# Patient Record
Sex: Female | Born: 1935
Health system: Southern US, Community
[De-identification: ages and names within clinical notes are randomized; demographics above are authoritative.]

## PROBLEM LIST (undated history)

## (undated) DIAGNOSIS — E785 Hyperlipidemia, unspecified: Secondary | ICD-10-CM

## (undated) DIAGNOSIS — M797 Fibromyalgia: Secondary | ICD-10-CM

## (undated) DIAGNOSIS — R519 Headache, unspecified: Secondary | ICD-10-CM

## (undated) DIAGNOSIS — F32A Depression, unspecified: Secondary | ICD-10-CM

## (undated) DIAGNOSIS — B019 Varicella without complication: Secondary | ICD-10-CM

## (undated) DIAGNOSIS — B059 Measles without complication: Secondary | ICD-10-CM

## (undated) DIAGNOSIS — E1142 Type 2 diabetes mellitus with diabetic polyneuropathy: Secondary | ICD-10-CM

## (undated) DIAGNOSIS — E1110 Type 2 diabetes mellitus with ketoacidosis without coma: Secondary | ICD-10-CM

## (undated) DIAGNOSIS — G894 Chronic pain syndrome: Secondary | ICD-10-CM

## (undated) DIAGNOSIS — T4145XA Adverse effect of unspecified anesthetic, initial encounter: Secondary | ICD-10-CM

## (undated) DIAGNOSIS — M1990 Unspecified osteoarthritis, unspecified site: Secondary | ICD-10-CM

## (undated) DIAGNOSIS — G629 Polyneuropathy, unspecified: Secondary | ICD-10-CM

## (undated) DIAGNOSIS — F329 Major depressive disorder, single episode, unspecified: Secondary | ICD-10-CM

## (undated) DIAGNOSIS — R0602 Shortness of breath: Secondary | ICD-10-CM

## (undated) DIAGNOSIS — E782 Mixed hyperlipidemia: Secondary | ICD-10-CM

## (undated) DIAGNOSIS — K219 Gastro-esophageal reflux disease without esophagitis: Secondary | ICD-10-CM

## (undated) DIAGNOSIS — M79644 Pain in right finger(s): Secondary | ICD-10-CM

## (undated) DIAGNOSIS — T8859XA Other complications of anesthesia, initial encounter: Secondary | ICD-10-CM

## (undated) DIAGNOSIS — C189 Malignant neoplasm of colon, unspecified: Secondary | ICD-10-CM

## (undated) DIAGNOSIS — I10 Essential (primary) hypertension: Secondary | ICD-10-CM

## (undated) DIAGNOSIS — T7840XA Allergy, unspecified, initial encounter: Secondary | ICD-10-CM

## (undated) DIAGNOSIS — Z9289 Personal history of other medical treatment: Secondary | ICD-10-CM

## (undated) DIAGNOSIS — R51 Headache: Secondary | ICD-10-CM

## (undated) DIAGNOSIS — M25511 Pain in right shoulder: Secondary | ICD-10-CM

## (undated) HISTORY — DX: Allergy, unspecified, initial encounter: T78.40XA

## (undated) HISTORY — DX: Measles without complication: B05.9

## (undated) HISTORY — DX: Unspecified osteoarthritis, unspecified site: M19.90

## (undated) HISTORY — DX: Malignant neoplasm of colon, unspecified: C18.9

## (undated) HISTORY — PX: COLON SURGERY: SHX602

## (undated) HISTORY — DX: Type 2 diabetes mellitus with ketoacidosis without coma: E11.10

## (undated) HISTORY — DX: Chronic pain syndrome: G89.4

## (undated) HISTORY — DX: Mixed hyperlipidemia: E78.2

## (undated) HISTORY — DX: Pain in right finger(s): M79.644

## (undated) HISTORY — DX: Fibromyalgia: M79.7

## (undated) HISTORY — DX: Varicella without complication: B01.9

## (undated) HISTORY — DX: Polyneuropathy, unspecified: G62.9

## (undated) HISTORY — DX: Pain in right shoulder: M25.511

## (undated) HISTORY — DX: Essential (primary) hypertension: I10

## (undated) HISTORY — DX: Type 2 diabetes mellitus with diabetic polyneuropathy: E11.42

## (undated) HISTORY — PX: CATARACT EXTRACTION: SUR2

## (undated) HISTORY — DX: Headache, unspecified: R51.9

## (undated) HISTORY — DX: Hyperlipidemia, unspecified: E78.5

## (undated) HISTORY — PX: OTHER SURGICAL HISTORY: SHX169

---

## 1998-06-15 ENCOUNTER — Other Ambulatory Visit: Admission: RE | Admit: 1998-06-15 | Discharge: 1998-06-15 | Payer: Self-pay | Admitting: Obstetrics and Gynecology

## 2000-01-01 LAB — HM PAP SMEAR: HM Pap smear: NORMAL

## 2000-08-01 ENCOUNTER — Encounter: Admission: RE | Admit: 2000-08-01 | Discharge: 2000-08-01 | Payer: Self-pay | Admitting: Internal Medicine

## 2000-08-01 ENCOUNTER — Encounter: Payer: Self-pay | Admitting: Internal Medicine

## 2001-08-06 ENCOUNTER — Encounter: Admission: RE | Admit: 2001-08-06 | Discharge: 2001-08-06 | Payer: Self-pay | Admitting: Internal Medicine

## 2001-08-06 ENCOUNTER — Encounter: Payer: Self-pay | Admitting: Internal Medicine

## 2001-08-25 ENCOUNTER — Other Ambulatory Visit: Admission: RE | Admit: 2001-08-25 | Discharge: 2001-08-25 | Payer: Self-pay | Admitting: Internal Medicine

## 2001-11-05 ENCOUNTER — Ambulatory Visit (HOSPITAL_COMMUNITY): Admission: RE | Admit: 2001-11-05 | Discharge: 2001-11-05 | Payer: Self-pay | Admitting: Gastroenterology

## 2002-08-10 ENCOUNTER — Encounter: Admission: RE | Admit: 2002-08-10 | Discharge: 2002-08-10 | Payer: Self-pay | Admitting: Internal Medicine

## 2002-08-10 ENCOUNTER — Encounter: Payer: Self-pay | Admitting: Internal Medicine

## 2002-08-18 ENCOUNTER — Encounter: Admission: RE | Admit: 2002-08-18 | Discharge: 2002-08-18 | Payer: Self-pay | Admitting: Internal Medicine

## 2002-08-18 ENCOUNTER — Encounter: Payer: Self-pay | Admitting: Internal Medicine

## 2003-08-12 ENCOUNTER — Encounter: Payer: Self-pay | Admitting: Internal Medicine

## 2003-08-12 ENCOUNTER — Encounter: Admission: RE | Admit: 2003-08-12 | Discharge: 2003-08-12 | Payer: Self-pay | Admitting: Internal Medicine

## 2004-08-17 ENCOUNTER — Encounter: Admission: RE | Admit: 2004-08-17 | Discharge: 2004-08-17 | Payer: Self-pay | Admitting: Internal Medicine

## 2005-08-23 ENCOUNTER — Encounter: Admission: RE | Admit: 2005-08-23 | Discharge: 2005-08-23 | Payer: Self-pay | Admitting: Family Medicine

## 2006-08-26 ENCOUNTER — Encounter: Admission: RE | Admit: 2006-08-26 | Discharge: 2006-08-26 | Payer: Self-pay | Admitting: Family Medicine

## 2007-08-28 ENCOUNTER — Encounter: Admission: RE | Admit: 2007-08-28 | Discharge: 2007-08-28 | Payer: Self-pay | Admitting: Family Medicine

## 2008-08-30 ENCOUNTER — Encounter: Admission: RE | Admit: 2008-08-30 | Discharge: 2008-08-30 | Payer: Self-pay | Admitting: Family Medicine

## 2009-08-10 ENCOUNTER — Encounter: Admission: RE | Admit: 2009-08-10 | Discharge: 2009-09-29 | Payer: Self-pay | Admitting: Family Medicine

## 2009-08-31 ENCOUNTER — Encounter: Admission: RE | Admit: 2009-08-31 | Discharge: 2009-08-31 | Payer: Self-pay | Admitting: Family Medicine

## 2009-09-07 ENCOUNTER — Encounter: Admission: RE | Admit: 2009-09-07 | Discharge: 2009-09-07 | Payer: Self-pay | Admitting: Family Medicine

## 2010-04-26 ENCOUNTER — Encounter: Payer: Self-pay | Admitting: Cardiology

## 2010-05-31 ENCOUNTER — Ambulatory Visit: Payer: Self-pay | Admitting: Cardiology

## 2010-05-31 DIAGNOSIS — I10 Essential (primary) hypertension: Secondary | ICD-10-CM

## 2010-05-31 DIAGNOSIS — R000 Tachycardia, unspecified: Secondary | ICD-10-CM

## 2010-05-31 DIAGNOSIS — R0602 Shortness of breath: Secondary | ICD-10-CM

## 2010-05-31 HISTORY — DX: Tachycardia, unspecified: R00.0

## 2010-05-31 HISTORY — DX: Essential (primary) hypertension: I10

## 2010-05-31 HISTORY — DX: Shortness of breath: R06.02

## 2010-06-20 ENCOUNTER — Encounter: Payer: Self-pay | Admitting: Cardiology

## 2010-06-20 ENCOUNTER — Ambulatory Visit: Payer: Self-pay | Admitting: Cardiology

## 2010-06-20 ENCOUNTER — Ambulatory Visit (HOSPITAL_COMMUNITY): Admission: RE | Admit: 2010-06-20 | Discharge: 2010-06-20 | Payer: Self-pay | Admitting: Cardiology

## 2010-06-20 ENCOUNTER — Ambulatory Visit: Payer: Self-pay | Admitting: Internal Medicine

## 2010-06-20 ENCOUNTER — Ambulatory Visit: Payer: Self-pay

## 2010-06-21 LAB — CONVERTED CEMR LAB: TSH: 2.07 microintl units/mL (ref 0.35–5.50)

## 2010-07-05 ENCOUNTER — Ambulatory Visit: Payer: Self-pay | Admitting: Cardiology

## 2010-09-12 ENCOUNTER — Encounter: Admission: RE | Admit: 2010-09-12 | Discharge: 2010-09-12 | Payer: Self-pay | Admitting: Family Medicine

## 2010-09-18 ENCOUNTER — Encounter: Admission: RE | Admit: 2010-09-18 | Discharge: 2010-09-18 | Payer: Self-pay | Admitting: Family Medicine

## 2010-09-20 ENCOUNTER — Encounter: Admission: RE | Admit: 2010-09-20 | Discharge: 2010-09-20 | Payer: Self-pay | Admitting: Family Medicine

## 2011-01-20 ENCOUNTER — Other Ambulatory Visit: Payer: Self-pay | Admitting: Family Medicine

## 2011-01-20 DIAGNOSIS — N6310 Unspecified lump in the right breast, unspecified quadrant: Secondary | ICD-10-CM

## 2011-01-22 ENCOUNTER — Encounter: Payer: Self-pay | Admitting: Family Medicine

## 2011-02-01 NOTE — Assessment & Plan Note (Signed)
Summary: [facility]   Visit Type:  Follow-up Primary Provider:  Belva Agee, NP  CC:  SOB and Chest Pain.  History of Present Illness: The patient presents for followup of dyspnea. This is described in the previous note. She was having episodes of dyspnea at night. She was describing a resting heart rate that was elevated and some tachycardia palpitations. She is no longer having shortness of breath at night. She has a rapid resting heart rate but no symptomatic tachycardia palpitations, presyncope or syncope. She is not describing PND or orthopnea. She does get some chest discomfort but this on today's description seems to be a chronic problem she has had for years with some burning in her upper epigastric area particularly at night. She has refused further cardiac workup. She did allow me to perform a TSH which was normal. I ordered an echocardiogram which demonstrated some mild diastolic dysfunction but normal systolic function. She had no valvular abnormalities.  Current Medications (verified): 1)  Metformin Hcl 500 Mg Tabs (Metformin Hcl) .Marland Kitchen.. 1 By Mouth Two Times A Day 2)  Omeprazole 20 Mg Cpdr (Omeprazole) .Marland Kitchen.. 1 By Mouth Daily 3)  Diovan Hct 160-12.5 Mg Tabs (Valsartan-Hydrochlorothiazide) .Marland Kitchen.. 1 By Mouth Daily 4)  Gabapentin 800 Mg Tabs (Gabapentin) .Marland Kitchen.. 1 By Mouth Three Times A Day 5)  Savella 50 Mg Tabs (Milnacipran Hcl) .Marland Kitchen.. 1 By Mouth Two Times A Day 6)  Valium 5 Mg Tabs (Diazepam) .Marland Kitchen.. 1 By Mouth As Needed 7)  Hydrocodone-Acetaminophen 5-500 Mg Tabs (Hydrocodone-Acetaminophen) .... As Needed  Allergies (verified): 1)  ! Penicillin  Past History:  Past Medical History: Reviewed history from 05/31/2010 and no changes required. Diabetes mellitus - 6 years Hypertension-15 years Hyperlipidemia-6 years Peripheral neuropathy Fibromyalgia Osteoarthritis Colon cancer  Past Surgical History: Reviewed history from 05/31/2010 and no changes required. Cataract  surgery Colon surgery for resection of cancer Nodule removed from her "throat"  Review of Systems       As stated in the HPI and negative for all other systems.   Vital Signs:  Patient profile:   77 year old female Height:      66 inches Weight:      142 pounds BMI:     23.00 Pulse rate:   98 / minute Resp:     18 per minute BP sitting:   156 / 92  (right arm)  Vitals Entered By: Marrion Coy, CNA (July 05, 2010 2:38 PM)  Physical Exam  General:  Well developed, well nourished, in no acute distress. Head:  normocephalic and atraumatic Neck:  Neck supple, no JVD. No masses, thyromegaly or abnormal cervical nodes. Chest Wall:  no deformities or breast masses noted Lungs:  Clear bilaterally to auscultation and percussion. Abdomen:  Bowel sounds positive; abdomen soft and non-tender without masses, organomegaly, or hernias noted. No hepatosplenomegaly. Msk:  Back normal, normal gait. Muscle strength and tone normal. Extremities:  No clubbing or cyanosis. Neurologic:  Alert and oriented x 3. Skin:  Intact without lesions or rashes. Psych:  Normal affect.   Detailed Cardiovascular Exam  Neck    Carotids: Carotids full and equal bilaterally without bruits.      Neck Veins: Normal, no JVD.    Heart    Inspection: no deformities or lifts noted.      Palpation: normal PMI with no thrills palpable.      Auscultation: regular rate and rhythm, S1, S2 without murmurs, rubs, gallops, or clicks.    Vascular  Abdominal Aorta: no palpable masses, pulsations, or audible bruits.      Femoral Pulses: normal femoral pulses bilaterally.      Pedal Pulses: normal pedal pulses bilaterally.      Radial Pulses: normal radial pulses bilaterally.      Peripheral Circulation: no clubbing, cyanosis, or edema noted with normal capillary refill.     Impression & Recommendations:  Problem # 1:  SHORTNESS OF BREATH (ICD-786.05) She is having no new episodic shortness of breath. The above  workup was normal. She does not want an ischemia evaluation. Therefore, no further workup is planned. I will suggest no change in her therapy. She will come back to see me if her symptoms recur or worsen.  Problem # 2:  UNSPECIFIED TACHYCARDIA (ICD-785.0) She has a resting sinus tachycardia but no apparent paroxysmal dysrhythmias. She's not having presyncope or syncope. Her thyroid study was normal. She is in pain and anxious which might account for some of her tachycardia. No further workup is suggested.  Patient Instructions: 1)  Your physician recommends that you schedule a follow-up appointment as needed 2)  Your physician recommends that you continue on your current medications as directed. Please refer to the Current Medication list given to you today.

## 2011-02-01 NOTE — Assessment & Plan Note (Signed)
Summary: [facility]   Visit Type:  Initial Consult Primary Provider:  Belva Agee, NP  CC:  palpitations.  History of Present Illness: The patient presents for evaluation of a rapid heart rate. She was noted recently on EKG to have sinus tachycardia with a heart rate of 124. She's been told for some years and her heart rate was elevated but never this high. She says that she doesn't notice this occasionally. She feel her heart beating fast but does not describe any irregular rhythm. She has not had any presyncope or syncope though she would occasionally be lightheaded. She does some activities such as vacuuming but does notice shortness of breath with this. She has had a slowly progressive fatigue with activity. She does describe 2 episodes of shortness of breath waking her at night. She sleeps with her head slightly elevated for treatment of reflux. She says that on 2 occasions she has woken with dyspnea and is able to weight-bear and let it slowly resolved. She does have some rare chest discomfort that is similar to previous reflux. This is sporadic and not associated with other symptoms. She can't bring this on. There is no associated nausea vomiting or diaphoresis.  Current Medications (verified): 1)  Metformin Hcl 500 Mg Tabs (Metformin Hcl) .Marland Kitchen.. 1 By Mouth Two Times A Day 2)  Omeprazole 20 Mg Cpdr (Omeprazole) .Marland Kitchen.. 1 By Mouth Daily 3)  Diovan Hct 160-12.5 Mg Tabs (Valsartan-Hydrochlorothiazide) .Marland Kitchen.. 1 By Mouth Daily 4)  Gabapentin 800 Mg Tabs (Gabapentin) .Marland Kitchen.. 1 By Mouth Three Times A Day 5)  Savella 50 Mg Tabs (Milnacipran Hcl) .Marland Kitchen.. 1 By Mouth Two Times A Day 6)  Valium 5 Mg Tabs (Diazepam) .Marland Kitchen.. 1 By Mouth As Needed 7)  Hydrocodone-Acetaminophen 5-500 Mg Tabs (Hydrocodone-Acetaminophen) .... As Needed  Allergies (verified): 1)  ! Penicillin  Past History:  Past Medical History: Diabetes mellitus - 6 years Hypertension-15 years Hyperlipidemia-6 years Peripheral  neuropathy Fibromyalgia Osteoarthritis Colon cancer  Past Surgical History: Cataract surgery Colon surgery for resection of cancer Nodule removed from her "throat"  Family History: Her father died at 66 with colorectal cancer, her mother died at 69 with brain cancer. There is no early heart disease history.  Social History: Patient does not smoke cigare She is retired. She is married and has one child.ttes or drink alcohol.  Review of Systems       Positive for headaches, constipation, reflux, urinary frequency. Negative for all other systems.  Vital Signs:  Patient profile:   77 year old female Height:      66 inches Weight:      141 pounds BMI:     22.84 Pulse rate:   108 / minute Resp:     16 per minute BP sitting:   150 / 88  (right arm)  Vitals Entered By: Marrion Coy, CNA (May 31, 2010 11:29 AM)  Physical Exam  General:  Well developed, well nourished, in no acute distress. Head:  normocephalic and atraumatic Eyes:  PERRLA/EOM intact; conjunctiva and lids normal. Mouth:  Oral mucosa normal. Neck:  Neck supple, no JVD. No masses, thyromegaly or abnormal cervical nodes. Chest Wall:  no deformities or breast masses noted Lungs:  Clear bilaterally to auscultation and percussion. Abdomen:  Bowel sounds positive; abdomen soft and non-tender without masses, organomegaly, or hernias noted. No hepatosplenomegaly. Msk:  Back normal, normal gait. Muscle strength and tone normal. Extremities:  No clubbing or cyanosis. Neurologic:  Alert and oriented x 3. Skin:  Intact  without lesions or rashes. Cervical Nodes:  no significant adenopathy Axillary Nodes:  no significant adenopathy Inguinal Nodes:  no significant adenopathy Psych:  Normal affect.   Detailed Cardiovascular Exam  Neck    Carotids: Carotids full and equal bilaterally without bruits.      Neck Veins: Normal, no JVD.    Heart    Inspection: no deformities or lifts noted.      Palpation: normal PMI with  no thrills palpable.      Auscultation: regular rate and rhythm, S1, S2 without murmurs, rubs, gallops, or clicks.    Vascular    Abdominal Aorta: no palpable masses, pulsations, or audible bruits.      Femoral Pulses: normal femoral pulses bilaterally.      Pedal Pulses: normal pedal pulses bilaterally.      Radial Pulses: normal radial pulses bilaterally.      Peripheral Circulation: no clubbing, cyanosis, or edema noted with normal capillary refill.     EKG  Procedure date:  05/31/2010  Findings:      Sinus rhythm, rate 124, right bundle branch block, we'll left anterior fascicular block, no acute ST-T wave changes  Impression & Recommendations:  Problem # 1:  UNSPECIFIED TACHYCARDIA (ICD-785.0) Patient has sinus tachycardia. She does have some symptoms related to this. I'm going to start checking a TSH and an echocardiogram to see if she . Orders: Echocardiogram (Echo)  Problem # 2:  ESSENTIAL HYPERTENSION, BENIGN (ICD-401.1) Her blood pressure is slightly elevated today. However, recent other office readings have been normal and I will make no change her regimen but would encourage home monitoring.  Problem # 3:  SHORTNESS OF BREATH (ICD-786.05) This will be evaluated in the context of evaluating her tachycardia. I will also consider a BNP level based on the results of the echo. Orders: Echocardiogram (Echo)  Patient Instructions: 1)  Your physician recommends that you schedule a follow-up appointment in: after 2 D Echo and TSH 2)  Your physician recommends that you continue on your current medications as directed. Please refer to the Current Medication list given to you today. 3)  Your physician has requested that you have an echocardiogram.  Echocardiography is a painless test that uses sound waves to create images of your heart. It provides your doctor with information about the size and shape of your heart and how well your heart's chambers and valves are working.  This  procedure takes approximately one hour. There are no restrictions for this procedure. 4)  Your physician recommends that you have lab work the same  day as 2 D Echo

## 2011-03-05 ENCOUNTER — Other Ambulatory Visit: Payer: Self-pay | Admitting: Family Medicine

## 2011-03-05 ENCOUNTER — Ambulatory Visit
Admission: RE | Admit: 2011-03-05 | Discharge: 2011-03-05 | Disposition: A | Discharge status: Home / Self Care | Payer: Medicare Other | Source: Ambulatory Visit | Attending: Family Medicine | Admitting: Family Medicine

## 2011-03-05 DIAGNOSIS — N6310 Unspecified lump in the right breast, unspecified quadrant: Secondary | ICD-10-CM

## 2011-03-17 ENCOUNTER — Encounter: Payer: Self-pay | Admitting: *Deleted

## 2011-03-17 DIAGNOSIS — E1142 Type 2 diabetes mellitus with diabetic polyneuropathy: Secondary | ICD-10-CM

## 2011-03-17 DIAGNOSIS — N189 Chronic kidney disease, unspecified: Secondary | ICD-10-CM

## 2011-03-17 DIAGNOSIS — E1110 Type 2 diabetes mellitus with ketoacidosis without coma: Secondary | ICD-10-CM | POA: Insufficient documentation

## 2011-03-17 DIAGNOSIS — Z85038 Personal history of other malignant neoplasm of large intestine: Secondary | ICD-10-CM

## 2011-03-17 DIAGNOSIS — N6099 Unspecified benign mammary dysplasia of unspecified breast: Secondary | ICD-10-CM

## 2011-03-17 DIAGNOSIS — M1990 Unspecified osteoarthritis, unspecified site: Secondary | ICD-10-CM

## 2011-03-17 DIAGNOSIS — E1140 Type 2 diabetes mellitus with diabetic neuropathy, unspecified: Secondary | ICD-10-CM

## 2011-03-17 DIAGNOSIS — K219 Gastro-esophageal reflux disease without esophagitis: Secondary | ICD-10-CM | POA: Insufficient documentation

## 2011-03-17 HISTORY — DX: Personal history of other malignant neoplasm of large intestine: Z85.038

## 2011-03-17 HISTORY — DX: Unspecified benign mammary dysplasia of unspecified breast: N60.99

## 2011-03-17 HISTORY — DX: Chronic kidney disease, unspecified: N18.9

## 2011-03-17 HISTORY — DX: Type 2 diabetes mellitus with diabetic polyneuropathy: E11.42

## 2011-03-17 HISTORY — DX: Type 2 diabetes mellitus with diabetic neuropathy, unspecified: E11.40

## 2011-03-17 HISTORY — DX: Unspecified osteoarthritis, unspecified site: M19.90

## 2011-03-17 HISTORY — DX: Type 2 diabetes mellitus with ketoacidosis without coma: E11.10

## 2011-05-18 NOTE — Procedures (Signed)
[facility]  Patient:    Dana Burns, Dana Burns Visit Number: [id number] MRN: [id number]          Service Type: Attending:  Verlin Grills, M.D. Dictated by:   Verlin Grills, M.D. Proc. Date: 11/05/01   CC:         Soyla Murphy. Renne Crigler, M.D.   Procedure Report  PROCEDURE:  Surveillance colonoscopy.  REFERRING PHYSICIAN:  Soyla Murphy. Renne Crigler, M.D.  INDICATIONS FOR PROCEDURE:  The patient (date of birth, March 27, 1927) is a 77 year old female.  On April 27, 1992, the patient underwent a segmental sigmoid colon resection to remove a Dukes B adenocarcinoma of the colon.  On August 24, 1993, proctocolonoscopy to the hepatic flexure followed by air contrast barium enema performed at [facility] were normal.  On November 10, 1996, flexible proctosigmoidoscopy followed by air contrast barium enema at the [facility] were normal.  ENDOSCOPIST:  Verlin Grills, M.D.  PREMEDICATION:  Versed 10 mg and Demerol 50 mg.  ENDOSCOPE:  Olympus pediatric colonoscope.  DESCRIPTION OF PROCEDURE:  After obtaining informed consent, the patient was placed in the left lateral decubitus position.  I administered intravenous Versed and intravenous Demerol to achieve conscious sedation for the procedure.  The patients blood pressure, oxygen saturation, and cardiac rhythm were monitored throughout the procedure and documented in the medical record.  Anal inspection was normal.  Digital rectal exam was normal.  The Olympus pediatric video colonoscope was introduced into the rectum and advanced to the cecum.  In order to advance the endoscope to the cecum, the patient was initially placed in the left lateral decubitus position, rolled to the supine position, and finally to the right lateral decubitus position which seemed to be the best position to advance the endoscope.  Colonic preparation for the exam today was excellent.  Rectum normal.   Rectosigmoid anastomosis normal.  Sigmoid colon and descending colon normal.  Splenic flexure normal.  Transverse colon normal.  Hepatic flexure normal.  Ascending colon normal.  Cecum and ileocecal valve normal.  ASSESSMENT:  Normal surveillance proctocolonoscopy to the cecum, post segmental sigmoid colon resection to remove a Dukes B adenocarcinoma of the colon in April 1993.  RECOMMENDATIONS:  Repeat colonoscopy in five years. Dictated by:   Verlin Grills, M.D. Attending:  Verlin Grills, M.D. DD:  11/05/01 TD:  11/06/01 Job: [id number]

## 2011-05-21 ENCOUNTER — Other Ambulatory Visit: Payer: Self-pay | Admitting: Family Medicine

## 2011-05-21 DIAGNOSIS — Z1231 Encounter for screening mammogram for malignant neoplasm of breast: Secondary | ICD-10-CM

## 2011-05-30 ENCOUNTER — Encounter (INDEPENDENT_AMBULATORY_CARE_PROVIDER_SITE_OTHER): Payer: Medicare Other

## 2011-05-30 DIAGNOSIS — I739 Peripheral vascular disease, unspecified: Secondary | ICD-10-CM

## 2011-08-07 ENCOUNTER — Encounter: Payer: Self-pay | Admitting: Cardiology

## 2011-09-24 ENCOUNTER — Ambulatory Visit
Admission: RE | Admit: 2011-09-24 | Discharge: 2011-09-24 | Disposition: A | Discharge status: Home / Self Care | Payer: Medicare Other | Source: Ambulatory Visit | Attending: Family Medicine | Admitting: Family Medicine

## 2011-09-24 DIAGNOSIS — Z1231 Encounter for screening mammogram for malignant neoplasm of breast: Secondary | ICD-10-CM

## 2012-01-01 HISTORY — PX: BREAST SURGERY: SHX581

## 2012-01-01 HISTORY — PX: BREAST EXCISIONAL BIOPSY: SUR124

## 2012-06-16 ENCOUNTER — Other Ambulatory Visit: Payer: Self-pay | Admitting: Family Medicine

## 2012-06-16 DIAGNOSIS — Z1231 Encounter for screening mammogram for malignant neoplasm of breast: Secondary | ICD-10-CM

## 2012-09-24 ENCOUNTER — Ambulatory Visit
Admission: RE | Admit: 2012-09-24 | Discharge: 2012-09-24 | Disposition: A | Discharge status: Home / Self Care | Payer: Medicare Other | Source: Ambulatory Visit | Attending: Family Medicine | Admitting: Family Medicine

## 2012-09-24 DIAGNOSIS — Z1231 Encounter for screening mammogram for malignant neoplasm of breast: Secondary | ICD-10-CM

## 2012-09-25 ENCOUNTER — Other Ambulatory Visit: Payer: Self-pay | Admitting: Family Medicine

## 2012-09-25 DIAGNOSIS — R928 Other abnormal and inconclusive findings on diagnostic imaging of breast: Secondary | ICD-10-CM

## 2012-09-30 ENCOUNTER — Ambulatory Visit
Admission: RE | Admit: 2012-09-30 | Discharge: 2012-09-30 | Disposition: A | Discharge status: Home / Self Care | Payer: Medicare Other | Source: Ambulatory Visit | Attending: Family Medicine | Admitting: Family Medicine

## 2012-09-30 DIAGNOSIS — R928 Other abnormal and inconclusive findings on diagnostic imaging of breast: Secondary | ICD-10-CM

## 2012-10-01 ENCOUNTER — Other Ambulatory Visit: Payer: Medicare Other

## 2012-10-14 ENCOUNTER — Encounter (INDEPENDENT_AMBULATORY_CARE_PROVIDER_SITE_OTHER): Payer: Self-pay | Admitting: Surgery

## 2012-10-14 ENCOUNTER — Ambulatory Visit (INDEPENDENT_AMBULATORY_CARE_PROVIDER_SITE_OTHER): Payer: Medicare Other | Admitting: Surgery

## 2012-10-14 ENCOUNTER — Other Ambulatory Visit (INDEPENDENT_AMBULATORY_CARE_PROVIDER_SITE_OTHER): Payer: Self-pay | Admitting: Surgery

## 2012-10-14 VITALS — BP 146/78 | HR 96 | Temp 98.4°F | Resp 16 | Ht 66.0 in | Wt 150.8 lb

## 2012-10-14 DIAGNOSIS — N6310 Unspecified lump in the right breast, unspecified quadrant: Secondary | ICD-10-CM

## 2012-10-14 DIAGNOSIS — N63 Unspecified lump in unspecified breast: Secondary | ICD-10-CM

## 2012-10-14 HISTORY — DX: Unspecified lump in the right breast, unspecified quadrant: N63.10

## 2012-10-14 NOTE — Progress Notes (Signed)
Patient ID: Dana Burns, female   DOB: 06/16/1926, 78 y.o.   MRN: [id number]  Chief Complaint  Patient presents with  . Other    eval Rt exvisional Bx    HPI Stephonie M Bhardwaj is a 78 y.o. female.  Referred by [facility]. 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  This was confirmed on core biopsy. The patient chose at that time not to have excision of the area.  She has been followed with mammogram and ultrasound. Recently a followup study showed that this area has enlarged slightly.  The mass is located in the right breast at 10:00 and measures 0.6 x 0.5 x 0.4 cm in diameter. This is located 3 cm outside the nipple. The to the enlarging nature of this mass she is now referred for surgical evaluation. The patient denies any pain, nipple drainage, nipple retraction, or palpable masses in the breast.  Past Medical History  Diagnosis Date  . Diabetes mellitus     6 years  . Hypertension     15 years  . Hyperlipidemia     6 years  . Peripheral neuropathy   . Fibromyalgia   . Osteoarthritis   . Colon cancer     Past Surgical History  Procedure Date  . Cataract extraction   . Colon surgery     For resection of cancer  . Nodule removed     From throat    Family History  Problem Relation Age of Onset  . Cancer Mother     Brain  . Cancer Father     Colorectal  . Heart disease Neg Hx     Early  . Cancer Sister     breast    Social History History  Substance Use Topics  . Smoking status: Never Smoker   . Smokeless tobacco: Not on file  . Alcohol Use: No    Allergies  Allergen Reactions  . Actos (Pioglitazone Hydrochloride)   . Buspar (Buspirone Hcl)   . Lipitor (Atorvastatin Calcium)   . Penicillins     Current Outpatient Prescriptions  Medication Sig Dispense Refill  . calcium  carbonate (OS-CAL) 600 MG TABS Take 600 mg by mouth 2 (two) times daily with a meal.        . Cholecalciferol (VITAMIN D) 400 UNITS capsule Take 400 Units by mouth 2 (two) times daily.        . diazepam (VALIUM) 5 MG tablet Take by mouth as needed. 1/2 to 1 tab prn daily as needed       . gabapentin (NEURONTIN) 800 MG tablet Take 800 mg by mouth 3 (three) times daily.        . HYDROcodone-acetaminophen (NORCO) 5-325 MG per tablet Take 1 tablet by mouth every 6 (six) hours as needed.        . ibuprofen (ADVIL,MOTRIN) 200 MG tablet Take 200 mg by mouth 4 (four) times daily as needed.        . metFORMIN (GLUCOPHAGE) 500 MG tablet Take 500 mg by mouth 2 (two) times daily with a meal.        . Milnacipran HCl (SAVELLA PO) Take by mouth 2 (two) times daily.        . Omega-3 Fatty Acids (FISH OIL) 1200 MG CAPS Take 1,200 mg by mouth 2 (two) times daily.        .   omeprazole (PRILOSEC OTC) 20 MG tablet Take 20 mg by mouth daily.        . omeprazole (PRILOSEC) 20 MG capsule       . pravastatin (PRAVACHOL) 40 MG tablet       . simvastatin (ZOCOR) 40 MG tablet Take 40 mg by mouth at bedtime.        . valsartan-hydrochlorothiazide (DIOVAN-HCT) 160-12.5 MG per tablet Take 1 tablet by mouth daily.          Review of Systems Review of Systems  Constitutional: Positive for chills. Negative for fever and unexpected weight change.  HENT: Positive for voice change. Negative for hearing loss, congestion, sore throat and trouble swallowing.   Eyes: Negative for visual disturbance.  Respiratory: Positive for cough and chest tightness. Negative for wheezing.   Cardiovascular: Positive for chest pain and leg swelling. Negative for palpitations.  Gastrointestinal: Positive for diarrhea. Negative for nausea, vomiting, abdominal pain, constipation, blood in stool, abdominal distention and anal bleeding.  Genitourinary: Negative for hematuria, vaginal bleeding and difficulty urinating.  Musculoskeletal: Positive for  arthralgias.  Skin: Negative for rash and wound.  Neurological: Positive for headaches. Negative for seizures and syncope.  Hematological: Negative for adenopathy. Does not bruise/bleed easily.  Psychiatric/Behavioral: Negative for confusion.    Blood pressure 146/78, pulse 96, temperature 98.4 F (36.9 C), temperature source Temporal, resp. rate 16, height 5' 6" (1.676 m), weight 150 lb 12.8 oz (68.402 kg).  Physical Exam Physical Exam Elderly female in NAD HEENT:  EOMI, sclera anicteric Neck:  No masses, no thyromegaly Lungs:  CTA bilaterally; normal respiratory effort Breasts - symmetric; no palpable masses; no nipple retraction or discharge; no axillary lymphadenopathy CV:  Regular rate and rhythm; no murmurs Abd:  +bowel sounds, soft, non-tender, no masses Ext:  Well-perfused; no edema Skin:  Warm, dry; no sign of jaundice  Data Reviewed *RADIOLOGY REPORT*  Clinical Data: Called back from screening mammography, enlarging  right breast mass. Prior ultrasound guided core needle biopsy  09/20/2010 revealed a sclerosing intraductal papilloma with  associated ductal hyperplasia.  DIGITAL DIAGNOSTIC RIGHT MAMMOGRAM AND RIGHT BREAST ULTRASOUND:  Comparison: Mammography 09/24/2012, 09/24/2011, dating back to  08/26/2006. Right breast ultrasound 09/18/2010.  Findings: Spot compression CC and MLO views of the right breast  were obtained. These demonstrate a circumscribed mass in the upper  outer right breast which has increased in size since the prior  mammograms from 2011 and 2012. A spring tissue marker clip is  present along the posterior margin of the mass, placed at the time  of the previous ultrasound guided core needle biopsy.  On physical exam, I palpate no mass in the upper outer right  breast.  Ultrasound is performed, showing a slightly hypoechoic nearly round  mass at the 10 o'clock position of the right breast, 3 cm from the  nipple, with the previously placed tissue  marker clip on the  posterior margin; this mass measures approximately 0.6 x 0.5 x 0.4  cm.  IMPRESSION:  Increase in size of the mass in the 10 o'clock position of the  right breast, 3 cm from the nipple, since the mammogram 1 year ago;  this mass corresponds to the previously biopsied sclerosing  papilloma, as the previously placed tissue marker clip is at the  posterior edge of the mass.  RECOMMENDATION:  Surgical excision of the enlarging papilloma was recommended to the  patient. She agreed with this plan. An appointment was scheduled  with Dr. Matt Harsha Yusko of [facility] on October 15 at  1:20 p.m.  The patient was encouraged to perform monthly self breast  examination and communicate any changes with her primary physician.  Findings and recommendations were discussed with the patient and  provided in written form at the time of the exam.  BI-RADS CATEGORY 4: Suspicious abnormality - biopsy should be  considered.  (The enlarging mass has previously been biopsied under ultrasound  and was shown to represent a sclerosing papilloma at that time).   Assessment    Enlarging right breast mass    Plan    Right needle-localized lumpectomy.  The surgical procedure has been discussed with the patient.  Potential risks, benefits, alternative treatments, and expected outcomes have been explained.  All of the patient's questions at this time have been answered.  The likelihood of reaching the patient's treatment goal is good.  The patient understand the proposed surgical procedure and wishes to proceed.        Halli Equihua K. 10/14/2012, 5:30 PM    

## 2012-10-15 ENCOUNTER — Telehealth (INDEPENDENT_AMBULATORY_CARE_PROVIDER_SITE_OTHER): Payer: Self-pay | Admitting: General Surgery

## 2012-10-15 NOTE — Telephone Encounter (Signed)
Patient daughter Sedalia Muta called in stated patient scheduled for surgery on 10/17/12, antibiotic was discussed during visit, but did not get a prescription written when they left yesterday. Can it be sent to pharmacy?  [name] said she can be called back at home # [phone].

## 2012-10-15 NOTE — Telephone Encounter (Signed)
The antibiotic discussed was the IV antibiotic to be given at the time of surgery.  No need for any prescriptions.

## 2012-10-16 ENCOUNTER — Encounter (HOSPITAL_COMMUNITY): Payer: Self-pay | Admitting: Pharmacy Technician

## 2012-10-16 ENCOUNTER — Telehealth (INDEPENDENT_AMBULATORY_CARE_PROVIDER_SITE_OTHER): Payer: Self-pay | Admitting: General Surgery

## 2012-10-16 ENCOUNTER — Other Ambulatory Visit (INDEPENDENT_AMBULATORY_CARE_PROVIDER_SITE_OTHER): Payer: Self-pay | Admitting: Surgery

## 2012-10-16 ENCOUNTER — Encounter (HOSPITAL_COMMUNITY): Payer: Self-pay

## 2012-10-16 MED ORDER — CEFAZOLIN SODIUM-DEXTROSE 2-3 GM-% IV SOLR
2.0000 g | INTRAVENOUS | Status: AC
  Administered 2012-10-17: 2 g via INTRAVENOUS
  Filled 2012-10-16: qty 50

## 2012-10-16 NOTE — Progress Notes (Signed)
Call fr. Debbie at [facility] [employer]. has been rescheduled for 0730, pt. Is aware.

## 2012-10-16 NOTE — Progress Notes (Signed)
Call to [facility], scheduler at [facility], reported pt. Will be SDW,she is currently scheduled for Needle Loc. At [facility]- at 0830, with 10:30 surgery.  In addition she will need extra  time tomorrow a.m. to get ready in [facility]  Eunice Blase will try to resch. Needle Loc. At [facility], for earlier.

## 2012-10-16 NOTE — Telephone Encounter (Signed)
Called [name] the (daughter) back to let her know that her mother will be getting IV antibiotic at the time of surgery

## 2012-10-16 NOTE — Progress Notes (Signed)
Call to [facility] - requested last ekg, cxr & last ov note, spoke with [facility]

## 2012-10-17 ENCOUNTER — Other Ambulatory Visit (INDEPENDENT_AMBULATORY_CARE_PROVIDER_SITE_OTHER): Payer: Self-pay | Admitting: Surgery

## 2012-10-17 ENCOUNTER — Ambulatory Visit
Admission: RE | Admit: 2012-10-17 | Discharge: 2012-10-17 | Disposition: A | Discharge status: Home / Self Care | Payer: Medicare Other | Source: Ambulatory Visit | Attending: Surgery | Admitting: Surgery

## 2012-10-17 ENCOUNTER — Encounter (HOSPITAL_COMMUNITY): Payer: Self-pay | Admitting: Certified Registered Nurse Anesthetist

## 2012-10-17 ENCOUNTER — Ambulatory Visit (HOSPITAL_COMMUNITY): Payer: Medicare Other | Admitting: Certified Registered Nurse Anesthetist

## 2012-10-17 ENCOUNTER — Ambulatory Visit (HOSPITAL_COMMUNITY)
Admission: RE | Admit: 2012-10-17 | Discharge: 2012-10-17 | Disposition: A | Discharge status: Home / Self Care | Payer: Medicare Other | Source: Ambulatory Visit | Attending: Surgery | Admitting: Surgery

## 2012-10-17 ENCOUNTER — Encounter (HOSPITAL_COMMUNITY)
Admission: RE | Disposition: A | Discharge status: Home / Self Care | Payer: Self-pay | Source: Ambulatory Visit | Attending: Surgery

## 2012-10-17 DIAGNOSIS — E119 Type 2 diabetes mellitus without complications: Secondary | ICD-10-CM | POA: Insufficient documentation

## 2012-10-17 DIAGNOSIS — I10 Essential (primary) hypertension: Secondary | ICD-10-CM | POA: Insufficient documentation

## 2012-10-17 DIAGNOSIS — N6310 Unspecified lump in the right breast, unspecified quadrant: Secondary | ICD-10-CM

## 2012-10-17 DIAGNOSIS — K219 Gastro-esophageal reflux disease without esophagitis: Secondary | ICD-10-CM | POA: Insufficient documentation

## 2012-10-17 DIAGNOSIS — D249 Benign neoplasm of unspecified breast: Secondary | ICD-10-CM | POA: Insufficient documentation

## 2012-10-17 HISTORY — DX: Other complications of anesthesia, initial encounter: T88.59XA

## 2012-10-17 HISTORY — DX: Headache: R51

## 2012-10-17 HISTORY — DX: Depression, unspecified: F32.A

## 2012-10-17 HISTORY — DX: Shortness of breath: R06.02

## 2012-10-17 HISTORY — DX: Major depressive disorder, single episode, unspecified: F32.9

## 2012-10-17 HISTORY — DX: Gastro-esophageal reflux disease without esophagitis: K21.9

## 2012-10-17 HISTORY — DX: Adverse effect of unspecified anesthetic, initial encounter: T41.45XA

## 2012-10-17 HISTORY — DX: Personal history of other medical treatment: Z92.89

## 2012-10-17 LAB — CBC
HCT: 34.7 % — ABNORMAL LOW (ref 36.0–46.0)
MCV: 81.1 fL (ref 78.0–100.0)
RDW: 15.9 % — ABNORMAL HIGH (ref 11.5–15.5)
WBC: 4.5 10*3/uL (ref 4.0–10.5)

## 2012-10-17 LAB — BASIC METABOLIC PANEL
BUN: 24 mg/dL — ABNORMAL HIGH (ref 6–23)
CO2: 28 mEq/L (ref 19–32)
Chloride: 104 mEq/L (ref 96–112)
Creatinine, Ser: 1.05 mg/dL (ref 0.50–1.10)
Glucose, Bld: 139 mg/dL — ABNORMAL HIGH (ref 70–99)

## 2012-10-17 LAB — SURGICAL PCR SCREEN: MRSA, PCR: NEGATIVE

## 2012-10-17 LAB — GLUCOSE, CAPILLARY: Glucose-Capillary: 136 mg/dL — ABNORMAL HIGH (ref 70–99)

## 2012-10-17 SURGERY — BREAST LUMPECTOMY WITH NEEDLE LOCALIZATION
Anesthesia: General | Site: Breast | Laterality: Right

## 2012-10-17 MED ORDER — OXYCODONE HCL 5 MG PO TABS
5.0000 mg | ORAL_TABLET | Freq: Once | ORAL | Status: AC | PRN
  Administered 2012-10-17: 5 mg via ORAL

## 2012-10-17 MED ORDER — BUPIVACAINE-EPINEPHRINE 0.25% -1:200000 IJ SOLN
INTRAMUSCULAR | Status: DC | PRN
  Administered 2012-10-17: 6 mL

## 2012-10-17 MED ORDER — LIDOCAINE HCL 1 % IJ SOLN
INTRAMUSCULAR | Status: DC | PRN
  Administered 2012-10-17: 60 mg via INTRADERMAL

## 2012-10-17 MED ORDER — ONDANSETRON HCL 4 MG/2ML IJ SOLN: 4.0000 mg | INTRAMUSCULAR | Status: DC | PRN

## 2012-10-17 MED ORDER — LACTATED RINGERS IV SOLN: INTRAVENOUS | Status: DC

## 2012-10-17 MED ORDER — MIDAZOLAM HCL 2 MG/2ML IJ SOLN: 1.0000 mg | INTRAMUSCULAR | Status: DC | PRN

## 2012-10-17 MED ORDER — HYDROCODONE-ACETAMINOPHEN 5-500 MG PO TABS
1.0000 | ORAL_TABLET | ORAL | Status: DC | PRN
Start: 2012-10-17 — End: 2013-09-03

## 2012-10-17 MED ORDER — FENTANYL CITRATE 0.05 MG/ML IJ SOLN
25.0000 ug | INTRAMUSCULAR | Status: DC | PRN
  Administered 2012-10-17 (×2): 50 ug via INTRAVENOUS

## 2012-10-17 MED ORDER — ACETAMINOPHEN 10 MG/ML IV SOLN
INTRAVENOUS | Status: DC | PRN
  Administered 2012-10-17: 1000 mg via INTRAVENOUS

## 2012-10-17 MED ORDER — BUPIVACAINE-EPINEPHRINE PF 0.25-1:200000 % IJ SOLN
INTRAMUSCULAR | Status: AC
  Filled 2012-10-17: qty 30

## 2012-10-17 MED ORDER — MUPIROCIN 2 % EX OINT
TOPICAL_OINTMENT | Freq: Once | CUTANEOUS | Status: AC
  Administered 2012-10-17: 1 via NASAL

## 2012-10-17 MED ORDER — MORPHINE SULFATE 2 MG/ML IJ SOLN: 2.0000 mg | INTRAMUSCULAR | Status: DC | PRN

## 2012-10-17 MED ORDER — OXYCODONE HCL 5 MG PO TABS
ORAL_TABLET | ORAL | Status: AC
  Filled 2012-10-17: qty 1

## 2012-10-17 MED ORDER — FENTANYL CITRATE 0.05 MG/ML IJ SOLN: 50.0000 ug | Freq: Once | INTRAMUSCULAR | Status: DC

## 2012-10-17 MED ORDER — MUPIROCIN 2 % EX OINT
TOPICAL_OINTMENT | CUTANEOUS | Status: AC
  Filled 2012-10-17: qty 22

## 2012-10-17 MED ORDER — ONDANSETRON HCL 4 MG/2ML IJ SOLN
INTRAMUSCULAR | Status: DC | PRN
  Administered 2012-10-17: 4 mg via INTRAVENOUS

## 2012-10-17 MED ORDER — CHLORHEXIDINE GLUCONATE 4 % EX LIQD: 1.0000 "application " | Freq: Once | CUTANEOUS | Status: DC

## 2012-10-17 MED ORDER — FENTANYL CITRATE 0.05 MG/ML IJ SOLN
INTRAMUSCULAR | Status: AC
  Filled 2012-10-17: qty 2

## 2012-10-17 MED ORDER — ACETAMINOPHEN 10 MG/ML IV SOLN
INTRAVENOUS | Status: AC
  Filled 2012-10-17: qty 100

## 2012-10-17 MED ORDER — OXYCODONE HCL 5 MG/5ML PO SOLN: 5.0000 mg | Freq: Once | ORAL | Status: AC | PRN

## 2012-10-17 MED ORDER — PROMETHAZINE HCL 25 MG/ML IJ SOLN: 6.2500 mg | INTRAMUSCULAR | Status: DC | PRN

## 2012-10-17 MED ORDER — PROPOFOL 10 MG/ML IV BOLUS
INTRAVENOUS | Status: DC | PRN
  Administered 2012-10-17: 160 mg via INTRAVENOUS

## 2012-10-17 MED ORDER — FENTANYL CITRATE 0.05 MG/ML IJ SOLN
INTRAMUSCULAR | Status: DC | PRN
  Administered 2012-10-17 (×2): 50 ug via INTRAVENOUS

## 2012-10-17 MED ORDER — HYDROCODONE-ACETAMINOPHEN 5-325 MG PO TABS: 1.0000 | ORAL_TABLET | ORAL | Status: DC | PRN

## 2012-10-17 MED ORDER — SODIUM CHLORIDE 0.9 % IR SOLN
Status: DC | PRN
  Administered 2012-10-17: 1

## 2012-10-17 MED ORDER — LACTATED RINGERS IV SOLN
INTRAVENOUS | Status: DC | PRN
  Administered 2012-10-17: 11:00:00 via INTRAVENOUS

## 2012-10-17 SURGICAL SUPPLY — 49 items
APL SKNCLS STERI-STRIP NONHPOA (GAUZE/BANDAGES/DRESSINGS) ×1
APPLIER CLIP 9.375 MED OPEN (MISCELLANEOUS)
APR CLP MED 9.3 20 MLT OPN (MISCELLANEOUS)
BENZOIN TINCTURE PRP APPL 2/3 (GAUZE/BANDAGES/DRESSINGS) ×2 IMPLANT
BINDER BREAST LRG (GAUZE/BANDAGES/DRESSINGS) IMPLANT
BINDER BREAST XLRG (GAUZE/BANDAGES/DRESSINGS) IMPLANT
BLADE SURG ROTATE 9660 (MISCELLANEOUS) IMPLANT
CHLORAPREP W/TINT 26ML (MISCELLANEOUS) ×2 IMPLANT
CLIP APPLIE 9.375 MED OPEN (MISCELLANEOUS) IMPLANT
CLIP TI WIDE RED SMALL 24 (CLIP) IMPLANT
CLOTH BEACON ORANGE TIMEOUT ST (SAFETY) ×2 IMPLANT
CLSR STERI-STRIP ANTIMIC 1/2X4 (GAUZE/BANDAGES/DRESSINGS) ×1 IMPLANT
CONT SPEC 4OZ CLIKSEAL STRL BL (MISCELLANEOUS) ×2 IMPLANT
COVER SURGICAL LIGHT HANDLE (MISCELLANEOUS) ×2 IMPLANT
DECANTER SPIKE VIAL GLASS SM (MISCELLANEOUS) IMPLANT
DEVICE DUBIN SPECIMEN MAMMOGRA (MISCELLANEOUS) IMPLANT
DRAPE LAPAROTOMY TRNSV 102X78 (DRAPE) ×2 IMPLANT
DRAPE UTILITY 15X26 W/TAPE STR (DRAPE) ×4 IMPLANT
DRSG TEGADERM 4X4.75 (GAUZE/BANDAGES/DRESSINGS) ×1 IMPLANT
ELECT REM PT RETURN 9FT ADLT (ELECTROSURGICAL) ×2
ELECTRODE REM PT RTRN 9FT ADLT (ELECTROSURGICAL) ×1 IMPLANT
GAUZE SPONGE 2X2 8PLY STRL LF (GAUZE/BANDAGES/DRESSINGS) IMPLANT
GAUZE SPONGE 4X4 16PLY XRAY LF (GAUZE/BANDAGES/DRESSINGS) ×2 IMPLANT
GLOVE BIO SURGEON STRL SZ 6.5 (GLOVE) ×1 IMPLANT
GLOVE BIO SURGEON STRL SZ7 (GLOVE) ×2 IMPLANT
GLOVE BIOGEL PI IND STRL 7.5 (GLOVE) ×1 IMPLANT
GLOVE BIOGEL PI INDICATOR 7.5 (GLOVE) ×2
GLOVE SURG SS PI 7.5 STRL IVOR (GLOVE) ×1 IMPLANT
GOWN STRL NON-REIN LRG LVL3 (GOWN DISPOSABLE) ×4 IMPLANT
KIT BASIN OR (CUSTOM PROCEDURE TRAY) ×2 IMPLANT
KIT MARKER MARGIN INK (KITS) IMPLANT
KIT ROOM TURNOVER OR (KITS) ×2 IMPLANT
NDL HYPO 25GX1X1/2 BEV (NEEDLE) ×1 IMPLANT
NEEDLE HYPO 25GX1X1/2 BEV (NEEDLE) ×2 IMPLANT
NS IRRIG 1000ML POUR BTL (IV SOLUTION) ×2 IMPLANT
PACK GENERAL/GYN (CUSTOM PROCEDURE TRAY) ×2 IMPLANT
PAD ARMBOARD 7.5X6 YLW CONV (MISCELLANEOUS) ×4 IMPLANT
SPECIMEN JAR MEDIUM (MISCELLANEOUS) ×2 IMPLANT
SPONGE GAUZE 2X2 STER 10/PKG (GAUZE/BANDAGES/DRESSINGS) ×1
SPONGE GAUZE 4X4 12PLY (GAUZE/BANDAGES/DRESSINGS) ×2 IMPLANT
STAPLER VISISTAT 35W (STAPLE) ×2 IMPLANT
STRIP CLOSURE SKIN 1/2X4 (GAUZE/BANDAGES/DRESSINGS) ×2 IMPLANT
SUT MNCRL AB 4-0 PS2 18 (SUTURE) ×2 IMPLANT
SUT VIC AB 3-0 SH 27 (SUTURE) ×2
SUT VIC AB 3-0 SH 27X BRD (SUTURE) ×1 IMPLANT
SYR CONTROL 10ML LL (SYRINGE) ×2 IMPLANT
TOWEL OR 17X24 6PK STRL BLUE (TOWEL DISPOSABLE) ×2 IMPLANT
TOWEL OR 17X26 10 PK STRL BLUE (TOWEL DISPOSABLE) ×2 IMPLANT
WATER STERILE IRR 1000ML POUR (IV SOLUTION) IMPLANT

## 2012-10-17 NOTE — H&P (View-Only) (Signed)
Patient ID: Dana Burns, female   DOB: May 06, 1926, 78 y.o.   MRN: [id number]  Chief Complaint  Patient presents with  . Other    eval Rt exvisional Bx    HPI Dana Burns is a 78 y.o. female.  Referred by [facility]. [facility] for evaluation of enlarging right breast mass HPI This is an 78 year old female who was initially diagnosed in 2011 with a sclerosing intraductal papilloma with usual ductal hyperplasia of the right breast.  This was confirmed on core biopsy. The patient chose at that time not to have excision of the area.  She has been followed with mammogram and ultrasound. Recently a followup study showed that this area has enlarged slightly.  The mass is located in the right breast at 10:00 and measures 0.6 x 0.5 x 0.4 cm in diameter. This is located 3 cm outside the nipple. The to the enlarging nature of this mass she is now referred for surgical evaluation. The patient denies any pain, nipple drainage, nipple retraction, or palpable masses in the breast.  Past Medical History  Diagnosis Date  . Diabetes mellitus     6 years  . Hypertension     15 years  . Hyperlipidemia     6 years  . Peripheral neuropathy   . Fibromyalgia   . Osteoarthritis   . Colon cancer     Past Surgical History  Procedure Date  . Cataract extraction   . Colon surgery     For resection of cancer  . Nodule removed     From throat    Family History  Problem Relation Age of Onset  . Cancer Mother     Brain  . Cancer Father     Colorectal  . Heart disease Neg Hx     Early  . Cancer Sister     breast    Social History History  Substance Use Topics  . Smoking status: Never Smoker   . Smokeless tobacco: Not on file  . Alcohol Use: No    Allergies  Allergen Reactions  . Actos (Pioglitazone Hydrochloride)   . Buspar (Buspirone Hcl)   . Lipitor (Atorvastatin Calcium)   . Penicillins     Current Outpatient Prescriptions  Medication Sig Dispense Refill  . calcium  carbonate (OS-CAL) 600 MG TABS Take 600 mg by mouth 2 (two) times daily with a meal.        . Cholecalciferol (VITAMIN D) 400 UNITS capsule Take 400 Units by mouth 2 (two) times daily.        . diazepam (VALIUM) 5 MG tablet Take by mouth as needed. 1/2 to 1 tab prn daily as needed       . gabapentin (NEURONTIN) 800 MG tablet Take 800 mg by mouth 3 (three) times daily.        Marland Kitchen HYDROcodone-acetaminophen (NORCO) 5-325 MG per tablet Take 1 tablet by mouth every 6 (six) hours as needed.        Marland Kitchen ibuprofen (ADVIL,MOTRIN) 200 MG tablet Take 200 mg by mouth 4 (four) times daily as needed.        . metFORMIN (GLUCOPHAGE) 500 MG tablet Take 500 mg by mouth 2 (two) times daily with a meal.        . Milnacipran HCl (SAVELLA PO) Take by mouth 2 (two) times daily.        . Omega-3 Fatty Acids (FISH OIL) 1200 MG CAPS Take 1,200 mg by mouth 2 (two) times daily.        Marland Kitchen  omeprazole (PRILOSEC OTC) 20 MG tablet Take 20 mg by mouth daily.        Marland Kitchen omeprazole (PRILOSEC) 20 MG capsule       . pravastatin (PRAVACHOL) 40 MG tablet       . simvastatin (ZOCOR) 40 MG tablet Take 40 mg by mouth at bedtime.        . valsartan-hydrochlorothiazide (DIOVAN-HCT) 160-12.5 MG per tablet Take 1 tablet by mouth daily.          Review of Systems Review of Systems  Constitutional: Positive for chills. Negative for fever and unexpected weight change.  HENT: Positive for voice change. Negative for hearing loss, congestion, sore throat and trouble swallowing.   Eyes: Negative for visual disturbance.  Respiratory: Positive for cough and chest tightness. Negative for wheezing.   Cardiovascular: Positive for chest pain and leg swelling. Negative for palpitations.  Gastrointestinal: Positive for diarrhea. Negative for nausea, vomiting, abdominal pain, constipation, blood in stool, abdominal distention and anal bleeding.  Genitourinary: Negative for hematuria, vaginal bleeding and difficulty urinating.  Musculoskeletal: Positive for  arthralgias.  Skin: Negative for rash and wound.  Neurological: Positive for headaches. Negative for seizures and syncope.  Hematological: Negative for adenopathy. Does not bruise/bleed easily.  Psychiatric/Behavioral: Negative for confusion.    Blood pressure 146/78, pulse 96, temperature 98.4 F (36.9 C), temperature source Temporal, resp. rate 16, height 5\' 6"  (1.676 m), weight 150 lb 12.8 oz (68.402 kg).  Physical Exam Physical Exam Elderly female in NAD HEENT:  EOMI, sclera anicteric Neck:  No masses, no thyromegaly Lungs:  CTA bilaterally; normal respiratory effort Breasts - symmetric; no palpable masses; no nipple retraction or discharge; no axillary lymphadenopathy CV:  Regular rate and rhythm; no murmurs Abd:  +bowel sounds, soft, non-tender, no masses Ext:  Well-perfused; no edema Skin:  Warm, dry; no sign of jaundice  Data Reviewed *RADIOLOGY REPORT*  Clinical Data: Called back from screening mammography, enlarging  right breast mass. Prior ultrasound guided core needle biopsy  09/20/2010 revealed a sclerosing intraductal papilloma with  associated ductal hyperplasia.  DIGITAL DIAGNOSTIC RIGHT MAMMOGRAM AND RIGHT BREAST ULTRASOUND:  Comparison: Mammography 09/24/2012, 09/24/2011, dating back to  08/26/2006. Right breast ultrasound 09/18/2010.  Findings: Spot compression CC and MLO views of the right breast  were obtained. These demonstrate a circumscribed mass in the upper  outer right breast which has increased in size since the prior  mammograms from 2011 and 2012. A spring tissue marker clip is  present along the posterior margin of the mass, placed at the time  of the previous ultrasound guided core needle biopsy.  On physical exam, I palpate no mass in the upper outer right  breast.  Ultrasound is performed, showing a slightly hypoechoic nearly round  mass at the 10 o'clock position of the right breast, 3 cm from the  nipple, with the previously placed tissue  marker clip on the  posterior margin; this mass measures approximately 0.6 x 0.5 x 0.4  cm.  IMPRESSION:  Increase in size of the mass in the 10 o'clock position of the  right breast, 3 cm from the nipple, since the mammogram 1 year ago;  this mass corresponds to the previously biopsied sclerosing  papilloma, as the previously placed tissue marker clip is at the  posterior edge of the mass.  RECOMMENDATION:  Surgical excision of the enlarging papilloma was recommended to the  patient. She agreed with this plan. An appointment was scheduled  with Dr. Marcille Blanco of [facility] on October 15 at  1:20 p.m.  The patient was encouraged to perform monthly self breast  examination and communicate any changes with her primary physician.  Findings and recommendations were discussed with the patient and  provided in written form at the time of the exam.  BI-RADS CATEGORY 4: Suspicious abnormality - biopsy should be  considered.  (The enlarging mass has previously been biopsied under ultrasound  and was shown to represent a sclerosing papilloma at that time).   Assessment    Enlarging right breast mass    Plan    Right needle-localized lumpectomy.  The surgical procedure has been discussed with the patient.  Potential risks, benefits, alternative treatments, and expected outcomes have been explained.  All of the patient's questions at this time have been answered.  The likelihood of reaching the patient's treatment goal is good.  The patient understand the proposed surgical procedure and wishes to proceed.        Bethene Hankinson K. 10/14/2012, 5:30 PM

## 2012-10-17 NOTE — Anesthesia Preprocedure Evaluation (Signed)
Anesthesia Evaluation  Patient identified by MRN, date of birth, ID band Patient awake    Reviewed: Allergy & Precautions, H&P , NPO status , Patient's Chart, lab work & pertinent test results  Airway Mallampati: I TM Distance: >3 FB Neck ROM: Full    Dental   Pulmonary shortness of breath,  breath sounds clear to auscultation        Cardiovascular hypertension, Rhythm:Regular Rate:Normal     Neuro/Psych  Headaches, Depression  Neuromuscular disease    GI/Hepatic GERD-  ,  Endo/Other  diabetes  Renal/GU Renal InsufficiencyRenal disease     Musculoskeletal  (+) Fibromyalgia -  Abdominal   Peds  Hematology   Anesthesia Other Findings   Reproductive/Obstetrics                           Anesthesia Physical Anesthesia Plan  ASA: III  Anesthesia Plan: General   Post-op Pain Management:    Induction: Intravenous  Airway Management Planned: LMA  Additional Equipment:   Intra-op Plan:   Post-operative Plan: Extubation in OR  Informed Consent: I have reviewed the patients History and Physical, chart, labs and discussed the procedure including the risks, benefits and alternatives for the proposed anesthesia with the patient or authorized representative who has indicated his/her understanding and acceptance.     Plan Discussed with: CRNA and Surgeon  Anesthesia Plan Comments:         Anesthesia Quick Evaluation

## 2012-10-17 NOTE — Preoperative (Signed)
Beta Blockers   Reason not to administer Beta Blockers:Not Applicable 

## 2012-10-17 NOTE — Progress Notes (Signed)
Pt. Reports that she feels like she has a frog in her throat. Pt. States ever since she had surgery on her throat 5 yrs. ago, she has had a hoarse voice.

## 2012-10-17 NOTE — Progress Notes (Signed)
Call to Dr. Corliss Skains, reported pt.'s recall of rash /w Penicillin.  Dr. Corliss Skains states Cefazolin for preop is OK.

## 2012-10-17 NOTE — Interval H&P Note (Signed)
History and Physical Interval Note:  10/17/2012 7:46 AM  Dana Burns  has presented today for surgery, with the diagnosis of right breast mass  The various methods of treatment have been discussed with the patient and family. After consideration of risks, benefits and other options for treatment, the patient has consented to  Procedure(s) (LRB) with comments: BREAST LUMPECTOMY WITH NEEDLE LOCALIZATION (Right) as a surgical intervention .  The patient's history has been reviewed, patient examined, no change in status, stable for surgery.  I have reviewed the patient's chart and labs.  Questions were answered to the patient's satisfaction.     Sukaina Toothaker K.

## 2012-10-17 NOTE — Op Note (Signed)
Pre-op Diagnosis - right breast mass Post-op diagnosis - same Procedure - right needle-localized lumpectomy Surgeon:  Izabella Marcantel K. Anesthesia - Gen-LMA Indications - 78 year old Female presents with a previously biopsied right breast mass. In 2011 this was diagnosed as a sclerosing intraductal papilloma. This area has enlarged slightly and the patient presents now for needle localized lumpectomy. A wire was placed this morning by radiology.  Description of procedure: The patient brought to the operating room placed in the supine position operating room table. After adequate level of general anesthesia was obtained the patient's right breast was prepped with chlor prep and draped in sterile fashion. We infiltrated around the wire with quarter percent Marcaine with epinephrine. I made an elliptical incision around the wire. Dissection was carried down the breast tissue with cautery. We raised a cylinder of tissue around the wire which was oriented in a medial to lateral direction. We excised the specimen and oriented with a paint kit. A specimen mammogram showed that the clip was within the specimen. The wire was intact. This was confirmed by radiology. We irrigated thoroughly. We inspected for hemostasis. Was closed with a deep layer 3-0 Vicryl and a subcuticular 4-0 Monocryl. Steri-Strips and clean dressings were applied. The patient was then extubated and brought to recovery in stable condition. All sponge, instrument, and needle counts are correct.  Wilmon Arms. Corliss Skains, MD, [facility]  10/17/2012 12:16 PM

## 2012-10-17 NOTE — Anesthesia Procedure Notes (Signed)
Procedure Name: LMA Insertion Date/Time: 10/17/2012 11:08 AM Performed by: Arlice Colt B Pre-anesthesia Checklist: Patient identified, Emergency Drugs available, Suction available, Patient being monitored and Timeout performed Patient Re-evaluated:Patient Re-evaluated prior to inductionOxygen Delivery Method: Circle system utilized Preoxygenation: Pre-oxygenation with 100% oxygen Intubation Type: IV induction LMA: LMA inserted LMA Size: 4.0 Number of attempts: 1 Placement Confirmation: positive ETCO2 and breath sounds checked- equal and bilateral Tube secured with: Tape Dental Injury: Teeth and Oropharynx as per pre-operative assessment

## 2012-10-17 NOTE — Anesthesia Postprocedure Evaluation (Signed)
  Anesthesia Post-op Note  Patient: Dana Burns  Procedure(s) Performed: Procedure(s) (LRB) with comments: BREAST LUMPECTOMY WITH NEEDLE LOCALIZATION (Right)  Patient Location: PACU  Anesthesia Type: General  Level of Consciousness: awake and alert   Airway and Oxygen Therapy: Patient Spontanous Breathing  Post-op Pain: mild  Post-op Assessment: Post-op Vital signs reviewed, Patient's Cardiovascular Status Stable, Respiratory Function Stable, Patent Airway, No signs of Nausea or vomiting and Pain level controlled  Post-op Vital Signs: stable  Complications: No apparent anesthesia complications

## 2012-10-17 NOTE — Transfer of Care (Signed)
Immediate Anesthesia Transfer of Care Note  Patient: Dana Burns  Procedure(s) Performed: Procedure(s) (LRB) with comments: BREAST LUMPECTOMY WITH NEEDLE LOCALIZATION (Right)  Patient Location: PACU  Anesthesia Type: General  Level of Consciousness: awake, alert  and oriented  Airway & Oxygen Therapy: Patient Spontanous Breathing and Patient connected to nasal cannula oxygen  Post-op Assessment: Report given to PACU RN and Post -op Vital signs reviewed and stable  Post vital signs: Reviewed and stable  Complications: No apparent anesthesia complications

## 2012-10-19 ENCOUNTER — Telehealth (INDEPENDENT_AMBULATORY_CARE_PROVIDER_SITE_OTHER): Payer: Self-pay | Admitting: General Surgery

## 2012-10-19 NOTE — Telephone Encounter (Signed)
Her daughter called to say that she had developed a rash wherever she had tape and the tape from the IV.  I recommended that she remove any tape and go ahead and shower.  I also recommended that she try a dose of benadryl to see if this might help with the itching as well. If no relief or improvement then call me back or come to ER for evaluation.

## 2012-10-20 ENCOUNTER — Telehealth (INDEPENDENT_AMBULATORY_CARE_PROVIDER_SITE_OTHER): Payer: Self-pay

## 2012-10-20 NOTE — Telephone Encounter (Signed)
This reaction may be due to the Chloraprep, which is the orange prep on the skin of the chest.  She should use soap and water and wash this very thoroughly, then she may use Cortisone ointment and benadryl.  These will probably work better if the Chloraprep is gone.

## 2012-10-20 NOTE — Telephone Encounter (Signed)
I notified the pt's daughter.

## 2012-10-20 NOTE — Telephone Encounter (Signed)
The pt had surgery Friday.  She developed a reaction to tape or cleanser.  She is red, itchy, and has whelps on her breast, chest, neck and face.  She has removed the bandage.  She called and spoke to Dr Biagio Quint last night who recommended Benadryl.  She has tried that and Cortisone cream with no relief.  She has no fever.  She has not changed any of her soaps or detergents.  She is taking her usual medicine except Hydrocodone but she has had that before.  Please advise.

## 2012-11-05 ENCOUNTER — Encounter (INDEPENDENT_AMBULATORY_CARE_PROVIDER_SITE_OTHER): Payer: Self-pay | Admitting: Surgery

## 2012-11-05 ENCOUNTER — Ambulatory Visit (INDEPENDENT_AMBULATORY_CARE_PROVIDER_SITE_OTHER): Payer: Medicare Other | Admitting: Surgery

## 2012-11-05 VITALS — BP 136/84 | HR 102 | Temp 96.8°F | Ht 66.0 in | Wt 150.8 lb

## 2012-11-05 DIAGNOSIS — N6310 Unspecified lump in the right breast, unspecified quadrant: Secondary | ICD-10-CM

## 2012-11-05 DIAGNOSIS — N63 Unspecified lump in unspecified breast: Secondary | ICD-10-CM

## 2012-11-05 NOTE — Progress Notes (Signed)
Status post right needle localized lumpectomy on 10/17/12. The pathology showed a sclerosing intraductal papilloma. There is no sign of malignancy or atypia. The incision is well healed. The patient has minimal soreness around the area. No sign of significant seroma or hematoma.  She may resume full activity. She should resume annual mammograms. Follow up with [place] as needed.  Wilmon Arms. Corliss Skains, MD, [facility]  11/05/2012 9:36 AM

## 2012-12-31 LAB — HM MAMMOGRAPHY: HM MAMMO: NORMAL

## 2013-05-29 ENCOUNTER — Other Ambulatory Visit (INDEPENDENT_AMBULATORY_CARE_PROVIDER_SITE_OTHER): Payer: Medicare Other

## 2013-05-29 DIAGNOSIS — E1159 Type 2 diabetes mellitus with other circulatory complications: Secondary | ICD-10-CM

## 2013-05-29 DIAGNOSIS — I10 Essential (primary) hypertension: Secondary | ICD-10-CM

## 2013-05-29 DIAGNOSIS — Z79899 Other long term (current) drug therapy: Secondary | ICD-10-CM

## 2013-05-29 DIAGNOSIS — E1059 Type 1 diabetes mellitus with other circulatory complications: Secondary | ICD-10-CM

## 2013-05-29 DIAGNOSIS — E785 Hyperlipidemia, unspecified: Secondary | ICD-10-CM

## 2013-05-29 LAB — BASIC METABOLIC PANEL WITH GFR
BUN: 15 mg/dL (ref 6–23)
CO2: 28 mEq/L (ref 19–32)
Calcium: 9.3 mg/dL (ref 8.4–10.5)
Chloride: 103 mEq/L (ref 96–112)
Creat: 0.94 mg/dL (ref 0.50–1.10)
GFR, Est African American: 64 mL/min
GFR, Est Non African American: 55 mL/min — ABNORMAL LOW
Glucose, Bld: 103 mg/dL — ABNORMAL HIGH (ref 70–99)
Potassium: 4.9 mEq/L (ref 3.5–5.3)
Sodium: 138 mEq/L (ref 135–145)

## 2013-05-29 LAB — HEPATIC FUNCTION PANEL
ALT: 10 U/L (ref 0–35)
AST: 14 U/L (ref 0–37)
Albumin: 4.3 g/dL (ref 3.5–5.2)
Alkaline Phosphatase: 58 U/L (ref 39–117)
Bilirubin, Direct: 0.1 mg/dL (ref 0.0–0.3)
Indirect Bilirubin: 0.2 mg/dL (ref 0.0–0.9)
Total Bilirubin: 0.3 mg/dL (ref 0.3–1.2)
Total Protein: 6.4 g/dL (ref 6.0–8.3)

## 2013-05-29 LAB — POCT GLYCOSYLATED HEMOGLOBIN (HGB A1C): Hemoglobin A1C: 6.1

## 2013-06-01 ENCOUNTER — Encounter: Payer: Self-pay | Admitting: Family Medicine

## 2013-06-01 ENCOUNTER — Ambulatory Visit (INDEPENDENT_AMBULATORY_CARE_PROVIDER_SITE_OTHER): Payer: Medicare Other | Admitting: Family Medicine

## 2013-06-01 VITALS — BP 154/81 | HR 94 | Temp 97.1°F | Wt 143.6 lb

## 2013-06-01 DIAGNOSIS — IMO0001 Reserved for inherently not codable concepts without codable children: Secondary | ICD-10-CM

## 2013-06-01 DIAGNOSIS — Z85038 Personal history of other malignant neoplasm of large intestine: Secondary | ICD-10-CM

## 2013-06-01 DIAGNOSIS — E1149 Type 2 diabetes mellitus with other diabetic neurological complication: Secondary | ICD-10-CM

## 2013-06-01 DIAGNOSIS — M797 Fibromyalgia: Secondary | ICD-10-CM

## 2013-06-01 DIAGNOSIS — E1140 Type 2 diabetes mellitus with diabetic neuropathy, unspecified: Secondary | ICD-10-CM

## 2013-06-01 DIAGNOSIS — F411 Generalized anxiety disorder: Secondary | ICD-10-CM

## 2013-06-01 DIAGNOSIS — Z733 Stress, not elsewhere classified: Secondary | ICD-10-CM

## 2013-06-01 DIAGNOSIS — E119 Type 2 diabetes mellitus without complications: Secondary | ICD-10-CM

## 2013-06-01 DIAGNOSIS — F439 Reaction to severe stress, unspecified: Secondary | ICD-10-CM

## 2013-06-01 DIAGNOSIS — K219 Gastro-esophageal reflux disease without esophagitis: Secondary | ICD-10-CM

## 2013-06-01 DIAGNOSIS — N189 Chronic kidney disease, unspecified: Secondary | ICD-10-CM

## 2013-06-01 DIAGNOSIS — E1142 Type 2 diabetes mellitus with diabetic polyneuropathy: Secondary | ICD-10-CM

## 2013-06-01 DIAGNOSIS — M1990 Unspecified osteoarthritis, unspecified site: Secondary | ICD-10-CM

## 2013-06-01 DIAGNOSIS — I10 Essential (primary) hypertension: Secondary | ICD-10-CM

## 2013-06-01 HISTORY — DX: Reaction to severe stress, unspecified: F43.9

## 2013-06-01 MED ORDER — GABAPENTIN 800 MG PO TABS: 800.0000 mg | ORAL_TABLET | Freq: Three times a day (TID) | ORAL | Status: DC

## 2013-06-01 MED ORDER — OMEPRAZOLE 20 MG PO CPDR: 20.0000 mg | DELAYED_RELEASE_CAPSULE | Freq: Two times a day (BID) | ORAL | Status: DC

## 2013-06-01 MED ORDER — VALSARTAN-HYDROCHLOROTHIAZIDE 160-12.5 MG PO TABS: 1.0000 | ORAL_TABLET | Freq: Every day | ORAL | Status: DC

## 2013-06-01 MED ORDER — MILNACIPRAN HCL 50 MG PO TABS: 50.0000 mg | ORAL_TABLET | Freq: Two times a day (BID) | ORAL | Status: DC

## 2013-06-01 MED ORDER — DIAZEPAM 5 MG PO TABS: 2.5000 mg | ORAL_TABLET | Freq: Two times a day (BID) | ORAL | Status: DC | PRN

## 2013-06-01 NOTE — Progress Notes (Signed)
Patient ID: Dana Burns, female   DOB: 11-02-1926, 79 y.o.   MRN: [id number] SUBJECTIVE: HPI: Patient is here for follow up of Diabetes Mellitus/hypertension/: Symptoms of DM: Denies Nocturia ,Denies Urinary Frequency , denies Blurred vision ,deniesDizziness,denies.Dysuria,denies paresthesias, denies extremity pain or ulcers.Marland Kitchendenies chest pain. has had an annual eye exam. do check the feet. Does check CBGs. Average CBG: fluctuates but usually good at 100 to 110 Denies episodes of hypoglycemia. Does have an emergency hypoglycemic plan. admits toCompliance with medications. Denies Problems with medications.  Fibromyalgia: no change with increase in savella.. It is expensive and would like to reduce dose back to one twice a day. Continues to ache all over. Stress is high. Moving back to [employer].   PMH/PSH: reviewed/updated in Epic  SH/FH: reviewed/updated in Epic  Allergies: reviewed/updated in Epic  Medications: reviewed/updated in Epic  Immunizations: reviewed/updated in Epic  ROS: As above in the HPI. All other systems are stable or negative.  OBJECTIVE: APPEARANCE:  Patient in no acute distress.The patient appeared well nourished and normally developed. Acyanotic. Waist: VITAL SIGNS:BP 154/81  Pulse 94  Temp(Src) 97.1 F (36.2 C) (Oral)  Wt 143 lb 9.6 oz (65.137 kg)  BMI 23.19 kg/m2 WF  SKIN: warm and  Dry without overt rashes, tattoos and scars.  HEAD and Neck: without JVD, Head and scalp: normal Eyes:No scleral icterus. Fundi normal, eye movements normal. Ears: Auricle normal, canal normal, Tympanic membranes normal, insufflation normal. Nose: normal Throat: normal Neck & thyroid: normal  CHEST & LUNGS: Chest wall: normal Lungs: Clear  CVS: Reveals the PMI to be normally located. Regular rhythm, First and Second Heart sounds are normal,  absence of murmurs, rubs or gallops. Peripheral vasculature: Radial pulses: normal Dorsal pedis pulses:  normal Posterior pulses: normal  ABDOMEN:  Appearance: normal Benign,, no organomegaly, no masses, no Abdominal Aortic enlargement. No Guarding , no rebound. No Bruits. Bowel sounds: normal  RECTAL: N/A GU: N/A  EXTREMETIES: nonedematous. Both Femoral and Pedal pulses are normal.  MUSCULOSKELETAL:  Spine:kyphosis Patient c/o arthralgias.  NEUROLOGIC: oriented to time,place and person; nonfocal. Strength is normal Sensory is normal Reflexes are normal Cranial Nerves are normal.  Results for orders placed in visit on 05/29/13  POCT GLYCOSYLATED HEMOGLOBIN (HGB A1C)      Result Value Range   Hemoglobin A1C 6.1%     Rest of labs pending  ASSESSMENT: CKD (chronic kidney disease), unspecified stage  Diabetes mellitus  Diabetic neuropathy - Plan: gabapentin (NEURONTIN) 800 MG tablet  Essential hypertension, benign - Plan: valsartan-hydrochlorothiazide (DIOVAN-HCT) 160-12.5 MG per tablet  GERD (gastroesophageal reflux disease) - Plan: omeprazole (PRILOSEC) 20 MG capsule  History of colon cancer  Osteoarthritis (arthritis due to wear and tear of joints)  Fibromyalgia - Plan: Milnacipran (SAVELLA) 50 MG TABS  Stress  Anxiety state, unspecified - Plan: diazepam (VALIUM) 5 MG tablet  Await labs.  PLAN: No orders of the defined types were placed in this encounter.   Meds ordered this encounter  Medications  . diazepam (VALIUM) 5 MG tablet    Sig: Take 0.5-1 tablets (2.5-5 mg total) by mouth 2 (two) times daily as needed. anxiety    Dispense:  30 tablet    Refill:  0  . gabapentin (NEURONTIN) 800 MG tablet    Sig: Take 1 tablet (800 mg total) by mouth 3 (three) times daily.    Dispense:  90 tablet    Refill:  3  . omeprazole (PRILOSEC) 20 MG capsule    Sig: Take 1  capsule (20 mg total) by mouth 2 (two) times daily.    Dispense:  30 capsule    Refill:  5  . Milnacipran (SAVELLA) 50 MG TABS    Sig: Take 1 tablet (50 mg total) by mouth 2 (two) times daily.     Dispense:  60 tablet    Refill:  5  . valsartan-hydrochlorothiazide (DIOVAN-HCT) 160-12.5 MG per tablet    Sig: Take 1 tablet by mouth daily.    Dispense:  30 tablet    Refill:  5  diet and exercise discussed. Stress reduction  Return in about 3 months (around 09/01/2013) for Recheck medical problems.  Kaitlynd Phillips P. [place] Charon, M.D.

## 2013-06-02 LAB — NMR LIPOPROFILE WITH LIPIDS
Cholesterol, Total: 148 mg/dL (ref <200)
HDL Particle Number: 35.2 umol/L (ref >=30.5)
HDL Size: 8.7 nm — ABNORMAL LOW (ref >=9.2)
HDL-C: 48 mg/dL (ref >=40)
LDL (calc): 73 mg/dL (ref <100)
LDL Particle Number: 1333 nmol/L — ABNORMAL HIGH (ref <1000)
LDL Size: 20.4 nm — ABNORMAL LOW (ref >20.5)
LP-IR Score: 55 — ABNORMAL HIGH (ref <=45)
Large HDL-P: 3.6 umol/L — ABNORMAL LOW (ref >=4.8)
Large VLDL-P: 1.9 nmol/L (ref <=2.7)
Small LDL Particle Number: 682 nmol/L — ABNORMAL HIGH (ref <=527)
Triglycerides: 133 mg/dL (ref <150)
VLDL Size: 44.3 nm (ref <=46.6)

## 2013-06-02 NOTE — Progress Notes (Signed)
Quick Note:  Lab result at goal. No change in Medications for now. No Change in plans and follow up. ______ 

## 2013-06-12 ENCOUNTER — Other Ambulatory Visit: Payer: Self-pay | Admitting: Family Medicine

## 2013-06-25 ENCOUNTER — Other Ambulatory Visit: Payer: Self-pay | Admitting: Family Medicine

## 2013-07-14 ENCOUNTER — Other Ambulatory Visit: Payer: Self-pay

## 2013-07-14 DIAGNOSIS — Z1231 Encounter for screening mammogram for malignant neoplasm of breast: Secondary | ICD-10-CM

## 2013-07-27 ENCOUNTER — Other Ambulatory Visit: Payer: Self-pay

## 2013-07-27 MED ORDER — PRAVASTATIN SODIUM 40 MG PO TABS: 40.0000 mg | ORAL_TABLET | Freq: Every day | ORAL | Status: DC

## 2013-09-01 ENCOUNTER — Other Ambulatory Visit (INDEPENDENT_AMBULATORY_CARE_PROVIDER_SITE_OTHER): Payer: Medicare Other

## 2013-09-01 DIAGNOSIS — IMO0001 Reserved for inherently not codable concepts without codable children: Secondary | ICD-10-CM

## 2013-09-01 DIAGNOSIS — E785 Hyperlipidemia, unspecified: Secondary | ICD-10-CM

## 2013-09-01 DIAGNOSIS — Z79899 Other long term (current) drug therapy: Secondary | ICD-10-CM

## 2013-09-01 DIAGNOSIS — R5381 Other malaise: Secondary | ICD-10-CM

## 2013-09-01 LAB — POCT GLYCOSYLATED HEMOGLOBIN (HGB A1C): Hemoglobin A1C: 5.9

## 2013-09-02 LAB — BMP8+EGFR
BUN/Creatinine Ratio: 15 (ref 11–26)
BUN: 14 mg/dL (ref 8–27)
CO2: 26 mmol/L (ref 18–29)
Calcium: 10 mg/dL (ref 8.6–10.2)
Chloride: 101 mmol/L (ref 97–108)
Creatinine, Ser: 0.95 mg/dL (ref 0.57–1.00)
GFR calc Af Amer: 63 mL/min/{1.73_m2} (ref >59)
GFR calc non Af Amer: 55 mL/min/{1.73_m2} — ABNORMAL LOW (ref >59)
Glucose: 121 mg/dL — ABNORMAL HIGH (ref 65–99)
Potassium: 4.8 mmol/L (ref 3.5–5.2)
Sodium: 143 mmol/L (ref 134–144)

## 2013-09-02 LAB — NMR, LIPOPROFILE
Cholesterol: 175 mg/dL (ref <200)
HDL Cholesterol by NMR: 50 mg/dL (ref >=40)
HDL Particle Number: 35.5 umol/L (ref >=30.5)
LDL Particle Number: 1823 nmol/L — ABNORMAL HIGH (ref <1000)
LDL Size: 20 nm — ABNORMAL LOW (ref >20.5)
LDLC SERPL CALC-MCNC: 103 mg/dL — ABNORMAL HIGH (ref <100)
LP-IR Score: 62 — ABNORMAL HIGH (ref <=45)
Small LDL Particle Number: 1312 nmol/L — ABNORMAL HIGH (ref <=527)
Triglycerides by NMR: 112 mg/dL (ref <150)

## 2013-09-02 LAB — HEPATIC FUNCTION PANEL
ALT: 7 IU/L (ref 0–32)
AST: 15 IU/L (ref 0–40)
Albumin: 4.3 g/dL (ref 3.5–4.7)
Alkaline Phosphatase: 66 IU/L (ref 39–117)
Bilirubin, Direct: 0.07 mg/dL (ref 0.00–0.40)
Total Bilirubin: 0.2 mg/dL (ref 0.0–1.2)
Total Protein: 6.6 g/dL (ref 6.0–8.5)

## 2013-09-03 ENCOUNTER — Ambulatory Visit (INDEPENDENT_AMBULATORY_CARE_PROVIDER_SITE_OTHER): Payer: Medicare Other | Admitting: Family Medicine

## 2013-09-03 ENCOUNTER — Encounter: Payer: Self-pay | Admitting: Family Medicine

## 2013-09-03 VITALS — BP 135/67 | HR 100 | Temp 97.1°F | Wt 133.0 lb

## 2013-09-03 DIAGNOSIS — M1990 Unspecified osteoarthritis, unspecified site: Secondary | ICD-10-CM

## 2013-09-03 DIAGNOSIS — Z733 Stress, not elsewhere classified: Secondary | ICD-10-CM

## 2013-09-03 DIAGNOSIS — M25569 Pain in unspecified knee: Secondary | ICD-10-CM

## 2013-09-03 DIAGNOSIS — M797 Fibromyalgia: Secondary | ICD-10-CM

## 2013-09-03 DIAGNOSIS — E1149 Type 2 diabetes mellitus with other diabetic neurological complication: Secondary | ICD-10-CM

## 2013-09-03 DIAGNOSIS — K219 Gastro-esophageal reflux disease without esophagitis: Secondary | ICD-10-CM

## 2013-09-03 DIAGNOSIS — IMO0001 Reserved for inherently not codable concepts without codable children: Secondary | ICD-10-CM

## 2013-09-03 DIAGNOSIS — E119 Type 2 diabetes mellitus without complications: Secondary | ICD-10-CM

## 2013-09-03 DIAGNOSIS — E782 Mixed hyperlipidemia: Secondary | ICD-10-CM | POA: Insufficient documentation

## 2013-09-03 DIAGNOSIS — R0602 Shortness of breath: Secondary | ICD-10-CM

## 2013-09-03 DIAGNOSIS — F439 Reaction to severe stress, unspecified: Secondary | ICD-10-CM

## 2013-09-03 DIAGNOSIS — N189 Chronic kidney disease, unspecified: Secondary | ICD-10-CM

## 2013-09-03 DIAGNOSIS — I10 Essential (primary) hypertension: Secondary | ICD-10-CM

## 2013-09-03 DIAGNOSIS — Z85038 Personal history of other malignant neoplasm of large intestine: Secondary | ICD-10-CM

## 2013-09-03 DIAGNOSIS — E1140 Type 2 diabetes mellitus with diabetic neuropathy, unspecified: Secondary | ICD-10-CM

## 2013-09-03 DIAGNOSIS — E785 Hyperlipidemia, unspecified: Secondary | ICD-10-CM

## 2013-09-03 DIAGNOSIS — R000 Tachycardia, unspecified: Secondary | ICD-10-CM

## 2013-09-03 DIAGNOSIS — F411 Generalized anxiety disorder: Secondary | ICD-10-CM | POA: Insufficient documentation

## 2013-09-03 DIAGNOSIS — E1142 Type 2 diabetes mellitus with diabetic polyneuropathy: Secondary | ICD-10-CM

## 2013-09-03 HISTORY — DX: Generalized anxiety disorder: F41.1

## 2013-09-03 HISTORY — DX: Pain in unspecified knee: M25.569

## 2013-09-03 MED ORDER — METFORMIN HCL 500 MG PO TABS: 500.0000 mg | ORAL_TABLET | Freq: Two times a day (BID) | ORAL | Status: DC

## 2013-09-03 MED ORDER — DIAZEPAM 5 MG PO TABS: 2.5000 mg | ORAL_TABLET | Freq: Two times a day (BID) | ORAL | Status: DC | PRN

## 2013-09-03 MED ORDER — GABAPENTIN 800 MG PO TABS: 800.0000 mg | ORAL_TABLET | Freq: Three times a day (TID) | ORAL | Status: DC

## 2013-09-03 NOTE — Progress Notes (Signed)
Patient ID: Dana Burns, female   DOB: 1926/06/26, 79 y.o.   MRN: [id number] SUBJECTIVE: CC: Chief Complaint  Patient presents with  . Follow-up    3 month losing weight weight loss under "stress" needs refills and wants something for pain     HPI: Moved to [place] to live and it has been stressful and lost weight from the stress. Came for  Routine follow up. Having claudication type leg pains.walking a lot   Past Medical History  Diagnosis Date  . Diabetes mellitus     6 years  . Peripheral neuropathy   . Fibromyalgia   . Colon cancer   . Complication of anesthesia     agitated when waking up, wakes up shaking, "like  I'm going into shock"  . Hypertension     15 years, reports she has a rapid heartrate   . Depression     husband died  3 months ago, pt. admits depression  currently   . Shortness of breath   . GERD (gastroesophageal reflux disease)   . Headache(784.0)     occasional - "bad headaches"  . Osteoarthritis     in hands & all over, feet  . H/O echocardiogram     done /w [place] in 2011, saw Dr. Antoine Poche for tachycardia   . Hyperlipidemia     6 years   Past Surgical History  Procedure Laterality Date  . Cataract extraction      /w IOL- bilateral   . Colon surgery      For resection of cancer  . Nodule removed      From throat   History   Social History  . Marital Status: Widowed    Spouse Name: N/A    Number of Children: 1  . Years of Education: N/A   Occupational History  . Retired    Social History Main Topics  . Smoking status: Never Smoker   . Smokeless tobacco: Not on file  . Alcohol Use: No  . Drug Use: No  . Sexual Activity: Not on file   Other Topics Concern  . Not on file   Social History Narrative  . No narrative on file   Family History  Problem Relation Age of Onset  . Cancer Mother     Brain  . Cancer Father     Colorectal  . Heart disease Neg Hx     Early  . Cancer Sister     breast   Current Outpatient  Prescriptions on File Prior to Visit  Medication Sig Dispense Refill  . calcium carbonate (OS-CAL) 600 MG TABS Take 600 mg by mouth 2 (two) times daily with a meal.        . Cholecalciferol (VITAMIN D3) 5000 UNITS CAPS Take 5,000 Units by mouth daily.      . Milnacipran (SAVELLA) 50 MG TABS Take 1 tablet (50 mg total) by mouth 2 (two) times daily.  60 tablet  5  . Omega-3 Fatty Acids (FISH OIL) 1200 MG CAPS Take 1,200 mg by mouth 2 (two) times daily.        Marland Kitchen omeprazole (PRILOSEC) 20 MG capsule Take 1 capsule (20 mg total) by mouth 2 (two) times daily.  30 capsule  5  . pravastatin (PRAVACHOL) 40 MG tablet Take 1 tablet (40 mg total) by mouth at bedtime.  30 tablet  4  . valsartan-hydrochlorothiazide (DIOVAN-HCT) 160-12.5 MG per tablet Take 1 tablet by mouth daily.  30 tablet  5  No current facility-administered medications on file prior to visit.   Allergies  Allergen Reactions  . Tape Itching and Rash  . Actos [Pioglitazone Hydrochloride]   . Buspar [Buspirone Hcl]   . Lipitor [Atorvastatin Calcium]   . Penicillins Rash   Immunization History  Administered Date(s) Administered  . Influenza Whole 09/30/2010  . Td 01/01/2004  . Tdap 10/17/2011   Prior to Admission medications   Medication Sig Start Date End Date Taking? Authorizing Provider  calcium carbonate (OS-CAL) 600 MG TABS Take 600 mg by mouth 2 (two) times daily with a meal.     Yes Historical Provider, MD  Cholecalciferol (VITAMIN D3) 5000 UNITS CAPS Take 5,000 Units by mouth daily.   Yes Historical Provider, MD  diazepam (VALIUM) 5 MG tablet Take 0.5-1 tablets (2.5-5 mg total) by mouth 2 (two) times daily as needed. anxiety 09/03/13  Yes Ileana Ladd, MD  gabapentin (NEURONTIN) 800 MG tablet Take 1 tablet (800 mg total) by mouth 3 (three) times daily. 09/03/13  Yes Ileana Ladd, MD  metFORMIN (GLUCOPHAGE) 500 MG tablet Take 1 tablet (500 mg total) by mouth 2 (two) times daily with a meal. 09/03/13   Ileana Ladd, MD   Milnacipran (SAVELLA) 50 MG TABS Take 1 tablet (50 mg total) by mouth 2 (two) times daily. 06/01/13   Ileana Ladd, MD  Omega-3 Fatty Acids (FISH OIL) 1200 MG CAPS Take 1,200 mg by mouth 2 (two) times daily.      Historical Provider, MD  omeprazole (PRILOSEC) 20 MG capsule Take 1 capsule (20 mg total) by mouth 2 (two) times daily. 06/01/13   Ileana Ladd, MD  pravastatin (PRAVACHOL) 40 MG tablet Take 1 tablet (40 mg total) by mouth at bedtime. 07/27/13   Ernestina Penna, MD  valsartan-hydrochlorothiazide (DIOVAN-HCT) 160-12.5 MG per tablet Take 1 tablet by mouth daily. 06/01/13   Ileana Ladd, MD      ROS: As above in the HPI. All other systems are stable or negative.  OBJECTIVE: APPEARANCE:  Patient in no acute distress.The patient appeared well nourished and normally developed. Acyanotic. Waist: VITAL SIGNS:BP 135/67  Pulse 100  Temp(Src) 97.1 F (36.2 C) (Oral)  Wt 133 lb (60.328 kg)  BMI 21.48 kg/m2 Patient lost 10 lbs  Since last visit.  WF  SKIN: warm and  Dry without overt rashes, tattoos and scars  HEAD and Neck: without JVD, Head and scalp: normal Eyes:No scleral icterus. Fundi normal, eye movements normal. Ears: Auricle normal, canal normal, Tympanic membranes normal, insufflation normal. Nose: normal Throat: normal Neck & thyroid: normal  CHEST & LUNGS: Chest wall: normal Lungs: Clear  CVS: Reveals the PMI to be normally located. Regular rhythm, First and Second Heart sounds are normal,  absence of murmurs, rubs or gallops. Peripheral vasculature: Radial pulses: normal Dorsal pedis pulses: absent bilaterally Posterior pulses: reduced  ABDOMEN:  Appearance: normal Benign, no organomegaly, no masses, no Abdominal Aortic enlargement. No Guarding , no rebound. No Bruits. Bowel sounds: normal  RECTAL: N/A GU: N/A  EXTREMETIES: nonedematous.  MUSCULOSKELETAL:  Spine: reduced ROM with pain. SLR mildly positive. Joints: intact  NEUROLOGIC: oriented  to time,place and person; nonfocal. Strength is normal Sensory is normal Reflexes are normal Cranial Nerves are normal.  ASSESSMENT: CKD (chronic kidney disease) - Plan: Vit D  25 hydroxy (rtn osteoporosis monitoring)  Diabetes mellitus - Plan: metFORMIN (GLUCOPHAGE) 500 MG tablet, POCT glycosylated hemoglobin (Hb A1C), CMP14+EGFR  Diabetic neuropathy - Plan: gabapentin (NEURONTIN) 800 MG  tablet  Essential hypertension, benign - Plan: CMP14+EGFR  Fibromyalgia - Plan: Vit D  25 hydroxy (rtn osteoporosis monitoring)  GERD (gastroesophageal reflux disease)  History of colon cancer  Osteoarthritis (arthritis due to wear and tear of joints)  Shortness of breath  Stress  UNSPECIFIED TACHYCARDIA  Pain in joint, lower leg, unspecified laterality - Plan: [place] Lower Ext Art Bilat  Anxiety state, unspecified - Plan: diazepam (VALIUM) 5 MG tablet  Hyperlipidemia - Plan: NMR, lipoprofile    PLAN: Orders Placed This Encounter  Procedures  . [place] Lower Ext Art Bilat    Standing Status: Future     Number of Occurrences:      Standing Expiration Date: 11/03/2014    Order Specific Question:  Reason for Exam (SYMPTOM  OR DIAGNOSIS REQUIRED)    Answer:  leg pain with ambulation suspect PAD    Order Specific Question:  Preferred imaging location?    Answer:  [place]      Meds ordered this encounter  Medications  . diazepam (VALIUM) 5 MG tablet    Sig: Take 0.5-1 tablets (2.5-5 mg total) by mouth 2 (two) times daily as needed. anxiety    Dispense:  30 tablet    Refill:  0  . gabapentin (NEURONTIN) 800 MG tablet    Sig: Take 1 tablet (800 mg total) by mouth 3 (three) times daily.    Dispense:  90 tablet    Refill:  6  . metFORMIN (GLUCOPHAGE) 500 MG tablet    Sig: Take 1 tablet (500 mg total) by mouth 2 (two) times daily with a meal.    Dispense:  60 tablet    Refill:  6   Await her labs. Further interventions pending her labs from 2 days ago.  Diet and keep  active.  Await the U/S arterial studies.  If negative for PAD consider MRI L/S.  Same  Regimen.  Return in about 3 months (around 12/03/2013) for Recheck medical problems.  Nizar Cutler P. [place] Charon, M.D.

## 2013-09-08 ENCOUNTER — Telehealth: Payer: Self-pay | Admitting: Family Medicine

## 2013-09-09 ENCOUNTER — Other Ambulatory Visit: Payer: Self-pay | Admitting: Family Medicine

## 2013-09-09 ENCOUNTER — Telehealth: Payer: Self-pay | Admitting: Family Medicine

## 2013-09-09 DIAGNOSIS — E785 Hyperlipidemia, unspecified: Secondary | ICD-10-CM

## 2013-09-09 MED ORDER — PRAVASTATIN SODIUM 80 MG PO TABS: 80.0000 mg | ORAL_TABLET | Freq: Every day | ORAL | Status: DC

## 2013-09-10 NOTE — Telephone Encounter (Signed)
Pt already notified earlier

## 2013-09-10 NOTE — Telephone Encounter (Signed)
Pt aware of labs  

## 2013-09-14 ENCOUNTER — Other Ambulatory Visit: Payer: Self-pay | Admitting: Family Medicine

## 2013-09-14 ENCOUNTER — Ambulatory Visit
Admission: RE | Admit: 2013-09-14 | Discharge: 2013-09-14 | Disposition: A | Discharge status: Home / Self Care | Payer: Self-pay | Source: Ambulatory Visit | Attending: Family Medicine | Admitting: Family Medicine

## 2013-09-14 ENCOUNTER — Other Ambulatory Visit: Payer: Self-pay | Admitting: *Deleted

## 2013-09-14 DIAGNOSIS — M25569 Pain in unspecified knee: Secondary | ICD-10-CM

## 2013-09-14 DIAGNOSIS — I739 Peripheral vascular disease, unspecified: Secondary | ICD-10-CM

## 2013-09-14 NOTE — Progress Notes (Signed)
Quick Note:  Call patient. Ultrasound was okay, no blockages in the arterial flow in the legs. No change in plan. Next step is an MRI of the lumbar spine. Is Dana Burns ready to move ahead? I have gone ahead and schedule it in EPIC. ______

## 2013-09-24 ENCOUNTER — Other Ambulatory Visit: Payer: Self-pay | Admitting: Family Medicine

## 2013-09-25 ENCOUNTER — Ambulatory Visit: Payer: Medicare Other

## 2013-09-28 ENCOUNTER — Ambulatory Visit: Payer: Medicare Other

## 2013-09-28 ENCOUNTER — Telehealth: Payer: Self-pay | Admitting: *Deleted

## 2013-09-28 NOTE — Telephone Encounter (Addendum)
Message copied by Baltazar Apo on Mon Sep 28, 2013  9:44 AM ------      Message from: Ileana Ladd      Created: Mon Sep 14, 2013 10:17 PM       Call patient.      Ultrasound was okay, no blockages in the arterial flow in the legs.      No change in plan.      Next step is an MRI of the lumbar spine.      Is she ready to move ahead? I have gone ahead and schedule it in EPIC. ------PT NOTIFIED AND SAID SHE COULDN'T GO FOR MRI NOW. SHE FELL AND BROKE HER ARM AND HAS A CAST.

## 2013-10-03 ENCOUNTER — Other Ambulatory Visit: Payer: Medicare Other

## 2013-11-02 ENCOUNTER — Other Ambulatory Visit: Payer: Self-pay | Admitting: Family Medicine

## 2013-11-23 ENCOUNTER — Encounter: Payer: Self-pay | Admitting: *Deleted

## 2013-11-27 ENCOUNTER — Other Ambulatory Visit: Payer: Self-pay | Admitting: Family Medicine

## 2013-12-10 ENCOUNTER — Ambulatory Visit: Payer: Medicare Other | Admitting: Family Medicine

## 2013-12-13 ENCOUNTER — Other Ambulatory Visit: Payer: Self-pay | Admitting: Family Medicine

## 2013-12-14 ENCOUNTER — Ambulatory Visit (INDEPENDENT_AMBULATORY_CARE_PROVIDER_SITE_OTHER): Payer: Medicare Other | Admitting: Family Medicine

## 2013-12-14 ENCOUNTER — Encounter: Payer: Self-pay | Admitting: Family Medicine

## 2013-12-14 VITALS — BP 157/77 | HR 108 | Temp 97.8°F | Ht 66.0 in | Wt 127.0 lb

## 2013-12-14 DIAGNOSIS — E1149 Type 2 diabetes mellitus with other diabetic neurological complication: Secondary | ICD-10-CM

## 2013-12-14 DIAGNOSIS — N6310 Unspecified lump in the right breast, unspecified quadrant: Secondary | ICD-10-CM

## 2013-12-14 DIAGNOSIS — IMO0001 Reserved for inherently not codable concepts without codable children: Secondary | ICD-10-CM

## 2013-12-14 DIAGNOSIS — S62109A Fracture of unspecified carpal bone, unspecified wrist, initial encounter for closed fracture: Secondary | ICD-10-CM | POA: Insufficient documentation

## 2013-12-14 DIAGNOSIS — M25569 Pain in unspecified knee: Secondary | ICD-10-CM

## 2013-12-14 DIAGNOSIS — R000 Tachycardia, unspecified: Secondary | ICD-10-CM

## 2013-12-14 DIAGNOSIS — Z733 Stress, not elsewhere classified: Secondary | ICD-10-CM

## 2013-12-14 DIAGNOSIS — E1140 Type 2 diabetes mellitus with diabetic neuropathy, unspecified: Secondary | ICD-10-CM

## 2013-12-14 DIAGNOSIS — I10 Essential (primary) hypertension: Secondary | ICD-10-CM

## 2013-12-14 DIAGNOSIS — R634 Abnormal weight loss: Secondary | ICD-10-CM | POA: Insufficient documentation

## 2013-12-14 DIAGNOSIS — E785 Hyperlipidemia, unspecified: Secondary | ICD-10-CM

## 2013-12-14 DIAGNOSIS — F411 Generalized anxiety disorder: Secondary | ICD-10-CM

## 2013-12-14 DIAGNOSIS — M8580 Other specified disorders of bone density and structure, unspecified site: Secondary | ICD-10-CM

## 2013-12-14 DIAGNOSIS — N6089 Other benign mammary dysplasias of unspecified breast: Secondary | ICD-10-CM

## 2013-12-14 DIAGNOSIS — N6099 Unspecified benign mammary dysplasia of unspecified breast: Secondary | ICD-10-CM

## 2013-12-14 DIAGNOSIS — M797 Fibromyalgia: Secondary | ICD-10-CM

## 2013-12-14 DIAGNOSIS — F439 Reaction to severe stress, unspecified: Secondary | ICD-10-CM

## 2013-12-14 DIAGNOSIS — M1990 Unspecified osteoarthritis, unspecified site: Secondary | ICD-10-CM

## 2013-12-14 DIAGNOSIS — N63 Unspecified lump in unspecified breast: Secondary | ICD-10-CM

## 2013-12-14 DIAGNOSIS — S62101S Fracture of unspecified carpal bone, right wrist, sequela: Secondary | ICD-10-CM

## 2013-12-14 DIAGNOSIS — E119 Type 2 diabetes mellitus without complications: Secondary | ICD-10-CM

## 2013-12-14 DIAGNOSIS — Z85038 Personal history of other malignant neoplasm of large intestine: Secondary | ICD-10-CM

## 2013-12-14 DIAGNOSIS — M899 Disorder of bone, unspecified: Secondary | ICD-10-CM

## 2013-12-14 DIAGNOSIS — E1142 Type 2 diabetes mellitus with diabetic polyneuropathy: Secondary | ICD-10-CM

## 2013-12-14 DIAGNOSIS — K219 Gastro-esophageal reflux disease without esophagitis: Secondary | ICD-10-CM

## 2013-12-14 DIAGNOSIS — N189 Chronic kidney disease, unspecified: Secondary | ICD-10-CM

## 2013-12-14 DIAGNOSIS — S42309S Unspecified fracture of shaft of humerus, unspecified arm, sequela: Secondary | ICD-10-CM

## 2013-12-14 HISTORY — DX: Other specified disorders of bone density and structure, unspecified site: M85.80

## 2013-12-14 HISTORY — DX: Abnormal weight loss: R63.4

## 2013-12-14 LAB — POCT CBC
Granulocyte percent: 75.9 %G (ref 37–80)
HCT, POC: 38.6 % (ref 37.7–47.9)
Hemoglobin: 12.2 g/dL (ref 12.2–16.2)
Lymph, poc: 1.1 (ref 0.6–3.4)
MCH, POC: 26.1 pg — AB (ref 27–31.2)
MCHC: 31.6 g/dL — AB (ref 31.8–35.4)
MCV: 82.5 fL (ref 80–97)
MPV: 8 fL (ref 0–99.8)
POC Granulocyte: 4.9 (ref 2–6.9)
POC LYMPH PERCENT: 17.1 %L (ref 10–50)
Platelet Count, POC: 278 10*3/uL (ref 142–424)
RBC: 4.7 M/uL (ref 4.04–5.48)
RDW, POC: 16.4 %
WBC: 6.5 10*3/uL (ref 4.6–10.2)

## 2013-12-14 LAB — POCT GLYCOSYLATED HEMOGLOBIN (HGB A1C): Hemoglobin A1C: 5.7

## 2013-12-14 MED ORDER — HYDROCODONE-ACETAMINOPHEN 5-325 MG PO TABS: 1.0000 | ORAL_TABLET | Freq: Four times a day (QID) | ORAL | Status: DC | PRN

## 2013-12-14 MED ORDER — DIAZEPAM 5 MG PO TABS: 2.5000 mg | ORAL_TABLET | Freq: Two times a day (BID) | ORAL | Status: DC | PRN

## 2013-12-14 MED ORDER — MILNACIPRAN HCL 50 MG PO TABS: 100.0000 mg | ORAL_TABLET | Freq: Two times a day (BID) | ORAL | Status: DC

## 2013-12-14 NOTE — Progress Notes (Signed)
Patient ID: Dana Burns, female   DOB: 05/11/1926, 79 y.o.   MRN: [id number] SUBJECTIVE: CC: Chief Complaint  Patient presents with  . Follow-up    3 month f/u on chronic medical conditions.   . Medication Refill    Requesting refill on hydrocodone-APAP 5/325 (prescribed by ortho for fx), savella 50mg  (patient states she takes 2 BID but directions in EMR says 1 BID), and Diazepam 5 mg    HPI: Here for folllow up. Since last visit fell in the bathroom and landed on her arm and crushed a bone in the wrist. She had sprayed air freshener and it had made the floor slippery.  Started PT with hand PT. Sees Dr Sherene Sires office for orthopedic care.  Lost some weight due to the stresses that she has been going through  Patient is here for follow up of Diabetes Mellitus/HTN/HLD: Symptoms evaluated: Denies Nocturia ,Denies Urinary Frequency , denies Blurred vision ,deniesDizziness,denies.Dysuria,denies paresthesias, denies extremity pain or ulcers.Marland Kitchendenies chest pain. has had an annual eye exam. do check the feet. Does check CBGs. Average CBG: Denies episodes of hypoglycemia. Does have an emergency hypoglycemic plan. admits toCompliance with medications. Denies Problems with medications.   Past Medical History  Diagnosis Date  . Diabetes mellitus     6 years  . Peripheral neuropathy   . Fibromyalgia   . Colon cancer   . Complication of anesthesia     agitated when waking up, wakes up shaking, "like  I'm going into shock"  . Hypertension     15 years, reports she has a rapid heartrate   . Depression     husband died  3 months ago, pt. admits depression  currently   . Shortness of breath   . GERD (gastroesophageal reflux disease)   . Headache(784.0)     occasional - "bad headaches"  . Osteoarthritis     in hands & all over, feet  . H/O echocardiogram     done /w [place] in 2011, saw Dr. Antoine Poche for tachycardia   . Hyperlipidemia     6 years   Past Surgical History  Procedure  Laterality Date  . Cataract extraction      /w IOL- bilateral   . Colon surgery      For resection of cancer  . Nodule removed      From throat   History   Social History  . Marital Status: Widowed    Spouse Name: N/A    Number of Children: 1  . Years of Education: N/A   Occupational History  . Retired    Social History Main Topics  . Smoking status: Never Smoker   . Smokeless tobacco: Not on file  . Alcohol Use: No  . Drug Use: No  . Sexual Activity: Not on file   Other Topics Concern  . Not on file   Social History Narrative  . No narrative on file   Family History  Problem Relation Age of Onset  . Cancer Mother     Brain  . Cancer Father     Colorectal  . Heart disease Neg Hx     Early  . Cancer Sister     breast   Current Outpatient Prescriptions on File Prior to Visit  Medication Sig Dispense Refill  . calcium carbonate (OS-CAL) 600 MG TABS Take 600 mg by mouth 2 (two) times daily with a meal.        . Cholecalciferol (VITAMIN D3) 5000 UNITS CAPS Take 5,000  Units by mouth daily.      Marland Kitchen gabapentin (NEURONTIN) 800 MG tablet TAKE 1 TABLET THREE TIMES A DAY  90 tablet  2  . metFORMIN (GLUCOPHAGE) 500 MG tablet TAKE 2 TABLETS TWICE A DAY  120 tablet  2  . Omega-3 Fatty Acids (FISH OIL) 1200 MG CAPS Take 1,200 mg by mouth 2 (two) times daily.        Marland Kitchen omeprazole (PRILOSEC) 20 MG capsule Take 1 capsule (20 mg total) by mouth 2 (two) times daily.  30 capsule  5  . pravastatin (PRAVACHOL) 80 MG tablet Take 1 tablet (80 mg total) by mouth at bedtime.  30 tablet  4  . valsartan-hydrochlorothiazide (DIOVAN-HCT) 160-12.5 MG per tablet TAKE 1 TABLET EVERY DAY  30 tablet  2   No current facility-administered medications on file prior to visit.   Allergies  Allergen Reactions  . Tape Itching and Rash  . Actos [Pioglitazone Hydrochloride]   . Buspar [Buspirone Hcl]   . Lipitor [Atorvastatin Calcium]   . Penicillins Rash   Immunization History  Administered Date(s)  Administered  . Influenza Whole 09/30/2010  . Td 01/01/2004  . Tdap 10/17/2011   Prior to Admission medications   Medication Sig Start Date End Date Taking? Authorizing Provider  calcium carbonate (OS-CAL) 600 MG TABS Take 600 mg by mouth 2 (two) times daily with a meal.     Yes Historical Provider, MD  Cholecalciferol (VITAMIN D3) 5000 UNITS CAPS Take 5,000 Units by mouth daily.   Yes Historical Provider, MD  diazepam (VALIUM) 5 MG tablet Take 0.5-1 tablets (2.5-5 mg total) by mouth 2 (two) times daily as needed. anxiety 09/03/13  Yes Ileana Ladd, MD  gabapentin (NEURONTIN) 800 MG tablet TAKE 1 TABLET THREE TIMES A DAY 09/24/13  Yes Ernestina Penna, MD  metFORMIN (GLUCOPHAGE) 500 MG tablet TAKE 2 TABLETS TWICE A DAY 11/02/13  Yes Ileana Ladd, MD  Milnacipran (SAVELLA) 50 MG TABS Take 1 tablet (50 mg total) by mouth 2 (two) times daily. 06/01/13  Yes Ileana Ladd, MD  Omega-3 Fatty Acids (FISH OIL) 1200 MG CAPS Take 1,200 mg by mouth 2 (two) times daily.     Yes Historical Provider, MD  HYDROcodone-acetaminophen (NORCO/VICODIN) 5-325 MG per tablet  11/13/13   Historical Provider, MD  omeprazole (PRILOSEC) 20 MG capsule Take 1 capsule (20 mg total) by mouth 2 (two) times daily. 06/01/13   Ileana Ladd, MD  pravastatin (PRAVACHOL) 80 MG tablet Take 1 tablet (80 mg total) by mouth at bedtime. 09/09/13   Ileana Ladd, MD  valsartan-hydrochlorothiazide (DIOVAN-HCT) 160-12.5 MG per tablet TAKE 1 TABLET EVERY DAY 11/27/13   Mary-Margaret Daphine Deutscher, FNP     ROS: As above in the HPI. All other systems are stable or negative.  OBJECTIVE: APPEARANCE:  Patient in no acute distress.The patient appeared well nourished and normally developed. Acyanotic. Waist: VITAL SIGNS:BP 157/77  Pulse 108  Temp(Src) 97.8 F (36.6 C) (Oral)  Ht 5\' 6"  (1.676 m)  Wt 127 lb (57.607 kg)  BMI 20.51 kg/m2  WF Weight loss noted.   SKIN: warm and  Dry without overt rashes, tattoos and scars  HEAD and Neck:  without JVD, Head and scalp: normal Eyes:No scleral icterus. Fundi normal, eye movements normal. Ears: Auricle normal, canal normal, Tympanic membranes normal, insufflation normal. Nose: normal Throat: normal Neck & thyroid: normal  CHEST & LUNGS: Chest wall: normal Lungs: Clear  CVS: Reveals the PMI to be normally located. Regular  rhythm, First and Second Heart sounds are normal,  absence of murmurs, rubs or gallops. Peripheral vasculature: Radial pulses: normal Dorsal pedis pulses: normal Posterior pulses: normal  ABDOMEN:  Appearance: normal Benign, no organomegaly, no masses, no Abdominal Aortic enlargement. No Guarding , no rebound. No Bruits. Bowel sounds: normal  RECTAL: N/A GU: N/A  EXTREMETIES: nonedematous. Right Upper extremity in a removable short cast/splint.   NEUROLOGIC: oriented to time,place and person; nonfocal.  ASSESSMENT:   Diabetes mellitus - Plan: POCT glycosylated hemoglobin (Hb A1C)  UNSPECIFIED TACHYCARDIA  Diabetic neuropathy  Stress  Pain in joint, lower leg, unspecified laterality  Osteoarthritis (arthritis due to wear and tear of joints) - Plan: HYDROcodone-acetaminophen (NORCO/VICODIN) 5-325 MG per tablet  Breast mass, right  CKD (chronic kidney disease)  Ductal hyperplasia of breast  Essential hypertension, benign  History of colon cancer  Hyperlipidemia - Plan: NMR, lipoprofile  Anxiety state, unspecified - Plan: diazepam (VALIUM) 5 MG tablet  Fibromyalgia - Plan: Milnacipran (SAVELLA) 50 MG TABS tablet  GERD (gastroesophageal reflux disease)  Loss of weight - Plan: CMP14+EGFR, Amylase, Lipase, POCT CBC  Fracture of wrist, right, sequela  Osteopenia - Plan: Vit D  25 hydroxy (rtn osteoporosis monitoring)  Patient attributes weight loss to stress.  PLAN: Plant based diet.  Keep active. Fall prevention. Expressed concern over the weight loss. Will like to check in 2 weeks and if progressive will need  further evaluation.   Orders Placed This Encounter  Procedures  . CMP14+EGFR  . NMR, lipoprofile  . Vit D  25 hydroxy (rtn osteoporosis monitoring)  . Amylase  . Lipase  . POCT CBC  . POCT glycosylated hemoglobin (Hb A1C)   Meds ordered this encounter  Medications  . DISCONTD: HYDROcodone-acetaminophen (NORCO/VICODIN) 5-325 MG per tablet    Sig:   . diazepam (VALIUM) 5 MG tablet    Sig: Take 0.5-1 tablets (2.5-5 mg total) by mouth 2 (two) times daily as needed. anxiety    Dispense:  30 tablet    Refill:  0  . HYDROcodone-acetaminophen (NORCO/VICODIN) 5-325 MG per tablet    Sig: Take 1 tablet by mouth every 6 (six) hours as needed for moderate pain.    Dispense:  30 tablet    Refill:  0  . Milnacipran (SAVELLA) 50 MG TABS tablet    Sig: Take 2 tablets (100 mg total) by mouth 2 (two) times daily.    Dispense:  120 tablet    Refill:  5   Medications Discontinued During This Encounter  Medication Reason  . diazepam (VALIUM) 5 MG tablet Reorder  . HYDROcodone-acetaminophen (NORCO/VICODIN) 5-325 MG per tablet Reorder  . Milnacipran (SAVELLA) 50 MG TABS Reorder   Return in about 2 weeks (around 12/28/2013) for Recheck medical problems.  Camaron Cammack P. [place] Charon, M.D.

## 2013-12-15 ENCOUNTER — Telehealth: Payer: Self-pay | Admitting: Family Medicine

## 2013-12-16 LAB — CMP14+EGFR
ALT: 7 IU/L (ref 0–32)
AST: 16 IU/L (ref 0–40)
Albumin/Globulin Ratio: 1.9 (ref 1.1–2.5)
Albumin: 4.5 g/dL (ref 3.5–4.7)
Alkaline Phosphatase: 65 IU/L (ref 39–117)
BUN/Creatinine Ratio: 18 (ref 11–26)
BUN: 15 mg/dL (ref 8–27)
CO2: 27 mmol/L (ref 18–29)
Calcium: 10.4 mg/dL — ABNORMAL HIGH (ref 8.6–10.2)
Chloride: 98 mmol/L (ref 97–108)
Creatinine, Ser: 0.85 mg/dL (ref 0.57–1.00)
GFR calc Af Amer: 72 mL/min/{1.73_m2} (ref >59)
GFR calc non Af Amer: 63 mL/min/{1.73_m2} (ref >59)
Globulin, Total: 2.4 g/dL (ref 1.5–4.5)
Glucose: 108 mg/dL — ABNORMAL HIGH (ref 65–99)
Potassium: 4.6 mmol/L (ref 3.5–5.2)
Sodium: 142 mmol/L (ref 134–144)
Total Bilirubin: <0.2 mg/dL (ref 0.0–1.2)
Total Protein: 6.9 g/dL (ref 6.0–8.5)

## 2013-12-16 LAB — NMR, LIPOPROFILE
Cholesterol: 177 mg/dL (ref <200)
HDL Cholesterol by NMR: 54 mg/dL (ref >=40)
HDL Particle Number: 36.7 umol/L (ref >=30.5)
LDL Particle Number: 1161 nmol/L — ABNORMAL HIGH (ref <1000)
LDL Size: 20.7 nm (ref >20.5)
LDLC SERPL CALC-MCNC: 92 mg/dL (ref <100)
LP-IR Score: 37 (ref <=45)
Small LDL Particle Number: 458 nmol/L (ref <=527)
Triglycerides by NMR: 153 mg/dL — ABNORMAL HIGH (ref <150)

## 2013-12-16 LAB — AMYLASE: Amylase: 56 U/L (ref 31–124)

## 2013-12-16 LAB — LIPASE: Lipase: 18 U/L (ref 0–59)

## 2013-12-16 LAB — VITAMIN D 25 HYDROXY (VIT D DEFICIENCY, FRACTURES): Vit D, 25-Hydroxy: 65.1 ng/mL (ref 30.0–100.0)

## 2013-12-20 NOTE — Progress Notes (Signed)
Quick Note:  Call Patient Labs that are abnormal: HGBA1C was too well controlled  The ldlc is not quite at goal The Vit D is too high  The rest are at goal  Recommendations: May cut back one of the metformin tablets daily.so take 2 in am and 1 in pm. Cut back the Vit D to 4000 units daily Diet: avoid sweets , rapidly digested starches: bread, mashed potatoes., etc    ______

## 2013-12-23 NOTE — Telephone Encounter (Signed)
Correction made to Rx of savella. Message left on voicemail at [facility]. #60 with refills instead of 120. Angelene Rome P. [place] Charon, M.D.

## 2014-01-04 ENCOUNTER — Ambulatory Visit (INDEPENDENT_AMBULATORY_CARE_PROVIDER_SITE_OTHER): Payer: Medicare Other | Admitting: Family Medicine

## 2014-01-04 ENCOUNTER — Encounter: Payer: Self-pay | Admitting: Family Medicine

## 2014-01-04 VITALS — BP 151/79 | HR 88 | Temp 97.1°F | Ht 66.0 in | Wt 134.0 lb

## 2014-01-04 DIAGNOSIS — E119 Type 2 diabetes mellitus without complications: Secondary | ICD-10-CM

## 2014-01-04 DIAGNOSIS — F411 Generalized anxiety disorder: Secondary | ICD-10-CM

## 2014-01-04 DIAGNOSIS — IMO0001 Reserved for inherently not codable concepts without codable children: Secondary | ICD-10-CM

## 2014-01-04 DIAGNOSIS — I10 Essential (primary) hypertension: Secondary | ICD-10-CM

## 2014-01-04 DIAGNOSIS — N6310 Unspecified lump in the right breast, unspecified quadrant: Secondary | ICD-10-CM

## 2014-01-04 DIAGNOSIS — N6089 Other benign mammary dysplasias of unspecified breast: Secondary | ICD-10-CM

## 2014-01-04 DIAGNOSIS — K219 Gastro-esophageal reflux disease without esophagitis: Secondary | ICD-10-CM

## 2014-01-04 DIAGNOSIS — N6099 Unspecified benign mammary dysplasia of unspecified breast: Secondary | ICD-10-CM

## 2014-01-04 DIAGNOSIS — Z85038 Personal history of other malignant neoplasm of large intestine: Secondary | ICD-10-CM

## 2014-01-04 DIAGNOSIS — N63 Unspecified lump in unspecified breast: Secondary | ICD-10-CM

## 2014-01-04 DIAGNOSIS — F439 Reaction to severe stress, unspecified: Secondary | ICD-10-CM

## 2014-01-04 DIAGNOSIS — E1149 Type 2 diabetes mellitus with other diabetic neurological complication: Secondary | ICD-10-CM

## 2014-01-04 DIAGNOSIS — N189 Chronic kidney disease, unspecified: Secondary | ICD-10-CM

## 2014-01-04 DIAGNOSIS — M797 Fibromyalgia: Secondary | ICD-10-CM

## 2014-01-04 DIAGNOSIS — R634 Abnormal weight loss: Secondary | ICD-10-CM

## 2014-01-04 DIAGNOSIS — E785 Hyperlipidemia, unspecified: Secondary | ICD-10-CM

## 2014-01-04 DIAGNOSIS — E1140 Type 2 diabetes mellitus with diabetic neuropathy, unspecified: Secondary | ICD-10-CM

## 2014-01-04 DIAGNOSIS — Z733 Stress, not elsewhere classified: Secondary | ICD-10-CM

## 2014-01-04 DIAGNOSIS — E1142 Type 2 diabetes mellitus with diabetic polyneuropathy: Secondary | ICD-10-CM

## 2014-01-04 NOTE — Progress Notes (Signed)
Patient ID: Dana Burns, female   DOB: [birth year], 80 y.o.   MRN: [id number] SUBJECTIVE: CC: Chief Complaint  Patient presents with  . Follow-up    3 week reck been off savella for 3 weeks . c/o feet swelling. rt wrist in cast type splint states fx wrist 4 mon th ago will be seeing Dr Nolon Bussing nier tommorrow     HPI: Here for recheck of her weight loss. Her appetite has returned to normal and she has regained her weight. Thinks it is all due to stress. For years has had swelling of the legs especially after she gets up in the morning. Goes down a bit overnight. No chest pain, no SOB.  Past Medical History  Diagnosis Date  . Diabetes mellitus     6 years  . Peripheral neuropathy   . Fibromyalgia   . Colon cancer   . Complication of anesthesia     agitated when waking up, wakes up shaking, "like  I'm going into shock"  . Hypertension     15 years, reports she has a rapid heartrate   . Depression     husband died  3 months ago, pt. admits depression  currently   . Shortness of breath   . GERD (gastroesophageal reflux disease)   . Headache(784.0)     occasional - "bad headaches"  . Osteoarthritis     in hands & all over, feet  . H/O echocardiogram     done /w [place] in 2011, saw Dr. Percival Spanish for tachycardia   . Hyperlipidemia     6 years   Past Surgical History  Procedure Laterality Date  . Cataract extraction      /w IOL- bilateral   . Colon surgery      For resection of cancer  . Nodule removed      From throat   History   Social History  . Marital Status: Widowed    Spouse Name: N/A    Number of Children: 1  . Years of Education: N/A   Occupational History  . Retired    Social History Main Topics  . Smoking status: Never Smoker   . Smokeless tobacco: Not on file  . Alcohol Use: No  . Drug Use: No  . Sexual Activity: Not on file   Other Topics Concern  . Not on file   Social History Narrative  . No narrative on file   Family History  Problem Relation  Age of Onset  . Cancer Mother     Brain  . Cancer Father     Colorectal  . Heart disease Neg Hx     Early  . Cancer Sister     breast   Current Outpatient Prescriptions on File Prior to Visit  Medication Sig Dispense Refill  . calcium carbonate (OS-CAL) 600 MG TABS Take 600 mg by mouth 2 (two) times daily with a meal.        . Cholecalciferol (VITAMIN D3) 5000 UNITS CAPS Take 5,000 Units by mouth daily.      . diazepam (VALIUM) 5 MG tablet Take 0.5-1 tablets (2.5-5 mg total) by mouth 2 (two) times daily as needed. anxiety  30 tablet  0  . gabapentin (NEURONTIN) 800 MG tablet TAKE 1 TABLET THREE TIMES A DAY  90 tablet  2  . HYDROcodone-acetaminophen (NORCO/VICODIN) 5-325 MG per tablet Take 1 tablet by mouth every 6 (six) hours as needed for moderate pain.  30 tablet  0  . metFORMIN (  GLUCOPHAGE) 500 MG tablet TAKE 2 TABLETS TWICE A DAY  120 tablet  2  . Omega-3 Fatty Acids (FISH OIL) 1200 MG CAPS Take 1,200 mg by mouth 2 (two) times daily.        Marland Kitchen omeprazole (PRILOSEC) 20 MG capsule Take 1 capsule (20 mg total) by mouth 2 (two) times daily.  30 capsule  5  . pravastatin (PRAVACHOL) 80 MG tablet Take 1 tablet (80 mg total) by mouth at bedtime.  30 tablet  4  . valsartan-hydrochlorothiazide (DIOVAN-HCT) 160-12.5 MG per tablet TAKE 1 TABLET EVERY DAY  30 tablet  2  . Milnacipran (SAVELLA) 50 MG TABS tablet Take 2 tablets (100 mg total) by mouth 2 (two) times daily.  120 tablet  5   No current facility-administered medications on file prior to visit.   Allergies  Allergen Reactions  . Tape Itching and Rash  . Actos [Pioglitazone Hydrochloride]   . Buspar [Buspirone Hcl]   . Lipitor [Atorvastatin Calcium]   . Penicillins Rash   Immunization History  Administered Date(s) Administered  . Influenza Whole 09/30/2010  . Influenza-Unspecified 01/04/2014  . Td 01/01/2004  . Tdap 10/17/2011   Prior to Admission medications   Medication Sig Start Date End Date Taking? Authorizing Provider   calcium carbonate (OS-CAL) 600 MG TABS Take 600 mg by mouth 2 (two) times daily with a meal.     Yes Historical Provider, MD  Cholecalciferol (VITAMIN D3) 5000 UNITS CAPS Take 5,000 Units by mouth daily.   Yes Historical Provider, MD  diazepam (VALIUM) 5 MG tablet Take 0.5-1 tablets (2.5-5 mg total) by mouth 2 (two) times daily as needed. anxiety 12/14/13  Yes Vernie Shanks, MD  gabapentin (NEURONTIN) 800 MG tablet TAKE 1 TABLET THREE TIMES A DAY 09/24/13  Yes Chipper Herb, MD  HYDROcodone-acetaminophen (NORCO/VICODIN) 5-325 MG per tablet Take 1 tablet by mouth every 6 (six) hours as needed for moderate pain. 12/14/13  Yes Vernie Shanks, MD  metFORMIN (GLUCOPHAGE) 500 MG tablet TAKE 2 TABLETS TWICE A DAY 11/02/13  Yes Vernie Shanks, MD  Omega-3 Fatty Acids (FISH OIL) 1200 MG CAPS Take 1,200 mg by mouth 2 (two) times daily.     Yes Historical Provider, MD  omeprazole (PRILOSEC) 20 MG capsule Take 1 capsule (20 mg total) by mouth 2 (two) times daily. 06/01/13  Yes Vernie Shanks, MD  pravastatin (PRAVACHOL) 80 MG tablet Take 1 tablet (80 mg total) by mouth at bedtime. 09/09/13  Yes Vernie Shanks, MD  valsartan-hydrochlorothiazide (DIOVAN-HCT) 160-12.5 MG per tablet TAKE 1 TABLET EVERY DAY 11/27/13  Yes Mary-Margaret Hassell Done, FNP  Milnacipran (SAVELLA) 50 MG TABS tablet Take 2 tablets (100 mg total) by mouth 2 (two) times daily. 12/14/13   Vernie Shanks, MD     ROS: As above in the HPI. All other systems are stable or negative.  OBJECTIVE: APPEARANCE:  Patient in no acute distress.The patient appeared well nourished and normally developed. Acyanotic. Waist: VITAL SIGNS:BP 151/79  Pulse 88  Temp(Src) 97.1 F (36.2 C) (Oral)  Ht 5\' 6"  (1.676 m)  Wt 134 lb (60.782 kg)  BMI 21.64 kg/m2  WF  SKIN: warm and  Dry without overt rashes, tattoos and scars  HEAD and Neck: without JVD, Head and scalp: normal Eyes:No scleral icterus. Fundi normal, eye movements normal. Ears: Auricle normal,  canal normal, Tympanic membranes normal, insufflation normal. Nose: normal Throat: normal Neck & thyroid: normal  CHEST & LUNGS: Chest wall: normal Lungs: Clear  CVS: Reveals the PMI to be normally located. Regular rhythm, First and Second Heart sounds are normal,  absence of murmurs, rubs or gallops. Peripheral vasculature: Radial pulses: normal Dorsal pedis pulses: normal Posterior pulses: normal  ABDOMEN:  Appearance: normal Benign, no organomegaly, no masses, no Abdominal Aortic enlargement. No Guarding , no rebound. No Bruits. Bowel sounds: normal  RECTAL: N/A GU: N/A  EXTREMETIES: 1+ pedal edema bilaterally.  MUSCULOSKELETAL:  Spine: normal Joints: right wrist in a removable splint.   NEUROLOGIC: oriented to time,place and person; nonfocal.   ASSESSMENT: Loss of weight - resolved  Breast mass, right  CKD (chronic kidney disease)  Diabetes mellitus  Diabetic neuropathy  Ductal hyperplasia of breast  Essential hypertension, benign  History of colon cancer  Anxiety state, unspecified  Fibromyalgia  GERD (gastroesophageal reflux disease)  Stress  Hyperlipidemia Pedal edema secondary to dependent edema.  PLAN: Diet exercise stress reduction. Same meds. Monitor BP.  No orders of the defined types were placed in this encounter.   No orders of the defined types were placed in this encounter.   There are no discontinued medications. Return in about 3 months (around 04/04/2014) for Recheck medical problems.  Mikiyah Glasner P. Jacelyn Grip, M.D.

## 2014-01-08 ENCOUNTER — Other Ambulatory Visit: Payer: Self-pay | Admitting: Family Medicine

## 2014-01-12 ENCOUNTER — Other Ambulatory Visit: Payer: Self-pay | Admitting: Family Medicine

## 2014-02-08 ENCOUNTER — Other Ambulatory Visit: Payer: Self-pay | Admitting: Family Medicine

## 2014-02-24 ENCOUNTER — Other Ambulatory Visit: Payer: Self-pay | Admitting: Nurse Practitioner

## 2014-03-09 ENCOUNTER — Other Ambulatory Visit: Payer: Self-pay | Admitting: Orthopedic Surgery

## 2014-03-09 DIAGNOSIS — M25511 Pain in right shoulder: Secondary | ICD-10-CM

## 2014-03-09 DIAGNOSIS — M778 Other enthesopathies, not elsewhere classified: Secondary | ICD-10-CM

## 2014-03-09 DIAGNOSIS — M7580 Other shoulder lesions, unspecified shoulder: Secondary | ICD-10-CM

## 2014-03-14 ENCOUNTER — Ambulatory Visit
Admission: RE | Admit: 2014-03-14 | Discharge: 2014-03-14 | Disposition: A | Discharge status: Home / Self Care | Payer: Medicare Other | Source: Ambulatory Visit | Attending: Orthopedic Surgery | Admitting: Orthopedic Surgery

## 2014-03-14 DIAGNOSIS — M25511 Pain in right shoulder: Secondary | ICD-10-CM

## 2014-03-14 DIAGNOSIS — M7580 Other shoulder lesions, unspecified shoulder: Secondary | ICD-10-CM

## 2014-03-14 DIAGNOSIS — M778 Other enthesopathies, not elsewhere classified: Secondary | ICD-10-CM

## 2014-03-16 ENCOUNTER — Ambulatory Visit (INDEPENDENT_AMBULATORY_CARE_PROVIDER_SITE_OTHER): Payer: Medicare Other | Admitting: Family Medicine

## 2014-03-16 ENCOUNTER — Encounter: Payer: Self-pay | Admitting: Family Medicine

## 2014-03-16 VITALS — BP 160/87 | HR 89 | Temp 98.0°F | Ht 65.0 in | Wt 134.4 lb

## 2014-03-16 DIAGNOSIS — E1149 Type 2 diabetes mellitus with other diabetic neurological complication: Secondary | ICD-10-CM

## 2014-03-16 DIAGNOSIS — M1990 Unspecified osteoarthritis, unspecified site: Secondary | ICD-10-CM

## 2014-03-16 DIAGNOSIS — E1142 Type 2 diabetes mellitus with diabetic polyneuropathy: Secondary | ICD-10-CM

## 2014-03-16 DIAGNOSIS — I10 Essential (primary) hypertension: Secondary | ICD-10-CM

## 2014-03-16 DIAGNOSIS — N6099 Unspecified benign mammary dysplasia of unspecified breast: Secondary | ICD-10-CM

## 2014-03-16 DIAGNOSIS — N6310 Unspecified lump in the right breast, unspecified quadrant: Secondary | ICD-10-CM

## 2014-03-16 DIAGNOSIS — K219 Gastro-esophageal reflux disease without esophagitis: Secondary | ICD-10-CM

## 2014-03-16 DIAGNOSIS — M797 Fibromyalgia: Secondary | ICD-10-CM

## 2014-03-16 DIAGNOSIS — N63 Unspecified lump in unspecified breast: Secondary | ICD-10-CM

## 2014-03-16 DIAGNOSIS — N6089 Other benign mammary dysplasias of unspecified breast: Secondary | ICD-10-CM

## 2014-03-16 DIAGNOSIS — F411 Generalized anxiety disorder: Secondary | ICD-10-CM

## 2014-03-16 DIAGNOSIS — N189 Chronic kidney disease, unspecified: Secondary | ICD-10-CM

## 2014-03-16 DIAGNOSIS — E119 Type 2 diabetes mellitus without complications: Secondary | ICD-10-CM

## 2014-03-16 DIAGNOSIS — E1140 Type 2 diabetes mellitus with diabetic neuropathy, unspecified: Secondary | ICD-10-CM

## 2014-03-16 DIAGNOSIS — Z85038 Personal history of other malignant neoplasm of large intestine: Secondary | ICD-10-CM

## 2014-03-16 DIAGNOSIS — IMO0001 Reserved for inherently not codable concepts without codable children: Secondary | ICD-10-CM

## 2014-03-16 DIAGNOSIS — E785 Hyperlipidemia, unspecified: Secondary | ICD-10-CM

## 2014-03-16 LAB — POCT GLYCOSYLATED HEMOGLOBIN (HGB A1C): Hemoglobin A1C: 5.5

## 2014-03-16 MED ORDER — VALSARTAN-HYDROCHLOROTHIAZIDE 160-12.5 MG PO TABS: 2.0000 | ORAL_TABLET | Freq: Every day | ORAL | Status: DC

## 2014-03-16 MED ORDER — DIAZEPAM 5 MG PO TABS: 2.5000 mg | ORAL_TABLET | Freq: Two times a day (BID) | ORAL | Status: DC | PRN

## 2014-03-16 NOTE — Patient Instructions (Signed)
DASH Diet The DASH diet stands for "Dietary Approaches to Stop Hypertension." It is a healthy eating plan that has been shown to reduce high blood pressure (hypertension) in as little as 14 days, while also possibly providing other significant health benefits. These other health benefits include reducing the risk of breast cancer after menopause and reducing the risk of type 2 diabetes, heart disease, colon cancer, and stroke. Health benefits also include weight loss and slowing kidney failure in patients with chronic kidney disease.  DIET GUIDELINES  Limit salt (sodium). Your diet should contain less than 1500 mg of sodium daily.  Limit refined or processed carbohydrates. Your diet should include mostly whole grains. Desserts and added sugars should be used sparingly.  Include small amounts of heart-healthy fats. These types of fats include nuts, oils, and tub margarine. Limit saturated and trans fats. These fats have been shown to be harmful in the body. CHOOSING FOODS  The following food groups are based on a 2000 calorie diet. See your Registered Dietitian for individual calorie needs. Grains and Grain Products (6 to 8 servings daily)  Eat More Often: Whole-wheat bread, brown rice, whole-grain or wheat pasta, quinoa, popcorn without added fat or salt (air popped).  Eat Less Often: White bread, white pasta, white rice, cornbread. Vegetables (4 to 5 servings daily)  Eat More Often: Fresh, frozen, and canned vegetables. Vegetables may be raw, steamed, roasted, or grilled with a minimal amount of fat.  Eat Less Often/Avoid: Creamed or fried vegetables. Vegetables in a cheese sauce. Fruit (4 to 5 servings daily)  Eat More Often: All fresh, canned (in natural juice), or frozen fruits. Dried fruits without added sugar. One hundred percent fruit juice ( cup [237 mL] daily).  Eat Less Often: Dried fruits with added sugar. Canned fruit in light or heavy syrup. [employer], Fish, and Poultry (2  servings or less daily. One serving is 3 to 4 oz [85-114 g]).  Eat More Often: Ninety percent or leaner ground beef, tenderloin, sirloin. Round cuts of beef, chicken breast, [place] breast. All fish. Grill, bake, or broil your meat. Nothing should be fried.  Eat Less Often/Avoid: Fatty cuts of meat, [place], or chicken leg, thigh, or wing. Fried cuts of meat or fish. Dairy (2 to 3 servings)  Eat More Often: Low-fat or fat-free milk, low-fat plain or light yogurt, reduced-fat or part-skim cheese.  Eat Less Often/Avoid: Milk (whole, 2%).Whole milk yogurt. Full-fat cheeses. Nuts, Seeds, and Legumes (4 to 5 servings per week)  Eat More Often: All without added salt.  Eat Less Often/Avoid: Salted nuts and seeds, canned beans with added salt. Fats and Sweets (limited)  Eat More Often: Vegetable oils, tub margarines without trans fats, sugar-free gelatin. Mayonnaise and salad dressings.  Eat Less Often/Avoid: Coconut oils, palm oils, butter, stick margarine, cream, half and half, cookies, candy, pie. FOR MORE INFORMATION The Dash Diet Eating Plan: www.dashdiet.org Document Released: 12/06/2011 Document Revised: 03/10/2012 Document Reviewed: 12/06/2011 [facility] Patient Information 2014 [place], [place].   Diabetes and Foot Care Diabetes may cause you to have problems because of poor blood supply (circulation) to your feet and legs. This may cause the skin on your feet to become thinner, break easier, and heal more slowly. Your skin may become dry, and the skin may peel and crack. You may also have nerve damage in your legs and feet causing decreased feeling in them. You may not notice minor injuries to your feet that could lead to infections or more serious problems. Taking  care of your feet is one of the most important things you can do for yourself.  HOME CARE INSTRUCTIONS  Wear shoes at all times, even in the house. Do not go barefoot. Bare feet are easily injured.  Check your feet daily for  blisters, cuts, and redness. If you cannot see the bottom of your feet, use a mirror or ask someone for help.  Wash your feet with warm water (do not use hot water) and mild soap. Then pat your feet and the areas between your toes until they are completely dry. Do not soak your feet as this can dry your skin.  Apply a moisturizing lotion or petroleum jelly (that does not contain alcohol and is unscented) to the skin on your feet and to dry, brittle toenails. Do not apply lotion between your toes.  Trim your toenails straight across. Do not dig under them or around the cuticle. File the edges of your nails with an emery board or nail file.  Do not cut corns or calluses or try to remove them with medicine.  Wear clean socks or stockings every day. Make sure they are not too tight. Do not wear knee-high stockings since they may decrease blood flow to your legs.  Wear shoes that fit properly and have enough cushioning. To break in new shoes, wear them for just a few hours a day. This prevents you from injuring your feet. Always look in your shoes before you put them on to be sure there are no objects inside.  Do not cross your legs. This may decrease the blood flow to your feet.  If you find a minor scrape, cut, or break in the skin on your feet, keep it and the skin around it clean and dry. These areas may be cleansed with mild soap and water. Do not cleanse the area with peroxide, alcohol, or iodine.  When you remove an adhesive bandage, be sure not to damage the skin around it.  If you have a wound, look at it several times a day to make sure it is healing.  Do not use heating pads or hot water bottles. They may burn your skin. If you have lost feeling in your feet or legs, you may not know it is happening until it is too late.  Make sure your health care provider performs a complete foot exam at least annually or more often if you have foot problems. Report any cuts, sores, or bruises to your  health care provider immediately. SEEK MEDICAL CARE IF:   You have an injury that is not healing.  You have cuts or breaks in the skin.  You have an ingrown nail.  You notice redness on your legs or feet.  You feel burning or tingling in your legs or feet.  You have pain or cramps in your legs and feet.  Your legs or feet are numb.  Your feet always feel cold. SEEK IMMEDIATE MEDICAL CARE IF:   There is increasing redness, swelling, or pain in or around a wound.  There is a red line that goes up your leg.  Pus is coming from a wound.  You develop a fever or as directed by your health care provider.  You notice a bad smell coming from an ulcer or wound. Document Released: 12/14/2000 Document Revised: 08/19/2013 Document Reviewed: 05/26/2013 [employer] Patient Information 2014 [employer].  

## 2014-03-16 NOTE — Progress Notes (Signed)
Patient ID: Dana Burns, female   DOB: 1926/02/24, 80 y.o.   MRN: [id number] SUBJECTIVE: CC: Chief Complaint  Patient presents with  . 3 month followup  . Hypertension  . Diabetes    HPI: Patient is here for follow up of Diabetes Mellitus/HTN/HLD/: Symptoms evaluated: Denies Nocturia ,Denies Urinary Frequency , denies Blurred vision ,deniesDizziness,denies.Dysuria,denies paresthesias, denies extremity pain or ulcers.Marland Kitchendenies chest pain. has had an annual eye exam. do check the feet. Does check CBGs. Average CBG:112 this am Denies episodes of hypoglycemia. Does have an emergency hypoglycemic plan. admits toCompliance with medications. Denies Problems with medications.  Past Medical History  Diagnosis Date  . Diabetes mellitus     6 years  . Peripheral neuropathy   . Fibromyalgia   . Colon cancer   . Complication of anesthesia     agitated when waking up, wakes up shaking, "like  I'm going into shock"  . Hypertension     15 years, reports she has a rapid heartrate   . Depression     husband died  3 months ago, pt. admits depression  currently   . Shortness of breath   . GERD (gastroesophageal reflux disease)   . Headache(784.0)     occasional - "bad headaches"  . Osteoarthritis     in hands & all over, feet  . H/O echocardiogram     done /w [place] in 2011, saw Dr. Percival Spanish for tachycardia   . Hyperlipidemia     6 years   Past Surgical History  Procedure Laterality Date  . Cataract extraction      /w IOL- bilateral   . Colon surgery      For resection of cancer  . Nodule removed      From throat   History   Social History  . Marital Status: Widowed    Spouse Name: N/A    Number of Children: 1  . Years of Education: N/A   Occupational History  . Retired    Social History Main Topics  . Smoking status: Never Smoker   . Smokeless tobacco: Not on file  . Alcohol Use: No  . Drug Use: No  . Sexual Activity: Not on file   Other Topics Concern  . Not on  file   Social History Narrative  . No narrative on file   Family History  Problem Relation Age of Onset  . Cancer Mother     Brain  . Cancer Father     Colorectal  . Heart disease Neg Hx     Early  . Cancer Sister     breast   Current Outpatient Prescriptions on File Prior to Visit  Medication Sig Dispense Refill  . calcium carbonate (OS-CAL) 600 MG TABS Take 600 mg by mouth 2 (two) times daily with a meal.        . Cholecalciferol (VITAMIN D3) 5000 UNITS CAPS Take 5,000 Units by mouth daily.      Marland Kitchen gabapentin (NEURONTIN) 800 MG tablet TAKE 1 TABLET THREE TIMES A DAY  90 tablet  2  . HYDROcodone-acetaminophen (NORCO/VICODIN) 5-325 MG per tablet Take 1 tablet by mouth every 6 (six) hours as needed for moderate pain.  30 tablet  0  . Omega-3 Fatty Acids (FISH OIL) 1200 MG CAPS Take 1,200 mg by mouth 2 (two) times daily.        Marland Kitchen omeprazole (PRILOSEC) 20 MG capsule TWICE A DAY  60 capsule  5  . pravastatin (PRAVACHOL) 80 MG tablet  Take 1 tablet (80 mg total) by mouth at bedtime.  30 tablet  4   No current facility-administered medications on file prior to visit.   Allergies  Allergen Reactions  . Tape Itching and Rash  . Actos [Pioglitazone Hydrochloride]   . Buspar [Buspirone Hcl]   . Lipitor [Atorvastatin Calcium]   . Penicillins Rash   Immunization History  Administered Date(s) Administered  . Influenza Whole 09/30/2010  . Influenza-Unspecified 01/04/2014  . Td 01/01/2004  . Tdap 10/17/2011   Prior to Admission medications   Medication Sig Start Date End Date Taking? Authorizing Provider  calcium carbonate (OS-CAL) 600 MG TABS Take 600 mg by mouth 2 (two) times daily with a meal.      Historical Provider, MD  Cholecalciferol (VITAMIN D3) 5000 UNITS CAPS Take 5,000 Units by mouth daily.    Historical Provider, MD  diazepam (VALIUM) 5 MG tablet Take 0.5-1 tablets (2.5-5 mg total) by mouth 2 (two) times daily as needed. anxiety 12/14/13   Vernie Shanks, MD  gabapentin  (NEURONTIN) 800 MG tablet TAKE 1 TABLET THREE TIMES A DAY 02/08/14   Vernie Shanks, MD  HYDROcodone-acetaminophen (NORCO/VICODIN) 5-325 MG per tablet Take 1 tablet by mouth every 6 (six) hours as needed for moderate pain. 12/14/13   Vernie Shanks, MD  metFORMIN (GLUCOPHAGE) 500 MG tablet TAKE 2 TABLETS TWICE A DAY 02/08/14   Vernie Shanks, MD  Milnacipran (SAVELLA) 50 MG TABS tablet Take 2 tablets (100 mg total) by mouth 2 (two) times daily. 12/14/13   Vernie Shanks, MD  Omega-3 Fatty Acids (FISH OIL) 1200 MG CAPS Take 1,200 mg by mouth 2 (two) times daily.      Historical Provider, MD  omeprazole (PRILOSEC) 20 MG capsule TWICE A DAY 01/08/14   Chipper Herb, MD  pravastatin (PRAVACHOL) 40 MG tablet TAKE 1 TABLET BY MOUTH AT BEDTIME 01/12/14   Chipper Herb, MD  pravastatin (PRAVACHOL) 80 MG tablet Take 1 tablet (80 mg total) by mouth at bedtime. 09/09/13   Vernie Shanks, MD  valsartan-hydrochlorothiazide (DIOVAN-HCT) 160-12.5 MG per tablet TAKE 1 TABLET EVERY DAY    Vernie Shanks, MD     ROS: As above in the HPI. All other systems are stable or negative.  OBJECTIVE: APPEARANCE:  Patient in no acute distress.The patient appeared well nourished and normally developed. Acyanotic. Waist: VITAL SIGNS:BP 160/87  Pulse 89  Temp(Src) 98 F (36.7 C) (Oral)  Ht '5\' 5"'  (1.651 m)  Wt 134 lb 6.4 oz (60.963 kg)  BMI 22.37 kg/m2   SKIN: warm and  Dry without overt rashes, tattoos and scars  HEAD and Neck: without JVD, Head and scalp: normal Eyes:No scleral icterus. Fundi normal, eye movements normal. Ears: Auricle normal, canal normal, Tympanic membranes normal, insufflation normal. Nose: normal Throat: normal Neck & thyroid: normal  CHEST & LUNGS: Chest wall: normal Lungs: Clear  CVS: Reveals the PMI to be normally located. Regular rhythm, First and Second Heart sounds are normal,  absence of murmurs, rubs or gallops. Peripheral vasculature: Radial pulses: normal Dorsal pedis  pulses: normal Posterior pulses: normal  ABDOMEN:  Appearance: normal Benign, no organomegaly, no masses, no Abdominal Aortic enlargement. No Guarding , no rebound. No Bruits. Bowel sounds: normal  RECTAL: N/A GU: N/A  EXTREMETIES: nonedematous.  MUSCULOSKELETAL:  Spine: normal Joints: intact  NEUROLOGIC: oriented to time,place and person; nonfocal. Strength is normal Sensory is normal Reflexes are normal Cranial Nerves are normal.  ASSESSMENT: Hyperlipidemia -  Plan: CMP14+EGFR, NMR, lipoprofile  History of colon cancer  Essential hypertension, benign - Plan: CMP14+EGFR, valsartan-hydrochlorothiazide (DIOVAN-HCT) 160-12.5 MG per tablet  Ductal hyperplasia of breast  Diabetic neuropathy - Plan: POCT glycosylated hemoglobin (Hb A1C), CMP14+EGFR  Diabetes mellitus  CKD (chronic kidney disease)  Breast mass, right  GERD (gastroesophageal reflux disease)  Anxiety state, unspecified - Plan: diazepam (VALIUM) 5 MG tablet  Fibromyalgia - Plan: Vit D  25 hydroxy (rtn osteoporosis monitoring)  Osteoarthritis (arthritis due to wear and tear of joints)  PLAN: DASH diet.  Discussed with patient that we are not at goal. And even though she is anxious and has some element of white coat syndrome , she has not been at goal. adjuste medications and doubled the valsartan-HCTZ.  Orders Placed This Encounter  Procedures  . CMP14+EGFR  . NMR, lipoprofile  . Vit D  25 hydroxy (rtn osteoporosis monitoring)  . POCT glycosylated hemoglobin (Hb A1C)   Meds ordered this encounter  Medications  . metFORMIN (GLUCOPHAGE) 500 MG tablet    Sig: Take 500 mg by mouth 2 (two) times daily with a meal. Take 2 in am and one in pm  . diazepam (VALIUM) 5 MG tablet    Sig: Take 0.5-1 tablets (2.5-5 mg total) by mouth 2 (two) times daily as needed. anxiety    Dispense:  30 tablet    Refill:  0  . valsartan-hydrochlorothiazide (DIOVAN-HCT) 160-12.5 MG per tablet    Sig: Take 2 tablets by  mouth daily.    Dispense:  60 tablet    Refill:  4   Medications Discontinued During This Encounter  Medication Reason  . metFORMIN (GLUCOPHAGE) 500 MG tablet Dose change  . Milnacipran (SAVELLA) 50 MG TABS tablet Patient Preference  . pravastatin (PRAVACHOL) 40 MG tablet Discontinued by provider  . diazepam (VALIUM) 5 MG tablet Reorder  . valsartan-hydrochlorothiazide (DIOVAN-HCT) 160-12.5 MG per tablet Reorder   Return in about 2 weeks (around 03/30/2014) for recheck BP.  Kristiane Morsch P. Jacelyn Grip, M.D.

## 2014-03-18 LAB — CMP14+EGFR
ALT: 7 IU/L (ref 0–32)
AST: 13 IU/L (ref 0–40)
Albumin/Globulin Ratio: 1.7 (ref 1.1–2.5)
Albumin: 4.5 g/dL (ref 3.5–4.7)
Alkaline Phosphatase: 68 IU/L (ref 39–117)
BUN/Creatinine Ratio: 21 (ref 11–26)
BUN: 19 mg/dL (ref 8–27)
CO2: 26 mmol/L (ref 18–29)
Calcium: 10.2 mg/dL (ref 8.7–10.3)
Chloride: 100 mmol/L (ref 97–108)
Creatinine, Ser: 0.92 mg/dL (ref 0.57–1.00)
GFR calc Af Amer: 66 mL/min/{1.73_m2} (ref >59)
GFR calc non Af Amer: 57 mL/min/{1.73_m2} — ABNORMAL LOW (ref >59)
Globulin, Total: 2.7 g/dL (ref 1.5–4.5)
Glucose: 92 mg/dL (ref 65–99)
Potassium: 4.5 mmol/L (ref 3.5–5.2)
Sodium: 142 mmol/L (ref 134–144)
Total Bilirubin: 0.2 mg/dL (ref 0.0–1.2)
Total Protein: 7.2 g/dL (ref 6.0–8.5)

## 2014-03-18 LAB — NMR, LIPOPROFILE
Cholesterol: 155 mg/dL (ref <200)
HDL Cholesterol by NMR: 55 mg/dL (ref >=40)
HDL Particle Number: 39.3 umol/L (ref >=30.5)
LDL Particle Number: 850 nmol/L (ref <1000)
LDL Size: 20.8 nm (ref >20.5)
LDLC SERPL CALC-MCNC: 77 mg/dL (ref <100)
LP-IR Score: 41 (ref <=45)
Small LDL Particle Number: 329 nmol/L (ref <=527)
Triglycerides by NMR: 117 mg/dL (ref <150)

## 2014-03-18 LAB — VITAMIN D 25 HYDROXY (VIT D DEFICIENCY, FRACTURES): Vit D, 25-Hydroxy: 49.9 ng/mL (ref 30.0–100.0)

## 2014-03-30 ENCOUNTER — Encounter: Payer: Self-pay | Admitting: Family Medicine

## 2014-03-30 ENCOUNTER — Ambulatory Visit (INDEPENDENT_AMBULATORY_CARE_PROVIDER_SITE_OTHER): Payer: Medicare Other | Admitting: Family Medicine

## 2014-03-30 VITALS — BP 141/65 | HR 84 | Temp 97.6°F | Ht 65.0 in | Wt 131.8 lb

## 2014-03-30 DIAGNOSIS — I10 Essential (primary) hypertension: Secondary | ICD-10-CM

## 2014-03-30 DIAGNOSIS — E785 Hyperlipidemia, unspecified: Secondary | ICD-10-CM

## 2014-03-30 DIAGNOSIS — E119 Type 2 diabetes mellitus without complications: Secondary | ICD-10-CM

## 2014-03-30 DIAGNOSIS — N189 Chronic kidney disease, unspecified: Secondary | ICD-10-CM

## 2014-03-30 NOTE — Patient Instructions (Addendum)
Probiotics Beano for gas.  DASH Diet The DASH diet stands for "Dietary Approaches to Stop Hypertension." It is a healthy eating plan that has been shown to reduce high blood pressure (hypertension) in as little as 14 days, while also possibly providing other significant health benefits. These other health benefits include reducing the risk of breast cancer after menopause and reducing the risk of type 2 diabetes, heart disease, colon cancer, and stroke. Health benefits also include weight loss and slowing kidney failure in patients with chronic kidney disease.  DIET GUIDELINES  Limit salt (sodium). Your diet should contain less than 1500 mg of sodium daily.  Limit refined or processed carbohydrates. Your diet should include mostly whole grains. Desserts and added sugars should be used sparingly.  Include small amounts of heart-healthy fats. These types of fats include nuts, oils, and tub margarine. Limit saturated and trans fats. These fats have been shown to be harmful in the body. CHOOSING FOODS  The following food groups are based on a 2000 calorie diet. See your Registered Dietitian for individual calorie needs. Grains and Grain Products (6 to 8 servings daily)  Eat More Often: Whole-wheat bread, brown rice, whole-grain or wheat pasta, quinoa, popcorn without added fat or salt (air popped).  Eat Less Often: White bread, white pasta, white rice, cornbread. Vegetables (4 to 5 servings daily)  Eat More Often: Fresh, frozen, and canned vegetables. Vegetables may be raw, steamed, roasted, or grilled with a minimal amount of fat.  Eat Less Often/Avoid: Creamed or fried vegetables. Vegetables in a cheese sauce. Fruit (4 to 5 servings daily)  Eat More Often: All fresh, canned (in natural juice), or frozen fruits. Dried fruits without added sugar. One hundred percent fruit juice ( cup [237 mL] daily).  Eat Less Often: Dried fruits with added sugar. Canned fruit in light or heavy syrup. [employer], Fish, and Poultry (2 servings or less daily. One serving is 3 to 4 oz [85-114 g]).  Eat More Often: Ninety percent or leaner ground beef, tenderloin, sirloin. Round cuts of beef, chicken breast, [place] breast. All fish. Grill, bake, or broil your meat. Nothing should be fried.  Eat Less Often/Avoid: Fatty cuts of meat, [place], or chicken leg, thigh, or wing. Fried cuts of meat or fish. Dairy (2 to 3 servings)  Eat More Often: Low-fat or fat-free milk, low-fat plain or light yogurt, reduced-fat or part-skim cheese.  Eat Less Often/Avoid: Milk (whole, 2%).Whole milk yogurt. Full-fat cheeses. Nuts, Seeds, and Legumes (4 to 5 servings per week)  Eat More Often: All without added salt.  Eat Less Often/Avoid: Salted nuts and seeds, canned beans with added salt. Fats and Sweets (limited)  Eat More Often: Vegetable oils, tub margarines without trans fats, sugar-free gelatin. Mayonnaise and salad dressings.  Eat Less Often/Avoid: Coconut oils, palm oils, butter, stick margarine, cream, half and half, cookies, candy, pie. FOR MORE INFORMATION The Dash Diet Eating Plan: www.dashdiet.org Document Released: 12/06/2011 Document Revised: 03/10/2012 Document Reviewed: 12/06/2011 [facility] Patient Information 2014 [place], [place].

## 2014-03-30 NOTE — Progress Notes (Signed)
Patient ID: Dana Burns, female   DOB: 1926/07/03, 80 y.o.   MRN: [id number] SUBJECTIVE: CC: Chief Complaint  Patient presents with  . Follow-up    2 week follow up states no swelling in feet for 2 weeks now     HPI: Patient is here for follow up of hypertension: denies Headache;deniesChest Pain;denies weakness;denies Shortness of Breath or Orthopnea;denies Visual changes;denies palpitations;denies cough;denies pedal edema;denies symptoms of TIA or stroke; admits to Compliance with medications. denies Problems with medications. Feels better with the BP medication adjustment.  Past Medical History  Diagnosis Date  . Diabetes mellitus     6 years  . Peripheral neuropathy   . Fibromyalgia   . Colon cancer   . Complication of anesthesia     agitated when waking up, wakes up shaking, "like  I'm going into shock"  . Hypertension     15 years, reports she has a rapid heartrate   . Depression     husband died  3 months ago, pt. admits depression  currently   . Shortness of breath   . GERD (gastroesophageal reflux disease)   . Headache(784.0)     occasional - "bad headaches"  . Osteoarthritis     in hands & all over, feet  . H/O echocardiogram     done /w [place] in 2011, saw Dr. Percival Spanish for tachycardia   . Hyperlipidemia     6 years   Past Surgical History  Procedure Laterality Date  . Cataract extraction      /w IOL- bilateral   . Colon surgery      For resection of cancer  . Nodule removed      From throat   History   Social History  . Marital Status: Widowed    Spouse Name: N/A    Number of Children: 1  . Years of Education: N/A   Occupational History  . Retired    Social History Main Topics  . Smoking status: Never Smoker   . Smokeless tobacco: Not on file  . Alcohol Use: No  . Drug Use: No  . Sexual Activity: Not on file   Other Topics Concern  . Not on file   Social History Narrative  . No narrative on file   Family History  Problem Relation  Age of Onset  . Cancer Mother     Brain  . Cancer Father     Colorectal  . Heart disease Neg Hx     Early  . Cancer Sister     breast   Current Outpatient Prescriptions on File Prior to Visit  Medication Sig Dispense Refill  . calcium carbonate (OS-CAL) 600 MG TABS Take 600 mg by mouth 2 (two) times daily with a meal.        . Cholecalciferol (VITAMIN D3) 5000 UNITS CAPS Take 5,000 Units by mouth daily.      . diazepam (VALIUM) 5 MG tablet Take 0.5-1 tablets (2.5-5 mg total) by mouth 2 (two) times daily as needed. anxiety  30 tablet  0  . gabapentin (NEURONTIN) 800 MG tablet TAKE 1 TABLET THREE TIMES A DAY  90 tablet  2  . HYDROcodone-acetaminophen (NORCO/VICODIN) 5-325 MG per tablet Take 1 tablet by mouth every 6 (six) hours as needed for moderate pain.  30 tablet  0  . metFORMIN (GLUCOPHAGE) 500 MG tablet Take 500 mg by mouth daily with breakfast. Take 2 in am and one in pm      . Omega-3 Fatty  Acids (FISH OIL) 1200 MG CAPS Take 1,200 mg by mouth 2 (two) times daily.        Marland Kitchen omeprazole (PRILOSEC) 20 MG capsule TWICE A DAY  60 capsule  5  . pravastatin (PRAVACHOL) 80 MG tablet Take 1 tablet (80 mg total) by mouth at bedtime.  30 tablet  4  . valsartan-hydrochlorothiazide (DIOVAN-HCT) 160-12.5 MG per tablet Take 2 tablets by mouth daily.  60 tablet  4   No current facility-administered medications on file prior to visit.   Allergies  Allergen Reactions  . Tape Itching and Rash  . Actos [Pioglitazone Hydrochloride]   . Buspar [Buspirone Hcl]   . Lipitor [Atorvastatin Calcium]   . Penicillins Rash   Immunization History  Administered Date(s) Administered  . Influenza Whole 09/30/2010  . Influenza-Unspecified 01/04/2014  . Td 01/01/2004  . Tdap 10/17/2011   Prior to Admission medications   Medication Sig Start Date End Date Taking? Authorizing Provider  calcium carbonate (OS-CAL) 600 MG TABS Take 600 mg by mouth 2 (two) times daily with a meal.      Historical Provider, MD   Cholecalciferol (VITAMIN D3) 5000 UNITS CAPS Take 5,000 Units by mouth daily.    Historical Provider, MD  diazepam (VALIUM) 5 MG tablet Take 0.5-1 tablets (2.5-5 mg total) by mouth 2 (two) times daily as needed. anxiety 03/16/14   Vernie Shanks, MD  gabapentin (NEURONTIN) 800 MG tablet TAKE 1 TABLET THREE TIMES A DAY 02/08/14   Vernie Shanks, MD  HYDROcodone-acetaminophen (NORCO/VICODIN) 5-325 MG per tablet Take 1 tablet by mouth every 6 (six) hours as needed for moderate pain. 12/14/13   Vernie Shanks, MD  metFORMIN (GLUCOPHAGE) 500 MG tablet Take 500 mg by mouth 2 (two) times daily with a meal. Take 2 in am and one in pm    Historical Provider, MD  Omega-3 Fatty Acids (FISH OIL) 1200 MG CAPS Take 1,200 mg by mouth 2 (two) times daily.      Historical Provider, MD  omeprazole (PRILOSEC) 20 MG capsule TWICE A DAY 01/08/14   Chipper Herb, MD  pravastatin (PRAVACHOL) 80 MG tablet Take 1 tablet (80 mg total) by mouth at bedtime. 09/09/13   Vernie Shanks, MD  valsartan-hydrochlorothiazide (DIOVAN-HCT) 160-12.5 MG per tablet Take 2 tablets by mouth daily. 03/16/14   Vernie Shanks, MD     ROS: As above in the HPI. All other systems are stable or negative.  OBJECTIVE: APPEARANCE:  Patient in no acute distress.The patient appeared well nourished and normally developed. Acyanotic. Waist: VITAL SIGNS:BP 141/65  Pulse 84  Temp(Src) 97.6 F (36.4 C) (Oral)  Ht 5' 5" (1.651 m)  Wt 131 lb 12.8 oz (59.784 kg)  BMI 21.93 kg/m2 BP 135/75 WF  SKIN: warm and  Dry without overt rashes, tattoos and scars  HEAD and Neck: without JVD, Head and scalp: normal Eyes:No scleral icterus. Fundi normal, eye movements normal. Ears: Auricle normal, canal normal, Tympanic membranes normal, insufflation normal. Nose: normal Throat: normal Neck & thyroid: normal  CHEST & LUNGS: Chest wall: normal Lungs: Clear  CVS: Reveals the PMI to be normally located. Regular rhythm, First and Second Heart sounds are  normal,  absence of murmurs, rubs or gallops. Peripheral vasculature: Radial pulses: normal Dorsal pedis pulses: normal Posterior pulses: normal  ABDOMEN:  Appearance: normal Benign, no organomegaly, no masses, no Abdominal Aortic enlargement. No Guarding , no rebound. No Bruits. Bowel sounds: normal  RECTAL: N/A GU: N/A  EXTREMETIES: nonedematous.  MUSCULOSKELETAL:  Spine: kyphosis. Joints: intact  NEUROLOGIC: oriented to time,place and person; nonfocal. Cranial Nerves are normal.   Results for orders placed in visit on 03/16/14  CMP14+EGFR      Result Value Ref Range   Glucose 92  65 - 99 mg/dL   BUN 19  8 - 27 mg/dL   Creatinine, Ser 0.92  0.57 - 1.00 mg/dL   GFR calc non Af Amer 57 (*) >59 mL/min/1.73   GFR calc Af Amer 66  >59 mL/min/1.73   BUN/Creatinine Ratio 21  11 - 26   Sodium 142  134 - 144 mmol/L   Potassium 4.5  3.5 - 5.2 mmol/L   Chloride 100  97 - 108 mmol/L   CO2 26  18 - 29 mmol/L   Calcium 10.2  8.7 - 10.3 mg/dL   Total Protein 7.2  6.0 - 8.5 g/dL   Albumin 4.5  3.5 - 4.7 g/dL   Globulin, Total 2.7  1.5 - 4.5 g/dL   Albumin/Globulin Ratio 1.7  1.1 - 2.5   Total Bilirubin 0.2  0.0 - 1.2 mg/dL   Alkaline Phosphatase 68  39 - 117 IU/L   AST 13  0 - 40 IU/L   ALT 7  0 - 32 IU/L  NMR, LIPOPROFILE      Result Value Ref Range   LDL Particle Number 850  <1000 nmol/L   LDLC SERPL CALC-MCNC 77  <100 mg/dL   HDL Cholesterol by NMR 55  >=40 mg/dL   Triglycerides by NMR 117  <150 mg/dL   Cholesterol 155  <200 mg/dL   HDL Particle Number 39.3  >=30.5 umol/L   Small LDL Particle Number 329  <=527 nmol/L   LDL Size 20.8  >20.5 nm   LP-IR Score 41  <=45  VITAMIN D 25 HYDROXY      Result Value Ref Range   Vit D, 25-Hydroxy 49.9  30.0 - 100.0 ng/mL  POCT GLYCOSYLATED HEMOGLOBIN (HGB A1C)      Result Value Ref Range   Hemoglobin A1C 5.5%      ASSESSMENT:  Essential hypertension, benign - Plan: BMP8+EGFR  Hyperlipidemia  Diabetes mellitus  CKD  (chronic kidney disease) BP acceptable.   PLAN: Adjusted her metformin to one daily. DASH diet given in the AVS.  Keep active and healthy diet.  daily foot check.   Probiotics Beano for flatulence.  Orders Placed This Encounter  Procedures  . BMP8+EGFR   No orders of the defined types were placed in this encounter.   There are no discontinued medications. Return in about 2 months (around 05/30/2014) for Recheck medical problems.  Jasiah Elsen P. Jacelyn Grip, M.D.

## 2014-03-31 LAB — BMP8+EGFR
BUN/Creatinine Ratio: 24 (ref 11–26)
BUN: 26 mg/dL (ref 8–27)
CO2: 25 mmol/L (ref 18–29)
Calcium: 10.1 mg/dL (ref 8.7–10.3)
Chloride: 100 mmol/L (ref 97–108)
Creatinine, Ser: 1.08 mg/dL — ABNORMAL HIGH (ref 0.57–1.00)
GFR calc Af Amer: 54 mL/min/{1.73_m2} — ABNORMAL LOW (ref >59)
GFR calc non Af Amer: 47 mL/min/{1.73_m2} — ABNORMAL LOW (ref >59)
Glucose: 96 mg/dL (ref 65–99)
Potassium: 4.8 mmol/L (ref 3.5–5.2)
Sodium: 141 mmol/L (ref 134–144)

## 2014-03-31 NOTE — Progress Notes (Signed)
Quick Note:  Call patient. Labs normal. No change in plan. ______ 

## 2014-04-14 ENCOUNTER — Ambulatory Visit
Admission: RE | Admit: 2014-04-14 | Discharge: 2014-04-14 | Disposition: A | Discharge status: Home / Self Care | Payer: Medicare Other | Source: Ambulatory Visit

## 2014-04-14 DIAGNOSIS — Z1231 Encounter for screening mammogram for malignant neoplasm of breast: Secondary | ICD-10-CM

## 2014-04-26 ENCOUNTER — Other Ambulatory Visit: Payer: Self-pay | Admitting: Family Medicine

## 2014-04-27 NOTE — Telephone Encounter (Signed)
Last seen 03/30/14  FPW  If approved route to nurse to call into [facility]. [phone]

## 2014-04-29 ENCOUNTER — Other Ambulatory Visit: Payer: Self-pay | Admitting: Orthopedic Surgery

## 2014-04-29 ENCOUNTER — Other Ambulatory Visit: Payer: Self-pay | Admitting: Family Medicine

## 2014-04-29 DIAGNOSIS — M542 Cervicalgia: Secondary | ICD-10-CM

## 2014-04-29 NOTE — Telephone Encounter (Signed)
Rx ready for nurse to Phone in. 

## 2014-04-29 NOTE — Telephone Encounter (Signed)
[facility] [place]

## 2014-05-02 ENCOUNTER — Other Ambulatory Visit: Payer: Medicare Other

## 2014-05-03 ENCOUNTER — Other Ambulatory Visit: Payer: Medicare Other

## 2014-05-03 ENCOUNTER — Other Ambulatory Visit: Payer: Self-pay | Admitting: Family Medicine

## 2014-05-06 ENCOUNTER — Ambulatory Visit
Admission: RE | Admit: 2014-05-06 | Discharge: 2014-05-06 | Disposition: A | Discharge status: Home / Self Care | Payer: Medicare Other | Source: Ambulatory Visit | Attending: Orthopedic Surgery | Admitting: Orthopedic Surgery

## 2014-05-06 DIAGNOSIS — M542 Cervicalgia: Secondary | ICD-10-CM

## 2014-05-10 ENCOUNTER — Other Ambulatory Visit: Payer: Medicare Other

## 2014-05-12 ENCOUNTER — Other Ambulatory Visit: Payer: Medicare Other

## 2014-05-14 ENCOUNTER — Other Ambulatory Visit: Payer: Self-pay | Admitting: *Deleted

## 2014-05-14 MED ORDER — GABAPENTIN 800 MG PO TABS: ORAL_TABLET | ORAL | Status: DC

## 2014-06-02 ENCOUNTER — Encounter: Payer: Self-pay | Admitting: *Deleted

## 2014-06-20 ENCOUNTER — Other Ambulatory Visit: Payer: Self-pay | Admitting: Family Medicine

## 2014-06-21 ENCOUNTER — Ambulatory Visit: Payer: Medicare Other | Admitting: General Practice

## 2014-06-21 ENCOUNTER — Ambulatory Visit (INDEPENDENT_AMBULATORY_CARE_PROVIDER_SITE_OTHER): Payer: Medicare Other | Admitting: Family Medicine

## 2014-06-21 ENCOUNTER — Encounter: Payer: Self-pay | Admitting: Family Medicine

## 2014-06-21 VITALS — BP 144/69 | HR 74 | Temp 96.8°F | Ht 65.0 in | Wt 137.4 lb

## 2014-06-21 DIAGNOSIS — F411 Generalized anxiety disorder: Secondary | ICD-10-CM

## 2014-06-21 DIAGNOSIS — E118 Type 2 diabetes mellitus with unspecified complications: Secondary | ICD-10-CM

## 2014-06-21 DIAGNOSIS — G629 Polyneuropathy, unspecified: Secondary | ICD-10-CM

## 2014-06-21 DIAGNOSIS — E785 Hyperlipidemia, unspecified: Secondary | ICD-10-CM

## 2014-06-21 DIAGNOSIS — IMO0002 Reserved for concepts with insufficient information to code with codable children: Secondary | ICD-10-CM

## 2014-06-21 DIAGNOSIS — G589 Mononeuropathy, unspecified: Secondary | ICD-10-CM

## 2014-06-21 DIAGNOSIS — E1165 Type 2 diabetes mellitus with hyperglycemia: Secondary | ICD-10-CM

## 2014-06-21 DIAGNOSIS — I10 Essential (primary) hypertension: Secondary | ICD-10-CM

## 2014-06-21 LAB — POCT CBC
Granulocyte percent: 71.3 %G (ref 37–80)
HCT, POC: 35.4 % — AB (ref 37.7–47.9)
Hemoglobin: 11.1 g/dL — AB (ref 12.2–16.2)
Lymph, poc: 1.2 (ref 0.6–3.4)
MCH, POC: 25.1 pg — AB (ref 27–31.2)
MCHC: 31.3 g/dL — AB (ref 31.8–35.4)
MCV: 80.1 fL (ref 80–97)
MPV: 7.9 fL (ref 0–99.8)
POC Granulocyte: 4.3 (ref 2–6.9)
POC LYMPH PERCENT: 19.8 %L (ref 10–50)
Platelet Count, POC: 227 10*3/uL (ref 142–424)
RBC: 4.4 M/uL (ref 4.04–5.48)
RDW, POC: 16.7 %
WBC: 6.1 10*3/uL (ref 4.6–10.2)

## 2014-06-21 LAB — POCT GLYCOSYLATED HEMOGLOBIN (HGB A1C): Hemoglobin A1C: 6.5

## 2014-06-21 MED ORDER — METFORMIN HCL 500 MG PO TABS: 500.0000 mg | ORAL_TABLET | Freq: Every day | ORAL | Status: DC

## 2014-06-21 MED ORDER — HYDROCODONE-ACETAMINOPHEN 5-325 MG PO TABS: 1.0000 | ORAL_TABLET | Freq: Four times a day (QID) | ORAL | Status: DC | PRN

## 2014-06-21 MED ORDER — DIAZEPAM 5 MG PO TABS: ORAL_TABLET | ORAL | Status: DC

## 2014-06-21 MED ORDER — PRAVASTATIN SODIUM 80 MG PO TABS: 80.0000 mg | ORAL_TABLET | Freq: Every day | ORAL | Status: DC

## 2014-06-21 MED ORDER — VALSARTAN-HYDROCHLOROTHIAZIDE 160-12.5 MG PO TABS: 1.0000 | ORAL_TABLET | Freq: Every day | ORAL | Status: DC

## 2014-06-21 NOTE — Progress Notes (Signed)
   Subjective:    Patient ID: Dana Burns, female    DOB: [birth year], 80 y.o.   MRN: [id number]  HPI This 80 y.o. female presents for evaluation of routine follow up.  She has hx of diabetes, hypertension, And hyperlipidemia.     Review of Systems No chest pain, SOB, HA, dizziness, vision change, N/V, diarrhea, constipation, dysuria, urinary urgency or frequency, myalgias, arthralgias or rash.     Objective:   Physical Exam  Vital signs noted  Well developed well nourished female.  HEENT - Head atraumatic Normocephalic                Eyes - PERRLA, Conjuctiva - clear Sclera- Clear EOMI                Ears - EAC's Wnl TM's Wnl Gross Hearing WNL                Throat - oropharanx wnl Respiratory - Lungs CTA bilateral Cardiac - RRR S1 and S2 without murmur GI - Abdomen soft Nontender and bowel sounds active x 4 Extremities - No edema. Neuro - Grossly intact.      Assessment & Plan:  HLD (hyperlipidemia) - Plan: pravastatin (PRAVACHOL) 80 MG tablet  Neuropathy - Plan: diazepam (VALIUM) 5 MG tablet, Vitamin B12, Ambulatory referral to Neurology  Essential hypertension, benign - Controlled and continue diovan hct.  Type II or unspecified type diabetes mellitus with unspecified complication, uncontrolled - Plan: metFORMIN (GLUCOPHAGE) 500 MG tablet, POCT CBC, CMP14+EGFR, POCT glycosylated hemoglobin (Hb A1C), TSH, Vit D  25 hydroxy (rtn osteoporosis monitoring)  Anxiety state, unspecified - Plan: diazepam (VALIUM) 5 MG tablet  Follow up in 3 months  Dana Penner FNP

## 2014-06-22 LAB — CMP14+EGFR
ALT: 12 IU/L (ref 0–32)
AST: 16 IU/L (ref 0–40)
Albumin/Globulin Ratio: 2 (ref 1.1–2.5)
Albumin: 4.3 g/dL (ref 3.5–4.7)
Alkaline Phosphatase: 61 IU/L (ref 39–117)
BUN/Creatinine Ratio: 19 (ref 11–26)
BUN: 20 mg/dL (ref 8–27)
CO2: 22 mmol/L (ref 18–29)
Calcium: 9.6 mg/dL (ref 8.7–10.3)
Chloride: 101 mmol/L (ref 97–108)
Creatinine, Ser: 1.04 mg/dL — ABNORMAL HIGH (ref 0.57–1.00)
GFR calc Af Amer: 56 mL/min/{1.73_m2} — ABNORMAL LOW (ref >59)
GFR calc non Af Amer: 49 mL/min/{1.73_m2} — ABNORMAL LOW (ref >59)
Globulin, Total: 2.2 g/dL (ref 1.5–4.5)
Glucose: 96 mg/dL (ref 65–99)
Potassium: 4.8 mmol/L (ref 3.5–5.2)
Sodium: 142 mmol/L (ref 134–144)
Total Bilirubin: <0.2 mg/dL (ref 0.0–1.2)
Total Protein: 6.5 g/dL (ref 6.0–8.5)

## 2014-06-22 LAB — VITAMIN B12: Vitamin B-12: 219 pg/mL (ref 211–946)

## 2014-06-22 LAB — TSH: TSH: 2.75 u[IU]/mL (ref 0.450–4.500)

## 2014-06-22 LAB — VITAMIN D 25 HYDROXY (VIT D DEFICIENCY, FRACTURES): Vit D, 25-Hydroxy: 58.8 ng/mL (ref 30.0–100.0)

## 2014-06-23 ENCOUNTER — Telehealth: Payer: Self-pay | Admitting: Family Medicine

## 2014-06-23 ENCOUNTER — Other Ambulatory Visit: Payer: Self-pay | Admitting: Family Medicine

## 2014-06-23 DIAGNOSIS — E538 Deficiency of other specified B group vitamins: Secondary | ICD-10-CM

## 2014-06-23 MED ORDER — CYANOCOBALAMIN 1000 MCG/ML IJ SOLN: INTRAMUSCULAR | Status: DC

## 2014-06-23 NOTE — Telephone Encounter (Signed)
Dosage corrected to pravastatin 40 mg daily.

## 2014-06-24 LAB — SPECIMEN STATUS REPORT

## 2014-06-24 LAB — LIPID PANEL WITH LDL/HDL RATIO
Cholesterol, Total: 180 mg/dL (ref 100–199)
HDL: 56 mg/dL (ref >39)
LDL Calculated: 96 mg/dL (ref 0–99)
LDl/HDL Ratio: 1.7 ratio units (ref 0.0–3.2)
Triglycerides: 138 mg/dL (ref 0–149)
VLDL Cholesterol Cal: 28 mg/dL (ref 5–40)

## 2014-06-28 ENCOUNTER — Telehealth: Payer: Self-pay | Admitting: Family Medicine

## 2014-06-28 NOTE — Telephone Encounter (Signed)
Message copied by Waverly Ferrari on Mon Jun 28, 2014  3:39 PM ------      Message from: Lysbeth Penner      Created: Wed Jun 23, 2014  5:57 PM       B12 is low so b12 is ordered and sent to pharmacy.  Need to do injections daily for a week then weekly for 4 weeks then monthly.  Her hgbaic is normal and her CMP shows CKD.  She does have anemia and it is stable.  Would not hurt to do anemia panel and FOBT ------

## 2014-06-29 NOTE — Telephone Encounter (Signed)
She is going to begin seeing a doctor closer to her and wants to know if she can do B12 INJ there. Please have nurse discuss labs indepth with her.  Call her at [phone]

## 2014-06-29 NOTE — Telephone Encounter (Signed)
Spoke with patient and she is aware of labs. She is transferring to a practice in [place] that is close to her.

## 2014-07-01 ENCOUNTER — Encounter: Payer: Self-pay | Admitting: Neurology

## 2014-07-01 ENCOUNTER — Ambulatory Visit (INDEPENDENT_AMBULATORY_CARE_PROVIDER_SITE_OTHER): Payer: Medicare Other | Admitting: Neurology

## 2014-07-01 VITALS — BP 157/80 | HR 71 | Ht 66.0 in | Wt 140.0 lb

## 2014-07-01 DIAGNOSIS — G8929 Other chronic pain: Secondary | ICD-10-CM

## 2014-07-01 DIAGNOSIS — G609 Hereditary and idiopathic neuropathy, unspecified: Secondary | ICD-10-CM

## 2014-07-01 DIAGNOSIS — G629 Polyneuropathy, unspecified: Secondary | ICD-10-CM | POA: Insufficient documentation

## 2014-07-01 NOTE — Progress Notes (Signed)
[facility]    Provider:  Dr Janann Colonel Referring Provider: Chipper Herb, MD Primary Care Physician:  Penni Homans, MD  CC:  Peripheral neuropathy  HPI:  Dana Burns is a 80 y.o. female here as a referral from Dr. Laurance Flatten for painful peripheral neuropathy   Notes history of a fall around 1 year ago. [place] [address] and broke her right wrist. Since then has had pain radiating up her whole arm. Notes a continued aching pain in the arm, has limited ROM. Notes pain is predominantly in the thumb and and first 2 digits of the hands. Notes pulling/aching sensation in the right cervical region. Has had therapy for the arm/wrist. Has had a MRI done with Dr Noemi Chapel, showing small rotator cuff tear. Tried injections and 2 courses of prednisone. She has not had an EMG/NCS done in the past.   Currently takes neurontin 800mg  TID for neuropathy and fibromyalgia (states this diagnosed via blood work?) States she has tried Psychologist, clinical in the past with no benefit or unable to tolerate.    Labs pertinent for B12 of 219 and HbA1c of 6.5. Scheduled to start B12 injections soon.   Review of Systems: Out of a complete 14 system review, the patient complains of only the following symptoms, and all other reviewed systems are negative. + sleepiness, headache, numbness, weakness, depression, anxiety, not enough sleep, joint pain, joint swelling, cramps  History   Social History  . Marital Status: Widowed    Spouse Name: N/A    Number of Children: 1  . Years of Education: 9   Occupational History  . Retired    Social History Main Topics  . Smoking status: Never Smoker   . Smokeless tobacco: Never Used  . Alcohol Use: No  . Drug Use: No  . Sexual Activity: Not on file   Other Topics Concern  . Not on file   Social History Narrative   Patient lives at home with daughter.    Patient is retired.    Patient is widowed.    Patient has 1 child.    Patient has a 9th grade education.      Family History  Problem Relation Age of Onset  . Cancer Mother     Brain  . Cancer Father     Colorectal  . Heart disease Neg Hx     Early  . Cancer Sister     breast    Past Medical History  Diagnosis Date  . Diabetes mellitus     6 years  . Peripheral neuropathy   . Fibromyalgia   . Colon cancer   . Complication of anesthesia     agitated when waking up, wakes up shaking, "like  I'm going into shock"  . Hypertension     15 years, reports she has a rapid heartrate   . Depression     husband died  3 months ago, pt. admits depression  currently   . Shortness of breath   . GERD (gastroesophageal reflux disease)   . Headache(784.0)     occasional - "bad headaches"  . Osteoarthritis     in hands & all over, feet  . H/O echocardiogram     done /w [place] in 2011, saw Dr. Percival Spanish for tachycardia   . Hyperlipidemia     6 years    Past Surgical History  Procedure Laterality Date  . Cataract extraction      /w IOL- bilateral   . Colon surgery  For resection of cancer  . Nodule removed      From throat    Current Outpatient Prescriptions  Medication Sig Dispense Refill  . calcium carbonate (OS-CAL) 600 MG TABS Take 600 mg by mouth 2 (two) times daily with a meal.        . Cholecalciferol (VITAMIN D3) 5000 UNITS CAPS Take 5,000 Units by mouth daily.      . diazepam (VALIUM) 5 MG tablet one and a half po bid prn  45 tablet  3  . gabapentin (NEURONTIN) 800 MG tablet TAKE 1 TABLET THREE TIMES A DAY  90 tablet  3  . HYDROcodone-acetaminophen (NORCO/VICODIN) 5-325 MG per tablet Take 1 tablet by mouth every 6 (six) hours as needed for moderate pain.  30 tablet  0  . metFORMIN (GLUCOPHAGE) 500 MG tablet Take 1 tablet (500 mg total) by mouth daily before breakfast.  30 tablet  3  . Omega-3 Fatty Acids (FISH OIL) 1200 MG CAPS Take 1,200 mg by mouth 2 (two) times daily.        Marland Kitchen omeprazole (PRILOSEC) 20 MG capsule TWICE A DAY  60 capsule  4  . pravastatin (PRAVACHOL) 40  MG tablet TAKE 1 TABLET BY MOUTH AT BEDTIME  30 tablet  4  . valsartan-hydrochlorothiazide (DIOVAN-HCT) 160-12.5 MG per tablet Take 1 tablet by mouth daily.  30 tablet  3  . cyanocobalamin (,VITAMIN B-12,) 1000 MCG/ML injection Inject one ML IM daily for a week then weekly for 4 weeks then monthly  10 mL  3   No current facility-administered medications for this visit.    Allergies as of 07/01/2014 - Review Complete 07/01/2014  Allergen Reaction Noted  . Tape Itching and Rash 11/05/2012  . Actos [pioglitazone hydrochloride]  03/17/2011  . Buspar [buspirone hcl]  03/17/2011  . Lipitor [atorvastatin calcium]  03/17/2011  . Penicillins Rash     Vitals: BP 157/80  Pulse 71  Ht 5\' 6"  (1.676 m)  Wt 140 lb (63.504 kg)  BMI 22.61 kg/m2 Last Weight:  Wt Readings from Last 1 Encounters:  07/01/14 140 lb (63.504 kg)   Last Height:   Ht Readings from Last 1 Encounters:  07/01/14 5\' 6"  (1.676 m)     Physical exam: Exam: Gen: NAD, conversant Eyes: anicteric sclerae, moist conjunctivae HENT: Atraumatic, oropharynx clear Neck: Trachea midline; supple,  Lungs: CTA, no wheezing, rales, rhonic                          CV: RRR, no MRG Abdomen: Soft, non-tender;  Extremities: No peripheral edema  Skin: Normal temperature, no rash,  Psych: Appropriate affect, pleasant  Neuro: MS: AA&Ox3, appropriately interactive, normal affect   Attention: WORLD backwards  Speech: fluent w/o paraphasic error  Memory: good recent and remote recall  CN: PERRL, EOMI no nystagmus, no ptosis, sensation intact to LT V1-V3 bilat, face symmetric, no weakness, hearing grossly intact, palate elevates symmetrically, shoulder shrug 5/5 bilat,  tongue protrudes midline, no fasiculations noted.  Motor: mild atrophy noted in right thenar region Strength: Limited ROM right shoulder (chronic patient reports pain related). Otherwise strength grossly 5/5 in all extremities  Coord: rapid alternating and  point-to-point (FNF, HTS) movements intact.  Reflexes: symmetrical, bilat downgoing toes  Sens: decreased LT and PP in distribution of median nerve in the right hand. Descreased LT, PP, vibration in bilateral LE to mid shin -Tinel's -Phalens  Gait: posture, stance, stride and arm-swing normal. Tandem gait intact.  Able to walk on heels and toes. Romberg absent.   Assessment:  After physical and neurologic examination, review of laboratory studies, imaging, neurophysiology testing and pre-existing records, assessment will be reviewed on the problem list.  Plan:  Treatment plan and additional workup will be reviewed under Problem List.  1)Peripheral neuropathy 2)Chronic pain  80y/o woman presenting for initial evaluation of RUE chronic pain resulting from a fall. She has been extensively evaluated by ortho surgery (Dr Noemi Chapel) and received steroid injections and 2 courses of oral prednisone. Presents today for evaluation of possible nerve involvement. Recently found to have elevated Hemoglobin A1c and low B12. RUE symptoms concerning for a possible cervical radiculopathy vs peripheral neuropathy. Suspect that her degenerative shoulder changes are also contributing to her symptoms. LE symptoms consistent with a peripheral neuropathy, likely related to B12 deficiency and DM. Will check EMG/NCS. Can consider increasing gabapentin to 900mg  TID in the future. She is scheduled to start B12 injections. Will benefit from improved blood sugar control.   Jim Like, DO  [facility] [address] [place], [place] [address]  Phone [phone] Fax [phone]

## 2014-07-01 NOTE — Patient Instructions (Signed)
Overall you are doing fairly well but I do want to suggest a few things today:   Remember to drink plenty of fluid, eat healthy meals and do not skip any meals. Try to eat protein with a every meal and eat a healthy snack such as fruit or nuts in between meals. Try to keep a regular sleep-wake schedule and try to exercise daily, particularly in the form of walking, 20-30 minutes a day, if you can.   As far as diagnostic testing:  1)Please schedule an EMG/Nerve conduction study when you check out  We will follow up once the testing is completed. Please call us with any interim questions, concerns, problems, updates or refill requests.   My clinical assistant and will answer any of your questions and relay your messages to me and also relay most of my messages to you.   Our phone number is [phone]. We also have an after hours call service for urgent matters and there is a physician on-call for urgent questions. For any emergencies you know to call 911 or go to the nearest emergency room

## 2014-07-06 ENCOUNTER — Ambulatory Visit: Payer: Medicare Other | Admitting: Family Medicine

## 2014-07-06 ENCOUNTER — Telehealth: Payer: Self-pay | Admitting: Family Medicine

## 2014-07-06 ENCOUNTER — Ambulatory Visit (INDEPENDENT_AMBULATORY_CARE_PROVIDER_SITE_OTHER): Payer: Medicare Other | Admitting: Family Medicine

## 2014-07-06 ENCOUNTER — Encounter: Payer: Self-pay | Admitting: Family Medicine

## 2014-07-06 VITALS — BP 142/78 | HR 76 | Temp 98.3°F | Ht 66.0 in | Wt 139.0 lb

## 2014-07-06 DIAGNOSIS — B019 Varicella without complication: Secondary | ICD-10-CM | POA: Insufficient documentation

## 2014-07-06 DIAGNOSIS — R000 Tachycardia, unspecified: Secondary | ICD-10-CM

## 2014-07-06 DIAGNOSIS — M1990 Unspecified osteoarthritis, unspecified site: Secondary | ICD-10-CM

## 2014-07-06 DIAGNOSIS — Z85038 Personal history of other malignant neoplasm of large intestine: Secondary | ICD-10-CM

## 2014-07-06 DIAGNOSIS — K219 Gastro-esophageal reflux disease without esophagitis: Secondary | ICD-10-CM

## 2014-07-06 DIAGNOSIS — M25519 Pain in unspecified shoulder: Secondary | ICD-10-CM

## 2014-07-06 DIAGNOSIS — E785 Hyperlipidemia, unspecified: Secondary | ICD-10-CM

## 2014-07-06 DIAGNOSIS — B059 Measles without complication: Secondary | ICD-10-CM | POA: Insufficient documentation

## 2014-07-06 DIAGNOSIS — M25511 Pain in right shoulder: Secondary | ICD-10-CM

## 2014-07-06 DIAGNOSIS — E538 Deficiency of other specified B group vitamins: Secondary | ICD-10-CM

## 2014-07-06 DIAGNOSIS — I10 Essential (primary) hypertension: Secondary | ICD-10-CM

## 2014-07-06 MED ORDER — CYANOCOBALAMIN 1000 MCG/ML IJ SOLN
1000.0000 ug | Freq: Once | INTRAMUSCULAR | Status: AC
  Administered 2014-07-06: 1000 ug via INTRAMUSCULAR

## 2014-07-06 MED ORDER — VITAMIN B-12 1000 MCG SL SUBL: 1000.0000 ug | SUBLINGUAL_TABLET | Freq: Every day | SUBLINGUAL | Status: DC

## 2014-07-06 NOTE — Patient Instructions (Addendum)
Needs Vitamin B12 and vitamin D with next labs  Tylenol/acetaminophen ES 500 mg tabs 1 tab twice daily, can take the Hydrocodone for breakthrough pain Salon Pas patches can help shoulder and neck  Start a probiotic such as Digestive Advantage or Phillip's Colon Health or a generic Try Benefiber fiber powder twice a day 64 oz of clear fluids daily  Raise feet above heart for 15 minutes twice a day, try compression hose light weight, [employer] is a good company, minimize sodium  Preventive Care for Adults A healthy lifestyle and preventive care can promote health and wellness. Preventive health guidelines for women include the following key practices.  A routine yearly physical is a good way to check with your health care provider about your health and preventive screening. It is a chance to share any concerns and updates on your health and to receive a thorough exam.  Visit your dentist for a routine exam and preventive care every 6 months. Brush your teeth twice a day and floss once a day. Good oral hygiene prevents tooth decay and gum disease.  The frequency of eye exams is based on your age, health, family medical history, use of contact lenses, and other factors. Follow your health care provider's recommendations for frequency of eye exams.  Eat a healthy diet. Foods like vegetables, fruits, whole grains, low-fat dairy products, and lean protein foods contain the nutrients you need without too many calories. Decrease your intake of foods high in solid fats, added sugars, and salt. Eat the right amount of calories for you.Get information about a proper diet from your health care provider, if necessary.  Regular physical exercise is one of the most important things you can do for your health. Most adults should get at least 150 minutes of moderate-intensity exercise (any activity that increases your heart rate and causes you to sweat) each week. In addition, most adults need muscle-strengthening  exercises on 2 or more days a week.  Maintain a healthy weight. The body mass index (BMI) is a screening tool to identify possible weight problems. It provides an estimate of body fat based on height and weight. Your health care provider can find your BMI, and can help you achieve or maintain a healthy weight.For adults 20 years and older:  A BMI below 18.5 is considered underweight.  A BMI of 18.5 to 24.9 is normal.  A BMI of 25 to 29.9 is considered overweight.  A BMI of 30 and above is considered obese.  Maintain normal blood lipids and cholesterol levels by exercising and minimizing your intake of saturated fat. Eat a balanced diet with plenty of fruit and vegetables. Blood tests for lipids and cholesterol should begin at age 81 and be repeated every 5 years. If your lipid or cholesterol levels are high, you are over 50, or you are at high risk for heart disease, you may need your cholesterol levels checked more frequently.Ongoing high lipid and cholesterol levels should be treated with medicines if diet and exercise are not working.  If you smoke, find out from your health care provider how to quit. If you do not use tobacco, do not start.  Lung cancer screening is recommended for adults aged 7-80 years who are at high risk for developing lung cancer because of a history of smoking. A yearly low-dose CT scan of the lungs is recommended for people who have at least a 30-pack-year history of smoking and are a current smoker or have quit within the past 15  years. A pack year of smoking is smoking an average of 1 pack of cigarettes a day for 1 year (for example: 1 pack a day for 30 years or 2 packs a day for 15 years). Yearly screening should continue until the smoker has stopped smoking for at least 15 years. Yearly screening should be stopped for people who develop a health problem that would prevent them from having lung cancer treatment.  If you are pregnant, do not drink alcohol. If you  are breastfeeding, be very cautious about drinking alcohol. If you are not pregnant and choose to drink alcohol, do not have more than 1 drink per day. One drink is considered to be 12 ounces (355 mL) of beer, 5 ounces (148 mL) of wine, or 1.5 ounces (44 mL) of liquor.  Avoid use of street drugs. Do not share needles with anyone. Ask for help if you need support or instructions about stopping the use of drugs.  High blood pressure causes heart disease and increases the risk of stroke. Your blood pressure should be checked at least every 1 to 2 years. Ongoing high blood pressure should be treated with medicines if weight loss and exercise do not work.  If you are 82-22 years old, ask your health care provider if you should take aspirin to prevent strokes.  Diabetes screening involves taking a blood sample to check your fasting blood sugar level. This should be done once every 3 years, after age 63, if you are within normal weight and without risk factors for diabetes. Testing should be considered at a younger age or be carried out more frequently if you are overweight and have at least 1 risk factor for diabetes.  Breast cancer screening is essential preventive care for women. You should practice "breast self-awareness." This means understanding the normal appearance and feel of your breasts and may include breast self-examination. Any changes detected, no matter how small, should be reported to a health care provider. Women in their 47s and 30s should have a clinical breast exam (CBE) by a health care provider as part of a regular health exam every 1 to 3 years. After age 39, women should have a CBE every year. Starting at age 25, women should consider having a mammogram (breast X-ray test) every year. Women who have a family history of breast cancer should talk to their health care provider about genetic screening. Women at a high risk of breast cancer should talk to their health care providers about  having an MRI and a mammogram every year.  Breast cancer gene (BRCA)-related cancer risk assessment is recommended for women who have family members with BRCA-related cancers. BRCA-related cancers include breast, ovarian, tubal, and peritoneal cancers. Having family members with these cancers may be associated with an increased risk for harmful changes (mutations) in the breast cancer genes BRCA1 and BRCA2. Results of the assessment will determine the need for genetic counseling and BRCA1 and BRCA2 testing.  Routine pelvic exams to screen for cancer are no longer recommended for nonpregnant women who are considered low risk for cancer of the pelvic organs (ovaries, uterus, and vagina) and who do not have symptoms. Ask your health care provider if a screening pelvic exam is right for you.  If you have had past treatment for cervical cancer or a condition that could lead to cancer, you need Pap tests and screening for cancer for at least 20 years after your treatment. If Pap tests have been discontinued, your risk factors (such  as having a new sexual partner) need to be reassessed to determine if screening should be resumed. Some women have medical problems that increase the chance of getting cervical cancer. In these cases, your health care provider may recommend more frequent screening and Pap tests.  The HPV test is an additional test that may be used for cervical cancer screening. The HPV test looks for the virus that can cause the cell changes on the cervix. The cells collected during the Pap test can be tested for HPV. The HPV test could be used to screen women aged 34 years and older, and should be used in women of any age who have unclear Pap test results. After the age of 2, women should have HPV testing at the same frequency as a Pap test.  Colorectal cancer can be detected and often prevented. Most routine colorectal cancer screening begins at the age of 9 years and continues through age 19  years. However, your health care provider may recommend screening at an earlier age if you have risk factors for colon cancer. On a yearly basis, your health care provider may provide home test kits to check for hidden blood in the stool. Use of a small camera at the end of a tube, to directly examine the colon (sigmoidoscopy or colonoscopy), can detect the earliest forms of colorectal cancer. Talk to your health care provider about this at age 9, when routine screening begins. Direct exam of the colon should be repeated every 5-10 years through age 75 years, unless early forms of pre-cancerous polyps or small growths are found.  People who are at an increased risk for hepatitis B should be screened for this virus. You are considered at high risk for hepatitis B if:  You were born in a country where hepatitis B occurs often. Talk with your health care provider about which countries are considered high risk.  Your parents were born in a high-risk country and you have not received a shot to protect against hepatitis B (hepatitis B vaccine).  You have HIV or AIDS.  You use needles to inject street drugs.  You live with, or have sex with, someone who has Hepatitis B.  You get hemodialysis treatment.  You take certain medicines for conditions like cancer, organ transplantation, and autoimmune conditions.  Hepatitis C blood testing is recommended for all people born from [birth year] through 1965 and any individual with known risks for hepatitis C.  Practice safe sex. Use condoms and avoid high-risk sexual practices to reduce the spread of sexually transmitted infections (STIs). STIs include gonorrhea, chlamydia, syphilis, trichomonas, herpes, HPV, and human immunodeficiency virus (HIV). Herpes, HIV, and HPV are viral illnesses that have no cure. They can result in disability, cancer, and death.  You should be screened for sexually transmitted illnesses (STIs) including gonorrhea and chlamydia if:  You  are sexually active and are younger than 24 years.  You are older than 24 years and your health care provider tells you that you are at risk for this type of infection.  Your sexual activity has changed since you were last screened and you are at an increased risk for chlamydia or gonorrhea. Ask your health care provider if you are at risk.  If you are at risk of being infected with HIV, it is recommended that you take a prescription medicine daily to prevent HIV infection. This is called preexposure prophylaxis (PrEP). You are considered at risk if:  You are a heterosexual woman, are sexually active,  and are at increased risk for HIV infection.  You take drugs by injection.  You are sexually active with a partner who has HIV.  Talk with your health care provider about whether you are at high risk of being infected with HIV. If you choose to begin PrEP, you should first be tested for HIV. You should then be tested every 3 months for as long as you are taking PrEP.  Osteoporosis is a disease in which the bones lose minerals and strength with aging. This can result in serious bone fractures or breaks. The risk of osteoporosis can be identified using a bone density scan. Women ages 59 years and over and women at risk for fractures or osteoporosis should discuss screening with their health care providers. Ask your health care provider whether you should take a calcium supplement or vitamin D to reduce the rate of osteoporosis.  Menopause can be associated with physical symptoms and risks. Hormone replacement therapy is available to decrease symptoms and risks. You should talk to your health care provider about whether hormone replacement therapy is right for you.  Use sunscreen. Apply sunscreen liberally and repeatedly throughout the day. You should seek shade when your shadow is shorter than you. Protect yourself by wearing long sleeves, pants, a wide-brimmed hat, and sunglasses year round, whenever  you are outdoors.  Once a month, do a whole body skin exam, using a mirror to look at the skin on your back. Tell your health care provider of new moles, moles that have irregular borders, moles that are larger than a pencil eraser, or moles that have changed in shape or color.  Stay current with required vaccines (immunizations).  Influenza vaccine. All adults should be immunized every year.  Tetanus, diphtheria, and acellular pertussis (Td, Tdap) vaccine. Pregnant women should receive 1 dose of Tdap vaccine during each pregnancy. The dose should be obtained regardless of the length of time since the last dose. Immunization is preferred during the 27th-36th week of gestation. An adult who has not previously received Tdap or who does not know her vaccine status should receive 1 dose of Tdap. This initial dose should be followed by tetanus and diphtheria toxoids (Td) booster doses every 10 years. Adults with an unknown or incomplete history of completing a 3-dose immunization series with Td-containing vaccines should begin or complete a primary immunization series including a Tdap dose. Adults should receive a Td booster every 10 years.  Varicella vaccine. An adult without evidence of immunity to varicella should receive 2 doses or a second dose if she has previously received 1 dose. Pregnant females who do not have evidence of immunity should receive the first dose after pregnancy. This first dose should be obtained before leaving the health care facility. The second dose should be obtained 4-8 weeks after the first dose.  Human papillomavirus (HPV) vaccine. Females aged 13-26 years who have not received the vaccine previously should obtain the 3-dose series. The vaccine is not recommended for use in pregnant females. However, pregnancy testing is not needed before receiving a dose. If a female is found to be pregnant after receiving a dose, no treatment is needed. In that case, the remaining doses  should be delayed until after the pregnancy. Immunization is recommended for any person with an immunocompromised condition through the age of 54 years if she did not get any or all doses earlier. During the 3-dose series, the second dose should be obtained 4-8 weeks after the first dose. The third  dose should be obtained 24 weeks after the first dose and 16 weeks after the second dose.  Zoster vaccine. One dose is recommended for adults aged 87 years or older unless certain conditions are present.  Measles, mumps, and rubella (MMR) vaccine. Adults born before [birth year] generally are considered immune to measles and mumps. Adults born in [birth year] or later should have 1 or more doses of MMR vaccine unless there is a contraindication to the vaccine or there is laboratory evidence of immunity to each of the three diseases. A routine second dose of MMR vaccine should be obtained at least 28 days after the first dose for students attending postsecondary schools, health care workers, or international travelers. People who received inactivated measles vaccine or an unknown type of measles vaccine during 1963-1967 should receive 2 doses of MMR vaccine. People who received inactivated mumps vaccine or an unknown type of mumps vaccine before 1979 and are at high risk for mumps infection should consider immunization with 2 doses of MMR vaccine. For females of childbearing age, rubella immunity should be determined. If there is no evidence of immunity, females who are not pregnant should be vaccinated. If there is no evidence of immunity, females who are pregnant should delay immunization until after pregnancy. Unvaccinated health care workers born before [birth year] who lack laboratory evidence of measles, mumps, or rubella immunity or laboratory confirmation of disease should consider measles and mumps immunization with 2 doses of MMR vaccine or rubella immunization with 1 dose of MMR vaccine.  Pneumococcal 13-valent conjugate (PCV13)  vaccine. When indicated, a person who is uncertain of her immunization history and has no record of immunization should receive the PCV13 vaccine. An adult aged 2 years or older who has certain medical conditions and has not been previously immunized should receive 1 dose of PCV13 vaccine. This PCV13 should be followed with a dose of pneumococcal polysaccharide (PPSV23) vaccine. The PPSV23 vaccine dose should be obtained at least 8 weeks after the dose of PCV13 vaccine. An adult aged 89 years or older who has certain medical conditions and previously received 1 or more doses of PPSV23 vaccine should receive 1 dose of PCV13. The PCV13 vaccine dose should be obtained 1 or more years after the last PPSV23 vaccine dose.  Pneumococcal polysaccharide (PPSV23) vaccine. When PCV13 is also indicated, PCV13 should be obtained first. All adults aged 10 years and older should be immunized. An adult younger than age 65 years who has certain medical conditions should be immunized. Any person who resides in a nursing home or long-term care facility should be immunized. An adult smoker should be immunized. People with an immunocompromised condition and certain other conditions should receive both PCV13 and PPSV23 vaccines. People with human immunodeficiency virus (HIV) infection should be immunized as soon as possible after diagnosis. Immunization during chemotherapy or radiation therapy should be avoided. Routine use of PPSV23 vaccine is not recommended for American Indians, [place] Natives, or people younger than 65 years unless there are medical conditions that require PPSV23 vaccine. When indicated, people who have unknown immunization and have no record of immunization should receive PPSV23 vaccine. One-time revaccination 5 years after the first dose of PPSV23 is recommended for people aged 19-64 years who have chronic kidney failure, nephrotic syndrome, asplenia, or immunocompromised conditions. People who received 1-2  doses of PPSV23 before age 8 years should receive another dose of PPSV23 vaccine at age 75 years or later if at least 5 years have passed since the previous dose. Doses  of PPSV23 are not needed for people immunized with PPSV23 at or after age 51 years.  Meningococcal vaccine. Adults with asplenia or persistent complement component deficiencies should receive 2 doses of quadrivalent meningococcal conjugate (MenACWY-D) vaccine. The doses should be obtained at least 2 months apart. Microbiologists working with certain meningococcal bacteria, [employer] recruits, people at risk during an outbreak, and people who travel to or live in countries with a high rate of meningitis should be immunized. A first-year college student up through age 21 years who is living in a residence hall should receive a dose if she did not receive a dose on or after her 16th birthday. Adults who have certain high-risk conditions should receive one or more doses of vaccine.  Hepatitis A vaccine. Adults who wish to be protected from this disease, have certain high-risk conditions, work with hepatitis A-infected animals, work in hepatitis A research labs, or travel to or work in countries with a high rate of hepatitis A should be immunized. Adults who were previously unvaccinated and who anticipate close contact with an international adoptee during the first 60 days after arrival in the [place] from a country with a high rate of hepatitis A should be immunized.  Hepatitis B vaccine. Adults who wish to be protected from this disease, have certain high-risk conditions, may be exposed to blood or other infectious body fluids, are household contacts or sex partners of hepatitis B positive people, are clients or workers in certain care facilities, or travel to or work in countries with a high rate of hepatitis B should be immunized.  Haemophilus influenzae type b (Hib) vaccine. A previously unvaccinated person with asplenia or sickle cell  disease or having a scheduled splenectomy should receive 1 dose of Hib vaccine. Regardless of previous immunization, a recipient of a hematopoietic stem cell transplant should receive a 3-dose series 6-12 months after her successful transplant. Hib vaccine is not recommended for adults with HIV infection. Preventive Services / Frequency Ages 3 to 39years  Blood pressure check.** / Every 1 to 2 years.  Lipid and cholesterol check.** / Every 5 years beginning at age 65.  Clinical breast exam.** / Every 3 years for women in their 60s and 57s.  BRCA-related cancer risk assessment.** / For women who have family members with a BRCA-related cancer (breast, ovarian, tubal, or peritoneal cancers).  Pap test.** / Every 2 years from ages 63 through 3. Every 3 years starting at age 18 through age 22 or 36 with a history of 3 consecutive normal Pap tests.  HPV screening.** / Every 3 years from ages 29 through ages 79 to 54 with a history of 3 consecutive normal Pap tests.  Hepatitis C blood test.** / For any individual with known risks for hepatitis C.  Skin self-exam. / Monthly.  Influenza vaccine. / Every year.  Tetanus, diphtheria, and acellular pertussis (Tdap, Td) vaccine.** / Consult your health care provider. Pregnant women should receive 1 dose of Tdap vaccine during each pregnancy. 1 dose of Td every 10 years.  Varicella vaccine.** / Consult your health care provider. Pregnant females who do not have evidence of immunity should receive the first dose after pregnancy.  HPV vaccine. / 3 doses over 6 months, if 69 and younger. The vaccine is not recommended for use in pregnant females. However, pregnancy testing is not needed before receiving a dose.  Measles, mumps, rubella (MMR) vaccine.** / You need at least 1 dose of MMR if you were born in 1957 or  later. You may also need a 2nd dose. For females of childbearing age, rubella immunity should be determined. If there is no evidence of  immunity, females who are not pregnant should be vaccinated. If there is no evidence of immunity, females who are pregnant should delay immunization until after pregnancy.  Pneumococcal 13-valent conjugate (PCV13) vaccine.** / Consult your health care provider.  Pneumococcal polysaccharide (PPSV23) vaccine.** / 1 to 2 doses if you smoke cigarettes or if you have certain conditions.  Meningococcal vaccine.** / 1 dose if you are age 22 to 28 years and a Orthoptist living in a residence hall, or have one of several medical conditions, you need to get vaccinated against meningococcal disease. You may also need additional booster doses.  Hepatitis A vaccine.** / Consult your health care provider.  Hepatitis B vaccine.** / Consult your health care provider.  Haemophilus influenzae type b (Hib) vaccine.** / Consult your health care provider. Ages 34 to 64years  Blood pressure check.** / Every 1 to 2 years.  Lipid and cholesterol check.** / Every 5 years beginning at age 87 years.  Lung cancer screening. / Every year if you are aged 55-80 years and have a 30-pack-year history of smoking and currently smoke or have quit within the past 15 years. Yearly screening is stopped once you have quit smoking for at least 15 years or develop a health problem that would prevent you from having lung cancer treatment.  Clinical breast exam.** / Every year after age 10 years.  BRCA-related cancer risk assessment.** / For women who have family members with a BRCA-related cancer (breast, ovarian, tubal, or peritoneal cancers).  Mammogram.** / Every year beginning at age 72 years and continuing for as long as you are in good health. Consult with your health care provider.  Pap test.** / Every 3 years starting at age 70 years through age 31 or 53 years with a history of 3 consecutive normal Pap tests.  HPV screening.** / Every 3 years from ages 51 years through ages 82 to 58 years with a history  of 3 consecutive normal Pap tests.  Fecal occult blood test (FOBT) of stool. / Every year beginning at age 68 years and continuing until age 78 years. You may not need to do this test if you get a colonoscopy every 10 years.  Flexible sigmoidoscopy or colonoscopy.** / Every 5 years for a flexible sigmoidoscopy or every 10 years for a colonoscopy beginning at age 36 years and continuing until age 34 years.  Hepatitis C blood test.** / For all people born from [birth year] through 1963 and any individual with known risks for hepatitis C.  Skin self-exam. / Monthly.  Influenza vaccine. / Every year.  Tetanus, diphtheria, and acellular pertussis (Tdap/Td) vaccine.** / Consult your health care provider. Pregnant women should receive 1 dose of Tdap vaccine during each pregnancy. 1 dose of Td every 10 years.  Varicella vaccine.** / Consult your health care provider. Pregnant females who do not have evidence of immunity should receive the first dose after pregnancy.  Zoster vaccine.** / 1 dose for adults aged 80 years or older.  Measles, mumps, rubella (MMR) vaccine.** / You need at least 1 dose of MMR if you were born in 1957 or later. You may also need a 2nd dose. For females of childbearing age, rubella immunity should be determined. If there is no evidence of immunity, females who are not pregnant should be vaccinated. If there is no evidence of immunity, females  who are pregnant should delay immunization until after pregnancy.  Pneumococcal 13-valent conjugate (PCV13) vaccine.** / Consult your health care provider.  Pneumococcal polysaccharide (PPSV23) vaccine.** / 1 to 2 doses if you smoke cigarettes or if you have certain conditions.  Meningococcal vaccine.** / Consult your health care provider.  Hepatitis A vaccine.** / Consult your health care provider.  Hepatitis B vaccine.** / Consult your health care provider.  Haemophilus influenzae type b (Hib) vaccine.** / Consult your health care  provider. Ages 56 years and over  Blood pressure check.** / Every 1 to 2 years.  Lipid and cholesterol check.** / Every 5 years beginning at age 69 years.  Lung cancer screening. / Every year if you are aged 57-80 years and have a 30-pack-year history of smoking and currently smoke or have quit within the past 15 years. Yearly screening is stopped once you have quit smoking for at least 15 years or develop a health problem that would prevent you from having lung cancer treatment.  Clinical breast exam.** / Every year after age 61 years.  BRCA-related cancer risk assessment.** / For women who have family members with a BRCA-related cancer (breast, ovarian, tubal, or peritoneal cancers).  Mammogram.** / Every year beginning at age 52 years and continuing for as long as you are in good health. Consult with your health care provider.  Pap test.** / Every 3 years starting at age 85 years through age 75 or 60 years with 3 consecutive normal Pap tests. Testing can be stopped between 65 and 70 years with 3 consecutive normal Pap tests and no abnormal Pap or HPV tests in the past 10 years.  HPV screening.** / Every 3 years from ages 20 years through ages 70 or 32 years with a history of 3 consecutive normal Pap tests. Testing can be stopped between 65 and 70 years with 3 consecutive normal Pap tests and no abnormal Pap or HPV tests in the past 10 years.  Fecal occult blood test (FOBT) of stool. / Every year beginning at age 45 years and continuing until age 39 years. You may not need to do this test if you get a colonoscopy every 10 years.  Flexible sigmoidoscopy or colonoscopy.** / Every 5 years for a flexible sigmoidoscopy or every 10 years for a colonoscopy beginning at age 52 years and continuing until age 82 years.  Hepatitis C blood test.** / For all people born from [birth year] through 1963 and any individual with known risks for hepatitis C.  Osteoporosis screening.** / A one-time screening for  women ages 39 years and over and women at risk for fractures or osteoporosis.  Skin self-exam. / Monthly.  Influenza vaccine. / Every year.  Tetanus, diphtheria, and acellular pertussis (Tdap/Td) vaccine.** / 1 dose of Td every 10 years.  Varicella vaccine.** / Consult your health care provider.  Zoster vaccine.** / 1 dose for adults aged 68 years or older.  Pneumococcal 13-valent conjugate (PCV13) vaccine.** / Consult your health care provider.  Pneumococcal polysaccharide (PPSV23) vaccine.** / 1 dose for all adults aged 77 years and older.  Meningococcal vaccine.** / Consult your health care provider.  Hepatitis A vaccine.** / Consult your health care provider.  Hepatitis B vaccine.** / Consult your health care provider.  Haemophilus influenzae type b (Hib) vaccine.** / Consult your health care provider. ** Family history and personal history of risk and conditions may change your health care provider's recommendations. Document Released: 02/12/2002 Document Revised: 12/22/2013 Document Reviewed: 05/14/2011 Exit[employer] Patient Information 2015  [employer]. This information is not intended to replace advice given to you by your health care provider. Make sure you discuss any questions you have with your health care provider.

## 2014-07-06 NOTE — Progress Notes (Signed)
Patient ID: Dana Burns, female   DOB: January 19, 1926, 80 y.o.   MRN: [id number] Dana Burns [id number] [birth year] 07/06/2014      Progress Note-Follow Up  Subjective  Chief Complaint  Chief Complaint  Patient presents with  . Establish Care    new patient    HPI  Patient is a 80 year old female in today for routine medical care. She is in today to establish care. She is struggling with significant pain and debility in her right arm. She fell in September and fractured her wrist and has been struggling with pain, decreased mobility, paresthesias and weakness ever since. She has been seeing Raliegh Ip. She has developed some symptoms of frozen shoulder and has undergone some PT which has been marginally helpful. Struggles with pedal edema intermittently and it has been worse with her decreased mobility. She is scheduled for EMG at the end of the month with [facility] to evaluate the symptoms in her right arm more fully. She has been given Prednisone at times for her pain which has been helpful somewhat. Denies CP/palp/SOB/HA/congestion/fevers/GI or GU c/o. Taking meds as prescribed  Past Medical History  Diagnosis Date  . Diabetes mellitus     6 years  . Peripheral neuropathy   . Fibromyalgia   . Colon cancer   . Complication of anesthesia     agitated when waking up, wakes up shaking, "like  I'm going into shock"  . Hypertension     15 years, reports she has a rapid heartrate   . Depression     husband died  3 months ago, pt. admits depression  currently   . Shortness of breath   . GERD (gastroesophageal reflux disease)   . Headache(784.0)     occasional - "bad headaches"  . Osteoarthritis     in hands & all over, feet  . H/O echocardiogram     done /w [place] in 2011, saw Dr. Percival Spanish for tachycardia   . Hyperlipidemia     6 years  . Chicken pox as a child  . Measles as a child  . Allergy     sneeze, rhinorrhea in am    Past Surgical History  Procedure Laterality  Date  . Cataract extraction      /w IOL- bilateral   . Colon surgery      For resection of cancer  . Nodule removed      From throat  . Breast surgery  2013    right- benign    Family History  Problem Relation Age of Onset  . Cancer Mother     Brain  . Cancer Father     Colorectal  . Heart disease Neg Hx     Early  . Cancer Sister     breast  . Other Brother     pacemaker  . Arthritis Sister   . Emphysema Brother   . Cancer Brother   . COPD Brother   . Neuropathy Daughter   . Fibromyalgia Daughter     History   Social History  . Marital Status: Widowed    Spouse Name: N/A    Number of Children: 1  . Years of Education: 9   Occupational History  . Retired    Social History Main Topics  . Smoking status: Never Smoker   . Smokeless tobacco: Never Used  . Alcohol Use: No  . Drug Use: No  . Sexual Activity: No     Comment: lives with  daughter. no dietary restrictions   Other Topics Concern  . Not on file   Social History Narrative   Patient lives at home with daughter.    Patient is retired.    Patient is widowed.    Patient has 1 child.    Patient has a 9th grade education.     Current Outpatient Prescriptions on File Prior to Visit  Medication Sig Dispense Refill  . calcium carbonate (OS-CAL) 600 MG TABS Take 600 mg by mouth 2 (two) times daily with a meal.        . Cholecalciferol (VITAMIN D3) 5000 UNITS CAPS Take 5,000 Units by mouth daily.      . cyanocobalamin (,VITAMIN B-12,) 1000 MCG/ML injection Inject one ML IM daily for a week then weekly for 4 weeks then monthly  10 mL  3  . diazepam (VALIUM) 5 MG tablet one and a half po bid prn  45 tablet  3  . gabapentin (NEURONTIN) 800 MG tablet TAKE 1 TABLET THREE TIMES A DAY  90 tablet  3  . HYDROcodone-acetaminophen (NORCO/VICODIN) 5-325 MG per tablet Take 1 tablet by mouth every 6 (six) hours as needed for moderate pain.  30 tablet  0  . metFORMIN (GLUCOPHAGE) 500 MG tablet Take 1 tablet (500 mg  total) by mouth daily before breakfast.  30 tablet  3  . Omega-3 Fatty Acids (FISH OIL) 1200 MG CAPS Take 1,200 mg by mouth 2 (two) times daily.        Marland Kitchen omeprazole (PRILOSEC) 20 MG capsule TWICE A DAY  60 capsule  4  . pravastatin (PRAVACHOL) 40 MG tablet TAKE 1 TABLET BY MOUTH AT BEDTIME  30 tablet  4  . valsartan-hydrochlorothiazide (DIOVAN-HCT) 160-12.5 MG per tablet Take 1 tablet by mouth daily.  30 tablet  3   No current facility-administered medications on file prior to visit.    Allergies  Allergen Reactions  . Tape Itching and Rash  . Actos [Pioglitazone Hydrochloride]   . Buspar [Buspirone Hcl]   . Lipitor [Atorvastatin Calcium]   . Penicillins Rash    Review of Systems  Review of Systems  Constitutional: Negative for fever, chills and malaise/fatigue.  HENT: Negative for congestion, hearing loss and nosebleeds.   Eyes: Negative for discharge.  Respiratory: Negative for cough, sputum production, shortness of breath and wheezing.   Cardiovascular: Negative for chest pain, palpitations and leg swelling.  Gastrointestinal: Negative for heartburn, nausea, vomiting, abdominal pain, diarrhea, constipation and blood in stool.  Genitourinary: Negative for dysuria, urgency, frequency and hematuria.  Musculoskeletal: Positive for joint pain. Negative for back pain, falls and myalgias.       Right shoulder and arm pain  Skin: Negative for rash.  Neurological: Negative for dizziness, tremors, sensory change, focal weakness, loss of consciousness, weakness and headaches.  Endo/Heme/Allergies: Negative for polydipsia. Does not bruise/bleed easily.  Psychiatric/Behavioral: Negative for depression and suicidal ideas. The patient is not nervous/anxious and does not have insomnia.     Objective  BP 142/78  Pulse 76  Temp(Src) 98.3 F (36.8 C) (Oral)  Ht 5\' 6"  (1.676 m)  Wt 139 lb 0.6 oz (63.068 kg)  BMI 22.45 kg/m2  SpO2 97%  Physical Exam  Physical Exam  Constitutional: She  is oriented to person, place, and time and well-developed, well-nourished, and in no distress. No distress.  HENT:  Head: Normocephalic and atraumatic.  Eyes: Conjunctivae are normal.  Neck: Neck supple. No thyromegaly present.  Cardiovascular: Normal rate, regular rhythm and  normal heart sounds.   No murmur heard. Pulmonary/Chest: Effort normal and breath sounds normal. She has no wheezes.  Abdominal: She exhibits no distension and no mass.  Musculoskeletal: She exhibits no edema.  Lymphadenopathy:    She has no cervical adenopathy.  Neurological: She is alert and oriented to person, place, and time.  Skin: Skin is warm and dry. No rash noted. She is not diaphoretic.  Psychiatric: Memory, affect and judgment normal.    Lab Results  Component Value Date   TSH 2.750 06/21/2014   Lab Results  Component Value Date   WBC 6.1 06/21/2014   HGB 11.1* 06/21/2014   HCT 35.4* 06/21/2014   MCV 80.1 06/21/2014   PLT 253 10/17/2012   Lab Results  Component Value Date   CREATININE 1.04* 06/21/2014   BUN 20 06/21/2014   NA 142 06/21/2014   K 4.8 06/21/2014   CL 101 06/21/2014   CO2 22 06/21/2014   Lab Results  Component Value Date   ALT 12 06/21/2014   AST 16 06/21/2014   ALKPHOS 61 06/21/2014   BILITOT <0.2 06/21/2014   Lab Results  Component Value Date   CHOL 155 03/16/2014   Lab Results  Component Value Date   HDL 56 06/21/2014   Lab Results  Component Value Date   LDLCALC 96 06/21/2014   Lab Results  Component Value Date   TRIG 138 06/21/2014   No results found for this basename: CHOLHDL     Assessment & Plan  Essential hypertension, benign Well controlled, no changes to meds. Encouraged heart healthy diet such as the DASH diet and exercise as tolerated.   Diabetes mellitus Most recent HGBA1C acceptable, continue current meds and check with  Next blood draw  GERD (gastroesophageal reflux disease) Avoid offending foods, start probiotics. Do not eat large meals in late  evening and consider raising head of bed.   UNSPECIFIED TACHYCARDIA RRR today  Hyperlipidemia Tolerating statin, encouraged heart healthy diet, avoid trans fats, minimize simple carbs and saturated fats. Increase exercise as tolerated  Vitamin B 12 deficiency Recently diagnosed given shot today and then intrinsic factor is checked. Started on SL vitamin B 12 daily  Pain of right shoulder region She fractured right wrist last September and has been struggling with pain and debilty ever since, has developed some weakness and decreased ROM motion and has developed some symptoms of frozen shoulder. Is following with Raliegh Ip, Dr Noemi Chapel and Dr Layne Benton have been working with her. Has just been referred to [facility], Dr Janann Colonel for EMG which is scheduled for 07/27/14. They are ruling out the possiblity of nerve damage contributing to her pain. May benefit from work with sports medicine and more PT if nerve damage is ruled out.

## 2014-07-06 NOTE — Progress Notes (Signed)
Pre visit review using our clinic review tool, if applicable. No additional management support is needed unless otherwise documented below in the visit note. 

## 2014-07-06 NOTE — Assessment & Plan Note (Signed)
Well controlled, no changes to meds. Encouraged heart healthy diet such as the DASH diet and exercise as tolerated.  °

## 2014-07-06 NOTE — Telephone Encounter (Signed)
Relevant patient education assigned to patient using Emmi. ° °

## 2014-07-07 ENCOUNTER — Encounter: Payer: Self-pay | Admitting: Family Medicine

## 2014-07-07 DIAGNOSIS — E538 Deficiency of other specified B group vitamins: Secondary | ICD-10-CM | POA: Insufficient documentation

## 2014-07-07 DIAGNOSIS — M25511 Pain in right shoulder: Secondary | ICD-10-CM

## 2014-07-07 HISTORY — DX: Pain in right shoulder: M25.511

## 2014-07-07 HISTORY — DX: Deficiency of other specified B group vitamins: E53.8

## 2014-07-07 NOTE — Assessment & Plan Note (Signed)
Most recent HGBA1C acceptable, continue current meds and check with  Next blood draw

## 2014-07-07 NOTE — Assessment & Plan Note (Signed)
RRR today 

## 2014-07-07 NOTE — Assessment & Plan Note (Signed)
Diagnosed at age 80, had partial colectomy and chemotherapy at that time.   Sees Dr Wynetta Emery of GI for surveillance colonoscopies but her most recent one was normal so she has been told she may not require another one.

## 2014-07-07 NOTE — Assessment & Plan Note (Signed)
Avoid offending foods, start probiotics. Do not eat large meals in late evening and consider raising head of bed.  

## 2014-07-07 NOTE — Assessment & Plan Note (Signed)
She fractured right wrist last September and has been struggling with pain and debilty ever since, has developed some weakness and decreased ROM motion and has developed some symptoms of frozen shoulder. Is following with Raliegh Ip, Dr Noemi Chapel and Dr Layne Benton have been working with her. Has just been referred to [facility], Dr Janann Colonel for EMG which is scheduled for 07/27/14. They are ruling out the possiblity of nerve damage contributing to her pain. May benefit from work with sports medicine and more PT if nerve damage is ruled out.

## 2014-07-07 NOTE — Assessment & Plan Note (Signed)
Recently diagnosed given shot today and then intrinsic factor is checked. Started on SL vitamin B 12 daily

## 2014-07-07 NOTE — Assessment & Plan Note (Signed)
Tolerating statin, encouraged heart healthy diet, avoid trans fats, minimize simple carbs and saturated fats. Increase exercise as tolerated 

## 2014-07-08 LAB — INTRINSIC FACTOR ANTIBODIES: Intrinsic Factor: POSITIVE — AB

## 2014-07-12 ENCOUNTER — Ambulatory Visit (INDEPENDENT_AMBULATORY_CARE_PROVIDER_SITE_OTHER): Payer: Medicare Other | Admitting: Family

## 2014-07-12 DIAGNOSIS — E538 Deficiency of other specified B group vitamins: Secondary | ICD-10-CM

## 2014-07-12 MED ORDER — CYANOCOBALAMIN 1000 MCG/ML IJ SOLN
1000.0000 ug | Freq: Once | INTRAMUSCULAR | Status: AC
  Administered 2014-07-12: 1000 ug via INTRAMUSCULAR

## 2014-07-20 ENCOUNTER — Ambulatory Visit (INDEPENDENT_AMBULATORY_CARE_PROVIDER_SITE_OTHER): Payer: Medicare Other | Admitting: *Deleted

## 2014-07-20 DIAGNOSIS — E538 Deficiency of other specified B group vitamins: Secondary | ICD-10-CM

## 2014-07-20 MED ORDER — CYANOCOBALAMIN 1000 MCG/ML IJ SOLN
1000.0000 ug | Freq: Once | INTRAMUSCULAR | Status: AC
  Administered 2014-07-20: 1000 ug via INTRAMUSCULAR

## 2014-07-26 ENCOUNTER — Ambulatory Visit (INDEPENDENT_AMBULATORY_CARE_PROVIDER_SITE_OTHER): Payer: Medicare Other

## 2014-07-26 DIAGNOSIS — E538 Deficiency of other specified B group vitamins: Secondary | ICD-10-CM

## 2014-07-26 MED ORDER — CYANOCOBALAMIN 1000 MCG/ML IJ SOLN
1000.0000 ug | Freq: Once | INTRAMUSCULAR | Status: AC
  Administered 2014-07-26: 1000 ug via INTRAMUSCULAR

## 2014-07-26 NOTE — Progress Notes (Signed)
   Subjective:    Patient ID: Dana Burns, female    DOB: [birth year], 80 y.o.   MRN: [id number]  HPI    Review of Systems     Objective:   Physical Exam        Assessment & Plan:  Patient came in today for her B12 injection. Pt tolerated injection well

## 2014-07-27 ENCOUNTER — Encounter (INDEPENDENT_AMBULATORY_CARE_PROVIDER_SITE_OTHER): Payer: Self-pay

## 2014-07-27 ENCOUNTER — Ambulatory Visit (INDEPENDENT_AMBULATORY_CARE_PROVIDER_SITE_OTHER): Payer: Medicare Other | Admitting: Neurology

## 2014-07-27 DIAGNOSIS — G609 Hereditary and idiopathic neuropathy, unspecified: Secondary | ICD-10-CM

## 2014-07-27 DIAGNOSIS — G63 Polyneuropathy in diseases classified elsewhere: Secondary | ICD-10-CM

## 2014-07-27 DIAGNOSIS — Z0289 Encounter for other administrative examinations: Secondary | ICD-10-CM

## 2014-07-27 NOTE — Procedures (Signed)
     HISTORY:  Dana Burns is an 80 year old patient with a history of a fall on 09/10/2013 where she fractured her right wrist. Since that time, she has also had some pain in the right shoulder and right neck area with some radiation down to the elbow level. The patient is being evaluated for a possible neuropathy or a cervical radiculopathy. The patient does have a known history of a peripheral neuropathy affecting the lower extremities.  NERVE CONDUCTION STUDIES:  Nerve conduction studies were performed on the right upper extremity. The distal motor latencies and motor amplitudes for the radial and ulnar nerves were within normal limits. The distal motor latency for the median nerve was slightly prolonged, with a normal motor amplitude. The F wave latencies and nerve conduction velocities for these nerves were also normal. The sensory latencies for the median, radial, and ulnar nerves were normal.  Nerve conduction studies were performed on both lower extremities. The distal motor latencies for the peroneal and posterior tibial nerves were normal bilaterally, with low motor amplitudes for these nerves bilaterally. The nerve conduction velocities for the peroneal nerves were normal above the knee, slightly slowed below the knee on the right, normal on the left. The nerve conduction velocities for the posterior tibial nerves were normal bilaterally. The peroneal sensory latencies were absent bilaterally, and the H reflex latencies were absent bilaterally.  EMG STUDIES:  EMG study was performed on the right upper extremity:  The first dorsal interosseous muscle reveals 2 to 4 K units with full recruitment. No fibrillations or positive waves were noted. The abductor pollicis brevis muscle reveals 2 to 5 K units with slightly reduced recruitment. No fibrillations or positive waves were noted. The extensor indicis proprius muscle reveals 1 to 3 K units with full recruitment. No fibrillations or  positive waves were noted. The pronator teres muscle reveals 2 to 3 K units with full recruitment. No fibrillations or positive waves were noted. The biceps muscle reveals 1 to 2 K units with full recruitment. No fibrillations or positive waves were noted. The triceps muscle reveals 2 to 4 K units with full recruitment. No fibrillations or positive waves were noted. The anterior deltoid muscle reveals 2 to 3 K units with full recruitment. No fibrillations or positive waves were noted. The cervical paraspinal muscles were tested at 2 levels. No abnormalities of insertional activity were seen at either level tested. There was good relaxation.   IMPRESSION:  Nerve conduction studies done on the right upper extremity and both lower extremities shows evidence of a primarily axonal peripheral neuropathy of moderate severity. There appears to be a borderline right carpal tunnel syndrome. EMG evaluation of the right upper extremity shows no evidence of an overlying cervical radiculopathy.  Jill Alexanders MD 07/27/2014 11:03 AM  [facility] [address] [place], [place] [address]  Phone [phone] Fax [phone]

## 2014-08-03 ENCOUNTER — Ambulatory Visit: Payer: Medicare Other

## 2014-08-03 ENCOUNTER — Telehealth: Payer: Self-pay

## 2014-08-03 NOTE — Telephone Encounter (Signed)
Verbal per md:  Ok to do one today, then biweekly for 1 month, then monthly.  Marj spoke to patient and she is going to come in next week

## 2014-08-03 NOTE — Telephone Encounter (Signed)
Please advise if patient needs a B12 injection today? This would be her 5th week in a row?

## 2014-08-10 ENCOUNTER — Ambulatory Visit (INDEPENDENT_AMBULATORY_CARE_PROVIDER_SITE_OTHER): Payer: Medicare Other

## 2014-08-10 ENCOUNTER — Ambulatory Visit: Payer: Medicare Other

## 2014-08-10 DIAGNOSIS — E538 Deficiency of other specified B group vitamins: Secondary | ICD-10-CM

## 2014-08-10 MED ORDER — CYANOCOBALAMIN 1000 MCG/ML IJ SOLN
1000.0000 ug | Freq: Once | INTRAMUSCULAR | Status: AC
  Administered 2014-08-10: 1000 ug via INTRAMUSCULAR

## 2014-08-10 NOTE — Progress Notes (Signed)
   Subjective:    Patient ID: Dana Burns, female    DOB: 29-Jan-1926, 80 y.o.   MRN: [id number]  HPI    Review of Systems     Objective:   Physical Exam        Assessment & Plan:  Patient came in today for her B12 injection. Patient tolerated injection well. Pt was informed to schedule a nurse visit for 2 weeks for another B12 injection and then 1 a month. Pt voiced understanding (explained 3 times)

## 2014-08-24 ENCOUNTER — Ambulatory Visit: Payer: Medicare Other

## 2014-08-24 ENCOUNTER — Ambulatory Visit (INDEPENDENT_AMBULATORY_CARE_PROVIDER_SITE_OTHER): Payer: Medicare Other

## 2014-08-24 DIAGNOSIS — E538 Deficiency of other specified B group vitamins: Secondary | ICD-10-CM

## 2014-08-24 DIAGNOSIS — Z23 Encounter for immunization: Secondary | ICD-10-CM

## 2014-08-24 MED ORDER — CYANOCOBALAMIN 1000 MCG/ML IJ SOLN
1000.0000 ug | Freq: Once | INTRAMUSCULAR | Status: AC
  Administered 2014-08-24: 1000 ug via INTRAMUSCULAR

## 2014-08-24 NOTE — Progress Notes (Signed)
   Subjective:    Patient ID: Dana Burns, female    DOB: 12/06/1926, 80 y.o.   MRN: [id number]  HPI    Review of Systems     Objective:   Physical Exam        Assessment & Plan:  Patient came in today for her B12 injection (was reminded to start monthly injections) and a flu vaccination. Pt tolerated well

## 2014-08-30 ENCOUNTER — Telehealth: Payer: Self-pay | Admitting: Family Medicine

## 2014-08-30 NOTE — Telephone Encounter (Signed)
Discussed results with patient. She is no longer a patient here due to distance from home. She is currently seeing [facility]. Advised her to follow up with them as planned and sooner if necessary.

## 2014-08-30 NOTE — Telephone Encounter (Signed)
Neurology consult and Nerve conduction test shows she has moderate peripheral neuropathy in her upper and lower extremities and not due to pinched nerves.  This will hopefully improve also with b12 shots.

## 2014-09-07 ENCOUNTER — Other Ambulatory Visit (INDEPENDENT_AMBULATORY_CARE_PROVIDER_SITE_OTHER): Payer: Medicare Other

## 2014-09-07 DIAGNOSIS — R000 Tachycardia, unspecified: Secondary | ICD-10-CM

## 2014-09-07 DIAGNOSIS — E785 Hyperlipidemia, unspecified: Secondary | ICD-10-CM

## 2014-09-07 DIAGNOSIS — I10 Essential (primary) hypertension: Secondary | ICD-10-CM

## 2014-09-07 DIAGNOSIS — E119 Type 2 diabetes mellitus without complications: Secondary | ICD-10-CM

## 2014-09-07 DIAGNOSIS — E538 Deficiency of other specified B group vitamins: Secondary | ICD-10-CM

## 2014-09-07 LAB — LIPID PANEL
Cholesterol: 174 mg/dL (ref 0–200)
HDL: 43.2 mg/dL (ref >39.00)
LDL CALC: 94 mg/dL (ref 0–99)
NONHDL: 130.8
Total CHOL/HDL Ratio: 4
Triglycerides: 186 mg/dL — ABNORMAL HIGH (ref 0.0–149.0)
VLDL: 37.2 mg/dL (ref 0.0–40.0)

## 2014-09-07 LAB — CBC WITH DIFFERENTIAL/PLATELET
Basophils Absolute: 0 10*3/uL (ref 0.0–0.1)
Basophils Relative: 0.2 % (ref 0.0–3.0)
EOS PCT: 2.1 % (ref 0.0–5.0)
Eosinophils Absolute: 0.1 10*3/uL (ref 0.0–0.7)
HCT: 35.7 % — ABNORMAL LOW (ref 36.0–46.0)
Hemoglobin: 11.6 g/dL — ABNORMAL LOW (ref 12.0–15.0)
LYMPHS PCT: 21.2 % (ref 12.0–46.0)
Lymphs Abs: 1 10*3/uL (ref 0.7–4.0)
MCHC: 32.5 g/dL (ref 30.0–36.0)
MCV: 81 fl (ref 78.0–100.0)
MONOS PCT: 10 % (ref 3.0–12.0)
Monocytes Absolute: 0.5 10*3/uL (ref 0.1–1.0)
NEUTROS PCT: 66.5 % (ref 43.0–77.0)
Neutro Abs: 3.3 10*3/uL (ref 1.4–7.7)
Platelets: 239 10*3/uL (ref 150.0–400.0)
RBC: 4.4 Mil/uL (ref 3.87–5.11)
RDW: 17.2 % — ABNORMAL HIGH (ref 11.5–15.5)
WBC: 4.9 10*3/uL (ref 4.0–10.5)

## 2014-09-07 LAB — HEPATIC FUNCTION PANEL
ALT: 11 U/L (ref 0–35)
AST: 17 U/L (ref 0–37)
Albumin: 3.8 g/dL (ref 3.5–5.2)
Alkaline Phosphatase: 54 U/L (ref 39–117)
BILIRUBIN TOTAL: 0.4 mg/dL (ref 0.2–1.2)
Bilirubin, Direct: 0 mg/dL (ref 0.0–0.3)
Total Protein: 7.2 g/dL (ref 6.0–8.3)

## 2014-09-07 LAB — HEMOGLOBIN A1C: HEMOGLOBIN A1C: 6.9 % — AB (ref 4.6–6.5)

## 2014-09-07 LAB — RENAL FUNCTION PANEL
Albumin: 3.8 g/dL (ref 3.5–5.2)
BUN: 20 mg/dL (ref 6–23)
CO2: 31 mEq/L (ref 19–32)
CREATININE: 1.1 mg/dL (ref 0.4–1.2)
Calcium: 9.8 mg/dL (ref 8.4–10.5)
Chloride: 105 mEq/L (ref 96–112)
GFR: 52.19 mL/min — ABNORMAL LOW (ref >60.00)
GLUCOSE: 113 mg/dL — AB (ref 70–99)
PHOSPHORUS: 3.4 mg/dL (ref 2.3–4.6)
Potassium: 5 mEq/L (ref 3.5–5.1)
SODIUM: 141 meq/L (ref 135–145)

## 2014-09-07 LAB — HM DIABETES EYE EXAM

## 2014-09-07 LAB — TSH: TSH: 1.57 u[IU]/mL (ref 0.35–4.50)

## 2014-09-09 ENCOUNTER — Other Ambulatory Visit: Payer: Self-pay | Admitting: Family Medicine

## 2014-09-14 ENCOUNTER — Ambulatory Visit (INDEPENDENT_AMBULATORY_CARE_PROVIDER_SITE_OTHER): Payer: Medicare Other | Admitting: Family Medicine

## 2014-09-14 ENCOUNTER — Telehealth: Payer: Self-pay

## 2014-09-14 ENCOUNTER — Encounter: Payer: Self-pay | Admitting: Family Medicine

## 2014-09-14 VITALS — BP 130/76 | HR 89 | Temp 98.5°F | Ht 66.0 in | Wt 139.2 lb

## 2014-09-14 DIAGNOSIS — Z23 Encounter for immunization: Secondary | ICD-10-CM

## 2014-09-14 DIAGNOSIS — F411 Generalized anxiety disorder: Secondary | ICD-10-CM

## 2014-09-14 DIAGNOSIS — E785 Hyperlipidemia, unspecified: Secondary | ICD-10-CM

## 2014-09-14 DIAGNOSIS — G589 Mononeuropathy, unspecified: Secondary | ICD-10-CM

## 2014-09-14 DIAGNOSIS — G609 Hereditary and idiopathic neuropathy, unspecified: Secondary | ICD-10-CM

## 2014-09-14 DIAGNOSIS — N189 Chronic kidney disease, unspecified: Secondary | ICD-10-CM

## 2014-09-14 DIAGNOSIS — E1122 Type 2 diabetes mellitus with diabetic chronic kidney disease: Secondary | ICD-10-CM

## 2014-09-14 DIAGNOSIS — K219 Gastro-esophageal reflux disease without esophagitis: Secondary | ICD-10-CM

## 2014-09-14 DIAGNOSIS — I10 Essential (primary) hypertension: Secondary | ICD-10-CM

## 2014-09-14 DIAGNOSIS — E1129 Type 2 diabetes mellitus with other diabetic kidney complication: Secondary | ICD-10-CM

## 2014-09-14 DIAGNOSIS — G629 Polyneuropathy, unspecified: Secondary | ICD-10-CM

## 2014-09-14 MED ORDER — CITALOPRAM HYDROBROMIDE 10 MG PO TABS: 10.0000 mg | ORAL_TABLET | Freq: Every day | ORAL | Status: DC

## 2014-09-14 MED ORDER — GABAPENTIN 300 MG PO CAPS: ORAL_CAPSULE | ORAL | Status: DC

## 2014-09-14 MED ORDER — GABAPENTIN 800 MG PO TABS: 800.0000 mg | ORAL_TABLET | Freq: Three times a day (TID) | ORAL | Status: DC

## 2014-09-14 MED ORDER — DIAZEPAM 5 MG PO TABS: ORAL_TABLET | ORAL | Status: DC

## 2014-09-14 MED ORDER — HYDROCODONE-ACETAMINOPHEN 5-325 MG PO TABS: 1.0000 | ORAL_TABLET | Freq: Four times a day (QID) | ORAL | Status: DC | PRN

## 2014-09-14 NOTE — Progress Notes (Signed)
Pre visit review using our clinic review tool, if applicable. No additional management support is needed unless otherwise documented below in the visit note. 

## 2014-09-14 NOTE — Patient Instructions (Addendum)
Ginger helps nausea, try tea, capsules, ale, cookies, candied    Basic Carbohydrate Counting for Diabetes Mellitus Carbohydrate counting is a method for keeping track of the amount of carbohydrates you eat. Eating carbohydrates naturally increases the level of sugar (glucose) in your blood, so it is important for you to know the amount that is okay for you to have in every meal. Carbohydrate counting helps keep the level of glucose in your blood within normal limits. The amount of carbohydrates allowed is different for every person. A dietitian can help you calculate the amount that is right for you. Once you know the amount of carbohydrates you can have, you can count the carbohydrates in the foods you want to eat. Carbohydrates are found in the following foods:  Grains, such as breads and cereals.  Dried beans and soy products.  Starchy vegetables, such as potatoes, peas, and corn.  Fruit and fruit juices.  Milk and yogurt.  Sweets and snack foods, such as cake, cookies, candy, chips, soft drinks, and fruit drinks. CARBOHYDRATE COUNTING There are two ways to count the carbohydrates in your food. You can use either of the methods or a combination of both. Reading the "Nutrition Facts" on [place] The "Nutrition Facts" is an area that is included on the labels of almost all packaged food and beverages in the [place]. It includes the serving size of that food or beverage and information about the nutrients in each serving of the food, including the grams (g) of carbohydrate per serving.  Decide the number of servings of this food or beverage that you will be able to eat or drink. Multiply that number of servings by the number of grams of carbohydrate that is listed on the label for that serving. The total will be the amount of carbohydrates you will be having when you eat or drink this food or beverage. Learning Standard Serving Sizes of Food When you eat food that is not packaged  or does not include "Nutrition Facts" on the label, you need to measure the servings in order to count the amount of carbohydrates.A serving of most carbohydrate-rich foods contains about 15 g of carbohydrates. The following list includes serving sizes of carbohydrate-rich foods that provide 15 g ofcarbohydrate per serving:   1 slice of bread (1 oz) or 1 six-inch tortilla.    of a hamburger bun or English muffin.  4-6 crackers.   cup unsweetened dry cereal.    cup hot cereal.   cup rice or pasta.    cup mashed potatoes or  of a large baked potato.  1 cup fresh fruit or one small piece of fruit.    cup canned or frozen fruit or fruit juice.  1 cup milk.   cup plain fat-free yogurt or yogurt sweetened with artificial sweeteners.   cup cooked dried beans or starchy vegetable, such as peas, corn, or potatoes.  Decide the number of standard-size servings that you will eat. Multiply that number of servings by 15 (the grams of carbohydrates in that serving). For example, if you eat 2 cups of strawberries, you will have eaten 2 servings and 30 g of carbohydrates (2 servings x 15 g = 30 g). For foods such as soups and casseroles, in which more than one food is mixed in, you will need to count the carbohydrates in each food that is included. EXAMPLE OF CARBOHYDRATE COUNTING Sample Dinner  3 oz chicken breast.   cup of brown rice.   cup  of corn.  1 cup milk.   1 cup strawberries with sugar-free whipped topping.  Carbohydrate Calculation Step 1: Identify the foods that contain carbohydrates:   Rice.   Corn.   Milk.   Strawberries. Step 2:Calculate the number of servings eaten of each:   2 servings of rice.   1 serving of corn.   1 serving of milk.   1 serving of strawberries. Step 3: Multiply each of those number of servings by 15 g:   2 servings of rice x 15 g = 30 g.   1 serving of corn x 15 g = 15 g.   1 serving of milk x 15 g = 15  g.   1 serving of strawberries x 15 g = 15 g. Step 4: Add together all of the amounts to find the total grams of carbohydrates eaten: 30 g + 15 g + 15 g + 15 g = 75 g. Document Released: 12/17/2005 Document Revised: 05/03/2014 Document Reviewed: 11/13/2013 [facility] Patient Information 2015 [place], [place]. This information is not intended to replace advice given to you by your health care provider. Make sure you discuss any questions you have with your health care provider.

## 2014-09-14 NOTE — Telephone Encounter (Signed)
Dana Burns at pharmacy informed to discontinue the Gabapentin 300 mg RX. MD sent in error

## 2014-09-19 ENCOUNTER — Encounter: Payer: Self-pay | Admitting: Family Medicine

## 2014-09-19 NOTE — Assessment & Plan Note (Signed)
Well controlled, no changes to meds. Encouraged heart healthy diet such as the DASH diet and exercise as tolerated.  °

## 2014-09-19 NOTE — Progress Notes (Signed)
Patient ID: Dana Burns, female   DOB: 04-05-1926, 80 y.o.   MRN: [id number] Dana Burns [id number] February 07, 1926 09/19/2014      Progress Note-Follow Up  Subjective  Chief Complaint  Chief Complaint  Patient presents with  . Follow-up    3 month  . Injections    prevnar    HPI  Patient is a 80 year old female in today for routine medical care. In today noting increased fatigue and distress. Is noting some shortness of breath with exertion. Blood sugars have been ranging 105-140. Continues to follow with ophthalmology Dr. Ammie Ferrier office. Denies CP/palp/SOB/HA/congestion/fevers/GI or GU c/o. Taking meds as prescribed  Past Medical History  Diagnosis Date  . Diabetes mellitus     6 years  . Peripheral neuropathy   . Fibromyalgia   . Colon cancer   . Complication of anesthesia     agitated when waking up, wakes up shaking, "like  I'm going into shock"  . Hypertension     15 years, reports she has a rapid heartrate   . Depression     husband died  3 months ago, pt. admits depression  currently   . Shortness of breath   . GERD (gastroesophageal reflux disease)   . Headache(784.0)     occasional - "bad headaches"  . Osteoarthritis     in hands & all over, feet  . H/O echocardiogram     done /w [place] in 2011, saw Dr. Percival Spanish for tachycardia   . Hyperlipidemia     6 years  . Chicken pox as a child  . Measles as a child  . Allergy     sneeze, rhinorrhea in am  . Pain of right shoulder region 07/07/2014    Past Surgical History  Procedure Laterality Date  . Cataract extraction      /w IOL- bilateral   . Colon surgery      For resection of cancer  . Nodule removed      From throat  . Breast surgery  2013    right- benign    Family History  Problem Relation Age of Onset  . Cancer Mother     Brain  . Cancer Father     Colorectal  . Heart disease Neg Hx     Early  . Cancer Sister     breast  . Other Brother     pacemaker  . Arthritis Sister   . Emphysema  Brother   . Cancer Brother   . COPD Brother   . Neuropathy Daughter   . Fibromyalgia Daughter     History   Social History  . Marital Status: Widowed    Spouse Name: N/A    Number of Children: 1  . Years of Education: 9   Occupational History  . Retired    Social History Main Topics  . Smoking status: Never Smoker   . Smokeless tobacco: Never Used  . Alcohol Use: No  . Drug Use: No  . Sexual Activity: No     Comment: lives with daughter. no dietary restrictions   Other Topics Concern  . Not on file   Social History Narrative   Patient lives at home with daughter.    Patient is retired.    Patient is widowed.    Patient has 1 child.    Patient has a 9th grade education.     Current Outpatient Prescriptions on File Prior to Visit  Medication Sig Dispense Refill  .  calcium carbonate (OS-CAL) 600 MG TABS Take 600 mg by mouth 2 (two) times daily with a meal.        . Cholecalciferol (VITAMIN D3) 5000 UNITS CAPS Take 5,000 Units by mouth daily.      . cyanocobalamin (,VITAMIN B-12,) 1000 MCG/ML injection Inject one ML IM daily for a week then weekly for 4 weeks then monthly  10 mL  3  . Cyanocobalamin (VITAMIN B-12) 1000 MCG SUBL Place 1 tablet (1,000 mcg total) under the tongue daily.  30 tablet  3  . metFORMIN (GLUCOPHAGE) 500 MG tablet Take 1 tablet (500 mg total) by mouth daily before breakfast.  30 tablet  3  . Omega-3 Fatty Acids (FISH OIL) 1200 MG CAPS Take 1,200 mg by mouth 2 (two) times daily.        Marland Kitchen omeprazole (PRILOSEC) 20 MG capsule TWICE A DAY  60 capsule  4  . pravastatin (PRAVACHOL) 40 MG tablet TAKE 1 TABLET BY MOUTH AT BEDTIME  30 tablet  4  . valsartan-hydrochlorothiazide (DIOVAN-HCT) 160-12.5 MG per tablet Take 1 tablet by mouth daily.  30 tablet  3   No current facility-administered medications on file prior to visit.    Allergies  Allergen Reactions  . Tape Itching and Rash  . Actos [Pioglitazone Hydrochloride]   . Buspar [Buspirone Hcl]   .  Lipitor [Atorvastatin Calcium]   . Penicillins Rash    Review of Systems  Review of Systems  Constitutional: Positive for malaise/fatigue. Negative for fever.  HENT: Negative for congestion.   Eyes: Negative for discharge.  Respiratory: Positive for sputum production. Negative for shortness of breath.   Cardiovascular: Negative for chest pain, palpitations and leg swelling.  Gastrointestinal: Negative for nausea, abdominal pain and diarrhea.  Genitourinary: Negative for dysuria.  Musculoskeletal: Positive for joint pain. Negative for falls.  Skin: Negative for rash.  Neurological: Negative for loss of consciousness and headaches.  Endo/Heme/Allergies: Negative for polydipsia.  Psychiatric/Behavioral: Positive for depression. Negative for suicidal ideas. The patient is nervous/anxious. The patient does not have insomnia.     Objective  BP 130/76  Pulse 89  Temp(Src) 98.5 F (36.9 C) (Oral)  Ht 5\' 6"  (1.676 m)  Wt 139 lb 3.2 oz (63.141 kg)  BMI 22.48 kg/m2  SpO2 98%  Physical Exam  Physical Exam  Constitutional: She is oriented to person, place, and time and well-developed, well-nourished, and in no distress. No distress.  HENT:  Head: Normocephalic and atraumatic.  Eyes: Conjunctivae are normal.  Neck: Neck supple. No thyromegaly present.  Cardiovascular: Normal rate, regular rhythm and normal heart sounds.   No murmur heard. Pulmonary/Chest: Effort normal and breath sounds normal. She has no wheezes.  Abdominal: She exhibits no distension and no mass.  Musculoskeletal: She exhibits no edema.  Lymphadenopathy:    She has no cervical adenopathy.  Neurological: She is alert and oriented to person, place, and time.  Skin: Skin is warm and dry. No rash noted. She is not diaphoretic.  Psychiatric: Memory, affect and judgment normal.    Lab Results  Component Value Date   TSH 1.57 09/07/2014   Lab Results  Component Value Date   WBC 4.9 09/07/2014   HGB 11.6* 09/07/2014    HCT 35.7* 09/07/2014   MCV 81.0 09/07/2014   PLT 239.0 09/07/2014   Lab Results  Component Value Date   CREATININE 1.1 09/07/2014   BUN 20 09/07/2014   NA 141 09/07/2014   K 5.0 09/07/2014   CL 105  09/07/2014   CO2 31 09/07/2014   Lab Results  Component Value Date   ALT 11 09/07/2014   AST 17 09/07/2014   ALKPHOS 54 09/07/2014   BILITOT 0.4 09/07/2014   Lab Results  Component Value Date   CHOL 174 09/07/2014   Lab Results  Component Value Date   HDL 43.20 09/07/2014   Lab Results  Component Value Date   LDLCALC 94 09/07/2014   Lab Results  Component Value Date   TRIG 186.0* 09/07/2014   Lab Results  Component Value Date   CHOLHDL 4 09/07/2014     Assessment & Plan  Essential hypertension, benign Well controlled, no changes to meds. Encouraged heart healthy diet such as the DASH diet and exercise as tolerated.   GERD (gastroesophageal reflux disease) Avoid offending foods, start probiotics. Do not eat large meals in late evening and consider raising head of bed.   Diabetes mellitus hgba1c acceptable, minimize simple carbs. Increase exercise as tolerated. Continue current meds  Hyperlipidemia Tolerating statin, encouraged heart healthy diet, avoid trans fats, minimize simple carbs and saturated fats. Increase exercise as tolerated

## 2014-09-19 NOTE — Assessment & Plan Note (Signed)
hgba1c acceptable, minimize simple carbs. Increase exercise as tolerated. Continue current meds 

## 2014-09-19 NOTE — Assessment & Plan Note (Signed)
Avoid offending foods, start probiotics. Do not eat large meals in late evening and consider raising head of bed.  

## 2014-09-19 NOTE — Assessment & Plan Note (Signed)
Tolerating statin, encouraged heart healthy diet, avoid trans fats, minimize simple carbs and saturated fats. Increase exercise as tolerated 

## 2014-09-20 ENCOUNTER — Other Ambulatory Visit: Payer: Self-pay | Admitting: *Deleted

## 2014-09-20 MED ORDER — OMEPRAZOLE 20 MG PO CPDR: DELAYED_RELEASE_CAPSULE | ORAL | Status: DC

## 2014-09-28 ENCOUNTER — Ambulatory Visit (INDEPENDENT_AMBULATORY_CARE_PROVIDER_SITE_OTHER): Payer: Medicare Other

## 2014-09-28 ENCOUNTER — Telehealth: Payer: Self-pay | Admitting: *Deleted

## 2014-09-28 DIAGNOSIS — I10 Essential (primary) hypertension: Secondary | ICD-10-CM

## 2014-09-28 DIAGNOSIS — E785 Hyperlipidemia, unspecified: Secondary | ICD-10-CM

## 2014-09-28 DIAGNOSIS — E119 Type 2 diabetes mellitus without complications: Secondary | ICD-10-CM

## 2014-09-28 DIAGNOSIS — E109 Type 1 diabetes mellitus without complications: Secondary | ICD-10-CM

## 2014-09-28 DIAGNOSIS — M8580 Other specified disorders of bone density and structure, unspecified site: Secondary | ICD-10-CM

## 2014-09-28 DIAGNOSIS — E538 Deficiency of other specified B group vitamins: Secondary | ICD-10-CM

## 2014-09-28 MED ORDER — CYANOCOBALAMIN 1000 MCG/ML IJ SOLN
1000.0000 ug | Freq: Once | INTRAMUSCULAR | Status: AC
  Administered 2014-09-28: 1000 ug via INTRAMUSCULAR

## 2014-09-28 NOTE — Telephone Encounter (Signed)
She is welcome to come in November if she thinks she needs me but for routine follow up she would be better off to return for visit and labs after 12/8

## 2014-09-28 NOTE — Telephone Encounter (Signed)
Pt states she is due to come back in December for a 3 month appointment with Charlett Blake, and labs.  I did not see that stated in her chart.  She has an 8 week follow up scheduled 11/09/2014.  Please advise if she needs labs and a 3 month follow up in December.

## 2014-09-28 NOTE — Progress Notes (Signed)
Pt tolerated injection well

## 2014-09-28 NOTE — Progress Notes (Signed)
Pre visit review using our clinic review tool, if applicable. No additional management support is needed unless otherwise documented below in the visit note. 

## 2014-09-28 NOTE — Telephone Encounter (Signed)
I don't see anything either? It looks like the 3 month is the 11-09-14 not an 8 week follow up?  Please advise? And if labs need to be ordered, please order?

## 2014-09-29 NOTE — Telephone Encounter (Signed)
Phone busy, will try again later.

## 2014-09-30 NOTE — Telephone Encounter (Signed)
Needs Vitamin B12, Vitamin D, lipid, renal, cbc, tsh, hepatic and hgba1c for DM, HTN, vit B12 def and osteopenia

## 2014-09-30 NOTE — Telephone Encounter (Signed)
Please advise and order labs for 12-09-14

## 2014-09-30 NOTE — Telephone Encounter (Signed)
Left message for patient to return my call.

## 2014-09-30 NOTE — Telephone Encounter (Signed)
Patient called back and scheduled lab appointment for 12/09/14. Please enter orders. She scheduled appointment for dr blyth as well and she wants to keep appointment for November.

## 2014-10-01 NOTE — Telephone Encounter (Signed)
Labs ordered.

## 2014-10-28 ENCOUNTER — Ambulatory Visit (INDEPENDENT_AMBULATORY_CARE_PROVIDER_SITE_OTHER): Payer: Medicare Other

## 2014-10-28 DIAGNOSIS — E538 Deficiency of other specified B group vitamins: Secondary | ICD-10-CM

## 2014-10-28 MED ORDER — CYANOCOBALAMIN 1000 MCG/ML IJ SOLN
1000.0000 ug | Freq: Once | INTRAMUSCULAR | Status: AC
Start: 2014-10-28 — End: 2014-10-28
  Administered 2014-10-28: 1000 ug via INTRAMUSCULAR

## 2014-10-28 NOTE — Progress Notes (Signed)
Patient ID: Dana Burns, female   DOB: 04/09/1926, 80 y.o.   MRN: [id number] Patient given B12 injection. Tolerated well, no complications at discharge.

## 2014-11-09 ENCOUNTER — Ambulatory Visit (INDEPENDENT_AMBULATORY_CARE_PROVIDER_SITE_OTHER): Payer: Medicare Other | Admitting: Family Medicine

## 2014-11-09 ENCOUNTER — Encounter: Payer: Self-pay | Admitting: Family Medicine

## 2014-11-09 VITALS — BP 134/70 | HR 67 | Temp 98.2°F | Ht 66.0 in | Wt 138.6 lb

## 2014-11-09 DIAGNOSIS — E785 Hyperlipidemia, unspecified: Secondary | ICD-10-CM

## 2014-11-09 DIAGNOSIS — E109 Type 1 diabetes mellitus without complications: Secondary | ICD-10-CM

## 2014-11-09 DIAGNOSIS — I10 Essential (primary) hypertension: Secondary | ICD-10-CM

## 2014-11-09 DIAGNOSIS — K219 Gastro-esophageal reflux disease without esophagitis: Secondary | ICD-10-CM

## 2014-11-09 DIAGNOSIS — R000 Tachycardia, unspecified: Secondary | ICD-10-CM

## 2014-11-09 DIAGNOSIS — M1990 Unspecified osteoarthritis, unspecified site: Secondary | ICD-10-CM

## 2014-11-09 NOTE — Progress Notes (Signed)
Pre visit review using our clinic review tool, if applicable. No additional management support is needed unless otherwise documented below in the visit note. 

## 2014-11-09 NOTE — Progress Notes (Signed)
Patient ID: Dana Burns, female   DOB: 1926/10/29, 80 y.o.   MRN: [id number] DANYELLE BROOKOVER [id number] Nov 03, 1926 11/09/2014      Progress Note-Follow Up  Subjective  Chief Complaint  Chief Complaint  Patient presents with  . Follow-up    8 week  . Fall    on Friday hurt buttocks and right foot and right ankle- swells    HPI  Patient is a 80 year old female in today for routine medical care. [place] last Friday, tripped over something at home, no syncope. Fell on her sacrum and twisted her right ankle and has had some pain and swelling at ankle and hip, right. Is feeling gradually better. Is able to bear weight. Minimize swelling in right ankle which is not dissimilar from her baseline. Otherwise feeling well. No syncope, or headache, no other neurologic complaints. Denies CP/palp/SOB/HA/congestion/fevers/GI or GU c/o. Taking meds as prescribed  Past Medical History  Diagnosis Date  . Diabetes mellitus     6 years  . Peripheral neuropathy   . Fibromyalgia   . Colon cancer   . Complication of anesthesia     agitated when waking up, wakes up shaking, "like  I'm going into shock"  . Hypertension     15 years, reports she has a rapid heartrate   . Depression     husband died  3 months ago, pt. admits depression  currently   . Shortness of breath   . GERD (gastroesophageal reflux disease)   . Headache(784.0)     occasional - "bad headaches"  . Osteoarthritis     in hands & all over, feet  . H/O echocardiogram     done /w [place] in 2011, saw Dr. Percival Spanish for tachycardia   . Hyperlipidemia     6 years  . Chicken pox as a child  . Measles as a child  . Allergy     sneeze, rhinorrhea in am  . Pain of right shoulder region 07/07/2014    Past Surgical History  Procedure Laterality Date  . Cataract extraction      /w IOL- bilateral   . Colon surgery      For resection of cancer  . Nodule removed      From throat  . Breast surgery  2013    right- benign    Family History   Problem Relation Age of Onset  . Cancer Mother     Brain  . Cancer Father     Colorectal  . Heart disease Neg Hx     Early  . Cancer Sister     breast  . Other Brother     pacemaker  . Arthritis Sister   . Emphysema Brother   . Cancer Brother   . COPD Brother   . Neuropathy Daughter   . Fibromyalgia Daughter     History   Social History  . Marital Status: Widowed    Spouse Name: N/A    Number of Children: 1  . Years of Education: 9   Occupational History  . Retired    Social History Main Topics  . Smoking status: Never Smoker   . Smokeless tobacco: Never Used  . Alcohol Use: No  . Drug Use: No  . Sexual Activity: No     Comment: lives with daughter. no dietary restrictions   Other Topics Concern  . Not on file   Social History Narrative   Patient lives at home with daughter.  Patient is retired.    Patient is widowed.    Patient has 1 child.    Patient has a 9th grade education.     Current Outpatient Prescriptions on File Prior to Visit  Medication Sig Dispense Refill  . calcium carbonate (OS-CAL) 600 MG TABS Take 600 mg by mouth 2 (two) times daily with a meal.      . Cholecalciferol (VITAMIN D3) 5000 UNITS CAPS Take 5,000 Units by mouth daily.    . citalopram (CELEXA) 10 MG tablet Take 1 tablet (10 mg total) by mouth daily. 30 tablet 2  . Cyanocobalamin (VITAMIN B-12) 1000 MCG SUBL Place 1 tablet (1,000 mcg total) under the tongue daily. 30 tablet 3  . diazepam (VALIUM) 5 MG tablet one and a half po bid prn 45 tablet 3  . gabapentin (NEURONTIN) 800 MG tablet Take 1 tablet (800 mg total) by mouth 3 (three) times daily. 90 tablet 3  . HYDROcodone-acetaminophen (NORCO/VICODIN) 5-325 MG per tablet Take 1 tablet by mouth every 6 (six) hours as needed for moderate pain. 40 tablet 0  . metFORMIN (GLUCOPHAGE) 500 MG tablet Take 1 tablet (500 mg total) by mouth daily before breakfast. 30 tablet 3  . Omega-3 Fatty Acids (FISH OIL) 1200 MG CAPS Take 1,200 mg by  mouth 2 (two) times daily.      Marland Kitchen omeprazole (PRILOSEC) 20 MG capsule Take one po bid 60 capsule 2  . pravastatin (PRAVACHOL) 40 MG tablet TAKE 1 TABLET BY MOUTH AT BEDTIME 30 tablet 4  . valsartan-hydrochlorothiazide (DIOVAN-HCT) 160-12.5 MG per tablet Take 1 tablet by mouth daily. 30 tablet 3   No current facility-administered medications on file prior to visit.    Allergies  Allergen Reactions  . Tape Itching and Rash  . Actos [Pioglitazone Hydrochloride]   . Buspar [Buspirone Hcl]   . Lipitor [Atorvastatin Calcium]   . Penicillins Rash    Review of Systems  Review of Systems  Constitutional: Negative for fever and malaise/fatigue.  HENT: Negative for congestion.   Eyes: Negative for discharge.  Respiratory: Negative for shortness of breath.   Cardiovascular: Negative for chest pain, palpitations and leg swelling.  Gastrointestinal: Negative for nausea, abdominal pain and diarrhea.  Genitourinary: Negative for dysuria.  Musculoskeletal: Positive for back pain, joint pain and falls.       [place] last Friday, tripped over something at home, no syncope. Fell on her sacrum and twisted her right ankle and has had some pain and swelling at ankle and hip, right. Is feeling gradually better. Is able to bear weight. Minimize swelling in right ankle which is not dissimilar from her baseline  Skin: Negative for rash.  Neurological: Negative for loss of consciousness and headaches.  Endo/Heme/Allergies: Negative for polydipsia.  Psychiatric/Behavioral: Negative for depression and suicidal ideas. The patient is not nervous/anxious and does not have insomnia.     Objective  BP 141/68 mmHg  Pulse 67  Temp(Src) 98.2 F (36.8 C) (Oral)  Ht 5\' 6"  (1.676 m)  Wt 138 lb 9.6 oz (62.869 kg)  BMI 22.38 kg/m2  SpO2 100%  Physical Exam  Physical Exam  Constitutional: She is oriented to person, place, and time and well-developed, well-nourished, and in no distress. No distress.  HENT:  Head:  Normocephalic and atraumatic.  Eyes: Conjunctivae are normal.  Neck: Neck supple. No thyromegaly present.  Cardiovascular: Normal rate, regular rhythm and normal heart sounds.   No murmur heard. Pulmonary/Chest: Effort normal and breath sounds normal. She has no  wheezes.  Abdominal: She exhibits no distension and no mass.  Musculoskeletal: She exhibits no edema.  Lymphadenopathy:    She has no cervical adenopathy.  Neurological: She is alert and oriented to person, place, and time.  Skin: Skin is warm and dry. No rash noted. She is not diaphoretic.  Psychiatric: Memory, affect and judgment normal.    Lab Results  Component Value Date   TSH 1.57 09/07/2014   Lab Results  Component Value Date   WBC 4.9 09/07/2014   HGB 11.6* 09/07/2014   HCT 35.7* 09/07/2014   MCV 81.0 09/07/2014   PLT 239.0 09/07/2014   Lab Results  Component Value Date   CREATININE 1.1 09/07/2014   BUN 20 09/07/2014   NA 141 09/07/2014   K 5.0 09/07/2014   CL 105 09/07/2014   CO2 31 09/07/2014   Lab Results  Component Value Date   ALT 11 09/07/2014   AST 17 09/07/2014   ALKPHOS 54 09/07/2014   BILITOT 0.4 09/07/2014   Lab Results  Component Value Date   CHOL 174 09/07/2014   Lab Results  Component Value Date   HDL 43.20 09/07/2014   Lab Results  Component Value Date   LDLCALC 94 09/07/2014   Lab Results  Component Value Date   TRIG 186.0* 09/07/2014   Lab Results  Component Value Date   CHOLHDL 4 09/07/2014     Assessment & Plan   Essential hypertension, benign Well controlled, no changes to meds. Encouraged heart healthy diet such as the DASH diet and exercise as tolerated.   GERD (gastroesophageal reflux disease) Avoid offending foods, start probiotics. Do not eat large meals in late evening and consider raising head of bed.   Diabetes mellitus hgba1c acceptable, minimize simple carbs. Increase exercise as tolerated. Continue current meds  Hyperlipidemia Tolerating  statin, encouraged heart healthy diet, avoid trans fats, minimize simple carbs and saturated fats. Increase exercise as tolerated  Tachycardia RRR  Osteoarthritis (arthritis due to wear and tear of joints) Struggling with arthritis in neck Encouraged moist heat and gentle stretching as tolerated. May try NSAIDs and prescription meds as directed and report if symptoms worsen or seek immediate care. Try [place] prn

## 2014-11-09 NOTE — Patient Instructions (Signed)
Needs Dec 17 appt with Dr B moved to January to be the same day as her vitamin B12 shot.   Try Salon Pas patches or gel especially after massage  Arthitis Arthritis is inflammation of a joint. This usually means pain, redness, warmth or swelling are present. One or more joints may be involved. There are a number of types of arthritis. Your caregiver may not be able to tell what type of arthritis you have right away. CAUSES  The most common cause of arthritis is the wear and tear on the joint (osteoarthritis). This causes damage to the cartilage, which can break down over time. The knees, hips, back and neck are most often affected by this type of arthritis. Other types of arthritis and common causes of joint pain include:  Sprains and other injuries near the joint. Sometimes minor sprains and injuries cause pain and swelling that develop hours later.  Rheumatoid arthritis. This affects hands, feet and knees. It usually affects both sides of your body at the same time. It is often associated with chronic ailments, fever, weight loss and general weakness.  Crystal arthritis. Gout and pseudo gout can cause occasional acute severe pain, redness and swelling in the foot, ankle, or knee.  Infectious arthritis. Bacteria can get into a joint through a break in overlying skin. This can cause infection of the joint. Bacteria and viruses can also spread through the blood and affect your joints.  Drug, infectious and allergy reactions. Sometimes joints can become mildly painful and slightly swollen with these types of illnesses. SYMPTOMS   Pain is the main symptom.  Your joint or joints can also be red, swollen and warm or hot to the touch.  You may have a fever with certain types of arthritis, or even feel overall ill.  The joint with arthritis will hurt with movement. Stiffness is present with some types of arthritis. DIAGNOSIS  Your caregiver will suspect arthritis based on your description of  your symptoms and on your exam. Testing may be needed to find the type of arthritis:  Blood and sometimes urine tests.  X-ray tests and sometimes CT or MRI scans.  Removal of fluid from the joint (arthrocentesis) is done to check for bacteria, crystals or other causes. Your caregiver (or a specialist) will numb the area over the joint with a local anesthetic, and use a needle to remove joint fluid for examination. This procedure is only minimally uncomfortable.  Even with these tests, your caregiver may not be able to tell what kind of arthritis you have. Consultation with a specialist (rheumatologist) may be helpful. TREATMENT  Your caregiver will discuss with you treatment specific to your type of arthritis. If the specific type cannot be determined, then the following general recommendations may apply. Treatment of severe joint pain includes:  Rest.  Elevation.  Anti-inflammatory medication (for example, ibuprofen) may be prescribed. Avoiding activities that cause increased pain.  Only take over-the-counter or prescription medicines for pain and discomfort as recommended by your caregiver.  Cold packs over an inflamed joint may be used for 10 to 15 minutes every hour. Hot packs sometimes feel better, but do not use overnight. Do not use hot packs if you are diabetic without your caregiver's permission.  A cortisone shot into arthritic joints may help reduce pain and swelling.  Any acute arthritis that gets worse over the next 1 to 2 days needs to be looked at to be sure there is no joint infection. Long-term arthritis treatment involves  modifying activities and lifestyle to reduce joint stress jarring. This can include weight loss. Also, exercise is needed to nourish the joint cartilage and remove waste. This helps keep the muscles around the joint strong. HOME CARE INSTRUCTIONS   Do not take aspirin to relieve pain if gout is suspected. This elevates uric acid levels.  Only take  over-the-counter or prescription medicines for pain, discomfort or fever as directed by your caregiver.  Rest the joint as much as possible.  If your joint is swollen, keep it elevated.  Use crutches if the painful joint is in your leg.  Drinking plenty of fluids may help for certain types of arthritis.  Follow your caregiver's dietary instructions.  Try low-impact exercise such as:  Swimming.  Water aerobics.  Biking.  Walking.  Morning stiffness is often relieved by a warm shower.  Put your joints through regular range-of-motion. SEEK MEDICAL CARE IF:   You do not feel better in 24 hours or are getting worse.  You have side effects to medications, or are not getting better with treatment. SEEK IMMEDIATE MEDICAL CARE IF:   You have a fever.  You develop severe joint pain, swelling or redness.  Many joints are involved and become painful and swollen.  There is severe back pain and/or leg weakness.  You have loss of bowel or bladder control. Document Released: 01/24/2005 Document Revised: 03/10/2012 Document Reviewed: 02/09/2009 [facility] Patient Information 2015 [place], [place]. This information is not intended to replace advice given to you by your health care provider. Make sure you discuss any questions you have with your health care provider.

## 2014-11-14 NOTE — Assessment & Plan Note (Signed)
Struggling with arthritis in neck Encouraged moist heat and gentle stretching as tolerated. May try NSAIDs and prescription meds as directed and report if symptoms worsen or seek immediate care. Try Salon Pas prn

## 2014-11-14 NOTE — Assessment & Plan Note (Signed)
hgba1c acceptable, minimize simple carbs. Increase exercise as tolerated. Continue current meds 

## 2014-11-14 NOTE — Assessment & Plan Note (Signed)
Avoid offending foods, start probiotics. Do not eat large meals in late evening and consider raising head of bed.  

## 2014-11-14 NOTE — Assessment & Plan Note (Signed)
Tolerating statin, encouraged heart healthy diet, avoid trans fats, minimize simple carbs and saturated fats. Increase exercise as tolerated 

## 2014-11-14 NOTE — Assessment & Plan Note (Signed)
Well controlled, no changes to meds. Encouraged heart healthy diet such as the DASH diet and exercise as tolerated.  °

## 2014-11-14 NOTE — Assessment & Plan Note (Signed)
RRR 

## 2014-11-15 ENCOUNTER — Other Ambulatory Visit: Payer: Self-pay | Admitting: Family Medicine

## 2014-11-30 ENCOUNTER — Ambulatory Visit (INDEPENDENT_AMBULATORY_CARE_PROVIDER_SITE_OTHER): Payer: Medicare Other

## 2014-11-30 DIAGNOSIS — E538 Deficiency of other specified B group vitamins: Secondary | ICD-10-CM

## 2014-11-30 MED ORDER — CYANOCOBALAMIN 1000 MCG/ML IJ SOLN
1000.0000 ug | Freq: Once | INTRAMUSCULAR | Status: AC
  Administered 2014-11-30: 1000 ug via INTRAMUSCULAR

## 2014-11-30 NOTE — Progress Notes (Signed)
Pre visit review using our clinic review tool, if applicable. No additional management support is needed unless otherwise documented below in the visit note. 

## 2014-12-05 ENCOUNTER — Other Ambulatory Visit: Payer: Self-pay | Admitting: Family Medicine

## 2014-12-06 ENCOUNTER — Other Ambulatory Visit: Payer: Self-pay | Admitting: Family Medicine

## 2014-12-09 ENCOUNTER — Other Ambulatory Visit: Payer: Medicare Other

## 2014-12-09 ENCOUNTER — Ambulatory Visit: Payer: Medicare Other | Admitting: Family Medicine

## 2014-12-16 ENCOUNTER — Ambulatory Visit: Payer: Medicare Other | Admitting: Family Medicine

## 2015-01-01 ENCOUNTER — Other Ambulatory Visit: Payer: Self-pay | Admitting: Family Medicine

## 2015-01-05 DIAGNOSIS — E119 Type 2 diabetes mellitus without complications: Secondary | ICD-10-CM | POA: Diagnosis not present

## 2015-01-11 ENCOUNTER — Other Ambulatory Visit: Payer: Self-pay | Admitting: Family Medicine

## 2015-01-11 NOTE — Telephone Encounter (Signed)
Rx sent to the pharmacy by e-script.//AB/CMA 

## 2015-01-17 ENCOUNTER — Other Ambulatory Visit: Payer: Self-pay | Admitting: Family Medicine

## 2015-01-18 ENCOUNTER — Encounter: Payer: Self-pay | Admitting: Family Medicine

## 2015-01-18 ENCOUNTER — Ambulatory Visit (INDEPENDENT_AMBULATORY_CARE_PROVIDER_SITE_OTHER): Payer: Medicare Other | Admitting: Family Medicine

## 2015-01-18 VITALS — BP 140/78 | HR 74 | Temp 97.8°F | Ht 66.0 in | Wt 141.2 lb

## 2015-01-18 DIAGNOSIS — G629 Polyneuropathy, unspecified: Secondary | ICD-10-CM

## 2015-01-18 DIAGNOSIS — E119 Type 2 diabetes mellitus without complications: Secondary | ICD-10-CM | POA: Diagnosis not present

## 2015-01-18 DIAGNOSIS — E538 Deficiency of other specified B group vitamins: Secondary | ICD-10-CM | POA: Diagnosis not present

## 2015-01-18 DIAGNOSIS — E782 Mixed hyperlipidemia: Secondary | ICD-10-CM | POA: Diagnosis not present

## 2015-01-18 DIAGNOSIS — E1142 Type 2 diabetes mellitus with diabetic polyneuropathy: Secondary | ICD-10-CM

## 2015-01-18 DIAGNOSIS — G894 Chronic pain syndrome: Secondary | ICD-10-CM

## 2015-01-18 DIAGNOSIS — E785 Hyperlipidemia, unspecified: Secondary | ICD-10-CM

## 2015-01-18 DIAGNOSIS — I10 Essential (primary) hypertension: Secondary | ICD-10-CM | POA: Diagnosis not present

## 2015-01-18 DIAGNOSIS — E1149 Type 2 diabetes mellitus with other diabetic neurological complication: Secondary | ICD-10-CM

## 2015-01-18 DIAGNOSIS — E1140 Type 2 diabetes mellitus with diabetic neuropathy, unspecified: Secondary | ICD-10-CM

## 2015-01-18 MED ORDER — VALSARTAN-HYDROCHLOROTHIAZIDE 160-12.5 MG PO TABS: 1.0000 | ORAL_TABLET | Freq: Every day | ORAL | Status: DC

## 2015-01-18 MED ORDER — PRAVASTATIN SODIUM 40 MG PO TABS: 40.0000 mg | ORAL_TABLET | Freq: Every day | ORAL | Status: DC

## 2015-01-18 MED ORDER — METFORMIN HCL 500 MG PO TABS: 500.0000 mg | ORAL_TABLET | Freq: Every day | ORAL | Status: DC

## 2015-01-18 MED ORDER — CYANOCOBALAMIN 1000 MCG/ML IJ SOLN
1000.0000 ug | Freq: Once | INTRAMUSCULAR | Status: AC
  Administered 2015-01-18: 1000 ug via INTRAMUSCULAR

## 2015-01-18 MED ORDER — GABAPENTIN 800 MG PO TABS: 800.0000 mg | ORAL_TABLET | Freq: Three times a day (TID) | ORAL | Status: DC

## 2015-01-18 MED ORDER — HYDROCODONE-ACETAMINOPHEN 5-325 MG PO TABS: 1.0000 | ORAL_TABLET | Freq: Four times a day (QID) | ORAL | Status: DC | PRN

## 2015-01-18 MED ORDER — CITALOPRAM HYDROBROMIDE 10 MG PO TABS: ORAL_TABLET | ORAL | Status: DC

## 2015-01-18 MED ORDER — OMEPRAZOLE 20 MG PO CPDR: DELAYED_RELEASE_CAPSULE | ORAL | Status: DC

## 2015-01-18 NOTE — Patient Instructions (Addendum)
Needs monthly Vitamin B12 shots 1000 mcg  Basic Carbohydrate Counting for Diabetes Mellitus Carbohydrate counting is a method for keeping track of the amount of carbohydrates you eat. Eating carbohydrates naturally increases the level of sugar (glucose) in your blood, so it is important for you to know the amount that is okay for you to have in every meal. Carbohydrate counting helps keep the level of glucose in your blood within normal limits. The amount of carbohydrates allowed is different for every person. A dietitian can help you calculate the amount that is right for you. Once you know the amount of carbohydrates you can have, you can count the carbohydrates in the foods you want to eat. Carbohydrates are found in the following foods:  Grains, such as breads and cereals.  Dried beans and soy products.  Starchy vegetables, such as potatoes, peas, and corn.  Fruit and fruit juices.  Milk and yogurt.  Sweets and snack foods, such as cake, cookies, candy, chips, soft drinks, and fruit drinks. CARBOHYDRATE COUNTING There are two ways to count the carbohydrates in your food. You can use either of the methods or a combination of both. Reading the "Nutrition Facts" on [place] The "Nutrition Facts" is an area that is included on the labels of almost all packaged food and beverages in the [place]. It includes the serving size of that food or beverage and information about the nutrients in each serving of the food, including the grams (g) of carbohydrate per serving.  Decide the number of servings of this food or beverage that you will be able to eat or drink. Multiply that number of servings by the number of grams of carbohydrate that is listed on the label for that serving. The total will be the amount of carbohydrates you will be having when you eat or drink this food or beverage. Learning Standard Serving Sizes of Food When you eat food that is not packaged or does not include  "Nutrition Facts" on the label, you need to measure the servings in order to count the amount of carbohydrates.A serving of most carbohydrate-rich foods contains about 15 g of carbohydrates. The following list includes serving sizes of carbohydrate-rich foods that provide 15 g ofcarbohydrate per serving:   1 slice of bread (1 oz) or 1 six-inch tortilla.    of a hamburger bun or English muffin.  4-6 crackers.   cup unsweetened dry cereal.    cup hot cereal.   cup rice or pasta.    cup mashed potatoes or  of a large baked potato.  1 cup fresh fruit or one small piece of fruit.    cup canned or frozen fruit or fruit juice.  1 cup milk.   cup plain fat-free yogurt or yogurt sweetened with artificial sweeteners.   cup cooked dried beans or starchy vegetable, such as peas, corn, or potatoes.  Decide the number of standard-size servings that you will eat. Multiply that number of servings by 15 (the grams of carbohydrates in that serving). For example, if you eat 2 cups of strawberries, you will have eaten 2 servings and 30 g of carbohydrates (2 servings x 15 g = 30 g). For foods such as soups and casseroles, in which more than one food is mixed in, you will need to count the carbohydrates in each food that is included. EXAMPLE OF CARBOHYDRATE COUNTING Sample Dinner  3 oz chicken breast.   cup of brown rice.   cup of corn.  1  cup milk.   1 cup strawberries with sugar-free whipped topping.  Carbohydrate Calculation Step 1: Identify the foods that contain carbohydrates:   Rice.   Corn.   Milk.   Strawberries. Step 2:Calculate the number of servings eaten of each:   2 servings of rice.   1 serving of corn.   1 serving of milk.   1 serving of strawberries. Step 3: Multiply each of those number of servings by 15 g:   2 servings of rice x 15 g = 30 g.   1 serving of corn x 15 g = 15 g.   1 serving of milk x 15 g = 15 g.   1 serving of  strawberries x 15 g = 15 g. Step 4: Add together all of the amounts to find the total grams of carbohydrates eaten: 30 g + 15 g + 15 g + 15 g = 75 g. Document Released: 12/17/2005 Document Revised: 05/03/2014 Document Reviewed: 11/13/2013 [facility] Patient Information 2015 [place], [place]. This information is not intended to replace advice given to you by your health care provider. Make sure you discuss any questions you have with your health care provider.

## 2015-01-18 NOTE — Progress Notes (Signed)
Pre visit review using our clinic review tool, if applicable. No additional management support is needed unless otherwise documented below in the visit note. 

## 2015-01-19 ENCOUNTER — Other Ambulatory Visit: Payer: Self-pay | Admitting: Family Medicine

## 2015-01-19 LAB — COMPREHENSIVE METABOLIC PANEL
ALBUMIN: 4.2 g/dL (ref 3.5–5.2)
ALT: 9 U/L (ref 0–35)
AST: 17 U/L (ref 0–37)
Alkaline Phosphatase: 63 U/L (ref 39–117)
BUN: 19 mg/dL (ref 6–23)
CO2: 32 mEq/L (ref 19–32)
Calcium: 9.9 mg/dL (ref 8.4–10.5)
Chloride: 101 mEq/L (ref 96–112)
Creatinine, Ser: 1.17 mg/dL (ref 0.40–1.20)
GFR: 46.53 mL/min — ABNORMAL LOW (ref >60.00)
GLUCOSE: 89 mg/dL (ref 70–99)
Potassium: 4.2 mEq/L (ref 3.5–5.1)
SODIUM: 139 meq/L (ref 135–145)
TOTAL PROTEIN: 7.3 g/dL (ref 6.0–8.3)
Total Bilirubin: 0.3 mg/dL (ref 0.2–1.2)

## 2015-01-19 LAB — CBC
HCT: 35 % — ABNORMAL LOW (ref 36.0–46.0)
HEMOGLOBIN: 11.4 g/dL — AB (ref 12.0–15.0)
MCHC: 32.6 g/dL (ref 30.0–36.0)
MCV: 79.8 fl (ref 78.0–100.0)
PLATELETS: 281 10*3/uL (ref 150.0–400.0)
RBC: 4.38 Mil/uL (ref 3.87–5.11)
RDW: 17.2 % — ABNORMAL HIGH (ref 11.5–15.5)
WBC: 5.7 10*3/uL (ref 4.0–10.5)

## 2015-01-19 LAB — LIPID PANEL
CHOL/HDL RATIO: 3
Cholesterol: 170 mg/dL (ref 0–200)
HDL: 50.3 mg/dL (ref >39.00)
LDL Cholesterol: 96 mg/dL (ref 0–99)
NONHDL: 119.7
TRIGLYCERIDES: 117 mg/dL (ref 0.0–149.0)
VLDL: 23.4 mg/dL (ref 0.0–40.0)

## 2015-01-19 LAB — HEMOGLOBIN A1C: Hgb A1c MFr Bld: 6.8 % — ABNORMAL HIGH (ref 4.6–6.5)

## 2015-01-19 LAB — TSH: TSH: 2.74 u[IU]/mL (ref 0.35–4.50)

## 2015-01-19 NOTE — Telephone Encounter (Signed)
Rx request for sublingual B12.  Verified with patient that she is now taking B12 injections.  She stated that she did not want this medication refilled.  Rx refused.  eal

## 2015-01-20 ENCOUNTER — Telehealth: Payer: Self-pay | Admitting: *Deleted

## 2015-01-20 ENCOUNTER — Other Ambulatory Visit: Payer: Self-pay | Admitting: Family Medicine

## 2015-01-20 ENCOUNTER — Telehealth: Payer: Self-pay | Admitting: Family Medicine

## 2015-01-20 MED ORDER — GABAPENTIN 800 MG PO TABS: 800.0000 mg | ORAL_TABLET | Freq: Three times a day (TID) | ORAL | Status: DC

## 2015-01-20 NOTE — Telephone Encounter (Signed)
Caller name: [name] Pegram Relation to TT:[id number] Call back number:[phone] Pharmacy:[facility]  Reason for call: pt is needing rx gabapentin (NEURONTIN) 800 MG tablet, pt states she has been trying to get her meds since Monday, however she found out that her pharmacy sent the request to the wrong doctor. Pt states she is out and needs to get the rx before tomorrow.

## 2015-01-20 NOTE — Telephone Encounter (Signed)
Called and spoke with the pt and informed her of her lab results and note below.  Pt verbalized understanding.//AB/CMA

## 2015-01-20 NOTE — Telephone Encounter (Signed)
-----   Message from Mosie Lukes, MD sent at 01/19/2015 11:48 PM EST ----- Notify recent labs look good. A1C stable

## 2015-01-20 NOTE — Telephone Encounter (Signed)
Rx printed,signed and faxed to the pharmacy,.//AB/CMA

## 2015-01-23 ENCOUNTER — Encounter: Payer: Self-pay | Admitting: Family Medicine

## 2015-01-23 DIAGNOSIS — G894 Chronic pain syndrome: Secondary | ICD-10-CM

## 2015-01-23 HISTORY — DX: Chronic pain syndrome: G89.4

## 2015-01-23 NOTE — Assessment & Plan Note (Signed)
hgba1c acceptable, minimize simple carbs. Increase exercise as tolerated. Continue current meds 

## 2015-01-23 NOTE — Assessment & Plan Note (Signed)
Tolerating statin, encouraged heart healthy diet, avoid trans fats, minimize simple carbs and saturated fats. Increase exercise as tolerated 

## 2015-01-23 NOTE — Assessment & Plan Note (Signed)
Well controlled, no changes to meds. Encouraged heart healthy diet such as the DASH diet and exercise as tolerated.  °

## 2015-01-23 NOTE — Assessment & Plan Note (Signed)
Numerous pain complaints, see HPI and problem list, may continue Gabapentin and Hydrocodone but had long discussion regarding need to minimize pain medications at her age. May try Tylenol or Curcumen again

## 2015-01-23 NOTE — Progress Notes (Signed)
Patient ID: Dana Burns, female   DOB: [birth year], 81 y.o.   MRN: [id number]   Dana Burns  [id number] 05-23-1926 01/23/2015      Progress Note-Follow Up  Subjective  Chief Complaint  Chief Complaint  Patient presents with  . Follow-up    3 mos  . B12 Injection    HPI  Patient is a 81 y.o. female in today for routine medical care. Greatest complaints today are all musculoskeletal. She notes stiffness throughout in am. Fell in 9/14 and has pain in right wrist and neck ever since. Not worsening. Stopped Tylenol because she thought it caused epistaxis, which has resolved. C/o neuropathy in arms and legs. No recent illness. Denies CP/palp/SOB/HA/congestion/fevers/GI or GU c/o. Taking meds as prescribed  Past Medical History  Diagnosis Date  . Diabetes mellitus     6 years  . Peripheral neuropathy   . Fibromyalgia   . Colon cancer   . Complication of anesthesia     agitated when waking up, wakes up shaking, "like  I'm going into shock"  . Hypertension     15 years, reports she has a rapid heartrate   . Depression     husband died  3 months ago, pt. admits depression  currently   . Shortness of breath   . GERD (gastroesophageal reflux disease)   . Headache(784.0)     occasional - "bad headaches"  . Osteoarthritis     in hands & all over, feet  . H/O echocardiogram     done /w [place] in 2011, saw Dr. Percival Spanish for tachycardia   . Hyperlipidemia     8 years  . Chicken pox as a child  . Measles as a child  . Allergy     sneeze, rhinorrhea in am  . Pain of right shoulder region 07/07/2014  . DM (diabetes mellitus) type 2, uncontrolled, with ketoacidosis 03/17/2011    Past Surgical History  Procedure Laterality Date  . Cataract extraction      /w IOL- bilateral   . Colon surgery      For resection of cancer  . Nodule removed      From throat  . Breast surgery  2013    right- benign    Family History  Problem Relation Age of Onset  . Cancer Mother     Brain  .  Cancer Father     Colorectal  . Heart disease Neg Hx     Early  . Cancer Sister     breast  . Other Brother     pacemaker  . Arthritis Sister   . Emphysema Brother   . Cancer Brother   . COPD Brother   . Neuropathy Daughter   . Fibromyalgia Daughter     History   Social History  . Marital Status: Widowed    Spouse Name: N/A    Number of Children: 1  . Years of Education: 9   Occupational History  . Retired    Social History Main Topics  . Smoking status: Never Smoker   . Smokeless tobacco: Never Used  . Alcohol Use: No  . Drug Use: No  . Sexual Activity: No     Comment: lives with daughter. no dietary restrictions   Other Topics Concern  . Not on file   Social History Narrative   Patient lives at home with daughter.    Patient is retired.    Patient is widowed.    Patient has 1 child.  Patient has a 9th grade education.     Current Outpatient Prescriptions on File Prior to Visit  Medication Sig Dispense Refill  . acetaminophen (TYLENOL) 500 MG tablet Take 1,000 mg by mouth 2 (two) times daily.    . calcium carbonate (OS-CAL) 600 MG TABS Take 600 mg by mouth 2 (two) times daily with a meal.      . Cholecalciferol (VITAMIN D3) 5000 UNITS CAPS Take 5,000 Units by mouth daily.    . diazepam (VALIUM) 5 MG tablet one and a half po bid prn 45 tablet 3  . Omega-3 Fatty Acids (FISH OIL) 1200 MG CAPS Take 1,200 mg by mouth 2 (two) times daily.       No current facility-administered medications on file prior to visit.    Allergies  Allergen Reactions  . Tape Itching and Rash  . Actos [Pioglitazone Hydrochloride]   . Buspar [Buspirone Hcl]   . Lipitor [Atorvastatin Calcium]   . Penicillins Rash    Review of Systems  Review of Systems  Constitutional: Negative for fever and malaise/fatigue.  HENT: Negative for congestion.   Eyes: Negative for discharge.  Respiratory: Negative for shortness of breath.   Cardiovascular: Negative for chest pain, palpitations  and leg swelling.  Gastrointestinal: Negative for nausea, abdominal pain and diarrhea.  Genitourinary: Negative for dysuria.  Musculoskeletal: Positive for back pain, joint pain and neck pain. Negative for falls.  Skin: Negative for rash.  Neurological: Negative for loss of consciousness and headaches.  Endo/Heme/Allergies: Negative for polydipsia.  Psychiatric/Behavioral: Negative for depression and suicidal ideas. The patient is not nervous/anxious and does not have insomnia.     Objective  BP 140/78 mmHg  Pulse 74  Temp(Src) 97.8 F (36.6 C) (Oral)  Ht 5\' 6"  (1.676 m)  Wt 141 lb 3.2 oz (64.048 kg)  BMI 22.80 kg/m2  SpO2 96%  Physical Exam  Physical Exam  Constitutional: She is oriented to person, place, and time and well-developed, well-nourished, and in no distress. No distress.  HENT:  Head: Normocephalic and atraumatic.  Eyes: Conjunctivae are normal.  Neck: Neck supple. No thyromegaly present.  Cardiovascular: Normal rate, regular rhythm and normal heart sounds.   Pulmonary/Chest: Effort normal and breath sounds normal. She has no wheezes.  Abdominal: She exhibits no distension and no mass.  Musculoskeletal: She exhibits no edema.  Lymphadenopathy:    She has no cervical adenopathy.  Neurological: She is alert and oriented to person, place, and time.  Skin: Skin is warm and dry. No rash noted. She is not diaphoretic.  Psychiatric: Memory, affect and judgment normal.    Lab Results  Component Value Date   TSH 2.74 01/18/2015   Lab Results  Component Value Date   WBC 5.7 01/18/2015   HGB 11.4* 01/18/2015   HCT 35.0* 01/18/2015   MCV 79.8 01/18/2015   PLT 281.0 01/18/2015   Lab Results  Component Value Date   CREATININE 1.17 01/18/2015   BUN 19 01/18/2015   NA 139 01/18/2015   K 4.2 01/18/2015   CL 101 01/18/2015   CO2 32 01/18/2015   Lab Results  Component Value Date   ALT 9 01/18/2015   AST 17 01/18/2015   ALKPHOS 63 01/18/2015   BILITOT 0.3  01/18/2015   Lab Results  Component Value Date   CHOL 170 01/18/2015   Lab Results  Component Value Date   HDL 50.30 01/18/2015   Lab Results  Component Value Date   LDLCALC 96 01/18/2015   Lab  Results  Component Value Date   TRIG 117.0 01/18/2015   Lab Results  Component Value Date   CHOLHDL 3 01/18/2015     Assessment & Plan  Essential hypertension, benign Well controlled, no changes to meds. Encouraged heart healthy diet such as the DASH diet and exercise as tolerated.    Type 2 diabetes mellitus with peripheral neuropathy [id number] acceptable, minimize simple carbs. Increase exercise as tolerated. Continue current meds   Diabetic neuropathy Sugars well controlled, neuropathy manageable. No changes   Hyperlipidemia Tolerating statin, encouraged heart healthy diet, avoid trans fats, minimize simple carbs and saturated fats. Increase exercise as tolerated   Chronic pain syndrome Numerous pain complaints, see HPI and problem list, may continue Gabapentin and Hydrocodone but had long discussion regarding need to minimize pain medications at her age. May try Tylenol or Curcumen again

## 2015-01-23 NOTE — Assessment & Plan Note (Signed)
Sugars well controlled, neuropathy manageable. No changes

## 2015-01-25 ENCOUNTER — Other Ambulatory Visit: Payer: Self-pay | Admitting: *Deleted

## 2015-01-25 MED ORDER — VITAMIN B-12 1000 MCG PO TABS: ORAL_TABLET | ORAL | Status: DC

## 2015-01-25 NOTE — Telephone Encounter (Signed)
Rx sent to the pharmacy by e-script.//AB/CMA 

## 2015-02-17 ENCOUNTER — Ambulatory Visit (INDEPENDENT_AMBULATORY_CARE_PROVIDER_SITE_OTHER): Payer: Medicare Other | Admitting: *Deleted

## 2015-02-17 DIAGNOSIS — E538 Deficiency of other specified B group vitamins: Secondary | ICD-10-CM

## 2015-02-17 MED ORDER — CYANOCOBALAMIN 1000 MCG/ML IJ SOLN
1000.0000 ug | Freq: Once | INTRAMUSCULAR | Status: AC
  Administered 2015-02-17: 1000 ug via INTRAMUSCULAR

## 2015-02-17 NOTE — Progress Notes (Signed)
Pre visit review using our clinic review tool, if applicable. No additional management support is needed unless otherwise documented below in the visit note.  Patient tolerated injection well.  Next appointment scheduled for 03/17/15.

## 2015-03-16 ENCOUNTER — Other Ambulatory Visit: Payer: Self-pay

## 2015-03-16 DIAGNOSIS — Z1231 Encounter for screening mammogram for malignant neoplasm of breast: Secondary | ICD-10-CM

## 2015-03-17 ENCOUNTER — Ambulatory Visit (INDEPENDENT_AMBULATORY_CARE_PROVIDER_SITE_OTHER): Payer: Medicare Other | Admitting: *Deleted

## 2015-03-17 DIAGNOSIS — E538 Deficiency of other specified B group vitamins: Secondary | ICD-10-CM | POA: Diagnosis not present

## 2015-03-17 MED ORDER — CYANOCOBALAMIN 1000 MCG/ML IJ SOLN
1000.0000 ug | Freq: Once | INTRAMUSCULAR | Status: AC
  Administered 2015-03-17: 1000 ug via INTRAMUSCULAR

## 2015-03-17 NOTE — Progress Notes (Signed)
Pre visit review using our clinic review tool, if applicable. No additional management support is needed unless otherwise documented below in the visit note.  Patient tolerated injection well.  Next injection due when patient has an office visit.  Note added to schedule.

## 2015-03-21 ENCOUNTER — Other Ambulatory Visit: Payer: Self-pay | Admitting: Family Medicine

## 2015-03-21 NOTE — Telephone Encounter (Signed)
Med filled and faxed.  

## 2015-03-21 NOTE — Telephone Encounter (Signed)
Last OV 01-08-15 Valium last filled 09-14-14 #45 with 3

## 2015-03-23 ENCOUNTER — Other Ambulatory Visit: Payer: Self-pay | Admitting: Family Medicine

## 2015-03-24 ENCOUNTER — Telehealth: Payer: Self-pay | Admitting: Family Medicine

## 2015-03-24 NOTE — Telephone Encounter (Signed)
Caller name: [name] Relation to WG:[id number] Call back number:[phone] or [phone] Pharmacy:  Reason for call: Pt daughter called stating that called early requesting med diazepam (VALIUM) 5 MG tablet, pt was informed that prescription was done 03-21-15 and sent to pharmacy but pt states pharmacy does not have it. Please advise.

## 2015-03-28 NOTE — Telephone Encounter (Signed)
Called the pharmacy to confirm they did not receive refill faxed in on 03/21/15.  Did give a verbal refill of faxed in prescription on 03/21/15 valium 5 mg #45 with 1 refill. Called the patient to inform prescription has been called and can be picked up later today at her pharmacy.

## 2015-04-15 DIAGNOSIS — E119 Type 2 diabetes mellitus without complications: Secondary | ICD-10-CM | POA: Diagnosis not present

## 2015-04-16 ENCOUNTER — Other Ambulatory Visit: Payer: Self-pay | Admitting: Family Medicine

## 2015-04-19 ENCOUNTER — Ambulatory Visit (INDEPENDENT_AMBULATORY_CARE_PROVIDER_SITE_OTHER): Payer: Medicare Other | Admitting: Family Medicine

## 2015-04-19 ENCOUNTER — Encounter: Payer: Self-pay | Admitting: Family Medicine

## 2015-04-19 VITALS — BP 148/78 | HR 86 | Temp 98.8°F | Ht 66.0 in | Wt 142.5 lb

## 2015-04-19 DIAGNOSIS — E538 Deficiency of other specified B group vitamins: Secondary | ICD-10-CM

## 2015-04-19 DIAGNOSIS — Z79899 Other long term (current) drug therapy: Secondary | ICD-10-CM | POA: Diagnosis not present

## 2015-04-19 DIAGNOSIS — G629 Polyneuropathy, unspecified: Secondary | ICD-10-CM

## 2015-04-19 DIAGNOSIS — G609 Hereditary and idiopathic neuropathy, unspecified: Secondary | ICD-10-CM

## 2015-04-19 DIAGNOSIS — R000 Tachycardia, unspecified: Secondary | ICD-10-CM

## 2015-04-19 DIAGNOSIS — E785 Hyperlipidemia, unspecified: Secondary | ICD-10-CM

## 2015-04-19 DIAGNOSIS — E1140 Type 2 diabetes mellitus with diabetic neuropathy, unspecified: Secondary | ICD-10-CM | POA: Diagnosis not present

## 2015-04-19 DIAGNOSIS — M797 Fibromyalgia: Secondary | ICD-10-CM

## 2015-04-19 DIAGNOSIS — I10 Essential (primary) hypertension: Secondary | ICD-10-CM

## 2015-04-19 DIAGNOSIS — E1142 Type 2 diabetes mellitus with diabetic polyneuropathy: Secondary | ICD-10-CM

## 2015-04-19 DIAGNOSIS — Z79891 Long term (current) use of opiate analgesic: Secondary | ICD-10-CM | POA: Diagnosis not present

## 2015-04-19 DIAGNOSIS — K219 Gastro-esophageal reflux disease without esophagitis: Secondary | ICD-10-CM

## 2015-04-19 MED ORDER — CYANOCOBALAMIN 1000 MCG/ML IJ SOLN
1000.0000 ug | Freq: Once | INTRAMUSCULAR | Status: AC
  Administered 2015-04-19: 1000 ug via INTRAMUSCULAR

## 2015-04-19 MED ORDER — CITALOPRAM HYDROBROMIDE 20 MG PO TABS: 20.0000 mg | ORAL_TABLET | Freq: Every day | ORAL | Status: DC

## 2015-04-19 MED ORDER — HYDROCODONE-ACETAMINOPHEN 5-325 MG PO TABS: 1.0000 | ORAL_TABLET | Freq: Four times a day (QID) | ORAL | Status: DC | PRN

## 2015-04-19 MED ORDER — CITALOPRAM HYDROBROMIDE 10 MG PO TABS: 20.0000 mg | ORAL_TABLET | Freq: Every day | ORAL | Status: DC

## 2015-04-19 NOTE — Patient Instructions (Signed)
Fiber daily or twice daily such as Metamucil or fiber gummies Probiotics daily such as Digestive Advantage or [place]  Basic Carbohydrate Counting for Diabetes Mellitus Carbohydrate counting is a method for keeping track of the amount of carbohydrates you eat. Eating carbohydrates naturally increases the level of sugar (glucose) in your blood, so it is important for you to know the amount that is okay for you to have in every meal. Carbohydrate counting helps keep the level of glucose in your blood within normal limits. The amount of carbohydrates allowed is different for every person. A dietitian can help you calculate the amount that is right for you. Once you know the amount of carbohydrates you can have, you can count the carbohydrates in the foods you want to eat. Carbohydrates are found in the following foods:  Grains, such as breads and cereals.  Dried beans and soy products.  Starchy vegetables, such as potatoes, peas, and corn.  Fruit and fruit juices.  Milk and yogurt.  Sweets and snack foods, such as cake, cookies, candy, chips, soft drinks, and fruit drinks. CARBOHYDRATE COUNTING There are two ways to count the carbohydrates in your food. You can use either of the methods or a combination of both. Reading the "Nutrition Facts" on [place] The "Nutrition Facts" is an area that is included on the labels of almost all packaged food and beverages in the [place]. It includes the serving size of that food or beverage and information about the nutrients in each serving of the food, including the grams (g) of carbohydrate per serving.  Decide the number of servings of this food or beverage that you will be able to eat or drink. Multiply that number of servings by the number of grams of carbohydrate that is listed on the label for that serving. The total will be the amount of carbohydrates you will be having when you eat or drink this food or beverage. Learning  Standard Serving Sizes of Food When you eat food that is not packaged or does not include "Nutrition Facts" on the label, you need to measure the servings in order to count the amount of carbohydrates.A serving of most carbohydrate-rich foods contains about 15 g of carbohydrates. The following list includes serving sizes of carbohydrate-rich foods that provide 15 g ofcarbohydrate per serving:   1 slice of bread (1 oz) or 1 six-inch tortilla.    of a hamburger bun or English muffin.  4-6 crackers.   cup unsweetened dry cereal.    cup hot cereal.   cup rice or pasta.    cup mashed potatoes or  of a large baked potato.  1 cup fresh fruit or one small piece of fruit.    cup canned or frozen fruit or fruit juice.  1 cup milk.   cup plain fat-free yogurt or yogurt sweetened with artificial sweeteners.   cup cooked dried beans or starchy vegetable, such as peas, corn, or potatoes.  Decide the number of standard-size servings that you will eat. Multiply that number of servings by 15 (the grams of carbohydrates in that serving). For example, if you eat 2 cups of strawberries, you will have eaten 2 servings and 30 g of carbohydrates (2 servings x 15 g = 30 g). For foods such as soups and casseroles, in which more than one food is mixed in, you will need to count the carbohydrates in each food that is included. EXAMPLE OF CARBOHYDRATE COUNTING Sample Dinner  3 oz chicken  breast.   cup of brown rice.   cup of corn.  1 cup milk.   1 cup strawberries with sugar-free whipped topping.  Carbohydrate Calculation Step 1: Identify the foods that contain carbohydrates:   Rice.   Corn.   Milk.   Strawberries. Step 2:Calculate the number of servings eaten of each:   2 servings of rice.   1 serving of corn.   1 serving of milk.   1 serving of strawberries. Step 3: Multiply each of those number of servings by 15 g:   2 servings of rice x 15 g = 30 g.    1 serving of corn x 15 g = 15 g.   1 serving of milk x 15 g = 15 g.   1 serving of strawberries x 15 g = 15 g. Step 4: Add together all of the amounts to find the total grams of carbohydrates eaten: 30 g + 15 g + 15 g + 15 g = 75 g. Document Released: 12/17/2005 Document Revised: 05/03/2014 Document Reviewed: 11/13/2013 [facility] Patient Information 2015 [place], [place]. This information is not intended to replace advice given to you by your health care provider. Make sure you discuss any questions you have with your health care provider.

## 2015-04-19 NOTE — Progress Notes (Signed)
Pre visit review using our clinic review tool, if applicable. No additional management support is needed unless otherwise documented below in the visit note. 

## 2015-04-20 ENCOUNTER — Ambulatory Visit
Admission: RE | Admit: 2015-04-20 | Discharge: 2015-04-20 | Disposition: A | Discharge status: Home / Self Care | Payer: Medicare Other | Source: Ambulatory Visit

## 2015-04-20 DIAGNOSIS — Z1231 Encounter for screening mammogram for malignant neoplasm of breast: Secondary | ICD-10-CM

## 2015-04-20 LAB — COMPREHENSIVE METABOLIC PANEL
ALBUMIN: 4.3 g/dL (ref 3.5–5.2)
ALT: 8 U/L (ref 0–35)
AST: 16 U/L (ref 0–37)
Alkaline Phosphatase: 58 U/L (ref 39–117)
BUN: 24 mg/dL — ABNORMAL HIGH (ref 6–23)
CALCIUM: 10.2 mg/dL (ref 8.4–10.5)
CHLORIDE: 102 meq/L (ref 96–112)
CO2: 29 mEq/L (ref 19–32)
Creatinine, Ser: 1.24 mg/dL — ABNORMAL HIGH (ref 0.40–1.20)
GFR: 43.48 mL/min — ABNORMAL LOW (ref >60.00)
Glucose, Bld: 84 mg/dL (ref 70–99)
POTASSIUM: 4.3 meq/L (ref 3.5–5.1)
Sodium: 138 mEq/L (ref 135–145)
TOTAL PROTEIN: 7.3 g/dL (ref 6.0–8.3)
Total Bilirubin: 0.3 mg/dL (ref 0.2–1.2)

## 2015-04-20 LAB — LIPID PANEL
Cholesterol: 160 mg/dL (ref 0–200)
HDL: 45.3 mg/dL (ref >39.00)
LDL Cholesterol: 88 mg/dL (ref 0–99)
NONHDL: 114.7
Total CHOL/HDL Ratio: 4
Triglycerides: 133 mg/dL (ref 0.0–149.0)
VLDL: 26.6 mg/dL (ref 0.0–40.0)

## 2015-04-20 LAB — CBC
HCT: 34.8 % — ABNORMAL LOW (ref 36.0–46.0)
Hemoglobin: 11.4 g/dL — ABNORMAL LOW (ref 12.0–15.0)
MCHC: 32.8 g/dL (ref 30.0–36.0)
MCV: 78.4 fl (ref 78.0–100.0)
Platelets: 255 10*3/uL (ref 150.0–400.0)
RBC: 4.44 Mil/uL (ref 3.87–5.11)
RDW: 16 % — ABNORMAL HIGH (ref 11.5–15.5)
WBC: 5.9 10*3/uL (ref 4.0–10.5)

## 2015-04-20 LAB — VITAMIN B12: Vitamin B-12: >1500 pg/mL — ABNORMAL HIGH (ref 211–911)

## 2015-04-20 LAB — HEMOGLOBIN A1C: Hgb A1c MFr Bld: 6.6 % — ABNORMAL HIGH (ref 4.6–6.5)

## 2015-04-20 LAB — TSH: TSH: 1.95 u[IU]/mL (ref 0.35–4.50)

## 2015-04-24 NOTE — Assessment & Plan Note (Signed)
Avoid offending foods, start probiotics. Do not eat large meals in late evening and consider raising head of bed.  

## 2015-04-24 NOTE — Assessment & Plan Note (Signed)
Given shot today, will continue monthly dosing

## 2015-04-24 NOTE — Assessment & Plan Note (Signed)
RRR 

## 2015-04-24 NOTE — Assessment & Plan Note (Signed)
Manageable with Gabapentin

## 2015-04-24 NOTE — Assessment & Plan Note (Addendum)
Adequately controlled, improved on recheck. no changes to meds. Encouraged heart healthy diet such as the DASH diet and exercise as tolerated. She reports systolic bp in 656C to 127 at home.

## 2015-04-24 NOTE — Assessment & Plan Note (Signed)
Encouraged to stay as active as possible and may continue pain meds prn.

## 2015-04-24 NOTE — Assessment & Plan Note (Signed)
Tolerating statin, encouraged heart healthy diet, avoid trans fats, minimize simple carbs and saturated fats. Increase exercise as tolerated 

## 2015-04-24 NOTE — Assessment & Plan Note (Signed)
hgba1c acceptable, minimize simple carbs. Increase exercise as tolerated. Continue current meds. Sugar was 107 this am at home.

## 2015-04-24 NOTE — Progress Notes (Signed)
Dana Burns  [id number] [birth year] 04/24/2015      Progress Note-Follow Up  Subjective  Chief Complaint  Chief Complaint  Patient presents with  . Follow-up    HPI  Patient is a 81 y.o. female in today for routine medical care. Patient is in today for follow-up. Is generally doing well. Is struggling with some right knee and right hip pain when she lies down at night but otherwise is managing her date well. No recent illness or fevers. Denies CP/palp/SOB/HA/congestion/fevers/GI or GU c/o. Taking meds as prescribed  Past Medical History  Diagnosis Date  . Diabetes mellitus     6 years  . Peripheral neuropathy   . Fibromyalgia   . Colon cancer   . Complication of anesthesia     agitated when waking up, wakes up shaking, "like  I'm going into shock"  . Hypertension     15 years, reports she has a rapid heartrate   . Depression     husband died  3 months ago, pt. admits depression  currently   . Shortness of breath   . GERD (gastroesophageal reflux disease)   . Headache(784.0)     occasional - "bad headaches"  . Osteoarthritis     in hands & all over, feet  . H/O echocardiogram     done /w [place] in 2011, saw Dr. Percival Spanish for tachycardia   . Hyperlipidemia     8 years  . Chicken pox as a child  . Measles as a child  . Allergy     sneeze, rhinorrhea in am  . Pain of right shoulder region 07/07/2014  . DM (diabetes mellitus) type 2, uncontrolled, with ketoacidosis 03/17/2011  . Chronic pain syndrome 01/23/2015  . Type 2 diabetes mellitus with peripheral neuropathy 03/17/2011    Past Surgical History  Procedure Laterality Date  . Cataract extraction      /w IOL- bilateral   . Colon surgery      For resection of cancer  . Nodule removed      From throat  . Breast surgery  2013    right- benign    Family History  Problem Relation Age of Onset  . Cancer Mother     Brain  . Cancer Father     Colorectal  . Heart disease Neg Hx     Early  . Cancer Sister    breast  . Other Brother     pacemaker  . Arthritis Sister   . Emphysema Brother   . Cancer Brother   . COPD Brother   . Neuropathy Daughter   . Fibromyalgia Daughter     History   Social History  . Marital Status: Widowed    Spouse Name: N/A  . Number of Children: 1  . Years of Education: 9   Occupational History  . Retired    Social History Main Topics  . Smoking status: Never Smoker   . Smokeless tobacco: Never Used  . Alcohol Use: No  . Drug Use: No  . Sexual Activity: No     Comment: lives with daughter. no dietary restrictions   Other Topics Concern  . Not on file   Social History Narrative   Patient lives at home with daughter.    Patient is retired.    Patient is widowed.    Patient has 1 child.    Patient has a 9th grade education.     Current Outpatient Prescriptions on File Prior to Visit  Medication Sig Dispense Refill  . calcium carbonate (OS-CAL) 600 MG TABS Take 600 mg by mouth 2 (two) times daily with a meal.      . Cholecalciferol (VITAMIN D3) 5000 UNITS CAPS Take 5,000 Units by mouth daily.    . diazepam (VALIUM) 5 MG tablet TAKE 1 & 1/2 TABLET TWICE A DAY AS NEEDED 45 tablet 1  . gabapentin (NEURONTIN) 800 MG tablet Take 1 tablet (800 mg total) by mouth 3 (three) times daily. 90 tablet 3  . metFORMIN (GLUCOPHAGE) 500 MG tablet Take 1 tablet (500 mg total) by mouth daily before breakfast. 30 tablet 3  . Omega-3 Fatty Acids (FISH OIL) 1200 MG CAPS Take 1,200 mg by mouth 2 (two) times daily.      Marland Kitchen omeprazole (PRILOSEC) 20 MG capsule TAKE 1 CAPSULE TWICE A DAY 60 capsule 2  . pravastatin (PRAVACHOL) 40 MG tablet Take 1 tablet (40 mg total) by mouth at bedtime. 30 tablet 4  . valsartan-hydrochlorothiazide (DIOVAN-HCT) 160-12.5 MG per tablet Take 1 tablet by mouth daily. 30 tablet 3  . vitamin B-12 (CYANOCOBALAMIN) 1000 MCG tablet Place 1 tablet under the tongue daily. 100 tablet 1  . acetaminophen (TYLENOL) 500 MG tablet Take 1,000 mg by mouth 2  (two) times daily.     No current facility-administered medications on file prior to visit.    Allergies  Allergen Reactions  . Tape Itching and Rash  . Actos [Pioglitazone Hydrochloride]   . Buspar [Buspirone Hcl]   . Lipitor [Atorvastatin Calcium]   . Penicillins Rash    Review of Systems  Review of Systems  Constitutional: Negative for fever and malaise/fatigue.  HENT: Negative for congestion.   Eyes: Negative for discharge.  Respiratory: Negative for shortness of breath.   Cardiovascular: Negative for chest pain, palpitations and leg swelling.  Gastrointestinal: Negative for nausea, abdominal pain and diarrhea.  Genitourinary: Negative for dysuria.  Musculoskeletal: Positive for joint pain. Negative for falls.  Skin: Negative for rash.  Neurological: Negative for loss of consciousness and headaches.  Endo/Heme/Allergies: Negative for polydipsia.  Psychiatric/Behavioral: Negative for depression and suicidal ideas. The patient is not nervous/anxious and does not have insomnia.     Objective  BP 148/78 mmHg  Pulse 86  Temp(Src) 98.8 F (37.1 C) (Oral)  Ht 5\' 6"  (1.676 m)  Wt 142 lb 8 oz (64.638 kg)  BMI 23.01 kg/m2  SpO2 95%  Physical Exam  Physical Exam  Constitutional: She is oriented to person, place, and time and well-developed, well-nourished, and in no distress. No distress.  HENT:  Head: Normocephalic and atraumatic.  Eyes: Conjunctivae are normal.  Neck: Neck supple. No thyromegaly present.  Cardiovascular: Normal rate, regular rhythm and normal heart sounds.   No murmur heard. Pulmonary/Chest: Effort normal and breath sounds normal. She has no wheezes.  Abdominal: She exhibits no distension and no mass.  Musculoskeletal: She exhibits no edema.  Lymphadenopathy:    She has no cervical adenopathy.  Neurological: She is alert and oriented to person, place, and time.  Skin: Skin is warm and dry. No rash noted. She is not diaphoretic.  Psychiatric:  Memory, affect and judgment normal.    Lab Results  Component Value Date   TSH 1.95 04/19/2015   Lab Results  Component Value Date   WBC 5.9 04/19/2015   HGB 11.4* 04/19/2015   HCT 34.8* 04/19/2015   MCV 78.4 04/19/2015   PLT 255.0 04/19/2015   Lab Results  Component Value Date   CREATININE 1.24*  04/19/2015   BUN 24* 04/19/2015   NA 138 04/19/2015   K 4.3 04/19/2015   CL 102 04/19/2015   CO2 29 04/19/2015   Lab Results  Component Value Date   ALT 8 04/19/2015   AST 16 04/19/2015   ALKPHOS 58 04/19/2015   BILITOT 0.3 04/19/2015   Lab Results  Component Value Date   CHOL 160 04/19/2015   Lab Results  Component Value Date   HDL 45.30 04/19/2015   Lab Results  Component Value Date   LDLCALC 88 04/19/2015   Lab Results  Component Value Date   TRIG 133.0 04/19/2015   Lab Results  Component Value Date   CHOLHDL 4 04/19/2015     Assessment & Plan  Essential hypertension, benign Adequately controlled, improved on recheck. no changes to meds. Encouraged heart healthy diet such as the DASH diet and exercise as tolerated. She reports systolic bp in 408X to 448 at home.    GERD (gastroesophageal reflux disease) Avoid offending foods, start probiotics. Do not eat large meals in late evening and consider raising head of bed.    Type 2 diabetes mellitus with peripheral neuropathy hgba1c acceptable, minimize simple carbs. Increase exercise as tolerated. Continue current meds. Sugar was 107 this am at home.   Hereditary and idiopathic peripheral neuropathy Manageable with Gabapentin   Hyperlipidemia Tolerating statin, encouraged heart healthy diet, avoid trans fats, minimize simple carbs and saturated fats. Increase exercise as tolerated   Vitamin B 12 deficiency Given shot today, will continue monthly dosing   Fibromyalgia Encouraged to stay as active as possible and may continue pain meds prn.   Tachycardia RRR

## 2015-05-19 ENCOUNTER — Other Ambulatory Visit: Payer: Self-pay | Admitting: Family Medicine

## 2015-05-19 MED ORDER — GABAPENTIN 800 MG PO TABS: 800.0000 mg | ORAL_TABLET | Freq: Three times a day (TID) | ORAL | Status: DC

## 2015-05-24 ENCOUNTER — Ambulatory Visit (INDEPENDENT_AMBULATORY_CARE_PROVIDER_SITE_OTHER): Payer: Medicare Other | Admitting: *Deleted

## 2015-05-24 DIAGNOSIS — E538 Deficiency of other specified B group vitamins: Secondary | ICD-10-CM | POA: Diagnosis not present

## 2015-05-24 MED ORDER — CYANOCOBALAMIN 1000 MCG/ML IJ SOLN
1000.0000 ug | Freq: Once | INTRAMUSCULAR | Status: AC
  Administered 2015-05-24: 1000 ug via INTRAMUSCULAR

## 2015-05-24 NOTE — Progress Notes (Signed)
Pre visit review using our clinic review tool, if applicable. No additional management support is needed unless otherwise documented below in the visit note. 

## 2015-06-21 ENCOUNTER — Ambulatory Visit (INDEPENDENT_AMBULATORY_CARE_PROVIDER_SITE_OTHER): Payer: Medicare Other

## 2015-06-21 DIAGNOSIS — E538 Deficiency of other specified B group vitamins: Secondary | ICD-10-CM | POA: Diagnosis not present

## 2015-06-21 MED ORDER — CYANOCOBALAMIN 1000 MCG/ML IJ SOLN
1000.0000 ug | Freq: Once | INTRAMUSCULAR | Status: AC
  Administered 2015-06-21: 1000 ug via INTRAMUSCULAR

## 2015-06-21 NOTE — Progress Notes (Signed)
Pre visit review using our clinic review tool, if applicable. No additional management support is needed unless otherwise documented below in the visit note.  Pt tolerated injection well.    

## 2015-07-07 DIAGNOSIS — E119 Type 2 diabetes mellitus without complications: Secondary | ICD-10-CM | POA: Diagnosis not present

## 2015-07-10 ENCOUNTER — Other Ambulatory Visit: Payer: Self-pay | Admitting: Family Medicine

## 2015-07-13 ENCOUNTER — Other Ambulatory Visit: Payer: Self-pay | Admitting: Family Medicine

## 2015-07-14 ENCOUNTER — Other Ambulatory Visit: Payer: Self-pay | Admitting: Family Medicine

## 2015-07-26 ENCOUNTER — Encounter: Payer: Self-pay | Admitting: Family Medicine

## 2015-07-26 ENCOUNTER — Ambulatory Visit (INDEPENDENT_AMBULATORY_CARE_PROVIDER_SITE_OTHER): Payer: Medicare Other | Admitting: Family Medicine

## 2015-07-26 VITALS — BP 138/70 | HR 79 | Temp 97.7°F | Ht 66.0 in | Wt 140.4 lb

## 2015-07-26 DIAGNOSIS — M8580 Other specified disorders of bone density and structure, unspecified site: Secondary | ICD-10-CM | POA: Diagnosis not present

## 2015-07-26 DIAGNOSIS — E1142 Type 2 diabetes mellitus with diabetic polyneuropathy: Secondary | ICD-10-CM | POA: Diagnosis not present

## 2015-07-26 DIAGNOSIS — E538 Deficiency of other specified B group vitamins: Secondary | ICD-10-CM

## 2015-07-26 DIAGNOSIS — I10 Essential (primary) hypertension: Secondary | ICD-10-CM | POA: Diagnosis not present

## 2015-07-26 DIAGNOSIS — R000 Tachycardia, unspecified: Secondary | ICD-10-CM

## 2015-07-26 DIAGNOSIS — E785 Hyperlipidemia, unspecified: Secondary | ICD-10-CM

## 2015-07-26 DIAGNOSIS — E782 Mixed hyperlipidemia: Secondary | ICD-10-CM | POA: Diagnosis not present

## 2015-07-26 DIAGNOSIS — G629 Polyneuropathy, unspecified: Secondary | ICD-10-CM | POA: Diagnosis not present

## 2015-07-26 DIAGNOSIS — E1149 Type 2 diabetes mellitus with other diabetic neurological complication: Secondary | ICD-10-CM

## 2015-07-26 MED ORDER — HYDROCODONE-ACETAMINOPHEN 5-325 MG PO TABS: 1.0000 | ORAL_TABLET | Freq: Four times a day (QID) | ORAL | Status: DC | PRN

## 2015-07-26 MED ORDER — CYANOCOBALAMIN 1000 MCG/ML IJ SOLN
1000.0000 ug | Freq: Once | INTRAMUSCULAR | Status: AC
  Administered 2015-07-26: 1000 ug via INTRAMUSCULAR

## 2015-07-26 NOTE — Patient Instructions (Signed)

## 2015-07-27 ENCOUNTER — Telehealth: Payer: Self-pay

## 2015-07-27 LAB — CBC
HCT: 36.7 % (ref 36.0–46.0)
HEMOGLOBIN: 11.9 g/dL — AB (ref 12.0–15.0)
MCHC: 32.4 g/dL (ref 30.0–36.0)
MCV: 79.8 fl (ref 78.0–100.0)
Platelets: 241 10*3/uL (ref 150.0–400.0)
RBC: 4.6 Mil/uL (ref 3.87–5.11)
RDW: 17.8 % — AB (ref 11.5–15.5)
WBC: 5.3 10*3/uL (ref 4.0–10.5)

## 2015-07-27 LAB — LIPID PANEL
CHOLESTEROL: 179 mg/dL (ref 0–200)
HDL: 46.4 mg/dL (ref >39.00)
LDL Cholesterol: 104 mg/dL — ABNORMAL HIGH (ref 0–99)
NONHDL: 132.6
TRIGLYCERIDES: 143 mg/dL (ref 0.0–149.0)
Total CHOL/HDL Ratio: 4
VLDL: 28.6 mg/dL (ref 0.0–40.0)

## 2015-07-27 LAB — TSH: TSH: 2.2 u[IU]/mL (ref 0.35–4.50)

## 2015-07-27 LAB — HEMOGLOBIN A1C: HEMOGLOBIN A1C: 6.4 % (ref 4.6–6.5)

## 2015-07-27 LAB — VITAMIN B12: Vitamin B-12: >1500 pg/mL — ABNORMAL HIGH (ref 211–911)

## 2015-07-27 LAB — VITAMIN D 25 HYDROXY (VIT D DEFICIENCY, FRACTURES): VITD: 96.43 ng/mL (ref 30.00–100.00)

## 2015-07-27 NOTE — Telephone Encounter (Signed)
Attempted call. Line is busy.

## 2015-07-27 NOTE — Telephone Encounter (Signed)
She would like to know if she should stop taking the Vit B12 injections as well?

## 2015-07-27 NOTE — Telephone Encounter (Signed)
-----   Message from Mosie Lukes, MD sent at 07/27/2015 12:56 PM EDT ----- Notify labs look pretty good. Vitamin B12 is still hi, we agreed she will stop her oral vitamin B12 for now and recheck in 3 months. Everything else looks good

## 2015-07-27 NOTE — Telephone Encounter (Signed)
No I think she should continue shots til we recheck in 3 months

## 2015-08-07 ENCOUNTER — Encounter: Payer: Self-pay | Admitting: Family Medicine

## 2015-08-07 NOTE — Assessment & Plan Note (Signed)
Discussed the possibility of medications to manage but she agrees symptoms are not bad enough to use a medication. Discussed need to control blood sugars to prevent progression

## 2015-08-07 NOTE — Assessment & Plan Note (Signed)
Well controlled, no changes to meds. Encouraged heart healthy diet such as the DASH diet and exercise as tolerated.  °

## 2015-08-07 NOTE — Assessment & Plan Note (Signed)
RRR 

## 2015-08-07 NOTE — Assessment & Plan Note (Signed)
Shot given today

## 2015-08-07 NOTE — Progress Notes (Signed)
Dana Burns [id number] December 01, 1926 08/07/2015      Progress Note New Patient  Subjective  Chief Complaint  Chief Complaint  Patient presents with  . Follow-up    HPI  Patient is a 81 year old female in today for routine medical care. She is in todayfor routine follow-up. She continuesto struggle with decreased sensat in her feet. She reports the pain is tolerableillness. She does continue to struggle with myalgias and arthralgias but manages with minimal use of pain medications to get through her day. Denies CP/palp/SOB/HA/congestion/fevers/GI or GU c/o. Taking meds as prescribed  Past Medical History  Diagnosis Date  . Diabetes mellitus     6 years  . Peripheral neuropathy   . Fibromyalgia   . Colon cancer   . Complication of anesthesia     agitated when waking up, wakes up shaking, "like  I'm going into shock"  . Hypertension     15 years, reports she has a rapid heartrate   . Depression     husband died  3 months ago, pt. admits depression  currently   . Shortness of breath   . GERD (gastroesophageal reflux disease)   . Headache(784.0)     occasional - "bad headaches"  . Osteoarthritis     in hands & all over, feet  . H/O echocardiogram     done /w [place] in 2011, saw Dr. Percival Spanish for tachycardia   . Hyperlipidemia     6 years  . Chicken pox as a child  . Measles as a child  . Allergy     sneeze, rhinorrhea in am  . Pain of right shoulder region 07/07/2014  . DM (diabetes mellitus) type 2, uncontrolled, with ketoacidosis 03/17/2011  . Chronic pain syndrome 01/23/2015  . Type 2 diabetes mellitus with peripheral neuropathy 03/17/2011    Past Surgical History  Procedure Laterality Date  . Cataract extraction      /w IOL- bilateral   . Colon surgery      For resection of cancer  . Nodule removed      From throat  . Breast surgery  2013    right- benign    Family History  Problem Relation Age of Onset  . Cancer Mother     Brain  . Cancer Father      Colorectal  . Heart disease Neg Hx     Early  . Cancer Sister     breast  . Other Brother     pacemaker  . Arthritis Sister   . Emphysema Brother   . Cancer Brother   . COPD Brother   . Neuropathy Daughter   . Fibromyalgia Daughter     History   Social History  . Marital Status: Widowed    Spouse Name: N/A  . Number of Children: 1  . Years of Education: 9   Occupational History  . Retired    Social History Main Topics  . Smoking status: Never Smoker   . Smokeless tobacco: Never Used  . Alcohol Use: No  . Drug Use: No  . Sexual Activity: No     Comment: lives with daughter. no dietary restrictions   Other Topics Concern  . Not on file   Social History Narrative   Patient lives at home with daughter.    Patient is retired.    Patient is widowed.    Patient has 1 child.    Patient has a 9th grade education.     Current Outpatient  Prescriptions on File Prior to Visit  Medication Sig Dispense Refill  . acetaminophen (TYLENOL) 500 MG tablet Take 1,000 mg by mouth 2 (two) times daily.    . calcium carbonate (OS-CAL) 600 MG TABS Take 600 mg by mouth 2 (two) times daily with a meal.      . Cholecalciferol (VITAMIN D3) 5000 UNITS CAPS Take 5,000 Units by mouth daily.    . citalopram (CELEXA) 20 MG tablet Take 1 tablet (20 mg total) by mouth daily. 30 tablet 3  . diazepam (VALIUM) 5 MG tablet TAKE 1 & 1/2 TABLET TWICE A DAY AS NEEDED 45 tablet 1  . gabapentin (NEURONTIN) 800 MG tablet TAKE 1 TABLET (800 MG TOTAL) BY MOUTH 3 (THREE) TIMES DAILY. 90 tablet 1  . metFORMIN (GLUCOPHAGE) 500 MG tablet TAKE 1 TABLET (500 MG TOTAL) BY MOUTH DAILY BEFORE BREAKFAST. 30 tablet 6  . Omega-3 Fatty Acids (FISH OIL) 1200 MG CAPS Take 1,200 mg by mouth 2 (two) times daily.      Marland Kitchen omeprazole (PRILOSEC) 20 MG capsule TAKE 1 CAPSULE TWICE A DAY 60 capsule 2  . pravastatin (PRAVACHOL) 40 MG tablet Take 1 tablet (40 mg total) by mouth at bedtime. 30 tablet 4  . valsartan-hydrochlorothiazide  (DIOVAN-HCT) 160-12.5 MG per tablet Take 1 tablet by mouth daily. 30 tablet 3  . vitamin B-12 (CYANOCOBALAMIN) 1000 MCG tablet Place 1 tablet under the tongue daily. 100 tablet 1   No current facility-administered medications on file prior to visit.    Allergies  Allergen Reactions  . Tape Itching and Rash  . Actos [Pioglitazone Hydrochloride]   . Buspar [Buspirone Hcl]   . Lipitor [Atorvastatin Calcium]   . Penicillins Rash    Review of Systems  Review of Systems  Constitutional: Negative for fever and malaise/fatigue.  HENT: Negative for congestion.   Eyes: Negative for discharge.  Respiratory: Negative for shortness of breath.   Cardiovascular: Negative for chest pain, palpitations and leg swelling.  Gastrointestinal: Negative for nausea, abdominal pain and diarrhea.  Genitourinary: Negative for dysuria.  Musculoskeletal: Positive for myalgias and back pain. Negative for falls.  Skin: Negative for rash.  Neurological: Positive for sensory change. Negative for loss of consciousness and headaches.  Endo/Heme/Allergies: Negative for polydipsia.  Psychiatric/Behavioral: Negative for depression and suicidal ideas. The patient is not nervous/anxious and does not have insomnia.     Objective  BP 138/70 mmHg  Pulse 79  Temp(Src) 97.7 F (36.5 C) (Oral)  Ht 5\' 6"  (1.676 m)  Wt 140 lb 6 oz (63.674 kg)  BMI 22.67 kg/m2  SpO2 96%  Physical Exam  Physical Exam  Constitutional: She is oriented to person, place, and time and well-developed, well-nourished, and in no distress. No distress.  HENT:  Head: Normocephalic and atraumatic.  Eyes: Conjunctivae are normal.  Neck: Neck supple. No thyromegaly present.  Cardiovascular: Normal rate, regular rhythm and normal heart sounds.   No murmur heard. Pulmonary/Chest: Effort normal and breath sounds normal. She has no wheezes.  Abdominal: She exhibits no distension and no mass.  Musculoskeletal: She exhibits no edema.   Lymphadenopathy:    She has no cervical adenopathy.  Neurological: She is alert and oriented to person, place, and time.  Skin: Skin is warm and dry. No rash noted. She is not diaphoretic.  Psychiatric: Memory, affect and judgment normal.       Assessment & Plan  Tachycardia RRR  Vitamin B 12 deficiency Shot given today  Diabetic neuropathy Discussed the possibility of  medications to manage but she agrees symptoms are not bad enough to use a medication. Discussed need to control blood sugars to prevent progression  Hyperlipidemia Tolerating statin, encouraged heart healthy diet, avoid trans fats, minimize simple carbs and saturated fats. Increase exercise as tolerated  Essential hypertension, benign Well controlled, no changes to meds. Encouraged heart healthy diet such as the DASH diet and exercise as tolerated.   Type 2 diabetes mellitus with peripheral neuropathy hgba1c acceptable, minimize simple carbs. Increase exercise as tolerated. Continue current meds

## 2015-08-07 NOTE — Assessment & Plan Note (Signed)
hgba1c acceptable, minimize simple carbs. Increase exercise as tolerated. Continue current meds 

## 2015-08-07 NOTE — Assessment & Plan Note (Signed)
Tolerating statin, encouraged heart healthy diet, avoid trans fats, minimize simple carbs and saturated fats. Increase exercise as tolerated 

## 2015-08-20 ENCOUNTER — Other Ambulatory Visit: Payer: Self-pay | Admitting: Family Medicine

## 2015-08-22 ENCOUNTER — Other Ambulatory Visit: Payer: Self-pay | Admitting: Family Medicine

## 2015-08-22 MED ORDER — PANTOPRAZOLE SODIUM 40 MG PO TBEC: 40.0000 mg | DELAYED_RELEASE_TABLET | Freq: Every day | ORAL | Status: DC

## 2015-08-23 ENCOUNTER — Ambulatory Visit (INDEPENDENT_AMBULATORY_CARE_PROVIDER_SITE_OTHER): Payer: Medicare Other

## 2015-08-23 DIAGNOSIS — E538 Deficiency of other specified B group vitamins: Secondary | ICD-10-CM | POA: Diagnosis not present

## 2015-08-23 MED ORDER — CYANOCOBALAMIN 1000 MCG/ML IJ SOLN
1000.0000 ug | Freq: Once | INTRAMUSCULAR | Status: AC
  Administered 2015-08-23: 1000 ug via INTRAMUSCULAR

## 2015-08-23 NOTE — Progress Notes (Signed)
Pre visit review using our clinic review tool, if applicable. No additional management support is needed unless otherwise documented below in the visit note.  Patient tolerated injection well.  

## 2015-08-24 ENCOUNTER — Other Ambulatory Visit: Payer: Self-pay | Admitting: Family Medicine

## 2015-09-08 ENCOUNTER — Other Ambulatory Visit: Payer: Self-pay | Admitting: Family Medicine

## 2015-09-08 NOTE — Telephone Encounter (Signed)
Requesting: Diazepam Contract 04/19/15 UDS  Low risk Last OV  07/26/15 Last Refill  #[phone] with 1 refill on 03/21/15  Please Advise

## 2015-09-08 NOTE — Telephone Encounter (Signed)
Faxed hardcopy for Valium to [facility] [address]

## 2015-09-21 ENCOUNTER — Telehealth: Payer: Self-pay | Admitting: Behavioral Health

## 2015-09-21 NOTE — Telephone Encounter (Signed)
Confirmed appointment for tomorrow, 09/22/15 at 2:30 PM.

## 2015-09-22 ENCOUNTER — Ambulatory Visit (INDEPENDENT_AMBULATORY_CARE_PROVIDER_SITE_OTHER): Payer: Medicare Other

## 2015-09-22 ENCOUNTER — Ambulatory Visit: Payer: Medicare Other

## 2015-09-22 VITALS — BP 118/74 | HR 105 | Ht 66.0 in | Wt 142.8 lb

## 2015-09-22 DIAGNOSIS — E538 Deficiency of other specified B group vitamins: Secondary | ICD-10-CM | POA: Diagnosis not present

## 2015-09-22 DIAGNOSIS — Z0000 Encounter for general adult medical examination without abnormal findings: Secondary | ICD-10-CM

## 2015-09-22 MED ORDER — CYANOCOBALAMIN 1000 MCG/ML IJ SOLN
1000.0000 ug | Freq: Once | INTRAMUSCULAR | Status: AC
  Administered 2015-09-22: 1000 ug via INTRAMUSCULAR

## 2015-09-22 NOTE — Patient Instructions (Addendum)
Schedule eye exam.  Schedule to receive Pneumonia 23 and Flu vaccine.  Increase physical activity slowly (walks around neighborhood and chair exercises as tolerated).    Limit the number of sweets and breads consumed daily.   Follow with Dr. Charlett Blake as scheduled.     Screening for Type 2 Diabetes Screening is a way to check for type 2 diabetes in people who do not have symptoms of the disease, but who may likely develop diabetes in the future. Diabetes can lead to serious health problems, but finding diabetes early allows for early treatment. DIABETES RISK FACTORS   Family history of diabetes.  Diseases of the pancreas.  Obesity or being overweight.  Certain racial or ethnic groups:  American [place].  Pacific Islander.  Hispanic.  Asian.  African American.  High blood pressure (hypertension).  History of diabetes while pregnant (gestational diabetes).  Delivering a baby that weighed over 9 pounds.  Being inactive.  High cholesterol or triglycerides.  Age, especially over 79 years of age. WHO IS SCREENED Adults  Adults who have no risk factors and no symptoms should be screened starting at age 79. If the screening tests are normal, they should be repeated every 3 years.  Adults who do not have symptoms, but are overweight, should be screened before age 79.  Adults who do not have symptoms, but have 1 or more risk factors, should be screened.  Adults who have an A1c (3 month average of blood glucose) greater than 5.7% or who had an impaired glucose tolerance (IGT) or impaired fasting glucose (IFG) on a previous test should be screened.  Pregnant women with or without risk factors should be screened.  Women who gave birth and had gestational diabetes should be screened. This testing should be done 6 to 12 weeks after the child is born. Children or Adolescents  Children and adolescents should be screened for type 2 diabetes if they are overweight and have 2 of  the following risk factors:  Having a family history of type 2 diabetes.  Being a member of a high risk race or ethnic group.  Having signs of insulin resistance or conditions associated with insulin resistance.  Having a mother who had gestational diabetes while pregnant with him or her.  Screening should start at age 79 or at the onset of puberty, whichever comes first. This should be repeated every 2 years. SCREENING In a screening, your caregiver may:  Ask questions about your overall health. This will include questions about the health of close family members, too.  Ask about any diabetes-like symptoms you may have.  Perform a physical exam.  Order some tests that may include:  A fasting plasma glucose test. This measures the level of glucose in your blood. It is done after you have had nothing to eat but water (fasted) for 8 hours.  A random blood glucose test. This test is done without the need to fast.  An oral glucose tolerance test. This is a blood test done in 2 parts. First, a blood sample is taken after you have fasted. Then, another sample is taken after you drink a liquid that contains a lot of sugar.  An A1c test. This test shows how much glucose has been in your blood over the past 2 to 3 months. Document Released: 10/13/2009 Document Revised: 03/10/2012 Document Reviewed: 07/25/2011 [facility] Patient Information 2015 [place], [place]. This information is not intended to replace advice given to you by your health care provider. Make sure you  discuss any questions you have with your health care provider.  Fall Prevention and Home Safety Falls cause injuries and can affect all age groups. It is possible to prevent falls.  HOW TO PREVENT FALLS  Wear shoes with rubber soles that do not have an opening for your toes.  Keep the inside and outside of your house well lit.  Use night lights throughout your home.  Remove clutter from floors.  Clean up floor  spills.  Remove throw rugs or fasten them to the floor with carpet tape.  Do not place electrical cords across pathways.  Put grab bars by your tub, shower, and toilet. Do not use towel bars as grab bars.  Put handrails on both sides of the stairway. Fix loose handrails.  Do not climb on stools or stepladders, if possible.  Do not wax your floors.  Repair uneven or unsafe sidewalks, walkways, or stairs.  Keep items you use a lot within reach.  Be aware of pets.  Keep emergency numbers next to the telephone.  Put smoke detectors in your home and near bedrooms. Ask your doctor what other things you can do to prevent falls. Document Released: 10/13/2009 Document Revised: 06/17/2012 Document Reviewed: 03/18/2012 [facility] Patient Information 2015 [place], [place]. This information is not intended to replace advice given to you by your health care provider. Make sure you discuss any questions you have with your health care provider.

## 2015-09-22 NOTE — Progress Notes (Signed)
Pre visit review using our clinic review tool, if applicable. No additional management support is needed unless otherwise documented below in the visit note. 

## 2015-09-22 NOTE — Progress Notes (Signed)
Subjective:   Dana Burns is a 81 y.o. female who presents for an Initial Medicare Annual Wellness Visit.  Review of Systems: No ROS  Sleep patterns:   Sleeps fairly well.  Falls asleep on couch around 9 pm and then gets up around 12 am to go to bed.   Home Safety/Smoke Alarms:  Feels safe at home. Lives with daughter and 2 dogs.  1 story home.  Smoke alarms and alarm system.   Firearm Safety:  No firearms Seat Belt Safety/Bike Helmet:  Always wears seat belts.    Counseling:   Eye Exam- Goes every year.  Has not been this year.  DUE.  Encouraged to schedule an appointment.   Dental- Goes every 6 months.   Female:  Pap- No longer receive.     Mammo- 04/20/15-normal     Dexa scan- 01/19/14      [facility]-2 years ago and was told that she didn't have to go back.   Immunization: PPSV23 and Flu vaccine-Due----Plans to receive both at next office visit.    Objective:    Today's Vitals   09/22/15 1424  BP: 118/74  Pulse: 105  Height: 5\' 6"  (1.676 m)  Weight: 142 lb 12.8 oz (64.774 kg)  SpO2: 93%  PainSc: 10-Worst pain ever    Current Medications (verified) Outpatient Encounter Prescriptions as of 09/22/2015  Medication Sig  . acetaminophen (TYLENOL) 500 MG tablet Take 1,000 mg by mouth 2 (two) times daily.  . calcium carbonate (OS-CAL) 600 MG TABS Take 600 mg by mouth 2 (two) times daily with a meal.    . Cholecalciferol (VITAMIN D3) 5000 UNITS CAPS Take 5,000 Units by mouth daily.  . citalopram (CELEXA) 10 MG tablet TAKE 1 TABLET (10 MG TOTAL) BY MOUTH DAILY.  . diazepam (VALIUM) 5 MG tablet TAKE 1 AND A HALF TABLETS BY MOUTH TWICE A DAY AS NEEDED  . gabapentin (NEURONTIN) 800 MG tablet TAKE 1 TABLET (800 MG TOTAL) BY MOUTH 3 (THREE) TIMES DAILY.  Marland Kitchen HYDROcodone-acetaminophen (NORCO/VICODIN) 5-325 MG per tablet Take 1 tablet by mouth every 6 (six) hours as needed for moderate pain.  . metFORMIN (GLUCOPHAGE) 500 MG tablet TAKE 1 TABLET (500 MG TOTAL) BY MOUTH DAILY BEFORE BREAKFAST.  Marland Kitchen  Omega-3 Fatty Acids (FISH OIL) 1200 MG CAPS Take 1,200 mg by mouth 2 (two) times daily.    . pantoprazole (PROTONIX) 40 MG tablet Take 1 tablet (40 mg total) by mouth daily.  . pravastatin (PRAVACHOL) 40 MG tablet Take 1 tablet (40 mg total) by mouth at bedtime.  . valsartan-hydrochlorothiazide (DIOVAN-HCT) 160-12.5 MG per tablet Take 1 tablet by mouth daily.  . [DISCONTINUED] cyanocobalamin (,VITAMIN B-12,) 1000 MCG/ML injection Inject 1,000 mcg into the muscle once.  . [DISCONTINUED] vitamin B-12 (CYANOCOBALAMIN) 1000 MCG tablet Place 1 tablet under the tongue daily.  . [EXPIRED] cyanocobalamin ((VITAMIN B-12)) injection 1,000 mcg    No facility-administered encounter medications on file as of 09/22/2015.    Allergies (verified) Tape; Actos; Buspar; Lipitor; and Penicillins   History: Past Medical History  Diagnosis Date  . Diabetes mellitus     8 years  . Peripheral neuropathy   . Fibromyalgia   . Colon cancer   . Complication of anesthesia     agitated when waking up, wakes up shaking, "like  I'm going into shock"  . Hypertension     15 years, reports she has a rapid heartrate   . Depression     husband died  3 months  ago, pt. admits depression  currently   . Shortness of breath   . GERD (gastroesophageal reflux disease)   . Headache(784.0)     occasional - "bad headaches"  . Osteoarthritis     in hands & all over, feet  . H/O echocardiogram     done /w [place] in 2011, saw Dr. Percival Spanish for tachycardia   . Hyperlipidemia     8 years  . Chicken pox as a child  . Measles as a child  . Allergy     sneeze, rhinorrhea in am  . Pain of right shoulder region 07/07/2014  . DM (diabetes mellitus) type 2, uncontrolled, with ketoacidosis 03/17/2011  . Chronic pain syndrome 01/23/2015  . Type 2 diabetes mellitus with peripheral neuropathy 03/17/2011   Past Surgical History  Procedure Laterality Date  . Cataract extraction      /w IOL- bilateral   . Colon surgery      For  resection of cancer  . Nodule removed      From throat  . Breast surgery  2013    right- benign   Family History  Problem Relation Age of Onset  . Cancer Mother     Brain  . Cancer Father     Colorectal  . Heart disease Neg Hx     Early  . Cancer Sister     breast  . Other Brother     pacemaker  . Arthritis Sister   . Emphysema Brother   . Cancer Brother   . COPD Brother   . Neuropathy Daughter   . Fibromyalgia Daughter    Social History   Occupational History  . Retired    Social History Main Topics  . Smoking status: Never Smoker   . Smokeless tobacco: Never Used  . Alcohol Use: No  . Drug Use: No  . Sexual Activity: No     Comment: lives with daughter. no dietary restrictions    Tobacco Counseling Counseling given: Yes   Activities of Daily Living In your present state of health, do you have any difficulty performing the following activities: 09/22/2015 01/18/2015  Hearing? Y N  Vision? N N  Difficulty concentrating or making decisions? N N  Walking or climbing stairs? Y N  Dressing or bathing? N N  Doing errands, shopping? N N  Preparing Food and eating ? N -  Using the Toilet? Y -  In the past six months, have you accidently leaked urine? Y -  Do you have problems with loss of bowel control? Y -  Managing your Medications? N -  Managing your Finances? Y -  Housekeeping or managing your Housekeeping? Y -    Immunizations and Health Maintenance Immunization History  Administered Date(s) Administered  . Influenza Whole 09/30/2010  . Influenza,inj,Quad PF,36+ Mos 08/24/2014  . Influenza-Unspecified 01/04/2014  . Pneumococcal Conjugate-13 09/14/2014  . Td 01/01/2004  . Tdap 10/17/2011   Health Maintenance Due  Topic Date Due  . URINE MICROALBUMIN  03/27/1936  . INFLUENZA VACCINE  08/01/2015  . OPHTHALMOLOGY EXAM  09/08/2015  . PNA vac Low Risk Adult (2 of 2 - PPSV23) 09/15/2015    Patient Care Team: Mosie Lukes, MD as PCP - General  (Family Medicine) Marica Otter, [place] as Consulting Physician (Optometry) Garlan Fair, MD as Consulting Physician (Gastroenterology)  Indicate any recent Medical Services you may have received from other than [facility] providers in the past year (date may be approximate).   Assessment:   This  is a routine wellness examination for Dana Burns.   DMII- controlled with medication.  Last A1c-07/26/15-6.4.  BS at home have been running 118, 123 fasting per patient.   Diabetic neuropathy- controlled with gabapentin.    Hypertension- bp stable.  Controlled with medication.  Encouraged heart healthy diet and exercise as tolerated.    Hyperlipidemia- Last Lipid panel- 07/26/15- LDL slightly elevated.  Encouraged heart healthy diet and exercise as tolerated.      Hearing/Vision screen  Hearing Screening   125Hz  250Hz  500Hz  1000Hz  2000Hz  4000Hz  8000Hz   Right ear:     100    Left ear:     100    Comments: Slightly hard of hearing. Able to hear conversational tones from 2 feet able.    Vision Screening Comments: DUE for eye exam.  Plans to schedule. No changes noted in vision.    Dietary issues and exercise activities discussed: Current Exercise Habits:: The patient does not participate in regular exercise at present   Diet: Eat cereal.  Cabbage, pinto beans, creamed potatoes, peas, corn, doesn't eat much meat.  Eats fruit-peaches bananas, cream cheese, slaw, squash, tomatoes. Coffee-cream, no sugar.  Eat dessert everyday.  Eats a lot of bread.    Goals    . Cut back on eating desserts to 3-4 times per week.      . Increase physical activity     Walking and chair exercises.        Depression Screen PHQ 2/9 Scores 09/22/2015 04/19/2015 03/30/2014  PHQ - 2 Score 1 0 0    Fall Risk Fall Risk  09/22/2015 04/19/2015 03/30/2014  Falls in the past year? No Yes No  Number falls in past yr: - 1 -  Injury with Fall? - No -  Risk for fall due to : History of fall(s);Impaired mobility;Impaired  vision;Impaired balance/gait - -  Risk for fall due to (comments): Walks with cane//has fear of falling - -    Cognitive Function: MMSE - Mini Mental State Exam 09/22/2015  Orientation to time 5  Orientation to Place 5  Registration 3  Attention/ Calculation 5  Recall 3  Language- name 2 objects 2  Language- repeat 1  Language- follow 3 step command 3  Language- read & follow direction 1  Write a sentence 1  Copy design 1  Total score 30    Screening Tests Health Maintenance  Topic Date Due  . URINE MICROALBUMIN  03/27/1936  . INFLUENZA VACCINE  08/01/2015  . OPHTHALMOLOGY EXAM  09/08/2015  . PNA vac Low Risk Adult (2 of 2 - PPSV23) 09/15/2015  . HEMOGLOBIN A1C  01/26/2016  . FOOT EXAM  09/21/2016  . COLONOSCOPY  07/31/2018  . TETANUS/TDAP  10/16/2021  . DEXA SCAN  Completed  . ZOSTAVAX  Addressed      Plan:   Schedule eye exam.  Schedule to receive Pneumonia 23 and Flu vaccine.  Increase physical activity slowly (walks around neighborhood and chair exercises as tolerated).    Limit the number of sweets and breads consumed daily.   Follow with Dr. Charlett Blake as scheduled.    During the course of the visit, Dana Burns was educated and counseled about the following appropriate screening and preventive services:   Vaccines to include Pneumoccal, Influenza, Hepatitis B, Td, Zostavax, HCV  Electrocardiogram  Cardiovascular disease screening  Colorectal cancer screening  Bone density screening  Diabetes screening  Glaucoma screening  Mammography/PAP  Nutrition counseling  Smoking cessation counseling  Patient Instructions (the written plan) were  given to the patient.    Rudene Anda, RN   09/22/2015

## 2015-09-22 NOTE — Progress Notes (Signed)
Reviewed AWV documentation

## 2015-09-29 DIAGNOSIS — E119 Type 2 diabetes mellitus without complications: Secondary | ICD-10-CM | POA: Diagnosis not present

## 2015-10-04 DIAGNOSIS — E119 Type 2 diabetes mellitus without complications: Secondary | ICD-10-CM | POA: Diagnosis not present

## 2015-10-04 LAB — HM DIABETES EYE EXAM

## 2015-10-12 ENCOUNTER — Encounter: Payer: Self-pay | Admitting: Family Medicine

## 2015-10-16 ENCOUNTER — Other Ambulatory Visit: Payer: Self-pay | Admitting: Family Medicine

## 2015-10-27 ENCOUNTER — Encounter: Payer: Self-pay | Admitting: Family Medicine

## 2015-10-27 ENCOUNTER — Ambulatory Visit (INDEPENDENT_AMBULATORY_CARE_PROVIDER_SITE_OTHER): Payer: Medicare Other | Admitting: Family Medicine

## 2015-10-27 VITALS — BP 152/90 | HR 84 | Temp 97.9°F | Ht 66.0 in | Wt 145.2 lb

## 2015-10-27 DIAGNOSIS — E1142 Type 2 diabetes mellitus with diabetic polyneuropathy: Secondary | ICD-10-CM | POA: Diagnosis not present

## 2015-10-27 DIAGNOSIS — E782 Mixed hyperlipidemia: Secondary | ICD-10-CM

## 2015-10-27 DIAGNOSIS — N189 Chronic kidney disease, unspecified: Secondary | ICD-10-CM

## 2015-10-27 DIAGNOSIS — E538 Deficiency of other specified B group vitamins: Secondary | ICD-10-CM

## 2015-10-27 DIAGNOSIS — Z23 Encounter for immunization: Secondary | ICD-10-CM

## 2015-10-27 DIAGNOSIS — K219 Gastro-esophageal reflux disease without esophagitis: Secondary | ICD-10-CM

## 2015-10-27 DIAGNOSIS — G629 Polyneuropathy, unspecified: Secondary | ICD-10-CM

## 2015-10-27 DIAGNOSIS — R000 Tachycardia, unspecified: Secondary | ICD-10-CM

## 2015-10-27 DIAGNOSIS — E785 Hyperlipidemia, unspecified: Secondary | ICD-10-CM

## 2015-10-27 DIAGNOSIS — I10 Essential (primary) hypertension: Secondary | ICD-10-CM

## 2015-10-27 LAB — HEMOGLOBIN A1C: Hgb A1c MFr Bld: 6.5 % (ref 4.6–6.5)

## 2015-10-27 LAB — LIPID PANEL
CHOL/HDL RATIO: 4
CHOLESTEROL: 183 mg/dL (ref 0–200)
HDL: 44.1 mg/dL (ref >39.00)
LDL Cholesterol: 111 mg/dL — ABNORMAL HIGH (ref 0–99)
NonHDL: 138.49
TRIGLYCERIDES: 139 mg/dL (ref 0.0–149.0)
VLDL: 27.8 mg/dL (ref 0.0–40.0)

## 2015-10-27 LAB — COMPREHENSIVE METABOLIC PANEL
ALBUMIN: 4 g/dL (ref 3.5–5.2)
ALK PHOS: 61 U/L (ref 39–117)
ALT: 9 U/L (ref 0–35)
AST: 17 U/L (ref 0–37)
BILIRUBIN TOTAL: 0.4 mg/dL (ref 0.2–1.2)
BUN: 16 mg/dL (ref 6–23)
CALCIUM: 9.9 mg/dL (ref 8.4–10.5)
CO2: 32 meq/L (ref 19–32)
CREATININE: 1.18 mg/dL (ref 0.40–1.20)
Chloride: 105 mEq/L (ref 96–112)
GFR: 45.99 mL/min — AB (ref >60.00)
Glucose, Bld: 102 mg/dL — ABNORMAL HIGH (ref 70–99)
Potassium: 4.8 mEq/L (ref 3.5–5.1)
Sodium: 141 mEq/L (ref 135–145)
Total Protein: 7 g/dL (ref 6.0–8.3)

## 2015-10-27 LAB — TSH: TSH: 2.74 u[IU]/mL (ref 0.35–4.50)

## 2015-10-27 LAB — CBC
HCT: 36.4 % (ref 36.0–46.0)
Hemoglobin: 11.6 g/dL — ABNORMAL LOW (ref 12.0–15.0)
MCHC: 32 g/dL (ref 30.0–36.0)
MCV: 82.3 fl (ref 78.0–100.0)
PLATELETS: 255 10*3/uL (ref 150.0–400.0)
RBC: 4.42 Mil/uL (ref 3.87–5.11)
RDW: 16.9 % — ABNORMAL HIGH (ref 11.5–15.5)
WBC: 6.2 10*3/uL (ref 4.0–10.5)

## 2015-10-27 LAB — MICROALBUMIN / CREATININE URINE RATIO
CREATININE, U: 73.8 mg/dL
MICROALB/CREAT RATIO: 1.4 mg/g (ref 0.0–30.0)
Microalb, Ur: 1 mg/dL (ref 0.0–1.9)

## 2015-10-27 LAB — VITAMIN D 25 HYDROXY (VIT D DEFICIENCY, FRACTURES): VITD: 82.84 ng/mL (ref 30.00–100.00)

## 2015-10-27 LAB — VITAMIN B12: VITAMIN B 12: 562 pg/mL (ref 211–911)

## 2015-10-27 MED ORDER — HYDROCODONE-ACETAMINOPHEN 5-325 MG PO TABS: 1.0000 | ORAL_TABLET | Freq: Four times a day (QID) | ORAL | Status: DC | PRN

## 2015-10-27 NOTE — Patient Instructions (Signed)

## 2015-10-27 NOTE — Progress Notes (Signed)
Pre visit review using our clinic review tool, if applicable. No additional management support is needed unless otherwise documented below in the visit note. 

## 2015-11-01 ENCOUNTER — Ambulatory Visit: Payer: Medicare Other

## 2015-11-01 ENCOUNTER — Encounter: Payer: Self-pay | Admitting: Physician Assistant

## 2015-11-01 ENCOUNTER — Ambulatory Visit (INDEPENDENT_AMBULATORY_CARE_PROVIDER_SITE_OTHER): Payer: Medicare Other | Admitting: Physician Assistant

## 2015-11-01 VITALS — BP 128/62 | HR 74 | Temp 98.0°F | Resp 16 | Ht 66.0 in | Wt 143.1 lb

## 2015-11-01 DIAGNOSIS — B9689 Other specified bacterial agents as the cause of diseases classified elsewhere: Secondary | ICD-10-CM | POA: Insufficient documentation

## 2015-11-01 DIAGNOSIS — J208 Acute bronchitis due to other specified organisms: Principal | ICD-10-CM

## 2015-11-01 DIAGNOSIS — J00 Acute nasopharyngitis [common cold]: Secondary | ICD-10-CM | POA: Diagnosis not present

## 2015-11-01 MED ORDER — DOXYCYCLINE HYCLATE 100 MG PO CAPS: 100.0000 mg | ORAL_CAPSULE | Freq: Two times a day (BID) | ORAL | Status: DC

## 2015-11-01 MED ORDER — DEXLANSOPRAZOLE 30 MG PO CPDR
30.0000 mg | DELAYED_RELEASE_CAPSULE | Freq: Every day | ORAL | Status: DC
Start: 2015-11-01 — End: 2016-02-07

## 2015-11-01 NOTE — Assessment & Plan Note (Signed)
Rx Doxycycline.  Increase fluids.  Rest.  Saline nasal spray.  Probiotic.  Mucinex as directed.  Humidifier in bedroom.  Call or return to clinic if symptoms are not improving.  

## 2015-11-01 NOTE — Progress Notes (Signed)
Patient presents to clinic today c/o 5 days of sore throat, headache, productive cough of yellow/green sputum that has been worsening every day since onset of symptoms. Denies fever, chest pain, shortness of breath. Endorses some mild generalized aches and pain. Has not taken anything for symptoms. Denies recent travel or sick contact.  Past Medical History  Diagnosis Date  . Diabetes mellitus     6 years  . Peripheral neuropathy ([place])   . Fibromyalgia   . Colon cancer ([place])   . Complication of anesthesia     agitated when waking up, wakes up shaking, "like  I'm going into shock"  . Hypertension     15 years, reports she has a rapid heartrate   . Depression     husband died  3 months ago, pt. admits depression  currently   . Shortness of breath   . GERD (gastroesophageal reflux disease)   . Headache(784.0)     occasional - "bad headaches"  . Osteoarthritis     in hands & all over, feet  . H/O echocardiogram     done /w [place] in 2011, saw Dr. Percival Spanish for tachycardia   . Hyperlipidemia     8 years  . Chicken pox as a child  . Measles as a child  . Allergy     sneeze, rhinorrhea in am  . Pain of right shoulder region 07/07/2014  . DM (diabetes mellitus) type 2, uncontrolled, with ketoacidosis ([place]) 03/17/2011  . Chronic pain syndrome 01/23/2015  . Type 2 diabetes mellitus with peripheral neuropathy ([place]) 03/17/2011    Current Outpatient Prescriptions on File Prior to Visit  Medication Sig Dispense Refill  . acetaminophen (TYLENOL) 500 MG tablet Take 1,000 mg by mouth 2 (two) times daily.    . calcium carbonate (OS-CAL) 600 MG TABS Take 600 mg by mouth 2 (two) times daily with a meal.      . Cholecalciferol (VITAMIN D3) 5000 UNITS CAPS Take 5,000 Units by mouth daily.    . citalopram (CELEXA) 10 MG tablet TAKE 1 TABLET (10 MG TOTAL) BY MOUTH DAILY. 30 tablet 2  . diazepam (VALIUM) 5 MG tablet TAKE 1 AND A HALF TABLETS BY MOUTH TWICE A DAY AS NEEDED 45 tablet 1  . gabapentin  (NEURONTIN) 800 MG tablet TAKE 1 TABLET (800 MG TOTAL) BY MOUTH 3 (THREE) TIMES DAILY. 90 tablet 1  . HYDROcodone-acetaminophen (NORCO/VICODIN) 5-325 MG tablet Take 1 tablet by mouth every 6 (six) hours as needed for moderate pain. 40 tablet 0  . metFORMIN (GLUCOPHAGE) 500 MG tablet TAKE 1 TABLET (500 MG TOTAL) BY MOUTH DAILY BEFORE BREAKFAST. 30 tablet 6  . pravastatin (PRAVACHOL) 40 MG tablet TAKE 1 TABLET (40 MG TOTAL) BY MOUTH AT BEDTIME. 30 tablet 4  . valsartan-hydrochlorothiazide (DIOVAN-HCT) 160-12.5 MG per tablet Take 1 tablet by mouth daily. 30 tablet 3   No current facility-administered medications on file prior to visit.    Allergies  Allergen Reactions  . Tape Itching and Rash  . Actos [Pioglitazone Hydrochloride]   . Buspar [Buspirone Hcl]   . Lipitor [Atorvastatin Calcium]   . Penicillins Rash    Family History  Problem Relation Age of Onset  . Cancer Mother     Brain  . Cancer Father     Colorectal  . Heart disease Neg Hx     Early  . Cancer Sister     breast  . Other Brother     pacemaker  . Arthritis Sister   .  Emphysema Brother   . Cancer Brother   . COPD Brother   . Neuropathy Daughter   . Fibromyalgia Daughter     Social History   Social History  . Marital Status: Widowed    Spouse Name: N/A  . Number of Children: 1  . Years of Education: 9   Occupational History  . Retired    Social History Main Topics  . Smoking status: Never Smoker   . Smokeless tobacco: Never Used  . Alcohol Use: No  . Drug Use: No  . Sexual Activity: No     Comment: lives with daughter. no dietary restrictions   Other Topics Concern  . None   Social History Narrative   Patient lives at home with daughter.    Patient is retired.    Patient is widowed.    Patient has 1 child.    Patient has a 9th grade education.     Review of Systems - See HPI.  All other ROS are negative.  BP 128/62 mmHg  Pulse 74  Temp(Src) 98 F (36.7 C) (Oral)  Resp 16  Ht 5\' 6"   (1.676 m)  Wt 143 lb 2 oz (64.921 kg)  BMI 23.11 kg/m2  SpO2 100%  Physical Exam  Constitutional: She is oriented to person, place, and time and well-developed, well-nourished, and in no distress.  HENT:  Head: Normocephalic and atraumatic.  Right Ear: External ear normal.  Left Ear: External ear normal.  Nose: Nose normal.  Mouth/Throat: Oropharynx is clear and moist. No oropharyngeal exudate.  TM within normal limits bilaterally.  Eyes: Conjunctivae are normal.  Neck: Neck supple.  Cardiovascular: Normal rate, regular rhythm, normal heart sounds and intact distal pulses.   Pulmonary/Chest: Effort normal and breath sounds normal. No respiratory distress. She has no wheezes. She has no rales. She exhibits no tenderness.  Neurological: She is alert and oriented to person, place, and time.  Skin: Skin is warm and dry. No rash noted.  Psychiatric: Affect normal.  Vitals reviewed.   Recent Results (from the past 2160 hour(s))  [facility] DIABETES EYE EXAM     Status: None   Collection Time: 10/04/15 12:00 AM  Result Value Ref Range   [facility] Diabetic Eye Exam No Retinopathy No Retinopathy    Comment: Susanne Greenhouse OD PA,  Vision within normal limits  TSH     Status: None   Collection Time: 10/27/15 12:36 PM  Result Value Ref Range   TSH 2.74 0.35 - 4.50 uIU/mL  CBC     Status: Abnormal   Collection Time: 10/27/15 12:36 PM  Result Value Ref Range   WBC 6.2 4.0 - 10.5 K/uL   RBC 4.42 3.87 - 5.11 Mil/uL   Platelets 255.0 150.0 - 400.0 K/uL   Hemoglobin 11.6 (L) 12.0 - 15.0 g/dL   HCT 36.4 36.0 - 46.0 %   MCV 82.3 78.0 - 100.0 fl   MCHC 32.0 30.0 - 36.0 g/dL   RDW 16.9 (H) 11.5 - 15.5 %  Vit D  25 hydroxy (rtn osteoporosis monitoring)     Status: None   Collection Time: 10/27/15 12:36 PM  Result Value Ref Range   VITD 82.84 30.00 - 100.00 ng/mL  Microalbumin / creatinine urine ratio     Status: None   Collection Time: 10/27/15 12:36 PM  Result Value Ref Range   Microalb, Ur 1.0 0.0 -  1.9 mg/dL   Creatinine,U 73.8 mg/dL   Microalb Creat Ratio 1.4 0.0 - 30.0 mg/g  Hemoglobin A1c     Status: None   Collection Time: 10/27/15 12:36 PM  Result Value Ref Range   Hgb A1c MFr Bld 6.5 4.6 - 6.5 %    Comment: Glycemic Control Guidelines for People with Diabetes:Non Diabetic:  <6%Goal of Therapy: <7%Additional Action Suggested:  >8%   Lipid panel     Status: Abnormal   Collection Time: 10/27/15 12:36 PM  Result Value Ref Range   Cholesterol 183 0 - 200 mg/dL    Comment: ATP III Classification       Desirable:  < 200 mg/dL               Borderline High:  200 - 239 mg/dL          High:  > = 240 mg/dL   Triglycerides 139.0 0.0 - 149.0 mg/dL    Comment: Normal:  <150 mg/dLBorderline High:  150 - 199 mg/dL   HDL 44.10 >39.00 mg/dL   VLDL 27.8 0.0 - 40.0 mg/dL   LDL Cholesterol 111 (H) 0 - 99 mg/dL   Total CHOL/HDL Ratio 4     Comment:                Men          Women1/2 Average Risk     3.4          3.3Average Risk          5.0          4.42X Average Risk          9.6          7.13X Average Risk          15.0          11.0                       NonHDL 138.49     Comment: NOTE:  Non-HDL goal should be 30 mg/dL higher than patient's LDL goal (i.e. LDL goal of < 70 mg/dL, would have non-HDL goal of < 100 mg/dL)  Comprehensive metabolic panel     Status: Abnormal   Collection Time: 10/27/15 12:36 PM  Result Value Ref Range   Sodium 141 135 - 145 mEq/L   Potassium 4.8 3.5 - 5.1 mEq/L   Chloride 105 96 - 112 mEq/L   CO2 32 19 - 32 mEq/L   Glucose, Bld 102 (H) 70 - 99 mg/dL   BUN 16 6 - 23 mg/dL   Creatinine, Ser 1.18 0.40 - 1.20 mg/dL   Total Bilirubin 0.4 0.2 - 1.2 mg/dL   Alkaline Phosphatase 61 39 - 117 U/L   AST 17 0 - 37 U/L   ALT 9 0 - 35 U/L   Total Protein 7.0 6.0 - 8.3 g/dL   Albumin 4.0 3.5 - 5.2 g/dL   Calcium 9.9 8.4 - 10.5 mg/dL   GFR 45.99 (L) >60.00 mL/min  Vitamin B12     Status: None   Collection Time: 10/27/15 12:36 PM  Result Value Ref Range   Vitamin B-12  562 211 - 911 pg/mL    Assessment/Plan: Acute bacterial bronchitis Rx Doxycycline.  Increase fluids.  Rest.  Saline nasal spray.  Probiotic.  Mucinex as directed.  Humidifier in bedroom.  Call or return to clinic if symptoms are not improving.

## 2015-11-01 NOTE — Patient Instructions (Signed)
Take antibiotic (Doxycycline) as directed.  Increase fluids.  Get plenty of rest. Use Mucinex-DM as directed. Take a daily probiotic (I recommend Align or Culturelle, but even Activia Yogurt may be beneficial).  A humidifier placed in the bedroom may offer some relief for a dry, scratchy throat of nasal irritation.  Read information below on acute bronchitis. Please call or return to clinic if symptoms are not improving.  Stop the Protonix and begin the Dexilant daily as directed. Let me know if acid reflux symptoms are not resolving.  Acute Bronchitis Bronchitis is when the airways that extend from the windpipe into the lungs get red, puffy, and painful (inflamed). Bronchitis often causes thick spit (mucus) to develop. This leads to a cough. A cough is the most common symptom of bronchitis. In acute bronchitis, the condition usually begins suddenly and goes away over time (usually in 2 weeks). Smoking, allergies, and asthma can make bronchitis worse. Repeated episodes of bronchitis may cause more lung problems.  HOME CARE  Rest.  Drink enough fluids to keep your pee (urine) clear or pale yellow (unless you need to limit fluids as told by your doctor).  Only take over-the-counter or prescription medicines as told by your doctor.  Avoid smoking and secondhand smoke. These can make bronchitis worse. If you are a smoker, think about using nicotine gum or skin patches. Quitting smoking will help your lungs heal faster.  Reduce the chance of getting bronchitis again by:  Washing your hands often.  Avoiding people with cold symptoms.  Trying not to touch your hands to your mouth, nose, or eyes.  Follow up with your doctor as told.  GET HELP IF: Your symptoms do not improve after 1 week of treatment. Symptoms include:  Cough.  Fever.  Coughing up thick spit.  Body aches.  Chest congestion.  Chills.  Shortness of breath.  Sore throat.  GET HELP RIGHT AWAY IF:   You have an  increased fever.  You have chills.  You have severe shortness of breath.  You have bloody thick spit (sputum).  You throw up (vomit) often.  You lose too much body fluid (dehydration).  You have a severe headache.  You faint.  MAKE SURE YOU:   Understand these instructions.  Will watch your condition.  Will get help right away if you are not doing well or get worse. Document Released: 06/04/2008 Document Revised: 08/19/2013 Document Reviewed: 06/09/2013 [facility] Patient Information 2015 [place], [place]. This information is not intended to replace advice given to you by your health care provider. Make sure you discuss any questions you have with your health care provider.

## 2015-11-06 ENCOUNTER — Other Ambulatory Visit: Payer: Self-pay | Admitting: Family Medicine

## 2015-11-06 NOTE — Assessment & Plan Note (Signed)
Well controlled, no changes to meds. Encouraged heart healthy diet such as the DASH diet and exercise as tolerated.  °

## 2015-11-06 NOTE — Assessment & Plan Note (Signed)
Continue supplements

## 2015-11-06 NOTE — Assessment & Plan Note (Signed)
Stable

## 2015-11-06 NOTE — Progress Notes (Signed)
Subjective:    Patient ID: Dana Burns, female    DOB: 08-09-1926, 81 y.o.   MRN: [id number]  Chief Complaint  Patient presents with  . Follow-up    3 month    HPI Patient is in today for follow-up. She is complaining of not feeling well but is troubled trying to be specific. She says she's been weaker than usual lately slightly more short of breath. She denies any fevers, chills, cough. Continues to struggle with neuropathy and has had a couple falls recently. Denies CP/palp/HA/congestion/fevers/GI or GU c/o. Taking meds as prescribed  Past Medical History  Diagnosis Date  . Diabetes mellitus     6 years  . Peripheral neuropathy ([place])   . Fibromyalgia   . Colon cancer ([place])   . Complication of anesthesia     agitated when waking up, wakes up shaking, "like  I'm going into shock"  . Hypertension     15 years, reports she has a rapid heartrate   . Depression     husband died  3 months ago, pt. admits depression  currently   . Shortness of breath   . GERD (gastroesophageal reflux disease)   . Headache(784.0)     occasional - "bad headaches"  . Osteoarthritis     in hands & all over, feet  . H/O echocardiogram     done /w [place] in 2011, saw Dr. Percival Spanish for tachycardia   . Hyperlipidemia     6 years  . Chicken pox as a child  . Measles as a child  . Allergy     sneeze, rhinorrhea in am  . Pain of right shoulder region 07/07/2014  . DM (diabetes mellitus) type 2, uncontrolled, with ketoacidosis ([place]) 03/17/2011  . Chronic pain syndrome 01/23/2015  . Type 2 diabetes mellitus with peripheral neuropathy ([place]) 03/17/2011    Past Surgical History  Procedure Laterality Date  . Cataract extraction      /w IOL- bilateral   . Colon surgery      For resection of cancer  . Nodule removed      From throat  . Breast surgery  2013    right- benign    Family History  Problem Relation Age of Onset  . Cancer Mother     Brain  . Cancer Father     Colorectal  . Heart disease  Neg Hx     Early  . Cancer Sister     breast  . Other Brother     pacemaker  . Arthritis Sister   . Emphysema Brother   . Cancer Brother   . COPD Brother   . Neuropathy Daughter   . Fibromyalgia Daughter     Social History   Social History  . Marital Status: Widowed    Spouse Name: N/A  . Number of Children: 1  . Years of Education: 9   Occupational History  . Retired    Social History Main Topics  . Smoking status: Never Smoker   . Smokeless tobacco: Never Used  . Alcohol Use: No  . Drug Use: No  . Sexual Activity: No     Comment: lives with daughter. no dietary restrictions   Other Topics Concern  . Not on file   Social History Narrative   Patient lives at home with daughter.    Patient is retired.    Patient is widowed.    Patient has 1 child.    Patient has a 9th grade education.  Outpatient Prescriptions Prior to Visit  Medication Sig Dispense Refill  . acetaminophen (TYLENOL) 500 MG tablet Take 1,000 mg by mouth 2 (two) times daily.    . calcium carbonate (OS-CAL) 600 MG TABS Take 600 mg by mouth 2 (two) times daily with a meal.      . Cholecalciferol (VITAMIN D3) 5000 UNITS CAPS Take 5,000 Units by mouth daily.    . citalopram (CELEXA) 10 MG tablet TAKE 1 TABLET (10 MG TOTAL) BY MOUTH DAILY. 30 tablet 2  . diazepam (VALIUM) 5 MG tablet TAKE 1 AND A HALF TABLETS BY MOUTH TWICE A DAY AS NEEDED 45 tablet 1  . gabapentin (NEURONTIN) 800 MG tablet TAKE 1 TABLET (800 MG TOTAL) BY MOUTH 3 (THREE) TIMES DAILY. 90 tablet 1  . metFORMIN (GLUCOPHAGE) 500 MG tablet TAKE 1 TABLET (500 MG TOTAL) BY MOUTH DAILY BEFORE BREAKFAST. 30 tablet 6  . pravastatin (PRAVACHOL) 40 MG tablet TAKE 1 TABLET (40 MG TOTAL) BY MOUTH AT BEDTIME. 30 tablet 4  . HYDROcodone-acetaminophen (NORCO/VICODIN) 5-325 MG per tablet Take 1 tablet by mouth every 6 (six) hours as needed for moderate pain. 40 tablet 0  . Omega-3 Fatty Acids (FISH OIL) 1200 MG CAPS Take 1,200 mg by mouth 2 (two)  times daily.      . pantoprazole (PROTONIX) 40 MG tablet Take 1 tablet (40 mg total) by mouth daily. 30 tablet 5  . valsartan-hydrochlorothiazide (DIOVAN-HCT) 160-12.5 MG per tablet Take 1 tablet by mouth daily. 30 tablet 3   No facility-administered medications prior to visit.    Allergies  Allergen Reactions  . Tape Itching and Rash  . Actos [Pioglitazone Hydrochloride]   . Buspar [Buspirone Hcl]   . Lipitor [Atorvastatin Calcium]   . Penicillins Rash    Review of Systems  Constitutional: Positive for malaise/fatigue. Negative for fever.  HENT: Negative for congestion.   Eyes: Negative for discharge.  Respiratory: Positive for shortness of breath.   Cardiovascular: Negative for chest pain, palpitations and leg swelling.  Gastrointestinal: Negative for nausea and abdominal pain.  Genitourinary: Negative for dysuria.  Musculoskeletal: Negative for falls.  Skin: Negative for rash.  Neurological: Positive for weakness. Negative for loss of consciousness and headaches.  Endo/Heme/Allergies: Negative for environmental allergies.  Psychiatric/Behavioral: Negative for depression. The patient is not nervous/anxious.        Objective:    Physical Exam  Constitutional: She is oriented to person, place, and time. She appears well-developed and well-nourished. No distress.  HENT:  Head: Normocephalic and atraumatic.  Nose: Nose normal.  Eyes: Right eye exhibits no discharge. Left eye exhibits no discharge.  Neck: Normal range of motion. Neck supple.  Cardiovascular: Normal rate and regular rhythm.   Pulmonary/Chest: Effort normal and breath sounds normal.  Abdominal: Soft. Bowel sounds are normal. There is no tenderness.  Musculoskeletal: She exhibits no edema.  Neurological: She is alert and oriented to person, place, and time.  Skin: Skin is warm and dry.  Psychiatric: She has a normal mood and affect.  Nursing note and vitals reviewed.   BP 152/90 mmHg  Pulse 84   Temp(Src) 97.9 F (36.6 C) (Oral)  Ht 5\' 6"  (1.676 m)  Wt 145 lb 4 oz (65.885 kg)  BMI 23.46 kg/m2  SpO2 95% Wt Readings from Last 3 Encounters:  11/01/15 143 lb 2 oz (64.921 kg)  10/27/15 145 lb 4 oz (65.885 kg)  09/22/15 142 lb 12.8 oz (64.774 kg)     Lab Results  Component Value Date  WBC 6.2 10/27/2015   HGB 11.6* 10/27/2015   HCT 36.4 10/27/2015   PLT 255.0 10/27/2015   GLUCOSE 102* 10/27/2015   CHOL 183 10/27/2015   TRIG 139.0 10/27/2015   HDL 44.10 10/27/2015   LDLCALC 111* 10/27/2015   ALT 9 10/27/2015   AST 17 10/27/2015   NA 141 10/27/2015   K 4.8 10/27/2015   CL 105 10/27/2015   CREATININE 1.18 10/27/2015   BUN 16 10/27/2015   CO2 32 10/27/2015   TSH 2.74 10/27/2015   HGBA1C 6.5 10/27/2015   MICROALBUR 1.0 10/27/2015    Lab Results  Component Value Date   TSH 2.74 10/27/2015   Lab Results  Component Value Date   WBC 6.2 10/27/2015   HGB 11.6* 10/27/2015   HCT 36.4 10/27/2015   MCV 82.3 10/27/2015   PLT 255.0 10/27/2015   Lab Results  Component Value Date   NA 141 10/27/2015   K 4.8 10/27/2015   CO2 32 10/27/2015   GLUCOSE 102* 10/27/2015   BUN 16 10/27/2015   CREATININE 1.18 10/27/2015   BILITOT 0.4 10/27/2015   ALKPHOS 61 10/27/2015   AST 17 10/27/2015   ALT 9 10/27/2015   PROT 7.0 10/27/2015   ALBUMIN 4.0 10/27/2015   CALCIUM 9.9 10/27/2015   GFR 45.99* 10/27/2015   Lab Results  Component Value Date   CHOL 183 10/27/2015   Lab Results  Component Value Date   HDL 44.10 10/27/2015   Lab Results  Component Value Date   LDLCALC 111* 10/27/2015   Lab Results  Component Value Date   TRIG 139.0 10/27/2015   Lab Results  Component Value Date   CHOLHDL 4 10/27/2015   Lab Results  Component Value Date   HGBA1C 6.5 10/27/2015       Assessment & Plan:   Problem List Items Addressed This Visit    Vitamin B 12 deficiency    Continue supplements      Relevant Medications   HYDROcodone-acetaminophen (NORCO/VICODIN)  5-325 MG tablet   Other Relevant Orders   TSH (Completed)   CBC (Completed)   Vit D  25 hydroxy (rtn osteoporosis monitoring) (Completed)   Microalbumin / creatinine urine ratio (Completed)   Hemoglobin A1c (Completed)   Lipid panel (Completed)   Comprehensive metabolic panel (Completed)   Vitamin B12 (Completed)   Type 2 diabetes mellitus with peripheral neuropathy (HCC)    hgba1c acceptable, minimize simple carbs. Increase exercise as tolerated. Continue current meds      Relevant Medications   HYDROcodone-acetaminophen (NORCO/VICODIN) 5-325 MG tablet   Other Relevant Orders   TSH (Completed)   CBC (Completed)   Vit D  25 hydroxy (rtn osteoporosis monitoring) (Completed)   Microalbumin / creatinine urine ratio (Completed)   Hemoglobin A1c (Completed)   Lipid panel (Completed)   Comprehensive metabolic panel (Completed)   Vitamin B12 (Completed)   Tachycardia    RRR today      Relevant Medications   HYDROcodone-acetaminophen (NORCO/VICODIN) 5-325 MG tablet   Other Relevant Orders   TSH (Completed)   CBC (Completed)   Vit D  25 hydroxy (rtn osteoporosis monitoring) (Completed)   Microalbumin / creatinine urine ratio (Completed)   Hemoglobin A1c (Completed)   Lipid panel (Completed)   Comprehensive metabolic panel (Completed)   Vitamin B12 (Completed)   Hyperlipidemia   Relevant Medications   HYDROcodone-acetaminophen (NORCO/VICODIN) 5-325 MG tablet   Other Relevant Orders   TSH (Completed)   CBC (Completed)   Vit D  25 hydroxy (rtn  osteoporosis monitoring) (Completed)   Microalbumin / creatinine urine ratio (Completed)   Hemoglobin A1c (Completed)   Lipid panel (Completed)   Comprehensive metabolic panel (Completed)   Vitamin B12 (Completed)   GERD (gastroesophageal reflux disease)   Relevant Medications   HYDROcodone-acetaminophen (NORCO/VICODIN) 5-325 MG tablet   Other Relevant Orders   TSH (Completed)   CBC (Completed)   Vit D  25 hydroxy (rtn osteoporosis  monitoring) (Completed)   Microalbumin / creatinine urine ratio (Completed)   Hemoglobin A1c (Completed)   Lipid panel (Completed)   Comprehensive metabolic panel (Completed)   Vitamin B12 (Completed)   Essential hypertension, benign    Well controlled, no changes to meds. Encouraged heart healthy diet such as the DASH diet and exercise as tolerated.       Relevant Medications   HYDROcodone-acetaminophen (NORCO/VICODIN) 5-325 MG tablet   Other Relevant Orders   TSH (Completed)   CBC (Completed)   Vit D  25 hydroxy (rtn osteoporosis monitoring) (Completed)   Microalbumin / creatinine urine ratio (Completed)   Hemoglobin A1c (Completed)   Lipid panel (Completed)   Comprehensive metabolic panel (Completed)   Vitamin B12 (Completed)   CKD (chronic kidney disease)    Stable       Relevant Medications   HYDROcodone-acetaminophen (NORCO/VICODIN) 5-325 MG tablet   Other Relevant Orders   TSH (Completed)   CBC (Completed)   Vit D  25 hydroxy (rtn osteoporosis monitoring) (Completed)   Microalbumin / creatinine urine ratio (Completed)   Hemoglobin A1c (Completed)   Lipid panel (Completed)   Comprehensive metabolic panel (Completed)   Vitamin B12 (Completed)    Other Visit Diagnoses    Neuropathy ([place])    -  Primary    Relevant Medications    HYDROcodone-acetaminophen (NORCO/VICODIN) 5-325 MG tablet    Other Relevant Orders    TSH (Completed)    CBC (Completed)    Vit D  25 hydroxy (rtn osteoporosis monitoring) (Completed)    Microalbumin / creatinine urine ratio (Completed)    Hemoglobin A1c (Completed)    Lipid panel (Completed)    Comprehensive metabolic panel (Completed)    Vitamin B12 (Completed)    Hyperlipidemia, mixed        Relevant Medications    HYDROcodone-acetaminophen (NORCO/VICODIN) 5-325 MG tablet    Other Relevant Orders    TSH (Completed)    CBC (Completed)    Vit D  25 hydroxy (rtn osteoporosis monitoring) (Completed)    Microalbumin / creatinine urine  ratio (Completed)    Hemoglobin A1c (Completed)    Lipid panel (Completed)    Comprehensive metabolic panel (Completed)    Vitamin B12 (Completed)    Vitamin B12 deficiency        Relevant Medications    HYDROcodone-acetaminophen (NORCO/VICODIN) 5-325 MG tablet    Other Relevant Orders    TSH (Completed)    CBC (Completed)    Vit D  25 hydroxy (rtn osteoporosis monitoring) (Completed)    Microalbumin / creatinine urine ratio (Completed)    Hemoglobin A1c (Completed)    Lipid panel (Completed)    Comprehensive metabolic panel (Completed)    Vitamin B12 (Completed)    Need for 23-polyvalent pneumococcal polysaccharide vaccine        Relevant Orders    Pneumococcal polysaccharide vaccine 23-valent greater than or equal to 2yo subcutaneous/IM (Completed)       I have discontinued Ms. Zellner's Fish Oil. I have also changed her HYDROcodone-acetaminophen. Additionally, I am having her maintain her calcium carbonate, Vitamin  D3, acetaminophen, metFORMIN, gabapentin, citalopram, diazepam, and pravastatin.  Meds ordered this encounter  Medications  . HYDROcodone-acetaminophen (NORCO/VICODIN) 5-325 MG tablet    Sig: Take 1 tablet by mouth every 6 (six) hours as needed for moderate pain.    Dispense:  40 tablet    Refill:  0     Penni Homans, MD

## 2015-11-06 NOTE — Assessment & Plan Note (Signed)
hgba1c acceptable, minimize simple carbs. Increase exercise as tolerated. Continue current meds 

## 2015-11-06 NOTE — Assessment & Plan Note (Signed)
RRR today 

## 2015-11-13 ENCOUNTER — Other Ambulatory Visit: Payer: Self-pay | Admitting: Family Medicine

## 2015-11-15 ENCOUNTER — Ambulatory Visit (INDEPENDENT_AMBULATORY_CARE_PROVIDER_SITE_OTHER): Payer: Medicare Other

## 2015-11-15 DIAGNOSIS — E538 Deficiency of other specified B group vitamins: Secondary | ICD-10-CM

## 2015-11-15 MED ORDER — CYANOCOBALAMIN 1000 MCG/ML IJ SOLN
1000.0000 ug | Freq: Once | INTRAMUSCULAR | Status: AC
  Administered 2015-11-15: 1000 ug via INTRAMUSCULAR

## 2015-11-22 ENCOUNTER — Ambulatory Visit: Payer: Self-pay

## 2015-11-27 ENCOUNTER — Other Ambulatory Visit: Payer: Self-pay | Admitting: Family Medicine

## 2015-12-15 ENCOUNTER — Ambulatory Visit (INDEPENDENT_AMBULATORY_CARE_PROVIDER_SITE_OTHER): Payer: Medicare Other

## 2015-12-15 DIAGNOSIS — E538 Deficiency of other specified B group vitamins: Secondary | ICD-10-CM

## 2015-12-15 MED ORDER — CYANOCOBALAMIN 1000 MCG/ML IJ SOLN: 1000.0000 ug | Freq: Once | INTRAMUSCULAR | Status: DC

## 2015-12-27 DIAGNOSIS — E119 Type 2 diabetes mellitus without complications: Secondary | ICD-10-CM | POA: Diagnosis not present

## 2016-01-17 ENCOUNTER — Ambulatory Visit: Payer: Self-pay

## 2016-01-19 ENCOUNTER — Ambulatory Visit (INDEPENDENT_AMBULATORY_CARE_PROVIDER_SITE_OTHER): Payer: Medicare Other | Admitting: *Deleted

## 2016-01-19 DIAGNOSIS — E538 Deficiency of other specified B group vitamins: Secondary | ICD-10-CM | POA: Diagnosis not present

## 2016-01-19 MED ORDER — CYANOCOBALAMIN 1000 MCG/ML IJ SOLN
1000.0000 ug | Freq: Once | INTRAMUSCULAR | Status: AC
  Administered 2016-01-19: 1000 ug via INTRAMUSCULAR

## 2016-01-19 NOTE — Progress Notes (Signed)
Pre visit review using our clinic review tool, if applicable. No additional management support is needed unless otherwise documented below in the visit note.  Pt in for B12 injection. Injection given in R deltoid. Pt tolerated well.

## 2016-01-23 ENCOUNTER — Other Ambulatory Visit: Payer: Self-pay | Admitting: Family Medicine

## 2016-01-25 ENCOUNTER — Other Ambulatory Visit: Payer: Self-pay | Admitting: Family Medicine

## 2016-01-25 MED ORDER — PRAVASTATIN SODIUM 40 MG PO TABS: ORAL_TABLET | ORAL | Status: DC

## 2016-02-07 ENCOUNTER — Other Ambulatory Visit: Payer: Self-pay | Admitting: Family Medicine

## 2016-02-07 ENCOUNTER — Encounter: Payer: Self-pay | Admitting: Family Medicine

## 2016-02-07 ENCOUNTER — Ambulatory Visit (INDEPENDENT_AMBULATORY_CARE_PROVIDER_SITE_OTHER): Payer: Medicare Other | Admitting: Family Medicine

## 2016-02-07 VITALS — BP 112/64 | HR 73 | Temp 97.8°F | Ht 66.0 in | Wt 147.1 lb

## 2016-02-07 DIAGNOSIS — E538 Deficiency of other specified B group vitamins: Secondary | ICD-10-CM | POA: Diagnosis not present

## 2016-02-07 DIAGNOSIS — G629 Polyneuropathy, unspecified: Secondary | ICD-10-CM | POA: Diagnosis not present

## 2016-02-07 DIAGNOSIS — K219 Gastro-esophageal reflux disease without esophagitis: Secondary | ICD-10-CM

## 2016-02-07 DIAGNOSIS — E782 Mixed hyperlipidemia: Secondary | ICD-10-CM | POA: Diagnosis not present

## 2016-02-07 DIAGNOSIS — E1142 Type 2 diabetes mellitus with diabetic polyneuropathy: Secondary | ICD-10-CM | POA: Diagnosis not present

## 2016-02-07 DIAGNOSIS — I10 Essential (primary) hypertension: Secondary | ICD-10-CM | POA: Diagnosis not present

## 2016-02-07 DIAGNOSIS — R350 Frequency of micturition: Secondary | ICD-10-CM | POA: Diagnosis not present

## 2016-02-07 DIAGNOSIS — R000 Tachycardia, unspecified: Secondary | ICD-10-CM

## 2016-02-07 DIAGNOSIS — N189 Chronic kidney disease, unspecified: Secondary | ICD-10-CM

## 2016-02-07 DIAGNOSIS — E785 Hyperlipidemia, unspecified: Secondary | ICD-10-CM

## 2016-02-07 LAB — MICROALBUMIN / CREATININE URINE RATIO
Creatinine,U: 121.7 mg/dL
Microalb Creat Ratio: 0.6 mg/g (ref 0.0–30.0)
Microalb, Ur: <0.7 mg/dL (ref 0.0–1.9)

## 2016-02-07 LAB — COMPREHENSIVE METABOLIC PANEL
ALT: 10 U/L (ref 0–35)
AST: 17 U/L (ref 0–37)
Albumin: 4 g/dL (ref 3.5–5.2)
Alkaline Phosphatase: 66 U/L (ref 39–117)
BUN: 24 mg/dL — ABNORMAL HIGH (ref 6–23)
CO2: 33 mEq/L — ABNORMAL HIGH (ref 19–32)
Calcium: 9.5 mg/dL (ref 8.4–10.5)
Chloride: 105 mEq/L (ref 96–112)
Creatinine, Ser: 1.35 mg/dL — ABNORMAL HIGH (ref 0.40–1.20)
GFR: 39.35 mL/min — ABNORMAL LOW (ref >60.00)
Glucose, Bld: 94 mg/dL (ref 70–99)
Potassium: 5.1 mEq/L (ref 3.5–5.1)
Sodium: 140 mEq/L (ref 135–145)
Total Bilirubin: 0.3 mg/dL (ref 0.2–1.2)
Total Protein: 7.1 g/dL (ref 6.0–8.3)

## 2016-02-07 LAB — URINALYSIS
Bilirubin Urine: NEGATIVE
Hgb urine dipstick: NEGATIVE
Ketones, ur: NEGATIVE
Leukocytes, UA: NEGATIVE
Nitrite: NEGATIVE
Specific Gravity, Urine: 1.02 (ref 1.000–1.030)
Total Protein, Urine: NEGATIVE
Urine Glucose: NEGATIVE
Urobilinogen, UA: 0.2 (ref 0.0–1.0)
pH: 6 (ref 5.0–8.0)

## 2016-02-07 LAB — CBC
HEMATOCRIT: 35.4 % — AB (ref 36.0–46.0)
HEMOGLOBIN: 11.2 g/dL — AB (ref 12.0–15.0)
MCHC: 31.7 g/dL (ref 30.0–36.0)
MCV: 81.1 fl (ref 78.0–100.0)
Platelets: 252 10*3/uL (ref 150.0–400.0)
RBC: 4.36 Mil/uL (ref 3.87–5.11)
RDW: 16 % — AB (ref 11.5–15.5)
WBC: 4.8 10*3/uL (ref 4.0–10.5)

## 2016-02-07 LAB — LIPID PANEL
CHOLESTEROL: 151 mg/dL (ref 0–200)
HDL: 37.3 mg/dL — AB (ref >39.00)
LDL CALC: 82 mg/dL (ref 0–99)
NonHDL: 114.08
TRIGLYCERIDES: 162 mg/dL — AB (ref 0.0–149.0)
Total CHOL/HDL Ratio: 4
VLDL: 32.4 mg/dL (ref 0.0–40.0)

## 2016-02-07 LAB — TSH: TSH: 2.83 u[IU]/mL (ref 0.35–4.50)

## 2016-02-07 MED ORDER — METFORMIN HCL 500 MG PO TABS: 250.0000 mg | ORAL_TABLET | Freq: Once | ORAL | Status: DC

## 2016-02-07 MED ORDER — HYDROCODONE-ACETAMINOPHEN 5-325 MG PO TABS: 1.0000 | ORAL_TABLET | Freq: Four times a day (QID) | ORAL | Status: DC | PRN

## 2016-02-07 MED ORDER — CYANOCOBALAMIN 1000 MCG/ML IJ SOLN
1000.0000 ug | Freq: Once | INTRAMUSCULAR | Status: DC
Start: 2016-02-07 — End: 2016-02-07

## 2016-02-07 MED ORDER — CYANOCOBALAMIN 1000 MCG/ML IJ SOLN
1000.0000 ug | Freq: Once | INTRAMUSCULAR | Status: AC
  Administered 2016-02-07: 1000 ug via INTRAMUSCULAR

## 2016-02-07 MED ORDER — DIAZEPAM 5 MG PO TABS: ORAL_TABLET | ORAL | Status: DC

## 2016-02-07 MED ORDER — ESOMEPRAZOLE MAGNESIUM 40 MG PO CPDR
40.0000 mg | DELAYED_RELEASE_CAPSULE | Freq: Every day | ORAL | Status: DC
Start: 2016-02-07 — End: 2016-02-09

## 2016-02-07 MED ORDER — ESOMEPRAZOLE MAGNESIUM 40 MG PO PACK: 40.0000 mg | PACK | Freq: Every day | ORAL | Status: DC

## 2016-02-07 NOTE — Progress Notes (Signed)
Subjective:    Patient ID: Dana Burns, female    DOB: 1926/07/08, 82 y.o.   MRN: [id number]  Chief Complaint  Patient presents with  . Follow-up    HPI Patient is in today for follow up with numerous concerns blood sugars are running high since the  decrease in medication, before meals they are typically running between 120-150, patient also complaining of frequent urination. No recent illness. No acute concerns. Denies CP/palp/SOB/HA/congestion/fevers/GI c/o. Taking meds as prescribed   Past Medical History  Diagnosis Date  . Diabetes mellitus     6 years  . Peripheral neuropathy ([place])   . Fibromyalgia   . Colon cancer ([place])   . Complication of anesthesia     agitated when waking up, wakes up shaking, "like  I'm going into shock"  . Hypertension     15 years, reports she has a rapid heartrate   . Depression     husband died  3 months ago, pt. admits depression  currently   . Shortness of breath   . GERD (gastroesophageal reflux disease)   . Headache(784.0)     occasional - "bad headaches"  . Osteoarthritis     in hands & all over, feet  . H/O echocardiogram     done /w [place] in 2011, saw Dr. Percival Spanish for tachycardia   . Hyperlipidemia     6 years  . Chicken pox as a child  . Measles as a child  . Allergy     sneeze, rhinorrhea in am  . Pain of right shoulder region 07/07/2014  . DM (diabetes mellitus) type 2, uncontrolled, with ketoacidosis ([place]) 03/17/2011  . Chronic pain syndrome 01/23/2015  . Type 2 diabetes mellitus with peripheral neuropathy ([place]) 03/17/2011    Past Surgical History  Procedure Laterality Date  . Cataract extraction      /w IOL- bilateral   . Colon surgery      For resection of cancer  . Nodule removed      From throat  . Breast surgery  2013    right- benign    Family History  Problem Relation Age of Onset  . Cancer Mother     Brain  . Cancer Father     Colorectal  . Heart disease Neg Hx     Early  . Cancer Sister     breast    . Other Brother     pacemaker  . Cancer Brother     pancreatic cancer  . Arthritis Sister   . Emphysema Brother   . Cancer Brother   . COPD Brother   . Neuropathy Daughter   . Fibromyalgia Daughter     Social History   Social History  . Marital Status: Widowed    Spouse Name: N/A  . Number of Children: 1  . Years of Education: 9   Occupational History  . Retired    Social History Main Topics  . Smoking status: Never Smoker   . Smokeless tobacco: Never Used  . Alcohol Use: No  . Drug Use: No  . Sexual Activity: No     Comment: lives with daughter. no dietary restrictions   Other Topics Concern  . Not on file   Social History Narrative   Patient lives at home with daughter.    Patient is retired.    Patient is widowed.    Patient has 1 child.    Patient has a 9th grade education.     Outpatient  Prescriptions Prior to Visit  Medication Sig Dispense Refill  . acetaminophen (TYLENOL) 500 MG tablet Take 1,000 mg by mouth 2 (two) times daily.    . calcium carbonate (OS-CAL) 600 MG TABS Take 600 mg by mouth 2 (two) times daily with a meal.      . Cholecalciferol (VITAMIN D3) 5000 UNITS CAPS Take 5,000 Units by mouth daily.    . citalopram (CELEXA) 10 MG tablet TAKE 1 TABLET (10 MG TOTAL) BY MOUTH DAILY. 30 tablet 2  . gabapentin (NEURONTIN) 800 MG tablet TAKE 1 TABLET (800 MG TOTAL) BY MOUTH 3 (THREE) TIMES DAILY. 90 tablet 1  . pravastatin (PRAVACHOL) 40 MG tablet TAKE 1 TABLET (40 MG TOTAL) BY MOUTH AT BEDTIME. 30 tablet 4  . valsartan-hydrochlorothiazide (DIOVAN-HCT) 160-12.5 MG tablet TAKE 1 TABLET BY MOUTH DAILY. 30 tablet 3  . Cyanocobalamin 1000 MCG/ML KIT Inject 1,000 mcg as directed every 30 (thirty) days.    Marland Kitchen Dexlansoprazole 30 MG capsule Take 1 capsule (30 mg total) by mouth daily. 30 capsule 3  . diazepam (VALIUM) 5 MG tablet TAKE 1 AND A HALF TABLETS BY MOUTH TWICE A DAY AS NEEDED 45 tablet 1  . doxycycline (VIBRAMYCIN) 100 MG capsule Take 1 capsule (100  mg total) by mouth 2 (two) times daily. 20 capsule 0  . HYDROcodone-acetaminophen (NORCO/VICODIN) 5-325 MG tablet Take 1 tablet by mouth every 6 (six) hours as needed for moderate pain. 40 tablet 0  . metFORMIN (GLUCOPHAGE) 500 MG tablet TAKE 1 TABLET (500 MG TOTAL) BY MOUTH DAILY BEFORE BREAKFAST. 30 tablet 6   Facility-Administered Medications Prior to Visit  Medication Dose Route Frequency Provider Last Rate Last Dose  . cyanocobalamin ((VITAMIN B-12)) injection 1,000 mcg  1,000 mcg Intramuscular Once Mosie Lukes, MD        Allergies  Allergen Reactions  . Tape Itching and Rash  . Actos [Pioglitazone Hydrochloride]   . Buspar [Buspirone Hcl]   . Lipitor [Atorvastatin Calcium]   . Penicillins Rash    Review of Systems  Constitutional: Negative for fever and malaise/fatigue.  HENT: Negative for congestion.   Eyes: Negative for discharge.  Respiratory: Negative for shortness of breath.   Cardiovascular: Negative for chest pain, palpitations and leg swelling.  Gastrointestinal: Positive for heartburn. Negative for nausea and abdominal pain.  Genitourinary: Negative for dysuria.  Musculoskeletal: Negative for falls.  Skin: Negative for rash.  Neurological: Negative for loss of consciousness and headaches.  Endo/Heme/Allergies: Negative for environmental allergies.  Psychiatric/Behavioral: Negative for depression. The patient is not nervous/anxious.        Objective:    Physical Exam  Constitutional: She is oriented to person, place, and time. She appears well-developed and well-nourished. No distress.  HENT:  Head: Normocephalic and atraumatic.  Nose: Nose normal.  Eyes: Right eye exhibits no discharge. Left eye exhibits no discharge.  Neck: Normal range of motion. Neck supple.  Cardiovascular: Normal rate and regular rhythm.   No murmur heard. Pulmonary/Chest: Effort normal and breath sounds normal.  Abdominal: Soft. Bowel sounds are normal. There is no tenderness.    Musculoskeletal: She exhibits no edema.  Neurological: She is alert and oriented to person, place, and time.  Skin: Skin is warm and dry.  Psychiatric: She has a normal mood and affect.  Nursing note and vitals reviewed.   BP 112/64 mmHg  Pulse 73  Temp(Src) 97.8 F (36.6 C) (Oral)  Ht '5\' 6"'  (1.676 m)  Wt 147 lb 2 oz (66.735 kg)  BMI  23.76 kg/m2  SpO2 96% Wt Readings from Last 3 Encounters:  02/07/16 147 lb 2 oz (66.735 kg)  11/01/15 143 lb 2 oz (64.921 kg)  10/27/15 145 lb 4 oz (65.885 kg)     Lab Results  Component Value Date   WBC 4.8 02/07/2016   HGB 11.2* 02/07/2016   HCT 35.4* 02/07/2016   PLT 252.0 02/07/2016   GLUCOSE 94 02/07/2016   CHOL 151 02/07/2016   TRIG 162.0* 02/07/2016   HDL 37.30* 02/07/2016   LDLCALC 82 02/07/2016   ALT 10 02/07/2016   AST 17 02/07/2016   NA 140 02/07/2016   K 5.1 02/07/2016   CL 105 02/07/2016   CREATININE 1.35* 02/07/2016   BUN 24* 02/07/2016   CO2 33* 02/07/2016   TSH 2.83 02/07/2016   HGBA1C 6.5 10/27/2015   MICROALBUR <0.7 02/07/2016    Lab Results  Component Value Date   TSH 2.83 02/07/2016   Lab Results  Component Value Date   WBC 4.8 02/07/2016   HGB 11.2* 02/07/2016   HCT 35.4* 02/07/2016   MCV 81.1 02/07/2016   PLT 252.0 02/07/2016   Lab Results  Component Value Date   NA 140 02/07/2016   K 5.1 02/07/2016   CO2 33* 02/07/2016   GLUCOSE 94 02/07/2016   BUN 24* 02/07/2016   CREATININE 1.35* 02/07/2016   BILITOT 0.3 02/07/2016   ALKPHOS 66 02/07/2016   AST 17 02/07/2016   ALT 10 02/07/2016   PROT 7.1 02/07/2016   ALBUMIN 4.0 02/07/2016   CALCIUM 9.5 02/07/2016   GFR 39.35* 02/07/2016   Lab Results  Component Value Date   CHOL 151 02/07/2016   Lab Results  Component Value Date   HDL 37.30* 02/07/2016   Lab Results  Component Value Date   LDLCALC 82 02/07/2016   Lab Results  Component Value Date   TRIG 162.0* 02/07/2016   Lab Results  Component Value Date   CHOLHDL 4 02/07/2016    Lab Results  Component Value Date   HGBA1C 6.5 10/27/2015       Assessment & Plan:   Problem List Items Addressed This Visit    CKD (chronic kidney disease)    Stable, will continue to monitor      Relevant Medications   HYDROcodone-acetaminophen (NORCO/VICODIN) 5-325 MG tablet   Essential hypertension, benign    Well controlled, no changes to meds. Encouraged heart healthy diet such as the DASH diet and exercise as tolerated.       Relevant Medications   HYDROcodone-acetaminophen (NORCO/VICODIN) 5-325 MG tablet   Other Relevant Orders   CBC (Completed)   Comprehensive metabolic panel (Completed)   GERD (gastroesophageal reflux disease)    Avoid offending foods, start probiotics. Do not eat large meals in late evening and consider raising head of bed. Is having some trouble with her insurance but would like to restart Protonix      Relevant Medications   HYDROcodone-acetaminophen (NORCO/VICODIN) 5-325 MG tablet   pantoprazole (PROTONIX) 40 MG tablet   Hyperlipidemia    Encouraged heart healthy diet, increase exercise, avoid trans fats, consider a krill oil cap daily      Relevant Medications   HYDROcodone-acetaminophen (NORCO/VICODIN) 5-325 MG tablet   Tachycardia    RRR  today      Relevant Medications   HYDROcodone-acetaminophen (NORCO/VICODIN) 5-325 MG tablet   Other Relevant Orders   CBC (Completed)   Lipid panel (Completed)   Comprehensive metabolic panel (Completed)   TSH (Completed)   Type 2  diabetes mellitus with peripheral neuropathy ([facility])    hgba1c acceptable, minimize simple carbs. Increase exercise as tolerated. Continue current meds      Relevant Medications   HYDROcodone-acetaminophen (NORCO/VICODIN) 5-325 MG tablet   diazepam (VALIUM) 5 MG tablet   metFORMIN (GLUCOPHAGE) 500 MG tablet   Other Relevant Orders   Microalbumin / creatinine urine ratio (Completed)   Vitamin B 12 deficiency   Relevant Medications   HYDROcodone-acetaminophen  (NORCO/VICODIN) 5-325 MG tablet    Other Visit Diagnoses    Neuropathy ([employer])    -  Primary    Relevant Medications    HYDROcodone-acetaminophen (NORCO/VICODIN) 5-325 MG tablet    Hyperlipidemia, mixed        Relevant Medications    HYDROcodone-acetaminophen (NORCO/VICODIN) 5-325 MG tablet    Other Relevant Orders    Lipid panel (Completed)    Vitamin B12 deficiency        Relevant Medications    HYDROcodone-acetaminophen (NORCO/VICODIN) 5-325 MG tablet    cyanocobalamin ((VITAMIN B-12)) injection 1,000 mcg (Completed)    Frequent urination        Relevant Orders    Urinalysis (Completed)    Microalbumin / creatinine urine ratio (Completed)       I have discontinued Ms. Skidmore's Cyanocobalamin, doxycycline, Dexlansoprazole, cyanocobalamin, and esomeprazole. I have also changed her metFORMIN. Additionally, I am having her start on Ferrous Fumarate-Folic Acid and pantoprazole. Lastly, I am having her maintain her calcium carbonate, Vitamin D3, acetaminophen, valsartan-hydrochlorothiazide, citalopram, gabapentin, pravastatin, HYDROcodone-acetaminophen, and diazepam. We administered cyanocobalamin. We will continue to administer cyanocobalamin.  Meds ordered this encounter  Medications  . HYDROcodone-acetaminophen (NORCO/VICODIN) 5-325 MG tablet    Sig: Take 1 tablet by mouth every 6 (six) hours as needed for moderate pain.    Dispense:  40 tablet    Refill:  0  . diazepam (VALIUM) 5 MG tablet    Sig: TAKE 1 AND A HALF TABLETS BY MOUTH TWICE A DAY AS NEEDED    Dispense:  45 tablet    Refill:  1  . metFORMIN (GLUCOPHAGE) 500 MG tablet    Sig: Take 0.5 tablets (250 mg total) by mouth once.    Dispense:  30 tablet    Refill:  6  . DISCONTD: cyanocobalamin (,VITAMIN B-12,) 1000 MCG/ML injection    Sig: Inject 1 mL (1,000 mcg total) into the muscle once.    Dispense:  1 mL    Refill:  0  . cyanocobalamin ((VITAMIN B-12)) injection 1,000 mcg    Sig:   . DISCONTD: esomeprazole (NEXIUM)  40 MG packet    Sig: Take 40 mg by mouth daily before breakfast.    Dispense:  30 each    Refill:  5  . Ferrous Fumarate-Folic Acid 497-0 MG TABS    Sig: Take 1 tablet by mouth daily.    Dispense:  30 each    Refill:  3  . pantoprazole (PROTONIX) 40 MG tablet    Sig: Take 1 tablet (40 mg total) by mouth daily.    Dispense:  30 tablet    Refill:  3     Penni Homans, MD

## 2016-02-07 NOTE — Progress Notes (Signed)
Pre visit review using our clinic review tool, if applicable. No additional management support is needed unless otherwise documented below in the visit note. 

## 2016-02-07 NOTE — Assessment & Plan Note (Signed)
Well controlled, no changes to meds. Encouraged heart healthy diet such as the DASH diet and exercise as tolerated.  °

## 2016-02-07 NOTE — Assessment & Plan Note (Signed)
Encouraged heart healthy diet, increase exercise, avoid trans fats, consider a krill oil cap daily 

## 2016-02-07 NOTE — Telephone Encounter (Signed)
Updated medication list d/c nexium 40 mg packets (sent in by accident), sent in nexium 40 mg capsules.

## 2016-02-07 NOTE — Patient Instructions (Signed)

## 2016-02-08 MED ORDER — FERROUS FUMARATE-FOLIC ACID 324-1 MG PO TABS: 1.0000 | ORAL_TABLET | Freq: Every day | ORAL | Status: DC

## 2016-02-09 ENCOUNTER — Other Ambulatory Visit: Payer: Self-pay | Admitting: Family Medicine

## 2016-02-09 MED ORDER — PANTOPRAZOLE SODIUM 40 MG PO TBEC: 40.0000 mg | DELAYED_RELEASE_TABLET | Freq: Every day | ORAL | Status: DC

## 2016-02-12 NOTE — Assessment & Plan Note (Signed)
Avoid offending foods, start probiotics. Do not eat large meals in late evening and consider raising head of bed. Is having some trouble with her insurance but would like to restart Protonix

## 2016-02-12 NOTE — Assessment & Plan Note (Signed)
Stable, will continue to monitor.

## 2016-02-12 NOTE — Assessment & Plan Note (Signed)
hgba1c acceptable, minimize simple carbs. Increase exercise as tolerated. Continue current meds 

## 2016-02-12 NOTE — Assessment & Plan Note (Signed)
RRR today 

## 2016-02-20 ENCOUNTER — Other Ambulatory Visit: Payer: Self-pay | Admitting: Family Medicine

## 2016-03-06 ENCOUNTER — Other Ambulatory Visit: Payer: Self-pay | Admitting: Family Medicine

## 2016-03-06 ENCOUNTER — Ambulatory Visit (INDEPENDENT_AMBULATORY_CARE_PROVIDER_SITE_OTHER): Payer: Medicare Other | Admitting: Behavioral Health

## 2016-03-06 DIAGNOSIS — E538 Deficiency of other specified B group vitamins: Secondary | ICD-10-CM | POA: Diagnosis not present

## 2016-03-06 MED ORDER — CYANOCOBALAMIN 1000 MCG/ML IJ SOLN
1000.0000 ug | Freq: Once | INTRAMUSCULAR | Status: AC
  Administered 2016-03-06: 1000 ug via INTRAMUSCULAR

## 2016-03-06 NOTE — Progress Notes (Signed)
Pre visit review using our clinic review tool, if applicable. No additional management support is needed unless otherwise documented below in the visit note.  Patient in office for B12 injection. IM given in Left Deltoid. Patient tolerated injection well.

## 2016-03-19 ENCOUNTER — Other Ambulatory Visit: Payer: Self-pay | Admitting: Family Medicine

## 2016-04-12 ENCOUNTER — Ambulatory Visit (INDEPENDENT_AMBULATORY_CARE_PROVIDER_SITE_OTHER): Payer: Medicare Other

## 2016-04-12 DIAGNOSIS — E538 Deficiency of other specified B group vitamins: Secondary | ICD-10-CM | POA: Diagnosis not present

## 2016-04-12 MED ORDER — CYANOCOBALAMIN 1000 MCG/ML IJ SOLN
1000.0000 ug | Freq: Once | INTRAMUSCULAR | Status: AC
  Administered 2016-04-12: 1000 ug via INTRAMUSCULAR

## 2016-04-12 NOTE — Progress Notes (Signed)
Pre visit review using our clinic tool,if applicable. No additional management support is needed unless otherwise documented below in the visit note.   Patient in for B12 injection. Receives monthly due to B 12 deficiency per Dr. Charlett Blake. Injection given IM Right deltoid. Patients medications remain the same. No complaints voiced. Patient states she will schedule next appointment next month when she sees provider.

## 2016-04-16 ENCOUNTER — Telehealth: Payer: Self-pay | Admitting: Family Medicine

## 2016-04-16 MED ORDER — METFORMIN HCL 500 MG PO TABS: 500.0000 mg | ORAL_TABLET | Freq: Once | ORAL | Status: DC

## 2016-04-16 NOTE — Telephone Encounter (Signed)
Relation to [id number] Call back [place]: [facility] #[id number] - [place], [address] [phone] (Phone) [phone] (Fax)         Reason for call:  Patient states metFORMIN (GLUCOPHAGE) 500 MG tablet should be a whole pill not a 1/2 pill requesting a new script sent to retail pharmacy. Patient also stated requesting diabetic supplies test strips and needles please send to  "[facility]", please call to confirm supply company. Patient seemed unsure.

## 2016-04-16 NOTE — Telephone Encounter (Signed)
Can stay on the Metformin 1 tab daily, if no sugars noted below 80, disp #30 with 2 rf or #90 with no RF at patient discretion

## 2016-04-16 NOTE — Telephone Encounter (Signed)
Patient has been notified and sent in refill for #30 with 2 rf

## 2016-04-18 DIAGNOSIS — E119 Type 2 diabetes mellitus without complications: Secondary | ICD-10-CM | POA: Diagnosis not present

## 2016-04-20 DIAGNOSIS — E119 Type 2 diabetes mellitus without complications: Secondary | ICD-10-CM | POA: Diagnosis not present

## 2016-05-10 ENCOUNTER — Encounter: Payer: Self-pay | Admitting: Family Medicine

## 2016-05-10 ENCOUNTER — Ambulatory Visit (INDEPENDENT_AMBULATORY_CARE_PROVIDER_SITE_OTHER): Payer: Medicare Other | Admitting: Family Medicine

## 2016-05-10 VITALS — BP 132/82 | HR 67 | Temp 98.3°F | Ht 66.0 in | Wt 144.5 lb

## 2016-05-10 DIAGNOSIS — N189 Chronic kidney disease, unspecified: Secondary | ICD-10-CM

## 2016-05-10 DIAGNOSIS — E782 Mixed hyperlipidemia: Secondary | ICD-10-CM | POA: Diagnosis not present

## 2016-05-10 DIAGNOSIS — E1142 Type 2 diabetes mellitus with diabetic polyneuropathy: Secondary | ICD-10-CM

## 2016-05-10 DIAGNOSIS — I10 Essential (primary) hypertension: Secondary | ICD-10-CM

## 2016-05-10 DIAGNOSIS — E1149 Type 2 diabetes mellitus with other diabetic neurological complication: Secondary | ICD-10-CM

## 2016-05-10 DIAGNOSIS — G629 Polyneuropathy, unspecified: Secondary | ICD-10-CM

## 2016-05-10 DIAGNOSIS — K219 Gastro-esophageal reflux disease without esophagitis: Secondary | ICD-10-CM

## 2016-05-10 DIAGNOSIS — E538 Deficiency of other specified B group vitamins: Secondary | ICD-10-CM

## 2016-05-10 DIAGNOSIS — R000 Tachycardia, unspecified: Secondary | ICD-10-CM | POA: Diagnosis not present

## 2016-05-10 DIAGNOSIS — E785 Hyperlipidemia, unspecified: Secondary | ICD-10-CM

## 2016-05-10 DIAGNOSIS — G894 Chronic pain syndrome: Secondary | ICD-10-CM

## 2016-05-10 MED ORDER — HYDROCODONE-ACETAMINOPHEN 5-325 MG PO TABS: 1.0000 | ORAL_TABLET | Freq: Four times a day (QID) | ORAL | Status: DC | PRN

## 2016-05-10 MED ORDER — CYANOCOBALAMIN 1000 MCG/ML IJ SOLN
1000.0000 ug | Freq: Once | INTRAMUSCULAR | Status: AC
  Administered 2016-05-10: 1000 ug via INTRAMUSCULAR

## 2016-05-10 NOTE — Patient Instructions (Signed)

## 2016-05-10 NOTE — Assessment & Plan Note (Addendum)
Given a shot today and check a Vitamin B 12

## 2016-05-10 NOTE — Progress Notes (Signed)
Pre visit review using our clinic review tool, if applicable. No additional management support is needed unless otherwise documented below in the visit note. 

## 2016-05-11 LAB — LIPID PANEL
CHOL/HDL RATIO: 4
Cholesterol: 157 mg/dL (ref 0–200)
HDL: 39.1 mg/dL (ref >39.00)
LDL CALC: 93 mg/dL (ref 0–99)
NONHDL: 117.72
Triglycerides: 122 mg/dL (ref 0.0–149.0)
VLDL: 24.4 mg/dL (ref 0.0–40.0)

## 2016-05-11 LAB — COMPREHENSIVE METABOLIC PANEL
ALT: 10 U/L (ref 0–35)
AST: 18 U/L (ref 0–37)
Albumin: 4.2 g/dL (ref 3.5–5.2)
Alkaline Phosphatase: 64 U/L (ref 39–117)
BILIRUBIN TOTAL: 0.3 mg/dL (ref 0.2–1.2)
BUN: 23 mg/dL (ref 6–23)
CALCIUM: 9.7 mg/dL (ref 8.4–10.5)
CHLORIDE: 104 meq/L (ref 96–112)
CO2: 29 meq/L (ref 19–32)
Creatinine, Ser: 1.21 mg/dL — ABNORMAL HIGH (ref 0.40–1.20)
GFR: 44.62 mL/min — AB (ref >60.00)
Glucose, Bld: 89 mg/dL (ref 70–99)
Potassium: 5.1 mEq/L (ref 3.5–5.1)
Sodium: 141 mEq/L (ref 135–145)
Total Protein: 7 g/dL (ref 6.0–8.3)

## 2016-05-11 LAB — CBC
HCT: 37.5 % (ref 36.0–46.0)
HEMOGLOBIN: 12.2 g/dL (ref 12.0–15.0)
MCHC: 32.6 g/dL (ref 30.0–36.0)
MCV: 82.4 fl (ref 78.0–100.0)
PLATELETS: 232 10*3/uL (ref 150.0–400.0)
RBC: 4.54 Mil/uL (ref 3.87–5.11)
RDW: 17.4 % — ABNORMAL HIGH (ref 11.5–15.5)
WBC: 5.6 10*3/uL (ref 4.0–10.5)

## 2016-05-11 LAB — TSH: TSH: 2.03 u[IU]/mL (ref 0.35–4.50)

## 2016-05-11 LAB — VITAMIN B12: Vitamin B-12: >1500 pg/mL — ABNORMAL HIGH (ref 211–911)

## 2016-05-13 LAB — INTRINSIC FACTOR ANTIBODIES: INTRINSIC FACTOR: POSITIVE — AB

## 2016-05-14 ENCOUNTER — Other Ambulatory Visit: Payer: Self-pay | Admitting: Family Medicine

## 2016-05-14 DIAGNOSIS — E538 Deficiency of other specified B group vitamins: Secondary | ICD-10-CM

## 2016-05-17 ENCOUNTER — Other Ambulatory Visit: Payer: Self-pay | Admitting: Family Medicine

## 2016-05-17 ENCOUNTER — Other Ambulatory Visit: Payer: Self-pay

## 2016-05-17 DIAGNOSIS — Z1231 Encounter for screening mammogram for malignant neoplasm of breast: Secondary | ICD-10-CM

## 2016-05-20 NOTE — Assessment & Plan Note (Signed)
Well controlled, no changes to meds. Encouraged heart healthy diet such as the DASH diet and exercise as tolerated.  °

## 2016-05-20 NOTE — Assessment & Plan Note (Signed)
hgba1c acceptable, minimize simple carbs. Increase exercise as tolerated. Continue current meds 

## 2016-05-20 NOTE — Assessment & Plan Note (Signed)
RRR today 

## 2016-05-20 NOTE — Progress Notes (Signed)
Patient ID: Dana Burns, female   DOB: 04/18/1926, 82 y.o.   MRN: [id number]   Subjective:    Patient ID: Dana Burns, female    DOB: 1926/12/28, 82 y.o.   MRN: [id number]  Chief Complaint  Patient presents with  . Follow-up    HPI Patient is in today for follow up. She continues to struggle with fatigue but manages her ADLs adequately. Her neck pain and stiffness continues to be her major pain constraint. No recent illness or other acute concerns. Denies CP/palp/SOB/HA/congestion/fevers/GI or GU c/o. Taking meds as prescribed  Past Medical History  Diagnosis Date  . Diabetes mellitus     6 years  . Peripheral neuropathy ([place])   . Fibromyalgia   . Colon cancer ([place])   . Complication of anesthesia     agitated when waking up, wakes up shaking, "like  I'm going into shock"  . Hypertension     15 years, reports she has a rapid heartrate   . Depression     husband died  3 months ago, pt. admits depression  currently   . Shortness of breath   . GERD (gastroesophageal reflux disease)   . Headache(784.0)     occasional - "bad headaches"  . Osteoarthritis     in hands & all over, feet  . H/O echocardiogram     done /w [place] in 2011, saw Dr. Percival Spanish for tachycardia   . Hyperlipidemia     6 years  . Chicken pox as a child  . Measles as a child  . Allergy     sneeze, rhinorrhea in am  . Pain of right shoulder region 07/07/2014  . DM (diabetes mellitus) type 2, uncontrolled, with ketoacidosis ([place]) 03/17/2011  . Chronic pain syndrome 01/23/2015  . Type 2 diabetes mellitus with peripheral neuropathy ([place]) 03/17/2011    Past Surgical History  Procedure Laterality Date  . Cataract extraction      /w IOL- bilateral   . Colon surgery      For resection of cancer  . Nodule removed      From throat  . Breast surgery  2013    right- benign    Family History  Problem Relation Age of Onset  . Cancer Mother     Brain  . Cancer Father     Colorectal  . Heart disease Neg Hx     Early  . Cancer Sister     breast  . Other Brother     pacemaker  . Cancer Brother     pancreatic cancer  . Arthritis Sister   . Emphysema Brother   . Cancer Brother   . COPD Brother   . Neuropathy Daughter   . Fibromyalgia Daughter     Social History   Social History  . Marital Status: Widowed    Spouse Name: N/A  . Number of Children: 1  . Years of Education: 9   Occupational History  . Retired    Social History Main Topics  . Smoking status: Never Smoker   . Smokeless tobacco: Never Used  . Alcohol Use: No  . Drug Use: No  . Sexual Activity: No     Comment: lives with daughter. no dietary restrictions   Other Topics Concern  . Not on file   Social History Narrative   Patient lives at home with daughter.    Patient is retired.    Patient is widowed.    Patient has 1 child.  Patient has a 9th grade education.     Outpatient Prescriptions Prior to Visit  Medication Sig Dispense Refill  . acetaminophen (TYLENOL) 500 MG tablet Take 1,000 mg by mouth 2 (two) times daily.    . calcium carbonate (OS-CAL) 600 MG TABS Take 600 mg by mouth 2 (two) times daily with a meal.      . Cholecalciferol (VITAMIN D3) 5000 UNITS CAPS Take 5,000 Units by mouth daily.    . diazepam (VALIUM) 5 MG tablet TAKE 1 AND A HALF TABLETS BY MOUTH TWICE A DAY AS NEEDED 45 tablet 1  . Ferrous Fumarate-Folic Acid 99991111 MG TABS Take 1 tablet by mouth daily. 30 each 3  . gabapentin (NEURONTIN) 800 MG tablet TAKE 1 TABLET (800 MG TOTAL) BY MOUTH 3 (THREE) TIMES DAILY. 90 tablet 1  . metFORMIN (GLUCOPHAGE) 500 MG tablet Take 1 tablet (500 mg total) by mouth once. 30 tablet 2  . pantoprazole (PROTONIX) 40 MG tablet Take 1 tablet (40 mg total) by mouth daily. 30 tablet 3  . pravastatin (PRAVACHOL) 40 MG tablet TAKE 1 TABLET (40 MG TOTAL) BY MOUTH AT BEDTIME. 30 tablet 4  . valsartan-hydrochlorothiazide (DIOVAN-HCT) 160-12.5 MG tablet TAKE 1 TABLET BY MOUTH DAILY. 30 tablet 6  . citalopram  (CELEXA) 10 MG tablet TAKE 1 TABLET (10 MG TOTAL) BY MOUTH DAILY. 30 tablet 2  . HYDROcodone-acetaminophen (NORCO/VICODIN) 5-325 MG tablet Take 1 tablet by mouth every 6 (six) hours as needed for moderate pain. 40 tablet 0   Facility-Administered Medications Prior to Visit  Medication Dose Route Frequency Provider Last Rate Last Dose  . cyanocobalamin ((VITAMIN B-12)) injection 1,000 mcg  1,000 mcg Intramuscular Once Mosie Lukes, MD        Allergies  Allergen Reactions  . Tape Itching and Rash  . Actos [Pioglitazone Hydrochloride]   . Buspar [Buspirone Hcl]   . Lipitor [Atorvastatin Calcium]   . Penicillins Rash    Review of Systems  Constitutional: Positive for malaise/fatigue. Negative for fever.  HENT: Negative for congestion.   Eyes: Negative for blurred vision.  Respiratory: Negative for shortness of breath.   Cardiovascular: Negative for chest pain, palpitations and leg swelling.  Gastrointestinal: Negative for nausea, abdominal pain and blood in stool.  Genitourinary: Negative for dysuria and frequency.  Musculoskeletal: Positive for back pain and neck pain. Negative for falls.  Skin: Negative for rash.  Neurological: Negative for dizziness, loss of consciousness and headaches.  Endo/Heme/Allergies: Negative for environmental allergies.  Psychiatric/Behavioral: Negative for depression. The patient is nervous/anxious.        Objective:    Physical Exam  Constitutional: She is oriented to person, place, and time. She appears well-developed and well-nourished. No distress.  HENT:  Head: Normocephalic and atraumatic.  Nose: Nose normal.  Eyes: Right eye exhibits no discharge. Left eye exhibits no discharge.  Neck: Normal range of motion. Neck supple.  Cardiovascular: Normal rate and regular rhythm.   No murmur heard. Pulmonary/Chest: Effort normal and breath sounds normal.  Abdominal: Soft. Bowel sounds are normal. There is no tenderness.  Musculoskeletal: She  exhibits no edema.  Neurological: She is alert and oriented to person, place, and time. A cranial nerve deficit is present.  Skin: Skin is warm and dry.  Psychiatric: She has a normal mood and affect.  Nursing note and vitals reviewed.   BP 132/82 mmHg  Pulse 67  Temp(Src) 98.3 F (36.8 C) (Oral)  Ht 5\' 6"  (1.676 m)  Wt 144 lb 8 oz (  65.545 kg)  BMI 23.33 kg/m2  SpO2 97% Wt Readings from Last 3 Encounters:  05/10/16 144 lb 8 oz (65.545 kg)  02/07/16 147 lb 2 oz (66.735 kg)  11/01/15 143 lb 2 oz (64.921 kg)     Lab Results  Component Value Date   WBC 5.6 05/10/2016   HGB 12.2 05/10/2016   HCT 37.5 05/10/2016   PLT 232.0 05/10/2016   GLUCOSE 89 05/10/2016   CHOL 157 05/10/2016   TRIG 122.0 05/10/2016   HDL 39.10 05/10/2016   LDLCALC 93 05/10/2016   ALT 10 05/10/2016   AST 18 05/10/2016   NA 141 05/10/2016   K 5.1 05/10/2016   CL 104 05/10/2016   CREATININE 1.21* 05/10/2016   BUN 23 05/10/2016   CO2 29 05/10/2016   TSH 2.03 05/10/2016   HGBA1C 6.5 10/27/2015   MICROALBUR <0.7 02/07/2016    Lab Results  Component Value Date   TSH 2.03 05/10/2016   Lab Results  Component Value Date   WBC 5.6 05/10/2016   HGB 12.2 05/10/2016   HCT 37.5 05/10/2016   MCV 82.4 05/10/2016   PLT 232.0 05/10/2016   Lab Results  Component Value Date   NA 141 05/10/2016   K 5.1 05/10/2016   CO2 29 05/10/2016   GLUCOSE 89 05/10/2016   BUN 23 05/10/2016   CREATININE 1.21* 05/10/2016   BILITOT 0.3 05/10/2016   ALKPHOS 64 05/10/2016   AST 18 05/10/2016   ALT 10 05/10/2016   PROT 7.0 05/10/2016   ALBUMIN 4.2 05/10/2016   CALCIUM 9.7 05/10/2016   GFR 44.62* 05/10/2016   Lab Results  Component Value Date   CHOL 157 05/10/2016   Lab Results  Component Value Date   HDL 39.10 05/10/2016   Lab Results  Component Value Date   LDLCALC 93 05/10/2016   Lab Results  Component Value Date   TRIG 122.0 05/10/2016   Lab Results  Component Value Date   CHOLHDL 4 05/10/2016    Lab Results  Component Value Date   HGBA1C 6.5 10/27/2015       Assessment & Plan:   Problem List Items Addressed This Visit    Vitamin B 12 deficiency    Given a shot today and check a Vitamin B 12       Relevant Medications   HYDROcodone-acetaminophen (NORCO/VICODIN) 5-325 MG tablet   cyanocobalamin ((VITAMIN B-12)) injection 1,000 mcg (Completed)   Other Relevant Orders   TSH (Completed)   CBC (Completed)   Lipid panel (Completed)   Vitamin B12 (Completed)   Comprehensive metabolic panel (Completed)   Intrinsic Factor Antibodies (Completed)   Type 2 diabetes mellitus with peripheral neuropathy (HCC)   Relevant Medications   HYDROcodone-acetaminophen (NORCO/VICODIN) 5-325 MG tablet   Other Relevant Orders   TSH (Completed)   CBC (Completed)   Lipid panel (Completed)   Vitamin B12 (Completed)   Comprehensive metabolic panel (Completed)   Intrinsic Factor Antibodies (Completed)   Tachycardia    RRR today      Relevant Medications   HYDROcodone-acetaminophen (NORCO/VICODIN) 5-325 MG tablet   Other Relevant Orders   TSH (Completed)   CBC (Completed)   Lipid panel (Completed)   Vitamin B12 (Completed)   Comprehensive metabolic panel (Completed)   Intrinsic Factor Antibodies (Completed)   Hyperlipidemia    Tolerating statin, encouraged heart healthy diet, avoid trans fats, minimize simple carbs and saturated fats. Increase exercise as tolerated      Relevant Medications   HYDROcodone-acetaminophen (NORCO/VICODIN) 5-325 MG  tablet   Other Relevant Orders   TSH (Completed)   CBC (Completed)   Lipid panel (Completed)   Vitamin B12 (Completed)   Comprehensive metabolic panel (Completed)   Intrinsic Factor Antibodies (Completed)   GERD (gastroesophageal reflux disease)   Relevant Medications   HYDROcodone-acetaminophen (NORCO/VICODIN) 5-325 MG tablet   Other Relevant Orders   TSH (Completed)   CBC (Completed)   Lipid panel (Completed)   Vitamin B12  (Completed)   Comprehensive metabolic panel (Completed)   Intrinsic Factor Antibodies (Completed)   Essential hypertension, benign    Well controlled, no changes to meds. Encouraged heart healthy diet such as the DASH diet and exercise as tolerated.       Relevant Medications   HYDROcodone-acetaminophen (NORCO/VICODIN) 5-325 MG tablet   Other Relevant Orders   TSH (Completed)   CBC (Completed)   Lipid panel (Completed)   Vitamin B12 (Completed)   Comprehensive metabolic panel (Completed)   Intrinsic Factor Antibodies (Completed)   Diabetic neuropathy (HCC)    hgba1c acceptable, minimize simple carbs. Increase exercise as tolerated. Continue current meds      CKD (chronic kidney disease)   Relevant Medications   HYDROcodone-acetaminophen (NORCO/VICODIN) 5-325 MG tablet   Other Relevant Orders   TSH (Completed)   CBC (Completed)   Lipid panel (Completed)   Vitamin B12 (Completed)   Comprehensive metabolic panel (Completed)   Intrinsic Factor Antibodies (Completed)   Chronic pain syndrome    Neck has been her greatest concern at this time. Allowed a refill on Hydrocodone. Encouraged moist heat and gentle stretching as tolerated. May try NSAIDs and prescription meds as directed and report if symptoms worsen or seek immediate care       Other Visit Diagnoses    Neuropathy ([place])    -  Primary    Relevant Medications    HYDROcodone-acetaminophen (NORCO/VICODIN) 5-325 MG tablet    Other Relevant Orders    TSH (Completed)    CBC (Completed)    Lipid panel (Completed)    Vitamin B12 (Completed)    Comprehensive metabolic panel (Completed)    Intrinsic Factor Antibodies (Completed)    Hyperlipidemia, mixed        Relevant Medications    HYDROcodone-acetaminophen (NORCO/VICODIN) 5-325 MG tablet    Other Relevant Orders    TSH (Completed)    CBC (Completed)    Lipid panel (Completed)    Vitamin B12 (Completed)    Comprehensive metabolic panel (Completed)    Intrinsic Factor  Antibodies (Completed)    Vitamin B12 deficiency        Relevant Medications    HYDROcodone-acetaminophen (NORCO/VICODIN) 5-325 MG tablet    cyanocobalamin ((VITAMIN B-12)) injection 1,000 mcg (Completed)    Other Relevant Orders    TSH (Completed)    CBC (Completed)    Lipid panel (Completed)    Vitamin B12 (Completed)    Comprehensive metabolic panel (Completed)    Intrinsic Factor Antibodies (Completed)       I am having Ms. Brasel maintain her calcium carbonate, Vitamin D3, acetaminophen, pravastatin, diazepam, Ferrous Fumarate-Folic Acid, pantoprazole, valsartan-hydrochlorothiazide, gabapentin, metFORMIN, and HYDROcodone-acetaminophen. We administered cyanocobalamin. We will continue to administer cyanocobalamin.  Meds ordered this encounter  Medications  . HYDROcodone-acetaminophen (NORCO/VICODIN) 5-325 MG tablet    Sig: Take 1 tablet by mouth every 6 (six) hours as needed for moderate pain.    Dispense:  60 tablet    Refill:  0  . cyanocobalamin ((VITAMIN B-12)) injection 1,000 mcg    Sig:  Penni Homans, MD

## 2016-05-20 NOTE — Assessment & Plan Note (Signed)
Tolerating statin, encouraged heart healthy diet, avoid trans fats, minimize simple carbs and saturated fats. Increase exercise as tolerated 

## 2016-05-20 NOTE — Assessment & Plan Note (Signed)
Neck has been her greatest concern at this time. Allowed a refill on Hydrocodone. Encouraged moist heat and gentle stretching as tolerated. May try NSAIDs and prescription meds as directed and report if symptoms worsen or seek immediate care

## 2016-06-05 ENCOUNTER — Other Ambulatory Visit: Payer: Self-pay | Admitting: Family Medicine

## 2016-06-09 ENCOUNTER — Other Ambulatory Visit: Payer: Self-pay | Admitting: Family Medicine

## 2016-06-12 ENCOUNTER — Other Ambulatory Visit (INDEPENDENT_AMBULATORY_CARE_PROVIDER_SITE_OTHER): Payer: Medicare Other

## 2016-06-12 DIAGNOSIS — E538 Deficiency of other specified B group vitamins: Secondary | ICD-10-CM

## 2016-06-12 LAB — VITAMIN B12: Vitamin B-12: 564 pg/mL (ref 211–911)

## 2016-06-13 ENCOUNTER — Ambulatory Visit: Payer: Self-pay

## 2016-06-14 ENCOUNTER — Ambulatory Visit (INDEPENDENT_AMBULATORY_CARE_PROVIDER_SITE_OTHER): Payer: Medicare Other | Admitting: Behavioral Health

## 2016-06-14 DIAGNOSIS — E538 Deficiency of other specified B group vitamins: Secondary | ICD-10-CM | POA: Diagnosis not present

## 2016-06-14 MED ORDER — CYANOCOBALAMIN 1000 MCG/ML IJ SOLN
1000.0000 ug | Freq: Once | INTRAMUSCULAR | Status: AC
  Administered 2016-06-14: 1000 ug via INTRAMUSCULAR

## 2016-06-14 NOTE — Progress Notes (Signed)
Pre visit review using our clinic review tool, if applicable. No additional management support is needed unless otherwise documented below in the visit note.  Patient presents in office today for B12 injection. IM given in Left Deltoid. Patient tolerated injection well. Next appointment scheduled for 07/17/16 at 2:15 PM.

## 2016-07-05 ENCOUNTER — Ambulatory Visit
Admission: RE | Admit: 2016-07-05 | Discharge: 2016-07-05 | Disposition: A | Discharge status: Home / Self Care | Payer: Medicare Other | Source: Ambulatory Visit

## 2016-07-05 ENCOUNTER — Other Ambulatory Visit: Payer: Self-pay | Admitting: Family Medicine

## 2016-07-05 DIAGNOSIS — Z1231 Encounter for screening mammogram for malignant neoplasm of breast: Secondary | ICD-10-CM | POA: Diagnosis not present

## 2016-07-17 ENCOUNTER — Ambulatory Visit (INDEPENDENT_AMBULATORY_CARE_PROVIDER_SITE_OTHER): Payer: Medicare Other | Admitting: Behavioral Health

## 2016-07-17 DIAGNOSIS — E538 Deficiency of other specified B group vitamins: Secondary | ICD-10-CM | POA: Diagnosis not present

## 2016-07-17 MED ORDER — CYANOCOBALAMIN 1000 MCG/ML IJ SOLN
1000.0000 ug | Freq: Once | INTRAMUSCULAR | Status: AC
  Administered 2016-07-17: 1000 ug via INTRAMUSCULAR

## 2016-07-17 NOTE — Progress Notes (Signed)
Pre visit review using our clinic review tool, if applicable. No additional management support is needed unless otherwise documented below in the visit note.  Patient presents in clinic today for B12 injection. IM given in Right Deltoid. Patient tolerated injection well.  She has requested that her next injection be given during her 3 month follow-up with PCP on 08/16/16 at 2:00 PM.

## 2016-08-05 ENCOUNTER — Other Ambulatory Visit: Payer: Self-pay | Admitting: Family Medicine

## 2016-08-06 NOTE — Telephone Encounter (Signed)
She got a 90 day supply of this in June. She should not be out yet- has she changed how she is using the gabapentin?

## 2016-08-07 NOTE — Telephone Encounter (Signed)
Called left a detailed message of denial reason and to call back if have questions/concerns.

## 2016-08-08 DIAGNOSIS — E119 Type 2 diabetes mellitus without complications: Secondary | ICD-10-CM | POA: Diagnosis not present

## 2016-08-16 ENCOUNTER — Encounter: Payer: Self-pay | Admitting: Family Medicine

## 2016-08-16 ENCOUNTER — Ambulatory Visit (INDEPENDENT_AMBULATORY_CARE_PROVIDER_SITE_OTHER): Payer: Medicare Other | Admitting: Family Medicine

## 2016-08-16 VITALS — BP 113/54 | HR 71 | Temp 97.9°F | Ht 66.0 in | Wt 144.0 lb

## 2016-08-16 DIAGNOSIS — E1142 Type 2 diabetes mellitus with diabetic polyneuropathy: Secondary | ICD-10-CM

## 2016-08-16 DIAGNOSIS — N189 Chronic kidney disease, unspecified: Secondary | ICD-10-CM

## 2016-08-16 DIAGNOSIS — E1149 Type 2 diabetes mellitus with other diabetic neurological complication: Secondary | ICD-10-CM

## 2016-08-16 DIAGNOSIS — E538 Deficiency of other specified B group vitamins: Secondary | ICD-10-CM | POA: Diagnosis not present

## 2016-08-16 DIAGNOSIS — R000 Tachycardia, unspecified: Secondary | ICD-10-CM | POA: Diagnosis not present

## 2016-08-16 DIAGNOSIS — E782 Mixed hyperlipidemia: Secondary | ICD-10-CM | POA: Diagnosis not present

## 2016-08-16 DIAGNOSIS — I10 Essential (primary) hypertension: Secondary | ICD-10-CM

## 2016-08-16 DIAGNOSIS — K219 Gastro-esophageal reflux disease without esophagitis: Secondary | ICD-10-CM

## 2016-08-16 DIAGNOSIS — M8580 Other specified disorders of bone density and structure, unspecified site: Secondary | ICD-10-CM

## 2016-08-16 DIAGNOSIS — F411 Generalized anxiety disorder: Secondary | ICD-10-CM

## 2016-08-16 DIAGNOSIS — G629 Polyneuropathy, unspecified: Secondary | ICD-10-CM

## 2016-08-16 DIAGNOSIS — E559 Vitamin D deficiency, unspecified: Secondary | ICD-10-CM | POA: Diagnosis not present

## 2016-08-16 DIAGNOSIS — E785 Hyperlipidemia, unspecified: Secondary | ICD-10-CM

## 2016-08-16 MED ORDER — HYDROCODONE-ACETAMINOPHEN 5-325 MG PO TABS: 1.0000 | ORAL_TABLET | Freq: Four times a day (QID) | ORAL | 0 refills | Status: DC | PRN

## 2016-08-16 MED ORDER — CYANOCOBALAMIN 1000 MCG/ML IJ SOLN
1000.0000 ug | Freq: Once | INTRAMUSCULAR | Status: AC
  Administered 2016-08-16: 1000 ug via INTRAMUSCULAR

## 2016-08-16 MED ORDER — GABAPENTIN 800 MG PO TABS: 800.0000 mg | ORAL_TABLET | Freq: Three times a day (TID) | ORAL | 4 refills | Status: DC

## 2016-08-16 MED ORDER — TRIAMCINOLONE ACETONIDE 0.1 % EX CREA: 1.0000 "application " | TOPICAL_CREAM | Freq: Two times a day (BID) | CUTANEOUS | 1 refills | Status: DC | PRN

## 2016-08-16 MED ORDER — MUPIROCIN 2 % EX OINT: 1.0000 "application " | TOPICAL_OINTMENT | Freq: Every day | CUTANEOUS | 1 refills | Status: DC

## 2016-08-16 NOTE — Assessment & Plan Note (Signed)
Encouraged to get adequate exercise, calcium and vitamin d intake 

## 2016-08-16 NOTE — Assessment & Plan Note (Signed)
RRR today 

## 2016-08-16 NOTE — Assessment & Plan Note (Signed)
hgba1c acceptable, minimize simple carbs. Increase exercise as tolerated. Continue current meds 

## 2016-08-16 NOTE — Progress Notes (Signed)
Pre visit review using our clinic review tool, if applicable. No additional management support is needed unless otherwise documented below in the visit note. 

## 2016-08-16 NOTE — Patient Instructions (Signed)

## 2016-08-17 ENCOUNTER — Other Ambulatory Visit: Payer: Self-pay | Admitting: Family Medicine

## 2016-08-17 DIAGNOSIS — E875 Hyperkalemia: Secondary | ICD-10-CM

## 2016-08-17 LAB — COMPREHENSIVE METABOLIC PANEL
ALT: 9 U/L (ref 0–35)
AST: 15 U/L (ref 0–37)
Albumin: 4.2 g/dL (ref 3.5–5.2)
Alkaline Phosphatase: 54 U/L (ref 39–117)
BUN: 24 mg/dL — AB (ref 6–23)
CHLORIDE: 103 meq/L (ref 96–112)
CO2: 31 meq/L (ref 19–32)
Calcium: 9.7 mg/dL (ref 8.4–10.5)
Creatinine, Ser: 1.47 mg/dL — ABNORMAL HIGH (ref 0.40–1.20)
GFR: 35.62 mL/min — ABNORMAL LOW (ref >60.00)
GLUCOSE: 90 mg/dL (ref 70–99)
POTASSIUM: 5.6 meq/L — AB (ref 3.5–5.1)
SODIUM: 139 meq/L (ref 135–145)
Total Bilirubin: 0.3 mg/dL (ref 0.2–1.2)
Total Protein: 6.9 g/dL (ref 6.0–8.3)

## 2016-08-17 LAB — LIPID PANEL
Cholesterol: 149 mg/dL (ref 0–200)
HDL: 43.2 mg/dL (ref >39.00)
LDL CALC: 79 mg/dL (ref 0–99)
NONHDL: 106.07
Total CHOL/HDL Ratio: 3
Triglycerides: 135 mg/dL (ref 0.0–149.0)
VLDL: 27 mg/dL (ref 0.0–40.0)

## 2016-08-17 LAB — CBC
HEMATOCRIT: 37.4 % (ref 36.0–46.0)
Hemoglobin: 12.3 g/dL (ref 12.0–15.0)
MCHC: 32.9 g/dL (ref 30.0–36.0)
MCV: 84.9 fl (ref 78.0–100.0)
Platelets: 228 10*3/uL (ref 150.0–400.0)
RBC: 4.4 Mil/uL (ref 3.87–5.11)
RDW: 15.5 % (ref 11.5–15.5)
WBC: 5.9 10*3/uL (ref 4.0–10.5)

## 2016-08-17 LAB — VITAMIN D 25 HYDROXY (VIT D DEFICIENCY, FRACTURES): VITD: 67.84 ng/mL (ref 30.00–100.00)

## 2016-08-17 LAB — HEMOGLOBIN A1C: Hgb A1c MFr Bld: 6.5 % (ref 4.6–6.5)

## 2016-08-17 LAB — VITAMIN B12

## 2016-08-17 LAB — TSH: TSH: 2.23 u[IU]/mL (ref 0.35–4.50)

## 2016-08-19 DIAGNOSIS — G629 Polyneuropathy, unspecified: Secondary | ICD-10-CM | POA: Insufficient documentation

## 2016-08-19 NOTE — Assessment & Plan Note (Signed)
Tolerating statin, encouraged heart healthy diet, avoid trans fats, minimize simple carbs and saturated fats. Increase exercise as tolerated 

## 2016-08-19 NOTE — Assessment & Plan Note (Signed)
Given shot today 

## 2016-08-19 NOTE — Assessment & Plan Note (Signed)
Manageable with Gabapentin. Continue same

## 2016-08-19 NOTE — Assessment & Plan Note (Signed)
May use Diazepam prn

## 2016-08-19 NOTE — Assessment & Plan Note (Signed)
Avoid offending foods, start probiotics. Do not eat large meals in late evening and consider raising head of bed. Doing well on Patoprazole

## 2016-08-19 NOTE — Progress Notes (Signed)
Patient ID: Dana Burns, female   DOB: [birth year], 82 y.o.   MRN: [id number]   Subjective:    Patient ID: Dana Burns, female    DOB: 10/02/1926, 82 y.o.   MRN: [id number]  Chief Complaint  Patient presents with  . Follow-up    HPI Patient is in today for follow up. She feels well today. No recent illness or hospitalization. No polyuria or polydipsia. Is noting some intermittent back and hip pain no falls or injury. Denies CP/palp/SOB/HA/congestion/fevers/GI or GU c/o. Taking meds as prescribed  Past Medical History:  Diagnosis Date  . Allergy    sneeze, rhinorrhea in am  . Chicken pox as a child  . Chronic pain syndrome 01/23/2015  . Colon cancer ([place])   . Complication of anesthesia    agitated when waking up, wakes up shaking, "like  I'm going into shock"  . Depression    husband died  3 months ago, pt. admits depression  currently   . Diabetes mellitus    6 years  . DM (diabetes mellitus) type 2, uncontrolled, with ketoacidosis ([place]) 03/17/2011  . Fibromyalgia   . GERD (gastroesophageal reflux disease)   . H/O echocardiogram    done /w [place] in 2011, saw Dr. Percival Spanish for tachycardia   . Headache(784.0)    occasional - "bad headaches"  . Hyperlipidemia    6 years  . Hypertension    15 years, reports she has a rapid heartrate   . Measles as a child  . Osteoarthritis    in hands & all over, feet  . Pain of right shoulder region 07/07/2014  . Peripheral neuropathy ([place])   . Shortness of breath   . Type 2 diabetes mellitus with peripheral neuropathy ([place]) 03/17/2011    Past Surgical History:  Procedure Laterality Date  . BREAST SURGERY  2013   right- benign  . CATARACT EXTRACTION     /w IOL- bilateral   . COLON SURGERY     For resection of cancer  . Nodule removed     From throat    Family History  Problem Relation Age of Onset  . Cancer Mother     Brain  . Cancer Father     Colorectal  . Heart disease Neg Hx     Early  . Cancer Sister     breast  .  Other Brother     pacemaker  . Cancer Brother     pancreatic cancer  . Arthritis Sister   . Emphysema Brother   . Cancer Brother   . COPD Brother   . Neuropathy Daughter   . Fibromyalgia Daughter     Social History   Social History  . Marital status: Widowed    Spouse name: N/A  . Number of children: 1  . Years of education: 9   Occupational History  . Retired    Social History Main Topics  . Smoking status: Never Smoker  . Smokeless tobacco: Never Used  . Alcohol use No  . Drug use: No  . Sexual activity: No     Comment: lives with daughter. no dietary restrictions   Other Topics Concern  . Not on file   Social History Narrative   Patient lives at home with daughter.    Patient is retired.    Patient is widowed.    Patient has 1 child.    Patient has a 9th grade education.     Outpatient Medications Prior to Visit  Medication  Sig Dispense Refill  . acetaminophen (TYLENOL) 500 MG tablet Take 1,000 mg by mouth 2 (two) times daily.    . calcium carbonate (OS-CAL) 600 MG TABS Take 600 mg by mouth 2 (two) times daily with a meal.      . Cholecalciferol (VITAMIN D3) 5000 UNITS CAPS Take 5,000 Units by mouth daily.    . citalopram (CELEXA) 10 MG tablet TAKE 1 TABLET (10 MG TOTAL) BY MOUTH DAILY. 30 tablet 6  . diazepam (VALIUM) 5 MG tablet TAKE 1 AND A HALF TABLETS BY MOUTH TWICE A DAY AS NEEDED 45 tablet 1  . HEMATINIC/FOLIC ACID 99991111 MG TABS TAKE 1 TABLET BY MOUTH DAILY. 30 each 3  . metFORMIN (GLUCOPHAGE) 500 MG tablet Take 1 tablet (500 mg total) by mouth once. 30 tablet 2  . pantoprazole (PROTONIX) 40 MG tablet TAKE 1 TABLET (40 MG TOTAL) BY MOUTH DAILY. 30 tablet 3  . pravastatin (PRAVACHOL) 40 MG tablet TAKE 1 TABLET (40 MG TOTAL) BY MOUTH AT BEDTIME. 30 tablet 4  . valsartan-hydrochlorothiazide (DIOVAN-HCT) 160-12.5 MG tablet TAKE 1 TABLET BY MOUTH DAILY. 30 tablet 6  . gabapentin (NEURONTIN) 800 MG tablet TAKE 1 TABLET (800 MG TOTAL) BY MOUTH 3 (THREE) TIMES  DAILY. 90 tablet 1  . HYDROcodone-acetaminophen (NORCO/VICODIN) 5-325 MG tablet Take 1 tablet by mouth every 6 (six) hours as needed for moderate pain. 60 tablet 0   Facility-Administered Medications Prior to Visit  Medication Dose Route Frequency Provider Last Rate Last Dose  . cyanocobalamin ((VITAMIN B-12)) injection 1,000 mcg  1,000 mcg Intramuscular Once Mosie Lukes, MD        Allergies  Allergen Reactions  . Tape Itching and Rash  . Actos [Pioglitazone Hydrochloride]   . Buspar [Buspirone Hcl]   . Lipitor [Atorvastatin Calcium]   . Penicillins Rash    Review of Systems  Constitutional: Positive for malaise/fatigue. Negative for fever.  HENT: Negative for congestion.   Eyes: Negative for blurred vision.  Respiratory: Negative for shortness of breath.   Cardiovascular: Negative for chest pain, palpitations and leg swelling.  Gastrointestinal: Negative for abdominal pain, blood in stool and nausea.  Genitourinary: Negative for dysuria and frequency.  Musculoskeletal: Positive for joint pain. Negative for falls.  Skin: Negative for rash.  Neurological: Negative for dizziness, loss of consciousness and headaches.  Endo/Heme/Allergies: Negative for environmental allergies.  Psychiatric/Behavioral: Negative for depression. The patient is not nervous/anxious.        Objective:    Physical Exam  Constitutional: She is oriented to person, place, and time. She appears well-developed and well-nourished. No distress.  HENT:  Head: Normocephalic and atraumatic.  Nose: Nose normal.  Eyes: Right eye exhibits no discharge. Left eye exhibits no discharge.  Neck: Normal range of motion. Neck supple.  Cardiovascular: Normal rate and regular rhythm.   Pulmonary/Chest: Effort normal and breath sounds normal.  Abdominal: Soft. Bowel sounds are normal. There is no tenderness.  Musculoskeletal: She exhibits no edema.  Neurological: She is alert and oriented to person, place, and time.    Skin: Skin is warm and dry.  Psychiatric: She has a normal mood and affect.  Nursing note and vitals reviewed.   BP (!) 113/54 (BP Location: Left Arm, Patient Position: Sitting, Cuff Size: Normal)   Pulse 71   Temp 97.9 F (36.6 C) (Oral)   Ht 5\' 6"  (1.676 m)   Wt 144 lb (65.3 kg)   SpO2 97%   BMI 23.24 kg/m  Wt Readings from Last  3 Encounters:  08/16/16 144 lb (65.3 kg)  05/10/16 144 lb 8 oz (65.5 kg)  02/07/16 147 lb 2 oz (66.7 kg)     Lab Results  Component Value Date   WBC 5.9 08/16/2016   HGB 12.3 08/16/2016   HCT 37.4 08/16/2016   PLT 228.0 08/16/2016   GLUCOSE 90 08/16/2016   CHOL 149 08/16/2016   TRIG 135.0 08/16/2016   HDL 43.20 08/16/2016   LDLCALC 79 08/16/2016   ALT 9 08/16/2016   AST 15 08/16/2016   NA 139 08/16/2016   K 5.6 (H) 08/16/2016   CL 103 08/16/2016   CREATININE 1.47 (H) 08/16/2016   BUN 24 (H) 08/16/2016   CO2 31 08/16/2016   TSH 2.23 08/16/2016   HGBA1C 6.5 08/16/2016   MICROALBUR <0.7 02/07/2016    Lab Results  Component Value Date   TSH 2.23 08/16/2016   Lab Results  Component Value Date   WBC 5.9 08/16/2016   HGB 12.3 08/16/2016   HCT 37.4 08/16/2016   MCV 84.9 08/16/2016   PLT 228.0 08/16/2016   Lab Results  Component Value Date   NA 139 08/16/2016   K 5.6 (H) 08/16/2016   CO2 31 08/16/2016   GLUCOSE 90 08/16/2016   BUN 24 (H) 08/16/2016   CREATININE 1.47 (H) 08/16/2016   BILITOT 0.3 08/16/2016   ALKPHOS 54 08/16/2016   AST 15 08/16/2016   ALT 9 08/16/2016   PROT 6.9 08/16/2016   ALBUMIN 4.2 08/16/2016   CALCIUM 9.7 08/16/2016   GFR 35.62 (L) 08/16/2016   Lab Results  Component Value Date   CHOL 149 08/16/2016   Lab Results  Component Value Date   HDL 43.20 08/16/2016   Lab Results  Component Value Date   LDLCALC 79 08/16/2016   Lab Results  Component Value Date   TRIG 135.0 08/16/2016   Lab Results  Component Value Date   CHOLHDL 3 08/16/2016   Lab Results  Component Value Date   HGBA1C 6.5  08/16/2016       Assessment & Plan:   Problem List Items Addressed This Visit    Essential hypertension, benign    Well controlled, no changes to meds. Encouraged heart healthy diet such as the DASH diet and exercise as tolerated.       Relevant Medications   HYDROcodone-acetaminophen (NORCO/VICODIN) 5-325 MG tablet   Other Relevant Orders   TSH (Completed)   Tachycardia    RRR today      Relevant Medications   HYDROcodone-acetaminophen (NORCO/VICODIN) 5-325 MG tablet   Other Relevant Orders   CBC (Completed)   Comprehensive metabolic panel (Completed)   Type 2 diabetes mellitus with peripheral neuropathy (HCC)    hgba1c acceptable, minimize simple carbs. Increase exercise as tolerated. Continue current meds      Relevant Medications   HYDROcodone-acetaminophen (NORCO/VICODIN) 5-325 MG tablet   gabapentin (NEURONTIN) 800 MG tablet   Other Relevant Orders   Comprehensive metabolic panel (Completed)   Hemoglobin A1c (Completed)   Diabetic neuropathy (HCC)    Manageable with Gabapentin. Continue same      CKD (chronic kidney disease)   Relevant Medications   HYDROcodone-acetaminophen (NORCO/VICODIN) 5-325 MG tablet   GERD (gastroesophageal reflux disease)    Avoid offending foods, start probiotics. Do not eat large meals in late evening and consider raising head of bed. Doing well on Patoprazole       Relevant Medications   HYDROcodone-acetaminophen (NORCO/VICODIN) 5-325 MG tablet   Hyperlipidemia, mixed  Tolerating statin, encouraged heart healthy diet, avoid trans fats, minimize simple carbs and saturated fats. Increase exercise as tolerated      Relevant Medications   HYDROcodone-acetaminophen (NORCO/VICODIN) 5-325 MG tablet   Other Relevant Orders   Lipid panel (Completed)   Anxiety state    May use Diazepam prn      Osteopenia    Encouraged to get adequate exercise, calcium and vitamin d intake      Vitamin B 12 deficiency    Given shot today        Relevant Medications   HYDROcodone-acetaminophen (NORCO/VICODIN) 5-325 MG tablet   cyanocobalamin ((VITAMIN B-12)) injection 1,000 mcg (Completed)   Other Relevant Orders   CBC (Completed)   Vitamin B12 (Completed)   RESOLVED: Neuropathy (HCC)   Relevant Medications   HYDROcodone-acetaminophen (NORCO/VICODIN) 5-325 MG tablet    Other Visit Diagnoses    Vitamin D deficiency    -  Primary   Relevant Orders   VITAMIN D 25 Hydroxy (Vit-D Deficiency, Fractures) (Completed)      I am having Ms. Behanna start on mupirocin ointment and triamcinolone cream. I am also having her maintain her calcium carbonate, Vitamin D3, acetaminophen, pravastatin, diazepam, valsartan-hydrochlorothiazide, metFORMIN, citalopram, pantoprazole, HEMATINIC/FOLIC ACID, HYDROcodone-acetaminophen, and gabapentin. We administered cyanocobalamin. We will continue to administer cyanocobalamin.  Meds ordered this encounter  Medications  . DISCONTD: HYDROcodone-acetaminophen (NORCO/VICODIN) 5-325 MG tablet    Sig: Take 1 tablet by mouth every 6 (six) hours as needed for moderate pain.    Dispense:  60 tablet    Refill:  0  . DISCONTD: gabapentin (NEURONTIN) 800 MG tablet    Sig: Take 1 tablet (800 mg total) by mouth 3 (three) times daily.    Dispense:  90 tablet    Refill:  4  . mupirocin ointment (BACTROBAN) 2 %    Sig: Place 1 application into the nose daily.    Dispense:  22 g    Refill:  1  . triamcinolone cream (KENALOG) 0.1 %    Sig: Apply 1 application topically 2 (two) times daily as needed.    Dispense:  45 g    Refill:  1  . DISCONTD: gabapentin (NEURONTIN) 800 MG tablet    Sig: Take 1 tablet (800 mg total) by mouth 3 (three) times daily.    Dispense:  90 tablet    Refill:  4  . HYDROcodone-acetaminophen (NORCO/VICODIN) 5-325 MG tablet    Sig: Take 1 tablet by mouth every 6 (six) hours as needed for moderate pain.    Dispense:  60 tablet    Refill:  0  . cyanocobalamin ((VITAMIN B-12)) injection  1,000 mcg  . gabapentin (NEURONTIN) 800 MG tablet    Sig: Take 1 tablet (800 mg total) by mouth 3 (three) times daily.    Dispense:  90 tablet    Refill:  4     Penni Homans, MD

## 2016-08-19 NOTE — Assessment & Plan Note (Signed)
Well controlled, no changes to meds. Encouraged heart healthy diet such as the DASH diet and exercise as tolerated.  °

## 2016-08-20 ENCOUNTER — Other Ambulatory Visit (INDEPENDENT_AMBULATORY_CARE_PROVIDER_SITE_OTHER): Payer: Medicare Other

## 2016-08-20 DIAGNOSIS — E875 Hyperkalemia: Secondary | ICD-10-CM

## 2016-08-20 LAB — COMPREHENSIVE METABOLIC PANEL
ALBUMIN: 4.1 g/dL (ref 3.5–5.2)
ALT: 9 U/L (ref 0–35)
AST: 14 U/L (ref 0–37)
Alkaline Phosphatase: 59 U/L (ref 39–117)
BILIRUBIN TOTAL: 0.3 mg/dL (ref 0.2–1.2)
BUN: 32 mg/dL — AB (ref 6–23)
CALCIUM: 9.3 mg/dL (ref 8.4–10.5)
CHLORIDE: 103 meq/L (ref 96–112)
CO2: 29 meq/L (ref 19–32)
CREATININE: 1.45 mg/dL — AB (ref 0.40–1.20)
GFR: 36.19 mL/min — ABNORMAL LOW (ref >60.00)
Glucose, Bld: 90 mg/dL (ref 70–99)
Potassium: 5.2 mEq/L — ABNORMAL HIGH (ref 3.5–5.1)
SODIUM: 139 meq/L (ref 135–145)
Total Protein: 6.9 g/dL (ref 6.0–8.3)

## 2016-08-21 ENCOUNTER — Other Ambulatory Visit: Payer: Self-pay | Admitting: Family Medicine

## 2016-08-21 DIAGNOSIS — E875 Hyperkalemia: Secondary | ICD-10-CM

## 2016-09-03 ENCOUNTER — Other Ambulatory Visit: Payer: Self-pay | Admitting: Family Medicine

## 2016-09-04 NOTE — Telephone Encounter (Signed)
Faxed hardcopy to [facility] for Valium

## 2016-09-11 ENCOUNTER — Ambulatory Visit: Payer: Self-pay

## 2016-09-11 ENCOUNTER — Other Ambulatory Visit: Payer: Self-pay

## 2016-09-14 ENCOUNTER — Ambulatory Visit (INDEPENDENT_AMBULATORY_CARE_PROVIDER_SITE_OTHER): Payer: Medicare Other

## 2016-09-14 ENCOUNTER — Other Ambulatory Visit (INDEPENDENT_AMBULATORY_CARE_PROVIDER_SITE_OTHER): Payer: Medicare Other

## 2016-09-14 DIAGNOSIS — E538 Deficiency of other specified B group vitamins: Secondary | ICD-10-CM | POA: Diagnosis not present

## 2016-09-14 DIAGNOSIS — E875 Hyperkalemia: Secondary | ICD-10-CM

## 2016-09-14 LAB — COMPREHENSIVE METABOLIC PANEL
ALT: 8 U/L (ref 6–29)
AST: 16 U/L (ref 10–35)
Albumin: 3.9 g/dL (ref 3.6–5.1)
Alkaline Phosphatase: 52 U/L (ref 33–130)
BILIRUBIN TOTAL: 0.3 mg/dL (ref 0.2–1.2)
BUN: 22 mg/dL (ref 7–25)
CALCIUM: 9.1 mg/dL (ref 8.6–10.4)
CHLORIDE: 104 mmol/L (ref 98–110)
CO2: 28 mmol/L (ref 20–31)
CREATININE: 1.47 mg/dL — AB (ref 0.60–0.88)
Glucose, Bld: 110 mg/dL — ABNORMAL HIGH (ref 65–99)
POTASSIUM: 5.2 mmol/L (ref 3.5–5.3)
Sodium: 140 mmol/L (ref 135–146)
TOTAL PROTEIN: 6.3 g/dL (ref 6.1–8.1)

## 2016-09-14 MED ORDER — CYANOCOBALAMIN 1000 MCG/ML IJ SOLN
1000.0000 ug | Freq: Once | INTRAMUSCULAR | Status: AC
  Administered 2016-09-14: 1000 ug via INTRAMUSCULAR

## 2016-09-14 NOTE — Progress Notes (Signed)
Pre visit review using our clinic review tool, if applicable. No additional management support is needed unless otherwise documented below in the visit note.  Patient in clinic today for B12 injection. IM given in Left Deltoid. Patient tolerated injection well.  No signs of reaction upon leaving the clinic.  Next B12 injection scheduled for 10/16/16 at 3 pm.

## 2016-09-14 NOTE — Addendum Note (Signed)
Addended by: Caffie Pinto on: 09/14/2016 02:42 PM   Modules accepted: Orders

## 2016-09-14 NOTE — Addendum Note (Signed)
Addended by: Caffie Pinto on: 09/14/2016 02:41 PM   Modules accepted: Orders

## 2016-09-22 ENCOUNTER — Other Ambulatory Visit: Payer: Self-pay | Admitting: Family Medicine

## 2016-09-23 ENCOUNTER — Other Ambulatory Visit: Payer: Self-pay | Admitting: Family Medicine

## 2016-10-01 ENCOUNTER — Other Ambulatory Visit: Payer: Self-pay | Admitting: Family Medicine

## 2016-10-16 ENCOUNTER — Ambulatory Visit (INDEPENDENT_AMBULATORY_CARE_PROVIDER_SITE_OTHER): Payer: Medicare Other | Admitting: *Deleted

## 2016-10-16 DIAGNOSIS — E538 Deficiency of other specified B group vitamins: Secondary | ICD-10-CM | POA: Diagnosis not present

## 2016-10-16 MED ORDER — CYANOCOBALAMIN 1000 MCG/ML IJ SOLN
1000.0000 ug | Freq: Once | INTRAMUSCULAR | Status: AC
  Administered 2016-10-16: 1000 ug via INTRAMUSCULAR

## 2016-10-16 MED ORDER — CYANOCOBALAMIN 1000 MCG/ML IJ SOLN: 1000.0000 ug | Freq: Once | INTRAMUSCULAR | 0 refills | Status: DC

## 2016-10-16 NOTE — Progress Notes (Signed)
Pre visit review using our clinic review tool, if applicable. No additional management support is needed unless otherwise documented below in the visit note.  Patient tolerated injection well.  Next appointment: 11/13/16 w/ PCP  Dorrene German, RN

## 2016-11-08 DIAGNOSIS — E119 Type 2 diabetes mellitus without complications: Secondary | ICD-10-CM | POA: Diagnosis not present

## 2016-11-13 ENCOUNTER — Ambulatory Visit (HOSPITAL_BASED_OUTPATIENT_CLINIC_OR_DEPARTMENT_OTHER)
Admission: RE | Admit: 2016-11-13 | Discharge: 2016-11-13 | Disposition: A | Discharge status: Home / Self Care | Payer: Medicare Other | Source: Ambulatory Visit | Attending: Family Medicine | Admitting: Family Medicine

## 2016-11-13 ENCOUNTER — Encounter: Payer: Self-pay | Admitting: Family Medicine

## 2016-11-13 ENCOUNTER — Ambulatory Visit (INDEPENDENT_AMBULATORY_CARE_PROVIDER_SITE_OTHER): Payer: Medicare Other | Admitting: Family Medicine

## 2016-11-13 VITALS — BP 104/62 | HR 79 | Temp 98.0°F | Ht 66.0 in | Wt 145.5 lb

## 2016-11-13 DIAGNOSIS — M25511 Pain in right shoulder: Secondary | ICD-10-CM | POA: Insufficient documentation

## 2016-11-13 DIAGNOSIS — R000 Tachycardia, unspecified: Secondary | ICD-10-CM

## 2016-11-13 DIAGNOSIS — Z23 Encounter for immunization: Secondary | ICD-10-CM

## 2016-11-13 DIAGNOSIS — K219 Gastro-esophageal reflux disease without esophagitis: Secondary | ICD-10-CM

## 2016-11-13 DIAGNOSIS — E782 Mixed hyperlipidemia: Secondary | ICD-10-CM

## 2016-11-13 DIAGNOSIS — I10 Essential (primary) hypertension: Secondary | ICD-10-CM

## 2016-11-13 DIAGNOSIS — E538 Deficiency of other specified B group vitamins: Secondary | ICD-10-CM

## 2016-11-13 DIAGNOSIS — M8580 Other specified disorders of bone density and structure, unspecified site: Secondary | ICD-10-CM

## 2016-11-13 DIAGNOSIS — E1149 Type 2 diabetes mellitus with other diabetic neurological complication: Secondary | ICD-10-CM

## 2016-11-13 DIAGNOSIS — S4991XA Unspecified injury of right shoulder and upper arm, initial encounter: Secondary | ICD-10-CM | POA: Diagnosis not present

## 2016-11-13 DIAGNOSIS — E1142 Type 2 diabetes mellitus with diabetic polyneuropathy: Secondary | ICD-10-CM

## 2016-11-13 DIAGNOSIS — G629 Polyneuropathy, unspecified: Secondary | ICD-10-CM

## 2016-11-13 LAB — COMPLETE METABOLIC PANEL WITH GFR
ALT: 8 U/L (ref 6–29)
AST: 15 U/L (ref 10–35)
Albumin: 4.2 g/dL (ref 3.6–5.1)
Alkaline Phosphatase: 62 U/L (ref 33–130)
BILIRUBIN TOTAL: 0.4 mg/dL (ref 0.2–1.2)
BUN: 24 mg/dL (ref 7–25)
CHLORIDE: 102 mmol/L (ref 98–110)
CO2: 31 mmol/L (ref 20–31)
CREATININE: 1.44 mg/dL — AB (ref 0.60–0.88)
Calcium: 9.6 mg/dL (ref 8.6–10.4)
GFR, EST NON AFRICAN AMERICAN: 32 mL/min — AB (ref >=60)
GFR, Est African American: 37 mL/min — ABNORMAL LOW (ref >=60)
GLUCOSE: 92 mg/dL (ref 65–99)
Potassium: 5.2 mmol/L (ref 3.5–5.3)
SODIUM: 140 mmol/L (ref 135–146)
TOTAL PROTEIN: 6.8 g/dL (ref 6.1–8.1)

## 2016-11-13 LAB — CBC
HCT: 38 % (ref 36.0–46.0)
Hemoglobin: 12.4 g/dL (ref 12.0–15.0)
MCHC: 32.6 g/dL (ref 30.0–36.0)
MCV: 84.5 fl (ref 78.0–100.0)
Platelets: 240 10*3/uL (ref 150.0–400.0)
RBC: 4.49 Mil/uL (ref 3.87–5.11)
RDW: 15.3 % (ref 11.5–15.5)
WBC: 6.7 10*3/uL (ref 4.0–10.5)

## 2016-11-13 MED ORDER — DIAZEPAM 5 MG PO TABS: ORAL_TABLET | ORAL | 2 refills | Status: DC

## 2016-11-13 MED ORDER — HYDROCODONE-ACETAMINOPHEN 5-325 MG PO TABS: 1.0000 | ORAL_TABLET | Freq: Four times a day (QID) | ORAL | 0 refills | Status: DC | PRN

## 2016-11-13 MED ORDER — CYANOCOBALAMIN 1000 MCG/ML IJ SOLN
1000.0000 ug | Freq: Once | INTRAMUSCULAR | Status: AC
  Administered 2016-11-13: 1000 ug via INTRAMUSCULAR

## 2016-11-13 NOTE — Patient Instructions (Signed)
Shoulder Pain Many things can cause shoulder pain, including:  An injury to the area.  Overuse of the shoulder.  Arthritis. The source of the pain can be:  Inflammation.  An injury to the shoulder joint.  An injury to a tendon, ligament, or bone. Follow these instructions at home: Take these actions to help with your pain:  Squeeze a soft ball or a foam pad as much as possible. This helps to keep the shoulder from swelling. It also helps to strengthen the arm.  Take over-the-counter and prescription medicines only as told by your health care provider.  If directed, apply ice to the area:  Put ice in a plastic bag.  Place a towel between your skin and the bag.  Leave the ice on for 20 minutes, 2-3 times per day. Stop applying ice if it does not help with the pain.  If you were given a shoulder sling or immobilizer:  Wear it as told.  Remove it to shower or bathe.  Move your arm as little as possible, but keep your hand moving to prevent swelling. Contact a health care provider if:  Your pain gets worse.  Your pain is not relieved with medicines.  New pain develops in your arm, hand, or fingers. Get help right away if:  Your arm, hand, or fingers:  Tingle.  Become numb.  Become swollen.  Become painful.  Turn white or blue. This information is not intended to replace advice given to you by your health care provider. Make sure you discuss any questions you have with your health care provider. Document Released: 09/26/2005 Document Revised: 08/12/2016 Document Reviewed: 04/11/2015 Elsevier Interactive Patient Education  2017 [employer]  

## 2016-11-13 NOTE — Assessment & Plan Note (Signed)
Well controlled, no changes to meds. Encouraged heart healthy diet such as the DASH diet and exercise as tolerated.  °

## 2016-11-13 NOTE — Assessment & Plan Note (Signed)
hgba1c acceptable, minimize simple carbs. Increase exercise as tolerated. Continue current meds 

## 2016-11-13 NOTE — Progress Notes (Signed)
Pre visit review using our clinic review tool, if applicable. No additional management support is needed unless otherwise documented below in the visit note. 

## 2016-11-26 NOTE — Assessment & Plan Note (Signed)
Encouraged to get adequate exercise, calcium and vitamin d intake 

## 2016-11-26 NOTE — Assessment & Plan Note (Addendum)
Tripped over her recliner last week and fell on her shoulder. It is improving but slowly. Encouraged topical treatments such as Aspercreme and report if does not continue to improve. Xray negative for fracture

## 2016-11-26 NOTE — Assessment & Plan Note (Signed)
Given B12 injection today. °

## 2016-11-26 NOTE — Assessment & Plan Note (Signed)
hgba1c acceptable, minimize simple carbs. Increase exercise as tolerated. Continue current meds 

## 2016-11-26 NOTE — Progress Notes (Signed)
Patient ID: Dana Burns, female   DOB: 09-11-1926, 82 y.o.   MRN: [id number]   Subjective:    Patient ID: Dana Burns, female    DOB: 01-Sep-1926, 82 y.o.   MRN: [id number]  Chief Complaint  Patient presents with  . Follow-up    HPI Patient is in today for follow up. Notes she suffered a fall in her home last week when she tripped over her recliner and fell on her right shoulder. It is improving but still hurts. No radiculopathy down arm or weakness. No other acute concerns just fatigue. Denies CP/palp/SOB/HA/congestion/fevers/GI or GU c/o. Taking meds as prescribed  Past Medical History:  Diagnosis Date  . Allergy    sneeze, rhinorrhea in am  . Chicken pox as a child  . Chronic pain syndrome 01/23/2015  . Colon cancer ([place])   . Complication of anesthesia    agitated when waking up, wakes up shaking, "like  I'm going into shock"  . Depression    husband died  3 months ago, pt. admits depression  currently   . Diabetes mellitus    6 years  . DM (diabetes mellitus) type 2, uncontrolled, with ketoacidosis ([place]) 03/17/2011  . Fibromyalgia   . GERD (gastroesophageal reflux disease)   . H/O echocardiogram    done /w [place] in 2011, saw Dr. Percival Spanish for tachycardia   . Headache(784.0)    occasional - "bad headaches"  . Hyperlipidemia    6 years  . Hypertension    15 years, reports she has a rapid heartrate   . Measles as a child  . Osteoarthritis    in hands & all over, feet  . Pain of right shoulder region 07/07/2014  . Peripheral neuropathy ([place])   . Shortness of breath   . Type 2 diabetes mellitus with peripheral neuropathy ([place]) 03/17/2011    Past Surgical History:  Procedure Laterality Date  . BREAST SURGERY  2013   right- benign  . CATARACT EXTRACTION     /w IOL- bilateral   . COLON SURGERY     For resection of cancer  . Nodule removed     From throat    Family History  Problem Relation Age of Onset  . Cancer Mother     Brain  . Cancer Father     Colorectal    . Heart disease Neg Hx     Early  . Cancer Sister     breast  . Other Brother     pacemaker  . Cancer Brother     pancreatic cancer  . Arthritis Sister   . Emphysema Brother   . Cancer Brother   . COPD Brother   . Neuropathy Daughter   . Fibromyalgia Daughter     Social History   Social History  . Marital status: Widowed    Spouse name: N/A  . Number of children: 1  . Years of education: 9   Occupational History  . Retired    Social History Main Topics  . Smoking status: Never Smoker  . Smokeless tobacco: Never Used  . Alcohol use No  . Drug use: No  . Sexual activity: No     Comment: lives with daughter. no dietary restrictions   Other Topics Concern  . Not on file   Social History Narrative   Patient lives at home with daughter.    Patient is retired.    Patient is widowed.    Patient has 1 child.    Patient  has a 9th grade education.     Outpatient Medications Prior to Visit  Medication Sig Dispense Refill  . acetaminophen (TYLENOL) 500 MG tablet Take 1,000 mg by mouth 2 (two) times daily.    . calcium carbonate (OS-CAL) 600 MG TABS Take 600 mg by mouth 2 (two) times daily with a meal.      . Cholecalciferol (VITAMIN D3) 5000 UNITS CAPS Take 5,000 Units by mouth daily.    . citalopram (CELEXA) 10 MG tablet TAKE 1 TABLET (10 MG TOTAL) BY MOUTH DAILY. 30 tablet 6  . gabapentin (NEURONTIN) 800 MG tablet Take 1 tablet (800 mg total) by mouth 3 (three) times daily. 90 tablet 4  . metFORMIN (GLUCOPHAGE) 500 MG tablet Take 1 tablet (500 mg total) by mouth once. 30 tablet 2  . mupirocin ointment (BACTROBAN) 2 % Place 1 application into the nose daily. 22 g 1  . pantoprazole (PROTONIX) 40 MG tablet TAKE 1 TABLET (40 MG TOTAL) BY MOUTH DAILY. 30 tablet 3  . pravastatin (PRAVACHOL) 40 MG tablet TAKE 1 TABLET BY MOUTH AT BEDTIME. 30 tablet 4  . triamcinolone cream (KENALOG) 0.1 % Apply 1 application topically 2 (two) times daily as needed. 45 g 1  .  valsartan-hydrochlorothiazide (DIOVAN-HCT) 160-12.5 MG tablet TAKE 1 TABLET BY MOUTH DAILY. 30 tablet 6  . diazepam (VALIUM) 5 MG tablet TAKE 1 AND 1/2 TABLET BY MOUTH TWICE DAILY AS NEEDED 45 tablet 1  . HEMATINIC/FOLIC ACID 99991111 MG TABS TAKE 1 TABLET BY MOUTH DAILY. 30 each 3  . HYDROcodone-acetaminophen (NORCO/VICODIN) 5-325 MG tablet Take 1 tablet by mouth every 6 (six) hours as needed for moderate pain. 60 tablet 0  . gabapentin (NEURONTIN) 800 MG tablet TAKE 1 TABLET (800 MG TOTAL) BY MOUTH 3 (THREE) TIMES DAILY. 90 tablet 1   No facility-administered medications prior to visit.     Allergies  Allergen Reactions  . Tape Itching and Rash  . Actos [Pioglitazone Hydrochloride] Other (See Comments)    Unknown  . Buspar [Buspirone Hcl] Other (See Comments)    Unknown  . Lipitor [Atorvastatin Calcium] Other (See Comments)    "It made me hurt all over."  . Penicillins Rash    Review of Systems  Constitutional: Positive for malaise/fatigue. Negative for fever.  HENT: Negative for congestion.   Eyes: Negative for blurred vision.  Respiratory: Negative for shortness of breath.   Cardiovascular: Negative for chest pain, palpitations and leg swelling.  Gastrointestinal: Negative for abdominal pain, blood in stool and nausea.  Genitourinary: Negative for dysuria and frequency.  Musculoskeletal: Positive for joint pain. Negative for falls.  Skin: Negative for rash.  Neurological: Negative for dizziness, loss of consciousness and headaches.  Endo/Heme/Allergies: Negative for environmental allergies.  Psychiatric/Behavioral: Negative for depression. The patient is not nervous/anxious.        Objective:    Physical Exam  Constitutional: She is oriented to person, place, and time. She appears well-developed and well-nourished. No distress.  HENT:  Head: Normocephalic and atraumatic.  Nose: Nose normal.  Eyes: Right eye exhibits no discharge. Left eye exhibits no discharge.  Neck:  Normal range of motion. Neck supple.  Cardiovascular: Normal rate and regular rhythm.   Pulmonary/Chest: Effort normal and breath sounds normal.  Abdominal: Soft. Bowel sounds are normal. There is no tenderness.  Musculoskeletal: She exhibits no edema.  Neurological: She is alert and oriented to person, place, and time.  Skin: Skin is warm and dry.  Psychiatric: She has a normal mood  and affect.  Nursing note and vitals reviewed.   BP 104/62 (BP Location: Left Arm, Patient Position: Sitting, Cuff Size: Normal)   Pulse 79   Temp 98 F (36.7 C) (Oral)   Ht 5\' 6"  (1.676 m)   Wt 145 lb 8 oz (66 kg)   SpO2 97%   BMI 23.48 kg/m  Wt Readings from Last 3 Encounters:  11/13/16 145 lb 8 oz (66 kg)  08/16/16 144 lb (65.3 kg)  05/10/16 144 lb 8 oz (65.5 kg)     Lab Results  Component Value Date   WBC 6.7 11/13/2016   HGB 12.4 11/13/2016   HCT 38.0 11/13/2016   PLT 240.0 11/13/2016   GLUCOSE 92 11/13/2016   CHOL 149 08/16/2016   TRIG 135.0 08/16/2016   HDL 43.20 08/16/2016   LDLCALC 79 08/16/2016   ALT 8 11/13/2016   AST 15 11/13/2016   NA 140 11/13/2016   K 5.2 11/13/2016   CL 102 11/13/2016   CREATININE 1.44 (H) 11/13/2016   BUN 24 11/13/2016   CO2 31 11/13/2016   TSH 2.23 08/16/2016   HGBA1C 6.5 08/16/2016   MICROALBUR <0.7 02/07/2016    Lab Results  Component Value Date   TSH 2.23 08/16/2016   Lab Results  Component Value Date   WBC 6.7 11/13/2016   HGB 12.4 11/13/2016   HCT 38.0 11/13/2016   MCV 84.5 11/13/2016   PLT 240.0 11/13/2016   Lab Results  Component Value Date   NA 140 11/13/2016   K 5.2 11/13/2016   CO2 31 11/13/2016   GLUCOSE 92 11/13/2016   BUN 24 11/13/2016   CREATININE 1.44 (H) 11/13/2016   BILITOT 0.4 11/13/2016   ALKPHOS 62 11/13/2016   AST 15 11/13/2016   ALT 8 11/13/2016   PROT 6.8 11/13/2016   ALBUMIN 4.2 11/13/2016   CALCIUM 9.6 11/13/2016   GFR 36.19 (L) 08/20/2016   Lab Results  Component Value Date   CHOL 149 08/16/2016     Lab Results  Component Value Date   HDL 43.20 08/16/2016   Lab Results  Component Value Date   LDLCALC 79 08/16/2016   Lab Results  Component Value Date   TRIG 135.0 08/16/2016   Lab Results  Component Value Date   CHOLHDL 3 08/16/2016   Lab Results  Component Value Date   HGBA1C 6.5 08/16/2016       Assessment & Plan:   Problem List Items Addressed This Visit    Essential hypertension, benign    Well controlled, no changes to meds. Encouraged heart healthy diet such as the DASH diet and exercise as tolerated.       Relevant Medications   HYDROcodone-acetaminophen (NORCO/VICODIN) 5-325 MG tablet   Other Relevant Orders   CBC (Completed)   COMPLETE METABOLIC PANEL WITH GFR (Completed)   Tachycardia   Relevant Medications   HYDROcodone-acetaminophen (NORCO/VICODIN) 5-325 MG tablet   Type 2 diabetes mellitus with peripheral neuropathy (HCC)    hgba1c acceptable, minimize simple carbs. Increase exercise as tolerated. Continue current meds      Relevant Medications   diazepam (VALIUM) 5 MG tablet   HYDROcodone-acetaminophen (NORCO/VICODIN) 5-325 MG tablet   Diabetic neuropathy (HCC)    hgba1c acceptable, minimize simple carbs. Increase exercise as tolerated. Continue current meds      GERD (gastroesophageal reflux disease)   Relevant Medications   HYDROcodone-acetaminophen (NORCO/VICODIN) 5-325 MG tablet   Hyperlipidemia, mixed    Tolerating statin, encouraged heart healthy diet, avoid trans fats, minimize  simple carbs and saturated fats. Increase exercise as tolerated      Relevant Medications   HYDROcodone-acetaminophen (NORCO/VICODIN) 5-325 MG tablet   Osteopenia    Encouraged to get adequate exercise, calcium and vitamin d intake      Vitamin B 12 deficiency    Given B12 injection today      Relevant Medications   HYDROcodone-acetaminophen (NORCO/VICODIN) 5-325 MG tablet   cyanocobalamin ((VITAMIN B-12)) injection 1,000 mcg (Completed)   Pain of  right shoulder region    Tripped over her recliner last week and fell on her shoulder. It is improving but slowly. Encouraged topical treatments such as Aspercreme and report if does not continue to improve. Xray negative for fracture       Other Visit Diagnoses    Pain in joint of right shoulder    -  Primary   Relevant Orders   DG Shoulder Right (Completed)   Encounter for immunization       Relevant Medications   diazepam (VALIUM) 5 MG tablet   HYDROcodone-acetaminophen (NORCO/VICODIN) 5-325 MG tablet   Other Relevant Orders   Flu Vaccine QUAD 36+ mos IM (Completed)   DG Shoulder Right (Completed)   CBC (Completed)   COMPLETE METABOLIC PANEL WITH GFR (Completed)   Neuropathy (HCC)       Relevant Medications   HYDROcodone-acetaminophen (NORCO/VICODIN) 5-325 MG tablet      I have discontinued Ms. Keir's HEMATINIC/FOLIC ACID. I am also having her maintain her calcium carbonate, Vitamin D3, acetaminophen, metFORMIN, citalopram, mupirocin ointment, triamcinolone cream, gabapentin, pravastatin, valsartan-hydrochlorothiazide, pantoprazole, diazepam, and HYDROcodone-acetaminophen. We administered cyanocobalamin.  Meds ordered this encounter  Medications  . diazepam (VALIUM) 5 MG tablet    Sig: TAKE 1 AND 1/2 TABLET BY MOUTH TWICE DAILY AS NEEDED    Dispense:  45 tablet    Refill:  2    This request is for a new prescription for a controlled substance as required by Federal/State law.  Marland Kitchen HYDROcodone-acetaminophen (NORCO/VICODIN) 5-325 MG tablet    Sig: Take 1 tablet by mouth every 6 (six) hours as needed for moderate pain.    Dispense:  90 tablet    Refill:  0  . cyanocobalamin ((VITAMIN B-12)) injection 1,000 mcg     Penni Homans, MD

## 2016-11-26 NOTE — Assessment & Plan Note (Signed)
Tolerating statin, encouraged heart healthy diet, avoid trans fats, minimize simple carbs and saturated fats. Increase exercise as tolerated 

## 2016-11-27 ENCOUNTER — Other Ambulatory Visit: Payer: Self-pay | Admitting: Family Medicine

## 2016-11-27 MED ORDER — METFORMIN HCL 500 MG PO TABS: 500.0000 mg | ORAL_TABLET | Freq: Once | ORAL | 3 refills | Status: DC

## 2016-12-13 ENCOUNTER — Ambulatory Visit (INDEPENDENT_AMBULATORY_CARE_PROVIDER_SITE_OTHER): Payer: Medicare Other

## 2016-12-13 DIAGNOSIS — E538 Deficiency of other specified B group vitamins: Secondary | ICD-10-CM

## 2016-12-13 MED ORDER — CYANOCOBALAMIN 1000 MCG/ML IJ SOLN
1000.0000 ug | Freq: Once | INTRAMUSCULAR | Status: AC
  Administered 2016-12-13: 1000 ug via INTRAMUSCULAR

## 2016-12-13 NOTE — Progress Notes (Signed)
Pre visit review using our clinic tool,if applicable. No additional management support is needed unless otherwise documented below in the visit note.   Per order from Dr. Charlett Blake, patient in for B12 injection due to B12 deficiency. Given IM Left deltoid. Patient tolerated well. Return appointment given to patient.

## 2016-12-16 ENCOUNTER — Other Ambulatory Visit: Payer: Self-pay | Admitting: Family Medicine

## 2017-01-15 ENCOUNTER — Other Ambulatory Visit: Payer: Self-pay | Admitting: Family Medicine

## 2017-01-15 ENCOUNTER — Ambulatory Visit (INDEPENDENT_AMBULATORY_CARE_PROVIDER_SITE_OTHER): Payer: Medicare Other

## 2017-01-15 DIAGNOSIS — E538 Deficiency of other specified B group vitamins: Secondary | ICD-10-CM | POA: Diagnosis not present

## 2017-01-15 MED ORDER — PRAVASTATIN SODIUM 40 MG PO TABS: 40.0000 mg | ORAL_TABLET | Freq: Every day | ORAL | 1 refills | Status: DC

## 2017-01-15 MED ORDER — CITALOPRAM HYDROBROMIDE 10 MG PO TABS: 10.0000 mg | ORAL_TABLET | Freq: Every day | ORAL | 1 refills | Status: DC

## 2017-01-15 MED ORDER — CYANOCOBALAMIN 1000 MCG/ML IJ SOLN
1000.0000 ug | Freq: Once | INTRAMUSCULAR | Status: AC
  Administered 2017-01-15: 1000 ug via INTRAMUSCULAR

## 2017-01-18 ENCOUNTER — Other Ambulatory Visit: Payer: Self-pay

## 2017-01-18 MED ORDER — GABAPENTIN 800 MG PO TABS: 800.0000 mg | ORAL_TABLET | Freq: Three times a day (TID) | ORAL | 4 refills | Status: DC

## 2017-01-18 MED ORDER — PANTOPRAZOLE SODIUM 40 MG PO TBEC: DELAYED_RELEASE_TABLET | ORAL | 3 refills | Status: DC

## 2017-01-31 ENCOUNTER — Telehealth: Payer: Self-pay | Admitting: Family Medicine

## 2017-01-31 NOTE — Telephone Encounter (Signed)
Called patient to schedule awv appt. Left msg for patient to call office to schedule appt.

## 2017-02-08 ENCOUNTER — Other Ambulatory Visit: Payer: Self-pay

## 2017-02-08 MED ORDER — GABAPENTIN 800 MG PO TABS: 800.0000 mg | ORAL_TABLET | Freq: Three times a day (TID) | ORAL | 4 refills | Status: DC

## 2017-02-08 MED ORDER — PANTOPRAZOLE SODIUM 40 MG PO TBEC: DELAYED_RELEASE_TABLET | ORAL | 3 refills | Status: DC

## 2017-02-08 MED ORDER — CITALOPRAM HYDROBROMIDE 10 MG PO TABS: 10.0000 mg | ORAL_TABLET | Freq: Every day | ORAL | 1 refills | Status: DC

## 2017-02-11 ENCOUNTER — Ambulatory Visit (HOSPITAL_BASED_OUTPATIENT_CLINIC_OR_DEPARTMENT_OTHER)
Admission: RE | Admit: 2017-02-11 | Discharge: 2017-02-11 | Disposition: A | Discharge status: Home / Self Care | Payer: Medicare Other | Source: Ambulatory Visit | Attending: Family Medicine | Admitting: Family Medicine

## 2017-02-11 ENCOUNTER — Encounter: Payer: Self-pay | Admitting: Family Medicine

## 2017-02-11 ENCOUNTER — Ambulatory Visit (INDEPENDENT_AMBULATORY_CARE_PROVIDER_SITE_OTHER): Payer: Medicare Other | Admitting: Family Medicine

## 2017-02-11 VITALS — BP 130/50 | HR 73 | Temp 98.2°F | Wt 140.6 lb

## 2017-02-11 DIAGNOSIS — M542 Cervicalgia: Secondary | ICD-10-CM

## 2017-02-11 DIAGNOSIS — E1142 Type 2 diabetes mellitus with diabetic polyneuropathy: Secondary | ICD-10-CM

## 2017-02-11 DIAGNOSIS — E782 Mixed hyperlipidemia: Secondary | ICD-10-CM | POA: Diagnosis not present

## 2017-02-11 DIAGNOSIS — E538 Deficiency of other specified B group vitamins: Secondary | ICD-10-CM | POA: Diagnosis not present

## 2017-02-11 DIAGNOSIS — R634 Abnormal weight loss: Secondary | ICD-10-CM

## 2017-02-11 DIAGNOSIS — M25511 Pain in right shoulder: Secondary | ICD-10-CM

## 2017-02-11 DIAGNOSIS — I10 Essential (primary) hypertension: Secondary | ICD-10-CM

## 2017-02-11 DIAGNOSIS — S199XXA Unspecified injury of neck, initial encounter: Secondary | ICD-10-CM | POA: Diagnosis not present

## 2017-02-11 MED ORDER — HYDROCODONE-ACETAMINOPHEN 5-325 MG PO TABS: 1.0000 | ORAL_TABLET | Freq: Four times a day (QID) | ORAL | 0 refills | Status: DC | PRN

## 2017-02-11 MED ORDER — CYANOCOBALAMIN 1000 MCG/ML IJ SOLN: 1000.0000 ug | Freq: Once | INTRAMUSCULAR | Status: DC

## 2017-02-11 NOTE — Progress Notes (Signed)
Pre visit review using our clinic review tool, if applicable. No additional management support is needed unless otherwise documented below in the visit note. 

## 2017-02-11 NOTE — Assessment & Plan Note (Signed)
Tolerating statin, encouraged heart healthy diet, avoid trans fats, minimize simple carbs and saturated fats. Increase exercise as tolerated 

## 2017-02-11 NOTE — Assessment & Plan Note (Signed)
Well controlled, no changes to meds. Encouraged heart healthy diet such as the DASH diet and exercise as tolerated.  °

## 2017-02-11 NOTE — Assessment & Plan Note (Signed)
Check leve with next blood draw, given shot today

## 2017-02-11 NOTE — Assessment & Plan Note (Signed)
hgba1c acceptable, minimize simple carbs. Increase exercise as tolerated. Continue current meds 

## 2017-02-11 NOTE — Progress Notes (Signed)
Patient ID: Dana Burns, female   DOB: [birth year], 83 y.o.   MRN: [id number]   Subjective:    Patient ID: Dana Burns, female    DOB: Oct 07, 1926, 83 y.o.   MRN: [id number]  No chief complaint on file.   Muscle Pain  The problem occurs intermittently. The problem is unchanged. The pain occurs in the context of an injury (Right shoulder.). The pain is present in the right shoulder. Pertinent negatives include no chest pain, fever, headaches, rash, shortness of breath or vomiting. The treatment provided mild relief.    Patient is in today for a 69-month follow up for shoulder pain. Patient states that she is still having right shoulder pain. Also states that she in now having pain in the collar bone area. A about 2-4 weeks. Patient suspects arthritis. No additional acute concerns noted. Patient has a Hx of hypertension, GERD, Type II Diabetes, osteoarthritis. She denies any falls or trauma. No weakness into arm. Is noting some posterior neck pain. Denies CP/palp/SOB/HA/congestion/fevers/GI or GU c/o. Taking meds as prescribed. Has been eating better and denies any recent hospitalizations.   I acted as a Education administrator for Penni Homans, MD. Raiford Noble, [place]   Past Medical History:  Diagnosis Date  . Allergy    sneeze, rhinorrhea in am  . Chicken pox as a child  . Chronic pain syndrome 01/23/2015  . Colon cancer ([place])   . Complication of anesthesia    agitated when waking up, wakes up shaking, "like  I'm going into shock"  . Depression    husband died  3 months ago, pt. admits depression  currently   . Diabetes mellitus    6 years  . DM (diabetes mellitus) type 2, uncontrolled, with ketoacidosis ([place]) 03/17/2011  . Fibromyalgia   . GERD (gastroesophageal reflux disease)   . H/O echocardiogram    done /w [place] in 2011, saw Dr. Percival Spanish for tachycardia   . Headache(784.0)    occasional - "bad headaches"  . Hyperlipidemia    6 years  . Hypertension    15 years, reports she has a rapid heartrate    . Measles as a child  . Osteoarthritis    in hands & all over, feet  . Pain of right shoulder region 07/07/2014  . Peripheral neuropathy ([place])   . Shortness of breath   . Type 2 diabetes mellitus with peripheral neuropathy ([place]) 03/17/2011    Past Surgical History:  Procedure Laterality Date  . BREAST SURGERY  2013   right- benign  . CATARACT EXTRACTION     /w IOL- bilateral   . COLON SURGERY     For resection of cancer  . Nodule removed     From throat    Family History  Problem Relation Age of Onset  . Cancer Mother     Brain  . Cancer Father     Colorectal  . Heart disease Neg Hx     Early  . Cancer Sister     breast  . Other Brother     pacemaker  . Cancer Brother     pancreatic cancer  . Arthritis Sister   . Emphysema Brother   . Cancer Brother   . COPD Brother   . Neuropathy Daughter   . Fibromyalgia Daughter     Social History   Social History  . Marital status: Widowed    Spouse name: N/A  . Number of children: 1  . Years of education: 23   Occupational  History  . Retired    Social History Main Topics  . Smoking status: Never Smoker  . Smokeless tobacco: Never Used  . Alcohol use No  . Drug use: No  . Sexual activity: No     Comment: lives with daughter. no dietary restrictions   Other Topics Concern  . Not on file   Social History Narrative   Patient lives at home with daughter.    Patient is retired.    Patient is widowed.    Patient has 1 child.    Patient has a 9th grade education.     Outpatient Medications Prior to Visit  Medication Sig Dispense Refill  . acetaminophen (TYLENOL) 500 MG tablet Take 1,000 mg by mouth 2 (two) times daily.    . calcium carbonate (OS-CAL) 600 MG TABS Take 600 mg by mouth 2 (two) times daily with a meal.      . Cholecalciferol (VITAMIN D3) 5000 UNITS CAPS Take 5,000 Units by mouth daily.    . citalopram (CELEXA) 10 MG tablet Take 1 tablet (10 mg total) by mouth daily. 90 tablet 1  . diazepam  (VALIUM) 5 MG tablet TAKE 1 AND 1/2 TABLET BY MOUTH TWICE DAILY AS NEEDED 45 tablet 2  . gabapentin (NEURONTIN) 800 MG tablet Take 1 tablet (800 mg total) by mouth 3 (three) times daily. 90 tablet 4  . mupirocin ointment (BACTROBAN) 2 % Place 1 application into the nose daily. 22 g 1  . pantoprazole (PROTONIX) 40 MG tablet TAKE 1 TABLET (40 MG TOTAL) BY MOUTH DAILY. 30 tablet 3  . pravastatin (PRAVACHOL) 40 MG tablet Take 1 tablet (40 mg total) by mouth at bedtime. 90 tablet 1  . triamcinolone cream (KENALOG) 0.1 % Apply 1 application topically 2 (two) times daily as needed. 45 g 1  . valsartan-hydrochlorothiazide (DIOVAN-HCT) 160-12.5 MG tablet TAKE 1 TABLET BY MOUTH DAILY. 30 tablet 6  . HYDROcodone-acetaminophen (NORCO/VICODIN) 5-325 MG tablet Take 1 tablet by mouth every 6 (six) hours as needed for moderate pain. 90 tablet 0  . metFORMIN (GLUCOPHAGE) 500 MG tablet Take 1 tablet (500 mg total) by mouth once. 90 tablet 3   No facility-administered medications prior to visit.     Allergies  Allergen Reactions  . Tape Itching and Rash  . Actos [Pioglitazone Hydrochloride] Other (See Comments)    Unknown  . Buspar [Buspirone Hcl] Other (See Comments)    Unknown  . Lipitor [Atorvastatin Calcium] Other (See Comments)    "It made me hurt all over."  . Penicillins Rash    Review of Systems  Constitutional: Negative for fever and malaise/fatigue.  HENT: Negative for congestion.   Eyes: Negative for blurred vision.  Respiratory: Negative for cough and shortness of breath.   Cardiovascular: Negative for chest pain, palpitations and leg swelling.  Gastrointestinal: Negative for vomiting.  Musculoskeletal: Positive for joint pain and neck pain. Negative for back pain.  Skin: Negative for rash.  Neurological: Negative for loss of consciousness and headaches.       Objective:    Physical Exam  Constitutional: She is oriented to person, place, and time. She appears well-developed and  well-nourished. No distress.  HENT:  Head: Normocephalic and atraumatic.  Eyes: Conjunctivae are normal.  Neck: Normal range of motion. No thyromegaly present.  Cardiovascular: Normal rate and regular rhythm.   Pulmonary/Chest: Effort normal and breath sounds normal. She has no wheezes.  Abdominal: Soft. Bowel sounds are normal. There is no tenderness.  Musculoskeletal: She exhibits  no edema or deformity.  Neurological: She is alert and oriented to person, place, and time.  Skin: Skin is warm and dry. She is not diaphoretic.  Psychiatric: She has a normal mood and affect.    BP (!) 130/50 (BP Location: Left Arm, Patient Position: Sitting, Cuff Size: Normal)   Pulse 73   Temp 98.2 F (36.8 C) (Oral)   Wt 140 lb 9.6 oz (63.8 kg)   SpO2 100% Comment: RA  BMI 22.69 kg/m  Wt Readings from Last 3 Encounters:  02/11/17 140 lb 9.6 oz (63.8 kg)  11/13/16 145 lb 8 oz (66 kg)  08/16/16 144 lb (65.3 kg)     Lab Results  Component Value Date   WBC 6.7 11/13/2016   HGB 12.4 11/13/2016   HCT 38.0 11/13/2016   PLT 240.0 11/13/2016   GLUCOSE 92 11/13/2016   CHOL 149 08/16/2016   TRIG 135.0 08/16/2016   HDL 43.20 08/16/2016   LDLCALC 79 08/16/2016   ALT 8 11/13/2016   AST 15 11/13/2016   NA 140 11/13/2016   K 5.2 11/13/2016   CL 102 11/13/2016   CREATININE 1.44 (H) 11/13/2016   BUN 24 11/13/2016   CO2 31 11/13/2016   TSH 2.23 08/16/2016   HGBA1C 6.5 08/16/2016   MICROALBUR <0.7 02/07/2016    Lab Results  Component Value Date   TSH 2.23 08/16/2016   Lab Results  Component Value Date   WBC 6.7 11/13/2016   HGB 12.4 11/13/2016   HCT 38.0 11/13/2016   MCV 84.5 11/13/2016   PLT 240.0 11/13/2016   Lab Results  Component Value Date   NA 140 11/13/2016   K 5.2 11/13/2016   CO2 31 11/13/2016   GLUCOSE 92 11/13/2016   BUN 24 11/13/2016   CREATININE 1.44 (H) 11/13/2016   BILITOT 0.4 11/13/2016   ALKPHOS 62 11/13/2016   AST 15 11/13/2016   ALT 8 11/13/2016   PROT 6.8  11/13/2016   ALBUMIN 4.2 11/13/2016   CALCIUM 9.6 11/13/2016   GFR 36.19 (L) 08/20/2016   Lab Results  Component Value Date   CHOL 149 08/16/2016   Lab Results  Component Value Date   HDL 43.20 08/16/2016   Lab Results  Component Value Date   LDLCALC 79 08/16/2016   Lab Results  Component Value Date   TRIG 135.0 08/16/2016   Lab Results  Component Value Date   CHOLHDL 3 08/16/2016   Lab Results  Component Value Date   HGBA1C 6.5 08/16/2016       Assessment & Plan:   Problem List Items Addressed This Visit    Essential hypertension, benign    Well controlled, no changes to meds. Encouraged heart healthy diet such as the DASH diet and exercise as tolerated.       Type 2 diabetes mellitus with peripheral neuropathy (HCC)    hgba1c acceptable, minimize simple carbs. Increase exercise as tolerated. Continue current meds      Relevant Orders   Hemoglobin A1c   Hyperlipidemia, mixed    Tolerating statin, encouraged heart healthy diet, avoid trans fats, minimize simple carbs and saturated fats. Increase exercise as tolerated      Relevant Orders   CBC   Comprehensive metabolic panel   TSH   Lipid panel   Loss of weight    Stable, encouraged adeqaute vegetable and protein intake, 64 oz of clear fluids daily      Vitamin B 12 deficiency    Check leve with next blood  draw, given shot today      Relevant Medications   cyanocobalamin ((VITAMIN B-12)) injection 1,000 mcg   Other Relevant Orders   Vitamin B12   Pain of right shoulder region    Posterior neck pain also noted. Xray of C spine unremarkable, encouraged topical treatments, Encouraged moist heat and gentle stretching as tolerated. May try NSAIDs and prescription meds as directed and report if symptoms worsen or seek immediate care. She will notify us if worsens.        Other Visit Diagnoses    Neck pain    -  Primary   Relevant Orders   DG Cervical Spine 2 or 3 views (Completed)      I have  discontinued Ms. Coad's HYDROcodone-acetaminophen. I am also having her start on HYDROcodone-acetaminophen. Additionally, I am having her maintain her calcium carbonate, Vitamin D3, acetaminophen, mupirocin ointment, triamcinolone cream, valsartan-hydrochlorothiazide, diazepam, metFORMIN, pravastatin, pantoprazole, gabapentin, and citalopram. We will continue to administer cyanocobalamin.  Meds ordered this encounter  Medications  . cyanocobalamin ((VITAMIN B-12)) injection 1,000 mcg  . HYDROcodone-acetaminophen (NORCO) 5-325 MG tablet    Sig: Take 1 tablet by mouth every 6 (six) hours as needed for moderate pain.    Dispense:  90 tablet    Refill:  0    CMA served as scribe during this visit. History, Physical and Plan performed by medical provider. Documentation and orders reviewed and attested to.  Penni Homans, MD

## 2017-02-11 NOTE — Patient Instructions (Addendum)
Apply Aspercereme, Solanpas, or Icy Hot with lidocaine to affected areas.carb ciybt Carbohydrate Counting for Diabetes Mellitus, Adult Carbohydrate counting is a method for keeping track of how many carbohydrates you eat. Eating carbohydrates naturally increases the amount of sugar (glucose) in the blood. Counting how many carbohydrates you eat helps keep your blood glucose within normal limits, which helps you manage your diabetes (diabetes mellitus). It is important to know how many carbohydrates you can safely have in each meal. This is different for every person. A diet and nutrition specialist (registered dietitian) can help you make a meal plan and calculate how many carbohydrates you should have at each meal and snack. Carbohydrates are found in the following foods:  Grains, such as breads and cereals.  Dried beans and soy products.  Starchy vegetables, such as potatoes, peas, and corn.  Fruit and fruit juices.  Milk and yogurt.  Sweets and snack foods, such as cake, cookies, candy, chips, and soft drinks. How do I count carbohydrates? There are two ways to count carbohydrates in food. You can use either of the methods or a combination of both. Reading "Nutrition Facts" on packaged food  The "Nutrition Facts" list is included on the labels of almost all packaged foods and beverages in the [place] It includes:  The serving size.  Information about nutrients in each serving, including the grams (g) of carbohydrate per serving. To use the "Nutrition Facts":  Decide how many servings you will have.  Multiply the number of servings by the number of carbohydrates per serving.  The resulting number is the total amount of carbohydrates that you will be having. Learning standard serving sizes of other foods  When you eat foods containing carbohydrates that are not packaged or do not include "Nutrition Facts" on the label, you need to measure the servings in order to count the amount of  carbohydrates:  Measure the foods that you will eat with a food scale or measuring cup, if needed.  Decide how many standard-size servings you will eat.  Multiply the number of servings by 15. Most carbohydrate-rich foods have about 15 g of carbohydrates per serving.  For example, if you eat 8 oz (170 g) of strawberries, you will have eaten 2 servings and 30 g of carbohydrates (2 servings x 15 g = 30 g).  For foods that have more than one food mixed, such as soups and casseroles, you must count the carbohydrates in each food that is included. The following list contains standard serving sizes of common carbohydrate-rich foods. Each of these servings has about 15 g of carbohydrates:   hamburger bun or  English muffin.   oz (15 mL) syrup.   oz (14 g) jelly.  1 slice of bread.  1 six-inch tortilla.  3 oz (85 g) cooked rice or pasta.  4 oz (113 g) cooked dried beans.  4 oz (113 g) starchy vegetable, such as peas, corn, or potatoes.  4 oz (113 g) hot cereal.  4 oz (113 g) mashed potatoes or  of a large baked potato.  4 oz (113 g) canned or frozen fruit.  4 oz (120 mL) fruit juice.  4-6 crackers.  6 chicken nuggets.  6 oz (170 g) unsweetened dry cereal.  6 oz (170 g) plain fat-free yogurt or yogurt sweetened with artificial sweeteners.  8 oz (240 mL) milk.  8 oz (170 g) fresh fruit or one small piece of fruit.  24 oz (680 g) popped popcorn. Example of carbohydrate counting  Sample meal   3 oz (85 g) chicken breast.  6 oz (170 g) brown rice.  4 oz (113 g) corn.  8 oz (240 mL) milk.  8 oz (170 g) strawberries with sugar-free whipped topping. Carbohydrate calculation  1. Identify the foods that contain carbohydrates:  Rice.  Corn.  Milk.  Strawberries. 2. Calculate how many servings you have of each food:  2 servings rice.  1 serving corn.  1 serving milk.  1 serving strawberries. 3. Multiply each number of servings by 15 g:  2 servings  rice x 15 g = 30 g.  1 serving corn x 15 g = 15 g.  1 serving milk x 15 g = 15 g.  1 serving strawberries x 15 g = 15 g. 4. Add together all of the amounts to find the total grams of carbohydrates eaten:  30 g + 15 g + 15 g + 15 g = 75 g of carbohydrates total. This information is not intended to replace advice given to you by your health care provider. Make sure you discuss any questions you have with your health care provider. Document Released: 12/17/2005 Document Revised: 07/06/2016 Document Reviewed: 05/30/2016 Elsevier Interactive Patient Education  2017 [employer]  

## 2017-02-12 ENCOUNTER — Encounter: Payer: Self-pay | Admitting: Family Medicine

## 2017-02-12 DIAGNOSIS — Z79891 Long term (current) use of opiate analgesic: Secondary | ICD-10-CM | POA: Diagnosis not present

## 2017-02-12 DIAGNOSIS — Z79899 Other long term (current) drug therapy: Secondary | ICD-10-CM | POA: Diagnosis not present

## 2017-02-18 NOTE — Assessment & Plan Note (Signed)
Posterior neck pain also noted. Xray of C spine unremarkable, encouraged topical treatments, Encouraged moist heat and gentle stretching as tolerated. May try NSAIDs and prescription meds as directed and report if symptoms worsen or seek immediate care. She will notify us if worsens.

## 2017-02-18 NOTE — Assessment & Plan Note (Signed)
Stable, encouraged adeqaute vegetable and protein intake, 64 oz of clear fluids daily

## 2017-02-19 ENCOUNTER — Ambulatory Visit: Payer: Self-pay

## 2017-03-28 ENCOUNTER — Telehealth: Payer: Self-pay | Admitting: Family Medicine

## 2017-03-28 NOTE — Telephone Encounter (Signed)
Called patient to schedule awv. Lvm for patient to call office to schedule appt.  °

## 2017-04-03 DIAGNOSIS — E119 Type 2 diabetes mellitus without complications: Secondary | ICD-10-CM | POA: Diagnosis not present

## 2017-04-03 LAB — HM DIABETES EYE EXAM

## 2017-04-08 DIAGNOSIS — E119 Type 2 diabetes mellitus without complications: Secondary | ICD-10-CM | POA: Diagnosis not present

## 2017-04-11 ENCOUNTER — Ambulatory Visit (INDEPENDENT_AMBULATORY_CARE_PROVIDER_SITE_OTHER): Payer: Medicare Other | Admitting: Behavioral Health

## 2017-04-11 DIAGNOSIS — E538 Deficiency of other specified B group vitamins: Secondary | ICD-10-CM

## 2017-04-11 MED ORDER — CYANOCOBALAMIN 1000 MCG/ML IJ SOLN
1000.0000 ug | Freq: Once | INTRAMUSCULAR | Status: AC
  Administered 2017-04-11: 1000 ug via INTRAMUSCULAR

## 2017-04-11 NOTE — Progress Notes (Signed)
Pre visit review using our clinic review tool, if applicable. No additional management support is needed unless otherwise documented below in the visit note.  Patient in clinic for B12 injection. IM injection given in left deltoid. Patient tolerated it well. She will call the office to schedule the next appointment.

## 2017-04-16 ENCOUNTER — Encounter: Payer: Self-pay | Admitting: Family Medicine

## 2017-05-07 ENCOUNTER — Other Ambulatory Visit (INDEPENDENT_AMBULATORY_CARE_PROVIDER_SITE_OTHER): Payer: Medicare Other

## 2017-05-07 DIAGNOSIS — E1142 Type 2 diabetes mellitus with diabetic polyneuropathy: Secondary | ICD-10-CM | POA: Diagnosis not present

## 2017-05-07 DIAGNOSIS — E538 Deficiency of other specified B group vitamins: Secondary | ICD-10-CM

## 2017-05-07 DIAGNOSIS — E782 Mixed hyperlipidemia: Secondary | ICD-10-CM | POA: Diagnosis not present

## 2017-05-07 LAB — COMPREHENSIVE METABOLIC PANEL
ALT: 8 U/L (ref 0–35)
AST: 16 U/L (ref 0–37)
Albumin: 4.2 g/dL (ref 3.5–5.2)
Alkaline Phosphatase: 64 U/L (ref 39–117)
BUN: 36 mg/dL — AB (ref 6–23)
CHLORIDE: 102 meq/L (ref 96–112)
CO2: 31 meq/L (ref 19–32)
CREATININE: 1.89 mg/dL — AB (ref 0.40–1.20)
Calcium: 9.7 mg/dL (ref 8.4–10.5)
GFR: 26.61 mL/min — ABNORMAL LOW (ref >60.00)
Glucose, Bld: 104 mg/dL — ABNORMAL HIGH (ref 70–99)
Potassium: 5.2 mEq/L — ABNORMAL HIGH (ref 3.5–5.1)
SODIUM: 137 meq/L (ref 135–145)
Total Bilirubin: 0.3 mg/dL (ref 0.2–1.2)
Total Protein: 7 g/dL (ref 6.0–8.3)

## 2017-05-07 LAB — CBC
HEMATOCRIT: 38.5 % (ref 36.0–46.0)
Hemoglobin: 12.5 g/dL (ref 12.0–15.0)
MCHC: 32.6 g/dL (ref 30.0–36.0)
MCV: 84.6 fl (ref 78.0–100.0)
Platelets: 231 10*3/uL (ref 150.0–400.0)
RBC: 4.55 Mil/uL (ref 3.87–5.11)
RDW: 15.7 % — ABNORMAL HIGH (ref 11.5–15.5)
WBC: 5.2 10*3/uL (ref 4.0–10.5)

## 2017-05-07 LAB — LIPID PANEL
CHOL/HDL RATIO: 4
Cholesterol: 158 mg/dL (ref 0–200)
HDL: 41.1 mg/dL (ref >39.00)
LDL CALC: 86 mg/dL (ref 0–99)
NONHDL: 116.87
Triglycerides: 153 mg/dL — ABNORMAL HIGH (ref 0.0–149.0)
VLDL: 30.6 mg/dL (ref 0.0–40.0)

## 2017-05-07 LAB — HEMOGLOBIN A1C: HEMOGLOBIN A1C: 6.8 % — AB (ref 4.6–6.5)

## 2017-05-07 LAB — VITAMIN B12: Vitamin B-12: 495 pg/mL (ref 211–911)

## 2017-05-07 LAB — TSH: TSH: 2 u[IU]/mL (ref 0.35–4.50)

## 2017-05-13 NOTE — Progress Notes (Signed)
Subjective:   Dana Burns is a 83 y.o. female who presents for Medicare Annual (Subsequent) preventive examination.  Review of Systems:  No ROS.  Medicare Wellness Visit. Cardiac Risk Factors include: advanced age (>33men, >56 women);diabetes mellitus;dyslipidemia;hypertension;sedentary lifestyle Sleep patterns: Feels rested on occasion. It varies per pt. Home Safety/Smoke Alarms: Pt has tub stool. Feels safe in home. Smoke alarms in place.  Living environment; residence and Firearm Safety: Daughter lives with pt. No stairs Seat Belt Safety/Bike Helmet: Wears seat belt.   Counseling:   Eye Exam- Wears glasses. Dr. Sabra Heck annually. Dental-Dr.Madison every 6 months.  Female:   Pap-  No longer due to age     83- Last 07/05/16: BI-RADS CATEGORY  2: Benign.       Dexa scan-   Last 01/19/14:  Pt states she will schedule soon. CCS- No longer due to age     Objective:     Vitals: BP (!) 120/56 (BP Location: Left Arm, Patient Position: Sitting, Cuff Size: Normal)   Pulse 63   Ht 5\' 6"  (1.676 m)   Wt 143 lb 9.6 oz (65.1 kg)   SpO2 98%   BMI 23.18 kg/m   Body mass index is 23.18 kg/m.   Tobacco History  Smoking Status  . Never Smoker  Smokeless Tobacco  . Never Used     Counseling given: No   Past Medical History:  Diagnosis Date  . Allergy    sneeze, rhinorrhea in am  . Chicken pox as a child  . Chronic pain syndrome 01/23/2015  . Colon cancer ([place])   . Complication of anesthesia    agitated when waking up, wakes up shaking, "like  I'm going into shock"  . Depression    husband died  3 months ago, pt. admits depression  currently   . Diabetes mellitus    6 years  . DM (diabetes mellitus) type 2, uncontrolled, with ketoacidosis ([place]) 03/17/2011  . Fibromyalgia   . GERD (gastroesophageal reflux disease)   . H/O echocardiogram    done /w [place] in 2011, saw Dr. Percival Spanish for tachycardia   . Headache(784.0)    occasional - "bad headaches"  . Hyperlipidemia    6  years  . Hypertension    17 years, reports she has a rapid heartrate   . Measles as a child  . Osteoarthritis    in hands & all over, feet  . Pain of right shoulder region 07/07/2014  . Peripheral neuropathy   . Shortness of breath   . Type 2 diabetes mellitus with peripheral neuropathy ([place]) 03/17/2011   Past Surgical History:  Procedure Laterality Date  . BREAST SURGERY  2013   right- benign  . CATARACT EXTRACTION     /w IOL- bilateral   . COLON SURGERY     For resection of cancer  . Nodule removed     From throat   Family History  Problem Relation Age of Onset  . Cancer Mother        Brain  . Cancer Father        Colorectal  . Cancer Sister        breast  . Other Brother        pacemaker  . Cancer Brother        pancreatic cancer  . Arthritis Sister   . Emphysema Brother   . Cancer Brother   . COPD Brother   . Neuropathy Daughter   . Fibromyalgia Daughter   .  Heart disease Neg Hx        Early   History  Sexual Activity  . Sexual activity: No    Comment: lives with daughter. no dietary restrictions    Outpatient Encounter Prescriptions as of 05/14/2017  Medication Sig  . calcium carbonate (OS-CAL) 600 MG TABS Take 600 mg by mouth 2 (two) times daily with a meal.    . Cholecalciferol (VITAMIN D3) 5000 UNITS CAPS Take 5,000 Units by mouth daily.  . citalopram (CELEXA) 10 MG tablet Take 1 tablet (10 mg total) by mouth daily.  . diazepam (VALIUM) 5 MG tablet TAKE 1 AND 1/2 TABLET BY MOUTH TWICE DAILY AS NEEDED  . gabapentin (NEURONTIN) 800 MG tablet Take 1 tablet (800 mg total) by mouth 3 (three) times daily.  Marland Kitchen HYDROcodone-acetaminophen (NORCO) 5-325 MG tablet Take 1 tablet by mouth every 6 (six) hours as needed for moderate pain.  . pantoprazole (PROTONIX) 40 MG tablet TAKE 1 TABLET (40 MG TOTAL) BY MOUTH DAILY.  . pravastatin (PRAVACHOL) 40 MG tablet Take 1 tablet (40 mg total) by mouth at bedtime.  . valsartan-hydrochlorothiazide (DIOVAN-HCT) 160-12.5 MG  tablet TAKE 1 TABLET BY MOUTH DAILY.  Marland Kitchen acetaminophen (TYLENOL) 500 MG tablet Take 1,000 mg by mouth 2 (two) times daily.  . metFORMIN (GLUCOPHAGE) 500 MG tablet Take 1 tablet (500 mg total) by mouth once.  . mupirocin ointment (BACTROBAN) 2 % Place 1 application into the nose daily. (Patient not taking: Reported on 05/14/2017)  . triamcinolone cream (KENALOG) 0.1 % Apply 1 application topically 2 (two) times daily as needed. (Patient not taking: Reported on 05/14/2017)   Facility-Administered Encounter Medications as of 05/14/2017  Medication  . cyanocobalamin ((VITAMIN B-12)) injection 1,000 mcg    Activities of Daily Living In your present state of health, do you have any difficulty performing the following activities: 05/14/2017 02/11/2017  Hearing? Tempie Donning  Vision? N Y  Difficulty concentrating or making decisions? N N  Walking or climbing stairs? Y N  Dressing or bathing? N N  Doing errands, shopping? N N  Preparing Food and eating ? N -  Using the Toilet? N -  In the past six months, have you accidently leaked urine? N -  Do you have problems with loss of bowel control? N -  Managing your Medications? N -  Managing your Finances? N -  Housekeeping or managing your Housekeeping? N -  Some recent data might be hidden    Patient Care Team: Mosie Lukes, MD as PCP - General (Family Medicine) Marica Otter, [place] as Consulting Physician (Optometry) Garlan Fair, MD as Consulting Physician (Gastroenterology)    Assessment:    Physical assessment deferred to PCP.  Exercise Activities and Dietary recommendations Current Exercise Habits: The patient does not participate in regular exercise at present, Exercise limited by: orthopedic condition(s) Diet (meal preparation, eat out, water intake, caffeinated beverages, dairy products, fruits and vegetables): in general, a "healthy" diet        Goals      Patient Stated   . Cut back on eating desserts to 3-4 times per week.    (pt-stated)      Other   . Increase physical activity          Walking and chair exercises.        Fall Risk Fall Risk  05/14/2017 02/11/2017 09/22/2015 04/19/2015 03/30/2014  Falls in the past year? No Yes No Yes No  Number falls in past yr: - 1 -  1 -  Injury with Fall? - Yes - No -  Risk for fall due to : - - History of fall(s);Impaired mobility;Impaired vision;Impaired balance/gait - -  Risk for fall due to (comments): - - Walks with cane//has fear of falling - -   Depression Screen PHQ 2/9 Scores 05/14/2017 02/11/2017 09/22/2015 04/19/2015  PHQ - 2 Score 0 0 1 0     Cognitive Function MMSE - Mini Mental State Exam 05/14/2017 09/22/2015  Orientation to time 5 5  Orientation to Place 5 5  Registration 3 3  Attention/ Calculation 5 5  Recall 2 3  Language- name 2 objects 2 2  Language- repeat 1 1  Language- follow 3 step command 3 3  Language- read & follow direction 1 1  Write a sentence 1 1  Copy design 1 1  Total score 29 30        Immunization History  Administered Date(s) Administered  . Influenza Whole 09/30/2010  . Influenza,inj,Quad PF,36+ Mos 08/24/2014, 11/13/2016  . Influenza-Unspecified 01/04/2014, 10/13/2015  . Pneumococcal Conjugate-13 09/14/2014  . Pneumococcal Polysaccharide-23 10/27/2015  . Td 01/01/2004  . Tdap 10/17/2011   Screening Tests Health Maintenance  Topic Date Due  . INFLUENZA VACCINE  07/31/2017  . HEMOGLOBIN A1C  11/07/2017  . FOOT EXAM  02/11/2018  . OPHTHALMOLOGY EXAM  04/03/2018  . TETANUS/TDAP  10/16/2021  . DEXA SCAN  Completed  . PNA vac Low Risk Adult  Completed      Plan:  Follow up with PCP today as scheduled  Continue to eat heart healthy diet (full of fruits, vegetables, whole grains, lean protein, water--limit salt, fat, and sugar intake) and increase physical activity as tolerated.  Continue doing brain stimulating activities (puzzles, reading, adult coloring books, staying active) to keep memory sharp.    I have  personally reviewed and noted the following in the patient's chart:   . Medical and social history . Use of alcohol, tobacco or illicit drugs  . Current medications and supplements . Functional ability and status . Nutritional status . Physical activity . Advanced directives . List of other physicians . Hospitalizations, surgeries, and ER visits in previous 12 months . Vitals . Screenings to include cognitive, depression, and falls . Referrals and appointments  In addition, I have reviewed and discussed with patient certain preventive protocols, quality metrics, and best practice recommendations. A written personalized care plan for preventive services as well as general preventive health recommendations were provided to patient.     Shela Nevin, [place]  05/14/2017

## 2017-05-14 ENCOUNTER — Ambulatory Visit (INDEPENDENT_AMBULATORY_CARE_PROVIDER_SITE_OTHER): Payer: Medicare Other | Admitting: Family Medicine

## 2017-05-14 ENCOUNTER — Encounter: Payer: Self-pay | Admitting: Family Medicine

## 2017-05-14 VITALS — BP 120/56 | HR 63 | Ht 66.0 in | Wt 143.6 lb

## 2017-05-14 DIAGNOSIS — E538 Deficiency of other specified B group vitamins: Secondary | ICD-10-CM | POA: Diagnosis not present

## 2017-05-14 DIAGNOSIS — F419 Anxiety disorder, unspecified: Secondary | ICD-10-CM

## 2017-05-14 DIAGNOSIS — R000 Tachycardia, unspecified: Secondary | ICD-10-CM | POA: Diagnosis not present

## 2017-05-14 DIAGNOSIS — N189 Chronic kidney disease, unspecified: Secondary | ICD-10-CM

## 2017-05-14 DIAGNOSIS — M8580 Other specified disorders of bone density and structure, unspecified site: Secondary | ICD-10-CM | POA: Diagnosis not present

## 2017-05-14 DIAGNOSIS — E782 Mixed hyperlipidemia: Secondary | ICD-10-CM

## 2017-05-14 DIAGNOSIS — I10 Essential (primary) hypertension: Secondary | ICD-10-CM | POA: Diagnosis not present

## 2017-05-14 DIAGNOSIS — E1142 Type 2 diabetes mellitus with diabetic polyneuropathy: Secondary | ICD-10-CM

## 2017-05-14 DIAGNOSIS — M79644 Pain in right finger(s): Secondary | ICD-10-CM | POA: Diagnosis not present

## 2017-05-14 DIAGNOSIS — Z0000 Encounter for general adult medical examination without abnormal findings: Secondary | ICD-10-CM | POA: Diagnosis not present

## 2017-05-14 MED ORDER — VALSARTAN-HYDROCHLOROTHIAZIDE 80-12.5 MG PO TABS: 1.0000 | ORAL_TABLET | Freq: Every day | ORAL | 2 refills | Status: DC

## 2017-05-14 MED ORDER — DIAZEPAM 5 MG PO TABS: ORAL_TABLET | ORAL | 2 refills | Status: DC

## 2017-05-14 MED ORDER — RANITIDINE HCL 150 MG PO TABS: 150.0000 mg | ORAL_TABLET | Freq: Every evening | ORAL | 2 refills | Status: DC | PRN

## 2017-05-14 MED ORDER — CYANOCOBALAMIN 1000 MCG/ML IJ SOLN
1000.0000 ug | Freq: Once | INTRAMUSCULAR | Status: AC
  Administered 2017-05-14: 1000 ug via INTRAMUSCULAR

## 2017-05-14 NOTE — Assessment & Plan Note (Signed)
Tolerating statin, encouraged heart healthy diet, avoid trans fats, minimize simple carbs and saturated fats. Increase exercise as tolerated 

## 2017-05-14 NOTE — Patient Instructions (Addendum)
Try LIdocaine gel by aspercreme, icy hot or salon pas to the finger once to twice daily Dana Burns , Thank you for taking time to come for your Medicare Wellness Visit. I appreciate your ongoing commitment to your health goals. Please review the following plan we discussed and let me know if I can assist you in the future.   These are the goals we discussed: Goals      Patient Stated   . Cut back on eating desserts to 3-4 times per week.   (pt-stated)      Other   . Increase physical activity          Walking and chair exercises.         This is a list of the screening recommended for you and due dates:  Health Maintenance  Topic Date Due  . Flu Shot  07/31/2017  . Hemoglobin A1C  11/07/2017  . Complete foot exam   02/11/2018  . Eye exam for diabetics  04/03/2018  . Tetanus Vaccine  10/16/2021  . DEXA scan (bone density measurement)  Completed  . Pneumonia vaccines  Completed    Continue to eat heart healthy diet (full of fruits, vegetables, whole grains, lean protein, water--limit salt, fat, and sugar intake) and increase physical activity as tolerated.  Continue doing brain stimulating activities (puzzles, reading, adult coloring books, staying active) to keep memory sharp.   Health Maintenance for Postmenopausal Women Menopause is a normal process in which your reproductive ability comes to an end. This process happens gradually over a span of months to years, usually between the ages of 38 and 83. Menopause is complete when you have missed 12 consecutive menstrual periods. It is important to talk with your health care provider about some of the most common conditions that affect postmenopausal women, such as heart disease, cancer, and bone loss (osteoporosis). Adopting a healthy lifestyle and getting preventive care can help to promote your health and wellness. Those actions can also lower your chances of developing some of these common conditions. What should I know about  menopause? During menopause, you may experience a number of symptoms, such as:  Moderate-to-severe hot flashes.  Night sweats.  Decrease in sex drive.  Mood swings.  Headaches.  Tiredness.  Irritability.  Memory problems.  Insomnia. Choosing to treat or not to treat menopausal changes is an individual decision that you make with your health care provider. What should I know about hormone replacement therapy and supplements? Hormone therapy products are effective for treating symptoms that are associated with menopause, such as hot flashes and night sweats. Hormone replacement carries certain risks, especially as you become older. If you are thinking about using estrogen or estrogen with progestin treatments, discuss the benefits and risks with your health care provider. What should I know about heart disease and stroke? Heart disease, heart attack, and stroke become more likely as you age. This may be due, in part, to the hormonal changes that your body experiences during menopause. These can affect how your body processes dietary fats, triglycerides, and cholesterol. Heart attack and stroke are both medical emergencies. There are many things that you can do to help prevent heart disease and stroke:  Have your blood pressure checked at least every 1-2 years. High blood pressure causes heart disease and increases the risk of stroke.  If you are 64-87 years old, ask your health care provider if you should take aspirin to prevent a heart attack or a stroke.  Do  not use any tobacco products, including cigarettes, chewing tobacco, or electronic cigarettes. If you need help quitting, ask your health care provider.  It is important to eat a healthy diet and maintain a healthy weight.  Be sure to include plenty of vegetables, fruits, low-fat dairy products, and lean protein.  Avoid eating foods that are high in solid fats, added sugars, or salt (sodium).  Get regular exercise. This is  one of the most important things that you can do for your health.  Try to exercise for at least 150 minutes each week. The type of exercise that you do should increase your heart rate and make you sweat. This is known as moderate-intensity exercise.  Try to do strengthening exercises at least twice each week. Do these in addition to the moderate-intensity exercise.  Know your numbers.Ask your health care provider to check your cholesterol and your blood glucose. Continue to have your blood tested as directed by your health care provider. What should I know about cancer screening? There are several types of cancer. Take the following steps to reduce your risk and to catch any cancer development as early as possible. Breast Cancer  Practice breast self-awareness.  This means understanding how your breasts normally appear and feel.  It also means doing regular breast self-exams. Let your health care provider know about any changes, no matter how small.  If you are 49 or older, have a clinician do a breast exam (clinical breast exam or CBE) every year. Depending on your age, family history, and medical history, it may be recommended that you also have a yearly breast X-ray (mammogram).  If you have a family history of breast cancer, talk with your health care provider about genetic screening.  If you are at high risk for breast cancer, talk with your health care provider about having an MRI and a mammogram every year.  Breast cancer (BRCA) gene test is recommended for women who have family members with BRCA-related cancers. Results of the assessment will determine the need for genetic counseling and BRCA1 and for BRCA2 testing. BRCA-related cancers include these types:  Breast. This occurs in males or females.  Ovarian.  Tubal. This may also be called fallopian tube cancer.  Cancer of the abdominal or pelvic lining (peritoneal cancer).  Prostate.  Pancreatic. Cervical, Uterine, and  Ovarian Cancer  Your health care provider may recommend that you be screened regularly for cancer of the pelvic organs. These include your ovaries, uterus, and vagina. This screening involves a pelvic exam, which includes checking for microscopic changes to the surface of your cervix (Pap test).  For women ages 21-65, health care providers may recommend a pelvic exam and a Pap test every three years. For women ages 58-65, they may recommend the Pap test and pelvic exam, combined with testing for human papilloma virus (HPV), every five years. Some types of HPV increase your risk of cervical cancer. Testing for HPV may also be done on women of any age who have unclear Pap test results.  Other health care providers may not recommend any screening for nonpregnant women who are considered low risk for pelvic cancer and have no symptoms. Ask your health care provider if a screening pelvic exam is right for you.  If you have had past treatment for cervical cancer or a condition that could lead to cancer, you need Pap tests and screening for cancer for at least 20 years after your treatment. If Pap tests have been discontinued for you,  your risk factors (such as having a new sexual partner) need to be reassessed to determine if you should start having screenings again. Some women have medical problems that increase the chance of getting cervical cancer. In these cases, your health care provider may recommend that you have screening and Pap tests more often.  If you have a family history of uterine cancer or ovarian cancer, talk with your health care provider about genetic screening.  If you have vaginal bleeding after reaching menopause, tell your health care provider.  There are currently no reliable tests available to screen for ovarian cancer. Lung Cancer  Lung cancer screening is recommended for adults 61-14 years old who are at high risk for lung cancer because of a history of smoking. A yearly low-dose  CT scan of the lungs is recommended if you:  Currently smoke.  Have a history of at least 30 pack-years of smoking and you currently smoke or have quit within the past 15 years. A pack-year is smoking an average of one pack of cigarettes per day for one year. Yearly screening should:  Continue until it has been 15 years since you quit.  Stop if you develop a health problem that would prevent you from having lung cancer treatment. Colorectal Cancer  This type of cancer can be detected and can often be prevented.  Routine colorectal cancer screening usually begins at age 27 and continues through age 109.  If you have risk factors for colon cancer, your health care provider may recommend that you be screened at an earlier age.  If you have a family history of colorectal cancer, talk with your health care provider about genetic screening.  Your health care provider may also recommend using home test kits to check for hidden blood in your stool.  A small camera at the end of a tube can be used to examine your colon directly (sigmoidoscopy or colonoscopy). This is done to check for the earliest forms of colorectal cancer.  Direct examination of the colon should be repeated every 5-10 years until age 31. However, if early forms of precancerous polyps or small growths are found or if you have a family history or genetic risk for colorectal cancer, you may need to be screened more often. Skin Cancer  Check your skin from head to toe regularly.  Monitor any moles. Be sure to tell your health care provider:  About any new moles or changes in moles, especially if there is a change in a mole's shape or color.  If you have a mole that is larger than the size of a pencil eraser.  If any of your family members has a history of skin cancer, especially at a young age, talk with your health care provider about genetic screening.  Always use sunscreen. Apply sunscreen liberally and repeatedly  throughout the day.  Whenever you are outside, protect yourself by wearing long sleeves, pants, a wide-brimmed hat, and sunglasses. What should I know about osteoporosis? Osteoporosis is a condition in which bone destruction happens more quickly than new bone creation. After menopause, you may be at an increased risk for osteoporosis. To help prevent osteoporosis or the bone fractures that can happen because of osteoporosis, the following is recommended:  If you are 81-59 years old, get at least 1,000 mg of calcium and at least 600 mg of vitamin D per day.  If you are older than age 57 but younger than age 35, get at least 1,200 mg of calcium  and at least 600 mg of vitamin D per day.  If you are older than age 85, get at least 1,200 mg of calcium and at least 800 mg of vitamin D per day. Smoking and excessive alcohol intake increase the risk of osteoporosis. Eat foods that are rich in calcium and vitamin D, and do weight-bearing exercises several times each week as directed by your health care provider. What should I know about how menopause affects my mental health? Depression may occur at any age, but it is more common as you become older. Common symptoms of depression include:  Low or sad mood.  Changes in sleep patterns.  Changes in appetite or eating patterns.  Feeling an overall lack of motivation or enjoyment of activities that you previously enjoyed.  Frequent crying spells. Talk with your health care provider if you think that you are experiencing depression. What should I know about immunizations? It is important that you get and maintain your immunizations. These include:  Tetanus, diphtheria, and pertussis (Tdap) booster vaccine.  Influenza every year before the flu season begins.  Pneumonia vaccine.  Shingles vaccine. Your health care provider may also recommend other immunizations. This information is not intended to replace advice given to you by your health care  provider. Make sure you discuss any questions you have with your health care provider. Document Released: 02/08/2006 Document Revised: 07/06/2016 Document Reviewed: 09/20/2015 Elsevier Interactive Patient Education  2017 [employer].

## 2017-05-14 NOTE — Assessment & Plan Note (Signed)
hgba1c acceptable, minimize simple carbs. Increase exercise as tolerated. Continue current meds 

## 2017-05-14 NOTE — Progress Notes (Signed)
Patient ID: Dana Burns, female   DOB: 1926-07-21, 83 y.o.   MRN: [id number]   Subjective:  I acted as a Education administrator for Penni Homans, [place], [place]   Patient ID: Dana Burns, female    DOB: 1926-09-28, 83 y.o.   MRN: [id number]  Chief Complaint  Patient presents with  . Medicare Wellness    with RN  . Follow-up    Neck pain.     HPI  Patient is in today for was seen today for an Annual Wellness Visit with the Health Coach, then to be sen by the Provider for a 27-month follow up. Patient has a Hx of HTN, GERD, Type II Diabetes, osteopenia, CKD, tachycardia, fibromyalgia, anxiety, Vitamin B deficiency. Patient has no acute concerns noted at this time. She is complaining of pain in right index finger with a nodule that has developed over past couple of months. No trauma. No recent febrile illness or hospitalization. Denies CP/palp/SOB/HA/congestion/fevers/GI or GU c/o. Taking meds as prescribed  Patient Care Team: Mosie Lukes, MD as PCP - General (Family Medicine) Marica Otter, [place] as Consulting Physician (Optometry) Garlan Fair, MD as Consulting Physician (Gastroenterology)   Past Medical History:  Diagnosis Date  . Allergy    sneeze, rhinorrhea in am  . Chicken pox as a child  . Chronic pain syndrome 01/23/2015  . Colon cancer ([place])   . Complication of anesthesia    agitated when waking up, wakes up shaking, "like  I'm going into shock"  . Depression    husband died  3 months ago, pt. admits depression  currently   . Diabetes mellitus    6 years  . DM (diabetes mellitus) type 2, uncontrolled, with ketoacidosis ([place]) 03/17/2011  . Fibromyalgia   . GERD (gastroesophageal reflux disease)   . H/O echocardiogram    done /w [place] in 2011, saw Dr. Percival Spanish for tachycardia   . Headache(784.0)    occasional - "bad headaches"  . Hyperlipidemia    6 years  . Hypertension    15 years, reports she has a rapid heartrate   . Measles as a child  . Osteoarthritis    in hands  & all over, feet  . Pain in finger of right hand 05/15/2017  . Pain of right shoulder region 07/07/2014  . Peripheral neuropathy   . Shortness of breath   . Type 2 diabetes mellitus with peripheral neuropathy ([place]) 03/17/2011    Past Surgical History:  Procedure Laterality Date  . BREAST SURGERY  2013   right- benign  . CATARACT EXTRACTION     /w IOL- bilateral   . COLON SURGERY     For resection of cancer  . Nodule removed     From throat    Family History  Problem Relation Age of Onset  . Cancer Mother        Brain  . Cancer Father        Colorectal  . Cancer Sister        breast  . Other Brother        pacemaker  . Cancer Brother        pancreatic cancer  . Arthritis Sister   . Emphysema Brother   . Cancer Brother   . COPD Brother   . Neuropathy Daughter   . Fibromyalgia Daughter   . Heart disease Neg Hx        Early    Social History   Social History  .  Marital status: Widowed    Spouse name: N/A  . Number of children: 1  . Years of education: 9   Occupational History  . Retired    Social History Main Topics  . Smoking status: Never Smoker  . Smokeless tobacco: Never Used  . Alcohol use No  . Drug use: No  . Sexual activity: No     Comment: lives with daughter. no dietary restrictions   Other Topics Concern  . Not on file   Social History Narrative   Patient lives at home with daughter.    Patient is retired.    Patient is widowed.    Patient has 1 child.    Patient has a 9th grade education.     Outpatient Medications Prior to Visit  Medication Sig Dispense Refill  . calcium carbonate (OS-CAL) 600 MG TABS Take 600 mg by mouth 2 (two) times daily with a meal.      . Cholecalciferol (VITAMIN D3) 5000 UNITS CAPS Take 5,000 Units by mouth daily.    . citalopram (CELEXA) 10 MG tablet Take 1 tablet (10 mg total) by mouth daily. 90 tablet 1  . gabapentin (NEURONTIN) 800 MG tablet Take 1 tablet (800 mg total) by mouth 3 (three) times daily. 90  tablet 4  . HYDROcodone-acetaminophen (NORCO) 5-325 MG tablet Take 1 tablet by mouth every 6 (six) hours as needed for moderate pain. 90 tablet 0  . pravastatin (PRAVACHOL) 40 MG tablet Take 1 tablet (40 mg total) by mouth at bedtime. 90 tablet 1  . diazepam (VALIUM) 5 MG tablet TAKE 1 AND 1/2 TABLET BY MOUTH TWICE DAILY AS NEEDED 45 tablet 2  . pantoprazole (PROTONIX) 40 MG tablet TAKE 1 TABLET (40 MG TOTAL) BY MOUTH DAILY. 30 tablet 3  . valsartan-hydrochlorothiazide (DIOVAN-HCT) 160-12.5 MG tablet TAKE 1 TABLET BY MOUTH DAILY. 30 tablet 6  . acetaminophen (TYLENOL) 500 MG tablet Take 1,000 mg by mouth 2 (two) times daily.    . metFORMIN (GLUCOPHAGE) 500 MG tablet Take 1 tablet (500 mg total) by mouth once. 90 tablet 3  . mupirocin ointment (BACTROBAN) 2 % Place 1 application into the nose daily. (Patient not taking: Reported on 05/14/2017) 22 g 1  . triamcinolone cream (KENALOG) 0.1 % Apply 1 application topically 2 (two) times daily as needed. (Patient not taking: Reported on 05/14/2017) 45 g 1   Facility-Administered Medications Prior to Visit  Medication Dose Route Frequency Provider Last Rate Last Dose  . cyanocobalamin ((VITAMIN B-12)) injection 1,000 mcg  1,000 mcg Intramuscular Once Mosie Lukes, MD        Allergies  Allergen Reactions  . Tape Itching and Rash  . Actos [Pioglitazone Hydrochloride] Other (See Comments)    Unknown  . Buspar [Buspirone Hcl] Other (See Comments)    Unknown  . Lipitor [Atorvastatin Calcium] Other (See Comments)    "It made me hurt all over."  . Penicillins Rash    Review of Systems  Constitutional: Positive for malaise/fatigue. Negative for fever.  HENT: Negative for congestion.   Eyes: Negative for blurred vision.  Respiratory: Negative for cough and shortness of breath.   Cardiovascular: Negative for chest pain, palpitations and leg swelling.  Gastrointestinal: Negative for vomiting.  Musculoskeletal: Positive for joint pain. Negative for  back pain.  Skin: Negative for rash.  Neurological: Negative for loss of consciousness and headaches.       Objective:    Physical Exam  Constitutional: She is oriented to person, place, and time.  She appears well-developed and well-nourished. No distress.  HENT:  Head: Normocephalic and atraumatic.  Eyes: Conjunctivae are normal.  Neck: Normal range of motion. No thyromegaly present.  Cardiovascular: Normal rate and regular rhythm.   Pulmonary/Chest: Effort normal and breath sounds normal. She has no wheezes.  Abdominal: Soft. Bowel sounds are normal. There is no tenderness.  Musculoskeletal: She exhibits deformity. She exhibits no edema.  Painful nodule DIP joint Right index finger  Neurological: She is alert and oriented to person, place, and time.  Skin: Skin is warm and dry. She is not diaphoretic.  Psychiatric: She has a normal mood and affect.    BP (!) 120/56 (BP Location: Left Arm, Patient Position: Sitting, Cuff Size: Normal)   Pulse 63   Ht 5\' 6"  (1.676 m)   Wt 143 lb 9.6 oz (65.1 kg)   SpO2 98%   BMI 23.18 kg/m  Wt Readings from Last 3 Encounters:  05/14/17 143 lb 9.6 oz (65.1 kg)  02/11/17 140 lb 9.6 oz (63.8 kg)  11/13/16 145 lb 8 oz (66 kg)   BP Readings from Last 3 Encounters:  05/14/17 (!) 120/56  02/11/17 (!) 130/50  11/13/16 104/62     Immunization History  Administered Date(s) Administered  . Influenza Whole 09/30/2010  . Influenza,inj,Quad PF,36+ Mos 08/24/2014, 11/13/2016  . Influenza-Unspecified 01/04/2014, 10/13/2015  . Pneumococcal Conjugate-13 09/14/2014  . Pneumococcal Polysaccharide-23 10/27/2015  . Td 01/01/2004  . Tdap 10/17/2011    Health Maintenance  Topic Date Due  . INFLUENZA VACCINE  07/31/2017  . HEMOGLOBIN A1C  11/07/2017  . FOOT EXAM  02/11/2018  . OPHTHALMOLOGY EXAM  04/03/2018  . TETANUS/TDAP  10/16/2021  . DEXA SCAN  Completed  . PNA vac Low Risk Adult  Completed    Lab Results  Component Value Date   WBC 5.2  05/07/2017   HGB 12.5 05/07/2017   HCT 38.5 05/07/2017   PLT 231.0 05/07/2017   GLUCOSE 104 (H) 05/07/2017   CHOL 158 05/07/2017   TRIG 153.0 (H) 05/07/2017   HDL 41.10 05/07/2017   LDLCALC 86 05/07/2017   ALT 8 05/07/2017   AST 16 05/07/2017   NA 137 05/07/2017   K 5.2 (H) 05/07/2017   CL 102 05/07/2017   CREATININE 1.89 (H) 05/07/2017   BUN 36 (H) 05/07/2017   CO2 31 05/07/2017   TSH 2.00 05/07/2017   HGBA1C 6.8 (H) 05/07/2017   MICROALBUR <0.7 02/07/2016    Lab Results  Component Value Date   TSH 2.00 05/07/2017   Lab Results  Component Value Date   WBC 5.2 05/07/2017   HGB 12.5 05/07/2017   HCT 38.5 05/07/2017   MCV 84.6 05/07/2017   PLT 231.0 05/07/2017   Lab Results  Component Value Date   NA 137 05/07/2017   K 5.2 (H) 05/07/2017   CO2 31 05/07/2017   GLUCOSE 104 (H) 05/07/2017   BUN 36 (H) 05/07/2017   CREATININE 1.89 (H) 05/07/2017   BILITOT 0.3 05/07/2017   ALKPHOS 64 05/07/2017   AST 16 05/07/2017   ALT 8 05/07/2017   PROT 7.0 05/07/2017   ALBUMIN 4.2 05/07/2017   CALCIUM 9.7 05/07/2017   GFR 26.61 (L) 05/07/2017   Lab Results  Component Value Date   CHOL 158 05/07/2017   Lab Results  Component Value Date   HDL 41.10 05/07/2017   Lab Results  Component Value Date   LDLCALC 86 05/07/2017   Lab Results  Component Value Date   TRIG 153.0 (H) 05/07/2017  Lab Results  Component Value Date   CHOLHDL 4 05/07/2017   Lab Results  Component Value Date   HGBA1C 6.8 (H) 05/07/2017         Assessment & Plan:   Problem List Items Addressed This Visit    Essential hypertension, benign    Well controlled, no changes to meds. Encouraged heart healthy diet such as the DASH diet and exercise as tolerated.       Relevant Medications   valsartan-hydrochlorothiazide (DIOVAN-HCT) 80-12.5 MG tablet   Tachycardia    RRR today      Type 2 diabetes mellitus with peripheral neuropathy (HCC)    hgba1c acceptable, minimize simple carbs.  Increase exercise as tolerated. Continue current meds      Relevant Medications   valsartan-hydrochlorothiazide (DIOVAN-HCT) 80-12.5 MG tablet   diazepam (VALIUM) 5 MG tablet   Chronic renal insufficiency    Creatinine increasing, encouraged increased hydration and Valsartan dose dropped from 160 mg to 80 mg daily and reassess cmp in 1 month      Hyperlipidemia, mixed    Tolerating statin, encouraged heart healthy diet, avoid trans fats, minimize simple carbs and saturated fats. Increase exercise as tolerated      Relevant Medications   valsartan-hydrochlorothiazide (DIOVAN-HCT) 80-12.5 MG tablet   Osteopenia    Encouraged to get adequate exercise, calcium and vitamin d intake      Vitamin B 12 deficiency    Given Vitamin B 12 shot today      Pain in finger of right hand    Encouraged to keep hands active, try topical lidocaine and if pain worsens call for further evaluation       Other Visit Diagnoses    Encounter for Medicare annual wellness exam    -  Primary   Anxiety       Relevant Medications   diazepam (VALIUM) 5 MG tablet   Vitamin B12 deficiency       Relevant Medications   cyanocobalamin ((VITAMIN B-12)) injection 1,000 mcg (Completed)      I have discontinued Dana Burns's valsartan-hydrochlorothiazide and pantoprazole. I have also changed her diazepam. Additionally, I am having her start on valsartan-hydrochlorothiazide and ranitidine. Lastly, I am having her maintain her calcium carbonate, Vitamin D3, acetaminophen, mupirocin ointment, triamcinolone cream, metFORMIN, pravastatin, gabapentin, citalopram, and HYDROcodone-acetaminophen. We administered cyanocobalamin. We will continue to administer cyanocobalamin.  Meds ordered this encounter  Medications  . valsartan-hydrochlorothiazide (DIOVAN-HCT) 80-12.5 MG tablet    Sig: Take 1 tablet by mouth daily.    Dispense:  30 tablet    Refill:  2  . diazepam (VALIUM) 5 MG tablet    Sig: TAKE 1 tab po qd AS NEEDED      Dispense:  30 tablet    Refill:  2    This request is for a new prescription for a controlled substance as required by Federal/State law.  . ranitidine (ZANTAC) 150 MG tablet    Sig: Take 1 tablet (150 mg total) by mouth at bedtime as needed for heartburn.    Dispense:  30 tablet    Refill:  2  . cyanocobalamin ((VITAMIN B-12)) injection 1,000 mcg    CMA served as scribe during this visit. History, Physical and Plan performed by medical provider. Documentation and orders reviewed and attested to.  Penni Homans, MD

## 2017-05-14 NOTE — Progress Notes (Signed)
Pre visit review using our clinic review tool, if applicable. No additional management support is needed unless otherwise documented below in the visit note. 

## 2017-05-15 ENCOUNTER — Encounter: Payer: Self-pay | Admitting: Family Medicine

## 2017-05-15 DIAGNOSIS — M79644 Pain in right finger(s): Secondary | ICD-10-CM | POA: Insufficient documentation

## 2017-05-15 HISTORY — DX: Pain in right finger(s): M79.644

## 2017-05-15 NOTE — Assessment & Plan Note (Signed)
RRR today 

## 2017-05-15 NOTE — Assessment & Plan Note (Signed)
Well controlled, no changes to meds. Encouraged heart healthy diet such as the DASH diet and exercise as tolerated.  °

## 2017-05-15 NOTE — Assessment & Plan Note (Signed)
Given Vitamin B 12 shot today 

## 2017-05-15 NOTE — Assessment & Plan Note (Signed)
Creatinine increasing, encouraged increased hydration and Valsartan dose dropped from 160 mg to 80 mg daily and reassess cmp in 1 month

## 2017-05-15 NOTE — Assessment & Plan Note (Signed)
Encouraged to get adequate exercise, calcium and vitamin d intake 

## 2017-05-15 NOTE — Assessment & Plan Note (Signed)
Encouraged to keep hands active, try topical lidocaine and if pain worsens call for further evaluation

## 2017-05-20 ENCOUNTER — Other Ambulatory Visit: Payer: Self-pay | Admitting: Family Medicine

## 2017-05-30 ENCOUNTER — Telehealth: Payer: Self-pay | Admitting: Family Medicine

## 2017-05-30 MED ORDER — PANTOPRAZOLE SODIUM 40 MG PO TBEC: DELAYED_RELEASE_TABLET | ORAL | 1 refills | Status: DC

## 2017-05-30 NOTE — Telephone Encounter (Signed)
I spoke to the patient and the other medication was ranitidine--she has stopped the ranitidine. Ok to restart pantoprazole

## 2017-05-30 NOTE — Telephone Encounter (Signed)
Caller name: Charlaine  Relation to pt: self  Call back number: [phone] Pharmacy: [facility]   Reason for call: Pt called wanting to know if she still has pantoprazole (PROTONIX) 40 MG tablet on her list to continue to take since she is having fluid issues still. Pt would like to let provider know that she wants to continue with this rx as usual. (Pt mentioned that was taken a med that provider gave her for another issue and at the pharmacy the told pt that she couldn't mix the other med with protonix and that is why she stop taken protonix- but now pt is not taken the other med and that she would like to continue with protonix like usual) Please advise.

## 2017-05-30 NOTE — Telephone Encounter (Signed)
OK to restart Pantoprazole with same strength, same sig, 30 with 5 or 90 with 1 rf at her discretion

## 2017-05-30 NOTE — Telephone Encounter (Signed)
Sent in as instructed and patient notified.

## 2017-06-13 ENCOUNTER — Other Ambulatory Visit: Payer: Self-pay | Admitting: Family Medicine

## 2017-06-13 MED ORDER — VALSARTAN-HYDROCHLOROTHIAZIDE 80-12.5 MG PO TABS: 1.0000 | ORAL_TABLET | Freq: Every day | ORAL | 0 refills | Status: DC

## 2017-06-17 ENCOUNTER — Other Ambulatory Visit: Payer: Self-pay | Admitting: Emergency Medicine

## 2017-06-17 DIAGNOSIS — R7989 Other specified abnormal findings of blood chemistry: Secondary | ICD-10-CM

## 2017-06-18 ENCOUNTER — Other Ambulatory Visit (INDEPENDENT_AMBULATORY_CARE_PROVIDER_SITE_OTHER): Payer: Medicare Other

## 2017-06-18 ENCOUNTER — Ambulatory Visit (INDEPENDENT_AMBULATORY_CARE_PROVIDER_SITE_OTHER): Payer: Medicare Other | Admitting: Family Medicine

## 2017-06-18 ENCOUNTER — Ambulatory Visit: Payer: Self-pay

## 2017-06-18 VITALS — BP 121/55 | HR 62

## 2017-06-18 DIAGNOSIS — E538 Deficiency of other specified B group vitamins: Secondary | ICD-10-CM | POA: Diagnosis not present

## 2017-06-18 DIAGNOSIS — R7989 Other specified abnormal findings of blood chemistry: Secondary | ICD-10-CM | POA: Diagnosis not present

## 2017-06-18 DIAGNOSIS — I10 Essential (primary) hypertension: Secondary | ICD-10-CM | POA: Diagnosis not present

## 2017-06-18 LAB — COMPREHENSIVE METABOLIC PANEL
ALT: 10 U/L (ref 0–35)
AST: 17 U/L (ref 0–37)
Albumin: 4.2 g/dL (ref 3.5–5.2)
Alkaline Phosphatase: 64 U/L (ref 39–117)
BUN: 31 mg/dL — AB (ref 6–23)
CHLORIDE: 104 meq/L (ref 96–112)
CO2: 30 meq/L (ref 19–32)
CREATININE: 1.35 mg/dL — AB (ref 0.40–1.20)
Calcium: 10 mg/dL (ref 8.4–10.5)
GFR: 39.22 mL/min — ABNORMAL LOW (ref >60.00)
Glucose, Bld: 90 mg/dL (ref 70–99)
Potassium: 5.3 mEq/L — ABNORMAL HIGH (ref 3.5–5.1)
SODIUM: 140 meq/L (ref 135–145)
Total Bilirubin: 0.3 mg/dL (ref 0.2–1.2)
Total Protein: 6.8 g/dL (ref 6.0–8.3)

## 2017-06-18 MED ORDER — CYANOCOBALAMIN 1000 MCG/ML IJ SOLN
1000.0000 ug | Freq: Once | INTRAMUSCULAR | Status: AC
Start: 2017-06-18 — End: 2017-06-18
  Administered 2017-06-18: 1000 ug via INTRAMUSCULAR

## 2017-06-18 NOTE — Progress Notes (Signed)
Pre visit review using our clinic review tool, if applicable. No additional management support is needed unless otherwise documented below in the visit note.  Patient in office for a blood pressure check per OV note 05/14/17. She voiced compliance with medication & regimen. Readings during today's visit were: BP 121/55 P 62. Patient is asymptomatic.  Per Dr. Charlett Blake: Decrease Valsartan-Hydrochlorothiazide (Diovan-HCT) to 40-12.5 mg once daily. Return in 1 month for a nurse visit to have blood pressure rechecked.  Informed patient of the provider's recommendations. She verbalized understanding. Next appointment 07/23/17 at 2:30 PM.  Patient also received B12 injection. IM injection was given in the left deltoid. Patient tolerated it well.   RN blood pressure check note reviewed. Agree with documention and plan.

## 2017-06-18 NOTE — Patient Instructions (Addendum)
Per Dr. Charlett Blake: Decrease Valsartan-Hydrochlorothiazide (Diovan-HCT) to 40-12.5 mg once daily. Return in 1 month for a nurse visit to have blood pressure rechecked.

## 2017-06-19 ENCOUNTER — Other Ambulatory Visit: Payer: Self-pay | Admitting: Family Medicine

## 2017-06-19 DIAGNOSIS — E876 Hypokalemia: Secondary | ICD-10-CM

## 2017-06-19 NOTE — Progress Notes (Signed)
RN blood pressure check note reviewed. Agree with documention and plan. 

## 2017-06-20 ENCOUNTER — Other Ambulatory Visit: Payer: Self-pay | Admitting: Family Medicine

## 2017-06-20 MED ORDER — LACTULOSE 10 GM/15ML PO SOLN: 10.0000 g | Freq: Every day | ORAL | 2 refills | Status: DC

## 2017-07-23 ENCOUNTER — Other Ambulatory Visit (INDEPENDENT_AMBULATORY_CARE_PROVIDER_SITE_OTHER): Payer: Medicare Other

## 2017-07-23 ENCOUNTER — Ambulatory Visit (INDEPENDENT_AMBULATORY_CARE_PROVIDER_SITE_OTHER): Payer: Medicare Other | Admitting: Family Medicine

## 2017-07-23 VITALS — BP 126/71 | HR 58

## 2017-07-23 DIAGNOSIS — E876 Hypokalemia: Secondary | ICD-10-CM | POA: Diagnosis not present

## 2017-07-23 DIAGNOSIS — E538 Deficiency of other specified B group vitamins: Secondary | ICD-10-CM | POA: Diagnosis not present

## 2017-07-23 DIAGNOSIS — I10 Essential (primary) hypertension: Secondary | ICD-10-CM | POA: Diagnosis not present

## 2017-07-23 MED ORDER — CYANOCOBALAMIN 1000 MCG/ML IJ SOLN
1000.0000 ug | Freq: Once | INTRAMUSCULAR | Status: AC
  Administered 2017-07-23: 1000 ug via INTRAMUSCULAR

## 2017-07-23 NOTE — Patient Instructions (Addendum)
Per Dr. Carollee Herter: Continue current medication & regimen. Keep follow-up appointment with PCP on 09/24/17 at 2:00 PM.

## 2017-07-23 NOTE — Progress Notes (Signed)
Pre visit review using our clinic review tool, if applicable. No additional management support is needed unless otherwise documented below in the visit note.  Patient presented in office for blood pressure recheck per OV note 06/18/17. She verbalized adherence to medication. Patient is asymptomatic with new regimen. Today's readings were as follow: BP 126/71 P 58 & 02 95%.  Also, monthly B12 injection was administered to the patient during the visit; she tolerated the injection well.  Per Dr. Carollee Herter: Continue current medication & regimen. Keep follow-up appointment with PCP on 09/24/17 at 2:00 PM.  Informed patient of the provider's recommendations. She voiced understanding and did not have any further questions or concerns before leaving the nurse visit.   .reviewed Ann Held, DO

## 2017-07-24 ENCOUNTER — Other Ambulatory Visit: Payer: Self-pay | Admitting: Family Medicine

## 2017-07-24 ENCOUNTER — Encounter: Payer: Self-pay | Admitting: Family Medicine

## 2017-07-24 ENCOUNTER — Other Ambulatory Visit: Payer: Self-pay

## 2017-07-24 DIAGNOSIS — E875 Hyperkalemia: Secondary | ICD-10-CM

## 2017-07-24 DIAGNOSIS — E119 Type 2 diabetes mellitus without complications: Secondary | ICD-10-CM | POA: Diagnosis not present

## 2017-07-24 DIAGNOSIS — Z1231 Encounter for screening mammogram for malignant neoplasm of breast: Secondary | ICD-10-CM

## 2017-07-24 LAB — COMPREHENSIVE METABOLIC PANEL
ALT: 9 U/L (ref 0–35)
AST: 16 U/L (ref 0–37)
Albumin: 4.3 g/dL (ref 3.5–5.2)
Alkaline Phosphatase: 55 U/L (ref 39–117)
BUN: 32 mg/dL — ABNORMAL HIGH (ref 6–23)
CALCIUM: 9.9 mg/dL (ref 8.4–10.5)
CHLORIDE: 103 meq/L (ref 96–112)
CO2: 30 meq/L (ref 19–32)
Creatinine, Ser: 1.54 mg/dL — ABNORMAL HIGH (ref 0.40–1.20)
GFR: 33.69 mL/min — AB (ref >60.00)
GLUCOSE: 92 mg/dL (ref 70–99)
POTASSIUM: 5.4 meq/L — AB (ref 3.5–5.1)
Sodium: 138 mEq/L (ref 135–145)
Total Bilirubin: 0.3 mg/dL (ref 0.2–1.2)
Total Protein: 7.3 g/dL (ref 6.0–8.3)

## 2017-07-24 MED ORDER — HYDROCHLOROTHIAZIDE 12.5 MG PO TABS: 12.5000 mg | ORAL_TABLET | Freq: Every day | ORAL | 0 refills | Status: DC

## 2017-07-30 ENCOUNTER — Ambulatory Visit
Admission: RE | Admit: 2017-07-30 | Discharge: 2017-07-30 | Disposition: A | Discharge status: Home / Self Care | Payer: Medicare Other | Source: Ambulatory Visit | Attending: Family Medicine | Admitting: Family Medicine

## 2017-07-30 DIAGNOSIS — Z1231 Encounter for screening mammogram for malignant neoplasm of breast: Secondary | ICD-10-CM | POA: Diagnosis not present

## 2017-07-31 ENCOUNTER — Other Ambulatory Visit (INDEPENDENT_AMBULATORY_CARE_PROVIDER_SITE_OTHER): Payer: Medicare Other

## 2017-07-31 DIAGNOSIS — E875 Hyperkalemia: Secondary | ICD-10-CM

## 2017-07-31 LAB — BASIC METABOLIC PANEL
BUN: 24 mg/dL — ABNORMAL HIGH (ref 6–23)
CHLORIDE: 101 meq/L (ref 96–112)
CO2: 32 meq/L (ref 19–32)
Calcium: 9.7 mg/dL (ref 8.4–10.5)
Creatinine, Ser: 1.38 mg/dL — ABNORMAL HIGH (ref 0.40–1.20)
GFR: 38.23 mL/min — ABNORMAL LOW (ref >60.00)
Glucose, Bld: 96 mg/dL (ref 70–99)
POTASSIUM: 5.1 meq/L (ref 3.5–5.1)
Sodium: 139 mEq/L (ref 135–145)

## 2017-08-01 ENCOUNTER — Other Ambulatory Visit: Payer: Self-pay | Admitting: Family Medicine

## 2017-08-01 DIAGNOSIS — R928 Other abnormal and inconclusive findings on diagnostic imaging of breast: Secondary | ICD-10-CM

## 2017-08-02 ENCOUNTER — Telehealth: Payer: Self-pay | Admitting: Family Medicine

## 2017-08-02 NOTE — Telephone Encounter (Signed)
°  Relation to PE:AKLT Call back [place]   Reason for call:  Patient returning call regarding lab results

## 2017-08-02 NOTE — Telephone Encounter (Signed)
See 07/31/17 result note.

## 2017-08-06 ENCOUNTER — Ambulatory Visit
Admission: RE | Admit: 2017-08-06 | Discharge: 2017-08-06 | Disposition: A | Discharge status: Home / Self Care | Payer: Medicare Other | Source: Ambulatory Visit | Attending: Family Medicine | Admitting: Family Medicine

## 2017-08-06 DIAGNOSIS — R928 Other abnormal and inconclusive findings on diagnostic imaging of breast: Secondary | ICD-10-CM | POA: Diagnosis not present

## 2017-08-06 DIAGNOSIS — N6489 Other specified disorders of breast: Secondary | ICD-10-CM | POA: Diagnosis not present

## 2017-08-20 ENCOUNTER — Ambulatory Visit (INDEPENDENT_AMBULATORY_CARE_PROVIDER_SITE_OTHER): Payer: Medicare Other | Admitting: Behavioral Health

## 2017-08-20 DIAGNOSIS — E538 Deficiency of other specified B group vitamins: Secondary | ICD-10-CM

## 2017-08-20 MED ORDER — CYANOCOBALAMIN 1000 MCG/ML IJ SOLN
1000.0000 ug | Freq: Once | INTRAMUSCULAR | Status: AC
  Administered 2017-08-20: 1000 ug via INTRAMUSCULAR

## 2017-08-20 NOTE — Progress Notes (Signed)
Pre visit review using our clinic review tool, if applicable. No additional management support is needed unless otherwise documented below in the visit note.  Patient came in office for monthly B12 injection. IM injection was given in the left deltoid. Patient tolerated it well. Next appointment 09/24/17 at 2:00 PM.

## 2017-08-24 ENCOUNTER — Other Ambulatory Visit: Payer: Self-pay | Admitting: Family Medicine

## 2017-09-04 ENCOUNTER — Other Ambulatory Visit: Payer: Self-pay | Admitting: Family Medicine

## 2017-09-24 ENCOUNTER — Ambulatory Visit (INDEPENDENT_AMBULATORY_CARE_PROVIDER_SITE_OTHER): Payer: Medicare Other | Admitting: Family Medicine

## 2017-09-24 ENCOUNTER — Encounter: Payer: Self-pay | Admitting: Family Medicine

## 2017-09-24 VITALS — BP 116/63 | HR 66 | Temp 98.4°F | Ht 66.0 in | Wt 142.0 lb

## 2017-09-24 DIAGNOSIS — E782 Mixed hyperlipidemia: Secondary | ICD-10-CM

## 2017-09-24 DIAGNOSIS — N189 Chronic kidney disease, unspecified: Secondary | ICD-10-CM

## 2017-09-24 DIAGNOSIS — E1142 Type 2 diabetes mellitus with diabetic polyneuropathy: Secondary | ICD-10-CM | POA: Diagnosis not present

## 2017-09-24 DIAGNOSIS — K5900 Constipation, unspecified: Secondary | ICD-10-CM | POA: Diagnosis not present

## 2017-09-24 DIAGNOSIS — R000 Tachycardia, unspecified: Secondary | ICD-10-CM

## 2017-09-24 DIAGNOSIS — Z23 Encounter for immunization: Secondary | ICD-10-CM | POA: Diagnosis not present

## 2017-09-24 DIAGNOSIS — Z0283 Encounter for blood-alcohol and blood-drug test: Secondary | ICD-10-CM

## 2017-09-24 DIAGNOSIS — R109 Unspecified abdominal pain: Secondary | ICD-10-CM | POA: Diagnosis not present

## 2017-09-24 DIAGNOSIS — M8580 Other specified disorders of bone density and structure, unspecified site: Secondary | ICD-10-CM

## 2017-09-24 DIAGNOSIS — I10 Essential (primary) hypertension: Secondary | ICD-10-CM

## 2017-09-24 DIAGNOSIS — E538 Deficiency of other specified B group vitamins: Secondary | ICD-10-CM

## 2017-09-24 DIAGNOSIS — Z79899 Other long term (current) drug therapy: Secondary | ICD-10-CM | POA: Diagnosis not present

## 2017-09-24 MED ORDER — HYDROCODONE-ACETAMINOPHEN 5-325 MG PO TABS: 1.0000 | ORAL_TABLET | Freq: Four times a day (QID) | ORAL | 0 refills | Status: DC | PRN

## 2017-09-24 NOTE — Assessment & Plan Note (Signed)
Well controlled, no changes to meds. Encouraged heart healthy diet such as the DASH diet and exercise as tolerated.  °

## 2017-09-24 NOTE — Patient Instructions (Addendum)
Add Miralax and Benefiber together once or twice daily   Hypertension Hypertension, commonly called high blood pressure, is when the force of blood pumping through the arteries is too strong. The arteries are the blood vessels that carry blood from the heart throughout the body. Hypertension forces the heart to work harder to pump blood and may cause arteries to become narrow or stiff. Having untreated or uncontrolled hypertension can cause heart attacks, strokes, kidney disease, and other problems. A blood pressure reading consists of a higher number over a lower number. Ideally, your blood pressure should be below 120/80. The first ("top") number is called the systolic pressure. It is a measure of the pressure in your arteries as your heart beats. The second ("bottom") number is called the diastolic pressure. It is a measure of the pressure in your arteries as the heart relaxes. What are the causes? The cause of this condition is not known. What increases the risk? Some risk factors for high blood pressure are under your control. Others are not. Factors you can change  Smoking.  Having type 2 diabetes mellitus, high cholesterol, or both.  Not getting enough exercise or physical activity.  Being overweight.  Having too much fat, sugar, calories, or salt (sodium) in your diet.  Drinking too much alcohol. Factors that are difficult or impossible to change  Having chronic kidney disease.  Having a family history of high blood pressure.  Age. Risk increases with age.  Race. You may be at higher risk if you are African-American.  Gender. Men are at higher risk than women before age 68. After age 15, women are at higher risk than men.  Having obstructive sleep apnea.  Stress. What are the signs or symptoms? Extremely high blood pressure (hypertensive crisis) may cause:  Headache.  Anxiety.  Shortness of breath.  Nosebleed.  Nausea and vomiting.  Severe chest  pain.  Jerky movements you cannot control (seizures).  How is this diagnosed? This condition is diagnosed by measuring your blood pressure while you are seated, with your arm resting on a surface. The cuff of the blood pressure monitor will be placed directly against the skin of your upper arm at the level of your heart. It should be measured at least twice using the same arm. Certain conditions can cause a difference in blood pressure between your right and left arms. Certain factors can cause blood pressure readings to be lower or higher than normal (elevated) for a short period of time:  When your blood pressure is higher when you are in a health care provider's office than when you are at home, this is called white coat hypertension. Most people with this condition do not need medicines.  When your blood pressure is higher at home than when you are in a health care provider's office, this is called masked hypertension. Most people with this condition may need medicines to control blood pressure.  If you have a high blood pressure reading during one visit or you have normal blood pressure with other risk factors:  You may be asked to return on a different day to have your blood pressure checked again.  You may be asked to monitor your blood pressure at home for 1 week or longer.  If you are diagnosed with hypertension, you may have other blood or imaging tests to help your health care provider understand your overall risk for other conditions. How is this treated? This condition is treated by making healthy lifestyle changes, such as  eating healthy foods, exercising more, and reducing your alcohol intake. Your health care provider may prescribe medicine if lifestyle changes are not enough to get your blood pressure under control, and if:  Your systolic blood pressure is above 130.  Your diastolic blood pressure is above 80.  Your personal target blood pressure may vary depending on your  medical conditions, your age, and other factors. Follow these instructions at home: Eating and drinking  Eat a diet that is high in fiber and potassium, and low in sodium, added sugar, and fat. An example eating plan is called the DASH (Dietary Approaches to Stop Hypertension) diet. To eat this way: ? Eat plenty of fresh fruits and vegetables. Try to fill half of your plate at each meal with fruits and vegetables. ? Eat whole grains, such as whole wheat pasta, brown rice, or whole grain bread. Fill about one quarter of your plate with whole grains. ? Eat or drink low-fat dairy products, such as skim milk or low-fat yogurt. ? Avoid fatty cuts of meat, processed or cured meats, and poultry with skin. Fill about one quarter of your plate with lean proteins, such as fish, chicken without skin, beans, eggs, and tofu. ? Avoid premade and processed foods. These tend to be higher in sodium, added sugar, and fat.  Reduce your daily sodium intake. Most people with hypertension should eat less than 1,500 mg of sodium a day.  Limit alcohol intake to no more than 1 drink a day for nonpregnant women and 2 drinks a day for men. One drink equals 12 oz of beer, 5 oz of wine, or 1 oz of hard liquor. Lifestyle  Work with your health care provider to maintain a healthy body weight or to lose weight. Ask what an ideal weight is for you.  Get at least 30 minutes of exercise that causes your heart to beat faster (aerobic exercise) most days of the week. Activities may include walking, swimming, or biking.  Include exercise to strengthen your muscles (resistance exercise), such as pilates or lifting weights, as part of your weekly exercise routine. Try to do these types of exercises for 30 minutes at least 3 days a week.  Do not use any products that contain nicotine or tobacco, such as cigarettes and e-cigarettes. If you need help quitting, ask your health care provider.  Monitor your blood pressure at home as  told by your health care provider.  Keep all follow-up visits as told by your health care provider. This is important. Medicines  Take over-the-counter and prescription medicines only as told by your health care provider. Follow directions carefully. Blood pressure medicines must be taken as prescribed.  Do not skip doses of blood pressure medicine. Doing this puts you at risk for problems and can make the medicine less effective.  Ask your health care provider about side effects or reactions to medicines that you should watch for. Contact a health care provider if:  You think you are having a reaction to a medicine you are taking.  You have headaches that keep coming back (recurring).  You feel dizzy.  You have swelling in your ankles.  You have trouble with your vision. Get help right away if:  You develop a severe headache or confusion.  You have unusual weakness or numbness.  You feel faint.  You have severe pain in your chest or abdomen.  You vomit repeatedly.  You have trouble breathing. Summary  Hypertension is when the force of blood pumping  through your arteries is too strong. If this condition is not controlled, it may put you at risk for serious complications.  Your personal target blood pressure may vary depending on your medical conditions, your age, and other factors. For most people, a normal blood pressure is less than 120/80.  Hypertension is treated with lifestyle changes, medicines, or a combination of both. Lifestyle changes include weight loss, eating a healthy, low-sodium diet, exercising more, and limiting alcohol. This information is not intended to replace advice given to you by your health care provider. Make sure you discuss any questions you have with your health care provider. Document Released: 12/17/2005 Document Revised: 11/14/2016 Document Reviewed: 11/14/2016 Elsevier Interactive Patient Education  [employer].

## 2017-09-24 NOTE — Assessment & Plan Note (Signed)
Encouraged heart healthy diet, increase exercise, avoid trans fats, consider a krill oil cap daily 

## 2017-09-24 NOTE — Assessment & Plan Note (Signed)
hgba1c acceptable, minimize simple carbs. Increase exercise as tolerated.  

## 2017-09-24 NOTE — Assessment & Plan Note (Signed)
Encouraged to get adequate exercise, calcium and vitamin d intake 

## 2017-09-24 NOTE — Assessment & Plan Note (Signed)
RRR today 

## 2017-09-24 NOTE — Assessment & Plan Note (Signed)
Check cmp 

## 2017-09-24 NOTE — Progress Notes (Signed)
Subjective:  I acted as a Education administrator for Dr. Charlett Blake. [place], [place]  Patient ID: Dana Burns, female    DOB: 1926-04-04, 83 y.o.   MRN: [id number]  Chief Complaint  Patient presents with  . Follow-up    HPI  Patient is in today for a follow up. She is noting some ongoing tension and pain in neck and shoulders. No falls or recent injury. She is complaining of some stiffness in fingers but also some tingling in fingers as well. Notes blood pressures in the 110 to 140 range at home. Denies CP/palp/SOB/HA/congestion/fevers/GI or GU c/o. Taking meds as prescribed  Patient Care Team: Mosie Lukes, MD as PCP - General (Family Medicine) Marica Otter, [place] as Consulting Physician (Optometry) Garlan Fair, MD as Consulting Physician (Gastroenterology)   Past Medical History:  Diagnosis Date  . Allergy    sneeze, rhinorrhea in am  . Chicken pox as a child  . Chronic pain syndrome 01/23/2015  . Colon cancer ([place])   . Complication of anesthesia    agitated when waking up, wakes up shaking, "like  I'm going into shock"  . Depression    husband died  3 months ago, pt. admits depression  currently   . Diabetes mellitus    6 years  . DM (diabetes mellitus) type 2, uncontrolled, with ketoacidosis ([place]) 03/17/2011  . Fibromyalgia   . GERD (gastroesophageal reflux disease)   . H/O echocardiogram    done /w [place] in 2011, saw Dr. Percival Spanish for tachycardia   . Headache(784.0)    occasional - "bad headaches"  . Hyperlipidemia    6 years  . Hypertension    15 years, reports she has a rapid heartrate   . Measles as a child  . Osteoarthritis    in hands & all over, feet  . Pain in finger of right hand 05/15/2017  . Pain of right shoulder region 07/07/2014  . Peripheral neuropathy   . Shortness of breath   . Type 2 diabetes mellitus with peripheral neuropathy ([place]) 03/17/2011    Past Surgical History:  Procedure Laterality Date  . BREAST EXCISIONAL BIOPSY Right 2013   benign  . BREAST  SURGERY  2013   right- benign  . CATARACT EXTRACTION     /w IOL- bilateral   . COLON SURGERY     For resection of cancer  . Nodule removed     From throat    Family History  Problem Relation Age of Onset  . Cancer Mother        Brain  . Cancer Father        Colorectal  . Cancer Sister        breast  . Other Brother        pacemaker  . Cancer Brother        pancreatic cancer  . Arthritis Sister   . Emphysema Brother   . Cancer Brother   . COPD Brother   . Neuropathy Daughter   . Fibromyalgia Daughter   . Heart disease Neg Hx        Early    Social History   Social History  . Marital status: Widowed    Spouse name: N/A  . Number of children: 1  . Years of education: 9   Occupational History  . Retired    Social History Main Topics  . Smoking status: Never Smoker  . Smokeless tobacco: Never Used  . Alcohol use No  . Drug use:  No  . Sexual activity: No     Comment: lives with daughter. no dietary restrictions   Other Topics Concern  . Not on file   Social History Narrative   Patient lives at home with daughter.    Patient is retired.    Patient is widowed.    Patient has 1 child.    Patient has a 9th grade education.     Outpatient Medications Prior to Visit  Medication Sig Dispense Refill  . calcium carbonate (OS-CAL) 600 MG TABS Take 600 mg by mouth 2 (two) times daily with a meal.      . Cholecalciferol (VITAMIN D3) 5000 UNITS CAPS Take 5,000 Units by mouth daily.    . citalopram (CELEXA) 10 MG tablet Take 1 tablet (10 mg total) by mouth daily. 90 tablet 1  . diazepam (VALIUM) 5 MG tablet TAKE 1 tab po qd AS NEEDED 30 tablet 2  . gabapentin (NEURONTIN) 800 MG tablet Take 1 tablet (800 mg total) by mouth 3 (three) times daily. 90 tablet 1  . hydrochlorothiazide (HYDRODIURIL) 12.5 MG tablet Take 1 tablet (12.5 mg total) by mouth daily. 90 tablet 0  . lactulose (CHRONULAC) 10 GM/15ML solution Take 15 mLs (10 g total) by mouth daily. 240 mL 2  .  pantoprazole (PROTONIX) 40 MG tablet TAKE 1 TABLET (40 MG TOTAL) BY MOUTH DAILY. 90 tablet 1  . pravastatin (PRAVACHOL) 40 MG tablet TAKE 1 TABLET BY MOUTH AT BEDTIME 90 tablet 1  . ranitidine (ZANTAC) 150 MG tablet Take 1 tablet (150 mg total) by mouth at bedtime as needed for heartburn. 30 tablet 2  . acetaminophen (TYLENOL) 500 MG tablet Take 1,000 mg by mouth 2 (two) times daily.    Marland Kitchen HYDROcodone-acetaminophen (NORCO) 5-325 MG tablet Take 1 tablet by mouth every 6 (six) hours as needed for moderate pain. 90 tablet 0  . mupirocin ointment (BACTROBAN) 2 % Place 1 application into the nose daily. 22 g 1  . triamcinolone cream (KENALOG) 0.1 % Apply 1 application topically 2 (two) times daily as needed. 45 g 1  . metFORMIN (GLUCOPHAGE) 500 MG tablet Take 1 tablet (500 mg total) by mouth once. 90 tablet 3   Facility-Administered Medications Prior to Visit  Medication Dose Route Frequency Provider Last Rate Last Dose  . cyanocobalamin ((VITAMIN B-12)) injection 1,000 mcg  1,000 mcg Intramuscular Once Mosie Lukes, MD        Allergies  Allergen Reactions  . Tape Itching and Rash  . Actos [Pioglitazone Hydrochloride] Other (See Comments)    Unknown  . Buspar [Buspirone Hcl] Other (See Comments)    Unknown  . Lipitor [Atorvastatin Calcium] Other (See Comments)    "It made me hurt all over."  . Penicillins Rash    Review of Systems  Constitutional: Negative for fever and malaise/fatigue.  HENT: Negative for congestion.   Eyes: Negative for blurred vision.  Respiratory: Negative for shortness of breath.   Cardiovascular: Negative for chest pain, palpitations and leg swelling.  Gastrointestinal: Negative for abdominal pain, blood in stool and nausea.  Genitourinary: Negative for dysuria and frequency.  Musculoskeletal: Positive for joint pain and neck pain. Negative for falls.  Skin: Negative for rash.  Neurological: Positive for sensory change. Negative for dizziness, loss of  consciousness and headaches.  Endo/Heme/Allergies: Negative for environmental allergies.  Psychiatric/Behavioral: Negative for depression. The patient is not nervous/anxious.        Objective:    Physical Exam  Constitutional: She is oriented  to person, place, and time. She appears well-developed and well-nourished. No distress.  HENT:  Head: Normocephalic and atraumatic.  Nose: Nose normal.  Eyes: Right eye exhibits no discharge. Left eye exhibits no discharge.  Neck: Normal range of motion. Neck supple.  Cardiovascular: Normal rate and regular rhythm.   No murmur heard. Pulmonary/Chest: Effort normal and breath sounds normal.  Abdominal: Soft. Bowel sounds are normal. There is no tenderness.  Musculoskeletal: She exhibits no edema.  Neurological: She is alert and oriented to person, place, and time.  Skin: Skin is warm and dry.  Psychiatric: She has a normal mood and affect.  Nursing note and vitals reviewed.   BP 116/63   Pulse 66   Temp 98.4 F (36.9 C) (Oral)   Ht 5\' 6"  (1.676 m)   Wt 142 lb (64.4 kg)   SpO2 97%   BMI 22.92 kg/m  Wt Readings from Last 3 Encounters:  09/24/17 142 lb (64.4 kg)  05/14/17 143 lb 9.6 oz (65.1 kg)  02/11/17 140 lb 9.6 oz (63.8 kg)   BP Readings from Last 3 Encounters:  09/24/17 116/63  07/23/17 126/71  06/18/17 (!) 121/55     Immunization History  Administered Date(s) Administered  . Influenza Whole 09/30/2010  . Influenza, High Dose Seasonal PF 09/24/2017  . Influenza,inj,Quad PF,6+ Mos 08/24/2014, 11/13/2016  . Influenza-Unspecified 01/04/2014, 10/13/2015  . Pneumococcal Conjugate-13 09/14/2014  . Pneumococcal Polysaccharide-23 10/27/2015  . Td 01/01/2004  . Tdap 10/17/2011    Health Maintenance  Topic Date Due  . URINE MICROALBUMIN  02/06/2017  . FOOT EXAM  02/11/2018  . HEMOGLOBIN A1C  03/24/2018  . OPHTHALMOLOGY EXAM  04/03/2018  . TETANUS/TDAP  10/16/2021  . INFLUENZA VACCINE  Completed  . DEXA SCAN  Completed    . PNA vac Low Risk Adult  Completed    Lab Results  Component Value Date   WBC 6.3 09/24/2017   HGB 12.5 09/24/2017   HCT 39.3 09/24/2017   PLT 246.0 09/24/2017   GLUCOSE 93 09/24/2017   CHOL 173 09/24/2017   TRIG 162.0 (H) 09/24/2017   HDL 44.40 09/24/2017   LDLCALC 97 09/24/2017   ALT 9 09/24/2017   AST 17 09/24/2017   NA 139 09/24/2017   K 4.7 09/24/2017   CL 102 09/24/2017   CREATININE 1.32 (H) 09/24/2017   BUN 24 (H) 09/24/2017   CO2 30 09/24/2017   TSH 3.61 09/24/2017   HGBA1C 6.7 (H) 09/24/2017   MICROALBUR <0.7 02/07/2016    Lab Results  Component Value Date   TSH 3.61 09/24/2017   Lab Results  Component Value Date   WBC 6.3 09/24/2017   HGB 12.5 09/24/2017   HCT 39.3 09/24/2017   MCV 85.5 09/24/2017   PLT 246.0 09/24/2017   Lab Results  Component Value Date   NA 139 09/24/2017   K 4.7 09/24/2017   CO2 30 09/24/2017   GLUCOSE 93 09/24/2017   BUN 24 (H) 09/24/2017   CREATININE 1.32 (H) 09/24/2017   BILITOT 0.4 09/24/2017   ALKPHOS 63 09/24/2017   AST 17 09/24/2017   ALT 9 09/24/2017   PROT 7.2 09/24/2017   ALBUMIN 4.2 09/24/2017   CALCIUM 9.7 09/24/2017   GFR 40.23 (L) 09/24/2017   Lab Results  Component Value Date   CHOL 173 09/24/2017   Lab Results  Component Value Date   HDL 44.40 09/24/2017   Lab Results  Component Value Date   LDLCALC 97 09/24/2017   Lab Results  Component Value  Date   TRIG 162.0 (H) 09/24/2017   Lab Results  Component Value Date   CHOLHDL 4 09/24/2017   Lab Results  Component Value Date   HGBA1C 6.7 (H) 09/24/2017         Assessment & Plan:   Problem List Items Addressed This Visit    Essential hypertension, benign    Well controlled, no changes to meds. Encouraged heart healthy diet such as the DASH diet and exercise as tolerated.       Relevant Orders   CBC (Completed)   Comprehensive metabolic panel (Completed)   TSH (Completed)   Tachycardia    RRR today      Type 2 diabetes mellitus  with peripheral neuropathy ([facility])    hgba1c acceptable, minimize simple carbs. Increase exercise as tolerated      Relevant Orders   Microalbumin / creatinine urine ratio   Hemoglobin A1c (Completed)   Chronic renal insufficiency    Check cmp      Hyperlipidemia, mixed    Encouraged heart healthy diet, increase exercise, avoid trans fats, consider a krill oil cap daily      Relevant Orders   Lipid panel (Completed)   Osteopenia    Encouraged to get adequate exercise, calcium and vitamin d intake      Vitamin B 12 deficiency    Took shot today check level      Relevant Orders   Vitamin B12 (Completed)   Abdominal pain    Abdominal xray ordered. Encouraged increased hydration and fiber in diet. Daily probiotics. If bowels not moving can use MOM 2 tbls po in 4 oz of warm prune juice by mouth every 2-3 days. If no results then repeat in 4 hours with  Dulcolax suppository pr, may repeat again in 4 more hours as needed. Seek care if symptoms worsen. Consider daily Miralax and/or Dulcolax if symptoms persist.       Relevant Orders   DG Abd 2 Views   Hemoglobin A1c (Completed)   Constipation    Other Visit Diagnoses    Need for immunization against influenza    -  Primary   Relevant Orders   Flu vaccine HIGH DOSE PF (Fluzone High dose) (Completed)   Encounter for drug screening       Relevant Orders   Pain Mgmt, Profile 8 w/Conf, U      I have discontinued Ms. Whitworth's acetaminophen, mupirocin ointment, and triamcinolone cream. I am also having her maintain her calcium carbonate, Vitamin D3, metFORMIN, citalopram, diazepam, ranitidine, pantoprazole, lactulose, hydrochlorothiazide, gabapentin, pravastatin, and HYDROcodone-acetaminophen. We will continue to administer cyanocobalamin.  Meds ordered this encounter  Medications  . HYDROcodone-acetaminophen (NORCO) 5-325 MG tablet    Sig: Take 1 tablet by mouth every 6 (six) hours as needed for moderate pain.    Dispense:  90  tablet    Refill:  0    CMA served as scribe during this visit. History, Physical and Plan performed by medical provider. Documentation and orders reviewed and attested to.  Penni Homans, MD

## 2017-09-24 NOTE — Assessment & Plan Note (Signed)
Took shot today check level

## 2017-09-25 LAB — CBC
HCT: 39.3 % (ref 36.0–46.0)
Hemoglobin: 12.5 g/dL (ref 12.0–15.0)
MCHC: 31.7 g/dL (ref 30.0–36.0)
MCV: 85.5 fl (ref 78.0–100.0)
Platelets: 246 10*3/uL (ref 150.0–400.0)
RBC: 4.59 Mil/uL (ref 3.87–5.11)
RDW: 15.2 % (ref 11.5–15.5)
WBC: 6.3 10*3/uL (ref 4.0–10.5)

## 2017-09-25 LAB — LIPID PANEL
CHOL/HDL RATIO: 4
Cholesterol: 173 mg/dL (ref 0–200)
HDL: 44.4 mg/dL (ref >39.00)
LDL CALC: 97 mg/dL (ref 0–99)
NONHDL: 128.92
Triglycerides: 162 mg/dL — ABNORMAL HIGH (ref 0.0–149.0)
VLDL: 32.4 mg/dL (ref 0.0–40.0)

## 2017-09-25 LAB — COMPREHENSIVE METABOLIC PANEL
ALT: 9 U/L (ref 0–35)
AST: 17 U/L (ref 0–37)
Albumin: 4.2 g/dL (ref 3.5–5.2)
Alkaline Phosphatase: 63 U/L (ref 39–117)
BUN: 24 mg/dL — AB (ref 6–23)
CHLORIDE: 102 meq/L (ref 96–112)
CO2: 30 meq/L (ref 19–32)
CREATININE: 1.32 mg/dL — AB (ref 0.40–1.20)
Calcium: 9.7 mg/dL (ref 8.4–10.5)
GFR: 40.23 mL/min — ABNORMAL LOW (ref >60.00)
Glucose, Bld: 93 mg/dL (ref 70–99)
Potassium: 4.7 mEq/L (ref 3.5–5.1)
Sodium: 139 mEq/L (ref 135–145)
Total Bilirubin: 0.4 mg/dL (ref 0.2–1.2)
Total Protein: 7.2 g/dL (ref 6.0–8.3)

## 2017-09-25 LAB — TSH: TSH: 3.61 u[IU]/mL (ref 0.35–4.50)

## 2017-09-25 LAB — HEMOGLOBIN A1C: HEMOGLOBIN A1C: 6.7 % — AB (ref 4.6–6.5)

## 2017-09-25 LAB — VITAMIN B12

## 2017-09-29 DIAGNOSIS — R109 Unspecified abdominal pain: Secondary | ICD-10-CM

## 2017-09-29 DIAGNOSIS — K5900 Constipation, unspecified: Secondary | ICD-10-CM

## 2017-09-29 HISTORY — DX: Constipation, unspecified: K59.00

## 2017-09-29 HISTORY — DX: Unspecified abdominal pain: R10.9

## 2017-09-29 NOTE — Assessment & Plan Note (Signed)
Abdominal xray ordered. Encouraged increased hydration and fiber in diet. Daily probiotics. If bowels not moving can use MOM 2 tbls po in 4 oz of warm prune juice by mouth every 2-3 days. If no results then repeat in 4 hours with  Dulcolax suppository pr, may repeat again in 4 more hours as needed. Seek care if symptoms worsen. Consider daily Miralax and/or Dulcolax if symptoms persist.

## 2017-09-30 ENCOUNTER — Other Ambulatory Visit: Payer: Self-pay | Admitting: Family Medicine

## 2017-09-30 ENCOUNTER — Ambulatory Visit (HOSPITAL_BASED_OUTPATIENT_CLINIC_OR_DEPARTMENT_OTHER)
Admission: RE | Admit: 2017-09-30 | Discharge: 2017-09-30 | Disposition: A | Discharge status: Home / Self Care | Payer: Medicare Other | Source: Ambulatory Visit | Attending: Family Medicine | Admitting: Family Medicine

## 2017-09-30 DIAGNOSIS — K5900 Constipation, unspecified: Secondary | ICD-10-CM | POA: Diagnosis not present

## 2017-09-30 DIAGNOSIS — R109 Unspecified abdominal pain: Secondary | ICD-10-CM | POA: Insufficient documentation

## 2017-09-30 NOTE — Addendum Note (Signed)
Addended by: Caffie Pinto on: 09/30/2017 02:22 PM   Modules accepted: Orders

## 2017-10-01 LAB — MICROALBUMIN / CREATININE URINE RATIO
CREATININE, U: 37.8 mg/dL
MICROALB/CREAT RATIO: 1.9 mg/g (ref 0.0–30.0)
Microalb, Ur: <0.7 mg/dL (ref 0.0–1.9)

## 2017-10-02 ENCOUNTER — Telehealth: Payer: Self-pay

## 2017-10-02 LAB — PAIN MGMT, PROFILE 8 W/CONF, U
6 Acetylmorphine: NEGATIVE ng/mL (ref <10)
ALCOHOL METABOLITES: NEGATIVE ng/mL (ref <500)
Alphahydroxyalprazolam: NEGATIVE ng/mL (ref <25)
Alphahydroxymidazolam: NEGATIVE ng/mL (ref <50)
Alphahydroxytriazolam: NEGATIVE ng/mL (ref <50)
Amphetamines: NEGATIVE ng/mL (ref <500)
BUPRENORPHINE, URINE: NEGATIVE ng/mL (ref <5)
Benzodiazepines: POSITIVE ng/mL — AB (ref <100)
COCAINE METABOLITE: NEGATIVE ng/mL (ref <150)
Codeine: NEGATIVE ng/mL (ref <50)
Creatinine: 117.3 mg/dL
HYDROCODONE: 1047 ng/mL — AB (ref <50)
HYDROXYETHYLFLURAZEPAM: NEGATIVE ng/mL (ref <50)
Hydromorphone: 95 ng/mL — ABNORMAL HIGH (ref <50)
Lorazepam: NEGATIVE ng/mL (ref <50)
MARIJUANA METABOLITE: NEGATIVE ng/mL (ref <20)
MDMA: NEGATIVE ng/mL (ref <500)
MORPHINE: NEGATIVE ng/mL (ref <50)
NORHYDROCODONE: 917 ng/mL — AB (ref <50)
Nordiazepam: 329 ng/mL — ABNORMAL HIGH (ref <50)
OPIATES: POSITIVE ng/mL — AB (ref <100)
Oxazepam: 933 ng/mL — ABNORMAL HIGH (ref <50)
Oxidant: NEGATIVE ug/mL (ref <200)
Oxycodone: NEGATIVE ng/mL (ref <100)
TEMAZEPAM: 733 ng/mL — AB (ref <50)
pH: 4.92 (ref 4.5–9.0)

## 2017-10-02 NOTE — Telephone Encounter (Signed)
Pt aware to Ramelo Oetken/C HCTZ/changed nurse visit on 10.25.18 to B-12 inj & BP check for 30 minutes/corrected med lit/pt also aware to change calcium carbonate to calcium citrate but did not state that she will change that as of yet/thx dmf

## 2017-10-02 NOTE — Telephone Encounter (Signed)
-----   Message from Mosie Lukes, MD sent at 10/01/2017  9:58 PM EDT ----- I reviewed her meds and BP. I think it would be reasonable to stop her hctz and not add another med. Just have her do a BP check in a month.. Also her list says she takes calcium carbonate. If she is she should switch to calcium citrate it causes less constipation.

## 2017-10-16 ENCOUNTER — Other Ambulatory Visit: Payer: Self-pay | Admitting: Family Medicine

## 2017-10-20 ENCOUNTER — Other Ambulatory Visit: Payer: Self-pay | Admitting: Family Medicine

## 2017-10-22 ENCOUNTER — Other Ambulatory Visit: Payer: Self-pay | Admitting: Emergency Medicine

## 2017-10-22 MED ORDER — GABAPENTIN 800 MG PO TABS: 800.0000 mg | ORAL_TABLET | Freq: Three times a day (TID) | ORAL | 1 refills | Status: DC

## 2017-10-24 ENCOUNTER — Ambulatory Visit: Payer: Medicare Other

## 2017-10-24 ENCOUNTER — Ambulatory Visit (INDEPENDENT_AMBULATORY_CARE_PROVIDER_SITE_OTHER): Payer: Medicare Other | Admitting: Family Medicine

## 2017-10-24 VITALS — BP 146/80 | HR 57

## 2017-10-24 DIAGNOSIS — E538 Deficiency of other specified B group vitamins: Secondary | ICD-10-CM | POA: Diagnosis not present

## 2017-10-24 MED ORDER — CYANOCOBALAMIN 1000 MCG/ML IJ SOLN
1000.0000 ug | Freq: Once | INTRAMUSCULAR | Status: AC
  Administered 2017-10-24: 1000 ug via INTRAMUSCULAR

## 2017-10-24 NOTE — Patient Instructions (Signed)
Encouraged increased hydration and fiber in diet. Daily probiotics. If bowels not moving can use MOM 2 tbls po in 4 oz of warm prune juice by mouth every 2-3 days. If no results then repeat in 4 hours with  Dulcolax suppository pr, may repeat again in 4 more hours as needed. Seek care if symptoms worsen. Consider daily Miralax and/or Dulcolax if symptoms persist.  

## 2017-10-24 NOTE — Progress Notes (Signed)
Pre visit review using our clinic tool,if applicable. No additional management support is needed unless otherwise documented below in the visit note.   Patient in for BP check per order dated 10/02/17 from Dr. Frederik Pear. Patients Hctz was discontinued and Calcium carbonate was changed to Calcium citrate.   Patient states she is still having problems with bowels but slightly getting better. States she does hav to strain.  Given B12   BP today = 146/80 P = 57  Per Dr. Charlett Blake patients BP is ok for now will not make changes at this time.  Given patient copy of instructions for constipation from Dr. Charlett Blake.  Patient also received B12 injection today per order from Dr. Charlett Blake due to B12 deficiency.  Given 1000 mcg IM Right deltoid. Patient tolerated well. Patient has appoint scheduled for next B12 injection.  Nurse blood pressure check note reviewed. Agree with documention and plan.

## 2017-11-04 DIAGNOSIS — E119 Type 2 diabetes mellitus without complications: Secondary | ICD-10-CM | POA: Diagnosis not present

## 2017-11-11 ENCOUNTER — Other Ambulatory Visit: Payer: Self-pay | Admitting: Family Medicine

## 2017-11-11 DIAGNOSIS — F419 Anxiety disorder, unspecified: Secondary | ICD-10-CM

## 2017-11-18 NOTE — Telephone Encounter (Signed)
Patient's daughter following up on med refill- Please advise when filled.

## 2017-11-20 NOTE — Telephone Encounter (Signed)
rx called in for patient

## 2017-11-28 ENCOUNTER — Ambulatory Visit (INDEPENDENT_AMBULATORY_CARE_PROVIDER_SITE_OTHER): Payer: Medicare Other

## 2017-11-28 DIAGNOSIS — E538 Deficiency of other specified B group vitamins: Secondary | ICD-10-CM

## 2017-11-28 MED ORDER — CYANOCOBALAMIN 1000 MCG/ML IJ SOLN
1000.0000 ug | Freq: Once | INTRAMUSCULAR | Status: AC
  Administered 2017-11-28: 1000 ug via INTRAMUSCULAR

## 2017-11-28 NOTE — Progress Notes (Signed)
Pre visit review using our clinic tool,if applicable. No additional management support is needed unless otherwise documented below in the visit note.   Patient in for B12 injection per order from Dr. Cherlynn June. Patient has B12 deficiency.  Given 1000 mcg IM right deltoid. Patient tolerated well. States she sees Dr. Charlett Blake next month and will not re-schedule next injection today.  No complaints voiced this visit.

## 2017-12-07 ENCOUNTER — Other Ambulatory Visit: Payer: Self-pay | Admitting: Family Medicine

## 2017-12-10 ENCOUNTER — Other Ambulatory Visit: Payer: Self-pay | Admitting: Family Medicine

## 2017-12-12 ENCOUNTER — Ambulatory Visit: Payer: Medicare Other | Admitting: Family Medicine

## 2018-01-09 ENCOUNTER — Encounter: Payer: Self-pay | Admitting: Family Medicine

## 2018-01-09 ENCOUNTER — Ambulatory Visit (INDEPENDENT_AMBULATORY_CARE_PROVIDER_SITE_OTHER): Payer: Medicare Other | Admitting: Family Medicine

## 2018-01-09 VITALS — BP 132/60 | HR 66 | Temp 98.0°F | Resp 14 | Ht 66.14 in | Wt 142.6 lb

## 2018-01-09 DIAGNOSIS — I10 Essential (primary) hypertension: Secondary | ICD-10-CM | POA: Diagnosis not present

## 2018-01-09 DIAGNOSIS — E1142 Type 2 diabetes mellitus with diabetic polyneuropathy: Secondary | ICD-10-CM

## 2018-01-09 DIAGNOSIS — E538 Deficiency of other specified B group vitamins: Secondary | ICD-10-CM | POA: Diagnosis not present

## 2018-01-09 DIAGNOSIS — N189 Chronic kidney disease, unspecified: Secondary | ICD-10-CM | POA: Diagnosis not present

## 2018-01-09 DIAGNOSIS — E1149 Type 2 diabetes mellitus with other diabetic neurological complication: Secondary | ICD-10-CM | POA: Diagnosis not present

## 2018-01-09 DIAGNOSIS — Z0283 Encounter for blood-alcohol and blood-drug test: Secondary | ICD-10-CM | POA: Diagnosis not present

## 2018-01-09 DIAGNOSIS — E782 Mixed hyperlipidemia: Secondary | ICD-10-CM | POA: Diagnosis not present

## 2018-01-09 DIAGNOSIS — M8580 Other specified disorders of bone density and structure, unspecified site: Secondary | ICD-10-CM | POA: Diagnosis not present

## 2018-01-09 DIAGNOSIS — F419 Anxiety disorder, unspecified: Secondary | ICD-10-CM | POA: Diagnosis not present

## 2018-01-09 DIAGNOSIS — Z79899 Other long term (current) drug therapy: Secondary | ICD-10-CM | POA: Diagnosis not present

## 2018-01-09 DIAGNOSIS — G894 Chronic pain syndrome: Secondary | ICD-10-CM | POA: Diagnosis not present

## 2018-01-09 MED ORDER — CYANOCOBALAMIN 1000 MCG/ML IJ SOLN
1000.0000 ug | Freq: Once | INTRAMUSCULAR | Status: AC
  Administered 2018-01-09: 1000 ug via INTRAMUSCULAR

## 2018-01-09 MED ORDER — DIAZEPAM 5 MG PO TABS: 2.5000 mg | ORAL_TABLET | Freq: Every day | ORAL | 1 refills | Status: DC | PRN

## 2018-01-09 MED ORDER — TRAMADOL HCL 50 MG PO TABS: 50.0000 mg | ORAL_TABLET | Freq: Three times a day (TID) | ORAL | 0 refills | Status: DC | PRN

## 2018-01-09 NOTE — Assessment & Plan Note (Signed)
Encouraged to get adequate exercise, calcium and vitamin d intake 

## 2018-01-09 NOTE — Assessment & Plan Note (Signed)
Well controlled, no changes to meds. Encouraged heart healthy diet such as the DASH diet and exercise as tolerated.  °

## 2018-01-09 NOTE — Patient Instructions (Signed)

## 2018-01-09 NOTE — Assessment & Plan Note (Signed)
Given shot today 

## 2018-01-09 NOTE — Assessment & Plan Note (Signed)
Mild, will monitor 

## 2018-01-09 NOTE — Progress Notes (Signed)
Subjective:  I acted as a Education administrator for [employer]. Yancey Flemings, [place]   Patient ID: [name], female    DOB: [birth year], 84 y.o.   MRN: [id number]  Chief Complaint  Patient presents with  . Follow-up    HPI  Patient is in today for follow up visit. No recent febrile illness or hospitalizations. Is in her usual state of health but only notes some stable cold intolerance. Denies CP/palp/SOB/HA/congestion/fevers/GI or GU c/o. Taking meds as prescribed  Patient Care Team: Mosie Lukes, MD as PCP - General (Family Medicine) Marica Otter, [place] as Consulting Physician (Optometry) Garlan Fair, MD as Consulting Physician (Gastroenterology)   Past Medical History:  Diagnosis Date  . Allergy    sneeze, rhinorrhea in am  . Chicken pox as a child  . Chronic pain syndrome 01/23/2015  . Colon cancer ([place])   . Complication of anesthesia    agitated when waking up, wakes up shaking, "like  I'm going into shock"  . Depression    husband died  3 months ago, pt. admits depression  currently   . Diabetes mellitus    6 years  . DM (diabetes mellitus) type 2, uncontrolled, with ketoacidosis ([place]) 03/17/2011  . Fibromyalgia   . GERD (gastroesophageal reflux disease)   . H/O echocardiogram    done /w [place] in 2011, saw Dr. Percival Spanish for tachycardia   . Headache(784.0)    occasional - "bad headaches"  . Hyperlipidemia    6 years  . Hypertension    15 years, reports she has a rapid heartrate   . Measles as a child  . Osteoarthritis    in hands & all over, feet  . Pain in finger of right hand 05/15/2017  . Pain of right shoulder region 07/07/2014  . Peripheral neuropathy   . Shortness of breath   . Type 2 diabetes mellitus with peripheral neuropathy ([place]) 03/17/2011    Past Surgical History:  Procedure Laterality Date  . BREAST EXCISIONAL BIOPSY Right 2013   benign  . BREAST SURGERY  2013   right- benign  . CATARACT EXTRACTION     /w IOL- bilateral   . COLON SURGERY     For  resection of cancer  . Nodule removed     From throat    Family History  Problem Relation Age of Onset  . Cancer Mother        Brain  . Cancer Father        Colorectal  . Cancer Sister        breast  . Other Brother        pacemaker  . Cancer Brother        pancreatic cancer  . Arthritis Sister   . Emphysema Brother   . Cancer Brother   . COPD Brother   . Neuropathy Daughter   . Fibromyalgia Daughter   . Heart disease Neg Hx        Early    Social History   Socioeconomic History  . Marital status: Widowed    Spouse name: Not on file  . Number of children: 1  . Years of education: 55  . Highest education level: Not on file  Social Needs  . Financial resource strain: Not on file  . Food insecurity - worry: Not on file  . Food insecurity - inability: Not on file  . Transportation needs - medical: Not on file  . Transportation needs - non-medical: Not on file  Occupational  History  . Occupation: Retired  Tobacco Use  . Smoking status: Never Smoker  . Smokeless tobacco: Never Used  Substance and Sexual Activity  . Alcohol use: No  . Drug use: No  . Sexual activity: No    Comment: lives with daughter. no dietary restrictions  Other Topics Concern  . Not on file  Social History Narrative   Patient lives at home with daughter.    Patient is retired.    Patient is widowed.    Patient has 1 child.    Patient has a 9th grade education.     Outpatient Medications Prior to Visit  Medication Sig Dispense Refill  . calcium carbonate (OS-CAL) 600 MG TABS Take 600 mg by mouth 2 (two) times daily with a meal.      . Cholecalciferol (VITAMIN D3) 5000 UNITS CAPS Take 5,000 Units by mouth daily.    . citalopram (CELEXA) 10 MG tablet TAKE 1 TABLET BY MOUTH EVERY DAY 90 tablet 1  . diazepam (VALIUM) 5 MG tablet TAKE 1 TABLET BY MOUTH EVERY DAY AS NEEDED 30 tablet 1  . gabapentin (NEURONTIN) 800 MG tablet TAKE 1 TABLET BY MOUTH THREE TIMES A DAY 90 tablet 1  . metFORMIN  (GLUCOPHAGE) 500 MG tablet Take 1 tablet (500 mg total) by mouth daily with breakfast. 90 tablet 3  . pantoprazole (PROTONIX) 40 MG tablet TAKE 1 TABLET (40 MG TOTAL) BY MOUTH DAILY. 90 tablet 1  . pantoprazole (PROTONIX) 40 MG tablet TAKE 1 TABLET (40 MG TOTAL) BY MOUTH DAILY. 30 tablet 3  . pravastatin (PRAVACHOL) 40 MG tablet TAKE 1 TABLET BY MOUTH AT BEDTIME 90 tablet 1  . lactulose (CHRONULAC) 10 GM/15ML solution Take 15 mLs (10 g total) by mouth daily. (Patient not taking: Reported on 01/09/2018) 240 mL 2  . ranitidine (ZANTAC) 150 MG tablet Take 1 tablet (150 mg total) by mouth at bedtime as needed for heartburn. (Patient not taking: Reported on 01/09/2018) 30 tablet 2   Facility-Administered Medications Prior to Visit  Medication Dose Route Frequency Provider Last Rate Last Dose  . cyanocobalamin ((VITAMIN B-12)) injection 1,000 mcg  1,000 mcg Intramuscular Once Mosie Lukes, MD        Allergies  Allergen Reactions  . Tape Itching and Rash  . Actos [Pioglitazone Hydrochloride] Other (See Comments)    Unknown  . Buspar [Buspirone Hcl] Other (See Comments)    Unknown  . Lipitor [Atorvastatin Calcium] Other (See Comments)    "It made me hurt all over."  . Penicillins Rash    Review of Systems  Constitutional: Negative for fever and malaise/fatigue.  HENT: Negative for congestion.   Eyes: Negative for blurred vision.  Respiratory: Negative for shortness of breath.   Cardiovascular: Negative for chest pain, palpitations and leg swelling.  Gastrointestinal: Negative for abdominal pain, blood in stool and nausea.  Genitourinary: Negative for dysuria and frequency.  Musculoskeletal: Negative for falls.  Skin: Negative for rash.  Neurological: Negative for dizziness, loss of consciousness and headaches.  Endo/Heme/Allergies: Negative for environmental allergies.  Psychiatric/Behavioral: Negative for depression. The patient is not nervous/anxious.        Objective:      Physical Exam  Constitutional: She is oriented to person, place, and time. She appears well-developed and well-nourished. No distress.  HENT:  Head: Normocephalic and atraumatic.  Nose: Nose normal.  Eyes: Right eye exhibits no discharge. Left eye exhibits no discharge.  Neck: Normal range of motion. Neck supple.  Cardiovascular: Normal rate  and regular rhythm.  No murmur heard. Pulmonary/Chest: Effort normal and breath sounds normal.  Abdominal: Soft. Bowel sounds are normal. There is no tenderness.  Musculoskeletal: She exhibits no edema.  Neurological: She is alert and oriented to person, place, and time.  Skin: Skin is warm and dry.  Psychiatric: She has a normal mood and affect.  Nursing note and vitals reviewed.   BP (!) 160/76 (BP Location: Left Arm, Patient Position: Sitting, Cuff Size: Normal)   Pulse 66   Temp 98 F (36.7 C) (Oral)   Resp 14   Ht 5' 6.14" (1.68 m)   Wt 142 lb 9.6 oz (64.7 kg)   SpO2 98%   BMI 22.92 kg/m  Wt Readings from Last 3 Encounters:  01/09/18 142 lb 9.6 oz (64.7 kg)  09/24/17 142 lb (64.4 kg)  05/14/17 143 lb 9.6 oz (65.1 kg)   BP Readings from Last 3 Encounters:  01/09/18 (!) 160/76  10/24/17 (!) 146/80  09/24/17 116/63     Immunization History  Administered Date(s) Administered  . Influenza Whole 09/30/2010  . Influenza, High Dose Seasonal PF 09/24/2017  . Influenza,inj,Quad PF,6+ Mos 08/24/2014, 11/13/2016  . Influenza-Unspecified 01/04/2014, 10/13/2015  . Pneumococcal Conjugate-13 09/14/2014  . Pneumococcal Polysaccharide-23 10/27/2015  . Td 01/01/2004  . Tdap 10/17/2011    Health Maintenance  Topic Date Due  . FOOT EXAM  02/11/2018  . HEMOGLOBIN A1C  03/24/2018  . OPHTHALMOLOGY EXAM  04/03/2018  . URINE MICROALBUMIN  09/30/2018  . TETANUS/TDAP  10/16/2021  . INFLUENZA VACCINE  Completed  . DEXA SCAN  Completed  . PNA vac Low Risk Adult  Completed    Lab Results  Component Value Date   WBC 6.3 09/24/2017   HGB  12.5 09/24/2017   HCT 39.3 09/24/2017   PLT 246.0 09/24/2017   GLUCOSE 93 09/24/2017   CHOL 173 09/24/2017   TRIG 162.0 (H) 09/24/2017   HDL 44.40 09/24/2017   LDLCALC 97 09/24/2017   ALT 9 09/24/2017   AST 17 09/24/2017   NA 139 09/24/2017   K 4.7 09/24/2017   CL 102 09/24/2017   CREATININE 1.32 (H) 09/24/2017   BUN 24 (H) 09/24/2017   CO2 30 09/24/2017   TSH 3.61 09/24/2017   HGBA1C 6.7 (H) 09/24/2017   MICROALBUR <0.7 09/30/2017    Lab Results  Component Value Date   TSH 3.61 09/24/2017   Lab Results  Component Value Date   WBC 6.3 09/24/2017   HGB 12.5 09/24/2017   HCT 39.3 09/24/2017   MCV 85.5 09/24/2017   PLT 246.0 09/24/2017   Lab Results  Component Value Date   NA 139 09/24/2017   K 4.7 09/24/2017   CO2 30 09/24/2017   GLUCOSE 93 09/24/2017   BUN 24 (H) 09/24/2017   CREATININE 1.32 (H) 09/24/2017   BILITOT 0.4 09/24/2017   ALKPHOS 63 09/24/2017   AST 17 09/24/2017   ALT 9 09/24/2017   PROT 7.2 09/24/2017   ALBUMIN 4.2 09/24/2017   CALCIUM 9.7 09/24/2017   GFR 40.23 (L) 09/24/2017   Lab Results  Component Value Date   CHOL 173 09/24/2017   Lab Results  Component Value Date   HDL 44.40 09/24/2017   Lab Results  Component Value Date   LDLCALC 97 09/24/2017   Lab Results  Component Value Date   TRIG 162.0 (H) 09/24/2017   Lab Results  Component Value Date   CHOLHDL 4 09/24/2017   Lab Results  Component Value Date   HGBA1C  6.7 (H) 09/24/2017         Assessment & Plan:   Problem List Items Addressed This Visit    Essential hypertension, benign    Well controlled, no changes to meds. Encouraged heart healthy diet such as the DASH diet and exercise as tolerated.       Type 2 diabetes mellitus with peripheral neuropathy (HCC)    hgba1c acceptable, minimize simple carbs. Increase exercise as tolerated. Continue current meds      Diabetic neuropathy (HCC)    hgba1c acceptable, minimize simple carbs. Increase exercise as  tolerated. Continue current meds      Chronic renal insufficiency    Mild, will monitor      Hyperlipidemia, mixed    Encouraged heart healthy diet, increase exercise, avoid trans fats, consider a krill oil cap daily, did not tolerate Pravastatin, had constipation, it is better since stopping      Osteopenia    Encouraged to get adequate exercise, calcium and vitamin d intake      Vitamin B 12 deficiency    Given shot today       Other Visit Diagnoses    Anxiety          I have discontinued Lashe M. Kintzel's pravastatin. I am also having her maintain her calcium carbonate, Vitamin D3, ranitidine, pantoprazole, lactulose, citalopram, diazepam, metFORMIN, and gabapentin. We will continue to administer cyanocobalamin.  No orders of the defined types were placed in this encounter.   CMA served as Education administrator during this visit. History, Physical and Plan performed by medical provider. Documentation and orders reviewed and attested to.  Penni Homans, MD

## 2018-01-09 NOTE — Assessment & Plan Note (Signed)
Encouraged heart healthy diet, increase exercise, avoid trans fats, consider a krill oil cap daily, did not tolerate Pravastatin, had constipation, it is better since stopping

## 2018-01-09 NOTE — Assessment & Plan Note (Signed)
hgba1c acceptable, minimize simple carbs. Increase exercise as tolerated. Continue current meds 

## 2018-01-10 LAB — COMPREHENSIVE METABOLIC PANEL
ALBUMIN: 4.5 g/dL (ref 3.5–5.2)
ALT: 8 U/L (ref 0–35)
AST: 16 U/L (ref 0–37)
Alkaline Phosphatase: 68 U/L (ref 39–117)
BUN: 26 mg/dL — AB (ref 6–23)
CALCIUM: 10 mg/dL (ref 8.4–10.5)
CHLORIDE: 101 meq/L (ref 96–112)
CO2: 30 mEq/L (ref 19–32)
Creatinine, Ser: 1.26 mg/dL — ABNORMAL HIGH (ref 0.40–1.20)
GFR: 42.42 mL/min — ABNORMAL LOW (ref >60.00)
Glucose, Bld: 103 mg/dL — ABNORMAL HIGH (ref 70–99)
POTASSIUM: 5 meq/L (ref 3.5–5.1)
SODIUM: 140 meq/L (ref 135–145)
Total Bilirubin: 0.4 mg/dL (ref 0.2–1.2)
Total Protein: 7.6 g/dL (ref 6.0–8.3)

## 2018-01-10 LAB — TSH: TSH: 2.7 u[IU]/mL (ref 0.35–4.50)

## 2018-01-10 LAB — LIPID PANEL
CHOLESTEROL: 175 mg/dL (ref 0–200)
HDL: 49.8 mg/dL (ref >39.00)
LDL CALC: 99 mg/dL (ref 0–99)
NonHDL: 125.48
TRIGLYCERIDES: 130 mg/dL (ref 0.0–149.0)
Total CHOL/HDL Ratio: 4
VLDL: 26 mg/dL (ref 0.0–40.0)

## 2018-01-10 LAB — VITAMIN B12: Vitamin B-12: >1500 pg/mL — ABNORMAL HIGH (ref 211–911)

## 2018-01-10 LAB — CBC
HCT: 41.4 % (ref 36.0–46.0)
Hemoglobin: 13.2 g/dL (ref 12.0–15.0)
MCHC: 31.9 g/dL (ref 30.0–36.0)
MCV: 84.9 fl (ref 78.0–100.0)
Platelets: 279 10*3/uL (ref 150.0–400.0)
RBC: 4.88 Mil/uL (ref 3.87–5.11)
RDW: 16 % — ABNORMAL HIGH (ref 11.5–15.5)
WBC: 6.6 10*3/uL (ref 4.0–10.5)

## 2018-01-10 LAB — VITAMIN D 25 HYDROXY (VIT D DEFICIENCY, FRACTURES): VITD: 66.46 ng/mL (ref 30.00–100.00)

## 2018-01-10 LAB — HEMOGLOBIN A1C: Hgb A1c MFr Bld: 6.7 % — ABNORMAL HIGH (ref 4.6–6.5)

## 2018-01-16 LAB — INTRINSIC FACTOR ANTIBODIES: INTRINSIC FACTOR: POSITIVE — AB

## 2018-01-16 LAB — PAIN MGMT, PROFILE 8 W/CONF, U
6 Acetylmorphine: NEGATIVE ng/mL (ref <10)
ALPHAHYDROXYALPRAZOLAM: NEGATIVE ng/mL (ref <25)
ALPHAHYDROXYTRIAZOLAM: NEGATIVE ng/mL (ref <50)
Alcohol Metabolites: NEGATIVE ng/mL (ref <500)
Alphahydroxymidazolam: NEGATIVE ng/mL (ref <50)
Aminoclonazepam: NEGATIVE ng/mL (ref <25)
Amphetamines: NEGATIVE ng/mL (ref <500)
BENZODIAZEPINES: POSITIVE ng/mL — AB (ref <100)
BUPRENORPHINE, URINE: NEGATIVE ng/mL (ref <5)
Cocaine Metabolite: NEGATIVE ng/mL (ref <150)
Codeine: NEGATIVE ng/mL (ref <50)
Creatinine: 68.2 mg/dL
HYDROCODONE: 54 ng/mL — AB (ref <50)
HYDROMORPHONE: NEGATIVE ng/mL (ref <50)
HYDROXYETHYLFLURAZEPAM: NEGATIVE ng/mL (ref <50)
Lorazepam: NEGATIVE ng/mL (ref <50)
MARIJUANA METABOLITE: NEGATIVE ng/mL (ref <20)
MDMA: NEGATIVE ng/mL (ref <500)
Morphine: NEGATIVE ng/mL (ref <50)
Nordiazepam: 427 ng/mL — ABNORMAL HIGH (ref <50)
Norhydrocodone: 131 ng/mL — ABNORMAL HIGH (ref <50)
Opiates: POSITIVE ng/mL — AB (ref <100)
Oxidant: NEGATIVE ug/mL (ref <200)
Oxycodone: NEGATIVE ng/mL (ref <100)
Temazepam: >1000 ng/mL — ABNORMAL HIGH (ref <50)
pH: 5.39 (ref 4.5–9.0)

## 2018-02-11 ENCOUNTER — Ambulatory Visit: Payer: Medicare Other

## 2018-02-13 ENCOUNTER — Ambulatory Visit (INDEPENDENT_AMBULATORY_CARE_PROVIDER_SITE_OTHER): Payer: Medicare Other

## 2018-02-13 DIAGNOSIS — E538 Deficiency of other specified B group vitamins: Secondary | ICD-10-CM | POA: Diagnosis not present

## 2018-02-14 MED ORDER — CYANOCOBALAMIN 1000 MCG/ML IJ SOLN
1000.0000 ug | Freq: Once | INTRAMUSCULAR | Status: AC
  Administered 2018-02-13: 1000 ug via INTRAMUSCULAR

## 2018-02-14 NOTE — Progress Notes (Signed)
Pt came in at 1530 02/13/2018 for B12 injection. Received at 1540 in Right arm. Pt advised to follow up as advised by PCP, Pt stated she'd comply.

## 2018-02-20 DIAGNOSIS — E119 Type 2 diabetes mellitus without complications: Secondary | ICD-10-CM | POA: Diagnosis not present

## 2018-02-21 ENCOUNTER — Telehealth: Payer: Self-pay | Admitting: *Deleted

## 2018-02-21 NOTE — Telephone Encounter (Signed)
Received Physician Orders from [facility]; forwarded to provider/SLS 02/22

## 2018-02-28 ENCOUNTER — Other Ambulatory Visit: Payer: Self-pay | Admitting: Family Medicine

## 2018-03-03 ENCOUNTER — Other Ambulatory Visit: Payer: Self-pay | Admitting: Family Medicine

## 2018-03-11 ENCOUNTER — Ambulatory Visit (INDEPENDENT_AMBULATORY_CARE_PROVIDER_SITE_OTHER): Payer: Medicare Other | Admitting: *Deleted

## 2018-03-11 DIAGNOSIS — E538 Deficiency of other specified B group vitamins: Secondary | ICD-10-CM

## 2018-03-11 MED ORDER — CYANOCOBALAMIN 1000 MCG/ML IJ SOLN
1000.0000 ug | Freq: Once | INTRAMUSCULAR | Status: AC
  Administered 2018-03-11: 1000 ug via INTRAMUSCULAR

## 2018-03-11 NOTE — Progress Notes (Signed)
Pre visit review using our clinic review tool, if applicable. No additional management support is needed unless otherwise documented below in the visit note.  Pt here for monthly b12 injection per order of Dr Charlett Blake.  B12 1027mcg given IM left deltoid and pt tolerated injection well.  Pt will get next B12 injection at follow up with Dr Charlett Blake on 04/10/18.

## 2018-04-02 ENCOUNTER — Other Ambulatory Visit: Payer: Self-pay | Admitting: Family Medicine

## 2018-04-03 ENCOUNTER — Other Ambulatory Visit: Payer: Self-pay | Admitting: Family Medicine

## 2018-04-10 ENCOUNTER — Encounter: Payer: Self-pay | Admitting: Family Medicine

## 2018-04-10 ENCOUNTER — Ambulatory Visit (INDEPENDENT_AMBULATORY_CARE_PROVIDER_SITE_OTHER): Payer: Medicare Other | Admitting: Family Medicine

## 2018-04-10 VITALS — BP 124/66 | HR 65 | Temp 97.8°F | Resp 18 | Wt 149.0 lb

## 2018-04-10 DIAGNOSIS — E1142 Type 2 diabetes mellitus with diabetic polyneuropathy: Secondary | ICD-10-CM

## 2018-04-10 DIAGNOSIS — E782 Mixed hyperlipidemia: Secondary | ICD-10-CM

## 2018-04-10 DIAGNOSIS — G894 Chronic pain syndrome: Secondary | ICD-10-CM | POA: Diagnosis not present

## 2018-04-10 DIAGNOSIS — E538 Deficiency of other specified B group vitamins: Secondary | ICD-10-CM | POA: Diagnosis not present

## 2018-04-10 DIAGNOSIS — K5900 Constipation, unspecified: Secondary | ICD-10-CM | POA: Diagnosis not present

## 2018-04-10 DIAGNOSIS — N189 Chronic kidney disease, unspecified: Secondary | ICD-10-CM

## 2018-04-10 DIAGNOSIS — Z79899 Other long term (current) drug therapy: Secondary | ICD-10-CM

## 2018-04-10 DIAGNOSIS — I10 Essential (primary) hypertension: Secondary | ICD-10-CM | POA: Diagnosis not present

## 2018-04-10 MED ORDER — TRAMADOL HCL 50 MG PO TABS: 50.0000 mg | ORAL_TABLET | Freq: Three times a day (TID) | ORAL | 1 refills | Status: DC | PRN

## 2018-04-10 MED ORDER — TRAMADOL HCL 50 MG PO TABS: 50.0000 mg | ORAL_TABLET | Freq: Three times a day (TID) | ORAL | 0 refills | Status: DC | PRN

## 2018-04-10 MED ORDER — PRAVASTATIN SODIUM 40 MG PO TABS: 40.0000 mg | ORAL_TABLET | Freq: Every day | ORAL | 1 refills | Status: DC

## 2018-04-10 NOTE — Assessment & Plan Note (Signed)
Discussed diet and lifestyle concerns with patient. Offered nutrition consult for further management.  

## 2018-04-10 NOTE — Patient Instructions (Addendum)
Miralax and Benefiber once or twice daily Tylenol/Acetaminophen ES 500 mg tab daily  Encouraged increased hydration and fiber in diet. Daily probiotics. If bowels not moving can use MOM 2 tbls po in 4 oz of warm prune juice by mouth every 2-3 days. If no results then repeat in 4 hours with  Dulcolax suppository pr, may repeat again in 4 more hours as needed. Seek care if symptoms worsen. Consider daily Miralax and/or Dulcolax if symptoms persist.  Carbohydrate Counting for Diabetes Mellitus, Adult Carbohydrate counting is a method for keeping track of how many carbohydrates you eat. Eating carbohydrates naturally increases the amount of sugar (glucose) in the blood. Counting how many carbohydrates you eat helps keep your blood glucose within normal limits, which helps you manage your diabetes (diabetes mellitus). It is important to know how many carbohydrates you can safely have in each meal. This is different for every person. A diet and nutrition specialist (registered dietitian) can help you make a meal plan and calculate how many carbohydrates you should have at each meal and snack. Carbohydrates are found in the following foods:  Grains, such as breads and cereals.  Dried beans and soy products.  Starchy vegetables, such as potatoes, peas, and corn.  Fruit and fruit juices.  Milk and yogurt.  Sweets and snack foods, such as cake, cookies, candy, chips, and soft drinks.  How do I count carbohydrates? There are two ways to count carbohydrates in food. You can use either of the methods or a combination of both. Reading "Nutrition Facts" on packaged food The "Nutrition Facts" list is included on the labels of almost all packaged foods and beverages in the [place] It includes:  The serving size.  Information about nutrients in each serving, including the grams (g) of carbohydrate per serving.  To use the "Nutrition Facts":  Decide how many servings you will have.  Multiply the number of  servings by the number of carbohydrates per serving.  The resulting number is the total amount of carbohydrates that you will be having.  Learning standard serving sizes of other foods When you eat foods containing carbohydrates that are not packaged or do not include "Nutrition Facts" on the label, you need to measure the servings in order to count the amount of carbohydrates:  Measure the foods that you will eat with a food scale or measuring cup, if needed.  Decide how many standard-size servings you will eat.  Multiply the number of servings by 15. Most carbohydrate-rich foods have about 15 g of carbohydrates per serving. ? For example, if you eat 8 oz (170 g) of strawberries, you will have eaten 2 servings and 30 g of carbohydrates (2 servings x 15 g = 30 g).  For foods that have more than one food mixed, such as soups and casseroles, you must count the carbohydrates in each food that is included.  The following list contains standard serving sizes of common carbohydrate-rich foods. Each of these servings has about 15 g of carbohydrates:   hamburger bun or  English muffin.   oz (15 mL) syrup.   oz (14 g) jelly.  1 slice of bread.  1 six-inch tortilla.  3 oz (85 g) cooked rice or pasta.  4 oz (113 g) cooked dried beans.  4 oz (113 g) starchy vegetable, such as peas, corn, or potatoes.  4 oz (113 g) hot cereal.  4 oz (113 g) mashed potatoes or  of a large baked potato.  4 oz (113  g) canned or frozen fruit.  4 oz (120 mL) fruit juice.  4-6 crackers.  6 chicken nuggets.  6 oz (170 g) unsweetened dry cereal.  6 oz (170 g) plain fat-free yogurt or yogurt sweetened with artificial sweeteners.  8 oz (240 mL) milk.  8 oz (170 g) fresh fruit or one small piece of fruit.  24 oz (680 g) popped popcorn.  Example of carbohydrate counting Sample meal  3 oz (85 g) chicken breast.  6 oz (170 g) brown rice.  4 oz (113 g) corn.  8 oz (240 mL) milk.  8 oz (170  g) strawberries with sugar-free whipped topping. Carbohydrate calculation 1. Identify the foods that contain carbohydrates: ? Rice. ? Corn. ? Milk. ? Strawberries. 2. Calculate how many servings you have of each food: ? 2 servings rice. ? 1 serving corn. ? 1 serving milk. ? 1 serving strawberries. 3. Multiply each number of servings by 15 g: ? 2 servings rice x 15 g = 30 g. ? 1 serving corn x 15 g = 15 g. ? 1 serving milk x 15 g = 15 g. ? 1 serving strawberries x 15 g = 15 g. 4. Add together all of the amounts to find the total grams of carbohydrates eaten: ? 30 g + 15 g + 15 g + 15 g = 75 g of carbohydrates total. This information is not intended to replace advice given to you by your health care provider. Make sure you discuss any questions you have with your health care provider. Document Released: 12/17/2005 Document Revised: 07/06/2016 Document Reviewed: 05/30/2016 Elsevier Interactive Patient Education  [employer].

## 2018-04-10 NOTE — Assessment & Plan Note (Signed)
Allowed refill for Tramadol which helps some. Try adding a Tylenol daily

## 2018-04-10 NOTE — Progress Notes (Signed)
Subjective:  I acted as a Education administrator for Dr. Charlett Blake. Dana Burns, [place]  Patient ID: Dana Burns, female    DOB: [birth year], 84 y.o.   MRN: [id number]  No chief complaint on file.   HPI  Patient is in today for a 3 month follow up and she continues to struggle with daily neck and back pain. Stays fairly active as tolerated and uses Tramadol prn with adequate relief. No recent febrile illness or hospitalizations. No polyuria or polydipsia. FSG this am was 109 which is a typical number. Denies CP/palp/SOB/HA/congestion/fevers/GI or GU c/o. Taking meds as prescribed  Patient Care Team: Mosie Lukes, MD as PCP - General (Family Medicine) Marica Otter, [place] as Consulting Physician (Optometry) Garlan Fair, MD as Consulting Physician (Gastroenterology)   Past Medical History:  Diagnosis Date  . Allergy    sneeze, rhinorrhea in am  . Chicken pox as a child  . Chronic pain syndrome 01/23/2015  . Colon cancer ([place])   . Complication of anesthesia    agitated when waking up, wakes up shaking, "like  I'm going into shock"  . Depression    husband died  3 months ago, pt. admits depression  currently   . Diabetes mellitus    6 years  . DM (diabetes mellitus) type 2, uncontrolled, with ketoacidosis ([place]) 03/17/2011  . Fibromyalgia   . GERD (gastroesophageal reflux disease)   . H/O echocardiogram    done /w [place] in 2011, saw Dr. Percival Spanish for tachycardia   . Headache(784.0)    occasional - "bad headaches"  . Hyperlipidemia    6 years  . Hypertension    15 years, reports she has a rapid heartrate   . Measles as a child  . Osteoarthritis    in hands & all over, feet  . Pain in finger of right hand 05/15/2017  . Pain of right shoulder region 07/07/2014  . Peripheral neuropathy   . Shortness of breath   . Type 2 diabetes mellitus with peripheral neuropathy ([place]) 03/17/2011    Past Surgical History:  Procedure Laterality Date  . BREAST EXCISIONAL BIOPSY Right 2013   benign  . BREAST  SURGERY  2013   right- benign  . CATARACT EXTRACTION     /w IOL- bilateral   . COLON SURGERY     For resection of cancer  . Nodule removed     From throat    Family History  Problem Relation Age of Onset  . Cancer Mother        Brain  . Cancer Father        Colorectal  . Cancer Sister        breast  . Other Brother        pacemaker  . Cancer Brother        pancreatic cancer  . Arthritis Sister   . Emphysema Brother   . Cancer Brother   . COPD Brother   . Neuropathy Daughter   . Fibromyalgia Daughter   . Heart disease Neg Hx        Early    Social History   Socioeconomic History  . Marital status: Widowed    Spouse name: Not on file  . Number of children: 1  . Years of education: 75  . Highest education level: Not on file  Occupational History  . Occupation: Retired  Scientific laboratory technician  . Financial resource strain: Not on file  . Food insecurity:    Worry: Not on file  Inability: Not on file  . Transportation needs:    Medical: Not on file    Non-medical: Not on file  Tobacco Use  . Smoking status: Never Smoker  . Smokeless tobacco: Never Used  Substance and Sexual Activity  . Alcohol use: No  . Drug use: No  . Sexual activity: Never    Comment: lives with daughter. no dietary restrictions  Lifestyle  . Physical activity:    Days per week: Not on file    Minutes per session: Not on file  . Stress: Not on file  Relationships  . Social connections:    Talks on phone: Not on file    Gets together: Not on file    Attends religious service: Not on file    Active member of club or organization: Not on file    Attends meetings of clubs or organizations: Not on file    Relationship status: Not on file  . Intimate partner violence:    Fear of current or ex partner: Not on file    Emotionally abused: Not on file    Physically abused: Not on file    Forced sexual activity: Not on file  Other Topics Concern  . Not on file  Social History Narrative    Patient lives at home with daughter.    Patient is retired.    Patient is widowed.    Patient has 1 child.    Patient has a 9th grade education.     Outpatient Medications Prior to Visit  Medication Sig Dispense Refill  . calcium carbonate (OS-CAL) 600 MG TABS Take 600 mg by mouth 2 (two) times daily with a meal.      . Cholecalciferol (VITAMIN D3) 5000 UNITS CAPS Take 5,000 Units by mouth daily.    . citalopram (CELEXA) 10 MG tablet TAKE 1 TABLET BY MOUTH EVERY DAY 90 tablet 1  . diazepam (VALIUM) 5 MG tablet Take 0.5-1 tablets (2.5-5 mg total) by mouth daily as needed for anxiety. TAKE 1 TABLET BY MOUTH EVERY DAY AS NEEDED 30 tablet 1  . gabapentin (NEURONTIN) 800 MG tablet TAKE 1 TABLET BY MOUTH THREE TIMES A DAY 90 tablet 1  . metFORMIN (GLUCOPHAGE) 500 MG tablet Take 1 tablet (500 mg total) by mouth daily with breakfast. 90 tablet 3  . pantoprazole (PROTONIX) 40 MG tablet TAKE 1 TABLET (40 MG TOTAL) BY MOUTH DAILY. 90 tablet 1  . lactulose (CHRONULAC) 10 GM/15ML solution Take 15 mLs (10 g total) by mouth daily. (Patient not taking: Reported on 01/09/2018) 240 mL 2  . pravastatin (PRAVACHOL) 40 MG tablet TAKE 1 TABLET BY MOUTH AT BEDTIME 90 tablet 0  . ranitidine (ZANTAC) 150 MG tablet Take 1 tablet (150 mg total) by mouth at bedtime as needed for heartburn. (Patient not taking: Reported on 01/09/2018) 30 tablet 2  . traMADol (ULTRAM) 50 MG tablet Take 1 tablet (50 mg total) by mouth every 8 (eight) hours as needed. 30 tablet 0   No facility-administered medications prior to visit.     Allergies  Allergen Reactions  . Tape Itching and Rash  . Actos [Pioglitazone Hydrochloride] Other (See Comments)    Unknown  . Buspar [Buspirone Hcl] Other (See Comments)    Unknown  . Lipitor [Atorvastatin Calcium] Other (See Comments)    "It made me hurt all over."  . Penicillins Rash    Review of Systems  Constitutional: Negative for fever and malaise/fatigue.  HENT: Negative for congestion.    Eyes:  Negative for blurred vision.  Respiratory: Negative for shortness of breath.   Cardiovascular: Negative for chest pain, palpitations and leg swelling.  Gastrointestinal: Negative for abdominal pain, blood in stool and nausea.  Genitourinary: Negative for dysuria and frequency.  Musculoskeletal: Positive for back pain, joint pain, myalgias and neck pain. Negative for falls.  Skin: Negative for rash.  Neurological: Negative for dizziness, loss of consciousness and headaches.  Endo/Heme/Allergies: Negative for environmental allergies.  Psychiatric/Behavioral: Negative for depression. The patient is not nervous/anxious.        Objective:    Physical Exam  Constitutional: She is oriented to person, place, and time. No distress.  HENT:  Head: Normocephalic and atraumatic.  Eyes: Conjunctivae are normal.  Neck: Neck supple. No thyromegaly present.  Cardiovascular: Normal rate, regular rhythm and normal heart sounds.  Pulmonary/Chest: Effort normal and breath sounds normal. She has no wheezes.  Abdominal: She exhibits no distension and no mass.  Musculoskeletal: She exhibits no edema.  Lymphadenopathy:    She has no cervical adenopathy.  Neurological: She is alert and oriented to person, place, and time.  Skin: Skin is warm and dry. No rash noted. She is not diaphoretic.  Psychiatric: Judgment normal.    BP 124/66 (BP Location: Left Arm, Patient Position: Sitting, Cuff Size: Normal)   Pulse 65   Temp 97.8 F (36.6 C) (Oral)   Resp 18   Wt 149 lb (67.6 kg)   SpO2 96%   BMI 23.95 kg/m  Wt Readings from Last 3 Encounters:  04/10/18 149 lb (67.6 kg)  01/09/18 142 lb 9.6 oz (64.7 kg)  09/24/17 142 lb (64.4 kg)   BP Readings from Last 3 Encounters:  04/10/18 124/66  01/09/18 132/60  10/24/17 (!) 146/80     Immunization History  Administered Date(s) Administered  . Influenza Whole 09/30/2010  . Influenza, High Dose Seasonal PF 09/24/2017  . Influenza,inj,Quad PF,6+  Mos 08/24/2014, 11/13/2016  . Influenza-Unspecified 01/04/2014, 10/13/2015  . Pneumococcal Conjugate-13 09/14/2014  . Pneumococcal Polysaccharide-23 10/27/2015  . Td 01/01/2004  . Tdap 10/17/2011    Health Maintenance  Topic Date Due  . FOOT EXAM  02/11/2018  . OPHTHALMOLOGY EXAM  04/03/2018  . HEMOGLOBIN A1C  07/09/2018  . INFLUENZA VACCINE  07/31/2018  . URINE MICROALBUMIN  09/30/2018  . TETANUS/TDAP  10/16/2021  . DEXA SCAN  Completed  . PNA vac Low Risk Adult  Completed    Lab Results  Component Value Date   WBC 6.6 01/09/2018   HGB 13.2 01/09/2018   HCT 41.4 01/09/2018   PLT 279.0 01/09/2018   GLUCOSE 103 (H) 01/09/2018   CHOL 175 01/09/2018   TRIG 130.0 01/09/2018   HDL 49.80 01/09/2018   LDLCALC 99 01/09/2018   ALT 8 01/09/2018   AST 16 01/09/2018   NA 140 01/09/2018   K 5.0 01/09/2018   CL 101 01/09/2018   CREATININE 1.26 (H) 01/09/2018   BUN 26 (H) 01/09/2018   CO2 30 01/09/2018   TSH 2.70 01/09/2018   HGBA1C 6.7 (H) 01/09/2018   MICROALBUR <0.7 09/30/2017    Lab Results  Component Value Date   TSH 2.70 01/09/2018   Lab Results  Component Value Date   WBC 6.6 01/09/2018   HGB 13.2 01/09/2018   HCT 41.4 01/09/2018   MCV 84.9 01/09/2018   PLT 279.0 01/09/2018   Lab Results  Component Value Date   NA 140 01/09/2018   K 5.0 01/09/2018   CO2 30 01/09/2018   GLUCOSE 103 (H)  01/09/2018   BUN 26 (H) 01/09/2018   CREATININE 1.26 (H) 01/09/2018   BILITOT 0.4 01/09/2018   ALKPHOS 68 01/09/2018   AST 16 01/09/2018   ALT 8 01/09/2018   PROT 7.6 01/09/2018   ALBUMIN 4.5 01/09/2018   CALCIUM 10.0 01/09/2018   GFR 42.42 (L) 01/09/2018   Lab Results  Component Value Date   CHOL 175 01/09/2018   Lab Results  Component Value Date   HDL 49.80 01/09/2018   Lab Results  Component Value Date   LDLCALC 99 01/09/2018   Lab Results  Component Value Date   TRIG 130.0 01/09/2018   Lab Results  Component Value Date   CHOLHDL 4 01/09/2018   Lab  Results  Component Value Date   HGBA1C 6.7 (H) 01/09/2018         Assessment & Plan:   Problem List Items Addressed This Visit    Essential hypertension, benign    Well controlled, no changes to meds. Encouraged heart healthy diet such as the DASH diet and exercise as tolerated.       Relevant Medications   pravastatin (PRAVACHOL) 40 MG tablet   Type 2 diabetes mellitus with peripheral neuropathy (HCC)    hgba1c acceptable, minimize simple carbs. Increase exercise as tolerated. Continue current meds      Relevant Medications   pravastatin (PRAVACHOL) 40 MG tablet   Chronic renal insufficiency    Stay well hydrated and monitor      Hyperlipidemia, mixed    Encouraged heart healthy diet, increase exercise, avoid trans fats, consider a krill oil cap daily      Relevant Medications   pravastatin (PRAVACHOL) 40 MG tablet   Vitamin B 12 deficiency    Given shot today, skip May and shot again in June      Chronic pain syndrome    Allowed refill for Tramadol which helps some. Try adding a Tylenol daily      Constipation    Discussed diet and lifestyle concerns with patient. Offered nutrition consult for further management.        Other Visit Diagnoses    High risk medication use    -  Primary   Relevant Orders   Pain Mgmt, Profile 8 w/Conf, U (Completed)      I have discontinued Bessie M. Aldama's ranitidine, lactulose, and traMADol. I have also changed her traMADol and pravastatin. Additionally, I am having her maintain her calcium carbonate, Vitamin D3, pantoprazole, citalopram, metFORMIN, diazepam, and gabapentin.  Meds ordered this encounter  Medications  . DISCONTD: traMADol (ULTRAM) 50 MG tablet    Sig: Take 1 tablet (50 mg total) by mouth every 8 (eight) hours as needed.    Dispense:  30 tablet    Refill:  1  . traMADol (ULTRAM) 50 MG tablet    Sig: Take 1 tablet (50 mg total) by mouth every 8 (eight) hours as needed for moderate pain.    Dispense:  30  tablet    Refill:  0  . pravastatin (PRAVACHOL) 40 MG tablet    Sig: Take 1 tablet (40 mg total) by mouth at bedtime.    Dispense:  90 tablet    Refill:  1    CMA served as Education administrator during this visit. History, Physical and Plan performed by medical provider. Documentation and orders reviewed and attested to.  Penni Homans, MD

## 2018-04-10 NOTE — Assessment & Plan Note (Signed)
Well controlled, no changes to meds. Encouraged heart healthy diet such as the DASH diet and exercise as tolerated.  °

## 2018-04-10 NOTE — Assessment & Plan Note (Signed)
hgba1c acceptable, minimize simple carbs. Increase exercise as tolerated. Continue current meds 

## 2018-04-10 NOTE — Assessment & Plan Note (Signed)
Encouraged heart healthy diet, increase exercise, avoid trans fats, consider a krill oil cap daily 

## 2018-04-10 NOTE — Assessment & Plan Note (Signed)
Stay well hydrated and monitor

## 2018-04-10 NOTE — Assessment & Plan Note (Signed)
Given shot today, skip May and shot again in June

## 2018-04-13 LAB — PAIN MGMT, PROFILE 8 W/CONF, U
6 Acetylmorphine: NEGATIVE ng/mL (ref <10)
ALPHAHYDROXYALPRAZOLAM: NEGATIVE ng/mL (ref <25)
ALPHAHYDROXYMIDAZOLAM: NEGATIVE ng/mL (ref <50)
AMINOCLONAZEPAM: NEGATIVE ng/mL (ref <25)
Alcohol Metabolites: NEGATIVE ng/mL (ref <500)
Alphahydroxytriazolam: NEGATIVE ng/mL (ref <50)
Amphetamines: NEGATIVE ng/mL (ref <500)
BUPRENORPHINE, URINE: NEGATIVE ng/mL (ref <5)
Benzodiazepines: POSITIVE ng/mL — AB (ref <100)
COCAINE METABOLITE: NEGATIVE ng/mL (ref <150)
Creatinine: 101.3 mg/dL
HYDROXYETHYLFLURAZEPAM: NEGATIVE ng/mL (ref <50)
LORAZEPAM: NEGATIVE ng/mL (ref <50)
MARIJUANA METABOLITE: NEGATIVE ng/mL (ref <20)
MDMA: NEGATIVE ng/mL (ref <500)
Nordiazepam: 268 ng/mL — ABNORMAL HIGH (ref <50)
Opiates: NEGATIVE ng/mL (ref <100)
Oxazepam: 868 ng/mL — ABNORMAL HIGH (ref <50)
Oxidant: NEGATIVE ug/mL (ref <200)
Oxycodone: NEGATIVE ng/mL (ref <100)
TEMAZEPAM: 739 ng/mL — AB (ref <50)
pH: 6.18 (ref 4.5–9.0)

## 2018-04-29 ENCOUNTER — Other Ambulatory Visit: Payer: Self-pay | Admitting: Family Medicine

## 2018-05-06 ENCOUNTER — Other Ambulatory Visit: Payer: Self-pay | Admitting: Family Medicine

## 2018-05-15 ENCOUNTER — Ambulatory Visit: Payer: Self-pay | Admitting: *Deleted

## 2018-05-27 ENCOUNTER — Other Ambulatory Visit: Payer: Self-pay | Admitting: Family Medicine

## 2018-05-29 ENCOUNTER — Other Ambulatory Visit: Payer: Self-pay | Admitting: Family Medicine

## 2018-05-29 NOTE — Telephone Encounter (Signed)
Requesting:  ULTRAM 50 MG Contract:  09/24/17 UDS:04/09/18 Moderate risk Last OV: 04/10/18 Next OV: 07/10/18 Last Refill: 04/10/18   Please advise

## 2018-06-02 ENCOUNTER — Other Ambulatory Visit: Payer: Self-pay | Admitting: Family Medicine

## 2018-06-02 DIAGNOSIS — F419 Anxiety disorder, unspecified: Secondary | ICD-10-CM

## 2018-06-02 NOTE — Telephone Encounter (Signed)
Requesting:VALIUM 5 MG Contract:09/24/17 UDS:04/10/18 Last OV:04/10/18 Next OV:07/10/18 Last Refill:01/09/18   Please advise

## 2018-06-04 DIAGNOSIS — E119 Type 2 diabetes mellitus without complications: Secondary | ICD-10-CM | POA: Diagnosis not present

## 2018-06-12 ENCOUNTER — Ambulatory Visit (INDEPENDENT_AMBULATORY_CARE_PROVIDER_SITE_OTHER): Payer: Medicare Other

## 2018-06-12 DIAGNOSIS — E538 Deficiency of other specified B group vitamins: Secondary | ICD-10-CM

## 2018-06-12 MED ORDER — CYANOCOBALAMIN 1000 MCG/ML IJ SOLN
1000.0000 ug | Freq: Once | INTRAMUSCULAR | Status: AC
  Administered 2018-06-12: 1000 ug via INTRAMUSCULAR

## 2018-06-12 NOTE — Progress Notes (Signed)
Pre visit review using our clinic tool,if applicable. No additional management support is needed unless otherwise documented below in the visit note.   Pt here for monthly B12 injection per order from Dr. Penni Homans due to patient having B12 deficiency.  B12 1031mcg given IM left deltoid, and pt tolerated injection well.  No complaints voiced this visit.  Next B12 injection scheduled. Will have on visit with provider.

## 2018-06-25 ENCOUNTER — Other Ambulatory Visit: Payer: Self-pay | Admitting: Family Medicine

## 2018-07-09 ENCOUNTER — Other Ambulatory Visit: Payer: Self-pay | Admitting: Family Medicine

## 2018-07-09 DIAGNOSIS — Z1231 Encounter for screening mammogram for malignant neoplasm of breast: Secondary | ICD-10-CM

## 2018-07-10 ENCOUNTER — Ambulatory Visit (INDEPENDENT_AMBULATORY_CARE_PROVIDER_SITE_OTHER): Payer: Medicare Other | Admitting: Family Medicine

## 2018-07-10 ENCOUNTER — Encounter: Payer: Self-pay | Admitting: Family Medicine

## 2018-07-10 DIAGNOSIS — E782 Mixed hyperlipidemia: Secondary | ICD-10-CM

## 2018-07-10 DIAGNOSIS — M8580 Other specified disorders of bone density and structure, unspecified site: Secondary | ICD-10-CM

## 2018-07-10 DIAGNOSIS — I10 Essential (primary) hypertension: Secondary | ICD-10-CM

## 2018-07-10 DIAGNOSIS — R000 Tachycardia, unspecified: Secondary | ICD-10-CM | POA: Diagnosis not present

## 2018-07-10 DIAGNOSIS — E1142 Type 2 diabetes mellitus with diabetic polyneuropathy: Secondary | ICD-10-CM | POA: Diagnosis not present

## 2018-07-10 DIAGNOSIS — N189 Chronic kidney disease, unspecified: Secondary | ICD-10-CM

## 2018-07-10 DIAGNOSIS — E538 Deficiency of other specified B group vitamins: Secondary | ICD-10-CM

## 2018-07-10 DIAGNOSIS — G6289 Other specified polyneuropathies: Secondary | ICD-10-CM | POA: Diagnosis not present

## 2018-07-10 MED ORDER — CYANOCOBALAMIN 1000 MCG/ML IJ SOLN
1000.0000 ug | Freq: Once | INTRAMUSCULAR | Status: AC
  Administered 2018-07-10: 1000 ug via INTRAMUSCULAR

## 2018-07-10 NOTE — Assessment & Plan Note (Signed)
hgba1c acceptable, minimize simple carbs. Increase exercise as tolerated. Continue current meds 

## 2018-07-10 NOTE — Assessment & Plan Note (Signed)
Well controlled, no changes to meds. Encouraged heart healthy diet such as the DASH diet and exercise as tolerated.  °

## 2018-07-10 NOTE — Patient Instructions (Signed)
  Encouraged increased hydration and fiber in diet. Daily probiotics. If bowels not moving can use MOM 2 tbls po in 4 oz of warm prune juice by mouth every 2-3 days. If no results then repeat in 4 hours with  Dulcolax suppository pr, may repeat again in 4 more hours as needed. Seek care if symptoms worsen. Consider daily Miralax and/or Dulcolax if symptoms persist.  Constipation, Adult Constipation is when a person:  Poops (has a bowel movement) fewer times in a week than normal.  Has a hard time pooping.  Has poop that is dry, hard, or bigger than normal.  Follow these instructions at home: Eating and drinking   Eat foods that have a lot of fiber, such as: ? Fresh fruits and vegetables. ? Whole grains. ? Beans.  Eat less of foods that are high in fat, low in fiber, or overly processed, such as: ? [place] fries. ? Hamburgers. ? Cookies. ? Candy. ? Soda.  Drink enough fluid to keep your pee (urine) clear or pale yellow. General instructions  Exercise regularly or as told by your doctor.  Go to the restroom when you feel like you need to poop. Do not hold it in.  Take over-the-counter and prescription medicines only as told by your doctor. These include any fiber supplements.  Do pelvic floor retraining exercises, such as: ? Doing deep breathing while relaxing your lower belly (abdomen). ? Relaxing your pelvic floor while pooping.  Watch your condition for any changes.  Keep all follow-up visits as told by your doctor. This is important. Contact a doctor if:  You have pain that gets worse.  You have a fever.  You have not pooped for 4 days.  You throw up (vomit).  You are not hungry.  You lose weight.  You are bleeding from the anus.  You have thin, pencil-like poop (stool). Get help right away if:  You have a fever, and your symptoms suddenly get worse.  You leak poop or have blood in your poop.  Your belly feels hard or bigger than normal (is  bloated).  You have very bad belly pain.  You feel dizzy or you faint. This information is not intended to replace advice given to you by your health care provider. Make sure you discuss any questions you have with your health care provider. Document Released: 06/04/2008 Document Revised: 07/06/2016 Document Reviewed: 06/06/2016 Elsevier Interactive Patient Education  2018 [employer].

## 2018-07-10 NOTE — Assessment & Plan Note (Signed)
Mild and will monitor

## 2018-07-10 NOTE — Assessment & Plan Note (Signed)
Encouraged heart healthy diet, increase exercise, avoid trans fats, consider a krill oil cap daily 

## 2018-07-10 NOTE — Assessment & Plan Note (Addendum)
Worse some nights than others. Try Lidocaine gel as needed

## 2018-07-10 NOTE — Assessment & Plan Note (Signed)
RRR today 

## 2018-07-10 NOTE — Assessment & Plan Note (Signed)
Encouraged to get adequate exercise, calcium and vitamin d intake 

## 2018-07-10 NOTE — Progress Notes (Signed)
Subjective:  I acted as a Education administrator for Dr. Charlett Blake. [place], [place]  Patient ID: Dana Burns, female    DOB: [birth year], 84 y.o.   MRN: [id number]  No chief complaint on file.   HPI  Patient is in today for a 3 month follow up and she notes she mostly feels well. She had a sore throat and neck last week but that has resolved without treatment. No recent febrile illness or hospitalizations. Denies CP/palp/SOB/HA/congestion/fevers/GI or GU c/o. Taking meds as prescribed  Patient Care Team: Mosie Lukes, MD as PCP - General (Family Medicine) Marica Otter, [place] as Consulting Physician (Optometry) Garlan Fair, MD as Consulting Physician (Gastroenterology)   Past Medical History:  Diagnosis Date  . Allergy    sneeze, rhinorrhea in am  . Chicken pox as a child  . Chronic pain syndrome 01/23/2015  . Colon cancer ([place])   . Complication of anesthesia    agitated when waking up, wakes up shaking, "like  I'm going into shock"  . Depression    husband died  3 months ago, pt. admits depression  currently   . Diabetes mellitus    6 years  . DM (diabetes mellitus) type 2, uncontrolled, with ketoacidosis ([place]) 03/17/2011  . Fibromyalgia   . GERD (gastroesophageal reflux disease)   . H/O echocardiogram    done /w [place] in 2011, saw Dr. Percival Spanish for tachycardia   . Headache(784.0)    occasional - "bad headaches"  . Hyperlipidemia    6 years  . Hypertension    15 years, reports she has a rapid heartrate   . Measles as a child  . Osteoarthritis    in hands & all over, feet  . Pain in finger of right hand 05/15/2017  . Pain of right shoulder region 07/07/2014  . Peripheral neuropathy   . Shortness of breath   . Type 2 diabetes mellitus with peripheral neuropathy ([place]) 03/17/2011    Past Surgical History:  Procedure Laterality Date  . BREAST EXCISIONAL BIOPSY Right 2013   benign  . BREAST SURGERY  2013   right- benign  . CATARACT EXTRACTION     /w IOL- bilateral   . COLON SURGERY      For resection of cancer  . Nodule removed     From throat    Family History  Problem Relation Age of Onset  . Cancer Mother        Brain  . Cancer Father        Colorectal  . Cancer Sister        breast  . Other Brother        pacemaker  . Cancer Brother        pancreatic cancer  . Arthritis Sister   . Emphysema Brother   . Cancer Brother   . COPD Brother   . Neuropathy Daughter   . Fibromyalgia Daughter   . Heart disease Neg Hx        Early    Social History   Socioeconomic History  . Marital status: Widowed    Spouse name: Not on file  . Number of children: 1  . Years of education: 74  . Highest education level: Not on file  Occupational History  . Occupation: Retired  Scientific laboratory technician  . Financial resource strain: Not on file  . Food insecurity:    Worry: Not on file    Inability: Not on file  . Transportation needs:    Medical:  Not on file    Non-medical: Not on file  Tobacco Use  . Smoking status: Never Smoker  . Smokeless tobacco: Never Used  Substance and Sexual Activity  . Alcohol use: No  . Drug use: No  . Sexual activity: Never    Comment: lives with daughter. no dietary restrictions  Lifestyle  . Physical activity:    Days per week: Not on file    Minutes per session: Not on file  . Stress: Not on file  Relationships  . Social connections:    Talks on phone: Not on file    Gets together: Not on file    Attends religious service: Not on file    Active member of club or organization: Not on file    Attends meetings of clubs or organizations: Not on file    Relationship status: Not on file  . Intimate partner violence:    Fear of current or ex partner: Not on file    Emotionally abused: Not on file    Physically abused: Not on file    Forced sexual activity: Not on file  Other Topics Concern  . Not on file  Social History Narrative   Patient lives at home with daughter.    Patient is retired.    Patient is widowed.    Patient has 1  child.    Patient has a 9th grade education.     Outpatient Medications Prior to Visit  Medication Sig Dispense Refill  . calcium carbonate (OS-CAL) 600 MG TABS Take 600 mg by mouth 2 (two) times daily with a meal.      . Cholecalciferol (VITAMIN D3) 5000 UNITS CAPS Take 5,000 Units by mouth daily.    . citalopram (CELEXA) 10 MG tablet TAKE 1 TABLET BY MOUTH EVERY DAY (PER INS 01/16/18) 90 tablet 0  . Cyanocobalamin (B-12 IJ) Inject as directed.    . diazepam (VALIUM) 5 MG tablet TAKE 1/2 TO 1 TABLET DAILY AS NEEDED FOR ANXIETY 30 tablet 1  . gabapentin (NEURONTIN) 800 MG tablet TAKE 1 TABLET BY MOUTH THREE TIMES A DAY 270 tablet 1  . metFORMIN (GLUCOPHAGE) 500 MG tablet Take 1 tablet (500 mg total) by mouth daily with breakfast. 90 tablet 3  . pantoprazole (PROTONIX) 40 MG tablet TAKE 1 TABLET (40 MG TOTAL) BY MOUTH DAILY. 90 tablet 1  . pravastatin (PRAVACHOL) 40 MG tablet Take 1 tablet (40 mg total) by mouth at bedtime. 90 tablet 1  . traMADol (ULTRAM) 50 MG tablet TAKE 1 TABLET (50 MG TOTAL) BY MOUTH EVERY 8 (EIGHT) HOURS AS NEEDED FOR MODERATE PAIN. 30 tablet 0   No facility-administered medications prior to visit.     Allergies  Allergen Reactions  . Tape Itching and Rash  . Actos [Pioglitazone Hydrochloride] Other (See Comments)    Unknown  . Buspar [Buspirone Hcl] Other (See Comments)    Unknown  . Lipitor [Atorvastatin Calcium] Other (See Comments)    "It made me hurt all over."  . Penicillins Rash    Review of Systems  Constitutional: Negative for fever and malaise/fatigue.  HENT: Positive for sore throat. Negative for congestion.   Eyes: Negative for blurred vision.  Respiratory: Negative for shortness of breath.   Cardiovascular: Negative for chest pain, palpitations and leg swelling.  Gastrointestinal: Negative for abdominal pain, blood in stool and nausea.  Genitourinary: Negative for dysuria and frequency.  Musculoskeletal: Negative for falls.  Skin: Negative  for rash.  Neurological: Negative for dizziness, loss  of consciousness and headaches.  Endo/Heme/Allergies: Negative for environmental allergies.  Psychiatric/Behavioral: Negative for depression. The patient is not nervous/anxious.        Objective:    Physical Exam  Constitutional: She is oriented to person, place, and time. She appears well-developed and well-nourished. No distress.  HENT:  Head: Normocephalic and atraumatic.  Nose: Nose normal.  Eyes: Right eye exhibits no discharge. Left eye exhibits no discharge.  Neck: Normal range of motion. Neck supple.  Cardiovascular: Normal rate and regular rhythm.  Murmur heard. Pulmonary/Chest: Effort normal and breath sounds normal.  Abdominal: Soft. Bowel sounds are normal. There is no tenderness.  Musculoskeletal: She exhibits no edema.  Neurological: She is alert and oriented to person, place, and time.  Skin: Skin is warm and dry.  Psychiatric: She has a normal mood and affect.  Nursing note and vitals reviewed.   BP 120/60 (BP Location: Left Arm, Patient Position: Sitting, Cuff Size: Normal)   Pulse 73   Temp 97.9 F (36.6 C) (Oral)   Resp 18   Wt 144 lb 6.4 oz (65.5 kg)   SpO2 97%   BMI 23.21 kg/m  Wt Readings from Last 3 Encounters:  07/10/18 144 lb 6.4 oz (65.5 kg)  04/10/18 149 lb (67.6 kg)  01/09/18 142 lb 9.6 oz (64.7 kg)   BP Readings from Last 3 Encounters:  07/10/18 120/60  04/10/18 124/66  01/09/18 132/60     Immunization History  Administered Date(s) Administered  . Influenza Whole 09/30/2010  . Influenza, High Dose Seasonal PF 09/24/2017  . Influenza,inj,Quad PF,6+ Mos 08/24/2014, 11/13/2016  . Influenza-Unspecified 01/04/2014, 10/13/2015  . Pneumococcal Conjugate-13 09/14/2014  . Pneumococcal Polysaccharide-23 10/27/2015  . Td 01/01/2004  . Tdap 10/17/2011    Health Maintenance  Topic Date Due  . FOOT EXAM  02/11/2018  . OPHTHALMOLOGY EXAM  04/03/2018  . HEMOGLOBIN A1C  07/09/2018  .  INFLUENZA VACCINE  07/31/2018  . URINE MICROALBUMIN  09/30/2018  . TETANUS/TDAP  10/16/2021  . DEXA SCAN  Completed  . PNA vac Low Risk Adult  Completed    Lab Results  Component Value Date   WBC 6.6 01/09/2018   HGB 13.2 01/09/2018   HCT 41.4 01/09/2018   PLT 279.0 01/09/2018   GLUCOSE 103 (H) 01/09/2018   CHOL 175 01/09/2018   TRIG 130.0 01/09/2018   HDL 49.80 01/09/2018   LDLCALC 99 01/09/2018   ALT 8 01/09/2018   AST 16 01/09/2018   NA 140 01/09/2018   K 5.0 01/09/2018   CL 101 01/09/2018   CREATININE 1.26 (H) 01/09/2018   BUN 26 (H) 01/09/2018   CO2 30 01/09/2018   TSH 2.70 01/09/2018   HGBA1C 6.7 (H) 01/09/2018   MICROALBUR <0.7 09/30/2017    Lab Results  Component Value Date   TSH 2.70 01/09/2018   Lab Results  Component Value Date   WBC 6.6 01/09/2018   HGB 13.2 01/09/2018   HCT 41.4 01/09/2018   MCV 84.9 01/09/2018   PLT 279.0 01/09/2018   Lab Results  Component Value Date   NA 140 01/09/2018   K 5.0 01/09/2018   CO2 30 01/09/2018   GLUCOSE 103 (H) 01/09/2018   BUN 26 (H) 01/09/2018   CREATININE 1.26 (H) 01/09/2018   BILITOT 0.4 01/09/2018   ALKPHOS 68 01/09/2018   AST 16 01/09/2018   ALT 8 01/09/2018   PROT 7.6 01/09/2018   ALBUMIN 4.5 01/09/2018   CALCIUM 10.0 01/09/2018   GFR 42.42 (L) 01/09/2018  Lab Results  Component Value Date   CHOL 175 01/09/2018   Lab Results  Component Value Date   HDL 49.80 01/09/2018   Lab Results  Component Value Date   LDLCALC 99 01/09/2018   Lab Results  Component Value Date   TRIG 130.0 01/09/2018   Lab Results  Component Value Date   CHOLHDL 4 01/09/2018   Lab Results  Component Value Date   HGBA1C 6.7 (H) 01/09/2018         Assessment & Plan:   Problem List Items Addressed This Visit    Essential hypertension, benign    Well controlled, no changes to meds. Encouraged heart healthy diet such as the DASH diet and exercise as tolerated.       Tachycardia    RRR today      Type  2 diabetes mellitus with peripheral neuropathy (HCC)    hgba1c acceptable, minimize simple carbs. Increase exercise as tolerated. Continue current meds      Chronic renal insufficiency    Mild and will monitor      Hyperlipidemia, mixed    Encouraged heart healthy diet, increase exercise, avoid trans fats, consider a krill oil cap daily      Osteopenia    Encouraged to get adequate exercise, calcium and vitamin d intake      Peripheral neuropathy    Worse some nights than others. Try Lidocaine gel as needed      Vitamin B 12 deficiency    Given IM injection today      Relevant Medications   Cyanocobalamin (B-12 IJ)   cyanocobalamin ((VITAMIN B-12)) injection 1,000 mcg (Completed) (Start on 07/10/2018  3:00 PM)      I am having Dana Burns maintain her calcium carbonate, Vitamin D3, pantoprazole, metFORMIN, pravastatin, citalopram, traMADol, diazepam, gabapentin, and Cyanocobalamin (B-12 IJ). We administered cyanocobalamin.  Meds ordered this encounter  Medications  . cyanocobalamin ((VITAMIN B-12)) injection 1,000 mcg    CMA served as scribe during this visit. History, Physical and Plan performed by medical provider. Documentation and orders reviewed and attested to.  Penni Homans, MD

## 2018-07-10 NOTE — Assessment & Plan Note (Signed)
Given IM injection today

## 2018-07-16 ENCOUNTER — Other Ambulatory Visit: Payer: Self-pay | Admitting: Family Medicine

## 2018-07-17 NOTE — Telephone Encounter (Signed)
Requesting:tramadol Contract:yes UDS:low risk next screen 10/09/18 Last OV:07/10/18 Next OV:10/14/18 Last Refill:05/29/18  #30-0rf Database:   Please advise

## 2018-07-18 ENCOUNTER — Telehealth: Payer: Self-pay | Admitting: Family Medicine

## 2018-07-18 NOTE — Telephone Encounter (Signed)
Spoke with Marcello Moores at [facility] [address] who states that prescription for Tramadol was received on 7/18 but medicare is requiring documentation that the doctor is aware of the opioid-benzo interaction. Marcello Moores states that a verbal order can be given over the phone and once that documentation is on file the medication can be filled.

## 2018-07-18 NOTE — Telephone Encounter (Signed)
Copied from [place] [id number]. Topic: Quick Communication - Rx Refill/Question >> Jul 18, 2018  1:23 PM Keene Breath wrote: Medication:  traMADol (ULTRAM) 50 MG tablet  Patient called to request refill on the above medication.  CB# [phone]  Preferred Pharmacy (with phone number or street name): [id number] [address], [place]. [phone] (Phone) [phone] (Fax)

## 2018-07-21 NOTE — Telephone Encounter (Signed)
Spoke with pharmacy to give verbal and diagnostic codes for medication

## 2018-07-25 ENCOUNTER — Other Ambulatory Visit: Payer: Self-pay | Admitting: Family Medicine

## 2018-08-01 ENCOUNTER — Ambulatory Visit
Admission: RE | Admit: 2018-08-01 | Discharge: 2018-08-01 | Disposition: A | Discharge status: Home / Self Care | Payer: Medicare Other | Source: Ambulatory Visit | Attending: Family Medicine | Admitting: Family Medicine

## 2018-08-01 DIAGNOSIS — Z1231 Encounter for screening mammogram for malignant neoplasm of breast: Secondary | ICD-10-CM

## 2018-08-04 ENCOUNTER — Other Ambulatory Visit: Payer: Self-pay | Admitting: Family Medicine

## 2018-08-12 ENCOUNTER — Ambulatory Visit (INDEPENDENT_AMBULATORY_CARE_PROVIDER_SITE_OTHER): Payer: Medicare Other

## 2018-08-12 DIAGNOSIS — E538 Deficiency of other specified B group vitamins: Secondary | ICD-10-CM

## 2018-08-12 MED ORDER — CYANOCOBALAMIN 1000 MCG/ML IJ SOLN
1000.0000 ug | Freq: Once | INTRAMUSCULAR | Status: AC
  Administered 2018-08-12: 1000 ug via INTRAMUSCULAR

## 2018-08-12 NOTE — Progress Notes (Signed)
Pre visit review using our clinic tool,if applicable. No additional management support is needed unless otherwise documented below in the visit note.   Pt here for monthly B12 injection per order from Dr. Frederik Pear.  B12 1048mcg given IMleft deltoid  and pt tolerated injection well.  Next B12 injection scheduled for 1 month. Patient aware.

## 2018-09-11 ENCOUNTER — Ambulatory Visit (INDEPENDENT_AMBULATORY_CARE_PROVIDER_SITE_OTHER): Payer: Medicare Other

## 2018-09-11 DIAGNOSIS — E538 Deficiency of other specified B group vitamins: Secondary | ICD-10-CM | POA: Diagnosis not present

## 2018-09-11 MED ORDER — CYANOCOBALAMIN 1000 MCG/ML IJ SOLN
1000.0000 ug | Freq: Once | INTRAMUSCULAR | Status: AC
  Administered 2018-09-11: 1000 ug via INTRAMUSCULAR

## 2018-09-11 NOTE — Progress Notes (Signed)
Pre visit review using our clinic tool,if applicable. No additional management support is needed unless otherwise documented below in the visit note.   Pt here for monthly B12 injection per order from Dr. Charlett Blake.   B12 1022mcg given IM right deltoid, and pt tolerated injection well. No complaints voiced this visit.  Next B12 injection scheduled for October 15th when patient sees Dr. Charlett Blake.

## 2018-09-15 ENCOUNTER — Other Ambulatory Visit: Payer: Self-pay | Admitting: Family Medicine

## 2018-09-16 NOTE — Telephone Encounter (Signed)
Requesting:tramadol Contract:yes JKD:[id number] risk 10/09/18 Last OV:07/10/18 Next OV:10/14/18 Last Refill:07/17/18  #30-0rf Database:   Please advise

## 2018-09-23 ENCOUNTER — Other Ambulatory Visit: Payer: Self-pay | Admitting: Family Medicine

## 2018-09-23 DIAGNOSIS — F419 Anxiety disorder, unspecified: Secondary | ICD-10-CM

## 2018-09-23 NOTE — Telephone Encounter (Signed)
Pt is requesting refill on diazepam.   Last OV: 04/10/2018, appt scheduled 10/14/2018 Last Fill: 06/02/2018 #30 and 1RF UDS: 04/09/2018 Moderate risk

## 2018-09-26 DIAGNOSIS — E119 Type 2 diabetes mellitus without complications: Secondary | ICD-10-CM | POA: Diagnosis not present

## 2018-10-08 ENCOUNTER — Other Ambulatory Visit: Payer: Self-pay | Admitting: Family Medicine

## 2018-10-14 ENCOUNTER — Encounter: Payer: Self-pay | Admitting: Family Medicine

## 2018-10-14 ENCOUNTER — Ambulatory Visit (INDEPENDENT_AMBULATORY_CARE_PROVIDER_SITE_OTHER): Payer: Medicare Other | Admitting: Family Medicine

## 2018-10-14 ENCOUNTER — Ambulatory Visit (HOSPITAL_BASED_OUTPATIENT_CLINIC_OR_DEPARTMENT_OTHER)
Admission: RE | Admit: 2018-10-14 | Discharge: 2018-10-14 | Disposition: A | Discharge status: Home / Self Care | Payer: Medicare Other | Source: Ambulatory Visit | Attending: Family Medicine | Admitting: Family Medicine

## 2018-10-14 VITALS — BP 130/78 | HR 79 | Temp 97.7°F | Resp 18 | Wt 146.8 lb

## 2018-10-14 DIAGNOSIS — I517 Cardiomegaly: Secondary | ICD-10-CM | POA: Diagnosis not present

## 2018-10-14 DIAGNOSIS — E1142 Type 2 diabetes mellitus with diabetic polyneuropathy: Secondary | ICD-10-CM | POA: Diagnosis not present

## 2018-10-14 DIAGNOSIS — Z23 Encounter for immunization: Secondary | ICD-10-CM | POA: Diagnosis not present

## 2018-10-14 DIAGNOSIS — I10 Essential (primary) hypertension: Secondary | ICD-10-CM | POA: Diagnosis not present

## 2018-10-14 DIAGNOSIS — J449 Chronic obstructive pulmonary disease, unspecified: Secondary | ICD-10-CM | POA: Insufficient documentation

## 2018-10-14 DIAGNOSIS — R0600 Dyspnea, unspecified: Secondary | ICD-10-CM

## 2018-10-14 DIAGNOSIS — R0609 Other forms of dyspnea: Secondary | ICD-10-CM | POA: Diagnosis not present

## 2018-10-14 DIAGNOSIS — N189 Chronic kidney disease, unspecified: Secondary | ICD-10-CM

## 2018-10-14 DIAGNOSIS — E538 Deficiency of other specified B group vitamins: Secondary | ICD-10-CM | POA: Diagnosis not present

## 2018-10-14 DIAGNOSIS — R0602 Shortness of breath: Secondary | ICD-10-CM | POA: Diagnosis not present

## 2018-10-14 DIAGNOSIS — E1149 Type 2 diabetes mellitus with other diabetic neurological complication: Secondary | ICD-10-CM

## 2018-10-14 DIAGNOSIS — E782 Mixed hyperlipidemia: Secondary | ICD-10-CM | POA: Diagnosis not present

## 2018-10-14 DIAGNOSIS — M797 Fibromyalgia: Secondary | ICD-10-CM

## 2018-10-14 LAB — CBC
HCT: 40 % (ref 36.0–46.0)
Hemoglobin: 12.9 g/dL (ref 12.0–15.0)
MCHC: 32.2 g/dL (ref 30.0–36.0)
MCV: 83.2 fl (ref 78.0–100.0)
Platelets: 239 10*3/uL (ref 150.0–400.0)
RBC: 4.8 Mil/uL (ref 3.87–5.11)
RDW: 16.1 % — AB (ref 11.5–15.5)
WBC: 5.5 10*3/uL (ref 4.0–10.5)

## 2018-10-14 LAB — COMPREHENSIVE METABOLIC PANEL
ALT: 9 U/L (ref 0–35)
AST: 14 U/L (ref 0–37)
Albumin: 4.3 g/dL (ref 3.5–5.2)
Alkaline Phosphatase: 68 U/L (ref 39–117)
BILIRUBIN TOTAL: 0.4 mg/dL (ref 0.2–1.2)
BUN: 20 mg/dL (ref 6–23)
CHLORIDE: 103 meq/L (ref 96–112)
CO2: 33 meq/L — AB (ref 19–32)
Calcium: 9.8 mg/dL (ref 8.4–10.5)
Creatinine, Ser: 1.2 mg/dL (ref 0.40–1.20)
GFR: 44.8 mL/min — ABNORMAL LOW (ref >60.00)
GLUCOSE: 93 mg/dL (ref 70–99)
POTASSIUM: 4.8 meq/L (ref 3.5–5.1)
Sodium: 142 mEq/L (ref 135–145)
Total Protein: 6.9 g/dL (ref 6.0–8.3)

## 2018-10-14 LAB — LIPID PANEL
CHOL/HDL RATIO: 4
Cholesterol: 162 mg/dL (ref 0–200)
HDL: 40.5 mg/dL (ref >39.00)
LDL CALC: 85 mg/dL (ref 0–99)
NonHDL: 121.41
TRIGLYCERIDES: 184 mg/dL — AB (ref 0.0–149.0)
VLDL: 36.8 mg/dL (ref 0.0–40.0)

## 2018-10-14 LAB — TSH: TSH: 2.21 u[IU]/mL (ref 0.35–4.50)

## 2018-10-14 LAB — HEMOGLOBIN A1C: Hgb A1c MFr Bld: 6.6 % — ABNORMAL HIGH (ref 4.6–6.5)

## 2018-10-14 MED ORDER — CYANOCOBALAMIN 1000 MCG/ML IJ SOLN
1000.0000 ug | Freq: Once | INTRAMUSCULAR | Status: AC
  Administered 2018-10-14: 1000 ug via INTRAMUSCULAR

## 2018-10-14 NOTE — Patient Instructions (Signed)

## 2018-10-14 NOTE — Assessment & Plan Note (Signed)
Hydrate and monitor. Avoid OTC meds without checking with [place].

## 2018-10-14 NOTE — Progress Notes (Signed)
Subjective:    Patient ID: Dana Burns, female    DOB: [birth year], 84 y.o.   MRN: [id number]  No chief complaint on file.   HPI Patient is in today for follow up and she is feeling well today. No recent febrile illness or hospitalizations. About a month ago she bent over and her blood pressure dropped and she felt like she was going to pass out. It has not recurred again and she is trying to eat well and hydrate well. She struggles with fatigue but is otherwise is doing well. Sugars well controlled and below 130. She is noting increased SOB with exertion. Even walking in the house causes SOB with even short distances. No polyuria or polydipsia. Denies CP/palp/HA/congestion/fevers/GI or GU c/o. Taking meds as prescribed  Past Medical History:  Diagnosis Date  . Allergy    sneeze, rhinorrhea in am  . Chicken pox as a child  . Chronic pain syndrome 01/23/2015  . Colon cancer ([place])   . Complication of anesthesia    agitated when waking up, wakes up shaking, "like  I'm going into shock"  . Depression    husband died  3 months ago, pt. admits depression  currently   . Diabetes mellitus    6 years  . DM (diabetes mellitus) type 2, uncontrolled, with ketoacidosis ([place]) 03/17/2011  . Fibromyalgia   . GERD (gastroesophageal reflux disease)   . H/O echocardiogram    done /w [place] in 2011, saw Dr. Percival Spanish for tachycardia   . Headache(784.0)    occasional - "bad headaches"  . Hyperlipidemia    6 years  . Hypertension    15 years, reports she has a rapid heartrate   . Measles as a child  . Osteoarthritis    in hands & all over, feet  . Pain in finger of right hand 05/15/2017  . Pain of right shoulder region 07/07/2014  . Peripheral neuropathy   . Shortness of breath   . Type 2 diabetes mellitus with peripheral neuropathy ([place]) 03/17/2011    Past Surgical History:  Procedure Laterality Date  . BREAST EXCISIONAL BIOPSY Right 2013   benign  . BREAST SURGERY  2013   right- benign  .  CATARACT EXTRACTION     /w IOL- bilateral   . COLON SURGERY     For resection of cancer  . Nodule removed     From throat    Family History  Problem Relation Age of Onset  . Cancer Mother        Brain  . Cancer Father        Colorectal  . Cancer Sister        breast  . Other Brother        pacemaker  . Cancer Brother        pancreatic cancer  . Arthritis Sister   . Emphysema Brother   . Cancer Brother   . COPD Brother   . Neuropathy Daughter   . Fibromyalgia Daughter   . Heart disease Neg Hx        Early    Social History   Socioeconomic History  . Marital status: Widowed    Spouse name: Not on file  . Number of children: 1  . Years of education: 67  . Highest education level: Not on file  Occupational History  . Occupation: Retired  Scientific laboratory technician  . Financial resource strain: Not on file  . Food insecurity:    Worry: Not on  file    Inability: Not on file  . Transportation needs:    Medical: Not on file    Non-medical: Not on file  Tobacco Use  . Smoking status: Never Smoker  . Smokeless tobacco: Never Used  Substance and Sexual Activity  . Alcohol use: No  . Drug use: No  . Sexual activity: Never    Comment: lives with daughter. no dietary restrictions  Lifestyle  . Physical activity:    Days per week: Not on file    Minutes per session: Not on file  . Stress: Not on file  Relationships  . Social connections:    Talks on phone: Not on file    Gets together: Not on file    Attends religious service: Not on file    Active member of club or organization: Not on file    Attends meetings of clubs or organizations: Not on file    Relationship status: Not on file  . Intimate partner violence:    Fear of current or ex partner: Not on file    Emotionally abused: Not on file    Physically abused: Not on file    Forced sexual activity: Not on file  Other Topics Concern  . Not on file  Social History Narrative   Patient lives at home with daughter.     Patient is retired.    Patient is widowed.    Patient has 1 child.    Patient has a 9th grade education.     Outpatient Medications Prior to Visit  Medication Sig Dispense Refill  . calcium carbonate (OS-CAL) 600 MG TABS Take 600 mg by mouth 2 (two) times daily with a meal.      . Cholecalciferol (VITAMIN D3) 5000 UNITS CAPS Take 500 Units by mouth daily.     . citalopram (CELEXA) 10 MG tablet TAKE 1 TABLET BY MOUTH EVERY DAY (PER INS 01/16/18) 90 tablet 0  . Cyanocobalamin (B-12 IJ) Inject as directed.    . diazepam (VALIUM) 5 MG tablet TAKE 1/2 TO 1 TABLET DAILY AS NEEDED FOR ANXIETY 30 tablet 1  . gabapentin (NEURONTIN) 800 MG tablet TAKE 1 TABLET BY MOUTH THREE TIMES A DAY 270 tablet 1  . metFORMIN (GLUCOPHAGE) 500 MG tablet Take 1 tablet (500 mg total) by mouth daily with breakfast. 90 tablet 3  . pantoprazole (PROTONIX) 40 MG tablet TAKE 1 TABLET BY MOUTH EVERY DAY 90 tablet 1  . pravastatin (PRAVACHOL) 40 MG tablet TAKE 1 TABLET BY MOUTH EVERYDAY AT BEDTIME 90 tablet 1  . traMADol (ULTRAM) 50 MG tablet TAKE 1 TABLET BY MOUTH EVERY 8 HOURS AS NEEDED FOR MODERATE PAIN 30 tablet 0   No facility-administered medications prior to visit.     Allergies  Allergen Reactions  . Tape Itching and Rash  . Actos [Pioglitazone Hydrochloride] Other (See Comments)    Unknown  . Buspar [Buspirone Hcl] Other (See Comments)    Unknown  . Lipitor [Atorvastatin Calcium] Other (See Comments)    "It made me hurt all over."  . Penicillins Rash    Review of Systems  Constitutional: Positive for malaise/fatigue. Negative for fever.  HENT: Negative for congestion.   Eyes: Negative for blurred vision.  Respiratory: Positive for shortness of breath.   Cardiovascular: Negative for chest pain, palpitations and leg swelling.  Gastrointestinal: Negative for abdominal pain, blood in stool and nausea.  Genitourinary: Negative for dysuria and frequency.  Musculoskeletal: Positive for joint pain. Negative  for falls.  Skin: Negative for rash.  Neurological: Negative for dizziness, loss of consciousness and headaches.  Endo/Heme/Allergies: Negative for environmental allergies.  Psychiatric/Behavioral: Negative for depression. The patient is not nervous/anxious.        Objective:    Physical Exam  Constitutional: She is oriented to person, place, and time. She appears well-developed and well-nourished. No distress.  HENT:  Head: Normocephalic and atraumatic.  Nose: Nose normal.  Eyes: Right eye exhibits no discharge. Left eye exhibits no discharge.  Neck: Normal range of motion. Neck supple.  Cardiovascular: Normal rate and regular rhythm.  No murmur heard. Pulmonary/Chest: Effort normal and breath sounds normal.  Abdominal: Soft. Bowel sounds are normal. There is no tenderness.  Musculoskeletal: She exhibits no edema.  Neurological: She is alert and oriented to person, place, and time.  Skin: Skin is warm and dry.  Psychiatric: She has a normal mood and affect.  Nursing note and vitals reviewed.   BP 130/78 (BP Location: Left Arm, Patient Position: Sitting, Cuff Size: Normal)   Pulse 79   Temp 97.7 F (36.5 C) (Oral)   Resp 18   Wt 146 lb 12.8 oz (66.6 kg)   SpO2 98%   BMI 23.59 kg/m  Wt Readings from Last 3 Encounters:  10/14/18 146 lb 12.8 oz (66.6 kg)  07/10/18 144 lb 6.4 oz (65.5 kg)  04/10/18 149 lb (67.6 kg)     Lab Results  Component Value Date   WBC 6.6 01/09/2018   HGB 13.2 01/09/2018   HCT 41.4 01/09/2018   PLT 279.0 01/09/2018   GLUCOSE 103 (H) 01/09/2018   CHOL 175 01/09/2018   TRIG 130.0 01/09/2018   HDL 49.80 01/09/2018   LDLCALC 99 01/09/2018   ALT 8 01/09/2018   AST 16 01/09/2018   NA 140 01/09/2018   K 5.0 01/09/2018   CL 101 01/09/2018   CREATININE 1.26 (H) 01/09/2018   BUN 26 (H) 01/09/2018   CO2 30 01/09/2018   TSH 2.70 01/09/2018   HGBA1C 6.7 (H) 01/09/2018   MICROALBUR <0.7 09/30/2017    Lab Results  Component Value Date   TSH  2.70 01/09/2018   Lab Results  Component Value Date   WBC 6.6 01/09/2018   HGB 13.2 01/09/2018   HCT 41.4 01/09/2018   MCV 84.9 01/09/2018   PLT 279.0 01/09/2018   Lab Results  Component Value Date   NA 140 01/09/2018   K 5.0 01/09/2018   CO2 30 01/09/2018   GLUCOSE 103 (H) 01/09/2018   BUN 26 (H) 01/09/2018   CREATININE 1.26 (H) 01/09/2018   BILITOT 0.4 01/09/2018   ALKPHOS 68 01/09/2018   AST 16 01/09/2018   ALT 8 01/09/2018   PROT 7.6 01/09/2018   ALBUMIN 4.5 01/09/2018   CALCIUM 10.0 01/09/2018   GFR 42.42 (L) 01/09/2018   Lab Results  Component Value Date   CHOL 175 01/09/2018   Lab Results  Component Value Date   HDL 49.80 01/09/2018   Lab Results  Component Value Date   LDLCALC 99 01/09/2018   Lab Results  Component Value Date   TRIG 130.0 01/09/2018   Lab Results  Component Value Date   CHOLHDL 4 01/09/2018   Lab Results  Component Value Date   HGBA1C 6.7 (H) 01/09/2018       Assessment & Plan:   Problem List Items Addressed This Visit    Essential hypertension, benign    Well controlled, no changes to meds. Encouraged heart healthy diet such as the DASH  diet and exercise as tolerated.       Relevant Orders   CBC   TSH   Diabetic neuropathy ([place])    Sugars are now in 100 to 127 range. Is watching her carbohydrate level      Chronic renal insufficiency    Hydrate and monitor. Avoid OTC meds without checking with [place].      Relevant Orders   Comprehensive metabolic panel   Fibromyalgia    Leg muscle aches are extreme now so we are going to hold the med for a month and reassess. Consider switch to Crestor if improves      Hyperlipidemia, mixed   Relevant Orders   Lipid panel   Vitamin B 12 deficiency - Primary   Relevant Medications   cyanocobalamin ((VITAMIN B-12)) injection 1,000 mcg (Completed)    Other Visit Diagnoses    DOE (dyspnea on exertion)       Relevant Orders   ECHOCARDIOGRAM COMPLETE   Dyspnea, unspecified type        Relevant Orders   DG Chest 2 View   Type 2 diabetes mellitus with diabetic polyneuropathy, without long-term current use of insulin (HCC)       Relevant Orders   Hemoglobin A1c      I am having Bunny M. Krabbenhoft maintain her calcium carbonate, Vitamin D3, metFORMIN, gabapentin, Cyanocobalamin (B-12 IJ), pantoprazole, citalopram, traMADol, diazepam, and pravastatin. We administered cyanocobalamin.  Meds ordered this encounter  Medications  . cyanocobalamin ((VITAMIN B-12)) injection 1,000 mcg     Penni Homans, MD

## 2018-10-14 NOTE — Assessment & Plan Note (Signed)
Well controlled, no changes to meds. Encouraged heart healthy diet such as the DASH diet and exercise as tolerated.  °

## 2018-10-14 NOTE — Assessment & Plan Note (Signed)
Sugars are now in 100 to 127 range. Is watching her carbohydrate level

## 2018-10-14 NOTE — Assessment & Plan Note (Signed)
Leg muscle aches are extreme now so we are going to hold the med for a month and reassess. Consider switch to Crestor if improves

## 2018-10-30 ENCOUNTER — Other Ambulatory Visit: Payer: Self-pay | Admitting: Family Medicine

## 2018-11-13 ENCOUNTER — Other Ambulatory Visit: Payer: Self-pay

## 2018-11-13 NOTE — Patient Outreach (Signed)
[facility]) Care Management  11/13/2018  Dana Burns 08-20-1926 [id number]   Medication Adherence call to Mrs. Dana Burns spoke with patient she is no longer taking Pravastatin 40 mg patient is having side effects doctor told her to stop the medication.Dana Burns is showing past due under [place].   [facility] Management Direct Dial [phone]  Fax [phone] [email or web address]

## 2018-11-18 ENCOUNTER — Ambulatory Visit (INDEPENDENT_AMBULATORY_CARE_PROVIDER_SITE_OTHER): Payer: Medicare Other

## 2018-11-18 DIAGNOSIS — E538 Deficiency of other specified B group vitamins: Secondary | ICD-10-CM | POA: Diagnosis not present

## 2018-11-18 MED ORDER — CYANOCOBALAMIN 1000 MCG/ML IJ SOLN
1000.0000 ug | Freq: Once | INTRAMUSCULAR | Status: AC
  Administered 2018-11-18: 1000 ug via INTRAMUSCULAR

## 2018-12-17 ENCOUNTER — Other Ambulatory Visit: Payer: Self-pay | Admitting: Family Medicine

## 2018-12-18 ENCOUNTER — Ambulatory Visit (INDEPENDENT_AMBULATORY_CARE_PROVIDER_SITE_OTHER): Payer: Medicare Other

## 2018-12-18 DIAGNOSIS — E538 Deficiency of other specified B group vitamins: Secondary | ICD-10-CM | POA: Diagnosis not present

## 2018-12-18 MED ORDER — CYANOCOBALAMIN 1000 MCG/ML IJ SOLN
1000.0000 ug | Freq: Once | INTRAMUSCULAR | Status: AC
  Administered 2018-12-18: 1000 ug via INTRAMUSCULAR

## 2018-12-18 NOTE — Progress Notes (Signed)
Pre visit review using our clinic tool,if applicable. No additional management support is needed unless otherwise documented below in the visit note.   Pt here for monthly B12 injection per order from Dr. Charlett Blake.   B12 1069mcg given IM, and pt tolerated injection well.  Next B12 injection scheduled for January 20, 2019.

## 2018-12-22 ENCOUNTER — Other Ambulatory Visit: Payer: Self-pay | Admitting: Family Medicine

## 2018-12-22 MED ORDER — METFORMIN HCL 500 MG PO TABS: 500.0000 mg | ORAL_TABLET | Freq: Every day | ORAL | 3 refills | Status: DC

## 2018-12-22 NOTE — Telephone Encounter (Signed)
Requested medication (s) are due for refill today: yes  Requested medication (s) are on the active medication list: yes  Last refill:  12/17/17 for 90 tabs and 3 refills  Future visit scheduled: yes  Notes to clinic:  Biguanides failed. Prescription expired on 12/17/18  Requested Prescriptions  Pending Prescriptions Disp Refills   metFORMIN (GLUCOPHAGE) 500 MG tablet 90 tablet 3    Sig: Take 1 tablet (500 mg total) by mouth daily with breakfast.     Endocrinology:  Diabetes - Biguanides Failed - 12/22/2018 11:44 AM      Failed - eGFR in normal range and within 360 days    GFR, Est African American  Date Value Ref Range Status  11/13/2016 37 (L) >=60 mL/min Final   GFR, Est Non African American  Date Value Ref Range Status  11/13/2016 32 (L) >=60 mL/min Final   GFR  Date Value Ref Range Status  10/14/2018 44.80 (L) >60.00 mL/min Final         Passed - Cr in normal range and within 360 days    Creat  Date Value Ref Range Status  11/13/2016 1.44 (H) 0.60 - 0.88 mg/dL Final    Comment:      For patients > or = 82 years of age: The upper reference limit for Creatinine is approximately 13% higher for people identified as African-American.      Creatinine, Ser  Date Value Ref Range Status  10/14/2018 1.20 0.40 - 1.20 mg/dL Final         Passed - HBA1C is between 0 and 7.9 and within 180 days    Hgb A1c MFr Bld  Date Value Ref Range Status  10/14/2018 6.6 (H) 4.6 - 6.5 % Final    Comment:    Glycemic Control Guidelines for People with Diabetes:Non Diabetic:  <6%Goal of Therapy: <7%Additional Action Suggested:  >8%          Passed - Valid encounter within last 6 months    Recent Outpatient Visits          2 months ago Vitamin B 12 deficiency   [facility] [place], MD   5 months ago Vitamin B 12 deficiency   [facility] [place], MD   8 months ago High risk  medication use   [facility] [place], MD   11 months ago 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

## 2018-12-22 NOTE — Telephone Encounter (Signed)
Copied from [place] [id number]. Topic: Quick Communication - Rx Refill/Question >> Dec 22, 2018 10:48 AM Virl Axe D wrote: Medication: metFORMIN (GLUCOPHAGE) 500 MG tablet / Pt stated she called the pharmacy to request a refill and they sent a request for gabapentin instead. Pt has been out of metformin since Wednesday 12/17/18 and stated she needs it for her blood sugar. Please advise  Has the patient contacted their pharmacy? Yes.   (Agent: If no, request that the patient contact the pharmacy for the refill.) (Agent: If yes, when and what did the pharmacy advise?)  Preferred Pharmacy (with phone number or street name): [id number] [address], [place]. [phone] (Phone) [phone] (Fax)   Agent: Please be advised that RX refills may take up to 3 business days. We ask that you follow-up with your pharmacy.

## 2018-12-22 NOTE — Telephone Encounter (Signed)
Pt  Is calling to state  The pharmacy  Has denied her medication and she is completely out . Please advise

## 2018-12-23 ENCOUNTER — Other Ambulatory Visit: Payer: Self-pay | Admitting: Family Medicine

## 2018-12-23 DIAGNOSIS — G894 Chronic pain syndrome: Secondary | ICD-10-CM

## 2018-12-25 NOTE — Telephone Encounter (Signed)
Requesting: Tramadol 50mg  every 8 hr prn Contract: 2016 UDS: 04/10/18 Last OV: 10/14/18 Next Ov: 02/05/19 Last refill: 09/16/18, #30, 0RF Database: unable to gain access  Please advise.

## 2018-12-26 DIAGNOSIS — E119 Type 2 diabetes mellitus without complications: Secondary | ICD-10-CM | POA: Diagnosis not present

## 2019-01-20 ENCOUNTER — Ambulatory Visit: Payer: Medicare Other | Admitting: Family Medicine

## 2019-01-20 ENCOUNTER — Ambulatory Visit: Payer: Medicare Other

## 2019-01-25 ENCOUNTER — Other Ambulatory Visit: Payer: Self-pay | Admitting: Family Medicine

## 2019-01-26 ENCOUNTER — Other Ambulatory Visit: Payer: Self-pay | Admitting: Family Medicine

## 2019-01-26 DIAGNOSIS — F419 Anxiety disorder, unspecified: Secondary | ICD-10-CM

## 2019-01-27 NOTE — Telephone Encounter (Signed)
Requesting:valium  Contract:yes UDS:low risk next screen 10/09/18 Last OV:10/14/18 Next OV:02/13/19 Last Refill:09/23/18  #[id number] Database:   Please advise

## 2019-02-05 ENCOUNTER — Ambulatory Visit: Payer: Medicare Other | Admitting: Family Medicine

## 2019-02-13 ENCOUNTER — Ambulatory Visit (INDEPENDENT_AMBULATORY_CARE_PROVIDER_SITE_OTHER): Payer: Medicare Other | Admitting: Family Medicine

## 2019-02-13 ENCOUNTER — Encounter: Payer: Self-pay | Admitting: Family Medicine

## 2019-02-13 DIAGNOSIS — E538 Deficiency of other specified B group vitamins: Secondary | ICD-10-CM

## 2019-02-13 DIAGNOSIS — I10 Essential (primary) hypertension: Secondary | ICD-10-CM | POA: Diagnosis not present

## 2019-02-13 DIAGNOSIS — E1142 Type 2 diabetes mellitus with diabetic polyneuropathy: Secondary | ICD-10-CM

## 2019-02-13 DIAGNOSIS — E782 Mixed hyperlipidemia: Secondary | ICD-10-CM | POA: Diagnosis not present

## 2019-02-13 DIAGNOSIS — M8580 Other specified disorders of bone density and structure, unspecified site: Secondary | ICD-10-CM

## 2019-02-13 DIAGNOSIS — R0602 Shortness of breath: Secondary | ICD-10-CM

## 2019-02-13 DIAGNOSIS — R000 Tachycardia, unspecified: Secondary | ICD-10-CM

## 2019-02-13 DIAGNOSIS — K5900 Constipation, unspecified: Secondary | ICD-10-CM

## 2019-02-13 DIAGNOSIS — G894 Chronic pain syndrome: Secondary | ICD-10-CM

## 2019-02-13 DIAGNOSIS — N189 Chronic kidney disease, unspecified: Secondary | ICD-10-CM

## 2019-02-13 LAB — LIPID PANEL
Cholesterol: 271 mg/dL — ABNORMAL HIGH (ref 0–200)
HDL: 41.4 mg/dL (ref >39.00)
LDL Cholesterol: 192 mg/dL — ABNORMAL HIGH (ref 0–99)
NonHDL: 229.13
Total CHOL/HDL Ratio: 7
Triglycerides: 186 mg/dL — ABNORMAL HIGH (ref 0.0–149.0)
VLDL: 37.2 mg/dL (ref 0.0–40.0)

## 2019-02-13 LAB — HEMOGLOBIN A1C: Hgb A1c MFr Bld: 6.7 % — ABNORMAL HIGH (ref 4.6–6.5)

## 2019-02-13 LAB — COMPREHENSIVE METABOLIC PANEL
ALT: 7 U/L (ref 0–35)
AST: 14 U/L (ref 0–37)
Albumin: 4.2 g/dL (ref 3.5–5.2)
Alkaline Phosphatase: 68 U/L (ref 39–117)
BUN: 22 mg/dL (ref 6–23)
CO2: 32 mEq/L (ref 19–32)
Calcium: 9.8 mg/dL (ref 8.4–10.5)
Chloride: 102 mEq/L (ref 96–112)
Creatinine, Ser: 1.31 mg/dL — ABNORMAL HIGH (ref 0.40–1.20)
GFR: 38.07 mL/min — ABNORMAL LOW (ref >60.00)
Glucose, Bld: 114 mg/dL — ABNORMAL HIGH (ref 70–99)
POTASSIUM: 4.9 meq/L (ref 3.5–5.1)
SODIUM: 141 meq/L (ref 135–145)
Total Bilirubin: 0.5 mg/dL (ref 0.2–1.2)
Total Protein: 6.8 g/dL (ref 6.0–8.3)

## 2019-02-13 LAB — CBC
HCT: 41.8 % (ref 36.0–46.0)
Hemoglobin: 13.5 g/dL (ref 12.0–15.0)
MCHC: 32.2 g/dL (ref 30.0–36.0)
MCV: 83.3 fl (ref 78.0–100.0)
Platelets: 269 10*3/uL (ref 150.0–400.0)
RBC: 5.02 Mil/uL (ref 3.87–5.11)
RDW: 16.2 % — AB (ref 11.5–15.5)
WBC: 4.5 10*3/uL (ref 4.0–10.5)

## 2019-02-13 LAB — TSH: TSH: 3.33 u[IU]/mL (ref 0.35–4.50)

## 2019-02-13 LAB — MICROALBUMIN / CREATININE URINE RATIO
Creatinine,U: 141.1 mg/dL
Microalb Creat Ratio: 1.2 mg/g (ref 0.0–30.0)
Microalb, Ur: 1.7 mg/dL (ref 0.0–1.9)

## 2019-02-13 LAB — VITAMIN B12: Vitamin B-12: >1525 pg/mL — ABNORMAL HIGH (ref 211–911)

## 2019-02-13 NOTE — Assessment & Plan Note (Signed)
hgba1c acceptable, minimize simple carbs. Increase exercise as tolerated.  

## 2019-02-13 NOTE — Assessment & Plan Note (Signed)
Tolerating statin, encouraged heart healthy diet, avoid trans fats, minimize simple carbs and saturated fats. Increase exercise as tolerated 

## 2019-02-13 NOTE — Assessment & Plan Note (Signed)
Imaging showed some COPD and she is offered an echo to further evaluate but declines. She feels it is not that bad will let us know.

## 2019-02-13 NOTE — Assessment & Plan Note (Signed)
Neck pain is the worst she manages well most days, sleeping well. No changes at this time

## 2019-02-13 NOTE — Assessment & Plan Note (Signed)
RRR today 

## 2019-02-13 NOTE — Assessment & Plan Note (Signed)
She declines further bone density scans. Encouraged to get adequate exercise, calcium and vitamin d intake

## 2019-02-13 NOTE — Assessment & Plan Note (Signed)
Intermittent and can go 3 days with nothing then can go multiple times a day. Encouraged increased hydration and fiber in diet. Daily probiotics. If bowels not moving can use MOM 2 tbls po in 4 oz of warm prune juice by mouth every 2-3 days. If no results then repeat in 4 hours with  Dulcolax suppository pr, may repeat again in 4 more hours as needed. Seek care if symptoms worsen. Consider daily Miralax and/or Dulcolax if symptoms persist.

## 2019-02-13 NOTE — Patient Instructions (Addendum)
Encouraged increased hydration and fiber in diet. Daily probiotics. If bowels not moving can use MOM 2 tbls po in 4 oz of warm prune juice by mouth every 2-3 days. If no results then repeat in 4 hours with  Dulcolax suppository pr, may repeat again in 4 more hours as needed. Seek care if symptoms worsen. Consider daily Miralax and/or Dulcolax if symptoms persist.   Hypertension Hypertension, commonly called high blood pressure, is when the force of blood pumping through the arteries is too strong. The arteries are the blood vessels that carry blood from the heart throughout the body. Hypertension forces the heart to work harder to pump blood and may cause arteries to become narrow or stiff. Having untreated or uncontrolled hypertension can cause heart attacks, strokes, kidney disease, and other problems. A blood pressure reading consists of a higher number over a lower number. Ideally, your blood pressure should be below 120/80. The first ("top") number is called the systolic pressure. It is a measure of the pressure in your arteries as your heart beats. The second ("bottom") number is called the diastolic pressure. It is a measure of the pressure in your arteries as the heart relaxes. What are the causes? The cause of this condition is not known. What increases the risk? Some risk factors for high blood pressure are under your control. Others are not. Factors you can change  Smoking.  Having type 2 diabetes mellitus, high cholesterol, or both.  Not getting enough exercise or physical activity.  Being overweight.  Having too much fat, sugar, calories, or salt (sodium) in your diet.  Drinking too much alcohol. Factors that are difficult or impossible to change  Having chronic kidney disease.  Having a family history of high blood pressure.  Age. Risk increases with age.  Race. You may be at higher risk if you are African-American.  Gender. Men are at higher risk than women before age  15. After age 71, women are at higher risk than men.  Having obstructive sleep apnea.  Stress. What are the signs or symptoms? Extremely high blood pressure (hypertensive crisis) may cause:  Headache.  Anxiety.  Shortness of breath.  Nosebleed.  Nausea and vomiting.  Severe chest pain.  Jerky movements you cannot control (seizures). How is this diagnosed? This condition is diagnosed by measuring your blood pressure while you are seated, with your arm resting on a surface. The cuff of the blood pressure monitor will be placed directly against the skin of your upper arm at the level of your heart. It should be measured at least twice using the same arm. Certain conditions can cause a difference in blood pressure between your right and left arms. Certain factors can cause blood pressure readings to be lower or higher than normal (elevated) for a short period of time:  When your blood pressure is higher when you are in a health care provider's office than when you are at home, this is called white coat hypertension. Most people with this condition do not need medicines.  When your blood pressure is higher at home than when you are in a health care provider's office, this is called masked hypertension. Most people with this condition may need medicines to control blood pressure. If you have a high blood pressure reading during one visit or you have normal blood pressure with other risk factors:  You may be asked to return on a different day to have your blood pressure checked again.  You may be asked  to monitor your blood pressure at home for 1 week or longer. If you are diagnosed with hypertension, you may have other blood or imaging tests to help your health care provider understand your overall risk for other conditions. How is this treated? This condition is treated by making healthy lifestyle changes, such as eating healthy foods, exercising more, and reducing your alcohol intake.  Your health care provider may prescribe medicine if lifestyle changes are not enough to get your blood pressure under control, and if:  Your systolic blood pressure is above 130.  Your diastolic blood pressure is above 80. Your personal target blood pressure may vary depending on your medical conditions, your age, and other factors. Follow these instructions at home: Eating and drinking   Eat a diet that is high in fiber and potassium, and low in sodium, added sugar, and fat. An example eating plan is called the DASH (Dietary Approaches to Stop Hypertension) diet. To eat this way: ? Eat plenty of fresh fruits and vegetables. Try to fill half of your plate at each meal with fruits and vegetables. ? Eat whole grains, such as whole wheat pasta, brown rice, or whole grain bread. Fill about one quarter of your plate with whole grains. ? Eat or drink low-fat dairy products, such as skim milk or low-fat yogurt. ? Avoid fatty cuts of meat, processed or cured meats, and poultry with skin. Fill about one quarter of your plate with lean proteins, such as fish, chicken without skin, beans, eggs, and tofu. ? Avoid premade and processed foods. These tend to be higher in sodium, added sugar, and fat.  Reduce your daily sodium intake. Most people with hypertension should eat less than 1,500 mg of sodium a day.  Limit alcohol intake to no more than 1 drink a day for nonpregnant women and 2 drinks a day for men. One drink equals 12 oz of beer, 5 oz of wine, or 1 oz of hard liquor. Lifestyle   Work with your health care provider to maintain a healthy body weight or to lose weight. Ask what an ideal weight is for you.  Get at least 30 minutes of exercise that causes your heart to beat faster (aerobic exercise) most days of the week. Activities may include walking, swimming, or biking.  Include exercise to strengthen your muscles (resistance exercise), such as pilates or lifting weights, as part of your  weekly exercise routine. Try to do these types of exercises for 30 minutes at least 3 days a week.  Do not use any products that contain nicotine or tobacco, such as cigarettes and e-cigarettes. If you need help quitting, ask your health care provider.  Monitor your blood pressure at home as told by your health care provider.  Keep all follow-up visits as told by your health care provider. This is important. Medicines  Take over-the-counter and prescription medicines only as told by your health care provider. Follow directions carefully. Blood pressure medicines must be taken as prescribed.  Do not skip doses of blood pressure medicine. Doing this puts you at risk for problems and can make the medicine less effective.  Ask your health care provider about side effects or reactions to medicines that you should watch for. Contact a health care provider if:  You think you are having a reaction to a medicine you are taking.  You have headaches that keep coming back (recurring).  You feel dizzy.  You have swelling in your ankles.  You have trouble with  your vision. Get help right away if:  You develop a severe headache or confusion.  You have unusual weakness or numbness.  You feel faint.  You have severe pain in your chest or abdomen.  You vomit repeatedly.  You have trouble breathing. Summary  Hypertension is when the force of blood pumping through your arteries is too strong. If this condition is not controlled, it may put you at risk for serious complications.  Your personal target blood pressure may vary depending on your medical conditions, your age, and other factors. For most people, a normal blood pressure is less than 120/80.  Hypertension is treated with lifestyle changes, medicines, or a combination of both. Lifestyle changes include weight loss, eating a healthy, low-sodium diet, exercising more, and limiting alcohol. This information is not intended to replace  advice given to you by your health care provider. Make sure you discuss any questions you have with your health care provider. Document Released: 12/17/2005 Document Revised: 11/14/2016 Document Reviewed: 11/14/2016 Elsevier Interactive Patient Education  2019 [employer].

## 2019-02-13 NOTE — Assessment & Plan Note (Signed)
Supplement and monitor 

## 2019-02-13 NOTE — Assessment & Plan Note (Signed)
Well controlled, no changes to meds. Encouraged heart healthy diet such as the DASH diet and exercise as tolerated.  °

## 2019-02-13 NOTE — Assessment & Plan Note (Signed)
Monitor and hydrate 

## 2019-02-16 NOTE — Progress Notes (Signed)
Subjective:    Patient ID: Dana Burns, female    DOB: Aug 18, 1926, 85 y.o.   MRN: [id number]  No chief complaint on file.   HPI Patient is in today for for follow-up.  She continues to struggle with left-sided neck pain it is better and worse some days.  No recent fall or injury.  No weakness or radicular symptoms into the hand.  She continues to struggle with intermittent constipation as well.  Go 3 days without moving her bowels and has to strain at times.  No bloody or tarry stool.  No fevers or chills.  No recent febrile illness or hospitalization.  She continues to struggle with ongoing fatigue and debility.  Notes shortness of breath with exertion at times.  It is stable.  Past Medical History:  Diagnosis Date  . Allergy    sneeze, rhinorrhea in am  . Chicken pox as a child  . Chronic pain syndrome 01/23/2015  . Colon cancer ([place])   . Complication of anesthesia    agitated when waking up, wakes up shaking, "like  I'm going into shock"  . Depression    husband died  3 months ago, pt. admits depression  currently   . Diabetes mellitus    6 years  . DM (diabetes mellitus) type 2, uncontrolled, with ketoacidosis ([place]) 03/17/2011  . Fibromyalgia   . GERD (gastroesophageal reflux disease)   . H/O echocardiogram    done /w [place] in 2011, saw Dr. Percival Spanish for tachycardia   . Headache(784.0)    occasional - "bad headaches"  . Hyperlipidemia    6 years  . Hypertension    15 years, reports she has a rapid heartrate   . Measles as a child  . Osteoarthritis    in hands & all over, feet  . Pain in finger of right hand 05/15/2017  . Pain of right shoulder region 07/07/2014  . Peripheral neuropathy   . Shortness of breath   . Type 2 diabetes mellitus with peripheral neuropathy ([place]) 03/17/2011    Past Surgical History:  Procedure Laterality Date  . BREAST EXCISIONAL BIOPSY Right 2013   benign  . BREAST SURGERY  2013   right- benign  . CATARACT EXTRACTION     /w IOL- bilateral    . COLON SURGERY     For resection of cancer  . Nodule removed     From throat    Family History  Problem Relation Age of Onset  . Cancer Mother        Brain  . Cancer Father        Colorectal  . Cancer Sister        breast  . Other Brother        pacemaker  . Cancer Brother        pancreatic cancer  . Arthritis Sister   . Emphysema Brother   . Cancer Brother   . COPD Brother   . Neuropathy Daughter   . Fibromyalgia Daughter   . Cancer Brother        metastatic colon cancer  . Heart disease Neg Hx        Early    Social History   Socioeconomic History  . Marital status: Widowed    Spouse name: Not on file  . Number of children: 1  . Years of education: 46  . Highest education level: Not on file  Occupational History  . Occupation: Retired  Scientific laboratory technician  . Emergency planning/management officer  strain: Not on file  . Food insecurity:    Worry: Not on file    Inability: Not on file  . Transportation needs:    Medical: Not on file    Non-medical: Not on file  Tobacco Use  . Smoking status: Never Smoker  . Smokeless tobacco: Never Used  Substance and Sexual Activity  . Alcohol use: No  . Drug use: No  . Sexual activity: Never    Comment: lives with daughter. no dietary restrictions  Lifestyle  . Physical activity:    Days per week: Not on file    Minutes per session: Not on file  . Stress: Not on file  Relationships  . Social connections:    Talks on phone: Not on file    Gets together: Not on file    Attends religious service: Not on file    Active member of club or organization: Not on file    Attends meetings of clubs or organizations: Not on file    Relationship status: Not on file  . Intimate partner violence:    Fear of current or ex partner: Not on file    Emotionally abused: Not on file    Physically abused: Not on file    Forced sexual activity: Not on file  Other Topics Concern  . Not on file  Social History Narrative   Patient lives at home with  daughter.    Patient is retired.    Patient is widowed.    Patient has 1 child.    Patient has a 9th grade education.     Outpatient Medications Prior to Visit  Medication Sig Dispense Refill  . calcium carbonate (OS-CAL) 600 MG TABS Take 600 mg by mouth 2 (two) times daily with a meal.      . Cholecalciferol (VITAMIN D3) 5000 UNITS CAPS Take 500 Units by mouth daily.     . citalopram (CELEXA) 10 MG tablet TAKE 1 TABLET BY MOUTH EVERY DAY 90 tablet 0  . Cyanocobalamin (B-12 IJ) Inject as directed.    . diazepam (VALIUM) 5 MG tablet TAKE 1/2 TO 1 TABLET DAILY AS NEEDED FOR ANXIETY 30 tablet 1  . gabapentin (NEURONTIN) 800 MG tablet TAKE 1 TABLET BY MOUTH THREE TIMES A DAY 270 tablet 1  . metFORMIN (GLUCOPHAGE) 500 MG tablet Take 1 tablet (500 mg total) by mouth daily with breakfast. 90 tablet 3  . pantoprazole (PROTONIX) 40 MG tablet TAKE 1 TABLET BY MOUTH EVERY DAY 90 tablet 1  . traMADol (ULTRAM) 50 MG tablet TAKE 1 TABLET BY MOUTH EVERY 8 HOURS AS NEEDED FOR MODERATE PAIN 30 tablet 0  . pravastatin (PRAVACHOL) 40 MG tablet TAKE 1 TABLET BY MOUTH EVERYDAY AT BEDTIME 90 tablet 1   No facility-administered medications prior to visit.     Allergies  Allergen Reactions  . Tape Itching and Rash  . Actos [Pioglitazone Hydrochloride] Other (See Comments)    Unknown  . Buspar [Buspirone Hcl] Other (See Comments)    Unknown  . Lipitor [Atorvastatin Calcium] Other (See Comments)    "It made me hurt all over."  . Penicillins Rash    Review of Systems  Constitutional: Negative for fever and malaise/fatigue.  HENT: Negative for congestion.   Eyes: Negative for blurred vision.  Respiratory: Positive for shortness of breath.   Cardiovascular: Negative for chest pain, palpitations and leg swelling.  Gastrointestinal: Positive for constipation. Negative for abdominal pain, blood in stool and nausea.  Genitourinary: Negative for dysuria  and frequency.  Musculoskeletal: Negative for falls.    Skin: Negative for rash.  Neurological: Negative for dizziness, loss of consciousness and headaches.  Endo/Heme/Allergies: Negative for environmental allergies.  Psychiatric/Behavioral: Negative for depression. The patient is not nervous/anxious.        Objective:    Physical Exam Vitals signs and nursing note reviewed.  Constitutional:      General: She is not in acute distress.    Appearance: She is well-developed.  HENT:     Head: Normocephalic and atraumatic.     Nose: Nose normal.  Eyes:     General:        Right eye: No discharge.        Left eye: No discharge.  Neck:     Musculoskeletal: Normal range of motion and neck supple.  Cardiovascular:     Rate and Rhythm: Normal rate and regular rhythm.  Pulmonary:     Effort: Pulmonary effort is normal.     Breath sounds: Normal breath sounds.  Abdominal:     General: Bowel sounds are normal.     Palpations: Abdomen is soft.     Tenderness: There is no abdominal tenderness.  Skin:    General: Skin is warm and dry.  Neurological:     Mental Status: She is alert and oriented to person, place, and time.     BP 138/70 (BP Location: Left Arm, Patient Position: Sitting, Cuff Size: Normal)   Pulse 74   Temp 97.6 F (36.4 C) (Oral)   Resp 18   Ht 5\' 6"  (1.676 m)   Wt 142 lb 9.6 oz (64.7 kg)   SpO2 98%   BMI 23.02 kg/m  Wt Readings from Last 3 Encounters:  02/13/19 142 lb 9.6 oz (64.7 kg)  10/14/18 146 lb 12.8 oz (66.6 kg)  07/10/18 144 lb 6.4 oz (65.5 kg)     Lab Results  Component Value Date   WBC 4.5 02/13/2019   HGB 13.5 02/13/2019   HCT 41.8 02/13/2019   PLT 269.0 02/13/2019   GLUCOSE 114 (H) 02/13/2019   CHOL 271 (H) 02/13/2019   TRIG 186.0 (H) 02/13/2019   HDL 41.40 02/13/2019   LDLCALC 192 (H) 02/13/2019   ALT 7 02/13/2019   AST 14 02/13/2019   NA 141 02/13/2019   K 4.9 02/13/2019   CL 102 02/13/2019   CREATININE 1.31 (H) 02/13/2019   BUN 22 02/13/2019   CO2 32 02/13/2019   TSH 3.33  02/13/2019   HGBA1C 6.7 (H) 02/13/2019   MICROALBUR 1.7 02/13/2019    Lab Results  Component Value Date   TSH 3.33 02/13/2019   Lab Results  Component Value Date   WBC 4.5 02/13/2019   HGB 13.5 02/13/2019   HCT 41.8 02/13/2019   MCV 83.3 02/13/2019   PLT 269.0 02/13/2019   Lab Results  Component Value Date   NA 141 02/13/2019   K 4.9 02/13/2019   CO2 32 02/13/2019   GLUCOSE 114 (H) 02/13/2019   BUN 22 02/13/2019   CREATININE 1.31 (H) 02/13/2019   BILITOT 0.5 02/13/2019   ALKPHOS 68 02/13/2019   AST 14 02/13/2019   ALT 7 02/13/2019   PROT 6.8 02/13/2019   ALBUMIN 4.2 02/13/2019   CALCIUM 9.8 02/13/2019   GFR 38.07 (L) 02/13/2019   Lab Results  Component Value Date   CHOL 271 (H) 02/13/2019   Lab Results  Component Value Date   HDL 41.40 02/13/2019   Lab Results  Component Value Date  LDLCALC 192 (H) 02/13/2019   Lab Results  Component Value Date   TRIG 186.0 (H) 02/13/2019   Lab Results  Component Value Date   CHOLHDL 7 02/13/2019   Lab Results  Component Value Date   HGBA1C 6.7 (H) 02/13/2019       Assessment & Plan:   Problem List Items Addressed This Visit    Essential hypertension, benign    Well controlled, no changes to meds. Encouraged heart healthy diet such as the DASH diet and exercise as tolerated.       Relevant Orders   CBC (Completed)   Comprehensive metabolic panel (Completed)   TSH (Completed)   Tachycardia    RRR today      SOB (shortness of breath)    Imaging showed some COPD and she is offered an echo to further evaluate but declines. She feels it is not that bad will let us know.       Type 2 diabetes mellitus with peripheral neuropathy (HCC)    hgba1c acceptable, minimize simple carbs. Increase exercise as tolerated.       Relevant Orders   Hemoglobin A1c (Completed)   Urine Microalbumin w/creat. ratio (Completed)   Chronic renal insufficiency    Monitor and hydrate      Hyperlipidemia, mixed    Tolerating  statin, encouraged heart healthy diet, avoid trans fats, minimize simple carbs and saturated fats. Increase exercise as tolerated      Relevant Orders   Lipid panel (Completed)   Osteopenia    She declines further bone density scans. Encouraged to get adequate exercise, calcium and vitamin d intake      Vitamin B 12 deficiency    Supplement and monitor      Relevant Orders   Vitamin B12 (Completed)   Chronic pain syndrome    Neck pain is the worst she manages well most days, sleeping well. No changes at this time      Constipation    Intermittent and can go 3 days with nothing then can go multiple times a day. Encouraged increased hydration and fiber in diet. Daily probiotics. If bowels not moving can use MOM 2 tbls po in 4 oz of warm prune juice by mouth every 2-3 days. If no results then repeat in 4 hours with  Dulcolax suppository pr, may repeat again in 4 more hours as needed. Seek care if symptoms worsen. Consider daily Miralax and/or Dulcolax if symptoms persist.          I have discontinued Carter M. Tiano's pravastatin. I am also having her maintain her calcium carbonate, Vitamin D3, Cyanocobalamin (B-12 IJ), gabapentin, metFORMIN, traMADol, pantoprazole, diazepam, and citalopram.  No orders of the defined types were placed in this encounter.    Penni Homans, MD

## 2019-02-18 MED ORDER — PRAVASTATIN SODIUM 20 MG PO TABS: 20.0000 mg | ORAL_TABLET | Freq: Every day | ORAL | 3 refills | Status: DC

## 2019-02-18 NOTE — Addendum Note (Signed)
Addended by: Magdalene Molly A on: 02/18/2019 11:18 AM   Modules accepted: Orders

## 2019-03-18 ENCOUNTER — Ambulatory Visit: Payer: Medicare Other

## 2019-04-20 ENCOUNTER — Other Ambulatory Visit: Payer: Self-pay | Admitting: Family Medicine

## 2019-04-28 ENCOUNTER — Other Ambulatory Visit: Payer: Self-pay | Admitting: Family Medicine

## 2019-04-28 DIAGNOSIS — G894 Chronic pain syndrome: Secondary | ICD-10-CM

## 2019-04-28 NOTE — Telephone Encounter (Signed)
Requesting:tramadol Contract:yes UDS:n/a Last OV:02/13/19 Next OV:05/15/19 Last Refill:12/25/18  #[id number] Database:   Please advise

## 2019-04-29 DIAGNOSIS — E119 Type 2 diabetes mellitus without complications: Secondary | ICD-10-CM | POA: Diagnosis not present

## 2019-05-05 ENCOUNTER — Telehealth: Payer: Self-pay | Admitting: *Deleted

## 2019-05-05 NOTE — Telephone Encounter (Signed)
Received Physician Orders for Diabetes supplies from [facility]; forwarded to provider/SLS 05/05

## 2019-05-15 ENCOUNTER — Ambulatory Visit: Payer: Medicare Other | Admitting: Family Medicine

## 2019-05-26 ENCOUNTER — Other Ambulatory Visit: Payer: Self-pay | Admitting: Family Medicine

## 2019-05-26 DIAGNOSIS — F419 Anxiety disorder, unspecified: Secondary | ICD-10-CM

## 2019-05-27 NOTE — Telephone Encounter (Signed)
Requesting:valium Contract:yes FPU:[id number] risk next screen 09/30/18 Last OV:02/13/19 Next OV:06/25/19 Last Refill:01/28/19   #[id number] Database:   Please advise

## 2019-06-24 ENCOUNTER — Telehealth: Payer: Self-pay | Admitting: *Deleted

## 2019-06-24 ENCOUNTER — Ambulatory Visit: Payer: Self-pay | Admitting: *Deleted

## 2019-06-24 NOTE — Telephone Encounter (Signed)
Agent gave me a message that Michae Kava, a home health nurse called in this message regarding this pt Per the [place] is having chest pain and tenderness in her chest area.  She is also short of breath when walking small distances.  [id number] number [id number].  I called Dr. Frederik Pear office and passed this information to Dennis.   Rod Holler is going to call Jaykia back.

## 2019-06-24 NOTE — Telephone Encounter (Signed)
I opened this chart twice by mistake.   See documentation in other open chart.

## 2019-06-25 ENCOUNTER — Ambulatory Visit (INDEPENDENT_AMBULATORY_CARE_PROVIDER_SITE_OTHER): Payer: Medicare Other | Admitting: Family Medicine

## 2019-06-25 ENCOUNTER — Other Ambulatory Visit: Payer: Self-pay

## 2019-06-25 DIAGNOSIS — E538 Deficiency of other specified B group vitamins: Secondary | ICD-10-CM | POA: Diagnosis not present

## 2019-06-25 DIAGNOSIS — E782 Mixed hyperlipidemia: Secondary | ICD-10-CM

## 2019-06-25 DIAGNOSIS — I10 Essential (primary) hypertension: Secondary | ICD-10-CM

## 2019-06-25 DIAGNOSIS — F411 Generalized anxiety disorder: Secondary | ICD-10-CM

## 2019-06-25 DIAGNOSIS — E1142 Type 2 diabetes mellitus with diabetic polyneuropathy: Secondary | ICD-10-CM | POA: Diagnosis not present

## 2019-06-25 DIAGNOSIS — N189 Chronic kidney disease, unspecified: Secondary | ICD-10-CM | POA: Diagnosis not present

## 2019-06-25 MED ORDER — CITALOPRAM HYDROBROMIDE 10 MG PO TABS: 20.0000 mg | ORAL_TABLET | Freq: Every day | ORAL | 0 refills | Status: DC

## 2019-06-28 NOTE — Progress Notes (Signed)
Virtual Visit via phone Note  I connected with Dana Burns on 06/25/19 at  1:40 PM EDT by a phone enabled telemedicine application and verified that I am speaking with the correct person using two identifiers.  Location: Patient: home Provider: office   I discussed the limitations of evaluation and management by telemedicine and the availability of in person appointments. The patient expressed understanding and agreed to proceed. [name] CMA was unable to get patient set upon video visit so visit performed via telephone.     Subjective:    Patient ID: Dana Burns, female    DOB: 08-14-1926, 85 y.o.   MRN: [id number]  No chief complaint on file.   HPI Patient is in today for follow up on chronic medical concerns including hypertension, hyperlipidemia, back pain and more. She notes having several family members dying lately so she is very das  Past Medical History:  Diagnosis Date  . Allergy    sneeze, rhinorrhea in am  . Chicken pox as a child  . Chronic pain syndrome 01/23/2015  . Colon cancer ([place])   . Complication of anesthesia    agitated when waking up, wakes up shaking, "like  I'm going into shock"  . Depression    husband died  3 months ago, pt. admits depression  currently   . Diabetes mellitus    6 years  . DM (diabetes mellitus) type 2, uncontrolled, with ketoacidosis ([place]) 03/17/2011  . Fibromyalgia   . GERD (gastroesophageal reflux disease)   . H/O echocardiogram    done /w [place] in 2011, saw Dr. Percival Spanish for tachycardia   . Headache(784.0)    occasional - "bad headaches"  . Hyperlipidemia    6 years  . Hypertension    15 years, reports she has a rapid heartrate   . Measles as a child  . Osteoarthritis    in hands & all over, feet  . Pain in finger of right hand 05/15/2017  . Pain of right shoulder region 07/07/2014  . Peripheral neuropathy   . Shortness of breath   . Type 2 diabetes mellitus with peripheral neuropathy ([place]) 03/17/2011    Past  Surgical History:  Procedure Laterality Date  . BREAST EXCISIONAL BIOPSY Right 2013   benign  . BREAST SURGERY  2013   right- benign  . CATARACT EXTRACTION     /w IOL- bilateral   . COLON SURGERY     For resection of cancer  . Nodule removed     From throat    Family History  Problem Relation Age of Onset  . Cancer Mother        Brain  . Cancer Father        Colorectal  . Cancer Sister        breast  . Other Brother        pacemaker  . Cancer Brother        pancreatic cancer  . Arthritis Sister   . Emphysema Brother   . Cancer Brother   . COPD Brother   . Neuropathy Daughter   . Fibromyalgia Daughter   . Cancer Brother        metastatic colon cancer  . Heart disease Neg Hx        Early    Social History   Socioeconomic History  . Marital status: Widowed    Spouse name: Not on file  . Number of children: 1  . Years of education: 52  . Highest  education level: Not on file  Occupational History  . Occupation: Retired  Scientific laboratory technician  . Financial resource strain: Not on file  . Food insecurity    Worry: Not on file    Inability: Not on file  . Transportation needs    Medical: Not on file    Non-medical: Not on file  Tobacco Use  . Smoking status: Never Smoker  . Smokeless tobacco: Never Used  Substance and Sexual Activity  . Alcohol use: No  . Drug use: No  . Sexual activity: Never    Comment: lives with daughter. no dietary restrictions  Lifestyle  . Physical activity    Days per week: Not on file    Minutes per session: Not on file  . Stress: Not on file  Relationships  . Social Herbalist on phone: Not on file    Gets together: Not on file    Attends religious service: Not on file    Active member of club or organization: Not on file    Attends meetings of clubs or organizations: Not on file    Relationship status: Not on file  . Intimate partner violence    Fear of current or ex partner: Not on file    Emotionally abused: Not on  file    Physically abused: Not on file    Forced sexual activity: Not on file  Other Topics Concern  . Not on file  Social History Narrative   Patient lives at home with daughter.    Patient is retired.    Patient is widowed.    Patient has 1 child.    Patient has a 9th grade education.     Outpatient Medications Prior to Visit  Medication Sig Dispense Refill  . calcium carbonate (OS-CAL) 600 MG TABS Take 600 mg by mouth 2 (two) times daily with a meal.      . Cholecalciferol (VITAMIN D3) 5000 UNITS CAPS Take 500 Units by mouth daily.     . Cyanocobalamin (B-12 IJ) Inject as directed.    . diazepam (VALIUM) 5 MG tablet TAKE 1/2 TO 1 TABLET DAILY AS NEEDED FOR ANXIETY 30 tablet 1  . gabapentin (NEURONTIN) 800 MG tablet TAKE 1 TABLET BY MOUTH THREE TIMES A DAY 270 tablet 1  . metFORMIN (GLUCOPHAGE) 500 MG tablet Take 1 tablet (500 mg total) by mouth daily with breakfast. 90 tablet 3  . pantoprazole (PROTONIX) 40 MG tablet TAKE 1 TABLET BY MOUTH EVERY DAY 90 tablet 1  . pravastatin (PRAVACHOL) 20 MG tablet Take 1 tablet (20 mg total) by mouth daily. 90 tablet 3  . traMADol (ULTRAM) 50 MG tablet TAKE 1 TABLET BY MOUTH EVERY 8 HOURS AS NEEDED FOR MODERATE PAIN 30 tablet 0  . citalopram (CELEXA) 10 MG tablet TAKE 1 TABLET BY MOUTH EVERY DAY 90 tablet 0   No facility-administered medications prior to visit.     Allergies  Allergen Reactions  . Tape Itching and Rash  . Actos [Pioglitazone Hydrochloride] Other (See Comments)    Unknown  . Buspar [Buspirone Hcl] Other (See Comments)    Unknown  . Lipitor [Atorvastatin Calcium] Other (See Comments)    "It made me hurt all over."  . Penicillins Rash    Review of Systems  Constitutional: Negative for fever and malaise/fatigue.  HENT: Negative for congestion.   Eyes: Negative for blurred vision.  Respiratory: Negative for shortness of breath.   Cardiovascular: Negative for chest pain, palpitations and leg  swelling.  Gastrointestinal:  Negative for abdominal pain, blood in stool and nausea.  Genitourinary: Negative for dysuria and frequency.  Musculoskeletal: Negative for falls.  Skin: Negative for rash.  Neurological: Negative for dizziness, loss of consciousness and headaches.  Endo/Heme/Allergies: Negative for environmental allergies.  Psychiatric/Behavioral: Negative for depression. The patient is nervous/anxious.        Objective:    Physical Exam unable to obtain via phone.   There were no vitals taken for this visit. Wt Readings from Last 3 Encounters:  02/13/19 142 lb 9.6 oz (64.7 kg)  10/14/18 146 lb 12.8 oz (66.6 kg)  07/10/18 144 lb 6.4 oz (65.5 kg)    Diabetic Foot Exam - Simple   No data filed     Lab Results  Component Value Date   WBC 4.5 02/13/2019   HGB 13.5 02/13/2019   HCT 41.8 02/13/2019   PLT 269.0 02/13/2019   GLUCOSE 114 (H) 02/13/2019   CHOL 271 (H) 02/13/2019   TRIG 186.0 (H) 02/13/2019   HDL 41.40 02/13/2019   LDLCALC 192 (H) 02/13/2019   ALT 7 02/13/2019   AST 14 02/13/2019   NA 141 02/13/2019   K 4.9 02/13/2019   CL 102 02/13/2019   CREATININE 1.31 (H) 02/13/2019   BUN 22 02/13/2019   CO2 32 02/13/2019   TSH 3.33 02/13/2019   HGBA1C 6.7 (H) 02/13/2019   MICROALBUR 1.7 02/13/2019    Lab Results  Component Value Date   TSH 3.33 02/13/2019   Lab Results  Component Value Date   WBC 4.5 02/13/2019   HGB 13.5 02/13/2019   HCT 41.8 02/13/2019   MCV 83.3 02/13/2019   PLT 269.0 02/13/2019   Lab Results  Component Value Date   NA 141 02/13/2019   K 4.9 02/13/2019   CO2 32 02/13/2019   GLUCOSE 114 (H) 02/13/2019   BUN 22 02/13/2019   CREATININE 1.31 (H) 02/13/2019   BILITOT 0.5 02/13/2019   ALKPHOS 68 02/13/2019   AST 14 02/13/2019   ALT 7 02/13/2019   PROT 6.8 02/13/2019   ALBUMIN 4.2 02/13/2019   CALCIUM 9.8 02/13/2019   GFR 38.07 (L) 02/13/2019   Lab Results  Component Value Date   CHOL 271 (H) 02/13/2019   Lab Results  Component Value Date    HDL 41.40 02/13/2019   Lab Results  Component Value Date   LDLCALC 192 (H) 02/13/2019   Lab Results  Component Value Date   TRIG 186.0 (H) 02/13/2019   Lab Results  Component Value Date   CHOLHDL 7 02/13/2019   Lab Results  Component Value Date   HGBA1C 6.7 (H) 02/13/2019       Assessment & Plan:   Problem List Items Addressed This Visit    Essential hypertension, benign    Well controlled, no changes to meds. Encouraged heart healthy diet such as the DASH diet and exercise as tolerated.       Type 2 diabetes mellitus with peripheral neuropathy (HCC)    hgba1c acceptable, minimize simple carbs. Increase exercise as tolerated. Continue current meds      Relevant Medications   citalopram (CELEXA) 10 MG tablet   Chronic renal insufficiency    Hydrate and monitor      Hyperlipidemia, mixed    Encouraged heart healthy diet, increase exercise, avoid trans fats, consider a krill oil cap daily      Anxiety state    Very stressed by loss of several family members, her brother in law died  on April 1. Will increase her Citalopram to 20 mg daily      Relevant Medications   citalopram (CELEXA) 10 MG tablet   Vitamin B 12 deficiency    Has been unable to get in for shots. She will start SL tabs and we will monitor         I have changed Francene Boyers. Jallow's citalopram. I am also having her maintain her calcium carbonate, Vitamin D3, Cyanocobalamin (B-12 IJ), gabapentin, metFORMIN, pantoprazole, pravastatin, traMADol, and diazepam.  Meds ordered this encounter  Medications  . citalopram (CELEXA) 10 MG tablet    Sig: Take 2 tablets (20 mg total) by mouth daily.    Dispense:  90 tablet    Refill:  0     I discussed the assessment and treatment plan with the patient. The patient was provided an opportunity to ask questions and all were answered. The patient agreed with the plan and demonstrated an understanding of the instructions.   The patient was advised to call back or  seek an in-person evaluation if the symptoms worsen or if the condition fails to improve as anticipated.  I provided 25 minutes of non-face-to-face time during this encounter.   Penni Homans, MD

## 2019-06-28 NOTE — Assessment & Plan Note (Signed)
Encouraged heart healthy diet, increase exercise, avoid trans fats, consider a krill oil cap daily 

## 2019-06-28 NOTE — Assessment & Plan Note (Signed)
Has been unable to get in for shots. She will start SL tabs and we will monitor

## 2019-06-28 NOTE — Assessment & Plan Note (Signed)
Hydrate and monitor 

## 2019-06-28 NOTE — Assessment & Plan Note (Signed)
Very stressed by loss of several family members, her brother in law died on 04/16/2023. Will increase her Citalopram to 20 mg daily

## 2019-06-28 NOTE — Assessment & Plan Note (Signed)
Well controlled, no changes to meds. Encouraged heart healthy diet such as the DASH diet and exercise as tolerated.  °

## 2019-06-28 NOTE — Assessment & Plan Note (Signed)
hgba1c acceptable, minimize simple carbs. Increase exercise as tolerated. Continue current meds 

## 2019-07-13 ENCOUNTER — Other Ambulatory Visit: Payer: Self-pay | Admitting: Family Medicine

## 2019-07-14 ENCOUNTER — Other Ambulatory Visit: Payer: Self-pay | Admitting: Family Medicine

## 2019-07-14 DIAGNOSIS — G894 Chronic pain syndrome: Secondary | ICD-10-CM

## 2019-07-14 NOTE — Telephone Encounter (Signed)
Requesting:tramadol Contract: yes UDS:low risk  Last OV:06/25/19 Next OV:n/a Last Refill:04/28/19  #30-0rf Database:   Please advise

## 2019-07-30 ENCOUNTER — Other Ambulatory Visit: Payer: Self-pay | Admitting: Family Medicine

## 2019-08-19 DIAGNOSIS — E119 Type 2 diabetes mellitus without complications: Secondary | ICD-10-CM | POA: Diagnosis not present

## 2019-09-21 ENCOUNTER — Other Ambulatory Visit: Payer: Self-pay | Admitting: Family Medicine

## 2019-09-21 DIAGNOSIS — F419 Anxiety disorder, unspecified: Secondary | ICD-10-CM

## 2019-09-22 NOTE — Telephone Encounter (Signed)
Requesting:valium Contract:yes UDS:n/a Last OV:06/25/19 Next OV:09/25/19 Last Refill:05/27/19  #[id number] Database:   Please advise

## 2019-09-25 ENCOUNTER — Ambulatory Visit (INDEPENDENT_AMBULATORY_CARE_PROVIDER_SITE_OTHER): Payer: Medicare Other | Admitting: Family Medicine

## 2019-09-25 ENCOUNTER — Other Ambulatory Visit: Payer: Self-pay

## 2019-09-25 DIAGNOSIS — I10 Essential (primary) hypertension: Secondary | ICD-10-CM

## 2019-09-25 DIAGNOSIS — F411 Generalized anxiety disorder: Secondary | ICD-10-CM

## 2019-09-25 DIAGNOSIS — E538 Deficiency of other specified B group vitamins: Secondary | ICD-10-CM

## 2019-09-25 DIAGNOSIS — N189 Chronic kidney disease, unspecified: Secondary | ICD-10-CM

## 2019-09-25 DIAGNOSIS — E1142 Type 2 diabetes mellitus with diabetic polyneuropathy: Secondary | ICD-10-CM

## 2019-09-25 DIAGNOSIS — N289 Disorder of kidney and ureter, unspecified: Secondary | ICD-10-CM

## 2019-09-25 DIAGNOSIS — E782 Mixed hyperlipidemia: Secondary | ICD-10-CM

## 2019-09-25 DIAGNOSIS — E1149 Type 2 diabetes mellitus with other diabetic neurological complication: Secondary | ICD-10-CM

## 2019-09-27 NOTE — Progress Notes (Addendum)
Virtual Visit via phone Note  I connected with Dana Burns on 09/25/19 at  3:20 PM EDT by a phone enabled telemedicine application and verified that I am speaking with the correct person using two identifiers. [name] CMA was able to get the patient set up on a phone visit after being unable to set up video visit Location: Patient: home Provider: home   I discussed the limitations of evaluation and management by telemedicine and the availability of in person appointments. The patient expressed understanding and agreed to proceed.    Subjective:    Patient ID: Dana Burns, female    DOB: 1926/01/30, 85 y.o.   MRN: [id number]  No chief complaint on file.   HPI Patient is in today for follow up on chronic medical concerns including diabetes, hypertension, hyperlipidemia and more. She continues to tolerate her medications such as Pravachol and Metformin without significant side effects. She is maintaining quarantine. No recent febrile illness or hospitalizations. No polyuria or polydipsia. Denies CP/palp/SOB/HA/congestion/fevers/GI or GU c/o. Taking meds as prescribed  Past Medical History:  Diagnosis Date  . Allergy    sneeze, rhinorrhea in am  . Chicken pox as a child  . Chronic pain syndrome 01/23/2015  . Colon cancer ([place])   . Complication of anesthesia    agitated when waking up, wakes up shaking, "like  I'm going into shock"  . Depression    husband died  3 months ago, pt. admits depression  currently   . Diabetes mellitus    6 years  . DM (diabetes mellitus) type 2, uncontrolled, with ketoacidosis ([place]) 03/17/2011  . Fibromyalgia   . GERD (gastroesophageal reflux disease)   . H/O echocardiogram    done /w [place] in 2011, saw Dr. Percival Spanish for tachycardia   . Headache(784.0)    occasional - "bad headaches"  . Hyperlipidemia    6 years  . Hypertension    15 years, reports she has a rapid heartrate   . Measles as a child  . Osteoarthritis    in hands & all over,  feet  . Pain in finger of right hand 05/15/2017  . Pain of right shoulder region 07/07/2014  . Peripheral neuropathy   . Shortness of breath   . Type 2 diabetes mellitus with peripheral neuropathy ([place]) 03/17/2011    Past Surgical History:  Procedure Laterality Date  . BREAST EXCISIONAL BIOPSY Right 2013   benign  . BREAST SURGERY  2013   right- benign  . CATARACT EXTRACTION     /w IOL- bilateral   . COLON SURGERY     For resection of cancer  . Nodule removed     From throat    Family History  Problem Relation Age of Onset  . Cancer Mother        Brain  . Cancer Father        Colorectal  . Cancer Sister        breast  . Other Brother        pacemaker  . Cancer Brother        pancreatic cancer  . Arthritis Sister   . Emphysema Brother   . Cancer Brother   . COPD Brother   . Neuropathy Daughter   . Fibromyalgia Daughter   . Cancer Brother        metastatic colon cancer  . Heart disease Neg Hx        Early    Social History   Socioeconomic History  .  Marital status: Widowed    Spouse name: Not on file  . Number of children: 1  . Years of education: 96  . Highest education level: Not on file  Occupational History  . Occupation: Retired  Scientific laboratory technician  . Financial resource strain: Not on file  . Food insecurity    Worry: Not on file    Inability: Not on file  . Transportation needs    Medical: Not on file    Non-medical: Not on file  Tobacco Use  . Smoking status: Never Smoker  . Smokeless tobacco: Never Used  Substance and Sexual Activity  . Alcohol use: No  . Drug use: No  . Sexual activity: Never    Comment: lives with daughter. no dietary restrictions  Lifestyle  . Physical activity    Days per week: Not on file    Minutes per session: Not on file  . Stress: Not on file  Relationships  . Social Herbalist on phone: Not on file    Gets together: Not on file    Attends religious service: Not on file    Active member of club or  organization: Not on file    Attends meetings of clubs or organizations: Not on file    Relationship status: Not on file  . Intimate partner violence    Fear of current or ex partner: Not on file    Emotionally abused: Not on file    Physically abused: Not on file    Forced sexual activity: Not on file  Other Topics Concern  . Not on file  Social History Narrative   Patient lives at home with daughter.    Patient is retired.    Patient is widowed.    Patient has 1 child.    Patient has a 9th grade education.     Outpatient Medications Prior to Visit  Medication Sig Dispense Refill  . calcium carbonate (OS-CAL) 600 MG TABS Take 600 mg by mouth 2 (two) times daily with a meal.      . Cholecalciferol (VITAMIN D3) 5000 UNITS CAPS Take 500 Units by mouth daily.     . citalopram (CELEXA) 10 MG tablet TAKE 1 TABLET BY MOUTH EVERY DAY 90 tablet 0  . Cyanocobalamin (B-12 IJ) Inject as directed.    . diazepam (VALIUM) 5 MG tablet TAKE 1/2 TO 1 TABLET DAILY AS NEEDED FOR ANXIETY 30 tablet 1  . gabapentin (NEURONTIN) 800 MG tablet TAKE 1 TABLET BY MOUTH THREE TIMES A DAY 270 tablet 1  . metFORMIN (GLUCOPHAGE) 500 MG tablet Take 1 tablet (500 mg total) by mouth daily with breakfast. 90 tablet 3  . pantoprazole (PROTONIX) 40 MG tablet TAKE 1 TABLET BY MOUTH EVERY DAY 90 tablet 1  . pravastatin (PRAVACHOL) 20 MG tablet Take 1 tablet (20 mg total) by mouth daily. 90 tablet 3  . traMADol (ULTRAM) 50 MG tablet TAKE 1 TABLET BY MOUTH EVERY 8 HOURS AS NEEDED FOR MODERATE PAIN 30 tablet 0   No facility-administered medications prior to visit.     Allergies  Allergen Reactions  . Tape Itching and Rash  . Actos [Pioglitazone Hydrochloride] Other (See Comments)    Unknown  . Buspar [Buspirone Hcl] Other (See Comments)    Unknown  . Lipitor [Atorvastatin Calcium] Other (See Comments)    "It made me hurt all over."  . Penicillins Rash    Review of Systems  Constitutional: Positive for  malaise/fatigue. Negative for fever.  HENT: Negative for congestion.   Eyes: Negative for blurred vision.  Respiratory: Negative for shortness of breath.   Cardiovascular: Negative for chest pain, palpitations and leg swelling.  Gastrointestinal: Negative for abdominal pain, blood in stool and nausea.  Genitourinary: Negative for dysuria and frequency.  Musculoskeletal: Negative for falls.  Skin: Negative for rash.  Neurological: Negative for dizziness, loss of consciousness and headaches.  Endo/Heme/Allergies: Negative for environmental allergies.  Psychiatric/Behavioral: Negative for depression. The patient is not nervous/anxious.        Objective:    Physical Exam unable to obtain via phone visit  There were no vitals taken for this visit. Wt Readings from Last 3 Encounters:  02/13/19 142 lb 9.6 oz (64.7 kg)  10/14/18 146 lb 12.8 oz (66.6 kg)  07/10/18 144 lb 6.4 oz (65.5 kg)    Diabetic Foot Exam - Simple   No data filed     Lab Results  Component Value Date   WBC 4.5 02/13/2019   HGB 13.5 02/13/2019   HCT 41.8 02/13/2019   PLT 269.0 02/13/2019   GLUCOSE 114 (H) 02/13/2019   CHOL 271 (H) 02/13/2019   TRIG 186.0 (H) 02/13/2019   HDL 41.40 02/13/2019   LDLCALC 192 (H) 02/13/2019   ALT 7 02/13/2019   AST 14 02/13/2019   NA 141 02/13/2019   K 4.9 02/13/2019   CL 102 02/13/2019   CREATININE 1.31 (H) 02/13/2019   BUN 22 02/13/2019   CO2 32 02/13/2019   TSH 3.33 02/13/2019   HGBA1C 6.7 (H) 02/13/2019   MICROALBUR 1.7 02/13/2019    Lab Results  Component Value Date   TSH 3.33 02/13/2019   Lab Results  Component Value Date   WBC 4.5 02/13/2019   HGB 13.5 02/13/2019   HCT 41.8 02/13/2019   MCV 83.3 02/13/2019   PLT 269.0 02/13/2019   Lab Results  Component Value Date   NA 141 02/13/2019   K 4.9 02/13/2019   CO2 32 02/13/2019   GLUCOSE 114 (H) 02/13/2019   BUN 22 02/13/2019   CREATININE 1.31 (H) 02/13/2019   BILITOT 0.5 02/13/2019   ALKPHOS 68  02/13/2019   AST 14 02/13/2019   ALT 7 02/13/2019   PROT 6.8 02/13/2019   ALBUMIN 4.2 02/13/2019   CALCIUM 9.8 02/13/2019   GFR 38.07 (L) 02/13/2019   Lab Results  Component Value Date   CHOL 271 (H) 02/13/2019   Lab Results  Component Value Date   HDL 41.40 02/13/2019   Lab Results  Component Value Date   LDLCALC 192 (H) 02/13/2019   Lab Results  Component Value Date   TRIG 186.0 (H) 02/13/2019   Lab Results  Component Value Date   CHOLHDL 7 02/13/2019   Lab Results  Component Value Date   HGBA1C 6.7 (H) 02/13/2019       Assessment & Plan:   Problem List Items Addressed This Visit    Essential hypertension, benign    Encouraged to monitor BP weekly and report any concerns. no changes to meds. Encouraged heart healthy diet such as the DASH diet and exercise as tolerated.       Relevant Orders   CBC   Comprehensive metabolic panel   TSH   Type 2 diabetes mellitus with peripheral neuropathy (HCC) - Primary   Relevant Orders   Hemoglobin A1c   Diabetic neuropathy (HCC)    No worsening symptoms. hgba1c acceptable, minimize simple carbs. Increase exercise as tolerated. Continue current meds  Chronic renal insufficiency    Hydrate and monitor      Hyperlipidemia, mixed    Encouraged heart healthy diet, increase exercise, avoid trans fats, consider a krill oil cap daily      Relevant Orders   Lipid panel   Anxiety state    Maintaining quarantine well and managing her anxiety fairly well.       Vitamin B 12 deficiency   Relevant Orders   VITAMIN D 25 Hydroxy (Vit-D Deficiency, Fractures)      I am having Maicie M. Cappuccio maintain her calcium carbonate, Vitamin D3, Cyanocobalamin (B-12 IJ), metFORMIN, pravastatin, gabapentin, traMADol, citalopram, pantoprazole, and diazepam.  No orders of the defined types were placed in this encounter.    I discussed the assessment and treatment plan with the patient. The patient was provided an opportunity to  ask questions and all were answered. The patient agreed with the plan and demonstrated an understanding of the instructions.   The patient was advised to call back or seek an in-person evaluation if the symptoms worsen or if the condition fails to improve as anticipated.  I provided 25 minutes of non-face-to-face time during this encounter.   Penni Homans, MD

## 2019-09-27 NOTE — Assessment & Plan Note (Signed)
Hydrate and monitor 

## 2019-09-27 NOTE — Assessment & Plan Note (Signed)
Encouraged heart healthy diet, increase exercise, avoid trans fats, consider a krill oil cap daily 

## 2019-09-27 NOTE — Assessment & Plan Note (Signed)
No worsening symptoms. hgba1c acceptable, minimize simple carbs. Increase exercise as tolerated. Continue current meds

## 2019-09-27 NOTE — Assessment & Plan Note (Signed)
Encouraged to monitor BP weekly and report any concerns. no changes to meds. Encouraged heart healthy diet such as the DASH diet and exercise as tolerated.

## 2019-09-27 NOTE — Assessment & Plan Note (Signed)
Maintaining quarantine well and managing her anxiety fairly well.

## 2019-10-06 ENCOUNTER — Other Ambulatory Visit (INDEPENDENT_AMBULATORY_CARE_PROVIDER_SITE_OTHER): Payer: Medicare Other

## 2019-10-06 ENCOUNTER — Other Ambulatory Visit: Payer: Self-pay

## 2019-10-06 DIAGNOSIS — E538 Deficiency of other specified B group vitamins: Secondary | ICD-10-CM

## 2019-10-06 DIAGNOSIS — E782 Mixed hyperlipidemia: Secondary | ICD-10-CM

## 2019-10-06 DIAGNOSIS — E1142 Type 2 diabetes mellitus with diabetic polyneuropathy: Secondary | ICD-10-CM

## 2019-10-06 DIAGNOSIS — I10 Essential (primary) hypertension: Secondary | ICD-10-CM

## 2019-10-06 LAB — LIPID PANEL
Cholesterol: 200 mg/dL (ref 0–200)
HDL: 40.9 mg/dL (ref >39.00)
NonHDL: 158.66
Total CHOL/HDL Ratio: 5
Triglycerides: 205 mg/dL — ABNORMAL HIGH (ref 0.0–149.0)
VLDL: 41 mg/dL — ABNORMAL HIGH (ref 0.0–40.0)

## 2019-10-06 LAB — COMPREHENSIVE METABOLIC PANEL
ALT: 7 U/L (ref 0–35)
AST: 15 U/L (ref 0–37)
Albumin: 4.2 g/dL (ref 3.5–5.2)
Alkaline Phosphatase: 65 U/L (ref 39–117)
BUN: 22 mg/dL (ref 6–23)
CO2: 32 mEq/L (ref 19–32)
Calcium: 9.7 mg/dL (ref 8.4–10.5)
Chloride: 103 mEq/L (ref 96–112)
Creatinine, Ser: 1.43 mg/dL — ABNORMAL HIGH (ref 0.40–1.20)
GFR: 34.36 mL/min — ABNORMAL LOW (ref >60.00)
Glucose, Bld: 89 mg/dL (ref 70–99)
Potassium: 5.1 mEq/L (ref 3.5–5.1)
Sodium: 141 mEq/L (ref 135–145)
Total Bilirubin: 0.3 mg/dL (ref 0.2–1.2)
Total Protein: 6.8 g/dL (ref 6.0–8.3)

## 2019-10-06 LAB — CBC
HCT: 39.6 % (ref 36.0–46.0)
Hemoglobin: 12.7 g/dL (ref 12.0–15.0)
MCHC: 32 g/dL (ref 30.0–36.0)
MCV: 84.7 fl (ref 78.0–100.0)
Platelets: 245 10*3/uL (ref 150.0–400.0)
RBC: 4.68 Mil/uL (ref 3.87–5.11)
RDW: 16.2 % — ABNORMAL HIGH (ref 11.5–15.5)
WBC: 5.9 10*3/uL (ref 4.0–10.5)

## 2019-10-06 LAB — TSH: TSH: 2.24 u[IU]/mL (ref 0.35–4.50)

## 2019-10-06 LAB — HEMOGLOBIN A1C: Hgb A1c MFr Bld: 6.9 % — ABNORMAL HIGH (ref 4.6–6.5)

## 2019-10-06 LAB — LDL CHOLESTEROL, DIRECT: Direct LDL: 122 mg/dL

## 2019-10-06 LAB — VITAMIN D 25 HYDROXY (VIT D DEFICIENCY, FRACTURES): VITD: 74.64 ng/mL (ref 30.00–100.00)

## 2019-11-19 ENCOUNTER — Other Ambulatory Visit: Payer: Self-pay | Admitting: Family Medicine

## 2019-11-19 DIAGNOSIS — G894 Chronic pain syndrome: Secondary | ICD-10-CM

## 2019-11-20 NOTE — Telephone Encounter (Signed)
Requesting:tramadol  Contract:yes [id number]  Last OV:09/25/19 Next OV:04/04/20 Last Refill:07/14/19  #30-0rf Database:   Please advise

## 2019-12-15 DIAGNOSIS — E119 Type 2 diabetes mellitus without complications: Secondary | ICD-10-CM | POA: Diagnosis not present

## 2019-12-21 ENCOUNTER — Other Ambulatory Visit: Payer: Self-pay | Admitting: Family Medicine

## 2019-12-23 ENCOUNTER — Other Ambulatory Visit: Payer: Self-pay | Admitting: Family Medicine

## 2020-01-10 ENCOUNTER — Other Ambulatory Visit: Payer: Self-pay | Admitting: Family Medicine

## 2020-01-12 ENCOUNTER — Other Ambulatory Visit: Payer: Self-pay | Admitting: Family Medicine

## 2020-01-20 ENCOUNTER — Other Ambulatory Visit: Payer: Self-pay | Admitting: Family Medicine

## 2020-01-20 DIAGNOSIS — F419 Anxiety disorder, unspecified: Secondary | ICD-10-CM

## 2020-01-21 NOTE — Telephone Encounter (Signed)
Requesting:valium  Contract:yes UDS: n/a Last OV:09/25/19 Next OV:04/04/20 Last Refill:09/22/19  #30-1rf Database:   Please advise

## 2020-02-05 ENCOUNTER — Other Ambulatory Visit: Payer: Self-pay | Admitting: Family Medicine

## 2020-02-15 ENCOUNTER — Other Ambulatory Visit: Payer: Self-pay | Admitting: Family Medicine

## 2020-02-15 ENCOUNTER — Ambulatory Visit (INDEPENDENT_AMBULATORY_CARE_PROVIDER_SITE_OTHER): Payer: Medicare Other | Admitting: Family Medicine

## 2020-02-15 ENCOUNTER — Other Ambulatory Visit: Payer: Self-pay

## 2020-02-15 DIAGNOSIS — E1142 Type 2 diabetes mellitus with diabetic polyneuropathy: Secondary | ICD-10-CM

## 2020-02-15 DIAGNOSIS — N189 Chronic kidney disease, unspecified: Secondary | ICD-10-CM | POA: Diagnosis not present

## 2020-02-15 DIAGNOSIS — M25569 Pain in unspecified knee: Secondary | ICD-10-CM

## 2020-02-15 DIAGNOSIS — I10 Essential (primary) hypertension: Secondary | ICD-10-CM | POA: Diagnosis not present

## 2020-02-15 MED ORDER — METHYLPREDNISOLONE 4 MG PO TABS: ORAL_TABLET | ORAL | 0 refills | Status: DC

## 2020-02-15 MED ORDER — TIZANIDINE HCL 2 MG PO TABS: 1.0000 mg | ORAL_TABLET | Freq: Two times a day (BID) | ORAL | 0 refills | Status: DC | PRN

## 2020-02-15 NOTE — Assessment & Plan Note (Signed)
Pain radiating from posterior hip to back of knee. Suspicious for sciatica. Patient given a medrol dospak and low dose Tizanidine to use bid prn. Encouraged moist heat and gentle stretching as tolerated. May try NSAIDs and prescription meds as directed and report if symptoms worsen or seek immediate care. If no improvement she will notify us so we can proceed with further imaging and referral

## 2020-02-15 NOTE — Assessment & Plan Note (Signed)
Monitor and report any concerns, no changes to meds. Encouraged heart healthy diet such as the DASH diet and exercise as tolerated.  ?

## 2020-02-15 NOTE — Assessment & Plan Note (Signed)
Hydrate and monitor 

## 2020-02-15 NOTE — Assessment & Plan Note (Signed)
hgba1c acceptable, minimize simple carbs. Increase exercise as tolerated. Continue current meds 

## 2020-02-15 NOTE — Progress Notes (Signed)
Virtual Visit via phone Note  I connected with Dana Burns on 02/15/20 at  3:00 PM EST by a video enabled telemedicine application and verified that I am speaking with the correct person using two identifiers.  Location: Patient: home Provider: office   I discussed the limitations of evaluation and management by telemedicine and the availability of in person appointments. The patient expressed understanding and agreed to proceed. Marin Roberts, CMA was able to get the patient set up on a visit, phone after being unable to set up a video visit   Subjective:    Patient ID: Dana Burns, female    DOB: 08-27-1926, 86 y.o.   MRN: [id number]  Chief Complaint  Patient presents with  . Leg Pain    Complains of right leg pain    HPI Patient is in today for evaluation of leg pain. She had an episode a couple weeks ago which resolved spontaneously but the current episode of right posterior hip pain with radiation noted to knee has been present for several days now and will not diminish. She denies any fall or trauma. Denies CP/palp/SOB/HA/congestion/fevers/GI or GU c/o. Taking meds as prescribed  Past Medical History:  Diagnosis Date  . Allergy    sneeze, rhinorrhea in am  . Chicken pox as a child  . Chronic pain syndrome 01/23/2015  . Colon cancer ([place])   . Complication of anesthesia    agitated when waking up, wakes up shaking, "like  I'm going into shock"  . Depression    husband died  3 months ago, pt. admits depression  currently   . Diabetes mellitus    6 years  . DM (diabetes mellitus) type 2, uncontrolled, with ketoacidosis ([place]) 03/17/2011  . Fibromyalgia   . GERD (gastroesophageal reflux disease)   . H/O echocardiogram    done /w [place] in 2011, saw Dr. Percival Spanish for tachycardia   . Headache(784.0)    occasional - "bad headaches"  . Hyperlipidemia    6 years  . Hypertension    15 years, reports she has a rapid heartrate   . Measles as a child  . Osteoarthritis    in  hands & all over, feet  . Pain in finger of right hand 05/15/2017  . Pain of right shoulder region 07/07/2014  . Peripheral neuropathy   . Shortness of breath   . Type 2 diabetes mellitus with peripheral neuropathy ([place]) 03/17/2011    Past Surgical History:  Procedure Laterality Date  . BREAST EXCISIONAL BIOPSY Right 2013   benign  . BREAST SURGERY  2013   right- benign  . CATARACT EXTRACTION     /w IOL- bilateral   . COLON SURGERY     For resection of cancer  . Nodule removed     From throat    Family History  Problem Relation Age of Onset  . Cancer Mother        Brain  . Cancer Father        Colorectal  . Cancer Sister        breast  . Other Brother        pacemaker  . Cancer Brother        pancreatic cancer  . Arthritis Sister   . Emphysema Brother   . Cancer Brother   . COPD Brother   . Neuropathy Daughter   . Fibromyalgia Daughter   . Cancer Brother        metastatic colon cancer  . Heart  disease Neg Hx        Early    Social History   Socioeconomic History  . Marital status: Widowed    Spouse name: Not on file  . Number of children: 1  . Years of education: 52  . Highest education level: Not on file  Occupational History  . Occupation: Retired  Tobacco Use  . Smoking status: Never Smoker  . Smokeless tobacco: Never Used  Substance and Sexual Activity  . Alcohol use: No  . Drug use: No  . Sexual activity: Never    Comment: lives with daughter. no dietary restrictions  Other Topics Concern  . Not on file  Social History Narrative   Patient lives at home with daughter.    Patient is retired.    Patient is widowed.    Patient has 1 child.    Patient has a 9th grade education.    Social Determinants of Health   Financial Resource Strain:   . Difficulty of Paying Living Expenses: Not on file  Food Insecurity:   . Worried About Charity fundraiser in the Last Year: Not on file  . Ran Out of Food in the Last Year: Not on file  Transportation  Needs:   . Lack of Transportation (Medical): Not on file  . Lack of Transportation (Non-Medical): Not on file  Physical Activity:   . Days of Exercise per Week: Not on file  . Minutes of Exercise per Session: Not on file  Stress:   . Feeling of Stress : Not on file  Social Connections:   . Frequency of Communication with Friends and Family: Not on file  . Frequency of Social Gatherings with Friends and Family: Not on file  . Attends Religious Services: Not on file  . Active Member of Clubs or Organizations: Not on file  . Attends Archivist Meetings: Not on file  . Marital Status: Not on file  Intimate Partner Violence:   . Fear of Current or Ex-Partner: Not on file  . Emotionally Abused: Not on file  . Physically Abused: Not on file  . Sexually Abused: Not on file    Outpatient Medications Prior to Visit  Medication Sig Dispense Refill  . calcium carbonate (OS-CAL) 600 MG TABS Take 600 mg by mouth 2 (two) times daily with a meal.      . Cholecalciferol (VITAMIN D3) 5000 UNITS CAPS Take 500 Units by mouth daily.     . citalopram (CELEXA) 10 MG tablet TAKE 1 TABLET BY MOUTH EVERY DAY 90 tablet 0  . Cyanocobalamin (B-12 IJ) Inject as directed.    . diazepam (VALIUM) 5 MG tablet TAKE 1/2 TO 1 TABLET DAILY AS NEEDED FOR ANXIETY 30 tablet 1  . gabapentin (NEURONTIN) 800 MG tablet TAKE 1 TABLET BY MOUTH THREE TIMES A DAY 270 tablet 1  . metFORMIN (GLUCOPHAGE) 500 MG tablet TAKE 1 TABLET BY MOUTH EVERY DAY WITH BREAKFAST 30 tablet 1  . pantoprazole (PROTONIX) 40 MG tablet TAKE 1 TABLET BY MOUTH EVERY DAY 90 tablet 1  . pravastatin (PRAVACHOL) 20 MG tablet Take 1 tablet (20 mg total) by mouth daily. 90 tablet 3  . traMADol (ULTRAM) 50 MG tablet TAKE 1 TABLET BY MOUTH EVERY 8 HOURS AS NEEDED FOR MODERATE PAIN 30 tablet 0   No facility-administered medications prior to visit.    Allergies  Allergen Reactions  . Tape Itching and Rash  . Actos [Pioglitazone Hydrochloride] Other  (See Comments)  Unknown  . Buspar [Buspirone Hcl] Other (See Comments)    Unknown  . Lipitor [Atorvastatin Calcium] Other (See Comments)    "It made me hurt all over."  . Penicillins Rash    Review of Systems  Constitutional: Negative for fever and malaise/fatigue.  HENT: Negative for congestion.   Eyes: Negative for blurred vision.  Respiratory: Negative for shortness of breath.   Cardiovascular: Negative for chest pain, palpitations and leg swelling.  Gastrointestinal: Negative for abdominal pain, blood in stool and nausea.  Genitourinary: Negative for dysuria and frequency.  Musculoskeletal: Positive for back pain, joint pain and myalgias. Negative for falls.  Skin: Negative for rash.  Neurological: Negative for dizziness, loss of consciousness and headaches.  Endo/Heme/Allergies: Negative for environmental allergies.  Psychiatric/Behavioral: Negative for depression. The patient is not nervous/anxious.        Objective:    Physical Exam unable to obtain via phone  There were no vitals taken for this visit. Wt Readings from Last 3 Encounters:  02/13/19 142 lb 9.6 oz (64.7 kg)  10/14/18 146 lb 12.8 oz (66.6 kg)  07/10/18 144 lb 6.4 oz (65.5 kg)    Diabetic Foot Exam - Simple   No data filed     Lab Results  Component Value Date   WBC 5.9 10/06/2019   HGB 12.7 10/06/2019   HCT 39.6 10/06/2019   PLT 245.0 10/06/2019   GLUCOSE 89 10/06/2019   CHOL 200 10/06/2019   TRIG 205.0 (H) 10/06/2019   HDL 40.90 10/06/2019   LDLDIRECT 122.0 10/06/2019   LDLCALC 192 (H) 02/13/2019   ALT 7 10/06/2019   AST 15 10/06/2019   NA 141 10/06/2019   K 5.1 10/06/2019   CL 103 10/06/2019   CREATININE 1.43 (H) 10/06/2019   BUN 22 10/06/2019   CO2 32 10/06/2019   TSH 2.24 10/06/2019   HGBA1C 6.9 (H) 10/06/2019   MICROALBUR 1.7 02/13/2019    Lab Results  Component Value Date   TSH 2.24 10/06/2019   Lab Results  Component Value Date   WBC 5.9 10/06/2019   HGB 12.7  10/06/2019   HCT 39.6 10/06/2019   MCV 84.7 10/06/2019   PLT 245.0 10/06/2019   Lab Results  Component Value Date   NA 141 10/06/2019   K 5.1 10/06/2019   CO2 32 10/06/2019   GLUCOSE 89 10/06/2019   BUN 22 10/06/2019   CREATININE 1.43 (H) 10/06/2019   BILITOT 0.3 10/06/2019   ALKPHOS 65 10/06/2019   AST 15 10/06/2019   ALT 7 10/06/2019   PROT 6.8 10/06/2019   ALBUMIN 4.2 10/06/2019   CALCIUM 9.7 10/06/2019   GFR 34.36 (L) 10/06/2019   Lab Results  Component Value Date   CHOL 200 10/06/2019   Lab Results  Component Value Date   HDL 40.90 10/06/2019   Lab Results  Component Value Date   LDLCALC 192 (H) 02/13/2019   Lab Results  Component Value Date   TRIG 205.0 (H) 10/06/2019   Lab Results  Component Value Date   CHOLHDL 5 10/06/2019   Lab Results  Component Value Date   HGBA1C 6.9 (H) 10/06/2019       Assessment & Plan:   Problem List Items Addressed This Visit    Essential hypertension, benign    Monitor and report any concerns, no changes to meds. Encouraged heart healthy diet such as the DASH diet and exercise as tolerated.       Type 2 diabetes mellitus with peripheral neuropathy (HCC)  hgba1c acceptable, minimize simple carbs. Increase exercise as tolerated. Continue current meds      Relevant Medications   tiZANidine (ZANAFLEX) 2 MG tablet   Chronic renal insufficiency    Hydrate and monitor      Pain in joint, lower leg    Pain radiating from posterior hip to back of knee. Suspicious for sciatica. Patient given a medrol dospak and low dose Tizanidine to use bid prn. Encouraged moist heat and gentle stretching as tolerated. May try NSAIDs and prescription meds as directed and report if symptoms worsen or seek immediate care. If no improvement she will notify us so we can proceed with further imaging and referral         I am having Dana Burns start on methylPREDNISolone and tiZANidine. I am also having her maintain her calcium  carbonate, Vitamin D3, Cyanocobalamin (B-12 IJ), pravastatin, gabapentin, traMADol, citalopram, pantoprazole, diazepam, and metFORMIN.  Meds ordered this encounter  Medications  . methylPREDNISolone (MEDROL) 4 MG tablet    Sig: 5 tab po qd X 1d then 4 tab po qd X 1d then 3 tab po qd X 1d then 2 tab po qd then 1 tab po qd    Dispense:  15 tablet    Refill:  0  . tiZANidine (ZANAFLEX) 2 MG tablet    Sig: Take 0.5-1 tablets (1-2 mg total) by mouth 2 (two) times daily as needed for muscle spasms.    Dispense:  30 tablet    Refill:  0     I discussed the assessment and treatment plan with the patient. The patient was provided an opportunity to ask questions and all were answered. The patient agreed with the plan and demonstrated an understanding of the instructions.   The patient was advised to call back or seek an in-person evaluation if the symptoms worsen or if the condition fails to improve as anticipated.  I provided 25 minutes of non-face-to-face time during this encounter.   Penni Homans, MD

## 2020-02-22 ENCOUNTER — Other Ambulatory Visit: Payer: Self-pay | Admitting: Family Medicine

## 2020-02-29 ENCOUNTER — Ambulatory Visit: Payer: Medicare Other | Admitting: Family Medicine

## 2020-02-29 ENCOUNTER — Other Ambulatory Visit: Payer: Self-pay

## 2020-03-05 ENCOUNTER — Other Ambulatory Visit: Payer: Self-pay | Admitting: Family Medicine

## 2020-03-16 ENCOUNTER — Other Ambulatory Visit: Payer: Self-pay | Admitting: Family Medicine

## 2020-03-20 ENCOUNTER — Other Ambulatory Visit: Payer: Self-pay | Admitting: Family Medicine

## 2020-03-21 ENCOUNTER — Other Ambulatory Visit: Payer: Self-pay | Admitting: Family Medicine

## 2020-03-27 ENCOUNTER — Other Ambulatory Visit: Payer: Self-pay | Admitting: Family Medicine

## 2020-03-29 ENCOUNTER — Other Ambulatory Visit: Payer: Self-pay | Admitting: *Deleted

## 2020-03-29 ENCOUNTER — Telehealth: Payer: Self-pay

## 2020-03-29 DIAGNOSIS — E782 Mixed hyperlipidemia: Secondary | ICD-10-CM

## 2020-03-29 DIAGNOSIS — E1142 Type 2 diabetes mellitus with diabetic polyneuropathy: Secondary | ICD-10-CM

## 2020-03-29 DIAGNOSIS — I10 Essential (primary) hypertension: Secondary | ICD-10-CM

## 2020-03-29 NOTE — Telephone Encounter (Signed)
SWP to R/S her appt on 4/5 and make her lab appt for April.  Pt was wanting to know how long she should continue to take the Tizanidine.  I reviewed with the pt that Dr.  Charlett Blake could put in orders for further imaging or referrals regarding her issue per the last visit she had with her, but she is reluctant d/t Covid.  Right now, she is just wanting to know how long to continue on the Tizanidine. She also has some questions about taking the Covid vaccination.

## 2020-03-30 NOTE — Telephone Encounter (Signed)
She probably needs an appt to discuss. The Tizanidine is only as needed so if she is feeling better can stop. If not but gets temporary relief from the tizanidine she should continue to use it as needed. I believe she should get the shot. We are not seeing an terribly high number of adverse reactions to the vaccine but people are still dying from [place] and it is unlikely to go away soon. But we can discuss pros and cons more fully in a visit

## 2020-03-30 NOTE — Telephone Encounter (Signed)
Patient notified and she will make and appointment for vaccine and she will start taking tizanidine only as needed.

## 2020-03-30 NOTE — Telephone Encounter (Signed)
Patient also wanted to with covid vaccine she is kinda afraid to take it because is allergic to a lot of things.  She also mentions that the pandemic is almost over with and everyone almost has had their shot and she just only goes to get her hair done and she dont think no one would have it there.  She also wanted to know in previous message about how long should she continue the tizanidine.

## 2020-04-04 ENCOUNTER — Ambulatory Visit: Payer: Medicare Other | Admitting: Family Medicine

## 2020-04-04 ENCOUNTER — Other Ambulatory Visit: Payer: Self-pay | Admitting: Family Medicine

## 2020-04-06 ENCOUNTER — Encounter: Payer: Self-pay | Admitting: *Deleted

## 2020-04-07 ENCOUNTER — Other Ambulatory Visit: Payer: Self-pay | Admitting: Family Medicine

## 2020-04-12 ENCOUNTER — Other Ambulatory Visit (INDEPENDENT_AMBULATORY_CARE_PROVIDER_SITE_OTHER): Payer: Medicare Other

## 2020-04-12 ENCOUNTER — Other Ambulatory Visit: Payer: Self-pay

## 2020-04-12 DIAGNOSIS — I10 Essential (primary) hypertension: Secondary | ICD-10-CM

## 2020-04-12 DIAGNOSIS — E1142 Type 2 diabetes mellitus with diabetic polyneuropathy: Secondary | ICD-10-CM

## 2020-04-12 DIAGNOSIS — E782 Mixed hyperlipidemia: Secondary | ICD-10-CM | POA: Diagnosis not present

## 2020-04-12 LAB — CBC
HCT: 38.5 % (ref 36.0–46.0)
Hemoglobin: 12.6 g/dL (ref 12.0–15.0)
MCHC: 32.6 g/dL (ref 30.0–36.0)
MCV: 83.2 fl (ref 78.0–100.0)
Platelets: 236 10*3/uL (ref 150.0–400.0)
RBC: 4.63 Mil/uL (ref 3.87–5.11)
RDW: 16 % — ABNORMAL HIGH (ref 11.5–15.5)
WBC: 5.1 10*3/uL (ref 4.0–10.5)

## 2020-04-12 LAB — COMPREHENSIVE METABOLIC PANEL
ALT: 7 U/L (ref 0–35)
AST: 14 U/L (ref 0–37)
Albumin: 4.2 g/dL (ref 3.5–5.2)
Alkaline Phosphatase: 70 U/L (ref 39–117)
BUN: 28 mg/dL — ABNORMAL HIGH (ref 6–23)
CO2: 33 mEq/L — ABNORMAL HIGH (ref 19–32)
Calcium: 9.4 mg/dL (ref 8.4–10.5)
Chloride: 101 mEq/L (ref 96–112)
Creatinine, Ser: 1.35 mg/dL — ABNORMAL HIGH (ref 0.40–1.20)
GFR: 36.67 mL/min — ABNORMAL LOW (ref >60.00)
Glucose, Bld: 79 mg/dL (ref 70–99)
Potassium: 5.2 mEq/L — ABNORMAL HIGH (ref 3.5–5.1)
Sodium: 138 mEq/L (ref 135–145)
Total Bilirubin: 0.3 mg/dL (ref 0.2–1.2)
Total Protein: 6.7 g/dL (ref 6.0–8.3)

## 2020-04-12 LAB — LIPID PANEL
Cholesterol: 206 mg/dL — ABNORMAL HIGH (ref 0–200)
HDL: 39.1 mg/dL (ref >39.00)
NonHDL: 167.32
Total CHOL/HDL Ratio: 5
Triglycerides: 223 mg/dL — ABNORMAL HIGH (ref 0.0–149.0)
VLDL: 44.6 mg/dL — ABNORMAL HIGH (ref 0.0–40.0)

## 2020-04-12 LAB — HEMOGLOBIN A1C: Hgb A1c MFr Bld: 6.8 % — ABNORMAL HIGH (ref 4.6–6.5)

## 2020-04-12 LAB — LDL CHOLESTEROL, DIRECT: Direct LDL: 120 mg/dL

## 2020-04-14 ENCOUNTER — Other Ambulatory Visit: Payer: Self-pay | Admitting: *Deleted

## 2020-04-14 DIAGNOSIS — E875 Hyperkalemia: Secondary | ICD-10-CM

## 2020-04-18 ENCOUNTER — Telehealth: Payer: Self-pay | Admitting: Family Medicine

## 2020-04-18 ENCOUNTER — Other Ambulatory Visit: Payer: Self-pay | Admitting: Family Medicine

## 2020-04-18 DIAGNOSIS — G894 Chronic pain syndrome: Secondary | ICD-10-CM

## 2020-04-18 NOTE — Telephone Encounter (Signed)
Called the patient back and when she was told her lab results last week she did not understand her instructions. She thought PCP had sent something in for potassium/. I did copy results instructions from labs (below) went over with the patient again and she did verbalize understanding this time,  Also, scheduled her lab appointment for 05/13/2020 to repeat CMP.  The patient had no other questions or concern .   Notify potassium up slightly, decrease potassium in diet and repeat CMP in 1 month. Also cholesterol is up slightly but not enough to chang anything, Encouraged heart healthy diet, increase exercise, avoid trans fats, consider a krill oil cap daily no other changes

## 2020-04-18 NOTE — Telephone Encounter (Signed)
Caller: Dana Burns  Call Back # [phone]  Subject: Lab Results   Patient states that she was advised to go to Pharmacy to obtain medication for evelated potassium. Patient was until get medication, patient stated that medication had to a prescriptions not over the counter. Please advise

## 2020-04-18 NOTE — Telephone Encounter (Signed)
Called left message to call back 

## 2020-04-20 DIAGNOSIS — E119 Type 2 diabetes mellitus without complications: Secondary | ICD-10-CM | POA: Diagnosis not present

## 2020-04-21 ENCOUNTER — Telehealth: Payer: Self-pay | Admitting: *Deleted

## 2020-04-21 NOTE — Telephone Encounter (Signed)
Received a fax from [facility] for test strips and lancets.   Per patient she does not use this company.  Form shredded

## 2020-05-13 ENCOUNTER — Other Ambulatory Visit: Payer: Medicare Other

## 2020-05-19 ENCOUNTER — Telehealth: Payer: Self-pay | Admitting: Family Medicine

## 2020-05-19 ENCOUNTER — Other Ambulatory Visit: Payer: Self-pay

## 2020-05-19 ENCOUNTER — Other Ambulatory Visit: Payer: Self-pay | Admitting: Family Medicine

## 2020-05-19 ENCOUNTER — Other Ambulatory Visit (INDEPENDENT_AMBULATORY_CARE_PROVIDER_SITE_OTHER): Payer: Medicare Other

## 2020-05-19 DIAGNOSIS — E1142 Type 2 diabetes mellitus with diabetic polyneuropathy: Secondary | ICD-10-CM

## 2020-05-19 DIAGNOSIS — E875 Hyperkalemia: Secondary | ICD-10-CM

## 2020-05-19 DIAGNOSIS — F419 Anxiety disorder, unspecified: Secondary | ICD-10-CM

## 2020-05-19 NOTE — Telephone Encounter (Signed)
Caller: Zophia Call back phone: [phone] Medication: diazepam (VALIUM) 5 MG tablet       Has the patient contacted their pharmacy?yes  (If no, request that the patient contact the pharmacy for the refill.) (If yes, when and what did the pharmacy advise?) To contact doctor office     Preferred Pharmacy (with phone number or street name): [id number] [address], [place].  [address] [place] [address]  Phone:  [phone] [phone]     Agent: Please be advised that RX refills may take up to 3 business days. We ask that you follow-up with your pharmacy.

## 2020-05-19 NOTE — Telephone Encounter (Signed)
Requesting: valium Contract:no UDS:04/10/18 Last Visit:02/15/20 Next Visit:08/16/20 Last Refill:01/21/20  Please Advise

## 2020-05-19 NOTE — Telephone Encounter (Signed)
rx already sent in.  lmom that rx has been sent in.

## 2020-05-20 LAB — COMPREHENSIVE METABOLIC PANEL
ALT: 7 U/L (ref 0–35)
AST: 15 U/L (ref 0–37)
Albumin: 4.2 g/dL (ref 3.5–5.2)
Alkaline Phosphatase: 64 U/L (ref 39–117)
BUN: 21 mg/dL (ref 6–23)
CO2: 32 mEq/L (ref 19–32)
Calcium: 9.6 mg/dL (ref 8.4–10.5)
Chloride: 102 mEq/L (ref 96–112)
Creatinine, Ser: 1.44 mg/dL — ABNORMAL HIGH (ref 0.40–1.20)
GFR: 34.03 mL/min — ABNORMAL LOW (ref >60.00)
Glucose, Bld: 110 mg/dL — ABNORMAL HIGH (ref 70–99)
Potassium: 4.6 mEq/L (ref 3.5–5.1)
Sodium: 139 mEq/L (ref 135–145)
Total Bilirubin: 0.3 mg/dL (ref 0.2–1.2)
Total Protein: 6.6 g/dL (ref 6.0–8.3)

## 2020-05-20 NOTE — Telephone Encounter (Signed)
Patient was made aware and daughter will be picking up medication today.

## 2020-06-04 ENCOUNTER — Other Ambulatory Visit: Payer: Self-pay | Admitting: Family Medicine

## 2020-06-06 ENCOUNTER — Telehealth: Payer: Self-pay | Admitting: Family Medicine

## 2020-06-06 NOTE — Progress Notes (Signed)
°  Chronic Care Management   Note  06/06/2020 Name: [name] MRN: [id number] DOB: 1926/06/17  Dana Burns is a 86 y.o. year old female who is a primary care patient of Mosie Lukes, MD. I reached out to Dana Burns by phone today in response to a referral sent by Ms. Francene Boyers Burr's PCP, Mosie Lukes, MD.   [name] was given information about Chronic Care Management services today including:  1. CCM service includes personalized support from designated clinical staff supervised by her physician, including individualized plan of care and coordination with other care providers 2. 24/7 contact phone numbers for assistance for urgent and routine care needs. 3. Service will only be billed when office clinical staff spend 20 minutes or more in a month to coordinate care. 4. Only one practitioner may furnish and bill the service in a calendar month. 5. The patient may stop CCM services at any time (effective at the end of the month) by phone call to the office staff.   Patient agreed to services and verbal consent obtained.   This note is not being shared with the patient for the following reason: To respect privacy (The patient or proxy has requested that the information not be shared).  Follow up plan:   Earney [place] [facility] Scheduler

## 2020-07-13 ENCOUNTER — Other Ambulatory Visit: Payer: Self-pay | Admitting: Family Medicine

## 2020-07-16 ENCOUNTER — Other Ambulatory Visit: Payer: Self-pay | Admitting: Family Medicine

## 2020-07-25 ENCOUNTER — Other Ambulatory Visit: Payer: Self-pay | Admitting: *Deleted

## 2020-07-25 DIAGNOSIS — E782 Mixed hyperlipidemia: Secondary | ICD-10-CM

## 2020-07-25 DIAGNOSIS — I10 Essential (primary) hypertension: Secondary | ICD-10-CM

## 2020-07-25 DIAGNOSIS — E1142 Type 2 diabetes mellitus with diabetic polyneuropathy: Secondary | ICD-10-CM

## 2020-07-26 ENCOUNTER — Ambulatory Visit: Payer: Medicare Other | Admitting: Pharmacist

## 2020-07-26 DIAGNOSIS — I10 Essential (primary) hypertension: Secondary | ICD-10-CM

## 2020-07-26 DIAGNOSIS — E782 Mixed hyperlipidemia: Secondary | ICD-10-CM

## 2020-07-26 DIAGNOSIS — M25569 Pain in unspecified knee: Secondary | ICD-10-CM

## 2020-07-26 DIAGNOSIS — E1142 Type 2 diabetes mellitus with diabetic polyneuropathy: Secondary | ICD-10-CM

## 2020-07-26 NOTE — Patient Instructions (Signed)
Visit Information  Goals Addressed            This Visit's Progress    Chronic Care Management Pharmacy Care Plan       CARE PLAN ENTRY (see longitudinal plan of care for additional care plan information)  Current Barriers:   Chronic Disease Management support, education, and care coordination needs related to Diabetes, Hypertension, Depression/Anxiety, GERD, Neuropathy/Fibromyalgia, Pain  Hyperlipidemia Lab Results  Component Value Date/Time   LDLCALC 192 (H) 02/13/2019 10:09 AM   LDLCALC 96 06/21/2014 02:49 PM   LDLCALC 77 03/16/2014 05:08 PM   LDLCALC 73 05/29/2013 11:31 AM   LDLDIRECT 120.0 04/12/2020 01:55 PM    Pharmacist Clinical Goal(s): o Over the next 90 days, patient will work with PharmD and providers to achieve LDL goal < 100  Current regimen:   Pravastatin 20mg  daily  Krill Oil 350mg  daily  Patient self care activities - Over the next 90 days, patient will: o Maintain cholesterol medication regimen.   Diabetes Lab Results  Component Value Date/Time   HGBA1C 6.8 (H) 04/12/2020 01:55 PM   HGBA1C 6.9 (H) 10/06/2019 02:12 PM    Pharmacist Clinical Goal(s): o Over the next 90 days, patient will work with PharmD and providers to maintain A1c goal <7%  Current regimen:  o Metformin 500mg  daily  Patient self care activities - Over the next 90 days, patient will: o Check blood sugar once daily, document, and provide at future appointments o Contact provider with any episodes of hypoglycemia  Pain  Pharmacist Clinical Goal(s) o Over the next 90 days, patient will work with PharmD and providers to reduce symptoms of pain  Current regimen:  o Tramadol 50mg  every 8 hours as needed  Interventions: o Consider taking tylenol 500mg  three times daily scheduled for pain  Patient self care activities - Over the next 90 days, patient will: o Consider taking tylenol 500mg  three times daily scheduled for pain  Medication management  Pharmacist Clinical  Goal(s): o Over the next 90 days, patient will work with PharmD and providers to maintain optimal medication adherence  [facility]  Interventions o Comprehensive medication review performed. o Continue current medication management strategy  Patient self care activities - Over the next 90 days, patient will: o Focus on medication adherence by filling and taking medications appropriately o Take medications as prescribed o Report any questions or concerns to PharmD and/or provider(s)  Initial goal documentation        Dana Burns was given information about Chronic Care Management services today including:  1. CCM service includes personalized support from designated clinical staff supervised by her physician, including individualized plan of care and coordination with other care providers 2. 24/7 contact phone numbers for assistance for urgent and routine care needs. 3. Standard insurance, coinsurance, copays and deductibles apply for chronic care management only during months in which we provide at least 20 minutes of these services. Most insurances cover these services at 100%, however patients may be responsible for any copay, coinsurance and/or deductible if applicable. This service may help you avoid the need for more expensive face-to-face services. 4. Only one practitioner may furnish and bill the service in a calendar month. 5. The patient may stop CCM services at any time (effective at the end of the month) by phone call to the office staff.  Patient agreed to services and verbal consent obtained.   The patient verbalized understanding of instructions provided today and agreed to receive a mailed copy of  patient instruction and/or Scientist, clinical (histocompatibility and immunogenetics). Telephone follow up appointment with pharmacy team member scheduled for: 08/02/2020  Dana Burns, PharmD Clinical Pharmacist [facility] [phone]   Cholesterol Content in  Foods Cholesterol is a waxy, fat-like substance that helps to carry fat in the blood. The body needs cholesterol in small amounts, but too much cholesterol can cause damage to the arteries and heart. Most people should eat less than 200 milligrams (mg) of cholesterol a Dana Burns. Foods with cholesterol  Cholesterol is found in animal-based foods, such as meat, seafood, and dairy. Generally, low-fat dairy and lean meats have less cholesterol than full-fat dairy and fatty meats. The milligrams of cholesterol per serving (mg per serving) of common cholesterol-containing foods are listed below. Meat and other proteins  Egg -- one large whole egg has 186 mg.  Veal shank -- 4 oz has 141 mg.  Lean ground [place] (93% lean) -- 4 oz has 118 mg.  Fat-trimmed lamb loin -- 4 oz has 106 mg.  Lean ground beef (90% lean) -- 4 oz has 100 mg.  Lobster -- 3.5 oz has 90 mg.  Pork loin chops -- 4 oz has 86 mg.  Canned salmon -- 3.5 oz has 83 mg.  Fat-trimmed beef top loin -- 4 oz has 78 mg.  Frankfurter -- 1 frank (3.5 oz) has 77 mg.  Crab -- 3.5 oz has 71 mg.  Roasted chicken without skin, white meat -- 4 oz has 66 mg.  Light bologna -- 2 oz has 45 mg.  Deli-cut [place] -- 2 oz has 31 mg.  Canned tuna -- 3.5 oz has 31 mg.  Dana Burns -- 1 oz has 29 mg.  Oysters and mussels (raw) -- 3.5 oz has 25 mg.  Mackerel -- 1 oz has 22 mg.  Trout -- 1 oz has 20 mg.  Pork sausage -- 1 link (1 oz) has 17 mg.  Salmon -- 1 oz has 16 mg.  Tilapia -- 1 oz has 14 mg. Dairy  Soft-serve ice cream --  cup (4 oz) has 103 mg.  Whole-milk yogurt -- 1 cup (8 oz) has 29 mg.  Cheddar cheese -- 1 oz has 28 mg.  American cheese -- 1 oz has 28 mg.  Whole milk -- 1 cup (8 oz) has 23 mg.  2% milk -- 1 cup (8 oz) has 18 mg.  Cream cheese -- 1 tablespoon (Tbsp) has 15 mg.  Cottage cheese --  cup (4 oz) has 14 mg.  Low-fat (1%) milk -- 1 cup (8 oz) has 10 mg.  Sour cream -- 1 Tbsp has 8.5 mg.  Low-fat yogurt  -- 1 cup (8 oz) has 8 mg.  Nonfat Greek yogurt -- 1 cup (8 oz) has 7 mg.  Half-and-half cream -- 1 Tbsp has 5 mg. Fats and oils  Cod liver oil -- 1 tablespoon (Tbsp) has 82 mg.  Butter -- 1 Tbsp has 15 mg.  Lard -- 1 Tbsp has 14 mg.  Bacon grease -- 1 Tbsp has 14 mg.  Mayonnaise -- 1 Tbsp has 5-10 mg.  Margarine -- 1 Tbsp has 3-10 mg. Exact amounts of cholesterol in these foods may vary depending on specific ingredients and brands. Foods without cholesterol Most plant-based foods do not have cholesterol unless you combine them with a food that has cholesterol. Foods without cholesterol include:  Grains and cereals.  Vegetables.  Fruits.  Vegetable oils, such as olive, canola, and sunflower oil.  Legumes, such as  peas, beans, and lentils.  Nuts and seeds.  Egg whites. Summary  The body needs cholesterol in small amounts, but too much cholesterol can cause damage to the arteries and heart.  Most people should eat less than 200 milligrams (mg) of cholesterol a Dana Burns. This information is not intended to replace advice given to you by your health care provider. Make sure you discuss any questions you have with your health care provider. Document Revised: 11/29/2017 Document Reviewed: 08/13/2017 Elsevier Patient Education  [employer].

## 2020-07-26 NOTE — Chronic Care Management (AMB) (Signed)
Chronic Care Management Pharmacy  Name: Dana Burns  MRN: [id number] DOB: Mar 24, 1926   Chief Complaint/ HPI  Dana Burns,  86 y.o. , female presents for their Initial CCM visit with the clinical pharmacist via telephone due to COVID-19 Pandemic.  PCP : Mosie Lukes, MD  Their chronic conditions include: Diabetes, Hypertension, Depression/Anxiety, GERD, Neuropathy/Fibromyalgia, Pain  Office Visits: 02/15/20: Visit w/ Dr. Charlett Blake - Leg pain prescribed tizanidine and medrol dose pak  Consult Visit: None in the last 6 months.   Medications: Outpatient Encounter Medications as of 07/26/2020  Medication Sig Note  . Cholecalciferol (VITAMIN D3) 50 MCG (2000 UT) capsule Take 2,000 Units by mouth daily.    . citalopram (CELEXA) 10 MG tablet TAKE 1 TABLET BY MOUTH EVERY Jimi Schappert   . Cyanocobalamin (B-12 IJ) Place 1,000 mcg under the tongue.    . diazepam (VALIUM) 5 MG tablet TAKE 1/2 TO 1 TABLET DAILY AS NEEDED FOR ANXIETY   . gabapentin (NEURONTIN) 800 MG tablet TAKE 1 TABLET BY MOUTH THREE TIMES A Jeniel Slauson   . metFORMIN (GLUCOPHAGE) 500 MG tablet TAKE 1 TABLET BY MOUTH EVERY Edgerrin Correia WITH BREAKFAST   . pantoprazole (PROTONIX) 40 MG tablet Take 1 tablet (40 mg total) by mouth daily.   . pravastatin (PRAVACHOL) 20 MG tablet TAKE 1 TABLET BY MOUTH EVERY Shanee Batch   . traMADol (ULTRAM) 50 MG tablet TAKE 1 TABLET BY MOUTH EVERY 8 HOURS AS NEEDED FOR MODERATE PAIN. (INS COVERS 7 Yiselle Babich SUPPLY)   . calcium carbonate (OS-CAL) 600 MG TABS Take 600 mg by mouth 2 (two) times daily with a meal.     . methylPREDNISolone (MEDROL) 4 MG tablet 5 tab po qd X 1d then 4 tab po qd X 1d then 3 tab po qd X 1d then 2 tab po qd then 1 tab po qd (Patient not taking: Reported on 07/26/2020) 07/26/2020: Feels this was helpful for her  . tiZANidine (ZANAFLEX) 2 MG tablet TAKE 0.5-1 TABLETS BY MOUTH 2 (TWO) TIMES DAILY AS NEEDED FOR MUSCLE SPASMS. 07/26/2020: Doesn't have any more refills   No facility-administered encounter medications on  file as of 07/26/2020.   SDOH Screenings   Alcohol Screen:   . Last Alcohol Screening Score (AUDIT):   Depression (PHQ2-9): Medium Risk  . PHQ-2 Score: 17  Financial Resource Strain:   . Difficulty of Paying Living Expenses:   Food Insecurity:   . Worried About Charity fundraiser in the Last Year:   . [place] in the Last Year:   Housing:   . Last Housing Risk Score:   Physical Activity:   . Days of Exercise per Week:   . Minutes of Exercise per Session:   Social Connections:   . Frequency of Communication with Friends and Family:   . Frequency of Social Gatherings with Friends and Family:   . Attends Religious Services:   . Active Member of Clubs or Organizations:   . Attends Archivist Meetings:   Marland Kitchen Marital Status:   Stress:   . Feeling of Stress :   Tobacco Use:   . Smoking Tobacco Use:   . Smokeless Tobacco Use:   Transportation Needs:   . Lack of Transportation (Medical):   Marland Kitchen Lack of Transportation (Non-Medical):     Current Diagnosis/Assessment:  Goals Addressed            This Visit's Progress   . Chronic Care Management Pharmacy Care Plan  CARE PLAN ENTRY (see longitudinal plan of care for additional care plan information)  Current Barriers:  . Chronic Disease Management support, education, and care coordination needs related to Diabetes, Hypertension, Depression/Anxiety, GERD, Neuropathy/Fibromyalgia, Pain  Hyperlipidemia Lab Results  Component Value Date/Time   LDLCALC 192 (H) 02/13/2019 10:09 AM   LDLCALC 96 06/21/2014 02:49 PM   LDLCALC 77 03/16/2014 05:08 PM   LDLCALC 73 05/29/2013 11:31 AM   LDLDIRECT 120.0 04/12/2020 01:55 PM   . Pharmacist Clinical Goal(s): o Over the next 90 days, patient will work with PharmD and providers to achieve LDL goal < 100 . Current regimen:  . Pravastatin 41m daily . Krill Oil 3551mdaily . Patient self care activities - Over the next 90 days, patient will: o Maintain cholesterol  medication regimen.   Diabetes Lab Results  Component Value Date/Time   HGBA1C 6.8 (H) 04/12/2020 01:55 PM   HGBA1C 6.9 (H) 10/06/2019 02:12 PM   . Pharmacist Clinical Goal(s): o Over the next 90 days, patient will work with PharmD and providers to maintain A1c goal <7% . Current regimen:  o Metformin 5001maily . Patient self care activities - Over the next 90 days, patient will: o Check blood sugar once daily, document, and provide at future appointments o Contact provider with any episodes of hypoglycemia  Pain . Pharmacist Clinical Goal(s) o Over the next 90 days, patient will work with PharmD and providers to reduce symptoms of pain . Current regimen:  o Tramadol 40m62mery 8 hours as needed . Interventions: o Consider taking tylenol 500mg62mee times daily scheduled for pain . Patient self care activities - Over the next 90 days, patient will: o Consider taking tylenol 500mg 44me times daily scheduled for pain  Medication management . Pharmacist Clinical Goal(s): o Over the next 90 days, patient will work with PharmD and providers to maintain optimal medication adherence . [facility] . Interventions o Comprehensive medication review performed. o Continue current medication management strategy . Patient self care activities - Over the next 90 days, patient will: o Focus on medication adherence by filling and taking medications appropriately o Take medications as prescribed o Report any questions or concerns to PharmD and/or provider(s)  Initial goal documentation      Social Hx:  Has 4 brothers and they all passed away. 3 died from cancer and 1 from heart attack.  Has 3 sisters that are still alive and 3 sisters died.  Has a daughter who lives with her. Daughter's husband died from cancer. First husband passed away, she remarried and second husband passed away also. She has 2 grandchildren and 2 great grandchildren. All boys.  [place]   A1c goal <7%  Recent Relevant Labs: Lab Results  Component Value Date/Time   HGBA1C 6.8 (H) 04/12/2020 01:55 PM   HGBA1C 6.9 (H) 10/06/2019 02:12 PM   GFR 34.03 (L) 05/19/2020 02:25 PM   GFR 36.67 (L) 04/12/2020 01:55 PM   MICROALBUR 1.7 02/13/2019 10:09 AM   MICROALBUR <0.7 09/30/2017 02:22 PM    Last diabetic Eye exam:  Lab Results  Component Value Date/Time   HMDIABEYEEXA No Retinopathy 04/03/2017 12:00 AM    Last diabetic Foot exam: No results found for: HMDIABFOOTEX   Checking BG: Daily  Recent FBG Readings: Highest 134 111 117 119 97 109 103 Avg 109.3  Patient has failed these meds in past: Actos (listed in allergies)  Patient is currently controlled on the following  medications: Marland Kitchen Metformin 551m daily  We discussed: DM goals  Plan -Continue current medications   Hyperlipidemia   LDL goal <100  Lipid Panel     Component Value Date/Time   CHOL 206 (H) 04/12/2020 1355   CHOL 180 06/21/2014 1449   CHOL 148 05/29/2013 1131   TRIG 223.0 (H) 04/12/2020 1355   TRIG 117 03/16/2014 1708   TRIG 133 05/29/2013 1131   HDL 39.10 04/12/2020 1355   HDL 56 06/21/2014 1449   HDL 55 03/16/2014 1708   HDL 48 05/29/2013 1131   LDLCALC 192 (H) 02/13/2019 1009   LDLCALC 96 06/21/2014 1449   LDLCALC 77 03/16/2014 1708   LDLCALC 73 05/29/2013 1131   LDLDIRECT 120.0 04/12/2020 1355    Hepatic Function Latest Ref Rng & Units 05/19/2020 04/12/2020 10/06/2019  Total Protein 6.0 - 8.3 g/dL 6.6 6.7 6.8  Albumin 3.5 - 5.2 g/dL 4.2 4.2 4.2  AST 0 - 37 U/L _0 ALT 0 - 35 U/L _1 Alk Phosphatase 39 - 117 U/L 64 70 65  Total Bilirubin 0.2 - 1.2 mg/dL 0.3 0.3 0.3  Bilirubin, Direct 0.0 - 0.3 mg/dL - - -     The ASCVD Risk score ([place], et al., 2013) failed to calculate for the following reasons:   The 2013 ASCVD risk score is only valid for ages 417to 730  Patient has failed these meds in past: Lipitor (hurt all over) Patient is  currently uncontrolled on the following medications:  . Pravastatin 283mdaily . Krill Oil 35062maily (pt states she was recommended to start this at last visit)  We discussed:  Lipid goals  Plan -Continue current medications  Depression/Anxiety    Patient has failed these meds in past: Buspar (listed in allergies), cymbalta and lyrica (listed as not tolerated in [place] neuro note from 07/01/14) Patient is currently controlled on the following medications:  Citalopram 63m65mily  Diazepam 5mg 49m tab as needed  PHQ9: 17 (denies suicidal ideation)  We discussed:  That pain may be the largest contributor to her depression symptoms. She would like to address the pain before considering change in depression regimen  Plan -Continue current medications   Future Plan -Consider increasing citalopram, switching to different SSRI, or switching to SNRI to help with pain.   GERD    Patient has failed these meds in past: omeprazole, esomeprazole, dexlansoprazole (listed in D/C meds. No apparent reason for D/C) Patient is currently controlled on the following medications:   Pantoprazole 40mg 39my  Pt states she has a hiatal hernia.  She states she does occasionally have breakthrough symptoms, but is associated with the foods she eats.   She drinks water, vinegar, and baking soda and states this helps.   Plan -Continue current medications   Neuropathy/Fibromyalgia    Patient has failed these meds in past: Savella (swelling?) Patient is currently uncontrolled on the following medications:   Gabapentin 800mg T68mNeuropathy pain vs osteoarthritic pain? Will provide recommendation for osteoarthritic pain to see if pain is lessened.  Patient is anxious to have follow up with Dr. Blyth tCharlett Blakecuss her pain.  Plan -Continue current medications   Pain    Patient has failed these meds in past: None noted  Patient is currently uncontrolled on the following  medications:  Tramadol 50mg ev4m8 hours as needed  Tizanidine 2mg 1/2 19m1 tab twice daily as needed for muscle spasm (doesn't have refills)  She states she has significant pain in her legs and feet. States the pain is aching, burning, stabbing, etc. She states the pain starts in her back, then goes down to her hip into her feet. She was in so much pain she only slept for about 2 hours last night  Felt the methylpredisolone was helpful. Has been in pain for a year.   Patient has tylenol 571m at home. States she got scared about using tylenol in the past due to concern for liver.   States she has swelling in her ankles. In the R>L. Can't wear shoes due to this.  Reports she has SOB.   We discussed:  We discussed the max dose of tylenol is 40060mper Tynlee Bayle noting that 150064mer Maysoon Lozada is not even half of this limit. Her lfts are WNL. We also discussed how scheduled pain medicine can be helpful vs "chasing pain".  Plan -Consider taking tylenol 500m59mD to help with pain -Continue current medications  -Will follow up in 1 week to see if this helps.   Keeps medications throughout her house, but fixes a weekly pill box. Patient can identify what all of her medications are for Will offer [employer] next week

## 2020-08-02 ENCOUNTER — Ambulatory Visit: Payer: Medicare Other | Admitting: Pharmacist

## 2020-08-02 DIAGNOSIS — G894 Chronic pain syndrome: Secondary | ICD-10-CM

## 2020-08-02 DIAGNOSIS — M797 Fibromyalgia: Secondary | ICD-10-CM

## 2020-08-02 NOTE — Chronic Care Management (AMB) (Signed)
Chronic Care Management Pharmacy  Name: Dana Burns  MRN: [id number] DOB: [birth year]   Chief Complaint/ HPI  Dana Burns,  86 y.o. , female presents for their Follow-Up CCM visit with the clinical pharmacist via telephone due to COVID-19 Pandemic.  PCP : Mosie Lukes, MD  Their chronic conditions include: Diabetes, Hypertension, Depression/Anxiety, GERD, Neuropathy/Fibromyalgia, Pain  Office Visits: None since last CCM visit on 07/26/20.   Consult Visit: None since last CCM visit on 07/26/20.    Medications: Outpatient Encounter Medications as of 08/02/2020  Medication Sig Note  . calcium carbonate (OS-CAL) 600 MG TABS Take 600 mg by mouth 2 (two) times daily with a meal.     . Cholecalciferol (VITAMIN D3) 50 MCG (2000 UT) capsule Take 2,000 Units by mouth daily.    . citalopram (CELEXA) 10 MG tablet TAKE 1 TABLET BY MOUTH EVERY Dayshaun Whobrey   . Cyanocobalamin (B-12 IJ) Place 1,000 mcg under the tongue.    . diazepam (VALIUM) 5 MG tablet TAKE 1/2 TO 1 TABLET DAILY AS NEEDED FOR ANXIETY   . gabapentin (NEURONTIN) 800 MG tablet TAKE 1 TABLET BY MOUTH THREE TIMES A Charvis Lightner   . metFORMIN (GLUCOPHAGE) 500 MG tablet TAKE 1 TABLET BY MOUTH EVERY Lorien Shingler WITH BREAKFAST   . methylPREDNISolone (MEDROL) 4 MG tablet 5 tab po qd X 1d then 4 tab po qd X 1d then 3 tab po qd X 1d then 2 tab po qd then 1 tab po qd (Patient not taking: Reported on 07/26/2020) 07/26/2020: Feels this was helpful for her  . pantoprazole (PROTONIX) 40 MG tablet Take 1 tablet (40 mg total) by mouth daily.   . pravastatin (PRAVACHOL) 20 MG tablet TAKE 1 TABLET BY MOUTH EVERY Arcenia Scarbro   . tiZANidine (ZANAFLEX) 2 MG tablet TAKE 0.5-1 TABLETS BY MOUTH 2 (TWO) TIMES DAILY AS NEEDED FOR MUSCLE SPASMS. 07/26/2020: Doesn't have any more refills  . traMADol (ULTRAM) 50 MG tablet TAKE 1 TABLET BY MOUTH EVERY 8 HOURS AS NEEDED FOR MODERATE PAIN. (INS COVERS 7 Ricahrd Schwager SUPPLY)    No facility-administered encounter medications on file as of 08/02/2020.    SDOH Screenings   Alcohol Screen:   . Last Alcohol Screening Score (AUDIT):   Depression (PHQ2-9): Medium Risk  . PHQ-2 Score: 17  Financial Resource Strain:   . Difficulty of Paying Living Expenses:   Food Insecurity:   . Worried About Charity fundraiser in the Last Year:   . Hall in the Last Year:   Housing:   . Last Housing Risk Score:   Physical Activity:   . Days of Exercise per Week:   . Minutes of Exercise per Session:   Social Connections:   . Frequency of Communication with Friends and Family:   . Frequency of Social Gatherings with Friends and Family:   . Attends Religious Services:   . Active Member of Clubs or Organizations:   . Attends Archivist Meetings:   Marland Kitchen Marital Status:   Stress:   . Feeling of Stress :   Tobacco Use:   . Smoking Tobacco Use:   . Smokeless Tobacco Use:   Transportation Needs:   . Lack of Transportation (Medical):   Marland Kitchen Lack of Transportation (Non-Medical):     Current Diagnosis/Assessment:  Goals Addressed            This Visit's Progress   . Chronic Care Management Pharmacy Care Plan       CARE PLAN  ENTRY (see longitudinal plan of care for additional care plan information)  Current Barriers:  . Chronic Disease Management support, education, and care coordination needs related to Diabetes, Hypertension, Depression/Anxiety, GERD, Neuropathy/Fibromyalgia, Pain  Hyperlipidemia Lab Results  Component Value Date/Time   LDLCALC 192 (H) 02/13/2019 10:09 AM   LDLCALC 96 06/21/2014 02:49 PM   LDLCALC 77 03/16/2014 05:08 PM   LDLCALC 73 05/29/2013 11:31 AM   LDLDIRECT 120.0 04/12/2020 01:55 PM   . Pharmacist Clinical Goal(s): o Over the next 90 days, patient will work with PharmD and providers to achieve LDL goal < 100 . Current regimen:  . Pravastatin 20mg  daily . Krill Oil 350mg  daily . Patient self care activities - Over the next 90 days, patient will: o Maintain cholesterol medication regimen.    Diabetes Lab Results  Component Value Date/Time   HGBA1C 6.8 (H) 04/12/2020 01:55 PM   HGBA1C 6.9 (H) 10/06/2019 02:12 PM   . Pharmacist Clinical Goal(s): o Over the next 90 days, patient will work with PharmD and providers to maintain A1c goal <7% . Current regimen:  o Metformin 500mg  daily . Patient self care activities - Over the next 90 days, patient will: o Check blood sugar once daily, document, and provide at future appointments o Contact provider with any episodes of hypoglycemia  Pain . Pharmacist Clinical Goal(s) o Over the next 90 days, patient will work with PharmD and providers to reduce symptoms of pain . Current regimen:  o Tramadol 50mg  every 8 hours as needed . Interventions: o Consider increasing tylenol to 100mg  three times daily scheduled for pain . Patient self care activities - Over the next 90 days, patient will: o Consider increasing tylenol to 100mg  three times daily scheduled for pain o Discuss further options with Dr. Charlett Blake  Medication management . Pharmacist Clinical Goal(s): o Over the next 90 days, patient will work with PharmD and providers to maintain optimal medication adherence . Current [facility]: [facility] . Interventions o Comprehensive medication review performed. o Continue current medication management strategy . Patient self care activities - Over the next 90 days, patient will: o Focus on medication adherence by filling and taking medications appropriately o Take medications as prescribed o Report any questions or concerns to PharmD and/or provider(s)  Please see past updates related to this goal by clicking on the "Past Updates" button in the selected goal       Social Hx:  Has 4 brothers and they all passed away. 3 died from cancer and 1 from heart attack.  Has 3 sisters that are still alive and 3 sisters died.  Has a daughter who lives with her. Daughter's husband died from cancer. First husband passed away, she remarried and  second husband passed away also. She has 2 grandchildren and 2 great grandchildren. All boys.  Goes to church in [place].   Depression/Anxiety    Patient has failed these meds in past: Buspar (listed in allergies), cymbalta and lyrica (listed as not tolerated in [place] neuro note from 07/01/14) Patient is currently controlled on the following medications:  Citalopram 10mg  daily  Diazepam 5mg  1/2 tab as needed  PHQ9: 17 (denies suicidal ideation)  We discussed:  That pain may be the largest contributor to her depression symptoms. She would like to address the pain before considering change in depression regimen  Plan -Continue current medications   Update 08/02/20 Still working to manage pain regimen to see if this helps with depression symtpoms  Future Plan -Consider increasing citalopram, switching  to different SSRI, or switching to SNRI to help with pain.    Neuropathy/Fibromyalgia    Patient has failed these meds in past: Savella (swelling?), lyrica (listed as not tolerated in [place] neuro note from 07/01/14) Patient is currently uncontrolled on the following medications:   Gabapentin 800mg  TID  Neuropathy pain vs osteoarthritic pain? Will provide recommendation for osteoarthritic pain to see if pain is lessened.  Patient is anxious to have follow up with Dr. Charlett Blake to discuss her pain.  Update 08/02/20  Kidney Function Lab Results  Component Value Date/Time   CREATININE 1.44 (H) 05/19/2020 02:25 PM   CREATININE 1.35 (H) 04/12/2020 01:55 PM   CREATININE 1.44 (H) 11/13/2016 02:44 PM   CREATININE 1.47 (H) 09/14/2016 02:42 PM   GFR 34.03 (L) 05/19/2020 02:25 PM   GFRNONAA 32 (L) 11/13/2016 02:44 PM   GFRAA 37 (L) 11/13/2016 02:44 PM   K 4.6 05/19/2020 02:25 PM   K 5.2 (H) 04/12/2020 01:55 PM   TBW (total body weight) = Unable to assess IBW (ideal body weight) = 45.5kg +2.3kg (6) = 59.3kg CrCl = [(140-92) X (59.3)]/(72 x 1.44) = 27.4 mL/min  Recommended dose of  gabapentin for CrCl of 15-29 is 600mg /Ireta Pullman is 1 to 2 divided doses.  Patient states she tried to stop gabapentin before and she felt like she was going crazy.   Plan -Continue current medications   Future Plan -Consider titrating down/off of gabapentin and starting alternative such as amitriptyline (do not normally recommend amitriptyline in the elderly due to anticholinergic effects, but pt is currently on gabapentin and has not tolerated cymbalta, savella, and lyrica.  Also no renal adjustment needed) -Recommend starting amitriptyline 10mg  daily HS if agreeable with Dr. Charlett Blake   Pain    Patient has failed these meds in past: None noted  Patient is currently uncontrolled on the following medications:  Tramadol 50mg  every 8 hours as needed  Tizanidine 2mg  1/2 to 1 tab twice daily as needed for muscle spasm (doesn't have refills)  She states she has significant pain in her legs and feet. States the pain is aching, burning, stabbing, etc. She states the pain starts in her back, then goes down to her hip into her feet. She was in so much pain she only slept for about 2 hours last night  Felt the methylpredisolone was helpful. Has been in pain for a year.   Patient has tylenol 500mg  at home. States she got scared about using tylenol in the past due to concern for liver.   States she has swelling in her ankles. In the R>L. Can't wear shoes due to this.  Reports she has SOB.   We discussed:  We discussed the max dose of tylenol is 4000mg  per Yaritza Leist noting that 1500mg  per Jalaya Sarver is not even half of this limit. Her lfts are WNL. We also discussed how scheduled pain medicine can be helpful vs "chasing pain".  Update 08/02/20 She did start taking tylenol TID.  States that sometimes she feels the tylenol helps, but at other times it doesn't. She said the first 2 days it helped, but after that not as much. Feels riding in the car to church (~45 min drive) causes her to hurt more.  She rubs aspercreme  and alcohol on her legs daily.  Plan -Consider taking tylenol 1000mg  TID to help with pain -Continue current medications  -Will follow up in 1 week to see if this helps.   [employer] Patient is satisfied with her current  medication management strategy noting that her daughter picks up all of her medications for her.

## 2020-08-02 NOTE — Patient Instructions (Signed)
Visit Information  Goals Addressed            This Visit's Progress   . Chronic Care Management Pharmacy Care Plan       CARE PLAN ENTRY (see longitudinal plan of care for additional care plan information)  Current Barriers:  . Chronic Disease Management support, education, and care coordination needs related to Diabetes, Hypertension, Depression/Anxiety, GERD, Neuropathy/Fibromyalgia, Pain  Hyperlipidemia Lab Results  Component Value Date/Time   LDLCALC 192 (H) 02/13/2019 10:09 AM   LDLCALC 96 06/21/2014 02:49 PM   LDLCALC 77 03/16/2014 05:08 PM   LDLCALC 73 05/29/2013 11:31 AM   LDLDIRECT 120.0 04/12/2020 01:55 PM   . Pharmacist Clinical Goal(s): o Over the next 90 days, patient will work with PharmD and providers to achieve LDL goal < 100 . Current regimen:  . Pravastatin 20mg  daily . Krill Oil 350mg  daily . Patient self care activities - Over the next 90 days, patient will: o Maintain cholesterol medication regimen.   Diabetes Lab Results  Component Value Date/Time   HGBA1C 6.8 (H) 04/12/2020 01:55 PM   HGBA1C 6.9 (H) 10/06/2019 02:12 PM   . Pharmacist Clinical Goal(s): o Over the next 90 days, patient will work with PharmD and providers to maintain A1c goal <7% . Current regimen:  o Metformin 500mg  daily . Patient self care activities - Over the next 90 days, patient will: o Check blood sugar once daily, document, and provide at future appointments o Contact provider with any episodes of hypoglycemia  Pain . Pharmacist Clinical Goal(s) o Over the next 90 days, patient will work with PharmD and providers to reduce symptoms of pain . Current regimen:  o Tramadol 50mg  every 8 hours as needed . Interventions: o Consider increasing tylenol to 100mg  three times daily scheduled for pain . Patient self care activities - Over the next 90 days, patient will: o Consider increasing tylenol to 100mg  three times daily scheduled for pain o Discuss further options with Dr.  Charlett Blake  Medication management . Pharmacist Clinical Goal(s): o Over the next 90 days, patient will work with PharmD and providers to maintain optimal medication adherence . [facility] . Interventions o Comprehensive medication review performed. o Continue current medication management strategy . Patient self care activities - Over the next 90 days, patient will: o Focus on medication adherence by filling and taking medications appropriately o Take medications as prescribed o Report any questions or concerns to PharmD and/or provider(s)  Please see past updates related to this goal by clicking on the "Past Updates" button in the selected goal         Patient verbalizes understanding of instructions provided today.   Telephone follow up appointment with pharmacy team member scheduled for: 08/09/2020  Melvenia Beam Caiden Monsivais, PharmD Clinical Pharmacist [facility] [place] [phone]

## 2020-08-02 NOTE — Progress Notes (Signed)
I have collaborated with the care management provider regarding care management and care coordination activities outlined in this encounter and have reviewed this encounter including documentation in the note and care plan. I am certifying that I agree with the content of this note and encounter as supervising physician.  

## 2020-08-10 ENCOUNTER — Other Ambulatory Visit: Payer: Self-pay | Admitting: Family Medicine

## 2020-08-10 ENCOUNTER — Ambulatory Visit: Payer: Medicare Other | Admitting: Pharmacist

## 2020-08-10 ENCOUNTER — Other Ambulatory Visit: Payer: Self-pay

## 2020-08-10 DIAGNOSIS — E119 Type 2 diabetes mellitus without complications: Secondary | ICD-10-CM | POA: Diagnosis not present

## 2020-08-10 DIAGNOSIS — G894 Chronic pain syndrome: Secondary | ICD-10-CM

## 2020-08-10 DIAGNOSIS — M797 Fibromyalgia: Secondary | ICD-10-CM

## 2020-08-10 NOTE — Chronic Care Management (AMB) (Signed)
Chronic Care Management Pharmacy  Name: Dana Burns  MRN: [id number] DOB: 1926/06/18   Chief Complaint/ HPI  Dana Burns,  86 y.o. , female presents for their Follow-Up CCM visit with the clinical pharmacist via telephone due to COVID-19 Pandemic.  PCP : Mosie Lukes, MD  Their chronic conditions include: Diabetes, Hypertension, Depression/Anxiety, GERD, Neuropathy/Fibromyalgia, Pain  Office Visits: None since last CCM visit on 08/02/20.   Consult Visit: None since last CCM visit on 08/02/20.   Medications: Outpatient Encounter Medications as of 08/10/2020  Medication Sig Note  . calcium carbonate (OS-CAL) 600 MG TABS Take 600 mg by mouth 2 (two) times daily with a meal.     . Cholecalciferol (VITAMIN D3) 50 MCG (2000 UT) capsule Take 2,000 Units by mouth daily.    . citalopram (CELEXA) 10 MG tablet TAKE 1 TABLET BY MOUTH EVERY Milagros Middendorf   . Cyanocobalamin (B-12 IJ) Place 1,000 mcg under the tongue.    . diazepam (VALIUM) 5 MG tablet TAKE 1/2 TO 1 TABLET DAILY AS NEEDED FOR ANXIETY   . gabapentin (NEURONTIN) 800 MG tablet TAKE 1 TABLET BY MOUTH THREE TIMES A Endrit Gittins   . metFORMIN (GLUCOPHAGE) 500 MG tablet TAKE 1 TABLET BY MOUTH EVERY Mariaguadalupe Fialkowski WITH BREAKFAST   . methylPREDNISolone (MEDROL) 4 MG tablet 5 tab po qd X 1d then 4 tab po qd X 1d then 3 tab po qd X 1d then 2 tab po qd then 1 tab po qd (Patient not taking: Reported on 07/26/2020) 07/26/2020: Feels this was helpful for her  . pantoprazole (PROTONIX) 40 MG tablet Take 1 tablet (40 mg total) by mouth daily.   . pravastatin (PRAVACHOL) 20 MG tablet TAKE 1 TABLET BY MOUTH EVERY Sera Hitsman   . tiZANidine (ZANAFLEX) 2 MG tablet TAKE 0.5-1 TABLETS BY MOUTH 2 (TWO) TIMES DAILY AS NEEDED FOR MUSCLE SPASMS. 07/26/2020: Doesn't have any more refills  . traMADol (ULTRAM) 50 MG tablet TAKE 1 TABLET BY MOUTH EVERY 8 HOURS AS NEEDED FOR MODERATE PAIN. (INS COVERS 7 Corey Caulfield SUPPLY)   . [DISCONTINUED] gabapentin (NEURONTIN) 800 MG tablet TAKE 1 TABLET BY MOUTH THREE  TIMES A Reon Hunley    No facility-administered encounter medications on file as of 08/10/2020.   SDOH Screenings   Alcohol Screen:   . Last Alcohol Screening Score (AUDIT):   Depression (PHQ2-9): Medium Risk  . PHQ-2 Score: 17  Financial Resource Strain:   . Difficulty of Paying Living Expenses:   Food Insecurity:   . Worried About Charity fundraiser in the Last Year:   . [place] in the Last Year:   Housing:   . Last Housing Risk Score:   Physical Activity:   . Days of Exercise per Week:   . Minutes of Exercise per Session:   Social Connections:   . Frequency of Communication with Friends and Family:   . Frequency of Social Gatherings with Friends and Family:   . Attends Religious Services:   . Active Member of Clubs or Organizations:   . Attends Archivist Meetings:   Marland Kitchen Marital Status:   Stress:   . Feeling of Stress :   Tobacco Use:   . Smoking Tobacco Use:   . Smokeless Tobacco Use:   Transportation Needs:   . Lack of Transportation (Medical):   Marland Kitchen Lack of Transportation (Non-Medical):     Current Diagnosis/Assessment:  Goals Addressed            This Visit's Progress   .  Chronic Care Management Pharmacy Care Plan       CARE PLAN ENTRY (see longitudinal plan of care for additional care plan information)  Current Barriers:  . Chronic Disease Management support, education, and care coordination needs related to Diabetes, Hypertension, Depression/Anxiety, GERD, Neuropathy/Fibromyalgia, Pain  Hyperlipidemia Lab Results  Component Value Date/Time   LDLCALC 192 (H) 02/13/2019 10:09 AM   LDLCALC 96 06/21/2014 02:49 PM   LDLCALC 77 03/16/2014 05:08 PM   LDLCALC 73 05/29/2013 11:31 AM   LDLDIRECT 120.0 04/12/2020 01:55 PM   . Pharmacist Clinical Goal(s): o Over the next 90 days, patient will work with PharmD and providers to achieve LDL goal < 100 . Current regimen:  . Pravastatin 20mg  daily . Krill Oil 350mg  daily . Patient self care activities -  Over the next 90 days, patient will: o Maintain cholesterol medication regimen.   Diabetes Lab Results  Component Value Date/Time   HGBA1C 6.8 (H) 04/12/2020 01:55 PM   HGBA1C 6.9 (H) 10/06/2019 02:12 PM   . Pharmacist Clinical Goal(s): o Over the next 90 days, patient will work with PharmD and providers to maintain A1c goal <7% . Current regimen:  o Metformin 500mg  daily . Patient self care activities - Over the next 90 days, patient will: o Check blood sugar once daily, document, and provide at future appointments o Contact provider with any episodes of hypoglycemia  Neuropathy . Pharmacist Clinical Goal(s) o Over the next 90 days, patient will work with PharmD and providers to reduce symptoms of neuropathy and leg pain . Current regimen:  o Gabapentin 800mg  TID . Patient self care activities - Over the next 90 days, patient will: o Maintain neuropathy medication regimen.  Pain . Pharmacist Clinical Goal(s) o Over the next 90 days, patient will work with PharmD and providers to reduce symptoms of pain . Current regimen:  o Tramadol 50mg  every 8 hours as needed . Interventions: o Consider increasing tylenol to 100mg  three times daily scheduled for pain . Patient self care activities - Over the next 90 days, patient will: o Consider increasing tylenol to 100mg  three times daily scheduled for pain o Discuss further options with Dr. Charlett Blake  Medication management . Pharmacist Clinical Goal(s): o Over the next 90 days, patient will work with PharmD and providers to maintain optimal medication adherence . [facility] . Interventions o Comprehensive medication review performed. o Continue current medication management strategy . Patient self care activities - Over the next 90 days, patient will: o Focus on medication adherence by filling and taking medications appropriately o Take medications as prescribed o Report any questions or concerns to PharmD and/or  provider(s)  Please see past updates related to this goal by clicking on the "Past Updates" button in the selected goal       Social Hx:  Has 4 brothers and they all passed away. 3 died from cancer and 1 from heart attack.  Has 3 sisters that are still alive and 3 sisters died.  Has a daughter who lives with her. Daughter's husband died from cancer. First husband passed away, she remarried and second husband passed away also. She has 2 grandchildren and 2 great grandchildren. All boys.  Goes to church in [place].   Depression/Anxiety    Patient has failed these meds in past: Buspar (listed in allergies), cymbalta and lyrica (listed as not tolerated in [place] neuro note from 07/01/14) Patient is currently controlled on the following medications:  Citalopram 10mg  daily  Diazepam 5mg  1/2  tab as needed  PHQ9: 17 (denies suicidal ideation)  We discussed:  That pain may be the largest contributor to her depression symptoms. She would like to address the pain before considering change in depression regimen  Plan -Continue current medications   Update 08/02/20 Still working to manage pain regimen to see if this helps with depression symtpoms  Update 08/10/20 To be evaluated for pain 08/23/20. Will assess further after pain evaluation.  Future Plan -Consider increasing citalopram, switching to different SSRI, or switching to SNRI to help with pain.    Neuropathy/Fibromyalgia    Patient has failed these meds in past: Savella (swelling?), lyrica (listed as not tolerated in [facility] neuro note from 07/01/14) Patient is currently uncontrolled on the following medications:   Gabapentin 800mg  TID  Update 08/02/20 Neuropathy pain vs osteoarthritic pain? Will provide recommendation for osteoarthritic pain to see if pain is lessened.  Patient is anxious to have follow up with Dr. Charlett Blake to discuss her pain.  Kidney Function Lab Results  Component Value Date/Time   CREATININE 1.44 (H)  05/19/2020 02:25 PM   CREATININE 1.35 (H) 04/12/2020 01:55 PM   CREATININE 1.44 (H) 11/13/2016 02:44 PM   CREATININE 1.47 (H) 09/14/2016 02:42 PM   GFR 34.03 (L) 05/19/2020 02:25 PM   GFRNONAA 32 (L) 11/13/2016 02:44 PM   GFRAA 37 (L) 11/13/2016 02:44 PM   K 4.6 05/19/2020 02:25 PM   K 5.2 (H) 04/12/2020 01:55 PM   TBW (total body weight) = Unable to assess IBW (ideal body weight) = 45.5kg +2.3kg (6) = 59.3kg CrCl = [(140-92) X (59.3)]/(72 x 1.44) = 27.4 mL/min  Recommended dose of gabapentin for CrCl of 15-29 is 600mg /Hershell Brandl is 1 to 2 divided doses.  Patient states she tried to stop gabapentin before and she felt like she was going crazy.   Update 08/10/20 She states she is still having pain. She wonders if it is due to poor circulation. She would like to find out the cause of her pain.  She states she can't stand long without having pain. Possible intermittent claudication? Pt states she has not had her foot circulation evaluated before.  Plan -Continue current medications   Future Plan -Consider titrating down/off of gabapentin and starting alternative such as amitriptyline (do not normally recommend amitriptyline in the elderly due to anticholinergic effects, but pt is currently on gabapentin and has not tolerated cymbalta, savella, and lyrica.  Also no renal adjustment needed) -Recommend starting amitriptyline 10mg  daily HS if agreeable with Dr. Charlett Blake  -Consider evaluation for intermittent claudication? Possible rx for cilostazol?   Pain    Patient has failed these meds in past: None noted  Patient is currently uncontrolled on the following medications:  Tramadol 50mg  every 8 hours as needed  Tizanidine 2mg  1/2 to 1 tab twice daily as needed for muscle spasm (doesn't have refills)  She states she has significant pain in her legs and feet. States the pain is aching, burning, stabbing, etc. She states the pain starts in her back, then goes down to her hip into her feet. She  was in so much pain she only slept for about 2 hours last night  Felt the methylpredisolone was helpful. Has been in pain for a year.   Patient has tylenol 500mg  at home. States she got scared about using tylenol in the past due to concern for liver.   States she has swelling in her ankles. In the R>L. Can't wear shoes due to this.  Reports she  has SOB.   We discussed:  We discussed the max dose of tylenol is 4000mg  per Nuel Dejaynes noting that 1500mg  per Claudia Greenley is not even half of this limit. Her lfts are WNL. We also discussed how scheduled pain medicine can be helpful vs "chasing pain".  Update 08/02/20 She did start taking tylenol TID.  States that sometimes she feels the tylenol helps, but at other times it doesn't. She said the first 2 days it helped, but after that not as much. Feels riding in the car to church (~45 min drive) causes her to hurt more.  She rubs aspercreme and alcohol on her legs daily.  Update 08/10/20 Still having pain. She looks forward to being seen for further evaluation.  Plan -Continue current medications  -Follow up at visit on 08/23/20  [facility] Patient is satisfied with her current medication management strategy noting that her daughter picks up all of her medications for her.

## 2020-08-10 NOTE — Patient Instructions (Signed)
Visit Information  Goals Addressed            This Visit's Progress   . Chronic Care Management Pharmacy Care Plan       CARE PLAN ENTRY (see longitudinal plan of care for additional care plan information)  Current Barriers:  . Chronic Disease Management support, education, and care coordination needs related to Diabetes, Hypertension, Depression/Anxiety, GERD, Neuropathy/Fibromyalgia, Pain  Hyperlipidemia Lab Results  Component Value Date/Time   LDLCALC 192 (H) 02/13/2019 10:09 AM   LDLCALC 96 06/21/2014 02:49 PM   LDLCALC 77 03/16/2014 05:08 PM   LDLCALC 73 05/29/2013 11:31 AM   LDLDIRECT 120.0 04/12/2020 01:55 PM   . Pharmacist Clinical Goal(s): o Over the next 90 days, patient will work with PharmD and providers to achieve LDL goal < 100 . Current regimen:  . Pravastatin 20mg  daily . Krill Oil 350mg  daily . Patient self care activities - Over the next 90 days, patient will: o Maintain cholesterol medication regimen.   Diabetes Lab Results  Component Value Date/Time   HGBA1C 6.8 (H) 04/12/2020 01:55 PM   HGBA1C 6.9 (H) 10/06/2019 02:12 PM   . Pharmacist Clinical Goal(s): o Over the next 90 days, patient will work with PharmD and providers to maintain A1c goal <7% . Current regimen:  o Metformin 500mg  daily . Patient self care activities - Over the next 90 days, patient will: o Check blood sugar once daily, document, and provide at future appointments o Contact provider with any episodes of hypoglycemia  Neuropathy . Pharmacist Clinical Goal(s) o Over the next 90 days, patient will work with PharmD and providers to reduce symptoms of neuropathy and leg pain . Current regimen:  o Gabapentin 800mg  TID . Patient self care activities - Over the next 90 days, patient will: o Maintain neuropathy medication regimen.  Pain . Pharmacist Clinical Goal(s) o Over the next 90 days, patient will work with PharmD and providers to reduce symptoms of pain . Current regimen:   o Tramadol 50mg  every 8 hours as needed . Interventions: o Consider increasing tylenol to 100mg  three times daily scheduled for pain . Patient self care activities - Over the next 90 days, patient will: o Consider increasing tylenol to 100mg  three times daily scheduled for pain o Discuss further options with Dr. Charlett Blake  Medication management . Pharmacist Clinical Goal(s): o Over the next 90 days, patient will work with PharmD and providers to maintain optimal medication adherence . [facility] . Interventions o Comprehensive medication review performed. o Continue current medication management strategy . Patient self care activities - Over the next 90 days, patient will: o Focus on medication adherence by filling and taking medications appropriately o Take medications as prescribed o Report any questions or concerns to PharmD and/or provider(s)  Please see past updates related to this goal by clicking on the "Past Updates" button in the selected goal         Patient verbalizes understanding of instructions provided today.   Telephone follow up appointment with pharmacy team member scheduled for: 11/10/2020  Melvenia Beam Mandalyn Pasqua, PharmD Clinical Pharmacist [facility] [phone]

## 2020-08-16 ENCOUNTER — Ambulatory Visit: Payer: Medicare Other | Admitting: Family Medicine

## 2020-08-22 ENCOUNTER — Ambulatory Visit (INDEPENDENT_AMBULATORY_CARE_PROVIDER_SITE_OTHER): Payer: Medicare Other | Admitting: Family

## 2020-08-22 ENCOUNTER — Encounter: Payer: Self-pay | Admitting: Family

## 2020-08-22 ENCOUNTER — Other Ambulatory Visit: Payer: Self-pay

## 2020-08-22 VITALS — BP 120/58 | HR 71 | Temp 98.1°F | Resp 16 | Ht 66.5 in | Wt 142.0 lb

## 2020-08-22 DIAGNOSIS — F419 Anxiety disorder, unspecified: Secondary | ICD-10-CM

## 2020-08-22 DIAGNOSIS — E538 Deficiency of other specified B group vitamins: Secondary | ICD-10-CM | POA: Diagnosis not present

## 2020-08-22 DIAGNOSIS — E782 Mixed hyperlipidemia: Secondary | ICD-10-CM

## 2020-08-22 DIAGNOSIS — G894 Chronic pain syndrome: Secondary | ICD-10-CM

## 2020-08-22 DIAGNOSIS — E1142 Type 2 diabetes mellitus with diabetic polyneuropathy: Secondary | ICD-10-CM | POA: Diagnosis not present

## 2020-08-22 DIAGNOSIS — Z79899 Other long term (current) drug therapy: Secondary | ICD-10-CM | POA: Diagnosis not present

## 2020-08-22 DIAGNOSIS — R0989 Other specified symptoms and signs involving the circulatory and respiratory systems: Secondary | ICD-10-CM | POA: Diagnosis not present

## 2020-08-22 DIAGNOSIS — G8929 Other chronic pain: Secondary | ICD-10-CM

## 2020-08-22 MED ORDER — TIZANIDINE HCL 2 MG PO TABS: ORAL_TABLET | ORAL | 0 refills | Status: DC

## 2020-08-22 MED ORDER — TRAMADOL HCL 50 MG PO TABS: ORAL_TABLET | ORAL | 0 refills | Status: DC

## 2020-08-22 NOTE — Progress Notes (Signed)
Subjective:    Patient ID: Dana Burns, female    DOB: November 23, 1926, 86 y.o.   MRN: [id number]  HPI   Patient is a 86 yr old female who presents today for follow up.  DM2- maintained on metformin. Reports that her sugar was 114 this AM  Lab Results  Component Value Date   HGBA1C 6.8 (H) 04/12/2020   HGBA1C 6.9 (H) 10/06/2019   HGBA1C 6.7 (H) 02/13/2019   Lab Results  Component Value Date   MICROALBUR 1.7 02/13/2019   LDLCALC 192 (H) 02/13/2019   CREATININE 1.44 (H) 05/19/2020   Hyperlipidemia- maintained on pravastatin.  Lab Results  Component Value Date   CHOL 206 (H) 04/12/2020   HDL 39.10 04/12/2020   LDLCALC 192 (H) 02/13/2019   LDLDIRECT 120.0 04/12/2020   TRIG 223.0 (H) 04/12/2020   CHOLHDL 5 04/12/2020   GERD- maintained on protonix 40mg  once daily. Reports well controlled.   B12 deficiency- maintained on monthly b12 injections.   Chronic pain syndrome- maintained on tramadol 50mg . She reports that she has run out of tramadol.  She states she takes rarely. Having a great deal of pain recently. She has hx of of fibromyalgia and osteoarthritis. She also c/o chronic foot pain which she attributes to her neuropathy.    Review of Systems Past Medical History:  Diagnosis Date  . Allergy    sneeze, rhinorrhea in am  . Chicken pox as a child  . Chronic pain syndrome 01/23/2015  . Colon cancer ([place])   . Complication of anesthesia    agitated when waking up, wakes up shaking, "like  I'm going into shock"  . Depression    husband died  3 months ago, pt. admits depression  currently   . Diabetes mellitus    6 years  . DM (diabetes mellitus) type 2, uncontrolled, with ketoacidosis ([place]) 03/17/2011  . Fibromyalgia   . GERD (gastroesophageal reflux disease)   . H/O echocardiogram    done /w  in 2011, saw Dr. Percival Spanish for tachycardia   . Headache(784.0)    occasional - "bad headaches"  . Hyperlipidemia    6 years  . Hypertension    15 years, reports she has  a rapid heartrate   . Measles as a child  . Osteoarthritis    in hands & all over, feet  . Pain in finger of right hand 05/15/2017  . Pain of right shoulder region 07/07/2014  . Peripheral neuropathy   . Shortness of breath   . Type 2 diabetes mellitus with peripheral neuropathy ([place]) 03/17/2011     Social History   Socioeconomic History  . Marital status: Widowed    Spouse name: Not on file  . Number of children: 1  . Years of education: 25  . Highest education level: Not on file  Occupational History  . Occupation: Retired  Tobacco Use  . Smoking status: Never Smoker  . Smokeless tobacco: Never Used  Substance and Sexual Activity  . Alcohol use: No  . Drug use: No  . Sexual activity: Never    Comment: lives with daughter. no dietary restrictions  Other Topics Concern  . Not on file  Social History Narrative   Patient lives at home with daughter.    Patient is retired.    Patient is widowed.    Patient has 1 child.    Patient has a 9th grade education.    Social Determinants of Health   Financial Resource Strain:   .  Difficulty of Paying Living Expenses: Not on file  Food Insecurity:   . Worried About Charity fundraiser in the Last Year: Not on file  . Ran Out of Food in the Last Year: Not on file  Transportation Needs:   . Lack of Transportation (Medical): Not on file  . Lack of Transportation (Non-Medical): Not on file  Physical Activity:   . Days of Exercise per Week: Not on file  . Minutes of Exercise per Session: Not on file  Stress:   . Feeling of Stress : Not on file  Social Connections:   . Frequency of Communication with Friends and Family: Not on file  . Frequency of Social Gatherings with Friends and Family: Not on file  . Attends Religious Services: Not on file  . Active Member of Clubs or Organizations: Not on file  . Attends Archivist Meetings: Not on file  . Marital Status: Not on file  Intimate Partner Violence:   . Fear of Current  or Ex-Partner: Not on file  . Emotionally Abused: Not on file  . Physically Abused: Not on file  . Sexually Abused: Not on file    Past Surgical History:  Procedure Laterality Date  . BREAST EXCISIONAL BIOPSY Right 2013   benign  . BREAST SURGERY  2013   right- benign  . CATARACT EXTRACTION     /w IOL- bilateral   . COLON SURGERY     For resection of cancer  . Nodule removed     From throat    Family History  Problem Relation Age of Onset  . Cancer Mother        Brain  . Cancer Father        Colorectal  . Cancer Sister        breast  . Other Brother        pacemaker  . Cancer Brother        pancreatic cancer  . Arthritis Sister   . Emphysema Brother   . Cancer Brother   . COPD Brother   . Neuropathy Daughter   . Fibromyalgia Daughter   . Cancer Brother        metastatic colon cancer  . Heart disease Neg Hx        Early    Allergies  Allergen Reactions  . Tape Itching and Rash  . Actos [Pioglitazone Hydrochloride] Other (See Comments)    Unknown  . Buspar [Buspirone Hcl] Other (See Comments)    Unknown  . Lipitor [Atorvastatin Calcium] Other (See Comments)    "It made me hurt all over."  . Penicillins Rash    Current Outpatient Medications on File Prior to Visit  Medication Sig Dispense Refill  . calcium carbonate (OS-CAL) 600 MG TABS Take 600 mg by mouth 2 (two) times daily with a meal.      . Cholecalciferol (VITAMIN D3) 50 MCG (2000 UT) capsule Take 2,000 Units by mouth daily.     . citalopram (CELEXA) 10 MG tablet TAKE 1 TABLET BY MOUTH EVERY DAY 90 tablet 0  . Cyanocobalamin (B-12 IJ) Place 1,000 mcg under the tongue.     . diazepam (VALIUM) 5 MG tablet TAKE 1/2 TO 1 TABLET DAILY AS NEEDED FOR ANXIETY 30 tablet 1  . gabapentin (NEURONTIN) 800 MG tablet TAKE 1 TABLET BY MOUTH THREE TIMES A DAY 270 tablet 1  . metFORMIN (GLUCOPHAGE) 500 MG tablet TAKE 1 TABLET BY MOUTH EVERY DAY WITH BREAKFAST 90 tablet 0  .  pantoprazole (PROTONIX) 40 MG tablet Take  1 tablet (40 mg total) by mouth daily. 90 tablet 3  . pravastatin (PRAVACHOL) 20 MG tablet TAKE 1 TABLET BY MOUTH EVERY DAY 90 tablet 3  . tiZANidine (ZANAFLEX) 2 MG tablet TAKE 0.5-1 TABLETS BY MOUTH 2 (TWO) TIMES DAILY AS NEEDED FOR MUSCLE SPASMS. 30 tablet 0  . traMADol (ULTRAM) 50 MG tablet TAKE 1 TABLET BY MOUTH EVERY 8 HOURS AS NEEDED FOR MODERATE PAIN. (INS COVERS 7 DAY SUPPLY) 21 tablet 1   No current facility-administered medications on file prior to visit.    There were no vitals taken for this visit.      Objective:   Physical Exam Constitutional:      Appearance: She is well-developed.  Cardiovascular:     Rate and Rhythm: Normal rate and regular rhythm.     Pulses:          Dorsalis pedis pulses are 1+ on the right side and 1+ on the left side.       Posterior tibial pulses are 1+ on the right side and 1+ on the left side.     Heart sounds: Normal heart sounds. No murmur heard.   Pulmonary:     Effort: Pulmonary effort is normal. No respiratory distress.     Breath sounds: Normal breath sounds. No wheezing.  Musculoskeletal:        General: Swelling (2+ bilateral LE edema) present.  Skin:    Comments: Tinea pedis rash bilaterally L>R  Psychiatric:        Behavior: Behavior normal.        Thought Content: Thought content normal.        Judgment: Judgment normal.           Assessment & Plan:  Decreased pedal pulses- will refer for ABI.  DM2- clinically stable on metformin.  Obtain A1c  Hyperlipidemia- LDL above goal, obtain follow up lipid panel.  b12 deficiency- obtain follow up b12 level. Continues monthly injections.  Chronic pain syndrome- drug screen and contract updated today. Pt advised as follows:  For pain management please add tylenol 1000mg  twice daily. Take 1 tablet of tramadol (pain medicine) in the AM- you may repeat later in the day if needed.  Tinea pedis- advised otc lotrimin cream bid.    Anxiety- continues valium HS.    This visit  occurred during the SARS-CoV-2 public health emergency.  Safety protocols were in place, including screening questions prior to the visit, additional usage of staff PPE, and extensive cleaning of exam room while observing appropriate contact time as indicated for disinfecting solutions.

## 2020-08-22 NOTE — Patient Instructions (Signed)
For pain management please add tylenol 1000mg  twice daily. Take 1 tablet of tramadol (pain medicine) in the AM- you may repeat later in the day if needed. For rash on feet- please begin over the counter lotrimin cream twice daily.  Please complete lab work prior to leaving.

## 2020-08-23 LAB — LIPID PANEL
Cholesterol: 186 mg/dL (ref <200)
HDL: 40 mg/dL — ABNORMAL LOW (ref >=50)
LDL Cholesterol (Calc): 118 mg/dL (calc) — ABNORMAL HIGH
Non-HDL Cholesterol (Calc): 146 mg/dL (calc) — ABNORMAL HIGH (ref <130)
Total CHOL/HDL Ratio: 4.7 (calc) (ref <5.0)
Triglycerides: 162 mg/dL — ABNORMAL HIGH (ref <150)

## 2020-08-23 LAB — HEMOGLOBIN A1C
Hgb A1c MFr Bld: 6.4 % of total Hgb — ABNORMAL HIGH (ref <5.7)
Mean Plasma Glucose: 137 (calc)
eAG (mmol/L): 7.6 (calc)

## 2020-08-23 LAB — BASIC METABOLIC PANEL
BUN/Creatinine Ratio: 14 (calc) (ref 6–22)
BUN: 21 mg/dL (ref 7–25)
CO2: 30 mmol/L (ref 20–32)
Calcium: 9.8 mg/dL (ref 8.6–10.4)
Chloride: 102 mmol/L (ref 98–110)
Creat: 1.47 mg/dL — ABNORMAL HIGH (ref 0.60–0.88)
Glucose, Bld: 99 mg/dL (ref 65–99)
Potassium: 4.9 mmol/L (ref 3.5–5.3)
Sodium: 141 mmol/L (ref 135–146)

## 2020-08-23 LAB — VITAMIN B12: Vitamin B-12: 557 pg/mL (ref 200–1100)

## 2020-08-27 LAB — DRUG TOX MONITOR 1 W/CONF, ORAL FLD
Alprazolam: NEGATIVE ng/mL (ref <0.50)
Amphetamines: NEGATIVE ng/mL (ref <10)
Barbiturates: NEGATIVE ng/mL (ref <10)
Benzodiazepines: POSITIVE ng/mL — AB (ref <0.50)
Buprenorphine: NEGATIVE ng/mL (ref <0.10)
Chlordiazepoxide: NEGATIVE ng/mL (ref <0.50)
Clonazepam: NEGATIVE ng/mL (ref <0.50)
Cocaine: NEGATIVE ng/mL (ref <5.0)
Diazepam: NEGATIVE ng/mL (ref <0.50)
Fentanyl: NEGATIVE ng/mL (ref <0.10)
Flunitrazepam: NEGATIVE ng/mL (ref <0.50)
Flurazepam: NEGATIVE ng/mL (ref <0.50)
Heroin Metabolite: NEGATIVE ng/mL (ref <1.0)
Lorazepam: NEGATIVE ng/mL (ref <0.50)
MARIJUANA: NEGATIVE ng/mL (ref <2.5)
MDMA: NEGATIVE ng/mL (ref <10)
Meprobamate: NEGATIVE ng/mL (ref <2.5)
Methadone: NEGATIVE ng/mL (ref <5.0)
Midazolam: NEGATIVE ng/mL (ref <0.50)
Nicotine Metabolite: NEGATIVE ng/mL (ref <5.0)
Nordiazepam: 0.75 ng/mL — ABNORMAL HIGH (ref <0.50)
Opiates: NEGATIVE ng/mL (ref <2.5)
Oxazepam: NEGATIVE ng/mL (ref <0.50)
Phencyclidine: NEGATIVE ng/mL (ref <10)
Tapentadol: NEGATIVE ng/mL (ref <5.0)
Temazepam: NEGATIVE ng/mL (ref <0.50)
Tramadol: 48.3 ng/mL — ABNORMAL HIGH (ref <5.0)
Tramadol: POSITIVE ng/mL — AB (ref <5.0)
Triazolam: NEGATIVE ng/mL (ref <0.50)
Zolpidem: NEGATIVE ng/mL (ref <5.0)

## 2020-08-30 ENCOUNTER — Other Ambulatory Visit: Payer: Self-pay | Admitting: Family Medicine

## 2020-08-30 ENCOUNTER — Telehealth: Payer: Self-pay | Admitting: Family

## 2020-08-30 NOTE — Telephone Encounter (Signed)
Patient advised of results and to continue medication. She verbalized understanding.

## 2020-08-30 NOTE — Telephone Encounter (Signed)
Overall, lab work looks good. Diabetes is at goal.  Cholesterol slightly above goal. Please continue pravastatin and low cholesterol diet.

## 2020-09-14 ENCOUNTER — Other Ambulatory Visit: Payer: Self-pay | Admitting: Family

## 2020-09-16 ENCOUNTER — Other Ambulatory Visit: Payer: Self-pay

## 2020-09-16 ENCOUNTER — Ambulatory Visit (HOSPITAL_COMMUNITY)
Admission: RE | Admit: 2020-09-16 | Discharge: 2020-09-16 | Disposition: A | Discharge status: Home / Self Care | Payer: Medicare Other | Source: Ambulatory Visit | Attending: Cardiovascular Disease | Admitting: Cardiovascular Disease

## 2020-09-16 DIAGNOSIS — R0989 Other specified symptoms and signs involving the circulatory and respiratory systems: Secondary | ICD-10-CM | POA: Diagnosis not present

## 2020-09-19 ENCOUNTER — Other Ambulatory Visit: Payer: Self-pay | Admitting: Family Medicine

## 2020-09-19 DIAGNOSIS — F419 Anxiety disorder, unspecified: Secondary | ICD-10-CM

## 2020-09-19 NOTE — Telephone Encounter (Signed)
Last written: 05/19/20 Last ov:  08/22/20 Next ov: 01/10/21 Contract: 08/22/20 UDS:08/22/20

## 2020-10-05 ENCOUNTER — Other Ambulatory Visit: Payer: Self-pay | Admitting: Family Medicine

## 2020-10-07 ENCOUNTER — Telehealth: Payer: Self-pay | Admitting: Family Medicine

## 2020-10-07 ENCOUNTER — Other Ambulatory Visit: Payer: Self-pay | Admitting: Family

## 2020-10-07 NOTE — Telephone Encounter (Signed)
Caller name: [name] Call back number: [phone]  [name] would like to speak with a doctor, assistant in regards to patient leg pain. She states this happened to her long time ago and she was taken off pravastatin and it got better. She is wondering if they should dry again?

## 2020-10-07 NOTE — Telephone Encounter (Signed)
Spoken with daughter and will call back within the next two weeks for patient status of whether or not not improvement.

## 2020-10-07 NOTE — Telephone Encounter (Signed)
OK then that is fine she can stop the Pravastatin then we can reevaluate at her next visit

## 2020-10-07 NOTE — Telephone Encounter (Signed)
She can stop Pravastatin for one month and see if it helps your pain. If it helps we will need an appt to discuss what to do next and if it does not help she should consider going back on the Pravastatin and let us know.

## 2020-10-07 NOTE — Telephone Encounter (Signed)
Patient requesting concerns if can take off of Pravastatin because of experiencing leg pain.  When she stopped in the past, patient states it stopped the pain.

## 2020-10-17 ENCOUNTER — Other Ambulatory Visit: Payer: Self-pay | Admitting: Family

## 2020-10-17 ENCOUNTER — Telehealth: Payer: Self-pay

## 2020-10-17 NOTE — Telephone Encounter (Signed)
She has had a bad reaction to Atorvastatin and Pravastatin so we should try Rosuvastatin which is water soluble where as the previous ones were fat soluble so this new one should not have as much side effects. Recommend she start a very low dose Rosuvastatin 5 mg po twice a week on Tuesday and Saturday and then recheck labs in January. If her pain returns she will have to stop the Rosuvastatin and then we will still recheck labs in January. If she has any other concerns we can see her sooner but just based on this

## 2020-10-17 NOTE — Telephone Encounter (Signed)
Pt daughter called back said mom has got minor relief from d/c of medication  But doesn't f/u with you until January should mom be seen ...get labs done or just wait to resume meds in January after her visit with you?

## 2020-10-18 NOTE — Telephone Encounter (Signed)
Message left on pt phone line with providers message to begin 5 mg dose of Rosuvastatin on Tues and Sat. Labs will be rechecked in Jan

## 2020-10-24 ENCOUNTER — Telehealth: Payer: Self-pay | Admitting: Pharmacist

## 2020-10-24 NOTE — Progress Notes (Addendum)
Chronic Care Management Pharmacy Assistant   Name: Dana Burns  MRN: [id number] DOB: [birth year]  Reason for Encounter: Medication Review/ General Adherence  Patient Questions:  1.  Have you seen any other providers since your last visit? No  2.  Any changes in your medicines or health? No    PCP : Mosie Lukes, MD  Allergies:   Allergies  Allergen Reactions  . Tape Itching and Rash  . Actos [Pioglitazone Hydrochloride] Other (See Comments)    Unknown  . Buspar [Buspirone Hcl] Other (See Comments)    Unknown  . Lipitor [Atorvastatin Calcium] Other (See Comments)    "It made me hurt all over."  . Penicillins Rash    Medications: Outpatient Encounter Medications as of 10/24/2020  Medication Sig  . calcium carbonate (OS-CAL) 600 MG TABS Take 600 mg by mouth 2 (two) times daily with a meal.    . Cholecalciferol (VITAMIN D3) 50 MCG (2000 UT) capsule Take 2,000 Units by mouth daily.   . citalopram (CELEXA) 10 MG tablet TAKE 1 TABLET BY MOUTH EVERY DAY  . Cyanocobalamin (B-12 IJ) Place 1,000 mcg under the tongue.   . diazepam (VALIUM) 5 MG tablet TAKE 1/2 TO 1 TABLET DAILY AS NEEDED FOR ANXIETY  . gabapentin (NEURONTIN) 800 MG tablet TAKE 1 TABLET BY MOUTH THREE TIMES A DAY  . metFORMIN (GLUCOPHAGE) 500 MG tablet Take 1 tablet (500 mg total) by mouth daily with breakfast.  . pantoprazole (PROTONIX) 40 MG tablet Take 1 tablet (40 mg total) by mouth daily.  . pravastatin (PRAVACHOL) 20 MG tablet TAKE 1 TABLET BY MOUTH EVERY DAY  . tiZANidine (ZANAFLEX) 2 MG tablet Take 0.5-1 tablets (1-2 mg total) by mouth 2 (two) times daily as needed for muscle spasms.  . traMADol (ULTRAM) 50 MG tablet TAKE 1 TABLET BY MOUTH EVERY 8 HOURS AS NEEDED FOR MODERATE PAIN. (INS COVERS 7 DAY SUPPLY)   No facility-administered encounter medications on file as of 10/24/2020.    Current Diagnosis: Patient Active Problem List   Diagnosis Date Noted  . Abdominal pain 09/29/2017  . Constipation  09/29/2017  . Pain in finger of right hand 05/15/2017  . Chronic pain syndrome 01/23/2015  . Vitamin B 12 deficiency 07/07/2014  . Pain of right shoulder region 07/07/2014  . Chicken pox   . Measles   . Peripheral neuropathy 07/01/2014  . Loss of weight 12/14/2013  . Fracture of wrist 12/14/2013  . Osteopenia 12/14/2013  . Anxiety state 09/03/2013  . Pain in joint, lower leg 09/03/2013  . Hyperlipidemia, mixed   . Fibromyalgia 06/01/2013  . Stress 06/01/2013  . Breast mass, right 10/14/2012  . Type 2 diabetes mellitus with peripheral neuropathy ([place]) 03/17/2011  . Osteoarthritis (arthritis due to wear and tear of joints) 03/17/2011  . Diabetic neuropathy ([place]) 03/17/2011  . Chronic renal insufficiency 03/17/2011  . History of colon cancer 03/17/2011  . GERD (gastroesophageal reflux disease) 03/17/2011  . Ductal hyperplasia of breast 03/17/2011  . Essential hypertension, benign 05/31/2010  . Tachycardia 05/31/2010  . SOB (shortness of breath) 05/31/2010    Goals Addressed   None    Patient stated if she has to stand up she has pain in right hip and runs down to her legs.  States her ankles are swollen.  States legs feels heavy.  States pain medicine does not help.  States tylenol does help. Patient states she has been trying to get an appointment with PCP but no appointments available.  States she will see a PA if needed.  Follow-Up:  Pharmacist Review   [place] Shannon, [facility] Pharmacist Assistant [phone]  Appears pt is scheduled with Dr. Charlett Blake on 12/05/20.   Reviewed by: De Blanch, PharmD Clinical Pharmacist [facility] [phone]

## 2020-10-28 ENCOUNTER — Telehealth: Payer: Self-pay | Admitting: *Deleted

## 2020-10-28 NOTE — Telephone Encounter (Signed)
Pt scheduled for first week in december

## 2020-10-28 NOTE — Telephone Encounter (Signed)
-----   Message from [place] N Shannon sent at 10/27/2020  5:04 PM EDT ----- Regarding: appointment Patient stated she would like to be seen before her January appointment due to pain in legs

## 2020-11-10 ENCOUNTER — Telehealth: Payer: Medicare Other

## 2020-11-10 NOTE — Chronic Care Management (AMB) (Deleted)
Chronic Care Management Pharmacy  Name: Dana Burns  MRN: [id number] DOB: 1926/11/14   Chief Complaint/ HPI  Dana Burns,  86 y.o. , female presents for their Follow-Up CCM visit with the clinical pharmacist via telephone due to COVID-19 Pandemic.  PCP : Mosie Lukes, MD  Their chronic conditions include: Diabetes, Hypertension, Depression/Anxiety, GERD, Neuropathy/Fibromyalgia, Pain  Office Visits: 10/17/20: Message from Dr. Charlett Blake for patient to change for pravastatin to rosuvastatin 5mg  twice weekly on Tuesday and Saturday. Recheck labs in January.   08/22/20: Visit w/ Debbrah Alar, NP - Decreased pedal pulses. Referred for ABI. No med changes noted.   Consult Visit: 09/16/20: Cardio visit w/ Dr. Oval Linsey - Ankle Brachial Indices  Medications: Outpatient Encounter Medications as of 11/10/2020  Medication Sig  . calcium carbonate (OS-CAL) 600 MG TABS Take 600 mg by mouth 2 (two) times daily with a meal.    . Cholecalciferol (VITAMIN D3) 50 MCG (2000 UT) capsule Take 2,000 Units by mouth daily.   . citalopram (CELEXA) 10 MG tablet TAKE 1 TABLET BY MOUTH EVERY [name]  . Cyanocobalamin (B-12 IJ) Place 1,000 mcg under the tongue.   . diazepam (VALIUM) 5 MG tablet TAKE 1/2 TO 1 TABLET DAILY AS NEEDED FOR ANXIETY  . gabapentin (NEURONTIN) 800 MG tablet TAKE 1 TABLET BY MOUTH THREE TIMES A May Ozment  . metFORMIN (GLUCOPHAGE) 500 MG tablet Take 1 tablet (500 mg total) by mouth daily with breakfast.  . pantoprazole (PROTONIX) 40 MG tablet Take 1 tablet (40 mg total) by mouth daily.  . pravastatin (PRAVACHOL) 20 MG tablet TAKE 1 TABLET BY MOUTH EVERY [name]  . tiZANidine (ZANAFLEX) 2 MG tablet Take 0.5-1 tablets (1-2 mg total) by mouth 2 (two) times daily as needed for muscle spasms.  . traMADol (ULTRAM) 50 MG tablet TAKE 1 TABLET BY MOUTH EVERY 8 HOURS AS NEEDED FOR MODERATE PAIN. (INS COVERS 7 [name] SUPPLY)   No facility-administered encounter medications on file as of 11/10/2020.    SDOH Screenings   Alcohol Screen:   . Last Alcohol Screening Score (AUDIT): Not on file  Depression (PHQ2-9): Medium Risk  . PHQ-2 Score: 17  Financial Resource Strain:   . Difficulty of Paying Living Expenses: Not on file  Food Insecurity:   . Worried About Charity fundraiser in the Last Year: Not on file  . Ran Out of Food in the Last Year: Not on file  Housing:   . Last Housing Risk Score: Not on file  Physical Activity:   . Days of Exercise per Week: Not on file  . Minutes of Exercise per Session: Not on file  Social Connections:   . Frequency of Communication with Friends and Family: Not on file  . Frequency of Social Gatherings with Friends and Family: Not on file  . Attends Religious Services: Not on file  . Active Member of Clubs or Organizations: Not on file  . Attends Archivist Meetings: Not on file  . Marital Status: Not on file  Stress:   . Feeling of Stress : Not on file  Tobacco Use: Low Risk   . Smoking Tobacco Use: Never Smoker  . Smokeless Tobacco Use: Never Used  Transportation Needs:   . Film/video editor (Medical): Not on file  . Lack of Transportation (Non-Medical): Not on file    Current Diagnosis/Assessment:  Goals Addressed   None   Social Hx:  Has 4 brothers and they all passed away. 3 died from cancer  and 1 from heart attack.  Has 3 sisters that are still alive and 3 sisters died.  Has a daughter who lives with her. Daughter's husband died from cancer. First husband passed away, she remarried and second husband passed away also. She has 2 grandchildren and 2 great grandchildren. All boys.  Goes to church in [place].  Depression/Anxiety   Depression screen [facility] 2/9 07/26/2020 05/14/2017 02/11/2017  Decreased Interest 3 0 0  Down, Depressed, Hopeless 2 0 0  PHQ - 2 Score 5 0 0  Altered sleeping 1 - -  Tired, decreased energy 3 - -  Change in appetite 1 - -  Feeling bad or failure about yourself  2 - -  Trouble  concentrating 2 - -  Moving slowly or fidgety/restless 3 - -  Suicidal thoughts 0 - -  PHQ-9 Score 17 - -  Some recent data might be hidden    Patient has failed these meds in past: Buspar (listed in allergies), cymbalta and lyrica (listed as not tolerated in [place] neuro note from 07/01/14) Patient is currently uncontrolled on the following medications:   Citalopram 10mg  daily  Diazepam 5mg  1/2 tab as needed  Problem Story We discussed:  That pain may be the largest contributor to her depression symptoms. She would like to address the pain before considering change in depression regimen  Update 08/10/20 To be evaluated for pain 08/23/20. Will assess further after pain evaluation.  Update 11/10/20  We discussed:  ***  Plan  Continue {[facility] [id number]}   Future Plan -Consider increasing citalopram, switching to different SSRI, or switching to SNRI to help with pain.  Neuropathy/Fibromyalgia    Patient has failed these meds in past: Savella (swelling?), lyrica (listed as not tolerated in [place] neuro note from 07/01/14) Patient is currently uncontrolled on the following medications:   Gabapentin 800mg  TID  Update 08/02/20 Neuropathy pain vs osteoarthritic pain? Will provide recommendation for osteoarthritic pain to see if pain is lessened.  Patient is anxious to have follow up with Dr. Charlett Blake to discuss her pain.  Kidney Function Lab Results  Component Value Date/Time   CREATININE 1.47 (H) 08/22/2020 01:21 PM   CREATININE 1.44 (H) 05/19/2020 02:25 PM   CREATININE 1.35 (H) 04/12/2020 01:55 PM   CREATININE 1.44 (H) 11/13/2016 02:44 PM   GFR 34.03 (L) 05/19/2020 02:25 PM   GFRNONAA 32 (L) 11/13/2016 02:44 PM   GFRAA 37 (L) 11/13/2016 02:44 PM   K 4.9 08/22/2020 01:21 PM   K 4.6 05/19/2020 02:25 PM   TBW (total body weight) = Unable to assess IBW (ideal body weight) = 45.5kg +2.3kg (6) = 59.3kg CrCl = [(140-92) X (59.3)]/(72 x 1.44) = 27.4  mL/min  Recommended dose of gabapentin for CrCl of 15-29 is 600mg /[name] is 1 to 2 divided doses.  Patient states she tried to stop gabapentin before and she felt like she was going crazy.   Update 08/10/20 She states she is still having pain. She wonders if it is due to poor circulation. She would like to find out the cause of her pain.  She states she can't stand long without having pain. Possible intermittent claudication? Pt states she has not had her foot circulation evaluated before.  Update 11/10/20   Plan -Continue current medications   Future Plan -Consider titrating down/off of gabapentin and starting alternative such as amitriptyline (do not normally recommend amitriptyline in the elderly due to anticholinergic effects, but pt is currently on gabapentin and has not  tolerated cymbalta, savella, and lyrica.  Also no renal adjustment needed) -Recommend starting amitriptyline 10mg  daily HS if agreeable with Dr. Charlett Blake  -Consider evaluation for intermittent claudication? Possible rx for cilostazol?   Pain    Patient has failed these meds in past: None noted  Patient is currently uncontrolled on the following medications:  Tramadol 50mg  every 8 hours as needed  Tizanidine 2mg  1/2 to 1 tab twice daily as needed for muscle spasm (doesn't have refills)  She states she has significant pain in her legs and feet. States the pain is aching, burning, stabbing, etc. She states the pain starts in her back, then goes down to her hip into her feet. She was in so much pain she only slept for about 2 hours last night  Felt the methylpredisolone was helpful. Has been in pain for a year.   Patient has tylenol 500mg  at home. States she got scared about using tylenol in the past due to concern for liver.   States she has swelling in her ankles. In the R>L. Can't wear shoes due to this.  Reports she has SOB.   We discussed:  We discussed the max dose of tylenol is 4000mg  per [employer] noting that  1500mg  per Carinna Newhart is not even half of this limit. Her lfts are WNL. We also discussed how scheduled pain medicine can be helpful vs "chasing pain".  Update 08/02/20 She did start taking tylenol TID.  States that sometimes she feels the tylenol helps, but at other times it doesn't. She said the first 2 days it helped, but after that not as much. Feels riding in the car to church (~45 min drive) causes her to hurt more.  She rubs aspercreme and alcohol on her legs daily.  Update 08/10/20 Still having pain. She looks forward to being seen for further evaluation.  Update 11/10/20  Plan -Continue current medications  -Follow up at visit on 08/23/20   Melvenia Beam Jeromey Kruer, PharmD Clinical Pharmacist [facility] [phone]

## 2020-12-05 ENCOUNTER — Telehealth (INDEPENDENT_AMBULATORY_CARE_PROVIDER_SITE_OTHER): Payer: Medicare Other | Admitting: Family Medicine

## 2020-12-05 ENCOUNTER — Other Ambulatory Visit: Payer: Self-pay

## 2020-12-05 DIAGNOSIS — I10 Essential (primary) hypertension: Secondary | ICD-10-CM

## 2020-12-05 DIAGNOSIS — G894 Chronic pain syndrome: Secondary | ICD-10-CM

## 2020-12-05 DIAGNOSIS — F411 Generalized anxiety disorder: Secondary | ICD-10-CM | POA: Diagnosis not present

## 2020-12-05 DIAGNOSIS — N189 Chronic kidney disease, unspecified: Secondary | ICD-10-CM

## 2020-12-05 DIAGNOSIS — E1142 Type 2 diabetes mellitus with diabetic polyneuropathy: Secondary | ICD-10-CM | POA: Diagnosis not present

## 2020-12-05 DIAGNOSIS — F419 Anxiety disorder, unspecified: Secondary | ICD-10-CM | POA: Diagnosis not present

## 2020-12-05 DIAGNOSIS — E538 Deficiency of other specified B group vitamins: Secondary | ICD-10-CM

## 2020-12-05 MED ORDER — TIZANIDINE HCL 4 MG PO TABS: 4.0000 mg | ORAL_TABLET | Freq: Two times a day (BID) | ORAL | 2 refills | Status: DC | PRN

## 2020-12-05 MED ORDER — CITALOPRAM HYDROBROMIDE 20 MG PO TABS: 20.0000 mg | ORAL_TABLET | Freq: Every day | ORAL | 3 refills | Status: DC

## 2020-12-05 MED ORDER — DIAZEPAM 5 MG PO TABS: ORAL_TABLET | ORAL | 3 refills | Status: DC

## 2020-12-05 NOTE — Assessment & Plan Note (Signed)
She notes daily pain which is debilitating. Continue gabapentin at same dose. The Tizanidine at 2 mg bid has helped some without side effects. Will try increasing to 4 mg bid and will stop Tramadol which was not helpful. Use Tylenol up to TID prn for pain and stay as active as able.

## 2020-12-05 NOTE — Assessment & Plan Note (Signed)
She is using Diazepam 2.5 mg most days and Citalopram 10 mg daily with only partial results so she agrees to increase Citalopram 20 mg daily

## 2020-12-05 NOTE — Progress Notes (Signed)
Virtual Visit via Office Note  I connected with Dana Burns on 12/05/20 at  3:20 PM EST by a office enabled telemedicine application and verified that I am speaking with the correct person using two identifiers.  Location: Patient: home, patient and provider in visit Provider: office   I discussed the limitations of evaluation and management by telemedicine and the availability of in person appointments. The patient expressed understanding and agreed to proceed. A Mikey Bussing, LPN was able to get the patient set up on a phone visit after patient was unable to perform a video visit    Subjective:    Patient ID: Dana Burns, female    DOB: 10/19/1926, 87 y.o.   MRN: [id number]  Chief Complaint  Patient presents with  . Follow-up    legs and spasms     HPI Patient is in today for follow upon chronic medical concerns. She was supposed to come in for her visit today but her daughter has to drive her and her daughter is sick with significant diarrhea and was unable to bring her. The patient notes her stress and anxiety levels continue to be high despite meds. No recent febrile illness or hospitalizations. She is mostly struggling with myalgias, back pain and debilitating pain in ankles and feet. It is present constantly but changes in intensity. Tramadol was not very helpful so she is using Tylenol with better results.Tizanidine had no side effects and did help a little. Denies CP/palp/SOB/HA/congestion/fevers/GI or GU c/o. Taking meds as prescribed  Past Medical History:  Diagnosis Date  . Allergy    sneeze, rhinorrhea in am  . Chicken pox as a child  . Chronic pain syndrome 01/23/2015  . Colon cancer ([facility])   . Complication of anesthesia    agitated when waking up, wakes up shaking, "like  I'm going into shock"  . Depression    husband died  3 months ago, pt. admits depression  currently   . Diabetes mellitus    6 years  . DM (diabetes mellitus) type 2, uncontrolled, with ketoacidosis  ([facility]) 03/17/2011  . Fibromyalgia   . GERD (gastroesophageal reflux disease)   . H/O echocardiogram    done /w [place] in 2011, saw Dr. Percival Spanish for tachycardia   . Headache(784.0)    occasional - "bad headaches"  . Hyperlipidemia    6 years  . Hypertension    15 years, reports she has a rapid heartrate   . Measles as a child  . Osteoarthritis    in hands & all over, feet  . Pain in finger of right hand 05/15/2017  . Pain of right shoulder region 07/07/2014  . Peripheral neuropathy   . Shortness of breath   . Type 2 diabetes mellitus with peripheral neuropathy ([facility]) 03/17/2011    Past Surgical History:  Procedure Laterality Date  . BREAST EXCISIONAL BIOPSY Right 2013   benign  . BREAST SURGERY  2013   right- benign  . CATARACT EXTRACTION     /w IOL- bilateral   . COLON SURGERY     For resection of cancer  . Nodule removed     From throat    Family History  Problem Relation Age of Onset  . Cancer Mother        Brain  . Cancer Father        Colorectal  . Cancer Sister        breast  . Other Brother        pacemaker  .  Cancer Brother        pancreatic cancer  . Arthritis Sister   . Emphysema Brother   . Cancer Brother   . COPD Brother   . Neuropathy Daughter   . Fibromyalgia Daughter   . Cancer Brother        metastatic colon cancer  . Heart disease Neg Hx        Early    Social History   Socioeconomic History  . Marital status: Widowed    Spouse name: Not on file  . Number of children: 1  . Years of education: 19  . Highest education level: Not on file  Occupational History  . Occupation: Retired  Tobacco Use  . Smoking status: Never Smoker  . Smokeless tobacco: Never Used  Substance and Sexual Activity  . Alcohol use: No  . Drug use: No  . Sexual activity: Never    Comment: lives with daughter. no dietary restrictions  Other Topics Concern  . Not on file  Social History Narrative   Patient lives at home with daughter.    Patient is retired.     Patient is widowed.    Patient has 1 child.    Patient has a 9th grade education.    Social Determinants of Health   Financial Resource Strain:   . Difficulty of Paying Living Expenses: Not on file  Food Insecurity:   . Worried About Charity fundraiser in the Last Year: Not on file  . Ran Out of Food in the Last Year: Not on file  Transportation Needs:   . Lack of Transportation (Medical): Not on file  . Lack of Transportation (Non-Medical): Not on file  Physical Activity:   . Days of Exercise per Week: Not on file  . Minutes of Exercise per Session: Not on file  Stress:   . Feeling of Stress : Not on file  Social Connections:   . Frequency of Communication with Friends and Family: Not on file  . Frequency of Social Gatherings with Friends and Family: Not on file  . Attends Religious Services: Not on file  . Active Member of Clubs or Organizations: Not on file  . Attends Archivist Meetings: Not on file  . Marital Status: Not on file  Intimate Partner Violence:   . Fear of Current or Ex-Partner: Not on file  . Emotionally Abused: Not on file  . Physically Abused: Not on file  . Sexually Abused: Not on file    Outpatient Medications Prior to Visit  Medication Sig Dispense Refill  . calcium carbonate (OS-CAL) 600 MG TABS Take 600 mg by mouth 2 (two) times daily with a meal.      . Cholecalciferol (VITAMIN D3) 50 MCG (2000 UT) capsule Take 2,000 Units by mouth daily.     . Cyanocobalamin (B-12 IJ) Place 1,000 mcg under the tongue.     . gabapentin (NEURONTIN) 800 MG tablet TAKE 1 TABLET BY MOUTH THREE TIMES A DAY 270 tablet 1  . metFORMIN (GLUCOPHAGE) 500 MG tablet Take 1 tablet (500 mg total) by mouth daily with breakfast. 90 tablet 1  . pantoprazole (PROTONIX) 40 MG tablet Take 1 tablet (40 mg total) by mouth daily. 90 tablet 3  . traMADol (ULTRAM) 50 MG tablet TAKE 1 TABLET BY MOUTH EVERY 8 HOURS AS NEEDED FOR MODERATE PAIN. (INS COVERS 7 DAY SUPPLY) 45 tablet 0    . citalopram (CELEXA) 10 MG tablet TAKE 1 TABLET BY MOUTH EVERY DAY  90 tablet 0  . diazepam (VALIUM) 5 MG tablet TAKE 1/2 TO 1 TABLET DAILY AS NEEDED FOR ANXIETY 30 tablet 1  . pravastatin (PRAVACHOL) 20 MG tablet TAKE 1 TABLET BY MOUTH EVERY DAY 90 tablet 3  . tiZANidine (ZANAFLEX) 2 MG tablet Take 0.5-1 tablets (1-2 mg total) by mouth 2 (two) times daily as needed for muscle spasms. 30 tablet 1   No facility-administered medications prior to visit.    Allergies  Allergen Reactions  . Tape Itching and Rash  . Actos [Pioglitazone Hydrochloride] Other (See Comments)    Unknown  . Buspar [Buspirone Hcl] Other (See Comments)    Unknown  . Lipitor [Atorvastatin Calcium] Other (See Comments)    "It made me hurt all over."  . Penicillins Rash    Review of Systems  Constitutional: Positive for malaise/fatigue. Negative for fever.  HENT: Negative for congestion.   Eyes: Negative for blurred vision.  Respiratory: Negative for shortness of breath.   Cardiovascular: Negative for chest pain, palpitations and leg swelling.  Gastrointestinal: Negative for abdominal pain, blood in stool and nausea.  Genitourinary: Negative for dysuria and frequency.  Musculoskeletal: Positive for back pain, joint pain and myalgias. Negative for falls.  Skin: Negative for rash.  Neurological: Negative for dizziness, loss of consciousness and headaches.  Endo/Heme/Allergies: Negative for environmental allergies.  Psychiatric/Behavioral: Positive for depression. The patient is nervous/anxious.        Objective:    Physical Exam Unable to obtain via phone  There were no vitals taken for this visit. Wt Readings from Last 3 Encounters:  08/22/20 142 lb (64.4 kg)  03/16/20 145 lb (65.8 kg)  02/13/19 142 lb 9.6 oz (64.7 kg)    Diabetic Foot Exam - Simple   No data filed     Lab Results  Component Value Date   WBC 5.1 04/12/2020   HGB 12.6 04/12/2020   HCT 38.5 04/12/2020   PLT 236.0 04/12/2020    GLUCOSE 99 08/22/2020   CHOL 186 08/22/2020   TRIG 162 (H) 08/22/2020   HDL 40 (L) 08/22/2020   LDLDIRECT 120.0 04/12/2020   LDLCALC 118 (H) 08/22/2020   ALT 7 05/19/2020   AST 15 05/19/2020   NA 141 08/22/2020   K 4.9 08/22/2020   CL 102 08/22/2020   CREATININE 1.47 (H) 08/22/2020   BUN 21 08/22/2020   CO2 30 08/22/2020   TSH 2.24 10/06/2019   HGBA1C 6.4 (H) 08/22/2020   MICROALBUR 1.7 02/13/2019    Lab Results  Component Value Date   TSH 2.24 10/06/2019   Lab Results  Component Value Date   WBC 5.1 04/12/2020   HGB 12.6 04/12/2020   HCT 38.5 04/12/2020   MCV 83.2 04/12/2020   PLT 236.0 04/12/2020   Lab Results  Component Value Date   NA 141 08/22/2020   K 4.9 08/22/2020   CO2 30 08/22/2020   GLUCOSE 99 08/22/2020   BUN 21 08/22/2020   CREATININE 1.47 (H) 08/22/2020   BILITOT 0.3 05/19/2020   ALKPHOS 64 05/19/2020   AST 15 05/19/2020   ALT 7 05/19/2020   PROT 6.6 05/19/2020   ALBUMIN 4.2 05/19/2020   CALCIUM 9.8 08/22/2020   GFR 34.03 (L) 05/19/2020   Lab Results  Component Value Date   CHOL 186 08/22/2020   Lab Results  Component Value Date   HDL 40 (L) 08/22/2020   Lab Results  Component Value Date   LDLCALC 118 (H) 08/22/2020   Lab Results  Component Value  Date   TRIG 162 (H) 08/22/2020   Lab Results  Component Value Date   CHOLHDL 4.7 08/22/2020   Lab Results  Component Value Date   HGBA1C 6.4 (H) 08/22/2020       Assessment & Plan:   Problem List Items Addressed This Visit    Essential hypertension, benign    Monitor and report any concerns, no changes to meds. Encouraged heart healthy diet such as the DASH diet and exercise as tolerated.       Type 2 diabetes mellitus with peripheral neuropathy (HCC)    hgba1c acceptable, minimize simple carbs. Increase exercise as tolerated. Continue current meds      Relevant Medications   tiZANidine (ZANAFLEX) 4 MG tablet   diazepam (VALIUM) 5 MG tablet   citalopram (CELEXA) 20 MG  tablet   Chronic renal insufficiency    Hydrate and monitor      Anxiety state    She is using Diazepam 2.5 mg most days and Citalopram 10 mg daily with only partial results so she agrees to increase Citalopram 20 mg daily      Relevant Medications   diazepam (VALIUM) 5 MG tablet   citalopram (CELEXA) 20 MG tablet   Vitamin B 12 deficiency    Supplement and monitor      Chronic pain syndrome    She notes daily pain which is debilitating. Continue gabapentin at same dose. The Tizanidine at 2 mg bid has helped some without side effects. Will try increasing to 4 mg bid and will stop Tramadol which was not helpful. Use Tylenol up to TID prn for pain and stay as active as able.       Relevant Medications   tiZANidine (ZANAFLEX) 4 MG tablet   citalopram (CELEXA) 20 MG tablet    Other Visit Diagnoses    Anxiety       Relevant Medications   diazepam (VALIUM) 5 MG tablet   citalopram (CELEXA) 20 MG tablet      I have discontinued Carolyna M. Mckeever's pravastatin, citalopram, and tiZANidine. I am also having her start on tiZANidine and citalopram. Additionally, I am having her maintain her calcium carbonate, Vitamin D3, Cyanocobalamin (B-12 IJ), pantoprazole, gabapentin, traMADol, metFORMIN, and diazepam.  Meds ordered this encounter  Medications  . tiZANidine (ZANAFLEX) 4 MG tablet    Sig: Take 1 tablet (4 mg total) by mouth 2 (two) times daily as needed for muscle spasms.    Dispense:  60 tablet    Refill:  2  . diazepam (VALIUM) 5 MG tablet    Sig: TAKE 1/2 TO 1 TABLET DAILY AS NEEDED FOR ANXIETY    Dispense:  30 tablet    Refill:  3    Not to exceed 4 additional fills before 11/15/2020 DX Code Needed  REQUEST REFILL.  . citalopram (CELEXA) 20 MG tablet    Sig: Take 1 tablet (20 mg total) by mouth daily.    Dispense:  30 tablet    Refill:  3     I discussed the assessment and treatment plan with the patient. The patient was provided an opportunity to ask questions and all were  answered. The patient agreed with the plan and demonstrated an understanding of the instructions.   The patient was advised to call back or seek an in-person evaluation if the symptoms worsen or if the condition fails to improve as anticipated.  I provided 25 minutes of non-face-to-face time during this encounter.   Penni Homans, MD

## 2020-12-05 NOTE — Assessment & Plan Note (Signed)
Hydrate and monitor 

## 2020-12-05 NOTE — Assessment & Plan Note (Signed)
hgba1c acceptable, minimize simple carbs. Increase exercise as tolerated. Continue current meds 

## 2020-12-05 NOTE — Assessment & Plan Note (Signed)
Monitor and report any concerns, no changes to meds. Encouraged heart healthy diet such as the DASH diet and exercise as tolerated.  ?

## 2020-12-05 NOTE — Assessment & Plan Note (Signed)
Supplement and monitor 

## 2020-12-07 DIAGNOSIS — E119 Type 2 diabetes mellitus without complications: Secondary | ICD-10-CM | POA: Diagnosis not present

## 2020-12-27 NOTE — Progress Notes (Addendum)
Healthcare at [facility] [address], [address] [place], [place] [address] [phone]  Date:  12/28/2020   Name:  Dana Burns   DOB:  August 24, 1926   MRN:  [id number]  PCP:  Bradd Canary, MD    Chief Complaint: Hypertension (BP has been dropping. Has gotten as low as 64/ 46 with a HR of 62 on Christmas day. /She has also been having leg and neck pain- She has been out of Tramadol.)   History of Present Illness:  Dana Burns is a 86 y.o. very pleasant female patient who presents with the following:  Continuity patient of my partner Dr. Abner Greenspan, here today with concern of blood pressure problem  I have not seen this patient myself in the past Her today with her daughter Devon  Most recent visit by Dr. Abner Greenspan December 6, virtual visit to discuss chronic concerns She had concern of anxiety, myalgias/back pain/pain in feet and ankles At that time they increased her citalopram from 10 to 20 mg.  Continue using diazepam daily Dr. Abner Greenspan tried increasing her tizanidine dose, stopped tramadol as it was not helpful She notes she has had pain in her back, feet and legs all year This morning she had left arm pain but this is now resolved Also notes right hip apin  No CP or SOB She does not wear oxygen at home - on recheck O2 sat normal here today   COVID-19 vaccine- encouraged booster  Flu shot- done  Can offer A1c today if she likes Lab Results  Component Value Date   HGBA1C 6.5 12/28/2020   They checked her BP over Christmas as she was feeling lightheaded  64/46 was lowest BP noted  She notes that she felt lightheaded a couple of times yesterday   She is no longer taking tramadol They are using tizanidine but she does not feel like it really helps- she would be ok with stopping it  She is taking gabapentin TID   Patient Active Problem List   Diagnosis Date Noted  . Abdominal pain 09/29/2017  . Constipation 09/29/2017  . Pain in finger  of right hand 05/15/2017  . Chronic pain syndrome 01/23/2015  . Vitamin B 12 deficiency 07/07/2014  . Pain of right shoulder region 07/07/2014  . Chicken pox   . Measles   . Peripheral neuropathy 07/01/2014  . Loss of weight 12/14/2013  . Fracture of wrist 12/14/2013  . Osteopenia 12/14/2013  . Anxiety state 09/03/2013  . Pain in joint, lower leg 09/03/2013  . Hyperlipidemia, mixed   . Fibromyalgia 06/01/2013  . Stress 06/01/2013  . Breast mass, right 10/14/2012  . Type 2 diabetes mellitus with peripheral neuropathy (HCC) 03/17/2011  . Osteoarthritis (arthritis due to wear and tear of joints) 03/17/2011  . Diabetic neuropathy (HCC) 03/17/2011  . Chronic renal insufficiency 03/17/2011  . History of colon cancer 03/17/2011  . GERD (gastroesophageal reflux disease) 03/17/2011  . Ductal hyperplasia of breast 03/17/2011  . Essential hypertension, benign 05/31/2010  . Tachycardia 05/31/2010  . SOB (shortness of breath) 05/31/2010    Past Medical History:  Diagnosis Date  . Allergy    sneeze, rhinorrhea in am  . Chicken pox as a child  . Chronic pain syndrome 01/23/2015  . Colon cancer ([facility])   . Complication of anesthesia    agitated when waking up, wakes up shaking, "like  I'm going into shock"  . Depression    husband  died  3 months ago, pt. admits depression  currently   . Diabetes mellitus    6 years  . DM (diabetes mellitus) type 2, uncontrolled, with ketoacidosis ([facility]) 03/17/2011  . Fibromyalgia   . GERD (gastroesophageal reflux disease)   . H/O echocardiogram    done /w [place] in 2011, saw Dr. Antoine Poche for tachycardia   . Headache(784.0)    occasional - "bad headaches"  . Hyperlipidemia    6 years  . Hypertension    15 years, reports she has a rapid heartrate   . Measles as a child  . Osteoarthritis    in hands & all over, feet  . Pain in finger of right hand 05/15/2017  . Pain of right shoulder region 07/07/2014  . Peripheral neuropathy   . Shortness of breath    . Type 2 diabetes mellitus with peripheral neuropathy (HCC) 03/17/2011    Past Surgical History:  Procedure Laterality Date  . BREAST EXCISIONAL BIOPSY Right 2013   benign  . BREAST SURGERY  2013   right- benign  . CATARACT EXTRACTION     /w IOL- bilateral   . COLON SURGERY     For resection of cancer  . Nodule removed     From throat    Social History   Tobacco Use  . Smoking status: Never Smoker  . Smokeless tobacco: Never Used  Substance Use Topics  . Alcohol use: No  . Drug use: No    Family History  Problem Relation Age of Onset  . Cancer Mother        Brain  . Cancer Father        Colorectal  . Cancer Sister        breast  . Other Brother        pacemaker  . Cancer Brother        pancreatic cancer  . Arthritis Sister   . Emphysema Brother   . Cancer Brother   . COPD Brother   . Neuropathy Daughter   . Fibromyalgia Daughter   . Cancer Brother        metastatic colon cancer  . Heart disease Neg Hx        Early    Allergies  Allergen Reactions  . Tape Itching and Rash  . Actos [Pioglitazone Hydrochloride] Other (See Comments)    Unknown  . Buspar [Buspirone Hcl] Other (See Comments)    Unknown  . Lipitor [Atorvastatin Calcium] Other (See Comments)    "It made me hurt all over."  . Penicillins Rash    Medication list has been reviewed and updated.  Current Outpatient Medications on File Prior to Visit  Medication Sig Dispense Refill  . calcium carbonate (OS-CAL) 600 MG TABS Take 600 mg by mouth 2 (two) times daily with a meal.    . Cholecalciferol (VITAMIN D3) 50 MCG (2000 UT) capsule Take 2,000 Units by mouth daily.     . citalopram (CELEXA) 20 MG tablet Take 1 tablet (20 mg total) by mouth daily. 30 tablet 3  . Cyanocobalamin (B-12 IJ) Place 1,000 mcg under the tongue.     . diazepam (VALIUM) 5 MG tablet TAKE 1/2 TO 1 TABLET DAILY AS NEEDED FOR ANXIETY 30 tablet 3  . gabapentin (NEURONTIN) 800 MG tablet TAKE 1 TABLET BY MOUTH THREE TIMES A  DAY 270 tablet 1  . metFORMIN (GLUCOPHAGE) 500 MG tablet Take 1 tablet (500 mg total) by mouth daily with breakfast. 90 tablet 1  .  pantoprazole (PROTONIX) 40 MG tablet Take 1 tablet (40 mg total) by mouth daily. 90 tablet 3  . tiZANidine (ZANAFLEX) 4 MG tablet Take 1 tablet (4 mg total) by mouth 2 (two) times daily as needed for muscle spasms. 60 tablet 2   No current facility-administered medications on file prior to visit.    Review of Systems:  As per HPI- otherwise negative.  Wt Readings from Last 3 Encounters:  12/28/20 140 lb 9.6 oz (63.8 kg)  08/22/20 142 lb (64.4 kg)  03/16/20 145 lb (65.8 kg)      Physical Examination: Vitals:   12/28/20 1037  BP: (!) 142/72  Pulse: 97  Resp: 18  SpO2: 99%   Vitals:   12/28/20 1037  Weight: 140 lb 9.6 oz (63.8 kg)   Body mass index is 22.35 kg/m. Ideal Body Weight:    GEN: no acute distress. Normal weight, elderly lady who looks well  HEENT: Atraumatic, Normocephalic.  Ears and Nose: No external deformity. CV: RRR, No M/G/R. No JVD. No thrill. No extra heart sounds. PULM: CTA B, no wheezes, crackles, rhonchi. No retractions. No resp. distress. No accessory muscle use. ABD: S, NT, ND, +BS. No rebound. No HSM. EXTR: No c/c/e Uses cane to walk Pt noes tenderness of her thighs, shins, arms bilaterally I do not appreciate any swelling or redness, no sign of cellulitis or compartment syndrome  PSYCH: Normally interactive. Conversant.    Assessment and Plan: Generalized pain - Plan: CBC, Comprehensive metabolic panel, Sedimentation rate, Rheumatoid Factor, CK, acetaminophen-codeine (TYLENOL #3) 300-30 MG tablet  Pain in shin, unspecified laterality - Plan: DG Tibia/Fibula Left, DG Tibia/Fibula Right  Pain in joint of right hip - Plan: DG Hip Unilat W OR W/O Pelvis 1V Right, DG Lumbar Spine Complete  Hypotension, unspecified hypotension type  Essential hypertension, benign  Type 2 diabetes mellitus with peripheral  neuropathy (HCC) - Plan: Hemoglobin A1c  Pt here today with concern of generalized pain over her body for a year or longer Will obtain some plain films of the most troublesome areas for her today   Will also obtain labs as above- ?possible autoimmune or muscle breakdown issue She would like to try tylenol #3 - rx for her today Will taper her off tizantidine as it does not seem to be helpful Cautioned that tylenol #3 may cause sedation She will let myself or Dr B know how this works for her   This visit occurred during the SARS-CoV-2 public health emergency.  Safety protocols were in place, including screening questions prior to the visit, additional usage of staff PPE, and extensive cleaning of exam room while observing appropriate contact time as indicated for disinfecting solutions.    Signed Abbe Amsterdam, MD  Received her labs as below- letter to pt   Results for orders placed or performed in visit on 12/28/20  CBC  Result Value Ref Range   WBC 4.6 4.0 - 10.5 K/uL   RBC 4.83 3.87 - 5.11 Mil/uL   Platelets 249.0 150.0 - 400.0 K/uL   Hemoglobin 13.1 12.0 - 15.0 g/dL   HCT 01.0 93.2 - 35.5 %   MCV 84.3 78.0 - 100.0 fl   MCHC 32.2 30.0 - 36.0 g/dL   RDW 73.2 (H) 20.2 - 54.2 %  Comprehensive metabolic panel  Result Value Ref Range   Sodium 140 135 - 145 mEq/L   Potassium 4.8 3.5 - 5.1 mEq/L   Chloride 102 96 - 112 mEq/L   CO2 32 19 -  32 mEq/L   Glucose, Bld 105 (H) 70 - 99 mg/dL   BUN 24 (H) 6 - 23 mg/dL   Creatinine, Ser 1.37 (H) 0.40 - 1.20 mg/dL   Total Bilirubin 0.4 0.2 - 1.2 mg/dL   Alkaline Phosphatase 61 39 - 117 U/L   AST 16 0 - 37 U/L   ALT 9 0 - 35 U/L   Total Protein 7.0 6.0 - 8.3 g/dL   Albumin 4.4 3.5 - 5.2 g/dL   GFR 33.46 (L) >60.00 mL/min   Calcium 9.9 8.4 - 10.5 mg/dL  Hemoglobin A1c  Result Value Ref Range   Hgb A1c MFr Bld 6.5 4.6 - 6.5 %  Sedimentation rate  Result Value Ref Range   Sed Rate 38 (H) 0 - 30 mm/hr  Rheumatoid Factor  Result  Value Ref Range   Rhuematoid fact SerPl-aCnc <14 <14 IU/mL  CK  Result Value Ref Range   Total CK 48 7 - 177 U/L    Also received her x-ray reports on 12/20- message to pt  DG Lumbar Spine Complete  Result Date: 12/28/2020 CLINICAL DATA:  Chronic low back pain, right hip pain, and bilateral lower leg pain for 1 year. No known injury. EXAM: LUMBAR SPINE - COMPLETE 4+ VIEW COMPARISON:  None. FINDINGS: Five lumbar type vertebra. Normal alignment. No vertebral compression. Mild diffuse bone demineralization. Degenerative changes throughout with narrowed interspaces and endplate hypertrophic change. Degenerative changes in the lower lumbar facet joints. No vertebral compression deformities. No focal bone lesion or bone destruction. Vascular calcifications. Surgical clips in the pelvis. IMPRESSION: Degenerative changes in the lumbar spine. No acute displaced fractures identified. Electronically Signed   By: Lucienne Capers M.D.   On: 12/28/2020 18:36   DG Tibia/Fibula Left  Result Date: 12/28/2020 CLINICAL DATA:  Chronic low back pain, right hip pain, and bilateral lower leg pain for 1 year. No known injury. EXAM: LEFT TIBIA AND FIBULA - 2 VIEW COMPARISON:  None. FINDINGS: There is no evidence of fracture or other focal bone lesions. Soft tissues are unremarkable. IMPRESSION: Negative. Electronically Signed   By: Lucienne Capers M.D.   On: 12/28/2020 18:37   DG Tibia/Fibula Right  Result Date: 12/28/2020 CLINICAL DATA:  Chronic low back pain, right hip pain, and bilateral lower leg pain for 1 year. No known injury. EXAM: RIGHT TIBIA AND FIBULA - 2 VIEW COMPARISON:  None. FINDINGS: There is no evidence of fracture or other focal bone lesions. Soft tissues are unremarkable. IMPRESSION: Negative. Electronically Signed   By: Lucienne Capers M.D.   On: 12/28/2020 18:38   DG Hip Unilat W OR W/O Pelvis 1V Right  Result Date: 12/28/2020 CLINICAL DATA:  Chronic low back pain, right hip pain, and  bilateral lower leg pain for 1 year. No known injury. EXAM: DG HIP (WITH OR WITHOUT PELVIS) 1V RIGHT COMPARISON:  None. FINDINGS: The pelvis and sacrum appear intact. Degenerative changes noted in the lower lumbar spine. Right hip appears intact. No evidence of acute fracture or dislocation. No focal bone lesions. Surgical clips in the mid pelvis. IMPRESSION: No acute bony abnormalities. Electronically Signed   By: Lucienne Capers M.D.   On: 12/28/2020 18:35

## 2020-12-28 ENCOUNTER — Encounter: Payer: Self-pay | Admitting: Family Medicine

## 2020-12-28 ENCOUNTER — Other Ambulatory Visit: Payer: Self-pay

## 2020-12-28 ENCOUNTER — Ambulatory Visit (HOSPITAL_BASED_OUTPATIENT_CLINIC_OR_DEPARTMENT_OTHER)
Admission: RE | Admit: 2020-12-28 | Discharge: 2020-12-28 | Disposition: A | Discharge status: Home / Self Care | Payer: Medicare Other | Source: Ambulatory Visit | Attending: Family Medicine | Admitting: Family Medicine

## 2020-12-28 ENCOUNTER — Ambulatory Visit (INDEPENDENT_AMBULATORY_CARE_PROVIDER_SITE_OTHER): Payer: Medicare Other | Admitting: Family Medicine

## 2020-12-28 VITALS — BP 142/72 | HR 97 | Resp 18 | Wt 140.6 lb

## 2020-12-28 DIAGNOSIS — R52 Pain, unspecified: Secondary | ICD-10-CM | POA: Diagnosis not present

## 2020-12-28 DIAGNOSIS — M79669 Pain in unspecified lower leg: Secondary | ICD-10-CM | POA: Diagnosis not present

## 2020-12-28 DIAGNOSIS — M25551 Pain in right hip: Secondary | ICD-10-CM

## 2020-12-28 DIAGNOSIS — E1142 Type 2 diabetes mellitus with diabetic polyneuropathy: Secondary | ICD-10-CM

## 2020-12-28 DIAGNOSIS — M79661 Pain in right lower leg: Secondary | ICD-10-CM | POA: Diagnosis not present

## 2020-12-28 DIAGNOSIS — I959 Hypotension, unspecified: Secondary | ICD-10-CM | POA: Diagnosis not present

## 2020-12-28 DIAGNOSIS — I10 Essential (primary) hypertension: Secondary | ICD-10-CM | POA: Diagnosis not present

## 2020-12-28 DIAGNOSIS — M47816 Spondylosis without myelopathy or radiculopathy, lumbar region: Secondary | ICD-10-CM | POA: Diagnosis not present

## 2020-12-28 DIAGNOSIS — M79662 Pain in left lower leg: Secondary | ICD-10-CM | POA: Diagnosis not present

## 2020-12-28 DIAGNOSIS — M25552 Pain in left hip: Secondary | ICD-10-CM | POA: Diagnosis not present

## 2020-12-28 DIAGNOSIS — M545 Low back pain, unspecified: Secondary | ICD-10-CM | POA: Diagnosis not present

## 2020-12-28 LAB — CBC
HCT: 40.7 % (ref 36.0–46.0)
Hemoglobin: 13.1 g/dL (ref 12.0–15.0)
MCHC: 32.2 g/dL (ref 30.0–36.0)
MCV: 84.3 fl (ref 78.0–100.0)
Platelets: 249 10*3/uL (ref 150.0–400.0)
RBC: 4.83 Mil/uL (ref 3.87–5.11)
RDW: 15.7 % — ABNORMAL HIGH (ref 11.5–15.5)
WBC: 4.6 10*3/uL (ref 4.0–10.5)

## 2020-12-28 LAB — COMPREHENSIVE METABOLIC PANEL
ALT: 9 U/L (ref 0–35)
AST: 16 U/L (ref 0–37)
Albumin: 4.4 g/dL (ref 3.5–5.2)
Alkaline Phosphatase: 61 U/L (ref 39–117)
BUN: 24 mg/dL — ABNORMAL HIGH (ref 6–23)
CO2: 32 mEq/L (ref 19–32)
Calcium: 9.9 mg/dL (ref 8.4–10.5)
Chloride: 102 mEq/L (ref 96–112)
Creatinine, Ser: 1.37 mg/dL — ABNORMAL HIGH (ref 0.40–1.20)
GFR: 33.46 mL/min — ABNORMAL LOW (ref >60.00)
Glucose, Bld: 105 mg/dL — ABNORMAL HIGH (ref 70–99)
Potassium: 4.8 mEq/L (ref 3.5–5.1)
Sodium: 140 mEq/L (ref 135–145)
Total Bilirubin: 0.4 mg/dL (ref 0.2–1.2)
Total Protein: 7 g/dL (ref 6.0–8.3)

## 2020-12-28 LAB — HEMOGLOBIN A1C: Hgb A1c MFr Bld: 6.5 % (ref 4.6–6.5)

## 2020-12-28 LAB — CK: Total CK: 48 U/L (ref 7–177)

## 2020-12-28 LAB — SEDIMENTATION RATE: Sed Rate: 38 mm/hr — ABNORMAL HIGH (ref 0–30)

## 2020-12-28 MED ORDER — ACETAMINOPHEN-CODEINE #3 300-30 MG PO TABS: 1.0000 | ORAL_TABLET | Freq: Four times a day (QID) | ORAL | 0 refills | Status: DC | PRN

## 2020-12-28 NOTE — Patient Instructions (Addendum)
It was nice to see you today- I am sorry you are not feeling well Please go to lab and then to the ground floor to have some x-rays taken Since the muscle relaxer does not seem to be helping let's have you stop using it Take a 1/2 tablet twice a day for 3-4 days and then stop using it I gave you some tylenol with codeine that you can try as needed for pain- let me know how this works for you and take care!

## 2020-12-29 ENCOUNTER — Other Ambulatory Visit: Payer: Self-pay | Admitting: Family Medicine

## 2020-12-29 LAB — RHEUMATOID FACTOR: Rheumatoid fact SerPl-aCnc: <14 IU/mL (ref <14)

## 2021-01-03 ENCOUNTER — Telehealth: Payer: Self-pay | Admitting: Family Medicine

## 2021-01-03 NOTE — Telephone Encounter (Signed)
Patient is calling in reference to lab/imaging results. Patient would like a call back to go over results.

## 2021-01-04 NOTE — Telephone Encounter (Signed)
Pt was seen by [facility].

## 2021-01-06 ENCOUNTER — Telehealth: Payer: Self-pay | Admitting: Family Medicine

## 2021-01-06 NOTE — Telephone Encounter (Signed)
Left message to return call. Letter was also mailed with results a couple days ago by pcp.

## 2021-01-06 NOTE — Telephone Encounter (Signed)
Patient states her Dana Burns shows that she has arthritis, Which she already knew but she's still hurting,  And that you were very sweet

## 2021-01-09 ENCOUNTER — Other Ambulatory Visit: Payer: Self-pay | Admitting: Family Medicine

## 2021-01-10 ENCOUNTER — Ambulatory Visit: Payer: Medicare Other | Admitting: Family Medicine

## 2021-01-18 ENCOUNTER — Telehealth: Payer: Self-pay | Admitting: Pharmacist

## 2021-01-18 NOTE — Progress Notes (Addendum)
Chronic Care Management Pharmacy Assistant   Name: Dana Burns  MRN: [id number] DOB: 1926/08/04  Reason for Encounter: HLD Disease State  Patient Questions:  1.  Have you seen any other providers since your last visit? Yes  2.  Any changes in your medicines or health? Yes    PCP : Dana Lukes, MD   Their chronic conditions include: Diabetes, Hypertension, Depression/Anxiety, GERD, Neuropathy/Fibromyalgia, Pain  Office Visits: 12-05-2020 (PCP) Patient presented via video visit c/o leg spasms. Patient is taking Tramadol and Tylenol with little relief. Patient stated Tizanidine has helped somewhat  Medication changes: Pravastatin 20 mg was discontinued. Tizanidine was increased from 1-2 mg to 4 mg twice daily  12-28-2020 (PCP) Patient presented in the office to be evaluated for low blood pressure. BP has been dropping, getting as low as 64/46 on 12-24-20. Patient reported feeling light headed causing her to check her BP. She also has complaints of pain all over her body. Lab work ordered and x-raya taken. Plan to trend off of Tramadol.  Consults: None since their last disease state call.  Allergies:   Allergies  Allergen Reactions   Tape Itching and Rash   Actos [Pioglitazone Hydrochloride] Other (See Comments)    Unknown   Buspar [Buspirone Hcl] Other (See Comments)    Unknown   Lipitor [Atorvastatin Calcium] Other (See Comments)    "It made me hurt all over."   Penicillins Rash    Medications: Outpatient Encounter Medications as of 01/18/2021  Medication Sig   acetaminophen-codeine (TYLENOL #3) 300-30 MG tablet Take 1 tablet by mouth every 6 (six) hours as needed for moderate pain.   calcium carbonate (OS-CAL) 600 MG TABS Take 600 mg by mouth 2 (two) times daily with a meal.   Cholecalciferol (VITAMIN D3) 50 MCG (2000 UT) capsule Take 2,000 Units by mouth daily.    citalopram (CELEXA) 20 MG tablet TAKE 1 TABLET BY MOUTH EVERY DAY   Cyanocobalamin (B-12 IJ) Place  1,000 mcg under the tongue.    diazepam (VALIUM) 5 MG tablet TAKE 1/2 TO 1 TABLET DAILY AS NEEDED FOR ANXIETY   gabapentin (NEURONTIN) 800 MG tablet TAKE 1 TABLET BY MOUTH THREE TIMES A DAY   metFORMIN (GLUCOPHAGE) 500 MG tablet Take 1 tablet (500 mg total) by mouth daily with breakfast.   pantoprazole (PROTONIX) 40 MG tablet Take 1 tablet (40 mg total) by mouth daily.   tiZANidine (ZANAFLEX) 4 MG tablet Take 1 tablet (4 mg total) by mouth 2 (two) times daily as needed for muscle spasms.   No facility-administered encounter medications on file as of 01/18/2021.    Current Diagnosis: Patient Active Problem List   Diagnosis Date Noted   Abdominal pain 09/29/2017   Constipation 09/29/2017   Pain in finger of right hand 05/15/2017   Chronic pain syndrome 01/23/2015   Vitamin B 12 deficiency 07/07/2014   Pain of right shoulder region 07/07/2014   Chicken pox    Measles    Peripheral neuropathy 07/01/2014   Loss of weight 12/14/2013   Fracture of wrist 12/14/2013   Osteopenia 12/14/2013   Anxiety state 09/03/2013   Pain in joint, lower leg 09/03/2013   Hyperlipidemia, mixed    Fibromyalgia 06/01/2013   Stress 06/01/2013   Breast mass, right 10/14/2012   Type 2 diabetes mellitus with peripheral neuropathy ([place]) 03/17/2011   Osteoarthritis (arthritis due to wear and tear of joints) 03/17/2011   Diabetic neuropathy ([place]) 03/17/2011   Chronic renal insufficiency 03/17/2011  History of colon cancer 03/17/2011   GERD (gastroesophageal reflux disease) 03/17/2011   Ductal hyperplasia of breast 03/17/2011   Essential hypertension, benign 05/31/2010   Tachycardia 05/31/2010   SOB (shortness of breath) 05/31/2010    Goals Addressed   None    01/18/2021 Name: Dana Burns MRN: [id number] DOB: April 23, 1926 Dana Burns is a 87 y.o. year old female who is a primary care patient of Dana Lukes, MD.  Comprehensive medication review performed; Spoke to patient regarding  cholesterol  Lipid Panel    Component Value Date/Time   CHOL 186 08/22/2020 1321   CHOL 180 06/21/2014 1449   CHOL 148 05/29/2013 1131   TRIG 162 (H) 08/22/2020 1321   TRIG 117 03/16/2014 1708   TRIG 133 05/29/2013 1131   HDL 40 (L) 08/22/2020 1321   HDL 56 06/21/2014 1449   HDL 55 03/16/2014 1708   HDL 48 05/29/2013 1131   LDLCALC 118 (H) 08/22/2020 1321   LDLCALC 77 03/16/2014 1708   LDLCALC 73 05/29/2013 1131   LDLDIRECT 120.0 04/12/2020 1355    10-year ASCVD risk score: The ASCVD Risk score Mikey Bussing DC Jr., et al., 2013) failed to calculate for the following reasons:   The 2013 ASCVD risk score is only valid for ages 13 to 10  Current antihyperlipidemic regimen:  Krill Oil 350mg  daily  Previous antihyperlipidemic medications tried: Lipitor   ASCVD risk enhancing conditions: age >63, DM and HTN   What recent interventions/DTPs have been made by any provider to improve Cholesterol control since last CPP Visit: none. Pravastatin has been d/c due to leg pain  Any recent hospitalizations or ED visits since last visit with CPP? No   What diet changes have been made to improve Cholesterol?  Patient does not eat much throughout the day. She has fruit for breakfast and soups for dinner. Doesn't eat much meat.  What exercise is being done to improve Cholesterol?  None   Patient shared with me that she did not feel well today. She has been in a lot of pain all over and canot. She said earlier today while attempting to put on a pair of pant,  she felt as if she would pass out. This led her to sit down on her couch. She felt somewhat dizzy and laid down, and then fell asleep. I inquired if she had checked her blood pressure today. She hadn't check it today, but yesterday it was 133/62 pulse 70. I asked her to check her blood pressure while on the phone with me. It was117/61, pulse 89. Patient reported her blood sugar being 130 this morning fasting. I assured Ms. Dana Burns I would forward  this information to the clinical pharmacist for review.  Adherence Review: Does the patient have >5 day gap between last estimated fill dates? No    Follow-Up:  Pharmacist Review   Fanny Skates, [place] Pharmacist Assistant [phone]  3 minutes spent in review, coordination, and documentation.  Reviewed by: Beverly Milch, PharmD Clinical Pharmacist [facility] [phone]

## 2021-02-26 ENCOUNTER — Other Ambulatory Visit: Payer: Self-pay | Admitting: Family Medicine

## 2021-02-28 ENCOUNTER — Other Ambulatory Visit: Payer: Self-pay | Admitting: Family Medicine

## 2021-03-07 ENCOUNTER — Telehealth: Payer: Medicare Other | Admitting: Family Medicine

## 2021-03-16 ENCOUNTER — Other Ambulatory Visit: Payer: Self-pay | Admitting: Family Medicine

## 2021-03-28 ENCOUNTER — Ambulatory Visit (INDEPENDENT_AMBULATORY_CARE_PROVIDER_SITE_OTHER): Payer: Medicare Other | Admitting: Family Medicine

## 2021-03-28 ENCOUNTER — Encounter: Payer: Self-pay | Admitting: Family Medicine

## 2021-03-28 ENCOUNTER — Other Ambulatory Visit: Payer: Self-pay

## 2021-03-28 VITALS — BP 110/76 | HR 65 | Temp 97.6°F | Resp 16 | Wt 144.0 lb

## 2021-03-28 DIAGNOSIS — E782 Mixed hyperlipidemia: Secondary | ICD-10-CM

## 2021-03-28 DIAGNOSIS — I10 Essential (primary) hypertension: Secondary | ICD-10-CM | POA: Diagnosis not present

## 2021-03-28 DIAGNOSIS — M2550 Pain in unspecified joint: Secondary | ICD-10-CM | POA: Diagnosis not present

## 2021-03-28 DIAGNOSIS — G8929 Other chronic pain: Secondary | ICD-10-CM

## 2021-03-28 DIAGNOSIS — E538 Deficiency of other specified B group vitamins: Secondary | ICD-10-CM | POA: Diagnosis not present

## 2021-03-28 DIAGNOSIS — E1142 Type 2 diabetes mellitus with diabetic polyneuropathy: Secondary | ICD-10-CM

## 2021-03-28 DIAGNOSIS — M5442 Lumbago with sciatica, left side: Secondary | ICD-10-CM | POA: Diagnosis not present

## 2021-03-28 DIAGNOSIS — N189 Chronic kidney disease, unspecified: Secondary | ICD-10-CM | POA: Diagnosis not present

## 2021-03-28 DIAGNOSIS — R000 Tachycardia, unspecified: Secondary | ICD-10-CM | POA: Diagnosis not present

## 2021-03-28 DIAGNOSIS — M1990 Unspecified osteoarthritis, unspecified site: Secondary | ICD-10-CM

## 2021-03-28 DIAGNOSIS — M5441 Lumbago with sciatica, right side: Secondary | ICD-10-CM

## 2021-03-28 DIAGNOSIS — N289 Disorder of kidney and ureter, unspecified: Secondary | ICD-10-CM

## 2021-03-28 LAB — LIPID PANEL
Cholesterol: 270 mg/dL — ABNORMAL HIGH (ref 0–200)
HDL: 44.8 mg/dL (ref >39.00)
LDL Cholesterol: 195 mg/dL — ABNORMAL HIGH (ref 0–99)
NonHDL: 224.9
Total CHOL/HDL Ratio: 6
Triglycerides: 150 mg/dL — ABNORMAL HIGH (ref 0.0–149.0)
VLDL: 30 mg/dL (ref 0.0–40.0)

## 2021-03-28 LAB — COMPREHENSIVE METABOLIC PANEL
ALT: 7 U/L (ref 0–35)
AST: 14 U/L (ref 0–37)
Albumin: 4.3 g/dL (ref 3.5–5.2)
Alkaline Phosphatase: 62 U/L (ref 39–117)
BUN: 23 mg/dL (ref 6–23)
CO2: 33 mEq/L — ABNORMAL HIGH (ref 19–32)
Calcium: 10.1 mg/dL (ref 8.4–10.5)
Chloride: 102 mEq/L (ref 96–112)
Creatinine, Ser: 1.29 mg/dL — ABNORMAL HIGH (ref 0.40–1.20)
GFR: 35.9 mL/min — ABNORMAL LOW (ref >60.00)
Glucose, Bld: 108 mg/dL — ABNORMAL HIGH (ref 70–99)
Potassium: 4.9 mEq/L (ref 3.5–5.1)
Sodium: 140 mEq/L (ref 135–145)
Total Bilirubin: 0.4 mg/dL (ref 0.2–1.2)
Total Protein: 7 g/dL (ref 6.0–8.3)

## 2021-03-28 LAB — CBC
HCT: 39 % (ref 36.0–46.0)
Hemoglobin: 12.7 g/dL (ref 12.0–15.0)
MCHC: 32.5 g/dL (ref 30.0–36.0)
MCV: 83.5 fl (ref 78.0–100.0)
Platelets: 240 10*3/uL (ref 150.0–400.0)
RBC: 4.67 Mil/uL (ref 3.87–5.11)
RDW: 16.3 % — ABNORMAL HIGH (ref 11.5–15.5)
WBC: 4.9 10*3/uL (ref 4.0–10.5)

## 2021-03-28 LAB — HEMOGLOBIN A1C: Hgb A1c MFr Bld: 6.6 % — ABNORMAL HIGH (ref 4.6–6.5)

## 2021-03-28 LAB — SEDIMENTATION RATE: Sed Rate: 27 mm/hr (ref 0–30)

## 2021-03-28 LAB — TSH: TSH: 3.09 u[IU]/mL (ref 0.35–4.50)

## 2021-03-28 LAB — MICROALBUMIN / CREATININE URINE RATIO
Creatinine,U: 112.4 mg/dL
Microalb Creat Ratio: 0.9 mg/g (ref 0.0–30.0)
Microalb, Ur: 1 mg/dL (ref 0.0–1.9)

## 2021-03-28 NOTE — Progress Notes (Signed)
Patient ID: Dana Burns, female    DOB: 1926/04/23  Age: 87 y.o. MRN: [id number]    Subjective:  Subjective  HPI [name] presents for office visit today. She complains of bilateral LE and UE pain which has been present for a year. She reports she cannot walk because of the pain. She states that accompanying the pain there is swelling in her LE. She reports pain in the hips and lumbar back. She states that the pain is worse in the right leg. She expresses her desire to avoid surgery if possible. She reports having SOB when walking from a room to room. She endorses taking tylenol extra strength x2 a day and applies a lidocaine topical cream to help with the pain. She states taking tizanidine every night before she sleep and reports that she feels sleepy after taking it which helps her with her sleep. She expresses concern regarding possible medicine interactions. She denies any chest pain, fever, abdominal pain, cough, chills, sore throat, dysuria, urinary incontinence, HA, or N/VD. She endorses taking the first 2 COVID-19 shots, but not the booster yet.    Review of Systems  Constitutional: Negative for chills, fatigue and fever.  HENT: Negative for congestion, rhinorrhea, sinus pressure, sinus pain and sore throat.   Eyes: Negative for pain.  Respiratory: Positive for shortness of breath. Negative for cough and wheezing.   Cardiovascular: Negative for chest pain, palpitations and leg swelling.  Gastrointestinal: Negative for abdominal pain, blood in stool, diarrhea, nausea and vomiting.  Genitourinary: Negative for decreased urine volume, flank pain, frequency, vaginal bleeding and vaginal discharge.  Musculoskeletal: Positive for arthralgias and back pain.       Bilateral LE and UE pain  Neurological: Negative for headaches.    History Past Medical History:  Diagnosis Date  . Allergy    sneeze, rhinorrhea in am  . Chicken pox as a child  . Chronic pain syndrome 01/23/2015  . Colon  cancer ([place])   . Complication of anesthesia    agitated when waking up, wakes up shaking, "like  I'm going into shock"  . Depression    husband died  3 months ago, pt. admits depression  currently   . Diabetes mellitus    6 years  . DM (diabetes mellitus) type 2, uncontrolled, with ketoacidosis ([place]) 03/17/2011  . Fibromyalgia   . GERD (gastroesophageal reflux disease)   . H/O echocardiogram    done /w [place] in 2011, saw Dr. Percival Spanish for tachycardia   . Headache(784.0)    occasional - "bad headaches"  . Hyperlipidemia    6 years  . Hypertension    15 years, reports she has a rapid heartrate   . Measles as a child  . Osteoarthritis    in hands & all over, feet  . Pain in finger of right hand 05/15/2017  . Pain of right shoulder region 07/07/2014  . Peripheral neuropathy   . Shortness of breath   . Type 2 diabetes mellitus with peripheral neuropathy ([place]) 03/17/2011    She has a past surgical history that includes Cataract extraction; Colon surgery; Nodule removed; Breast surgery (2013); and Breast excisional biopsy (Right, 2013).   Her family history includes Arthritis in her sister; COPD in her brother; Cancer in her brother, brother, brother, father, mother, and sister; Emphysema in her brother; Fibromyalgia in her daughter; Neuropathy in her daughter; Other in her brother.She reports that she has never smoked. She has never used smokeless tobacco. She reports that she  does not drink alcohol and does not use drugs.  Current Outpatient Medications on File Prior to Visit  Medication Sig Dispense Refill  . acetaminophen (TYLENOL) 500 MG tablet Take 500 mg by mouth every 6 (six) hours as needed.    . calcium carbonate (OS-CAL) 600 MG TABS Take 600 mg by mouth 2 (two) times daily with a meal.    . Cholecalciferol (VITAMIN D3) 50 MCG (2000 UT) capsule Take 2,000 Units by mouth daily.     . citalopram (CELEXA) 20 MG tablet TAKE 1 TABLET BY MOUTH EVERY DAY 90 tablet 2  . Cyanocobalamin  (B-12 IJ) Place 1,000 mcg under the tongue.     . diazepam (VALIUM) 5 MG tablet TAKE 1/2 TO 1 TABLET DAILY AS NEEDED FOR ANXIETY 30 tablet 3  . gabapentin (NEURONTIN) 800 MG tablet Take 1 tablet (800 mg total) by mouth 3 (three) times daily. 270 tablet 1  . metFORMIN (GLUCOPHAGE) 500 MG tablet Take 1 tablet (500 mg total) by mouth daily with breakfast. 90 tablet 1  . pantoprazole (PROTONIX) 40 MG tablet Take 1 tablet (40 mg total) by mouth daily. 90 tablet 3  . tiZANidine (ZANAFLEX) 4 MG tablet Take 1 tablet (4 mg total) by mouth 2 (two) times daily as needed for muscle spasms. 180 tablet 1   No current facility-administered medications on file prior to visit.     Objective:  Objective  Physical Exam Constitutional:      General: She is not in acute distress.    Appearance: Normal appearance. She is not ill-appearing or toxic-appearing.  HENT:     Head: Normocephalic and atraumatic.     Right Ear: Tympanic membrane, ear canal and external ear normal.     Left Ear: Tympanic membrane, ear canal and external ear normal.     Nose: No congestion or rhinorrhea.  Eyes:     Extraocular Movements: Extraocular movements intact.     Pupils: Pupils are equal, round, and reactive to light.  Cardiovascular:     Rate and Rhythm: Normal rate and regular rhythm.     Pulses: Normal pulses.     Heart sounds: Normal heart sounds. No murmur heard.   Pulmonary:     Effort: Pulmonary effort is normal. No respiratory distress.     Breath sounds: Normal breath sounds. No wheezing, rhonchi or rales.  Abdominal:     General: Bowel sounds are normal.     Palpations: Abdomen is soft. There is no mass.     Tenderness: There is no abdominal tenderness. There is no guarding.     Hernia: No hernia is present.  Musculoskeletal:        General: Normal range of motion.     Cervical back: Normal range of motion and neck supple.  Skin:    General: Skin is warm and dry.  Neurological:     Mental Status: She is  alert and oriented to person, place, and time.  Psychiatric:        Behavior: Behavior normal.    BP 110/76   Pulse 65   Temp 97.6 F (36.4 C)   Resp 16   Wt 144 lb (65.3 kg)   SpO2 97%   BMI 22.89 kg/m  Wt Readings from Last 3 Encounters:  03/28/21 144 lb (65.3 kg)  12/28/20 140 lb 9.6 oz (63.8 kg)  08/22/20 142 lb (64.4 kg)     Lab Results  Component Value Date   WBC 4.9 03/28/2021   HGB  12.7 03/28/2021   HCT 39.0 03/28/2021   PLT 240.0 03/28/2021   GLUCOSE 108 (H) 03/28/2021   CHOL 270 (H) 03/28/2021   TRIG 150.0 (H) 03/28/2021   HDL 44.80 03/28/2021   LDLDIRECT 120.0 04/12/2020   LDLCALC 195 (H) 03/28/2021   ALT 7 03/28/2021   AST 14 03/28/2021   NA 140 03/28/2021   K 4.9 03/28/2021   CL 102 03/28/2021   CREATININE 1.29 (H) 03/28/2021   BUN 23 03/28/2021   CO2 33 (H) 03/28/2021   TSH 3.09 03/28/2021   HGBA1C 6.6 (H) 03/28/2021   MICROALBUR 1.0 03/28/2021    DG Lumbar Spine Complete  Result Date: 12/28/2020 CLINICAL DATA:  Chronic low back pain, right hip pain, and bilateral lower leg pain for 1 year. No known injury. EXAM: LUMBAR SPINE - COMPLETE 4+ VIEW COMPARISON:  None. FINDINGS: Five lumbar type vertebra. Normal alignment. No vertebral compression. Mild diffuse bone demineralization. Degenerative changes throughout with narrowed interspaces and endplate hypertrophic change. Degenerative changes in the lower lumbar facet joints. No vertebral compression deformities. No focal bone lesion or bone destruction. Vascular calcifications. Surgical clips in the pelvis. IMPRESSION: Degenerative changes in the lumbar spine. No acute displaced fractures identified. Electronically Signed   By: Lucienne Capers M.D.   On: 12/28/2020 18:36   DG Tibia/Fibula Left  Result Date: 12/28/2020 CLINICAL DATA:  Chronic low back pain, right hip pain, and bilateral lower leg pain for 1 year. No known injury. EXAM: LEFT TIBIA AND FIBULA - 2 VIEW COMPARISON:  None. FINDINGS: There  is no evidence of fracture or other focal bone lesions. Soft tissues are unremarkable. IMPRESSION: Negative. Electronically Signed   By: Lucienne Capers M.D.   On: 12/28/2020 18:37   DG Tibia/Fibula Right  Result Date: 12/28/2020 CLINICAL DATA:  Chronic low back pain, right hip pain, and bilateral lower leg pain for 1 year. No known injury. EXAM: RIGHT TIBIA AND FIBULA - 2 VIEW COMPARISON:  None. FINDINGS: There is no evidence of fracture or other focal bone lesions. Soft tissues are unremarkable. IMPRESSION: Negative. Electronically Signed   By: Lucienne Capers M.D.   On: 12/28/2020 18:38   DG Hip Unilat W OR W/O Pelvis 1V Right  Result Date: 12/28/2020 CLINICAL DATA:  Chronic low back pain, right hip pain, and bilateral lower leg pain for 1 year. No known injury. EXAM: DG HIP (WITH OR WITHOUT PELVIS) 1V RIGHT COMPARISON:  None. FINDINGS: The pelvis and sacrum appear intact. Degenerative changes noted in the lower lumbar spine. Right hip appears intact. No evidence of acute fracture or dislocation. No focal bone lesions. Surgical clips in the mid pelvis. IMPRESSION: No acute bony abnormalities. Electronically Signed   By: Lucienne Capers M.D.   On: 12/28/2020 18:35     Assessment & Plan:  Plan    No orders of the defined types were placed in this encounter.   Problem List Items Addressed This Visit    Essential hypertension, benign   Relevant Orders   CBC (Completed)   Comprehensive metabolic panel (Completed)   TSH (Completed)   Tachycardia    Mild, asymptomatic      Type 2 diabetes mellitus with peripheral neuropathy (H[facility])    hgba1c acceptable, minimize simple carbs. Increase exercise as tolerated. Continue current meds      Relevant Orders   Hemoglobin A1c (Completed)   Microalbumin / creatinine urine ratio (Completed)   Osteoarthritis (arthritis due to wear and tear of joints)    Low back pain  with radicular pain in both legs, checked RF which was negative, Encouraged  moist heat and gentle stretching as tolerated. May try NSAIDs and prescription meds as directed and report if symptoms worsen or seek immediate care. Increase tylenol 500 mg tabs to tid, lidocaine gel. She is frustated by her level of pain every day. Is referred back to orthopaedics to discuss her options.      Relevant Medications   acetaminophen (TYLENOL) 500 MG tablet   Chronic renal insufficiency    Hydrate and monitor      Relevant Orders   Comprehensive metabolic panel (Completed)   Hyperlipidemia, mixed    Encouraged heart healthy diet, increase exercise, avoid trans fats, consider a krill oil cap daily      Relevant Orders   Lipid panel (Completed)   Vitamin B 12 deficiency    Supplement and monitor       Other Visit Diagnoses    Chronic midline low back pain with bilateral sciatica    -  Primary   Relevant Medications   acetaminophen (TYLENOL) 500 MG tablet   Other Relevant Orders   Ambulatory referral to Orthopedic Surgery   Arthralgia, unspecified joint       Relevant Orders   Sedimentation rate (Completed)   Rheumatoid Factor (Completed)      Follow-up: Return in about 3 months (around 06/28/2021).   I,David Hanna,acting as a scribe for Penni Homans, MD.,have documented all relevant documentation on the behalf of Penni Homans, MD,as directed by  Penni Homans, MD while in the presence of Penni Homans, MD.   I, Mosie Lukes, MD personally performed the services described in this documentation. All medical record entries made by the scribe were at my direction and in my presence. I have reviewed the chart and agree that the record reflects my personal performance and is accurate and complete

## 2021-03-29 LAB — RHEUMATOID FACTOR: Rheumatoid fact SerPl-aCnc: <14 IU/mL (ref <14)

## 2021-03-29 NOTE — Assessment & Plan Note (Signed)
Encouraged heart healthy diet, increase exercise, avoid trans fats, consider a krill oil cap daily 

## 2021-03-29 NOTE — Assessment & Plan Note (Signed)
hgba1c acceptable, minimize simple carbs. Increase exercise as tolerated. Continue current meds 

## 2021-03-29 NOTE — Assessment & Plan Note (Signed)
Hydrate and monitor 

## 2021-03-29 NOTE — Assessment & Plan Note (Signed)
Mild, asymptomatic, with leukopenia. 

## 2021-03-29 NOTE — Assessment & Plan Note (Signed)
Low back pain with radicular pain in both legs, checked RF which was negative, Encouraged moist heat and gentle stretching as tolerated. May try NSAIDs and prescription meds as directed and report if symptoms worsen or seek immediate care. Increase tylenol 500 mg tabs to tid, lidocaine gel. She is frustated by her level of pain every day. Is referred back to orthopaedics to discuss her options.

## 2021-03-29 NOTE — Assessment & Plan Note (Signed)
Supplement and monitor 

## 2021-04-04 ENCOUNTER — Telehealth: Payer: Self-pay | Admitting: Family Medicine

## 2021-04-04 NOTE — Telephone Encounter (Signed)
Dana Burns called orthopedics (Dr. Penelope Coop office) and they told her that she was not seen and they would look into it and call us back.  [name] from Dr. Noemi Chapel called [place] and stated that patient walked yesterday stating that she has neuropathy and that her legs where bothering her and they told her that she needs to see a neurologist for this.  Advised ortho that we had sent a referral to them and stated that it was for back pain with sciatica.  They had referral but had not processed it yet.  They will work on this and Dr. Thedore Mins assistant will call them to schedule since Dr. Noemi Chapel do not work with back issues.  Spoke with daughter and advised her of what was talked about in the visit.  Patient told her that she did not have back pain.  I advised her that she stated that pain runs down her legs and it could possibly come from her back.  Went over what was dicussed in visit and daughter was ok with referral.

## 2021-04-04 NOTE — Telephone Encounter (Signed)
Patient needs a referral to a neurologist per specialist she seen yesterday.  Please advise

## 2021-04-04 NOTE — Telephone Encounter (Signed)
Patient would like to go to :  [facility]

## 2021-04-06 ENCOUNTER — Telehealth: Payer: Self-pay | Admitting: Family Medicine

## 2021-04-06 ENCOUNTER — Other Ambulatory Visit: Payer: Self-pay | Admitting: Family Medicine

## 2021-04-06 DIAGNOSIS — S134XXA Sprain of ligaments of cervical spine, initial encounter: Secondary | ICD-10-CM

## 2021-04-06 DIAGNOSIS — F439 Reaction to severe stress, unspecified: Secondary | ICD-10-CM

## 2021-04-06 DIAGNOSIS — M25511 Pain in right shoulder: Secondary | ICD-10-CM

## 2021-04-06 DIAGNOSIS — G894 Chronic pain syndrome: Secondary | ICD-10-CM

## 2021-04-06 NOTE — Telephone Encounter (Signed)
Caller: [facility] called stating Dr.Weiner does not have anything to offer the patient. Dr. Para March recommend for patient to be refer to a Neurology (no appt was scheduled)

## 2021-04-06 NOTE — Telephone Encounter (Signed)
They would like to refer her to neurology

## 2021-04-06 NOTE — Telephone Encounter (Signed)
Referral done

## 2021-04-07 DIAGNOSIS — E119 Type 2 diabetes mellitus without complications: Secondary | ICD-10-CM | POA: Diagnosis not present

## 2021-04-21 NOTE — Telephone Encounter (Signed)
Set up a VV with patient and [place] in next 2 weeks to discuss options and let her know specialists have declined referral

## 2021-04-21 NOTE — Telephone Encounter (Signed)
Per epic note . [facility] denied patient states they don't do anything with chronic pain. So her referral to Neuro was denied also.

## 2021-04-21 NOTE — Telephone Encounter (Signed)
Pt's daughter states that she has an appointment with Dr. Thedore Mins 05/08/21

## 2021-05-18 DIAGNOSIS — M25552 Pain in left hip: Secondary | ICD-10-CM | POA: Diagnosis not present

## 2021-05-18 DIAGNOSIS — M545 Low back pain, unspecified: Secondary | ICD-10-CM | POA: Diagnosis not present

## 2021-05-18 DIAGNOSIS — M25551 Pain in right hip: Secondary | ICD-10-CM | POA: Diagnosis not present

## 2021-05-19 ENCOUNTER — Other Ambulatory Visit: Payer: Self-pay | Admitting: Physical Medicine & Rehabilitation

## 2021-05-19 DIAGNOSIS — M545 Low back pain, unspecified: Secondary | ICD-10-CM

## 2021-05-28 ENCOUNTER — Ambulatory Visit
Admission: RE | Admit: 2021-05-28 | Discharge: 2021-05-28 | Disposition: A | Discharge status: Home / Self Care | Payer: Medicare Other | Source: Ambulatory Visit | Attending: Physical Medicine & Rehabilitation | Admitting: Physical Medicine & Rehabilitation

## 2021-05-28 DIAGNOSIS — M545 Low back pain, unspecified: Secondary | ICD-10-CM

## 2021-05-28 DIAGNOSIS — M48061 Spinal stenosis, lumbar region without neurogenic claudication: Secondary | ICD-10-CM | POA: Diagnosis not present

## 2021-06-12 DIAGNOSIS — M25551 Pain in right hip: Secondary | ICD-10-CM | POA: Diagnosis not present

## 2021-06-16 ENCOUNTER — Other Ambulatory Visit: Payer: Self-pay | Admitting: Family Medicine

## 2021-06-20 DIAGNOSIS — M5416 Radiculopathy, lumbar region: Secondary | ICD-10-CM | POA: Diagnosis not present

## 2021-06-27 ENCOUNTER — Ambulatory Visit: Payer: Medicare Other | Admitting: Family Medicine

## 2021-07-12 DIAGNOSIS — M545 Low back pain, unspecified: Secondary | ICD-10-CM | POA: Diagnosis not present

## 2021-07-13 ENCOUNTER — Ambulatory Visit (INDEPENDENT_AMBULATORY_CARE_PROVIDER_SITE_OTHER): Payer: Medicare Other | Admitting: Family Medicine

## 2021-07-13 ENCOUNTER — Ambulatory Visit (HOSPITAL_BASED_OUTPATIENT_CLINIC_OR_DEPARTMENT_OTHER)
Admission: RE | Admit: 2021-07-13 | Discharge: 2021-07-13 | Disposition: A | Discharge status: Home / Self Care | Payer: Medicare Other | Source: Ambulatory Visit | Attending: Family Medicine | Admitting: Family Medicine

## 2021-07-13 ENCOUNTER — Other Ambulatory Visit: Payer: Self-pay

## 2021-07-13 ENCOUNTER — Encounter: Payer: Self-pay | Admitting: Family Medicine

## 2021-07-13 VITALS — BP 134/74 | HR 71 | Temp 99.4°F | Resp 16 | Wt 138.4 lb

## 2021-07-13 DIAGNOSIS — N189 Chronic kidney disease, unspecified: Secondary | ICD-10-CM | POA: Diagnosis not present

## 2021-07-13 DIAGNOSIS — E782 Mixed hyperlipidemia: Secondary | ICD-10-CM | POA: Diagnosis not present

## 2021-07-13 DIAGNOSIS — G894 Chronic pain syndrome: Secondary | ICD-10-CM | POA: Diagnosis not present

## 2021-07-13 DIAGNOSIS — M542 Cervicalgia: Secondary | ICD-10-CM | POA: Diagnosis not present

## 2021-07-13 DIAGNOSIS — W19XXXA Unspecified fall, initial encounter: Secondary | ICD-10-CM

## 2021-07-13 DIAGNOSIS — I10 Essential (primary) hypertension: Secondary | ICD-10-CM

## 2021-07-13 DIAGNOSIS — R0602 Shortness of breath: Secondary | ICD-10-CM

## 2021-07-13 DIAGNOSIS — R42 Dizziness and giddiness: Secondary | ICD-10-CM | POA: Diagnosis not present

## 2021-07-13 DIAGNOSIS — E1142 Type 2 diabetes mellitus with diabetic polyneuropathy: Secondary | ICD-10-CM | POA: Diagnosis not present

## 2021-07-13 NOTE — Patient Instructions (Signed)
Understanding Your Risk for Falls Each year, millions of people have serious injuries from falls. It is important to understand your risk for falling. Talk with your health care provider about your risk and what you can do to lower it. There are actions you can take athome to lower your risk and prevent falls. If you do have a serious fall, make sure to tell your health care provider.Falling once raises your risk of falling again. How can falls affect me? Serious injuries from falls are common. These include: Broken bones, such as hip fractures. Head injuries, such as traumatic brain injuries (TBI) or concussion. A fear of falling can cause you to avoid activities and stay at home. This canmake your muscles weaker and actually raise your risk for a fall. What can increase my risk? There are a number of risk factors that increase your risk for falling. The more risk factors you have, the higher your risk of falling. Serious injuries from a fall happen most often to people older than age 65. Children and youngadults ages 15-29 are also at higher risk. Common risk factors include: Weakness in the lower body. Lack (deficiency) of vitamin D. Being generally weak or confused due to long-term (chronic) illness. Dizziness or balance problems. Poor vision. Medicines that cause dizziness or drowsiness. These can include medicines for your blood pressure, heart, anxiety, insomnia, or edema, as well as pain medicines and muscle relaxants. Other risk factors include: Drinking alcohol. Having had a fall in the past. Having depression. Having foot pain or wearing improper footwear. Working at a dangerous job. Having any of the following in your home: Tripping hazards, such as floor clutter or loose rugs. Poor lighting. Pets. Having dementia or memory loss. What actions can I take to lower my risk of falling?     Physical activity Maintain physical fitness. Do strength and balance exercises.  Consider taking aregular class to build strength and balance. Yoga and tai chi are good options. Vision Have your eyes checked every year and your vision prescription updated asneeded. Walking aids and footwear Wear nonskid shoes. Do not wear high heels. Do not walk around the house in socks or slippers. Use a cane or walker as told by your health care provider. Home safety Attach secure railings on both sides of your stairs. Install grab bars for your tub, shower, and toilet. Use a bath mat in your tub or shower. Use good lighting in all rooms. Keep a flashlight near your bed. Make sure there is a clear path from your bed to the bathroom. Use night-lights. Do not use throw rugs. Make sure all carpeting is taped or tacked down securely. Remove all clutter from walkways and stairways, including extension cords. Repair uneven or broken steps. Avoid walking on icy or slippery surfaces. Walk on the grass instead of on icy or slick sidewalks. Use ice melt to get rid of ice on walkways. Use a cordless phone. Questions to ask your health care provider Can you help me check my risk for a fall? Do any of my medicines make me more likely to fall? Should I take a vitamin D supplement? What exercises can I do to improve my strength and balance? Should I make an appointment to have my vision checked? Do I need a bone density test to check for weak bones or osteoporosis? Would it help to use a cane or a walker? Where to find more information Centers for Disease Control and Prevention, [employer]: www.cdc.gov Community-Based Fall Prevention Programs:   www.cdc.gov National Institute on Aging: www.nia.nih.gov Contact a health care provider if: You fall at home. You are afraid of falling at home. You feel weak, drowsy, or dizzy. Summary Serious injuries from a fall happen most often to people older than age 65. Children and young adults ages 15-29 are also at higher risk. Talk with your health care  provider about your risks for falling and how to lower those risks. Taking certain precautions at home can lower your risk for falling. If you fall, always tell your health care provider. This information is not intended to replace advice given to you by your health care provider. Make sure you discuss any questions you have with your healthcare provider. Document Revised: 07/20/2020 Document Reviewed: 07/20/2020 Elsevier Patient Education  2022 [employer] Inc.  
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
## 2021-07-14 ENCOUNTER — Telehealth: Payer: Self-pay

## 2021-07-14 ENCOUNTER — Ambulatory Visit: Payer: Medicare Other | Admitting: Cardiology

## 2021-07-14 NOTE — Telephone Encounter (Signed)
Pt aware that we have placed her on the schedule for 7/19 at 10:00.

## 2021-07-15 DIAGNOSIS — R296 Repeated falls: Secondary | ICD-10-CM

## 2021-07-15 DIAGNOSIS — W19XXXA Unspecified fall, initial encounter: Secondary | ICD-10-CM | POA: Insufficient documentation

## 2021-07-15 HISTORY — DX: Unspecified fall, initial encounter: W19.XXXA

## 2021-07-15 HISTORY — DX: Repeated falls: R29.6

## 2021-07-15 NOTE — Assessment & Plan Note (Signed)
Well controlled, no changes to meds. Encouraged heart healthy diet such as the DASH diet and exercise as tolerated.  °

## 2021-07-15 NOTE — Assessment & Plan Note (Signed)
Hydrate and monitor 

## 2021-07-15 NOTE — Assessment & Plan Note (Signed)
hgba1c acceptable, minimize simple carbs. Increase exercise as tolerated. Continue current meds 

## 2021-07-15 NOTE — Assessment & Plan Note (Addendum)
Still noted with exertion will proceed with repeat echo. EKG shows some new changes but patient refuses to go to ER and is asymptomatic. We are able to arrange a consultation with cardiology early next week. She will seek care if worsens

## 2021-07-15 NOTE — Assessment & Plan Note (Addendum)
She has had 2 falls recently and she denies passing out but is unsure why she fell. Ordered Carotid Dopplers and check labs. She injured her left hand and elbows. Improving now

## 2021-07-15 NOTE — Assessment & Plan Note (Addendum)
Is following with pain management but cannot proceed with further treatments until she has cardiac clearance. [facility] Dr Thedore Mins is managing her pain. [phone]

## 2021-07-17 ENCOUNTER — Other Ambulatory Visit: Payer: Self-pay

## 2021-07-17 DIAGNOSIS — F32A Depression, unspecified: Secondary | ICD-10-CM | POA: Insufficient documentation

## 2021-07-17 DIAGNOSIS — R519 Headache, unspecified: Secondary | ICD-10-CM | POA: Insufficient documentation

## 2021-07-17 DIAGNOSIS — C189 Malignant neoplasm of colon, unspecified: Secondary | ICD-10-CM | POA: Insufficient documentation

## 2021-07-17 DIAGNOSIS — I10 Essential (primary) hypertension: Secondary | ICD-10-CM | POA: Insufficient documentation

## 2021-07-17 DIAGNOSIS — T7840XA Allergy, unspecified, initial encounter: Secondary | ICD-10-CM | POA: Insufficient documentation

## 2021-07-17 DIAGNOSIS — T8859XA Other complications of anesthesia, initial encounter: Secondary | ICD-10-CM | POA: Insufficient documentation

## 2021-07-17 DIAGNOSIS — E785 Hyperlipidemia, unspecified: Secondary | ICD-10-CM | POA: Insufficient documentation

## 2021-07-17 DIAGNOSIS — F418 Other specified anxiety disorders: Secondary | ICD-10-CM | POA: Insufficient documentation

## 2021-07-17 DIAGNOSIS — Z9289 Personal history of other medical treatment: Secondary | ICD-10-CM | POA: Insufficient documentation

## 2021-07-18 ENCOUNTER — Other Ambulatory Visit: Payer: Self-pay

## 2021-07-18 ENCOUNTER — Ambulatory Visit (INDEPENDENT_AMBULATORY_CARE_PROVIDER_SITE_OTHER): Payer: Medicare Other

## 2021-07-18 ENCOUNTER — Ambulatory Visit: Payer: Medicare Other | Admitting: Cardiology

## 2021-07-18 ENCOUNTER — Encounter: Payer: Self-pay | Admitting: Cardiology

## 2021-07-18 VITALS — BP 139/75 | HR 73 | Ht 66.0 in | Wt 138.1 lb

## 2021-07-18 DIAGNOSIS — E782 Mixed hyperlipidemia: Secondary | ICD-10-CM | POA: Diagnosis not present

## 2021-07-18 DIAGNOSIS — R002 Palpitations: Secondary | ICD-10-CM

## 2021-07-18 DIAGNOSIS — E1142 Type 2 diabetes mellitus with diabetic polyneuropathy: Secondary | ICD-10-CM

## 2021-07-18 DIAGNOSIS — I10 Essential (primary) hypertension: Secondary | ICD-10-CM

## 2021-07-18 NOTE — Progress Notes (Signed)
Cardiology Office Note:    Date:  07/18/2021   ID:  Dana Burns, DOB [birth year], MRN [id number]  PCP:  Mosie Lukes, MD  Cardiologist:  Jenean Lindau, MD   Referring MD: Mosie Lukes, MD    ASSESSMENT:    1. Essential hypertension, benign   2. Hyperlipidemia, mixed   3. Type 2 diabetes mellitus with peripheral neuropathy (HCC)   4. Palpitations    PLAN:    In order of problems listed above:  Primary prevention stressed with the patient.  Importance of compliance with diet medication stressed and she vocalized understanding. History of falls: Patient needs to be evaluated.  She has had carotid Dopplers to be done.  Also we will do a 1 week monitoring to see if she has any issues with arrhythmias or any conduction disturbances. Mixed dyslipidemia: LDL is markedly elevated.  She is a diabetic.  However in view of her age I am not sure what is the role for statin therapy.  I will leave this to her long-term provider to assess this. Cardiac murmur: Echocardiogram will be done to assess murmur heard on auscultation. Palpitations: In view of fall which appears to be mechanical I would still like to do a 1 week monitoring to see if she has any conduction disturbances.  She knows to go to the nearest emergency room for any significant issues. Patient will be seen in follow-up appointment in 6 months or earlier if the patient has any concerns    Medication Adjustments/Labs and Tests Ordered: Current medicines are reviewed at length with the patient today.  Concerns regarding medicines are outlined above.  Orders Placed This Encounter  Procedures   LONG TERM MONITOR (3-14 DAYS)   EKG 12-Lead   No orders of the defined types were placed in this encounter.    History of Present Illness:    Dana Burns is a 87 y.o. female who is being seen today for the evaluation of history of fall at the request of Mosie Lukes, MD. patient is a pleasant 87 year old female.  She has  past medical history of diabetes mellitus.  She has had 2 falls for which she is being evaluated.  Carotid Doppler has been in place for the study to be performed.  Patient denies any history of syncope.  Asked her repeatedly if she has any chest pain and she denies.  With activities of daily living she has no shortness of breath or any such issues.  She occasionally has palpitations which are very brief.  At the time of my evaluation, the patient is alert awake oriented and in no distress.  Past Medical History:  Diagnosis Date   Abdominal pain 10/24/17   Allergy    sneeze, rhinorrhea in am   Anxiety state 09/03/2013   Breast mass, right 10/14/2012   Chicken pox as a child   Chronic pain syndrome 01/23/2015   Chronic renal insufficiency 03/17/2011   Colon cancer (HCC)    Complication of anesthesia    agitated when waking up, wakes up shaking, "like  I'm going into shock"   Constipation 10-24-17   Depression    husband died  3 months ago, pt. 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 Percival Spanish for tachycardia    Headache    History of colon cancer 03/17/2011   Hyperlipidemia    6 years   Hyperlipidemia, mixed    6 years   Hypertension    15 years, reports she has a rapid heartrate    Loss of weight 12/14/2013   Measles as a child   Osteoarthritis (arthritis due to wear and tear of joints) 03/17/2011   Noted diffusely including neck   Osteopenia 12/14/2013   Pain in finger of right hand 05/15/2017   Pain in joint, lower leg 09/03/2013   Pain of right shoulder region 07/07/2014   Peripheral neuropathy     SOB (shortness of breath) 05/31/2010   Qualifier: Diagnosis of  By: Percival Spanish, MD, Farrel Gordon     Stress 06/01/2013   Tachycardia 05/31/2010   Qualifier: Diagnosis of  By: Percival Spanish, MD, Farrel Gordon     Type 2 diabetes mellitus with peripheral neuropathy ([place]) 03/17/2011   Vitamin B 12 deficiency 07/07/2014   intrinsic factor positive    Past Surgical History:  Procedure Laterality Date   BREAST EXCISIONAL BIOPSY Right 2013   benign   BREAST SURGERY  2013   right- benign   CATARACT EXTRACTION     /w IOL- bilateral    COLON SURGERY     For resection of cancer   Nodule removed     From throat    Current Medications: Current Meds  Medication Sig   acetaminophen (TYLENOL) 500 MG tablet Take 500 mg by mouth every 6 (six) hours as needed for pain.   calcium carbonate (OS-CAL) 600 MG TABS Take 600 mg by mouth 2 (two) times daily with a meal.   Cholecalciferol (VITAMIN D3) 50 MCG (2000 UT) capsule Take 2,000 Units by mouth daily.    citalopram (CELEXA) 20 MG tablet TAKE 1 TABLET BY MOUTH EVERY DAY   Cyanocobalamin (B-12 IJ) Place 1,000 mcg under the tongue daily.   diazepam (VALIUM) 5 MG tablet TAKE 1/2 TO 1 TABLET DAILY AS NEEDED FOR ANXIETY   gabapentin (NEURONTIN) 800 MG tablet Take 1 tablet (800 mg total) by mouth 3 (three) times daily.   metFORMIN (GLUCOPHAGE) 500 MG tablet Take 1 tablet (500 mg total) by mouth daily with breakfast.   pantoprazole (PROTONIX) 40 MG tablet TAKE 1 TABLET BY MOUTH EVERY DAY   tiZANidine (ZANAFLEX) 4 MG tablet Take 1 tablet (4 mg total) by mouth 2 (two) times daily as needed for muscle spasms.     Allergies:   Tape, Actos [pioglitazone hydrochloride], Buspar [buspirone hcl], Lipitor [atorvastatin calcium], and Penicillins   Social History   Socioeconomic History   Marital status: Widowed    Spouse name: Not on file   Number of children: 1   Years of education: 9   Highest education level: Not on file  Occupational History   Occupation: Retired   Tobacco Use   Smoking status: Never   Smokeless tobacco: Never  Substance and Sexual Activity   Alcohol use: No   Drug use: No   Sexual activity: Never    Comment: lives with daughter. no dietary restrictions  Other Topics Concern   Not on file  Social History Narrative   Patient lives at home with daughter.    Patient is retired.    Patient is widowed.    Patient  has 1 child.    Patient has a 9th grade education.    Social Determinants of Health   Financial Resource Strain: Not on file  Food Insecurity: Not on file  Transportation Needs: Not on file  Physical Activity: Not on file  Stress: Not on file  Social Connections: Not on file     Family History: The patient's family history includes Arthritis in her sister; COPD in her brother; Cancer in her brother, brother, brother, father, mother, and sister; Emphysema in her brother; Fibromyalgia in her daughter; Neuropathy in her daughter; Other in her brother. There is no history of Heart disease.  ROS:   Please see the history of present illness.    All other systems reviewed and are negative.  EKGs/Labs/Other Studies Reviewed:    The following studies were reviewed today: EKG reveals sinus rhythm  with first-degree AV block and left anterior fascicular block and nonspecific ST-T changes   Recent Labs: 03/28/2021: ALT 7; BUN 23; Creatinine, Ser 1.29; Hemoglobin 12.7; Platelets 240.0; Potassium 4.9; Sodium 140; TSH 3.09  Recent Lipid Panel    Component Value Date/Time   CHOL 270 (H) 03/28/2021 1217   CHOL 180 06/21/2014 1449   CHOL 148 05/29/2013 1131   TRIG 150.0 (H) 03/28/2021 1217   TRIG 117 03/16/2014 1708   TRIG 133 05/29/2013 1131   HDL 44.80 03/28/2021 1217   HDL 56 06/21/2014 1449   HDL 55 03/16/2014 1708   HDL 48 05/29/2013 1131   CHOLHDL 6 03/28/2021 1217   VLDL 30.0 03/28/2021 1217   LDLCALC 195 (H) 03/28/2021 1217   LDLCALC 118 (H) 08/22/2020 1321   LDLCALC 77 03/16/2014 1708   LDLCALC 73  05/29/2013 1131   LDLDIRECT 120.0 04/12/2020 1355    Physical Exam:    VS:  BP 139/75   Pulse 73   Ht 5\' 6"  (1.676 m)   Wt 138 lb 1.3 oz (62.6 kg)   SpO2 96%   BMI 22.29 kg/m     Wt Readings from Last 3 Encounters:  07/18/21 138 lb 1.3 oz (62.6 kg)  07/13/21 138 lb 6.4 oz (62.8 kg)  03/28/21 144 lb (65.3 kg)     GEN: Patient is in no acute distress HEENT: Normal NECK: No JVD; No carotid bruits LYMPHATICS: No lymphadenopathy CARDIAC: S1 S2 regular, 2/6 systolic murmur at the apex. RESPIRATORY:  Clear to auscultation without rales, wheezing or rhonchi  ABDOMEN: Soft, non-tender, non-distended MUSCULOSKELETAL:  No edema; No deformity  SKIN: Warm and dry NEUROLOGIC:  Alert and oriented x 3 PSYCHIATRIC:  Normal affect    Signed, Jenean Lindau, MD  07/18/2021 10:37 AM    [place]

## 2021-07-18 NOTE — Patient Instructions (Addendum)
Medication Instructions:  No medication changes. *If you need a refill on your cardiac medications before your next appointment, please call your pharmacy*   Lab Work: None ordered If you have labs (blood work) drawn today and your tests are completely normal, you will receive your results only by: [place] (if you have MyChart) OR A paper copy in the mail If you have any lab test that is abnormal or we need to change your treatment, we will call you to review the results.   Testing/Procedures:  WHY IS MY DOCTOR PRESCRIBING ZIO? The Zio system is proven and trusted by physicians to detect and diagnose irregular heart rhythms -- and has been prescribed to hundreds of thousands of patients.  The FDA has cleared the Zio system to monitor for many different kinds of irregular heart rhythms. In a study, physicians were able to reach a diagnosis 90% of the time with the Zio system1.  You can wear the Zio monitor -- a small, discreet, comfortable patch -- during your normal day-to-day activity, including while you sleep, shower, and exercise, while it records every single heartbeat for analysis.  1Barrett, P., et al. Comparison of 24 Hour Holter Monitoring Versus 14 Day Novel Adhesive Patch Electrocardiographic Monitoring. [place], 2014.  ZIO VS. HOLTER MONITORING The Zio monitor can be comfortably worn for up to 14 days. Holter monitors can be worn for 24 to 48 hours, limiting the time to record any irregular heart rhythms you may have. Zio is able to capture data for the 51% of patients who have their first symptom-triggered arrhythmia after 48 hours.1  LIVE WITHOUT RESTRICTIONS The Zio ambulatory cardiac monitor is a small, unobtrusive, and water-resistant patch--you might even forget you're wearing it. The Zio monitor records and stores every beat of your heart, whether you're sleeping, working out, or showering.  Wear the monitor for 7 days, remove  07/25/21.   Follow-Up: At [facility], you and your health needs are our priority.  As part of our continuing mission to provide you with exceptional heart care, we have created designated Provider Care Teams.  These Care Teams include your primary Cardiologist (physician) and Advanced Practice Providers (APPs -  Physician Assistants and Nurse Practitioners) who all work together to provide you with the care you need, when you need it.  We recommend signing up for the patient portal called "MyChart".  Sign up information is provided on this After Visit Summary.  MyChart is used to connect with patients for Virtual Visits (Telemedicine).  Patients are able to view lab/test results, encounter notes, upcoming appointments, etc.  Non-urgent messages can be sent to your provider as well.   To learn more about what you can do with MyChart, go to [email or web address].    Your next appointment:   3 month(s)  The format for your next appointment:   In Person  Provider:   Jyl Heinz, MD   Other Instructions NA

## 2021-07-19 ENCOUNTER — Other Ambulatory Visit: Payer: Self-pay | Admitting: Family Medicine

## 2021-07-19 DIAGNOSIS — F419 Anxiety disorder, unspecified: Secondary | ICD-10-CM

## 2021-07-19 NOTE — Telephone Encounter (Signed)
Requesting: diazepam Contract: 08/22/20 UDS: 08/22/20 Last Visit: 07/13/21 Next Visit: 10/12/21 Last Refill: 12/05/20  Please Advise

## 2021-07-24 ENCOUNTER — Telehealth: Payer: Self-pay

## 2021-07-24 NOTE — Telephone Encounter (Signed)
Pts daughter says they are wanting to have the pts mailbox moved and they will need a letter explaining that she can not walk down the steps and driveway to get to where it is now to have it moved.   She says they will be coming to have the doppler done Friday- they can pick it up then.

## 2021-07-25 ENCOUNTER — Telehealth: Payer: Self-pay | Admitting: Family Medicine

## 2021-07-25 DIAGNOSIS — R002 Palpitations: Secondary | ICD-10-CM

## 2021-07-25 NOTE — Telephone Encounter (Signed)
Pt states that she is needing Dr. B to write a letter for her postmaster. Pt states she has to go down seven steps to get to her mailbox and she has fell. Pt is trying to get her mailbox moved closer to her driveway. Postmaster states she has to have documentation. Pt would like Dr. B to write her a letter informing them of her age and her health and about her legs. States she has to come up here on Friday and would like to pick it up if possible . Please call when letter is available for pick up.

## 2021-07-26 NOTE — Telephone Encounter (Signed)
Letter written waiting on signature

## 2021-07-27 NOTE — Telephone Encounter (Signed)
Letter placed up front  

## 2021-07-28 ENCOUNTER — Ambulatory Visit (HOSPITAL_BASED_OUTPATIENT_CLINIC_OR_DEPARTMENT_OTHER)
Admission: RE | Admit: 2021-07-28 | Discharge: 2021-07-28 | Disposition: A | Discharge status: Home / Self Care | Payer: Medicare Other | Source: Ambulatory Visit | Attending: Family Medicine | Admitting: Family Medicine

## 2021-07-28 ENCOUNTER — Other Ambulatory Visit: Payer: Self-pay

## 2021-07-28 DIAGNOSIS — R0602 Shortness of breath: Secondary | ICD-10-CM | POA: Insufficient documentation

## 2021-07-28 DIAGNOSIS — I10 Essential (primary) hypertension: Secondary | ICD-10-CM | POA: Diagnosis not present

## 2021-07-28 DIAGNOSIS — E1142 Type 2 diabetes mellitus with diabetic polyneuropathy: Secondary | ICD-10-CM

## 2021-07-28 DIAGNOSIS — W19XXXA Unspecified fall, initial encounter: Secondary | ICD-10-CM | POA: Diagnosis not present

## 2021-07-28 DIAGNOSIS — R55 Syncope and collapse: Secondary | ICD-10-CM | POA: Diagnosis not present

## 2021-07-28 DIAGNOSIS — R42 Dizziness and giddiness: Secondary | ICD-10-CM | POA: Diagnosis not present

## 2021-07-28 DIAGNOSIS — I6521 Occlusion and stenosis of right carotid artery: Secondary | ICD-10-CM | POA: Insufficient documentation

## 2021-07-31 ENCOUNTER — Other Ambulatory Visit: Payer: Self-pay | Admitting: *Deleted

## 2021-07-31 DIAGNOSIS — R002 Palpitations: Secondary | ICD-10-CM | POA: Diagnosis not present

## 2021-07-31 DIAGNOSIS — I6521 Occlusion and stenosis of right carotid artery: Secondary | ICD-10-CM

## 2021-08-02 ENCOUNTER — Telehealth: Payer: Self-pay

## 2021-08-02 NOTE — Telephone Encounter (Signed)
-----   Message from Jenean Lindau, MD sent at 08/02/2021  1:56 PM EDT ----- The results of the study is unremarkable. Please inform patient. I will discuss in detail at next appointment. Cc  primary care/referring physician Jenean Lindau, MD 08/02/2021 1:56 PM

## 2021-08-02 NOTE — Telephone Encounter (Signed)
Patient notified of results and results sent to PCP

## 2021-08-03 ENCOUNTER — Ambulatory Visit (HOSPITAL_BASED_OUTPATIENT_CLINIC_OR_DEPARTMENT_OTHER)
Admission: RE | Admit: 2021-08-03 | Discharge: 2021-08-03 | Disposition: A | Discharge status: Home / Self Care | Payer: Medicare Other | Source: Ambulatory Visit | Attending: Family Medicine | Admitting: Family Medicine

## 2021-08-03 ENCOUNTER — Other Ambulatory Visit: Payer: Self-pay

## 2021-08-03 DIAGNOSIS — R0602 Shortness of breath: Secondary | ICD-10-CM | POA: Diagnosis not present

## 2021-08-03 LAB — ECHOCARDIOGRAM COMPLETE
AV Mean grad: 3 mmHg
AV Peak grad: 5.9 mmHg
Ao pk vel: 1.21 m/s
Area-P 1/2: 3.28 cm2
Calc EF: 64.7 %
MV M vel: 4.13 m/s
MV Peak grad: 68.2 mmHg
S' Lateral: 2.92 cm
Single Plane A2C EF: 59.9 %
Single Plane A4C EF: 67.6 %

## 2021-08-03 NOTE — Progress Notes (Signed)
*  PRELIMINARY RESULTS* Echocardiogram 2D Echocardiogram has been performed.  Luisa Hart RDCS 08/03/2021, 2:52 PM

## 2021-08-11 ENCOUNTER — Encounter: Payer: Self-pay | Admitting: *Deleted

## 2021-08-14 IMAGING — DX DG CERVICAL SPINE COMPLETE 4+V
7 series · 7 of 7 positions shown · non-contrast
Comparison: 02/11/2017

CLINICAL DATA: Neck pain after fall

EXAM:
CERVICAL SPINE - COMPLETE 4+ VIEW

[c-spine lat]
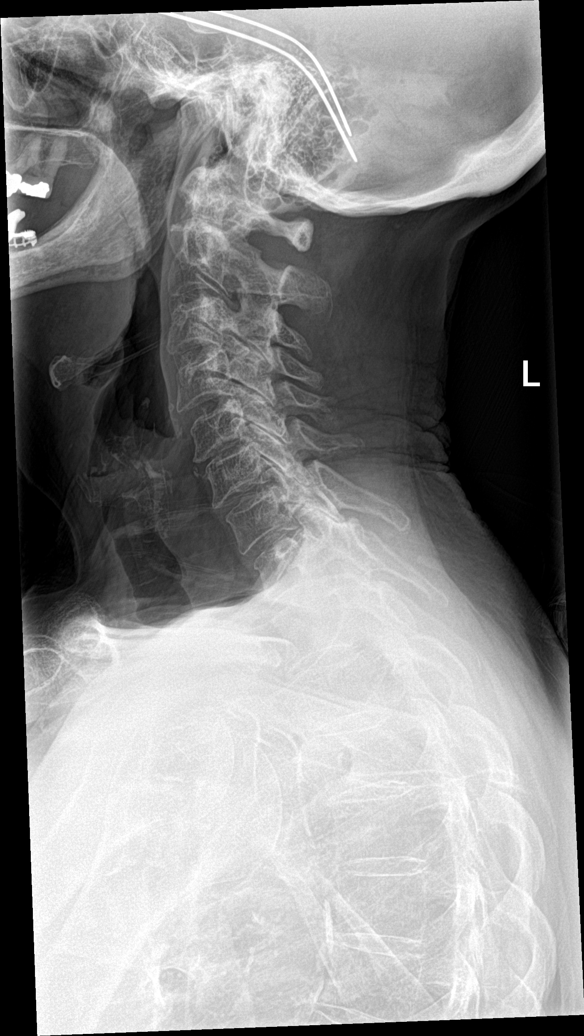

[c-spine obl (1 of 2)]
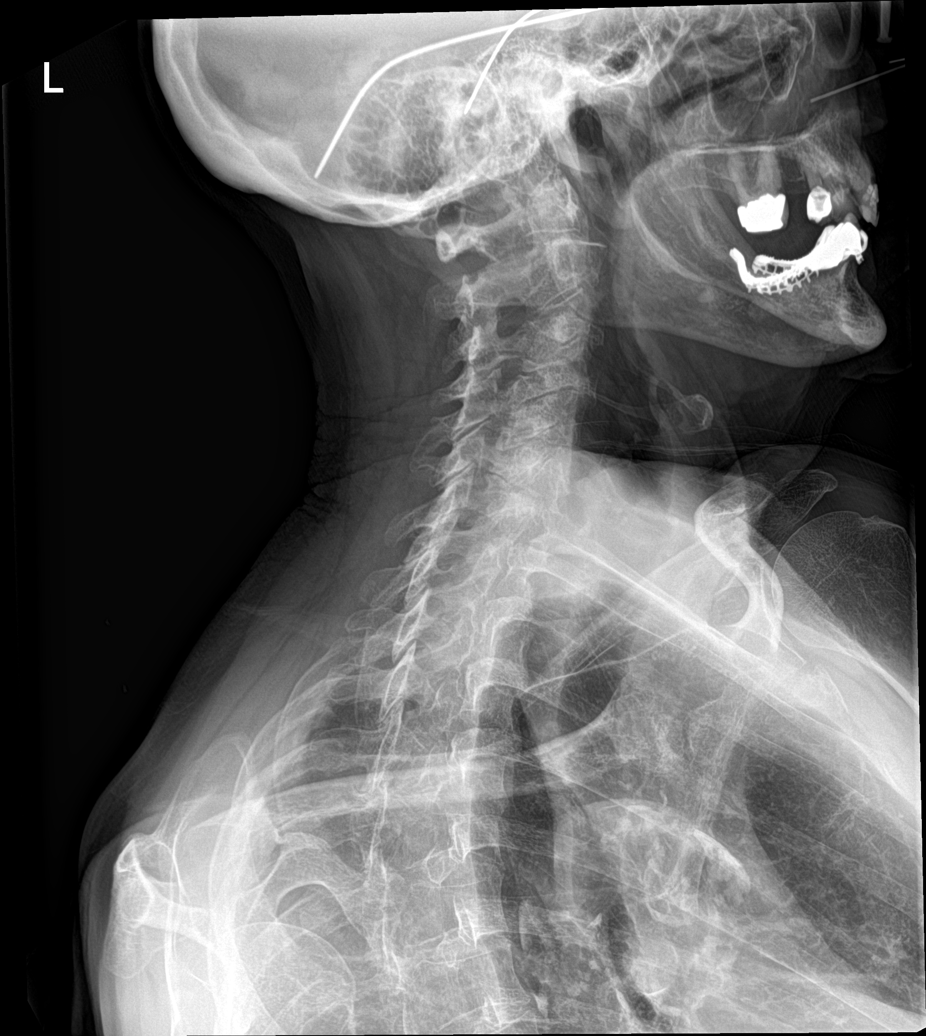

[c-spine obl (2 of 2)]
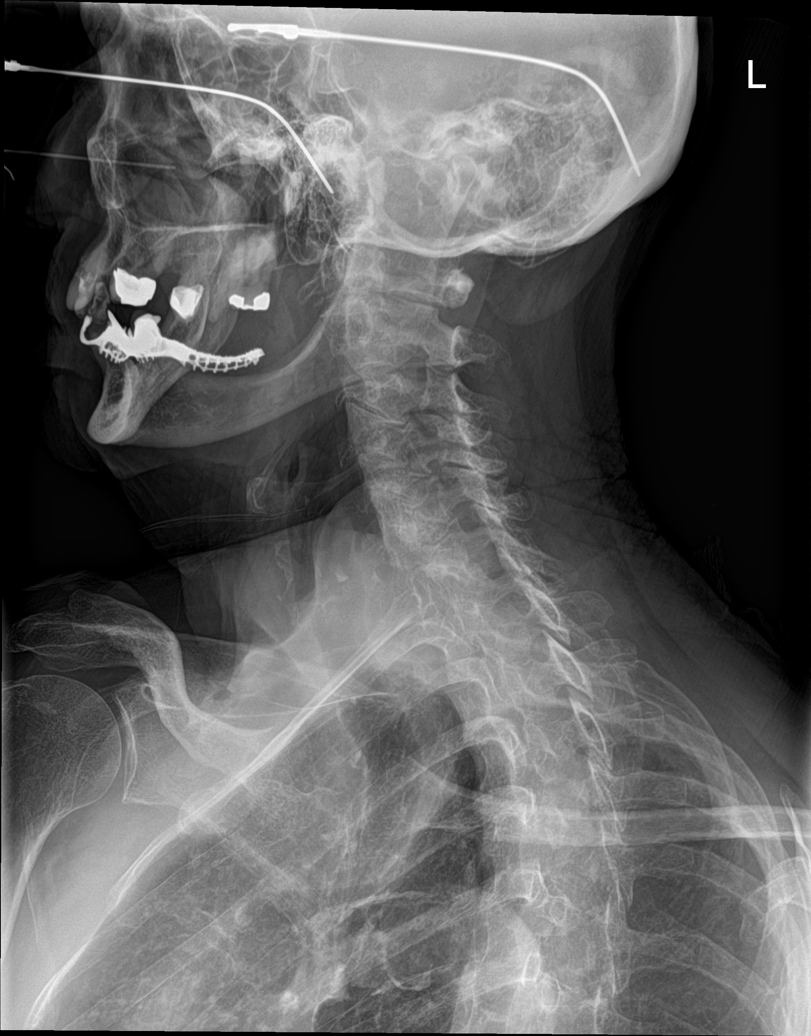

[c-spine ap]
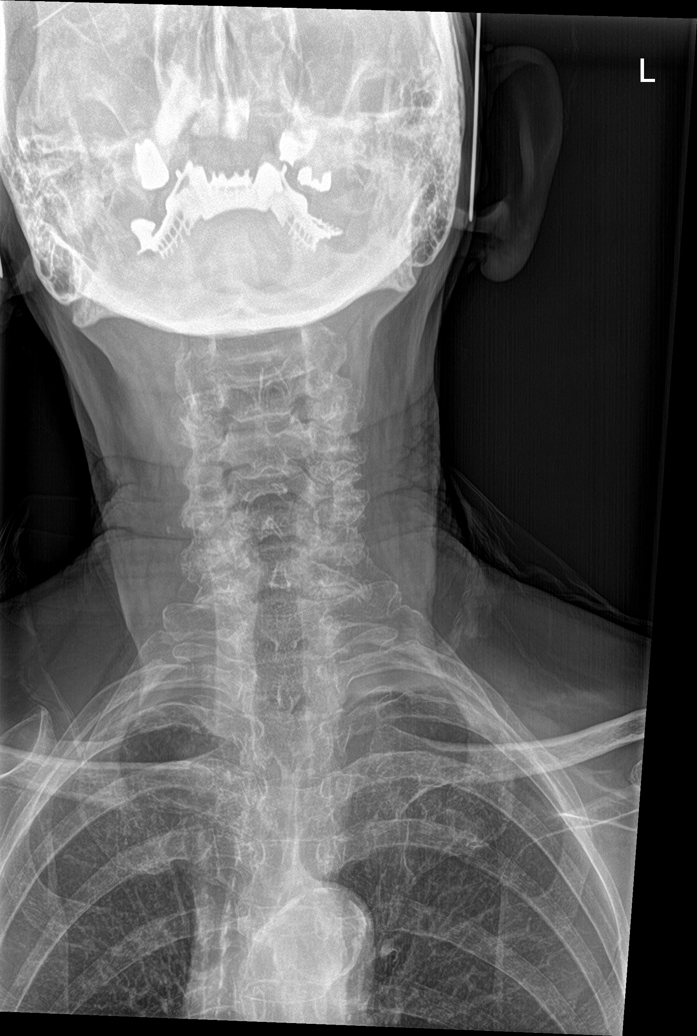

[c-spine open mouth]
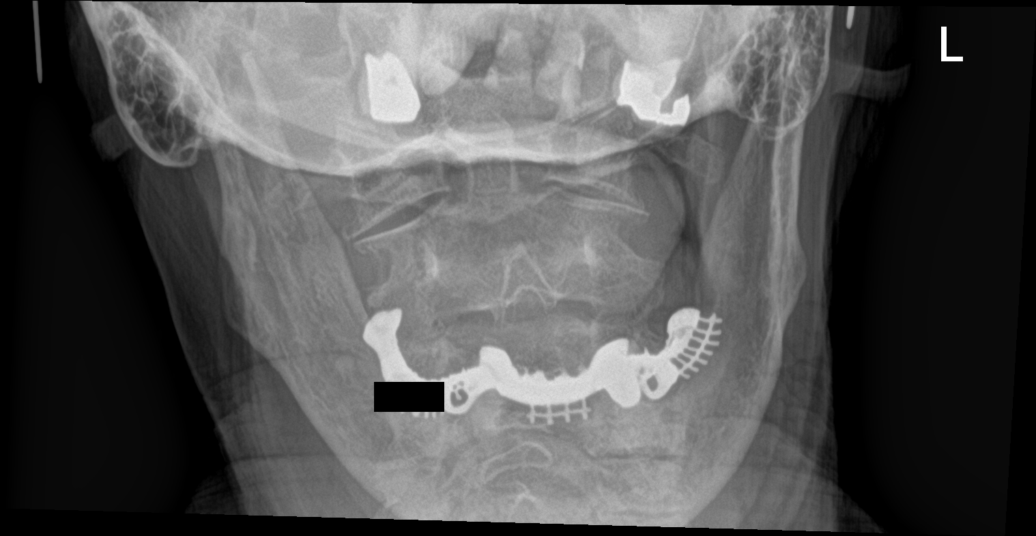

[c-spine swimmers]
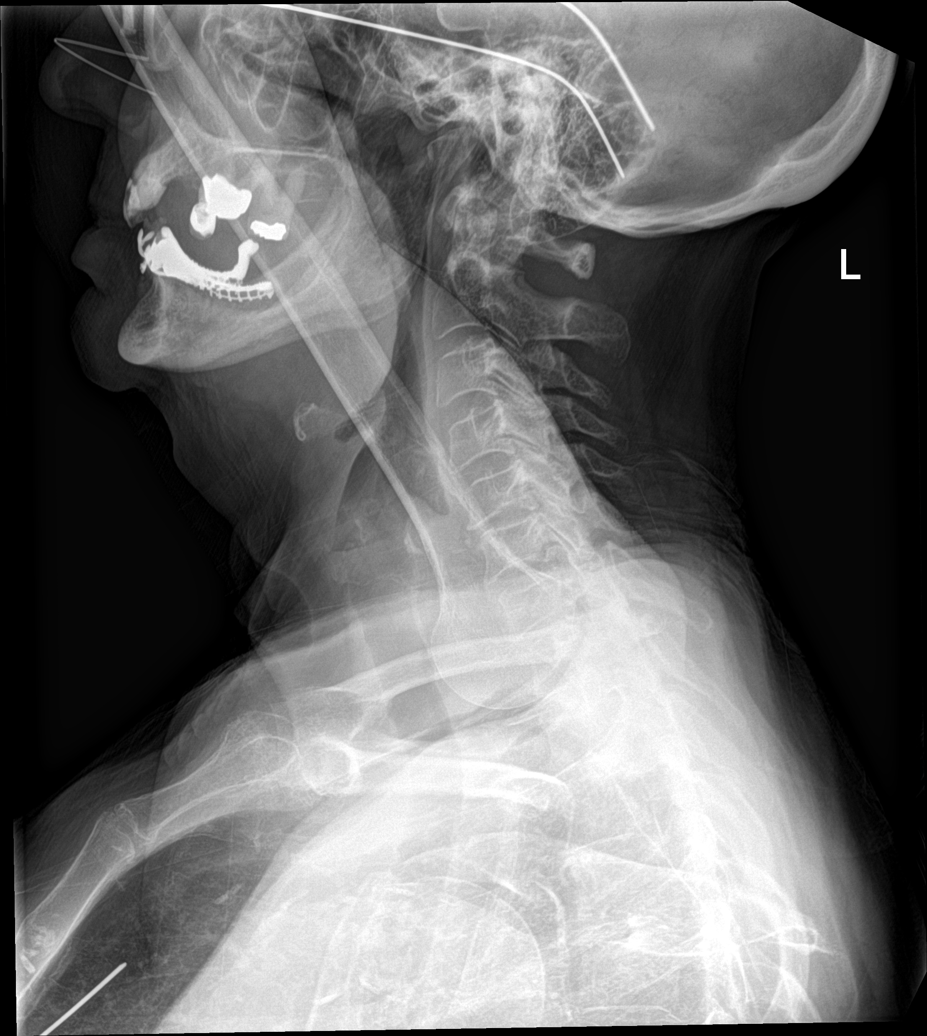

[[person_name]]
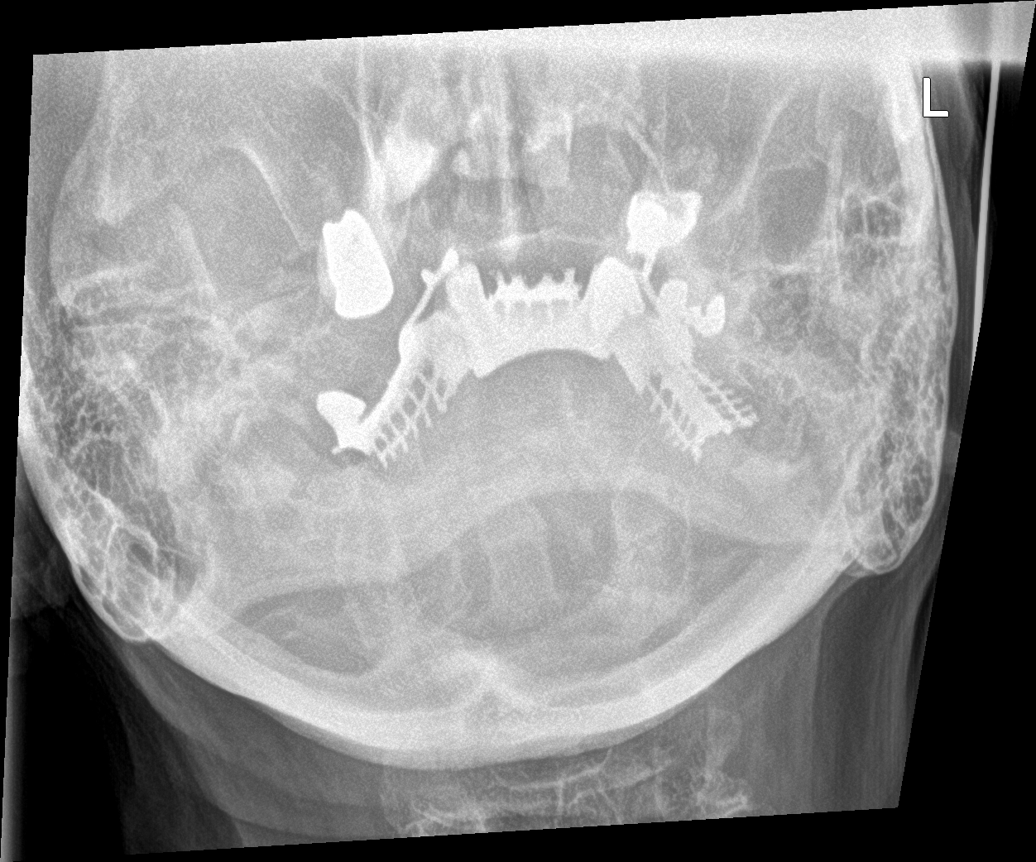

[7 of 7 positions shown; findings below may reference images not displayed]

FINDINGS: Cervical alignment within normal limits. Vertebral body heights are
maintained. Mild disc space narrowing at C5-C6. Dens and lateral
masses are within normal limits. Mild bilateral foraminal narrowing
at C3-C4, C4-C5 and C5-C6. Facet degenerative changes at multiple
levels
IMPRESSION: Mild multilevel degenerative change.  No acute osseous abnormality

## 2021-08-16 ENCOUNTER — Ambulatory Visit: Payer: Medicare Other | Admitting: Cardiology

## 2021-08-26 ENCOUNTER — Other Ambulatory Visit: Payer: Self-pay | Admitting: Family Medicine

## 2021-08-28 ENCOUNTER — Other Ambulatory Visit: Payer: Self-pay | Admitting: Family Medicine

## 2021-08-30 DIAGNOSIS — E119 Type 2 diabetes mellitus without complications: Secondary | ICD-10-CM | POA: Diagnosis not present

## 2021-09-19 ENCOUNTER — Other Ambulatory Visit: Payer: Self-pay | Admitting: Family Medicine

## 2021-09-21 ENCOUNTER — Ambulatory Visit: Payer: Medicare Other | Admitting: Cardiology

## 2021-09-27 ENCOUNTER — Telehealth: Payer: Self-pay | Admitting: Family Medicine

## 2021-09-27 ENCOUNTER — Other Ambulatory Visit: Payer: Self-pay | Admitting: Family Medicine

## 2021-09-27 MED ORDER — TRAMADOL HCL 50 MG PO TABS: 50.0000 mg | ORAL_TABLET | Freq: Two times a day (BID) | ORAL | 0 refills | Status: AC | PRN

## 2021-09-27 NOTE — Telephone Encounter (Signed)
Patient is calling in regard of her hip/leg pain, she states that the ibuprofen she was told to take is not helping and would like to be prescribed something for pain. Patient also stated that the medicine for muscle spasms are not helping. Offered to make an OV but declined since she has one 10/13. Please advice.

## 2021-09-28 NOTE — Telephone Encounter (Signed)
Pt aware.

## 2021-10-12 ENCOUNTER — Other Ambulatory Visit: Payer: Self-pay

## 2021-10-12 ENCOUNTER — Encounter: Payer: Self-pay | Admitting: Family Medicine

## 2021-10-12 ENCOUNTER — Ambulatory Visit (INDEPENDENT_AMBULATORY_CARE_PROVIDER_SITE_OTHER): Payer: Medicare Other | Admitting: Family Medicine

## 2021-10-12 VITALS — BP 110/62 | HR 58 | Temp 97.8°F | Resp 16 | Wt 132.6 lb

## 2021-10-12 DIAGNOSIS — E1110 Type 2 diabetes mellitus with ketoacidosis without coma: Secondary | ICD-10-CM | POA: Diagnosis not present

## 2021-10-12 DIAGNOSIS — Z79899 Other long term (current) drug therapy: Secondary | ICD-10-CM

## 2021-10-12 DIAGNOSIS — N189 Chronic kidney disease, unspecified: Secondary | ICD-10-CM

## 2021-10-12 DIAGNOSIS — I10 Essential (primary) hypertension: Secondary | ICD-10-CM | POA: Diagnosis not present

## 2021-10-12 DIAGNOSIS — E1142 Type 2 diabetes mellitus with diabetic polyneuropathy: Secondary | ICD-10-CM | POA: Diagnosis not present

## 2021-10-12 DIAGNOSIS — Z23 Encounter for immunization: Secondary | ICD-10-CM | POA: Diagnosis not present

## 2021-10-12 DIAGNOSIS — E782 Mixed hyperlipidemia: Secondary | ICD-10-CM | POA: Diagnosis not present

## 2021-10-12 DIAGNOSIS — G894 Chronic pain syndrome: Secondary | ICD-10-CM | POA: Diagnosis not present

## 2021-10-12 DIAGNOSIS — K219 Gastro-esophageal reflux disease without esophagitis: Secondary | ICD-10-CM

## 2021-10-12 DIAGNOSIS — E785 Hyperlipidemia, unspecified: Secondary | ICD-10-CM

## 2021-10-12 DIAGNOSIS — R3911 Hesitancy of micturition: Secondary | ICD-10-CM | POA: Diagnosis not present

## 2021-10-12 DIAGNOSIS — E538 Deficiency of other specified B group vitamins: Secondary | ICD-10-CM | POA: Diagnosis not present

## 2021-10-12 DIAGNOSIS — M8580 Other specified disorders of bone density and structure, unspecified site: Secondary | ICD-10-CM | POA: Diagnosis not present

## 2021-10-12 MED ORDER — VENLAFAXINE HCL ER 37.5 MG PO CP24: 37.5000 mg | ORAL_CAPSULE | Freq: Every day | ORAL | 3 refills | Status: DC

## 2021-10-12 MED ORDER — TIZANIDINE HCL 4 MG PO TABS: 4.0000 mg | ORAL_TABLET | Freq: Two times a day (BID) | ORAL | 1 refills | Status: DC | PRN

## 2021-10-12 NOTE — Patient Instructions (Signed)
Peripheral Neuropathy ?Peripheral neuropathy is a type of nerve damage. It affects nerves that carry signals between the spinal cord and the arms, legs, and the rest of the body (peripheral nerves). It does not affect nerves in the spinal cord or brain. In peripheral neuropathy, one nerve or a group of nerves may be damaged. Peripheral neuropathy is a broad category that includes many specific nerve disorders, like diabetic neuropathy, hereditary neuropathy, and carpal tunnel syndrome. ?What are the causes? ?This condition may be caused by: ?Diabetes. This is the most common cause of peripheral neuropathy. ?Nerve injury. ?Pressure or stress on a nerve that lasts a long time. ?Lack (deficiency) of B vitamins. This can result from alcoholism, poor diet, or a restricted diet. ?Infections. ?Autoimmune diseases, such as rheumatoid arthritis and systemic lupus erythematosus. ?Nerve diseases that are passed from parent to child (inherited). ?Some medicines, such as cancer medicines (chemotherapy). ?Poisonous (toxic) substances, such as lead and mercury. ?Too little blood flowing to the legs. ?Kidney disease. ?Thyroid disease. ?In some cases, the cause of this condition is not known. ?What are the signs or symptoms? ?Symptoms of this condition depend on which of your nerves is damaged. Common symptoms include: ?Loss of feeling (numbness) in the feet, hands, or both. ?Tingling in the feet, hands, or both. ?Burning pain. ?Very sensitive skin. ?Weakness. ?Not being able to move a part of the body (paralysis). ?Muscle twitching. ?Clumsiness or poor coordination. ?Loss of balance. ?Not being able to control your bladder. ?Feeling dizzy. ?Sexual problems. ?How is this diagnosed? ?Diagnosing and finding the cause of peripheral neuropathy can be difficult. Your health care provider will take your medical history and do a physical exam. A neurological exam will also be done. This involves checking things that are affected by your  brain, spinal cord, and nerves (nervous system). For example, your health care provider will check your reflexes, how you move, and what you can feel. ?You may have other tests, such as: ?Blood tests. ?Electromyogram (EMG) and nerve conduction tests. These tests check nerve function and how well the nerves are controlling the muscles. ?Imaging tests, such as CT scans or MRI to rule out other causes of your symptoms. ?Removing a small piece of nerve to be examined in a lab (nerve biopsy). ?Removing and examining a small amount of the fluid that surrounds the brain and spinal cord (lumbar puncture). ?How is this treated? ?Treatment for this condition may involve: ?Treating the underlying cause of the neuropathy, such as diabetes, kidney disease, or vitamin deficiencies. ?Stopping medicines that can cause neuropathy, such as chemotherapy. ?Medicine to help relieve pain. Medicines may include: ?Prescription or over-the-counter pain medicine. ?Antiseizure medicine. ?Antidepressants. ?Pain-relieving patches that are applied to painful areas of skin. ?Surgery to relieve pressure on a nerve or to destroy a nerve that is causing pain. ?Physical therapy to help improve movement and balance. ?Devices to help you move around (assistive devices). ?Follow these instructions at home: ?Medicines ?Take over-the-counter and prescription medicines only as told by your health care provider. Do not take any other medicines without first asking your health care provider. ?Do not drive or use heavy machinery while taking prescription pain medicine. ?Lifestyle ? ?Do not use any products that contain nicotine or tobacco, such as cigarettes and e-cigarettes. Smoking keeps blood from reaching damaged nerves. If you need help quitting, ask your health care provider. ?Avoid or limit alcohol. Too much alcohol can cause a vitamin B deficiency, and vitamin B is needed for healthy nerves. ?Eat a   healthy diet. This includes: ?Eating foods that are  high in fiber, such as fresh fruits and vegetables, whole grains, and beans. ?Limiting foods that are high in fat and processed sugars, such as fried or sweet foods. ?General instructions ? ?If you have diabetes, work closely with your health care provider to keep your blood sugar under control. ?If you have numbness in your feet: ?Check every day for signs of injury or infection. Watch for redness, warmth, and swelling. ?Wear padded socks and comfortable shoes. These help protect your feet. ?Develop a good support system. Living with peripheral neuropathy can be stressful. Consider talking with a mental health specialist or joining a support group. ?Use assistive devices and attend physical therapy as told by your health care provider. This may include using a walker or a cane. ?Keep all follow-up visits as told by your health care provider. This is important. ?Contact a health care provider if: ?You have new signs or symptoms of peripheral neuropathy. ?You are struggling emotionally from dealing with peripheral neuropathy. ?Your pain is not well-controlled. ?Get help right away if: ?You have an injury or infection that is not healing normally. ?You develop new weakness in an arm or leg. ?You have fallen or do so frequently. ?Summary ?Peripheral neuropathy is when the nerves in the arms, or legs are damaged, resulting in numbness, weakness, or pain. ?There are many causes of peripheral neuropathy, including diabetes, pinched nerves, vitamin deficiencies, autoimmune disease, and hereditary conditions. ?Diagnosing and finding the cause of peripheral neuropathy can be difficult. Your health care provider will take your medical history, do a physical exam, and do tests, including blood tests and nerve function tests. ?Treatment involves treating the underlying cause of the neuropathy and taking medicines to help control pain. Physical therapy and assistive devices may also help. ?This information is not intended to  replace advice given to you by your health care provider. Make sure you discuss any questions you have with your health care provider. ?Document Revised: 09/27/2020 Document Reviewed: 09/27/2020 ?Elsevier Patient Education ? 2022 [employer] ? ?

## 2021-10-12 NOTE — Progress Notes (Signed)
Patient ID: Dana Burns, female    DOB: February 28, 1926  Age: 87 y.o. MRN: [id number]    Subjective:   Chief Complaint  Patient presents with   2 months follow up    Leg issues    Subjective   HPI [name] presents for office visit today for follow up on HTN and type 2 diabetes. She reports worsening stinging, soreness, and burning sensation in legs bilaterally. She states that tramadol did not help with the pain, however tylenol and Tizanidine  helped alleviate symptoms. Denies CP/palp/SOB/HA/congestion/fevers/GI or GU c/o. Taking meds as prescribed.   Review of Systems  Constitutional:  Negative for chills, fatigue and fever.  HENT:  Negative for congestion, rhinorrhea, sinus pressure, sinus pain and sore throat.   Eyes:  Negative for pain.  Respiratory:  Negative for cough and shortness of breath.   Cardiovascular:  Negative for chest pain, palpitations and leg swelling.  Gastrointestinal:  Negative for abdominal pain, blood in stool, diarrhea, nausea and vomiting.  Genitourinary:  Negative for decreased urine volume, flank pain, frequency, vaginal bleeding and vaginal discharge.  Musculoskeletal:  Positive for arthralgias. Negative for back pain.  Neurological:  Negative for headaches.   History Past Medical History:  Diagnosis Date   Abdominal pain 10/18/17   Allergy    sneeze, rhinorrhea in am   Anxiety state 09/03/2013   Breast mass, right 10/14/2012   Chicken pox as a child   Chronic pain syndrome 01/23/2015   Chronic renal insufficiency 03/17/2011   Colon cancer ([place])    Complication of anesthesia    agitated when waking up, wakes up shaking, "like  I'm going into shock"   Constipation 2017-10-18   Depression    husband died  3 months ago, pt. admits depression  currently    Diabetic neuropathy ([place]) 03/17/2011   DM (diabetes mellitus) type 2, uncontrolled, with ketoacidosis ([place]) 03/17/2011   Ductal hyperplasia of breast 03/17/2011   Essential hypertension,  benign 05/31/2010   Qualifier: Diagnosis of  By: Percival Spanish, MD, Bethann Goo 07/15/2021   Fibromyalgia    GERD (gastroesophageal reflux disease)    H/O echocardiogram    done /w [employer] in 2011, saw Dr. Percival Spanish for tachycardia    Headache    History of colon cancer 03/17/2011   Hyperlipidemia    6 years   Hyperlipidemia, mixed    6 years   Hypertension    15 years, reports she has a rapid heartrate    Loss of weight 12/14/2013   Measles as a child   Osteoarthritis (arthritis due to wear and tear of joints) 03/17/2011   Noted diffusely including neck   Osteopenia 12/14/2013   Pain in finger of right hand 05/15/2017   Pain in joint, lower leg 09/03/2013   Pain of right shoulder region 07/07/2014   Peripheral neuropathy    SOB (shortness of breath) 05/31/2010   Qualifier: Diagnosis of  By: Percival Spanish, MD, Farrel Gordon     Stress 06/01/2013   Tachycardia 05/31/2010   Qualifier: Diagnosis of  By: Percival Spanish, MD, Farrel Gordon     Type 2 diabetes mellitus with peripheral neuropathy ([place]) 03/17/2011   Vitamin B 12 deficiency 07/07/2014   intrinsic factor positive    She has a past surgical history that includes Cataract extraction; Colon surgery; Nodule removed; Breast surgery (2013); and Breast excisional biopsy (Right, 2013).   Her family history includes Arthritis in her sister; COPD in her brother; Cancer in her  brother, brother, brother, father, mother, and sister; Emphysema in her brother; Fibromyalgia in her daughter; Neuropathy in her daughter; Other in her brother.She reports that she has never smoked. She has never used smokeless tobacco. She reports that she does not drink alcohol and does not use drugs.  Current Outpatient Medications on File Prior to Visit  Medication Sig Dispense Refill   acetaminophen (TYLENOL) 500 MG tablet Take 500 mg by mouth every 6 (six) hours as needed for pain.     calcium carbonate (OS-CAL) 600 MG TABS Take 600 mg by mouth 2 (two) times daily with a meal.      Cholecalciferol (VITAMIN D3) 50 MCG (2000 UT) capsule Take 2,000 Units by mouth daily.      citalopram (CELEXA) 20 MG tablet TAKE 1 TABLET BY MOUTH EVERY DAY 90 tablet 2   Cyanocobalamin (B-12 IJ) Place 1,000 mcg under the tongue daily.     diazepam (VALIUM) 5 MG tablet TAKE 1/2 TO 1 TABLET DAILY AS NEEDED FOR ANXIETY 30 tablet 1   gabapentin (NEURONTIN) 800 MG tablet Take 1 tablet (800 mg total) by mouth 3 (three) times daily. 270 tablet 1   metFORMIN (GLUCOPHAGE) 500 MG tablet TAKE 1 TABLET BY MOUTH EVERY DAY WITH BREAKFAST 90 tablet 1   pantoprazole (PROTONIX) 40 MG tablet TAKE 1 TABLET BY MOUTH EVERY DAY 90 tablet 1   No current facility-administered medications on file prior to visit.     Objective:  Objective  Physical Exam Constitutional:      General: She is not in acute distress.    Appearance: Normal appearance. She is not ill-appearing or toxic-appearing.  HENT:     Head: Normocephalic and atraumatic.     Right Ear: Tympanic membrane, ear canal and external ear normal.     Left Ear: Tympanic membrane, ear canal and external ear normal.     Nose: No congestion or rhinorrhea.  Eyes:     Extraocular Movements: Extraocular movements intact.     Pupils: Pupils are equal, round, and reactive to light.  Cardiovascular:     Rate and Rhythm: Normal rate and regular rhythm.     Pulses: Normal pulses.     Heart sounds: Normal heart sounds. No murmur heard. Pulmonary:     Effort: Pulmonary effort is normal. No respiratory distress.     Breath sounds: Normal breath sounds. No wheezing, rhonchi or rales.  Abdominal:     General: Bowel sounds are normal.     Palpations: Abdomen is soft. There is no mass.     Tenderness: There is no abdominal tenderness. There is no guarding.     Hernia: No hernia is present.  Musculoskeletal:        General: Normal range of motion.     Cervical back: Normal range of motion and neck supple.  Skin:    General: Skin is warm and dry.   Neurological:     Mental Status: She is alert and oriented to person, place, and time.  Psychiatric:        Behavior: Behavior normal.   BP 110/62   Pulse (!) 58   Temp 97.8 F (36.6 C)   Resp 16   Wt 132 lb 9.6 oz (60.1 kg)   SpO2 96%   BMI 21.40 kg/m  Wt Readings from Last 3 Encounters:  10/12/21 132 lb 9.6 oz (60.1 kg)  07/26/21 139 lb (63 kg)  07/18/21 138 lb 1.3 oz (62.6 kg)     Lab Results  Component Value Date   WBC 4.9 03/28/2021   HGB 12.7 03/28/2021   HCT 39.0 03/28/2021   PLT 240.0 03/28/2021   GLUCOSE 108 (H) 03/28/2021   CHOL 270 (H) 03/28/2021   TRIG 150.0 (H) 03/28/2021   HDL 44.80 03/28/2021   LDLDIRECT 120.0 04/12/2020   LDLCALC 195 (H) 03/28/2021   ALT 7 03/28/2021   AST 14 03/28/2021   NA 140 03/28/2021   K 4.9 03/28/2021   CL 102 03/28/2021   CREATININE 1.29 (H) 03/28/2021   BUN 23 03/28/2021   CO2 33 (H) 03/28/2021   TSH 3.09 03/28/2021   HGBA1C 6.6 (H) 03/28/2021   MICROALBUR 1.0 03/28/2021    ECHOCARDIOGRAM COMPLETE  Result Date: 08/03/2021    ECHOCARDIOGRAM REPORT   Patient Name:   SHALONDRA WUNSCHEL Date of Exam: 08/03/2021 Medical Rec #:  [id number]     Height:       66.0 in Accession #:    [id number]    Weight:       138.1 lb Date of Birth:  1926/09/17     BSA:          1.708 m Patient Age:    7 years      BP:           151/72 mmHg Patient Gender: F             HR:           67 bpm. Exam Location:  [facility] Procedure: 2D Echo, Cardiac Doppler and Color Doppler Indications:    SOB  History:        Patient has no prior history of Echocardiogram examinations.                 Arrythmias:Tachycardia, Signs/Symptoms:Shortness of Breath; Risk                 Factors:Hypertension, Diabetes and Dyslipidemia.  Sonographer:    Luisa Hart RDCS Referring Phys: [place] Comments: Technically difficult study due to poor echo windows. IMPRESSIONS  1. Left ventricular ejection fraction, by estimation, is 55 to 60%. The left ventricle has  normal function. Left ventricular endocardial border not optimally defined to evaluate regional wall motion. There is severe concentric left ventricular hypertrophy. Left ventricular diastolic parameters are consistent with Grade III diastolic dysfunction (restrictive).  2. Right ventricular systolic function is normal. The right ventricular size is normal. There is normal pulmonary artery systolic pressure.  3. The mitral valve is normal in structure. Trivial mitral valve regurgitation. No evidence of mitral stenosis.  4. The aortic valve is normal in structure. Aortic valve regurgitation is not visualized. No aortic stenosis is present.  5. The inferior vena cava is normal in size with greater than 50% respiratory variability, suggesting right atrial pressure of 3 mmHg. Comparison(s): No prior Echocardiogram. FINDINGS  Left Ventricle: Left ventricular ejection fraction, by estimation, is 55 to 60%. The left ventricle has normal function. Left ventricular endocardial border not optimally defined to evaluate regional wall motion. The left ventricular internal cavity size was normal in size. There is severe concentric left ventricular hypertrophy. Left ventricular diastolic parameters are consistent with Grade III diastolic dysfunction (restrictive). Right Ventricle: The right ventricular size is normal. No increase in right ventricular wall thickness. Right ventricular systolic function is normal. There is normal pulmonary artery systolic pressure. The tricuspid regurgitant velocity is 2.05 m/s, and  with an assumed right atrial pressure of 3 mmHg, the estimated right  ventricular systolic pressure is 21.9 mmHg. Left Atrium: Left atrial size was normal in size. Right Atrium: Right atrial size was normal in size. Pericardium: There is no evidence of pericardial effusion. Presence of pericardial fat pad. Mitral Valve: The mitral valve is normal in structure. Trivial mitral valve regurgitation. No evidence of mitral  valve stenosis. Tricuspid Valve: The tricuspid valve is normal in structure. Tricuspid valve regurgitation is trivial. No evidence of tricuspid stenosis. Aortic Valve: The aortic valve is normal in structure. Aortic valve regurgitation is not visualized. No aortic stenosis is present. Aortic valve mean gradient measures 3.0 mmHg. Aortic valve peak gradient measures 5.9 mmHg. Pulmonic Valve: The pulmonic valve was normal in structure. Pulmonic valve regurgitation is not visualized. No evidence of pulmonic stenosis. Aorta: The ascending aorta was not well visualized and the aortic root is normal in size and structure. Venous: The inferior vena cava is normal in size with greater than 50% respiratory variability, suggesting right atrial pressure of 3 mmHg. IAS/Shunts: No atrial level shunt detected by color flow Doppler.  LEFT VENTRICLE PLAX 2D LVIDd:         2.98 cm     Diastology LVIDs:         2.92 cm     LV e' medial:    3.59 cm/s LV PW:         1.66 cm     LV E/e' medial:  18.1 LV IVS:        1.48 cm     LV e' lateral:   5.22 cm/s                            LV E/e' lateral: 12.4  LV Volumes (MOD) LV vol d, MOD A2C: 44.9 ml LV vol d, MOD A4C: 35.2 ml LV vol s, MOD A2C: 18.0 ml LV vol s, MOD A4C: 11.4 ml LV SV MOD A2C:     26.9 ml LV SV MOD A4C:     35.2 ml LV SV MOD BP:      27.3 ml RIGHT VENTRICLE RV Basal diam:  3.43 cm  PULMONARY VEINS RV Mid diam:    2.15 cm  A Reversal Duration: 106.00 msec                          A Reversal Velocity: 33.80 cm/s                          Diastolic Velocity:  75.88 cm/s                          S/D Velocity:        1.30                          Systolic Velocity:   32.54 cm/s LEFT ATRIUM             Index       RIGHT ATRIUM           Index LA Vol (A2C):   64.6 ml 37.81 ml/m RA Area:     13.80 cm LA Vol (A4C):   37.2 ml 21.77 ml/m RA Volume:   33.40 ml  19.55 ml/m LA Biplane Vol: 51.9 ml 30.38 ml/m  AORTIC VALVE  PULMONIC VALVE AV Vmax:           121.00  cm/s PV Vmax:       0.79 m/s AV Vmean:          81.600 cm/s PV Vmean:      56.400 cm/s AV VTI:            0.243 m     PV VTI:        0.155 m AV Peak Grad:      5.9 mmHg    PV Peak grad:  2.5 mmHg AV Mean Grad:      3.0 mmHg    PV Mean grad:  1.0 mmHg LVOT Vmax:         110.00 cm/s LVOT Vmean:        75.500 cm/s LVOT VTI:          0.238 m LVOT/AV VTI ratio: 0.98  AORTA Ao Root diam: 2.70 cm Ao Asc diam:  3.40 cm MITRAL VALVE               TRICUSPID VALVE MV Area (PHT): 3.28 cm    TR Peak grad:   16.8 mmHg MV Decel Time: 231 msec    TR Vmax:        205.00 cm/s MR Peak grad: 68.2 mmHg MR Vmax:      413.00 cm/s  SHUNTS MV E velocity: 64.80 cm/s  Systemic VTI: 0.24 m MV A velocity: 76.00 cm/s MV E/A ratio:  0.85 Kardie Tobb DO Electronically signed by Berniece Salines DO Signature Date/Time: 08/03/2021/4:20:22 PM    Final      Assessment & Plan:  Plan    Meds ordered this encounter  Medications   tiZANidine (ZANAFLEX) 4 MG tablet    Sig: Take 1 tablet (4 mg total) by mouth 2 (two) times daily as needed for muscle spasms.    Dispense:  180 tablet    Refill:  1   venlafaxine XR (EFFEXOR XR) 37.5 MG 24 hr capsule    Sig: Take 1 capsule (37.5 mg total) by mouth daily with breakfast.    Dispense:  30 capsule    Refill:  3    Problem List Items Addressed This Visit     Essential hypertension, benign    Well controlled, no changes to meds. Encouraged heart healthy diet such as the DASH diet and exercise as tolerated.       Type 2 diabetes mellitus with peripheral neuropathy (HCC)   Relevant Medications   tiZANidine (ZANAFLEX) 4 MG tablet   venlafaxine XR (EFFEXOR XR) 37.5 MG 24 hr capsule   Chronic renal insufficiency    Hydrate and monitor      GERD (gastroesophageal reflux disease)   Osteopenia    Encouraged to get adequate exercise, calcium and vitamin d intake      Vitamin B 12 deficiency    Supplement and monitor      Relevant Orders   Vitamin B12   Chronic pain syndrome    Except her  neuropathy her pain is manageable with the Tylenol and Tizanidine prn      Relevant Medications   tiZANidine (ZANAFLEX) 4 MG tablet   venlafaxine XR (EFFEXOR XR) 37.5 MG 24 hr capsule   DM (diabetes mellitus) type 2, uncontrolled, with ketoacidosis (HCC)    hgba1c acceptable, minimize simple carbs. Increase exercise as tolerated. Continue current meds      Relevant Orders   Hemoglobin A1c   Urinary hesitancy  Check UA and culture      Relevant Orders   Urinalysis   Urine Culture   RESOLVED: Hyperlipidemia, mixed   RESOLVED: Hyperlipidemia   Relevant Orders   Lipid panel   RESOLVED: Hypertension   Relevant Orders   CBC   Comprehensive metabolic panel   TSH   Other Visit Diagnoses     Need for influenza vaccination    -  Primary   Relevant Orders   Flu Vaccine QUAD High Dose(Fluad) (Completed)   High risk medication use       Relevant Orders   Drug Monitoring Panel [phone] , Urine       Follow-up: Return in about 12 weeks (around 01/04/2022), or schedule lab next week, then flu visit in 8-12 weeks.  I, Suezanne Jacquet, acting as a scribe for Penni Homans, MD, have documented all relevent documentation on behalf of Penni Homans, MD, as directed by Penni Homans, MD while in the presence of Penni Homans, MD. DO:10/13/21.  I, Mosie Lukes, MD personally performed the services described in this documentation. All medical record entries made by the scribe were at my direction and in my presence. I have reviewed the chart and agree that the record reflects my personal performance and is accurate and complete

## 2021-10-13 DIAGNOSIS — R3911 Hesitancy of micturition: Secondary | ICD-10-CM | POA: Insufficient documentation

## 2021-10-13 NOTE — Assessment & Plan Note (Signed)
Except her neuropathy her pain is manageable with the Tylenol and Tizanidine prn

## 2021-10-13 NOTE — Assessment & Plan Note (Signed)
Supplement and monitor 

## 2021-10-13 NOTE — Assessment & Plan Note (Signed)
hgba1c acceptable, minimize simple carbs. Increase exercise as tolerated. Continue current meds 

## 2021-10-13 NOTE — Assessment & Plan Note (Signed)
Hydrate and monitor 

## 2021-10-13 NOTE — Assessment & Plan Note (Signed)
Encouraged to get adequate exercise, calcium and vitamin d intake 

## 2021-10-13 NOTE — Assessment & Plan Note (Signed)
Worsening pain and burning discussed options and we will try adding Venlafaxine XR 37.5 mg daily and she is encouraged to use topical treatments such as [employer], Biofreeze, Lidocain

## 2021-10-13 NOTE — Assessment & Plan Note (Signed)
Check UA and culture 

## 2021-10-13 NOTE — Assessment & Plan Note (Signed)
Well controlled, no changes to meds. Encouraged heart healthy diet such as the DASH diet and exercise as tolerated.  °

## 2021-10-18 ENCOUNTER — Other Ambulatory Visit: Payer: Self-pay

## 2021-10-18 ENCOUNTER — Other Ambulatory Visit (INDEPENDENT_AMBULATORY_CARE_PROVIDER_SITE_OTHER): Payer: Medicare Other

## 2021-10-18 DIAGNOSIS — E538 Deficiency of other specified B group vitamins: Secondary | ICD-10-CM

## 2021-10-18 DIAGNOSIS — Z79899 Other long term (current) drug therapy: Secondary | ICD-10-CM | POA: Diagnosis not present

## 2021-10-18 DIAGNOSIS — E785 Hyperlipidemia, unspecified: Secondary | ICD-10-CM | POA: Diagnosis not present

## 2021-10-18 DIAGNOSIS — E1110 Type 2 diabetes mellitus with ketoacidosis without coma: Secondary | ICD-10-CM

## 2021-10-18 DIAGNOSIS — R3911 Hesitancy of micturition: Secondary | ICD-10-CM | POA: Diagnosis not present

## 2021-10-18 DIAGNOSIS — I10 Essential (primary) hypertension: Secondary | ICD-10-CM

## 2021-10-18 NOTE — Addendum Note (Signed)
Addended by: Kelle Darting A on: 10/18/2021 04:02 PM   Modules accepted: Orders

## 2021-10-18 NOTE — Addendum Note (Signed)
Addended by: Kelle Darting A on: 10/18/2021 03:33 PM   Modules accepted: Orders

## 2021-10-19 LAB — HEMOGLOBIN A1C: Hgb A1c MFr Bld: 6.3 % (ref 4.6–6.5)

## 2021-10-19 LAB — URINALYSIS
Bilirubin Urine: NEGATIVE
Hgb urine dipstick: NEGATIVE
Leukocytes,Ua: NEGATIVE
Nitrite: NEGATIVE
Specific Gravity, Urine: 1.02 (ref 1.000–1.030)
Total Protein, Urine: NEGATIVE
Urine Glucose: NEGATIVE
Urobilinogen, UA: 0.2 (ref 0.0–1.0)
pH: 6 (ref 5.0–8.0)

## 2021-10-19 LAB — LIPID PANEL
Cholesterol: 237 mg/dL — ABNORMAL HIGH (ref 0–200)
HDL: 44.8 mg/dL (ref >39.00)
LDL Cholesterol: 154 mg/dL — ABNORMAL HIGH (ref 0–99)
NonHDL: 192.01
Total CHOL/HDL Ratio: 5
Triglycerides: 191 mg/dL — ABNORMAL HIGH (ref 0.0–149.0)
VLDL: 38.2 mg/dL (ref 0.0–40.0)

## 2021-10-19 LAB — COMPREHENSIVE METABOLIC PANEL
ALT: 6 U/L (ref 0–35)
AST: 14 U/L (ref 0–37)
Albumin: 4.4 g/dL (ref 3.5–5.2)
Alkaline Phosphatase: 78 U/L (ref 39–117)
BUN: 26 mg/dL — ABNORMAL HIGH (ref 6–23)
CO2: 31 mEq/L (ref 19–32)
Calcium: 9.8 mg/dL (ref 8.4–10.5)
Chloride: 101 mEq/L (ref 96–112)
Creatinine, Ser: 1.55 mg/dL — ABNORMAL HIGH (ref 0.40–1.20)
GFR: 28.69 mL/min — ABNORMAL LOW (ref >60.00)
Glucose, Bld: 89 mg/dL (ref 70–99)
Potassium: 5 mEq/L (ref 3.5–5.1)
Sodium: 139 mEq/L (ref 135–145)
Total Bilirubin: 0.4 mg/dL (ref 0.2–1.2)
Total Protein: 6.9 g/dL (ref 6.0–8.3)

## 2021-10-19 LAB — URINE CULTURE
MICRO NUMBER:: 12523444
SPECIMEN QUALITY:: ADEQUATE

## 2021-10-19 LAB — CBC
HCT: 39.2 % (ref 36.0–46.0)
Hemoglobin: 12.6 g/dL (ref 12.0–15.0)
MCHC: 32.1 g/dL (ref 30.0–36.0)
MCV: 85.9 fl (ref 78.0–100.0)
Platelets: 230 10*3/uL (ref 150.0–400.0)
RBC: 4.57 Mil/uL (ref 3.87–5.11)
RDW: 15.5 % (ref 11.5–15.5)
WBC: 4.8 10*3/uL (ref 4.0–10.5)

## 2021-10-19 LAB — VITAMIN B12: Vitamin B-12: 262 pg/mL (ref 211–911)

## 2021-10-19 LAB — TSH: TSH: 2.32 u[IU]/mL (ref 0.35–5.50)

## 2021-10-20 ENCOUNTER — Other Ambulatory Visit: Payer: Self-pay

## 2021-10-20 DIAGNOSIS — R7989 Other specified abnormal findings of blood chemistry: Secondary | ICD-10-CM

## 2021-10-24 LAB — DRUG MONITORING PANEL 376104, URINE
Alphahydroxyalprazolam: NEGATIVE ng/mL (ref <25)
Alphahydroxymidazolam: NEGATIVE ng/mL (ref <50)
Alphahydroxytriazolam: NEGATIVE ng/mL (ref <50)
Aminoclonazepam: NEGATIVE ng/mL (ref <25)
Amphetamines: NEGATIVE ng/mL (ref <500)
Barbiturates: NEGATIVE ng/mL (ref <300)
Benzodiazepines: POSITIVE ng/mL — AB (ref <100)
Cocaine Metabolite: NEGATIVE ng/mL (ref <150)
Desmethyltramadol: NEGATIVE ng/mL (ref <100)
Hydroxyethylflurazepam: NEGATIVE ng/mL (ref <50)
Lorazepam: NEGATIVE ng/mL (ref <50)
Nordiazepam: 575 ng/mL — ABNORMAL HIGH (ref <50)
Opiates: NEGATIVE ng/mL (ref <100)
Oxazepam: 1754 ng/mL — ABNORMAL HIGH (ref <50)
Oxycodone: NEGATIVE ng/mL (ref <100)
Temazepam: 1358 ng/mL — ABNORMAL HIGH (ref <50)
Tramadol: NEGATIVE ng/mL (ref <100)

## 2021-10-24 LAB — DM TEMPLATE

## 2021-10-25 ENCOUNTER — Telehealth: Payer: Self-pay | Admitting: Family Medicine

## 2021-10-25 ENCOUNTER — Other Ambulatory Visit: Payer: Medicare Other

## 2021-10-25 NOTE — Telephone Encounter (Signed)
Pt has right leg, back, and hip pain which cause pt to not walk properly. Pt thinks medication(venlafaxine XR) caused symptoms. Please advise.

## 2021-10-26 NOTE — Telephone Encounter (Signed)
Left message on machine to call back  

## 2021-10-26 NOTE — Telephone Encounter (Signed)
Patient stated that she will stop the medication and let us know how she does in about 3 weeks.

## 2021-10-31 ENCOUNTER — Ambulatory Visit: Payer: Medicare Other | Admitting: Cardiology

## 2021-11-01 ENCOUNTER — Other Ambulatory Visit (INDEPENDENT_AMBULATORY_CARE_PROVIDER_SITE_OTHER): Payer: Medicare Other

## 2021-11-01 ENCOUNTER — Other Ambulatory Visit: Payer: Self-pay

## 2021-11-01 DIAGNOSIS — R7989 Other specified abnormal findings of blood chemistry: Secondary | ICD-10-CM

## 2021-11-01 LAB — COMPREHENSIVE METABOLIC PANEL
ALT: 6 U/L (ref 0–35)
AST: 15 U/L (ref 0–37)
Albumin: 4.3 g/dL (ref 3.5–5.2)
Alkaline Phosphatase: 89 U/L (ref 39–117)
BUN: 25 mg/dL — ABNORMAL HIGH (ref 6–23)
CO2: 32 mEq/L (ref 19–32)
Calcium: 9.8 mg/dL (ref 8.4–10.5)
Chloride: 101 mEq/L (ref 96–112)
Creatinine, Ser: 1.44 mg/dL — ABNORMAL HIGH (ref 0.40–1.20)
GFR: 31.33 mL/min — ABNORMAL LOW (ref >60.00)
Glucose, Bld: 98 mg/dL (ref 70–99)
Potassium: 4.9 mEq/L (ref 3.5–5.1)
Sodium: 139 mEq/L (ref 135–145)
Total Bilirubin: 0.4 mg/dL (ref 0.2–1.2)
Total Protein: 6.9 g/dL (ref 6.0–8.3)

## 2021-11-04 ENCOUNTER — Other Ambulatory Visit: Payer: Self-pay | Admitting: Family Medicine

## 2021-11-14 ENCOUNTER — Other Ambulatory Visit: Payer: Self-pay | Admitting: Family Medicine

## 2021-11-14 DIAGNOSIS — F419 Anxiety disorder, unspecified: Secondary | ICD-10-CM

## 2021-11-14 NOTE — Telephone Encounter (Signed)
Requesting:diazepam Contract: 10/18/21 UDS: 10/18/21 Last Visit: 10/12/21 Next Visit:  01/22/21 Last Refill: 07/19/21  Please Advise

## 2021-12-05 ENCOUNTER — Telehealth: Payer: Self-pay | Admitting: Family Medicine

## 2021-12-05 NOTE — Telephone Encounter (Signed)
Tried calling patient to schedule Medicare Annual Wellness Visit (AWV) either virtually or in office.  No answre    Last AWV 05/14/17; please schedule at anytime with health coach

## 2022-01-22 ENCOUNTER — Encounter: Payer: Self-pay | Admitting: Family Medicine

## 2022-01-22 ENCOUNTER — Ambulatory Visit (INDEPENDENT_AMBULATORY_CARE_PROVIDER_SITE_OTHER): Payer: Medicare Other | Admitting: Family Medicine

## 2022-01-22 VITALS — BP 130/72 | HR 88 | Temp 97.9°F | Resp 16 | Ht 66.0 in | Wt 132.4 lb

## 2022-01-22 DIAGNOSIS — N189 Chronic kidney disease, unspecified: Secondary | ICD-10-CM

## 2022-01-22 DIAGNOSIS — M79671 Pain in right foot: Secondary | ICD-10-CM | POA: Diagnosis not present

## 2022-01-22 DIAGNOSIS — G894 Chronic pain syndrome: Secondary | ICD-10-CM | POA: Diagnosis not present

## 2022-01-22 DIAGNOSIS — I10 Essential (primary) hypertension: Secondary | ICD-10-CM | POA: Diagnosis not present

## 2022-01-22 DIAGNOSIS — M79672 Pain in left foot: Secondary | ICD-10-CM | POA: Diagnosis not present

## 2022-01-22 DIAGNOSIS — E1142 Type 2 diabetes mellitus with diabetic polyneuropathy: Secondary | ICD-10-CM | POA: Diagnosis not present

## 2022-01-22 DIAGNOSIS — E538 Deficiency of other specified B group vitamins: Secondary | ICD-10-CM | POA: Diagnosis not present

## 2022-01-22 DIAGNOSIS — K5900 Constipation, unspecified: Secondary | ICD-10-CM | POA: Diagnosis not present

## 2022-01-22 DIAGNOSIS — F411 Generalized anxiety disorder: Secondary | ICD-10-CM

## 2022-01-22 DIAGNOSIS — R0989 Other specified symptoms and signs involving the circulatory and respiratory systems: Secondary | ICD-10-CM | POA: Diagnosis not present

## 2022-01-22 DIAGNOSIS — G6289 Other specified polyneuropathies: Secondary | ICD-10-CM

## 2022-01-22 DIAGNOSIS — L989 Disorder of the skin and subcutaneous tissue, unspecified: Secondary | ICD-10-CM | POA: Insufficient documentation

## 2022-01-22 MED ORDER — TIZANIDINE HCL 4 MG PO TABS: 4.0000 mg | ORAL_TABLET | Freq: Two times a day (BID) | ORAL | 1 refills | Status: DC | PRN

## 2022-01-22 NOTE — Assessment & Plan Note (Signed)
She feels that Venlafaxine XR 37.5 daily has been some helpful and the Tizanidine is also some helpful. Continue Neurontin 800 mg which is also helpful

## 2022-01-22 NOTE — Progress Notes (Signed)
Subjective:   By signing my name below, I, Dana Burns, attest that this documentation has been prepared under the direction and in the presence of Dana Lukes, MD. 01/22/2022     Patient ID: Dana Burns, female    DOB: 1926/10/14, 88 y.o.   MRN: [id number]  Chief Complaint  Patient presents with   Follow-up    HPI Patient is in today for an office visit and 12 week f/u. She is accompanied by her daughter.  She has a scab on her left ear that is very consistent and returns after she picks it off. She also has a lesion on her left leg that is discolored.  She reports that her feet hurt at several times during the day and they are often cold. She is also complaining of total body pain including hip pain and back pain. She is trying 37.5 mg venlafaxine XR but it is not helping to manage the pain. Uses tylenol before she goes to bed at night but wakes up with the pain worse in the morning. She cannot walk for long periods of time because the pain gets worse with walking and she feels like it will cause a spasm.  Her daughter reports her appetite is waxing and waning. She complains of 3-4 days without bowel movements and when it starts, they are often uncomfortable. Denies blood in stool.  Past Medical History:  Diagnosis Date   Abdominal pain 2017/10/23   Allergy    sneeze, rhinorrhea in am   Anxiety state 09/03/2013   Breast mass, right 10/14/2012   Chicken pox as a child   Chronic pain syndrome 01/23/2015   Chronic renal insufficiency 03/17/2011   Colon cancer ([place])    Complication of anesthesia    agitated when waking up, wakes up shaking, "like  I'm going into shock"   Constipation 10-23-17   Depression    husband died  3 months ago, pt. admits depression  currently    Diabetic neuropathy ([place]) 03/17/2011   DM (diabetes mellitus) type 2, uncontrolled, with ketoacidosis ([place]) 03/17/2011   Ductal hyperplasia of breast 03/17/2011   Essential hypertension, benign 05/31/2010    Qualifier: Diagnosis of  By: Percival Spanish, MD, Bethann Goo 07/15/2021   Fibromyalgia    GERD (gastroesophageal reflux disease)    H/O echocardiogram    done /w [place] in 2011, saw Dr. Percival Spanish for tachycardia    Headache    History of colon cancer 03/17/2011   Hyperlipidemia    6 years   Hyperlipidemia, mixed    6 years   Hypertension    15 years, reports she has a rapid heartrate    Loss of weight 12/14/2013   Measles as a child   Osteoarthritis (arthritis due to wear and tear of joints) 03/17/2011   Noted diffusely including neck   Osteopenia 12/14/2013   Pain in finger of right hand 05/15/2017   Pain in joint, lower leg 09/03/2013   Pain of right shoulder region 07/07/2014   Peripheral neuropathy    SOB (shortness of breath) 05/31/2010   Qualifier: Diagnosis of  By: Percival Spanish, MD, Farrel Gordon     Stress 06/01/2013   Tachycardia 05/31/2010   Qualifier: Diagnosis of  By: Percival Spanish, MD, Farrel Gordon     Type 2 diabetes mellitus with peripheral neuropathy ([place]) 03/17/2011   Vitamin B 12 deficiency 07/07/2014   intrinsic factor positive    Past Surgical History:  Procedure Laterality Date  BREAST EXCISIONAL BIOPSY Right 2013   benign   BREAST SURGERY  2013   right- benign   CATARACT EXTRACTION     /w IOL- bilateral    COLON SURGERY     For resection of cancer   Nodule removed     From throat    Family History  Problem Relation Age of Onset   Cancer Mother        Brain   Cancer Father        Colorectal   Cancer Sister        breast   Other Brother        pacemaker   Cancer Brother        pancreatic cancer   Arthritis Sister    Emphysema Brother    Cancer Brother    COPD Brother    Neuropathy Daughter    Fibromyalgia Daughter    Cancer Brother        metastatic colon cancer   Heart disease Neg Hx        Early    Social History   Socioeconomic History   Marital status: Widowed    Spouse name: Not on file   Number of children: 1   Years of education: 9    Highest education level: Not on file  Occupational History   Occupation: Retired  Tobacco Use   Smoking status: Never   Smokeless tobacco: Never  Substance and Sexual Activity   Alcohol use: No   Drug use: No   Sexual activity: Never    Comment: lives with daughter. no dietary restrictions  Other Topics Concern   Not on file  Social History Narrative   Patient lives at home with daughter.    Patient is retired.    Patient is widowed.    Patient has 1 child.    Patient has a 9th grade education.    Social Determinants of Health   Financial Resource Strain: Not on file  Food Insecurity: Not on file  Transportation Needs: Not on file  Physical Activity: Not on file  Stress: Not on file  Social Connections: Not on file  Intimate Partner Violence: Not on file    Outpatient Medications Prior to Visit  Medication Sig Dispense Refill   acetaminophen (TYLENOL) 500 MG tablet Take 500 mg by mouth every 6 (six) hours as needed for pain.     calcium carbonate (OS-CAL) 600 MG TABS Take 600 mg by mouth 2 (two) times daily with a meal.     Cholecalciferol (VITAMIN D3) 50 MCG (2000 UT) capsule Take 2,000 Units by mouth daily.      citalopram (CELEXA) 20 MG tablet TAKE 1 TABLET BY MOUTH EVERY DAY 90 tablet 2   Cyanocobalamin (B-12 IJ) Place 1,000 mcg under the tongue daily.     diazepam (VALIUM) 5 MG tablet TAKE 1/2 TO 1 TABLET DAILY AS NEEDED FOR ANXIETY 30 tablet 1   gabapentin (NEURONTIN) 800 MG tablet TAKE 1 TABLET BY MOUTH THREE TIMES A DAY 270 tablet 1   pantoprazole (PROTONIX) 40 MG tablet TAKE 1 TABLET BY MOUTH EVERY DAY 90 tablet 1   venlafaxine XR (EFFEXOR-XR) 37.5 MG 24 hr capsule TAKE 1 CAPSULE BY MOUTH DAILY WITH BREAKFAST. 90 capsule 2   tiZANidine (ZANAFLEX) 4 MG tablet Take 1 tablet (4 mg total) by mouth 2 (two) times daily as needed for muscle spasms. 180 tablet 1   metFORMIN (GLUCOPHAGE) 500 MG tablet TAKE 1 TABLET BY MOUTH EVERY DAY  WITH BREAKFAST 90 tablet 1   No  facility-administered medications prior to visit.    Allergies  Allergen Reactions   Tape Itching and Rash   Actos [Pioglitazone Hydrochloride] Other (See Comments)    Unknown   Buspar [Buspirone Hcl] Other (See Comments)    Unknown   Lipitor [Atorvastatin Calcium] Other (See Comments)    "It made me hurt all over."   Penicillins Rash    Review of Systems  Constitutional:  Negative for fever and malaise/fatigue.  HENT:  Negative for congestion.   Eyes:  Negative for redness.  Respiratory:  Negative for shortness of breath.   Cardiovascular:  Negative for chest pain, palpitations and leg swelling.  Gastrointestinal:  Positive for constipation. Negative for abdominal pain, blood in stool and nausea.  Genitourinary:  Negative for dysuria and frequency.  Musculoskeletal:  Positive for back pain and joint pain (hip). Negative for falls.       (+) Feet pain  Skin:  Negative for rash.  Neurological:  Negative for dizziness, loss of consciousness and headaches.  Endo/Heme/Allergies:  Negative for polydipsia.  Psychiatric/Behavioral:  Negative for depression. The patient is not nervous/anxious.       Objective:    Physical Exam Constitutional:      General: She is not in acute distress.    Appearance: She is well-developed.  HENT:     Head: Normocephalic and atraumatic.  Eyes:     Conjunctiva/sclera: Conjunctivae normal.  Neck:     Thyroid: No thyromegaly.  Cardiovascular:     Rate and Rhythm: Normal rate and regular rhythm.     Heart sounds: Normal heart sounds. No murmur heard. Pulmonary:     Effort: Pulmonary effort is normal. No respiratory distress.     Breath sounds: Normal breath sounds.  Abdominal:     General: Bowel sounds are normal. There is no distension.     Palpations: Abdomen is soft. There is no mass.     Tenderness: There is no abdominal tenderness.     Comments: Tender on palpation  Musculoskeletal:     Cervical back: Neck supple.  Feet:     Comments:  Thin purple skin on the bottom of feet, cold to the touch Lymphadenopathy:     Cervical: No cervical adenopathy.  Skin:    General: Skin is warm and dry.     Comments: Scab on edge of pinna Sores on left leg, one scabbed, other macular and raised  Neurological:     Mental Status: She is alert and oriented to person, place, and time.  Psychiatric:        Behavior: Behavior normal.    BP 130/72    Pulse 88    Temp 97.9 F (36.6 C)    Resp 16    Ht 5\' 6"  (1.676 m)    Wt 132 lb 6.4 oz (60.1 kg)    SpO2 95%    BMI 21.37 kg/m  Wt Readings from Last 3 Encounters:  01/22/22 132 lb 6.4 oz (60.1 kg)  10/12/21 132 lb 9.6 oz (60.1 kg)  07/26/21 139 lb (63 kg)    Diabetic Foot Exam - Simple   No data filed    Lab Results  Component Value Date   WBC 4.8 10/18/2021   HGB 12.6 10/18/2021   HCT 39.2 10/18/2021   PLT 230.0 10/18/2021   GLUCOSE 98 11/01/2021   CHOL 237 (H) 10/18/2021   TRIG 191.0 (H) 10/18/2021   HDL 44.80 10/18/2021   LDLDIRECT  120.0 04/12/2020   LDLCALC 154 (H) 10/18/2021   ALT 6 11/01/2021   AST 15 11/01/2021   NA 139 11/01/2021   K 4.9 11/01/2021   CL 101 11/01/2021   CREATININE 1.44 (H) 11/01/2021   BUN 25 (H) 11/01/2021   CO2 32 11/01/2021   TSH 2.32 10/18/2021   HGBA1C 6.3 10/18/2021   MICROALBUR 1.0 03/28/2021    Lab Results  Component Value Date   TSH 2.32 10/18/2021   Lab Results  Component Value Date   WBC 4.8 10/18/2021   HGB 12.6 10/18/2021   HCT 39.2 10/18/2021   MCV 85.9 10/18/2021   PLT 230.0 10/18/2021   Lab Results  Component Value Date   NA 139 11/01/2021   K 4.9 11/01/2021   CO2 32 11/01/2021   GLUCOSE 98 11/01/2021   BUN 25 (H) 11/01/2021   CREATININE 1.44 (H) 11/01/2021   BILITOT 0.4 11/01/2021   ALKPHOS 89 11/01/2021   AST 15 11/01/2021   ALT 6 11/01/2021   PROT 6.9 11/01/2021   ALBUMIN 4.3 11/01/2021   CALCIUM 9.8 11/01/2021   GFR 31.33 (L) 11/01/2021   Lab Results  Component Value Date   CHOL 237 (H) 10/18/2021    Lab Results  Component Value Date   HDL 44.80 10/18/2021   Lab Results  Component Value Date   LDLCALC 154 (H) 10/18/2021   Lab Results  Component Value Date   TRIG 191.0 (H) 10/18/2021   Lab Results  Component Value Date   CHOLHDL 5 10/18/2021   Lab Results  Component Value Date   HGBA1C 6.3 10/18/2021       Assessment & Plan:   Problem List Items Addressed This Visit     Essential hypertension, benign    Well controlled, no changes to meds. Encouraged heart healthy diet such as the DASH diet and exercise as tolerated.       Type 2 diabetes mellitus with peripheral neuropathy (HCC)    hgba1c acceptable, minimize simple carbs. Increase exercise as tolerated. Continue current meds      Relevant Medications   tiZANidine (ZANAFLEX) 4 MG tablet   Chronic renal insufficiency    Hydrate and monitor      Anxiety state    Is doing well on current meds. No changes today      Peripheral neuropathy    She feels that Venlafaxine XR 37.5 daily has been some helpful and the Tizanidine is also some helpful. Continue Neurontin 800 mg which is also helpful      Relevant Medications   tiZANidine (ZANAFLEX) 4 MG tablet   Vitamin B 12 deficiency    Supplement and monitor      Chronic pain syndrome    Continues to struggle with chronic pain a combination of peripheral neuropathy, arthritis and now she is describing some claudication. Noting when she walks short distance pain develops in both feet and lower legs and she has to sit down. Referred to vascular surgery for evaluation.       Relevant Medications   tiZANidine (ZANAFLEX) 4 MG tablet   Constipation    Encouraged increased hydration and fiber in diet. Daily probiotics. If bowels not moving can use MOM 2 tbls po in 4 oz of warm prune juice by mouth every 2-3 days. If no results then repeat in 4 hours with  Dulcolax suppository pr, may repeat again in 4 more hours as needed. Seek care if symptoms worsen. Consider  daily Miralax and/or Dulcolax if symptoms persist.  Start Miralax and Benefiber daily and can increase.       Foot pain, bilateral - Primary   Relevant Orders   Ambulatory referral to Vascular Surgery   Skin lesion    Left ear and left leg referred to dermatology for further evaluation.       Relevant Orders   Ambulatory referral to Dermatology   Diminished pulses in lower extremity    Referred to vascular surgery for evaluation      Relevant Orders   Ambulatory referral to Vascular Surgery     Meds ordered this encounter  Medications   tiZANidine (ZANAFLEX) 4 MG tablet    Sig: Take 1 tablet (4 mg total) by mouth 2 (two) times daily as needed for muscle spasms.    Dispense:  180 tablet    Refill:  1    I,Dana Burns,acting as a scribe for Dana Homans, MD.,have documented all relevant documentation on the behalf of Dana Homans, MD,as directed by  Dana Homans, MD while in the presence of Dana Homans, MD.   I, Dana Lukes, MD. , personally preformed the services described in this documentation.  All medical record entries made by the scribe were at my direction and in my presence.  I have reviewed the chart and discharge instructions (if applicable) and agree that the record reflects my personal performance and is accurate and complete. 01/22/2022

## 2022-01-22 NOTE — Assessment & Plan Note (Signed)
Referred to vascular surgery for evaluation

## 2022-01-22 NOTE — Assessment & Plan Note (Signed)
Is doing well on current meds. No changes today

## 2022-01-22 NOTE — Assessment & Plan Note (Signed)
Well controlled, no changes to meds. Encouraged heart healthy diet such as the DASH diet and exercise as tolerated.  °

## 2022-01-22 NOTE — Patient Instructions (Signed)
Encouraged increased hydration and fiber in diet. Daily probiotics. If bowels not moving can use MOM 2 tbls po in 4 oz of warm prune juice by mouth every 2-3 days. If no results then repeat in 4 hours with  Dulcolax suppository pr, may repeat again in 4 more hours as needed. Seek care if symptoms worsen. Consider daily Miralax and/or Dulcolax if symptoms persist.    Daily Miralax and Benefiber mixed together even twice daily   Constipation, Adult Constipation is when a person has fewer than three bowel movements in a week, has difficulty having a bowel movement, or has stools (feces) that are dry, hard, or larger than normal. Constipation may be caused by an underlying condition. It may become worse with age if a person takes certain medicines and does not take in enough fluids. Follow these instructions at home: Eating and drinking  Eat foods that have a lot of fiber, such as beans, whole grains, and fresh fruits and vegetables. Limit foods that are low in fiber and high in fat and processed sugars, such as fried or sweet foods. These include french fries, hamburgers, cookies, candies, and soda. Drink enough fluid to keep your urine pale yellow. General instructions Exercise regularly or as told by your health care provider. Try to do 150 minutes of moderate exercise each week. Use the bathroom when you have the urge to go. Do not hold it in. Take over-the-counter and prescription medicines only as told by your health care provider. This includes any fiber supplements. During bowel movements: Practice deep breathing while relaxing the lower abdomen. Practice pelvic floor relaxation. Watch your condition for any changes. Let your health care provider know about them. Keep all follow-up visits as told by your health care provider. This is important. Contact a health care provider if: You have pain that gets worse. You have a fever. You do not have a bowel movement after 4 days. You  vomit. You are not hungry or you lose weight. You are bleeding from the opening between the buttocks (anus). You have thin, pencil-like stools. Get help right away if: You have a fever and your symptoms suddenly get worse. You leak stool or have blood in your stool. Your abdomen is bloated. You have severe pain in your abdomen. You feel dizzy or you faint. Summary Constipation is when a person has fewer than three bowel movements in a week, has difficulty having a bowel movement, or has stools (feces) that are dry, hard, or larger than normal. Eat foods that have a lot of fiber, such as beans, whole grains, and fresh fruits and vegetables. Drink enough fluid to keep your urine pale yellow. Take over-the-counter and prescription medicines only as told by your health care provider. This includes any fiber supplements. This information is not intended to replace advice given to you by your health care provider. Make sure you discuss any questions you have with your health care provider. Document Revised: 11/04/2019 Document Reviewed: 11/04/2019 Elsevier Patient Education  [place].

## 2022-01-22 NOTE — Assessment & Plan Note (Signed)
Supplement and monitor 

## 2022-01-22 NOTE — Assessment & Plan Note (Signed)
Encouraged increased hydration and fiber in diet. Daily probiotics. If bowels not moving can use MOM 2 tbls po in 4 oz of warm prune juice by mouth every 2-3 days. If no results then repeat in 4 hours with  Dulcolax suppository pr, may repeat again in 4 more hours as needed. Seek care if symptoms worsen. Consider daily Miralax and/or Dulcolax if symptoms persist. Start Miralax and Benefiber daily and can increase.

## 2022-01-22 NOTE — Assessment & Plan Note (Signed)
Hydrate and monitor 

## 2022-01-22 NOTE — Assessment & Plan Note (Signed)
Left ear and left leg referred to dermatology for further evaluation.

## 2022-01-22 NOTE — Assessment & Plan Note (Signed)
Continues to struggle with chronic pain a combination of peripheral neuropathy, arthritis and now she is describing some claudication. Noting when she walks short distance pain develops in both feet and lower legs and she has to sit down. Referred to vascular surgery for evaluation.

## 2022-01-22 NOTE — Assessment & Plan Note (Signed)
hgba1c acceptable, minimize simple carbs. Increase exercise as tolerated. Continue current meds 

## 2022-02-23 ENCOUNTER — Telehealth: Payer: Self-pay | Admitting: Family Medicine

## 2022-02-23 NOTE — Telephone Encounter (Signed)
Left message for patient to call back and schedule Medicare Annual Wellness Visit (AWV) in office.   If not able to come in office, please offer to do virtually or by telephone.  Left office number and my jabber [phone].  Last AWV:05/14/2017  Please schedule at anytime with Nurse Health Advisor.

## 2022-02-24 ENCOUNTER — Other Ambulatory Visit: Payer: Self-pay

## 2022-02-24 DIAGNOSIS — R0989 Other specified symptoms and signs involving the circulatory and respiratory systems: Secondary | ICD-10-CM

## 2022-02-26 ENCOUNTER — Encounter: Payer: Self-pay | Admitting: Surgery

## 2022-02-26 ENCOUNTER — Ambulatory Visit: Payer: Medicare Other | Admitting: Surgery

## 2022-02-26 ENCOUNTER — Ambulatory Visit (HOSPITAL_COMMUNITY)
Admission: RE | Admit: 2022-02-26 | Discharge: 2022-02-26 | Disposition: A | Discharge status: Home / Self Care | Payer: Medicare Other | Source: Ambulatory Visit | Attending: Surgery | Admitting: Surgery

## 2022-02-26 ENCOUNTER — Other Ambulatory Visit: Payer: Self-pay

## 2022-02-26 VITALS — BP 144/83 | HR 79 | Temp 98.1°F | Resp 18 | Ht 66.0 in | Wt 134.0 lb

## 2022-02-26 DIAGNOSIS — M25562 Pain in left knee: Secondary | ICD-10-CM | POA: Diagnosis not present

## 2022-02-26 DIAGNOSIS — R0989 Other specified symptoms and signs involving the circulatory and respiratory systems: Secondary | ICD-10-CM | POA: Diagnosis not present

## 2022-02-26 DIAGNOSIS — M25561 Pain in right knee: Secondary | ICD-10-CM

## 2022-02-26 DIAGNOSIS — M479 Spondylosis, unspecified: Secondary | ICD-10-CM | POA: Diagnosis not present

## 2022-02-26 NOTE — Progress Notes (Signed)
[facility] [place]  Patient name: Dana Burns MRN: [id number] DOB: [birth year] Sex: female   REQUESTING PROVIDER:    Dr. Charlett Blake   REASON FOR CONSULT:    Decreased pulses  HISTORY OF PRESENT ILLNESS:   Dana Burns is a 88 y.o. female, who is referred for evaluation of leg pain.  She states that this is been going on for a while.  It is worse when she stands up.  It can be debilitating.  She states that the pain goes from her hip down to her toes.  She is also concerned that her feet are very cold.  She has not a history of neuropathy.  She has also had a history of osteoarthritis in her back with a pinched nerve.  She underwent an injection which did not give her relief.  Additional injections were delayed due to a cardiac work-up.  PAST MEDICAL HISTORY    Past Medical History:  Diagnosis Date   Abdominal pain October 02, 2017   Allergy    sneeze, rhinorrhea in am   Anxiety state 09/03/2013   Breast mass, right 10/14/2012   Chicken pox as a child   Chronic pain syndrome 01/23/2015   Chronic renal insufficiency 03/17/2011   Colon cancer ([place])    Complication of anesthesia    agitated when waking up, wakes up shaking, "like  I'm going into shock"   Constipation 10-02-2017   Depression    husband died  3 months ago, pt. admits depression  currently    Diabetic neuropathy ([place]) 03/17/2011   DM (diabetes mellitus) type 2, uncontrolled, with ketoacidosis ([place]) 03/17/2011   Ductal hyperplasia of breast 03/17/2011   Essential hypertension, benign 05/31/2010   Qualifier: Diagnosis of  By: Percival Spanish, MD, Bethann Goo 07/15/2021   Fibromyalgia    GERD (gastroesophageal reflux disease)    H/O echocardiogram    done /w [place] in 2011, saw Dr. Percival Spanish for tachycardia    Headache    History of colon cancer 03/17/2011   Hyperlipidemia    6 years   Hyperlipidemia, mixed    6 years   Hypertension    15 years, reports she has a  rapid heartrate    Loss of weight 12/14/2013   Measles as a child   Osteoarthritis (arthritis due to wear and tear of joints) 03/17/2011   Noted diffusely including neck   Osteopenia 12/14/2013   Pain in finger of right hand 05/15/2017   Pain in joint, lower leg 09/03/2013   Pain of right shoulder region 07/07/2014   Peripheral neuropathy    SOB (shortness of breath) 05/31/2010   Qualifier: Diagnosis of  By: Percival Spanish, MD, Farrel Gordon     Stress 06/01/2013   Tachycardia 05/31/2010   Qualifier: Diagnosis of  By: Percival Spanish, MD, Farrel Gordon     Type 2 diabetes mellitus with peripheral neuropathy ([place]) 03/17/2011   Vitamin B 12 deficiency 07/07/2014   intrinsic factor positive     FAMILY HISTORY   Family History  Problem Relation Age of Onset   Cancer Mother        Brain   Cancer Father        Colorectal   Cancer Sister        breast   Other Brother        pacemaker   Cancer Brother        pancreatic cancer   Arthritis Sister    Emphysema Brother  Cancer Brother    COPD Brother    Neuropathy Daughter    Fibromyalgia Daughter    Cancer Brother        metastatic colon cancer   Heart disease Neg Hx        Early    SOCIAL HISTORY:   Social History   Socioeconomic History   Marital status: Widowed    Spouse name: Not on file   Number of children: 1   Years of education: 9   Highest education level: Not on file  Occupational History   Occupation: Retired  Tobacco Use   Smoking status: Never   Smokeless tobacco: Never  Substance and Sexual Activity   Alcohol use: No   Drug use: No   Sexual activity: Never    Comment: lives with daughter. no dietary restrictions  Other Topics Concern   Not on file  Social History Narrative   Patient lives at home with daughter.    Patient is retired.    Patient is widowed.    Patient has 1 child.    Patient has a 9th grade education.    Social Determinants of Health   Financial Resource Strain: Not on file  Food Insecurity: Not  on file  Transportation Needs: Not on file  Physical Activity: Not on file  Stress: Not on file  Social Connections: Not on file  Intimate Partner Violence: Not on file    ALLERGIES:    Allergies  Allergen Reactions   Tape Itching and Rash   Actos [Pioglitazone Hydrochloride] Other (See Comments)    Unknown   Buspar [Buspirone Hcl] Other (See Comments)    Unknown   Lipitor [Atorvastatin Calcium] Other (See Comments)    "It made me hurt all over."   Penicillins Rash    CURRENT MEDICATIONS:    Current Outpatient Medications  Medication Sig Dispense Refill   acetaminophen (TYLENOL) 500 MG tablet Take 500 mg by mouth every 6 (six) hours as needed for pain.     calcium carbonate (OS-CAL) 600 MG TABS Take 600 mg by mouth 2 (two) times daily with a meal.     Cholecalciferol (VITAMIN D3) 50 MCG (2000 UT) capsule Take 2,000 Units by mouth daily.      citalopram (CELEXA) 20 MG tablet TAKE 1 TABLET BY MOUTH EVERY DAY 90 tablet 2   Cyanocobalamin (B-12 IJ) Place 1,000 mcg under the tongue daily.     diazepam (VALIUM) 5 MG tablet TAKE 1/2 TO 1 TABLET DAILY AS NEEDED FOR ANXIETY 30 tablet 1   gabapentin (NEURONTIN) 800 MG tablet TAKE 1 TABLET BY MOUTH THREE TIMES A DAY 270 tablet 1   pantoprazole (PROTONIX) 40 MG tablet TAKE 1 TABLET BY MOUTH EVERY DAY 90 tablet 1   tiZANidine (ZANAFLEX) 4 MG tablet Take 1 tablet (4 mg total) by mouth 2 (two) times daily as needed for muscle spasms. 180 tablet 1   venlafaxine XR (EFFEXOR-XR) 37.5 MG 24 hr capsule TAKE 1 CAPSULE BY MOUTH DAILY WITH BREAKFAST. 90 capsule 2   No current facility-administered medications for this visit.    REVIEW OF SYSTEMS:   [X]  denotes positive finding, [ ]  denotes negative finding Cardiac  Comments:  Chest pain or chest pressure:    Shortness of breath upon exertion:    Short of breath when lying flat:    Irregular heart rhythm:        Vascular    Pain in calf, thigh, or hip brought on by ambulation:  Pain in  feet at night that wakes you up from your sleep:     Blood clot in your veins:    Leg swelling:         Pulmonary    Oxygen at home:    Productive cough:     Wheezing:         Neurologic    Sudden weakness in arms or legs:     Sudden numbness in arms or legs:     Sudden onset of difficulty speaking or slurred speech:    Temporary loss of vision in one eye:     Problems with dizziness:         Gastrointestinal    Blood in stool:      Vomited blood:         Genitourinary    Burning when urinating:     Blood in urine:        Psychiatric    Major depression:         Hematologic    Bleeding problems:    Problems with blood clotting too easily:        Skin    Rashes or ulcers:        Constitutional    Fever or chills:     PHYSICAL EXAM:   Vitals:   02/26/22 1435  BP: (!) 144/83  Pulse: 79  Resp: 18  Temp: 98.1 F (36.7 C)  SpO2: 96%  Weight: 134 lb (60.8 kg)  Height: 5\' 6"  (1.676 m)    GENERAL: The patient is a well-nourished female, in no acute distress. The vital signs are documented above. CARDIAC: There is a regular rate and rhythm.  VASCULAR: Palpable pedal pulses PULMONARY: Nonlabored respirations  MUSCULOSKELETAL: There are no major deformities or cyanosis. NEUROLOGIC: No focal weakness or paresthesias are detected. SKIN: There are no ulcers or rashes noted. PSYCHIATRIC: The patient has a normal affect.  STUDIES:   I have reviewed the following studies:  ABI/TBI Today's ABI Today's TBI Previous ABI Previous TBI   +-------+-----------+-----------+------------+------------+   Right   1.28        0.78        1.05         0.85           +-------+-----------+-----------+------------+------------+   Left    1.19        0.81        1.05         0.82           +-------+-----------+-----------+------------+------------+      Left toe pressure = 122 Right toe pressure = 117 All waveforms are triphasic  ASSESSMENT and PLAN   Leg pain: Her ABI  studies are normal with normal toe pressures and waveforms.  In addition, she has palpable pedal pulses.  Therefore, I do not feel that her symptoms are related to arterial insufficiency.  She describes the pain from her hip to her toes which would be consistent with sciatic nerve pain.  In addition she has a known pinched nerve in her back and has previously undergone injections.  At this point, I would focus my efforts on further evaluating her sciatic nerve and treatment of this to help alleviate her symptoms.   Leia Alf, MD, FACS [facility] [phone] Pager [phone]

## 2022-02-27 NOTE — Progress Notes (Signed)
Subjective:   Dana Burns is a 88 y.o. female who presents for Medicare Annual (Subsequent) preventive examination.  I connected with Donte today by telephone and verified that I am speaking with the correct person using two identifiers. Location patient: home Location provider: work Persons participating in the virtual visit: patient, Marine scientist.    I discussed the limitations, risks, security and privacy concerns of performing an evaluation and management service by telephone and the availability of in person appointments. I also discussed with the patient that there may be a patient responsible charge related to this service. The patient expressed understanding and verbally consented to this telephonic visit.    Interactive audio and video telecommunications were attempted between this provider and patient, however failed, due to patient having technical difficulties OR patient did not have access to video capability.  We continued and completed visit with audio only.  Some vital signs may be absent or patient reported.   Time Spent with patient on telephone encounter: 25 minutes   Review of Systems     Cardiac Risk Factors include: advanced age (>5men, >43 women);diabetes mellitus;hypertension;sedentary lifestyle     Objective:    Today's Vitals   03/01/22 1502  Weight: 134 lb (60.8 kg)  Height: 5\' 6"  (1.676 m)   Body mass index is 21.63 kg/m.  Advanced Directives 03/01/2022 05/14/2017 09/22/2015 10/16/2012  Does Patient Have a Medical Advance Directive? No No No Patient does not have advance directive  Would patient like information on creating a medical advance directive? No - Patient declined No - Patient declined No - patient declined information -    Current Medications (verified) Outpatient Encounter Medications as of 03/01/2022  Medication Sig   acetaminophen (TYLENOL) 500 MG tablet Take 500 mg by mouth every 6 (six) hours as needed for pain.   calcium carbonate  (OS-CAL) 600 MG TABS Take 600 mg by mouth 2 (two) times daily with a meal.   Cholecalciferol (VITAMIN D3) 50 MCG (2000 UT) capsule Take 2,000 Units by mouth daily.    citalopram (CELEXA) 20 MG tablet TAKE 1 TABLET BY MOUTH EVERY DAY   Cyanocobalamin (B-12 IJ) Place 1,000 mcg under the tongue daily.   diazepam (VALIUM) 5 MG tablet TAKE 1/2 TO 1 TABLET DAILY AS NEEDED FOR ANXIETY   gabapentin (NEURONTIN) 800 MG tablet TAKE 1 TABLET BY MOUTH THREE TIMES A DAY   pantoprazole (PROTONIX) 40 MG tablet TAKE 1 TABLET BY MOUTH EVERY DAY   tiZANidine (ZANAFLEX) 4 MG tablet Take 1 tablet (4 mg total) by mouth 2 (two) times daily as needed for muscle spasms.   venlafaxine XR (EFFEXOR-XR) 37.5 MG 24 hr capsule TAKE 1 CAPSULE BY MOUTH DAILY WITH BREAKFAST.   No facility-administered encounter medications on file as of 03/01/2022.    Allergies (verified) Tape, Actos [pioglitazone hydrochloride], Buspar [buspirone hcl], Lipitor [atorvastatin calcium], and Penicillins   History: Past Medical History:  Diagnosis Date   Abdominal pain Oct 07, 2017   Allergy    sneeze, rhinorrhea in am   Anxiety state 09/03/2013   Breast mass, right 10/14/2012   Chicken pox as a child   Chronic pain syndrome 01/23/2015   Chronic renal insufficiency 03/17/2011   Colon cancer ([facility])    Complication of anesthesia    agitated when waking up, wakes up shaking, "like  I'm going into shock"   Constipation 2017/10/07   Depression    husband died  3 months ago, pt. admits depression  currently    Diabetic neuropathy (  [place]) 03/17/2011   DM (diabetes mellitus) type 2, uncontrolled, with ketoacidosis ([place]) 03/17/2011   Ductal hyperplasia of breast 03/17/2011   Essential hypertension, benign 05/31/2010   Qualifier: Diagnosis of  By: Percival Spanish, MD, Bethann Goo 07/15/2021   Fibromyalgia    GERD (gastroesophageal reflux disease)    H/O echocardiogram    done /w [place] in 2011, saw Dr. Percival Spanish for tachycardia    Headache    History  of colon cancer 03/17/2011   Hyperlipidemia    6 years   Hyperlipidemia, mixed    6 years   Hypertension    15 years, reports she has a rapid heartrate    Loss of weight 12/14/2013   Measles as a child   Osteoarthritis (arthritis due to wear and tear of joints) 03/17/2011   Noted diffusely including neck   Osteopenia 12/14/2013   Pain in finger of right hand 05/15/2017   Pain in joint, lower leg 09/03/2013   Pain of right shoulder region 07/07/2014   Peripheral neuropathy    SOB (shortness of breath) 05/31/2010   Qualifier: Diagnosis of  By: Percival Spanish, MD, Farrel Gordon     Stress 06/01/2013   Tachycardia 05/31/2010   Qualifier: Diagnosis of  By: Percival Spanish, MD, Farrel Gordon     Type 2 diabetes mellitus with peripheral neuropathy ([place]) 03/17/2011   Vitamin B 12 deficiency 07/07/2014   intrinsic factor positive   Past Surgical History:  Procedure Laterality Date   BREAST EXCISIONAL BIOPSY Right 2013   benign   BREAST SURGERY  2013   right- benign   CATARACT EXTRACTION     /w IOL- bilateral    COLON SURGERY     For resection of cancer   Nodule removed     From throat   Family History  Problem Relation Age of Onset   Cancer Mother        Brain   Cancer Father        Colorectal   Cancer Sister        breast   Other Brother        pacemaker   Cancer Brother        pancreatic cancer   Arthritis Sister    Emphysema Brother    Cancer Brother    COPD Brother    Neuropathy Daughter    Fibromyalgia Daughter    Cancer Brother        metastatic colon cancer   Heart disease Neg Hx        Early   Social History   Socioeconomic History   Marital status: Widowed    Spouse name: Not on file   Number of children: 1   Years of education: 9   Highest education level: Not on file  Occupational History   Occupation: Retired  Tobacco Use   Smoking status: Never   Smokeless tobacco: Never  Substance and Sexual Activity   Alcohol use: No   Drug use: No   Sexual activity: Never     Comment: lives with daughter. no dietary restrictions  Other Topics Concern   Not on file  Social History Narrative   Patient lives at home with daughter.    Patient is retired.    Patient is widowed.    Patient has 1 child.    Patient has a 9th grade education.    Social Determinants of Health   Financial Resource Strain: Low Risk    Difficulty of Paying Living Expenses:  Not hard at all  Food Insecurity: No Food Insecurity   Worried About Charity fundraiser in the Last Year: Never true   Ran Out of Food in the Last Year: Never true  Transportation Needs: No Transportation Needs   Lack of Transportation (Medical): No   Lack of Transportation (Non-Medical): No  Physical Activity: Inactive   Days of Exercise per Week: 0 days   Minutes of Exercise per Session: 0 min  Stress: No Stress Concern Present   Feeling of Stress : Not at all  Social Connections: Not on file    Tobacco Counseling Counseling given: Not Answered   Clinical Intake:  Pre-visit preparation completed: Yes  Pain : 0-10 Pain Type: Chronic pain Pain Location: Back (legs,hip,feet) Pain Onset: More than a month ago Pain Frequency: Constant     Nutritional Risks: None Diabetes: Yes CBG done?: No Did pt. bring in CBG monitor from home?: No (phone visit)  How often do you need to have someone help you when you read instructions, pamphlets, or other written materials from your doctor or pharmacy?: 1 - Never  Diabetes:  Is the patient diabetic?  Yes  If diabetic, was a CBG obtained today?  No  Did the patient bring in their glucometer from home?  No phone visit How often do you monitor your CBG's? never.   Financial Strains and Diabetes Management:  Are you having any financial strains with the device, your supplies or your medication? No .  Does the patient want to be seen by Chronic Care Management for management of their diabetes?  No  Would the patient like to be referred to a Nutritionist or  for Diabetic Management?  No   Diabetic Exams:  Diabetic Eye Exam: . Overdue for diabetic eye exam. Pt has been advised about the importance in completing this exam.   Diabetic Foot Exam: Pt has been advised about the importance in completing this exam. To be completed by PCP.   Interpreter Needed?: No  Information entered by :: Caroleen Hamman LPN   Activities of Daily Living In your present state of health, do you have any difficulty performing the following activities: 03/01/2022 03/28/2021  Hearing? N Y  Vision? N N  Difficulty concentrating or making decisions? Y N  Comment occasionally -  Walking or climbing stairs? Y Y  Comment stairs -  Dressing or bathing? N N  Doing errands, shopping? Y N  Preparing Food and eating ? N -  Using the Toilet? N -  In the past six months, have you accidently leaked urine? N -  Do you have problems with loss of bowel control? N -  Managing your Medications? N -  Managing your Finances? N -  Housekeeping or managing your Housekeeping? Y -  Some recent data might be hidden    Patient Care Team: Mosie Lukes, MD as PCP - General (Family Medicine) Marica Otter, Brent as Consulting Physician (Optometry) Garlan Fair, MD as Consulting Physician (Gastroenterology) Doreatha Martin, MD as Referring Physician (Physical Medicine and Rehabilitation)  Indicate any recent Medical Services you may have received from other than [facility] providers in the past year (date may be approximate).     Assessment:   This is a routine wellness examination for [place].  Hearing/Vision screen Hearing Screening - Comments:: C/o mild hearing loss Vision Screening - Comments:: Last eye exam-several years ago  Dietary issues and exercise activities discussed: Current Exercise Habits: The patient does not participate in regular exercise  at present, Exercise limited by: None identified   Goals Addressed             This Visit's Progress    Patient  Stated       Drink more water       Depression Screen PHQ 2/9 Scores 03/01/2022 10/12/2021 07/26/2020 05/14/2017 02/11/2017 09/22/2015 04/19/2015  PHQ - 2 Score 1 0 5 0 0 1 0  PHQ- 9 Score - - 17 - - - -    Fall Risk Fall Risk  03/01/2022 10/12/2021 08/22/2020 05/14/2017 02/11/2017  Falls in the past year? 0 1 0 No Yes  Number falls in past yr: 0 1 0 - 1  Injury with Fall? 0 1 0 - Yes  Risk for fall due to : - History of fall(s) - - -  Risk for fall due to: Comment - - - - -  Follow up Falls prevention discussed Falls evaluation completed - - -    FALL RISK PREVENTION PERTAINING TO THE HOME:  Any stairs in or around the home? No  Home free of loose throw rugs in walkways, pet beds, electrical cords, etc? Yes  Adequate lighting in your home to reduce risk of falls? Yes   ASSISTIVE DEVICES UTILIZED TO PREVENT FALLS:  Life alert? No  Use of a cane, walker or w/c? Yes Cane Grab bars in the bathroom? No  Shower chair or bench in shower? Yes  Elevated toilet seat or a handicapped toilet? No   TIMED UP AND GO:  Was the test performed? No . Phone visit   Cognitive Function:Normal cognitive status assessed by this Nurse Health Advisor. No abnormalities found.   MMSE - Mini Mental State Exam 05/14/2017 09/22/2015  Orientation to time 5 5  Orientation to Place 5 5  Registration 3 3  Attention/ Calculation 5 5  Recall 2 3  Language- name 2 objects 2 2  Language- repeat 1 1  Language- follow 3 step command 3 3  Language- read & follow direction 1 1  Write a sentence 1 1  Copy design 1 1  Total score 29 30        Immunizations Immunization History  Administered Date(s) Administered   Fluad Quad(high Dose 65+) 10/12/2021   Influenza Whole 09/30/2010   Influenza, High Dose Seasonal PF 09/24/2017, 10/14/2018, 09/14/2019   Influenza,inj,Quad PF,6+ Mos 08/24/2014, 11/13/2016   Influenza-Unspecified 01/04/2014, 10/13/2015, 09/10/2020   PFIZER(Purple Top)SARS-COV-2 Vaccination  04/21/2020, 05/12/2020   Pneumococcal Conjugate-13 09/14/2014   Pneumococcal Polysaccharide-23 10/27/2015   Td 01/01/2004   Tdap 10/17/2011    TDAP status: Due, Education has been provided regarding the importance of this vaccine. Advised may receive this vaccine at local pharmacy or Health Dept. Aware to provide a copy of the vaccination record if obtained from local pharmacy or Health Dept. Verbalized acceptance and understanding.  Flu Vaccine status: Up to date  Pneumococcal vaccine status: Up to date  Covid-19 vaccine status: Information provided on how to obtain vaccines.   Qualifies for Shingles Vaccine? Yes   Zostavax completed No   Shingrix Completed?: No.    Education has been provided regarding the importance of this vaccine. Patient has been advised to call insurance company to determine out of pocket expense if they have not yet received this vaccine. Advised may also receive vaccine at local pharmacy or Health Dept. Verbalized acceptance and understanding.  Screening Tests Health Maintenance  Topic Date Due   Zoster Vaccines- Shingrix (1 of 2) Never done  FOOT EXAM  02/11/2018   OPHTHALMOLOGY EXAM  04/03/2018   COVID-19 Vaccine (3 - Pfizer risk series) 06/09/2020   TETANUS/TDAP  10/16/2021   URINE MICROALBUMIN  03/28/2022   HEMOGLOBIN A1C  04/18/2022   Pneumonia Vaccine 40+ Years old  Completed   INFLUENZA VACCINE  Completed   DEXA SCAN  Completed   HPV VACCINES  Aged Out    Health Maintenance  Health Maintenance Due  Topic Date Due   Zoster Vaccines- Shingrix (1 of 2) Never done   FOOT EXAM  02/11/2018   OPHTHALMOLOGY EXAM  04/03/2018   COVID-19 Vaccine (3 - Pfizer risk series) 06/09/2020   TETANUS/TDAP  10/16/2021   URINE MICROALBUMIN  03/28/2022    Colorectal cancer screening: No longer required.   Mammogram status: Patient declined  Bone Density status: Patient declined  Lung Cancer Screening: (Low Dose CT Chest recommended if Age 85-80 years, 30  pack-year currently smoking OR have quit w/in 15years.) does not qualify.     Additional Screening:  Hepatitis C Screening: does not qualify  Vision Screening: Recommended annual ophthalmology exams for early detection of glaucoma and other disorders of the eye. Is the patient up to date with their annual eye exam?  No  Who is the provider or what is the name of the office in which the patient attends annual eye exams? Pt unsure of name   Dental Screening: Recommended annual dental exams for proper oral hygiene  Community Resource Referral / Chronic Care Management: CRR required this visit?  No   CCM required this visit?  No      Plan:     I have personally reviewed and noted the following in the patients chart:   Medical and social history Use of alcohol, tobacco or illicit drugs  Current medications and supplements including opioid prescriptions.  Functional ability and status Nutritional status Physical activity Advanced directives List of other physicians Hospitalizations, surgeries, and ER visits in previous 12 months Vitals Screenings to include cognitive, depression, and falls Referrals and appointments  In addition, I have reviewed and discussed with patient certain preventive protocols, quality metrics, and best practice recommendations. A written personalized care plan for preventive services as well as general preventive health recommendations were provided to patient.   Due to this being a telephonic visit, the after visit summary with patients personalized plan was offered to patient via mail or my-chart. Per request, patient was mailed a copy of AVS.   Marta Antu, LPN   08/07/9168  Nurse Health Advisor  Nurse Notes: none

## 2022-03-01 ENCOUNTER — Ambulatory Visit (INDEPENDENT_AMBULATORY_CARE_PROVIDER_SITE_OTHER): Payer: Medicare Other

## 2022-03-01 ENCOUNTER — Ambulatory Visit: Payer: Medicare Other

## 2022-03-01 ENCOUNTER — Telehealth: Payer: Self-pay

## 2022-03-01 VITALS — Ht 66.0 in | Wt 134.0 lb

## 2022-03-01 DIAGNOSIS — Z0000 Encounter for general adult medical examination without abnormal findings: Secondary | ICD-10-CM

## 2022-03-01 NOTE — Telephone Encounter (Signed)
Patient was recently referred to Dr. Trula Slade for her leg pain but she states he told her there was nothing wrong with her circulation in her legs & she states she is still having leg, back & hip pain & wants to know if she can be referred to someone else to see what is causing her pain. ?

## 2022-03-01 NOTE — Patient Instructions (Signed)
Dana Burns , Thank you for taking time to complete your Medicare Wellness Visit. I appreciate your ongoing commitment to your health goals. Please review the following plan we discussed and let me know if I can assist you in the future.   Screening recommendations/referrals: Colonoscopy: No longer required Mammogram: Declined today.  Bone Density: Declined today. Recommended yearly ophthalmology/optometry visit for glaucoma screening and checkup Recommended yearly dental visit for hygiene and checkup  Vaccinations: Influenza vaccine: Up to date Pneumococcal vaccine: Up to date Tdap vaccine: May obtain vaccine at your local pharmacy. Shingles vaccine: May obtain vaccine at your local pharmacy. Covid-19:Booster available at the pharmacy  Advanced directives: Please bring a copy of Living Will and/or Healthcare Power of Attorney for your chart.   Conditions/risks identified: See problem list  Next appointment: Follow up in one year for your annual wellness visit    Preventive Care 86 Years and Older, Female Preventive care refers to lifestyle choices and visits with your health care provider that can promote health and wellness. What does preventive care include? A yearly physical exam. This is also called an annual well check. Dental exams once or twice a year. Routine eye exams. Ask your health care provider how often you should have your eyes checked. Personal lifestyle choices, including: Daily care of your teeth and gums. Regular physical activity. Eating a healthy diet. Avoiding tobacco and drug use. Limiting alcohol use. Practicing safe sex. Taking low-dose aspirin every day. Taking vitamin and mineral supplements as recommended by your health care provider. What happens during an annual well check? The services and screenings done by your health care provider during your annual well check will depend on your age, overall health, lifestyle risk factors, and family history  of disease. Counseling  Your health care provider may ask you questions about your: Alcohol use. Tobacco use. Drug use. Emotional well-being. Home and relationship well-being. Sexual activity. Eating habits. History of falls. Memory and ability to understand (cognition). Work and work Statistician. Reproductive health. Screening  You may have the following tests or measurements: Height, weight, and BMI. Blood pressure. Lipid and cholesterol levels. These may be checked every 5 years, or more frequently if you are over 65 years old. Skin check. Lung cancer screening. You may have this screening every year starting at age 86 if you have a 30-pack-year history of smoking and currently smoke or have quit within the past 15 years. Fecal occult blood test (FOBT) of the stool. You may have this test every year starting at age 86 Flexible sigmoidoscopy or colonoscopy. You may have a sigmoidoscopy every 5 years or a colonoscopy every 10 years starting at age 86 Hepatitis C blood test. Hepatitis B blood test. Sexually transmitted disease (STD) testing. Diabetes screening. This is done by checking your blood sugar (glucose) after you have not eaten for a while (fasting). You may have this done every 1-3 years. Bone density scan. This is done to screen for osteoporosis. You may have this done starting at age 86 Mammogram. This may be done every 1-2 years. Talk to your health care provider about how often you should have regular mammograms. Talk with your health care provider about your test results, treatment options, and if necessary, the need for more tests. Vaccines  Your health care provider may recommend certain vaccines, such as: Influenza vaccine. This is recommended every year. Tetanus, diphtheria, and acellular pertussis (Tdap, Td) vaccine. You may need a Td booster every 10 years. Zoster vaccine. You may need  this after age 86 Pneumococcal 13-valent conjugate (PCV13) vaccine. One  dose is recommended after age 86 Pneumococcal polysaccharide (PPSV23) vaccine. One dose is recommended after age 86 Talk to your health care provider about which screenings and vaccines you need and how often you need them. This information is not intended to replace advice given to you by your health care provider. Make sure you discuss any questions you have with your health care provider. Document Released: 01/13/2016 Document Revised: 09/05/2016 Document Reviewed: 10/18/2015 Elsevier Interactive Patient Education  2017 [place] Prevention in the Home Falls can cause injuries. They can happen to people of all ages. There are many things you can do to make your home safe and to help prevent falls. What can I do on the outside of my home? Regularly fix the edges of walkways and driveways and fix any cracks. Remove anything that might make you trip as you walk through a door, such as a raised step or threshold. Trim any bushes or trees on the path to your home. Use bright outdoor lighting. Clear any walking paths of anything that might make someone trip, such as rocks or tools. Regularly check to see if handrails are loose or broken. Make sure that both sides of any steps have handrails. Any raised decks and porches should have guardrails on the edges. Have any leaves, snow, or ice cleared regularly. Use sand or salt on walking paths during winter. Clean up any spills in your garage right away. This includes oil or grease spills. What can I do in the bathroom? Use night lights. Install grab bars by the toilet and in the tub and shower. Do not use towel bars as grab bars. Use non-skid mats or decals in the tub or shower. If you need to sit down in the shower, use a plastic, non-slip stool. Keep the floor dry. Clean up any water that spills on the floor as soon as it happens. Remove soap buildup in the tub or shower regularly. Attach bath mats securely with double-sided  non-slip rug tape. Do not have throw rugs and other things on the floor that can make you trip. What can I do in the bedroom? Use night lights. Make sure that you have a light by your bed that is easy to reach. Do not use any sheets or blankets that are too big for your bed. They should not hang down onto the floor. Have a firm chair that has side arms. You can use this for support while you get dressed. Do not have throw rugs and other things on the floor that can make you trip. What can I do in the kitchen? Clean up any spills right away. Avoid walking on wet floors. Keep items that you use a lot in easy-to-reach places. If you need to reach something above you, use a strong step stool that has a grab bar. Keep electrical cords out of the way. Do not use floor polish or wax that makes floors slippery. If you must use wax, use non-skid floor wax. Do not have throw rugs and other things on the floor that can make you trip. What can I do with my stairs? Do not leave any items on the stairs. Make sure that there are handrails on both sides of the stairs and use them. Fix handrails that are broken or loose. Make sure that handrails are as long as the stairways. Check any carpeting to make sure that it is firmly attached to  the stairs. Fix any carpet that is loose or worn. Avoid having throw rugs at the top or bottom of the stairs. If you do have throw rugs, attach them to the floor with carpet tape. Make sure that you have a light switch at the top of the stairs and the bottom of the stairs. If you do not have them, ask someone to add them for you. What else can I do to help prevent falls? Wear shoes that: Do not have high heels. Have rubber bottoms. Are comfortable and fit you well. Are closed at the toe. Do not wear sandals. If you use a stepladder: Make sure that it is fully opened. Do not climb a closed stepladder. Make sure that both sides of the stepladder are locked into place. Ask  someone to hold it for you, if possible. Clearly mark and make sure that you can see: Any grab bars or handrails. First and last steps. Where the edge of each step is. Use tools that help you move around (mobility aids) if they are needed. These include: Canes. Walkers. Scooters. Crutches. Turn on the lights when you go into a dark area. Replace any light bulbs as soon as they burn out. Set up your furniture so you have a clear path. Avoid moving your furniture around. If any of your floors are uneven, fix them. If there are any pets around you, be aware of where they are. Review your medicines with your doctor. Some medicines can make you feel dizzy. This can increase your chance of falling. Ask your doctor what other things that you can do to help prevent falls. This information is not intended to replace advice given to you by your health care provider. Make sure you discuss any questions you have with your health care provider. Document Released: 10/13/2009 Document Revised: 05/24/2016 Document Reviewed: 01/21/2015 Elsevier Interactive Patient Education  2017 [employer].

## 2022-03-02 NOTE — Telephone Encounter (Signed)
Called pt regarding referral, no answer or voice mail set up. Phone continuously rings. Will try again later. ?

## 2022-03-12 ENCOUNTER — Encounter: Payer: Self-pay | Admitting: Family Medicine

## 2022-03-12 ENCOUNTER — Ambulatory Visit (INDEPENDENT_AMBULATORY_CARE_PROVIDER_SITE_OTHER): Payer: Medicare Other | Admitting: Family Medicine

## 2022-03-12 VITALS — BP 137/61 | HR 81 | Resp 20 | Ht 66.0 in | Wt 135.0 lb

## 2022-03-12 DIAGNOSIS — M1990 Unspecified osteoarthritis, unspecified site: Secondary | ICD-10-CM | POA: Diagnosis not present

## 2022-03-12 NOTE — Progress Notes (Signed)
Acute Office Visit  Subjective:    Patient ID: Dana Burns, female    DOB: 06/12/1926, 88 y.o.   MRN: [id number]  Chief Complaint  Patient presents with   Leg Pain    Leg pain for 3-4 years Painful to walk, sore Ortho referral? See recent phone note    HPI Patient is in today for chronic leg pain.   Patient reporting years of chronic leg pain that start at right hip and radiates all the way down to her feet. Left leg pain is primarily knee, lower leg, ankle. Reports it aches all the way to the bone. She does have occasional dependent edema. States she was sent to vascular doctor and had a negative workup. She wants to see an "arthritis doctor." Denies any new symptoms. No new weakness, paresthesia, incontinence, dyspnea, swelling outside of baseline.     Past Medical History:  Diagnosis Date   Abdominal pain 18-Oct-2017   Allergy    sneeze, rhinorrhea in am   Anxiety state 09/03/2013   Breast mass, right 10/14/2012   Chicken pox as a child   Chronic pain syndrome 01/23/2015   Chronic renal insufficiency 03/17/2011   Colon cancer ([place])    Complication of anesthesia    agitated when waking up, wakes up shaking, "like  I'm going into shock"   Constipation 10/18/2017   Depression    husband died  3 months ago, pt. admits depression  currently    Diabetic neuropathy ([place]) 03/17/2011   DM (diabetes mellitus) type 2, uncontrolled, with ketoacidosis ([place]) 03/17/2011   Ductal hyperplasia of breast 03/17/2011   Essential hypertension, benign 05/31/2010   Qualifier: Diagnosis of  By: Percival Spanish, MD, Bethann Goo 07/15/2021   Fibromyalgia    GERD (gastroesophageal reflux disease)    H/O echocardiogram    done /w [place] in 2011, saw Dr. Percival Spanish for tachycardia    Headache    History of colon cancer 03/17/2011   Hyperlipidemia    6 years   Hyperlipidemia, mixed    6 years   Hypertension    15 years, reports she has a rapid heartrate    Loss of weight 12/14/2013   Measles as  a child   Osteoarthritis (arthritis due to wear and tear of joints) 03/17/2011   Noted diffusely including neck   Osteopenia 12/14/2013   Pain in finger of right hand 05/15/2017   Pain in joint, lower leg 09/03/2013   Pain of right shoulder region 07/07/2014   Peripheral neuropathy    SOB (shortness of breath) 05/31/2010   Qualifier: Diagnosis of  By: Percival Spanish, MD, Farrel Gordon     Stress 06/01/2013   Tachycardia 05/31/2010   Qualifier: Diagnosis of  By: Percival Spanish, MD, Farrel Gordon     Type 2 diabetes mellitus with peripheral neuropathy ([place]) 03/17/2011   Vitamin B 12 deficiency 07/07/2014   intrinsic factor positive    Past Surgical History:  Procedure Laterality Date   BREAST EXCISIONAL BIOPSY Right 2013   benign   BREAST SURGERY  2013   right- benign   CATARACT EXTRACTION     /w IOL- bilateral    COLON SURGERY     For resection of cancer   Nodule removed     From throat    Family History  Problem Relation Age of Onset   Cancer Mother        Brain   Cancer Father        Colorectal  Cancer Sister        breast   Other Brother        pacemaker   Cancer Brother        pancreatic cancer   Arthritis Sister    Emphysema Brother    Cancer Brother    COPD Brother    Neuropathy Daughter    Fibromyalgia Daughter    Cancer Brother        metastatic colon cancer   Heart disease Neg Hx        Early    Social History   Socioeconomic History   Marital status: Widowed    Spouse name: Not on file   Number of children: 1   Years of education: 9   Highest education level: Not on file  Occupational History   Occupation: Retired  Tobacco Use   Smoking status: Never   Smokeless tobacco: Never  Substance and Sexual Activity   Alcohol use: No   Drug use: No   Sexual activity: Never    Comment: lives with daughter. no dietary restrictions  Other Topics Concern   Not on file  Social History Narrative   Patient lives at home with daughter.    Patient is retired.    Patient is  widowed.    Patient has 1 child.    Patient has a 9th grade education.    Social Determinants of Health   Financial Resource Strain: Low Risk    Difficulty of Paying Living Expenses: Not hard at all  Food Insecurity: No Food Insecurity   Worried About Charity fundraiser in the Last Year: Never true   [place] in the Last Year: Never true  Transportation Needs: No Transportation Needs   Lack of Transportation (Medical): No   Lack of Transportation (Non-Medical): No  Physical Activity: Inactive   Days of Exercise per Week: 0 days   Minutes of Exercise per Session: 0 min  Stress: No Stress Concern Present   Feeling of Stress : Not at all  Social Connections: Not on file  Intimate Partner Violence: Not At Risk   Fear of Current or Ex-Partner: No   Emotionally Abused: No   Physically Abused: No   Sexually Abused: No    Outpatient Medications Prior to Visit  Medication Sig Dispense Refill   acetaminophen (TYLENOL) 500 MG tablet Take 500 mg by mouth every 6 (six) hours as needed for pain.     calcium carbonate (OS-CAL) 600 MG TABS Take 600 mg by mouth 2 (two) times daily with a meal.     Cholecalciferol (VITAMIN D3) 50 MCG (2000 UT) capsule Take 2,000 Units by mouth daily.      citalopram (CELEXA) 20 MG tablet TAKE 1 TABLET BY MOUTH EVERY DAY 90 tablet 2   Cyanocobalamin (B-12 IJ) Place 1,000 mcg under the tongue daily.     diazepam (VALIUM) 5 MG tablet TAKE 1/2 TO 1 TABLET DAILY AS NEEDED FOR ANXIETY 30 tablet 1   gabapentin (NEURONTIN) 800 MG tablet TAKE 1 TABLET BY MOUTH THREE TIMES A DAY 270 tablet 1   pantoprazole (PROTONIX) 40 MG tablet TAKE 1 TABLET BY MOUTH EVERY DAY 90 tablet 1   tiZANidine (ZANAFLEX) 4 MG tablet Take 1 tablet (4 mg total) by mouth 2 (two) times daily as needed for muscle spasms. 180 tablet 1   venlafaxine XR (EFFEXOR-XR) 37.5 MG 24 hr capsule TAKE 1 CAPSULE BY MOUTH DAILY WITH BREAKFAST. 90 capsule 2   No  facility-administered medications prior to  visit.    Allergies  Allergen Reactions   Tape Itching and Rash   Actos [Pioglitazone Hydrochloride] Other (See Comments)    Unknown   Buspar [Buspirone Hcl] Other (See Comments)    Unknown   Lipitor [Atorvastatin Calcium] Other (See Comments)    "It made me hurt all over."   Penicillins Rash    Review of Systems All review of systems negative except what is listed in the HPI     Objective:    Physical Exam Vitals reviewed.  Constitutional:      Appearance: Normal appearance.  Cardiovascular:     Rate and Rhythm: Normal rate and regular rhythm.  Pulmonary:     Effort: Pulmonary effort is normal.     Breath sounds: Normal breath sounds.  Musculoskeletal:     Comments: Patient deferred exam d/t discomfort   Skin:    General: Skin is warm and dry.  Neurological:     General: No focal deficit present.     Mental Status: She is alert and oriented to person, place, and time.  Psychiatric:        Mood and Affect: Mood normal.        Behavior: Behavior normal.        Thought Content: Thought content normal.        Judgment: Judgment normal.    BP 137/61    Pulse 81    Resp 20    Ht '5\' 6"'$  (1.676 m)    Wt 135 lb (61.2 kg)    SpO2 99%    BMI 21.79 kg/m  Wt Readings from Last 3 Encounters:  03/12/22 135 lb (61.2 kg)  03/01/22 134 lb (60.8 kg)  02/26/22 134 lb (60.8 kg)    Health Maintenance Due  Topic Date Due   Zoster Vaccines- Shingrix (1 of 2) Never done   FOOT EXAM  02/11/2018   OPHTHALMOLOGY EXAM  04/03/2018   COVID-19 Vaccine (3 - Pfizer risk series) 06/09/2020   TETANUS/TDAP  10/16/2021   URINE MICROALBUMIN  03/28/2022    There are no preventive care reminders to display for this patient.   Lab Results  Component Value Date   TSH 2.32 10/18/2021   Lab Results  Component Value Date   WBC 4.8 10/18/2021   HGB 12.6 10/18/2021   HCT 39.2 10/18/2021   MCV 85.9 10/18/2021   PLT 230.0 10/18/2021   Lab Results  Component Value Date   NA 139 11/01/2021    K 4.9 11/01/2021   CO2 32 11/01/2021   GLUCOSE 98 11/01/2021   BUN 25 (H) 11/01/2021   CREATININE 1.44 (H) 11/01/2021   BILITOT 0.4 11/01/2021   ALKPHOS 89 11/01/2021   AST 15 11/01/2021   ALT 6 11/01/2021   PROT 6.9 11/01/2021   ALBUMIN 4.3 11/01/2021   CALCIUM 9.8 11/01/2021   GFR 31.33 (L) 11/01/2021   Lab Results  Component Value Date   CHOL 237 (H) 10/18/2021   Lab Results  Component Value Date   HDL 44.80 10/18/2021   Lab Results  Component Value Date   LDLCALC 154 (H) 10/18/2021   Lab Results  Component Value Date   TRIG 191.0 (H) 10/18/2021   Lab Results  Component Value Date   CHOLHDL 5 10/18/2021   Lab Results  Component Value Date   HGBA1C 6.3 10/18/2021       Assessment & Plan:   1. Osteoarthritis, unspecified osteoarthritis type, unspecified site  Per last telephone  note from PCP, recommending referral to [facility]. Patient is agreeable. Discussed the importance of staying active to help with arthritis - she refuses home exercises and PT. Can try Voltaren gel, lidocaine patches, etc.  - Ambulatory referral to Orthopedic Surgery  Please contact office for follow-up if symptoms do not improve or worsen. Seek emergency care if symptoms become severe.   Terrilyn Saver, NP

## 2022-03-12 NOTE — Patient Instructions (Signed)
Referral placed - someone will contact you ?With arthritis, it is important to stay active as tolerated, using heating pad, topical Voltaren gel, ice, elevation, etc.  ?

## 2022-03-13 NOTE — Telephone Encounter (Signed)
Pt saw Caleen Jobs on 3/13 regarding leg pain. ?

## 2022-03-16 ENCOUNTER — Other Ambulatory Visit: Payer: Self-pay | Admitting: Family Medicine

## 2022-03-16 DIAGNOSIS — F419 Anxiety disorder, unspecified: Secondary | ICD-10-CM

## 2022-03-16 NOTE — Telephone Encounter (Signed)
Requesting:diazepam '5mg'$   ?Contract: 08/22/2020 ?UDS: 10/18/2021 ?Last Visit: 03/12/2022 w/ Lovena Le ?Next Visit: 04/30/2022 w/ Lovena Le ?Last Refill: 11/14/2021 # 30 and 1RF ? ?Please Advise ? ?

## 2022-03-26 DIAGNOSIS — C44219 Basal cell carcinoma of skin of left ear and external auricular canal: Secondary | ICD-10-CM | POA: Diagnosis not present

## 2022-03-26 DIAGNOSIS — C44229 Squamous cell carcinoma of skin of left ear and external auricular canal: Secondary | ICD-10-CM | POA: Diagnosis not present

## 2022-04-03 ENCOUNTER — Ambulatory Visit: Payer: Medicare Other | Admitting: Orthopaedic Surgery

## 2022-04-03 ENCOUNTER — Encounter: Payer: Self-pay | Admitting: Orthopaedic Surgery

## 2022-04-03 VITALS — BP 147/86 | HR 80 | Ht 66.0 in | Wt 135.0 lb

## 2022-04-03 DIAGNOSIS — M48061 Spinal stenosis, lumbar region without neurogenic claudication: Secondary | ICD-10-CM | POA: Diagnosis not present

## 2022-04-09 NOTE — Progress Notes (Signed)
? ?Office Visit Note ?  ?Patient: Dana Burns           ?Date of Birth: [birth year]           ?MRN: [id number] ?Visit Date: 04/03/2022 ?             ?Requested by: Terrilyn Saver, NP ?[address] ?[address] ?[place],  [place] [address] ?PCP: Mosie Lukes, MD ? ? ?Assessment & Plan: ?Visit Diagnoses:  ?1. Spinal stenosis of lumbar region, unspecified whether neurogenic claudication present   ? ? ?Plan: We will set patient up for epidural at L4-5x1 see if she can get some relief.  We discussed normal treatment options for spinal stenosis moderate to severe would be surgically treated and she states in 89 she does not want any surgery.  Follow-up after ? ?Epidural if she has ongoing symptoms. ? ?Follow-Up Instructions: No follow-ups on file.  ? ?Orders:  ?Orders Placed This Encounter  ?Procedures  ? Ambulatory referral to Physical Medicine Rehab  ? ?No orders of the defined types were placed in this encounter. ? ? ? ? Procedures: ?No procedures performed ? ? ?Clinical Data: ?No additional findings. ? ? ?Subjective: ?Chief Complaint  ?Patient presents with  ? Lower Back - Pain  ? ? ?HPI 88 year old female new patient visit with back pain.  She had an MRI scan last year plain radiographs prior to that.  She states she has back pain that radiates both legs started in her right leg now on her left.  She has has a history of neuropathy and also arthritis.  She ambulates with a cane she has been on gabapentin tizanidine and also Tylenol.  MRI scan has already been obtained from 05/29/2021 and is available for review.  Patient denies neurogenic claudication symptoms.  MRI scan showed no evidence of malignancy.  She did have moderate severe stenosis at L4-5 with bilateral lateral recess stenosis left greater than right. ? ?Review of Systems positive history of peripheral neuropathy shoulder pain history of colon cancer GERD.  Type 2 diabetes with peripheral neuropathy, positive anxiety.  All other systems  noncontributory HPI ? ? ?Objective: ?Vital Signs: BP (!) 147/86   Pulse 80   Ht '5\' 6"'$  (1.676 m)   Wt 135 lb (61.2 kg)   BMI 21.79 kg/m?  ? ?Physical Exam ?Constitutional:   ?   Appearance: She is well-developed.  ?HENT:  ?   Head: Normocephalic.  ?   Right Ear: External ear normal.  ?   Left Ear: External ear normal. There is no impacted cerumen.  ?Eyes:  ?   Pupils: Pupils are equal, round, and reactive to light.  ?Neck:  ?   Thyroid: No thyromegaly.  ?   Trachea: No tracheal deviation.  ?Cardiovascular:  ?   Rate and Rhythm: Normal rate.  ?Pulmonary:  ?   Effort: Pulmonary effort is normal.  ?Abdominal:  ?   Palpations: Abdomen is soft.  ?Musculoskeletal:  ?   Cervical back: No rigidity.  ?Skin: ?   General: Skin is warm and dry.  ?Neurological:  ?   Mental Status: She is alert and oriented to person, place, and time.  ?Psychiatric:     ?   Behavior: Behavior normal.  ? ? ?Ortho Exam negative logroll hips negative straight leg raising 90 degrees knee and ankle jerk are intact.  She does have sciatic notch tenderness both right and left.  Mild trochanteric bursal tenderness.  She is Dealer with  a cane.  Short stride gait. ? ?Specialty Comments:  ?MRI LUMBAR SPINE WITHOUT CONTRAST ?  ?TECHNIQUE: ?Multiplanar, multisequence MR imaging of the lumbar spine was ?performed. No intravenous contrast was administered. ?  ?COMPARISON:  None. ?  ?FINDINGS: ?Segmentation:  Standard. ?  ?Alignment: 1-2 mm retrolisthesis of L2 on L3. Levocurvature of the ?lumbar spine. ?  ?Vertebrae:  No fracture, evidence of discitis, or bone lesion. ?  ?Conus medullaris and cauda equina: Conus extends to the T12 level. ?Conus and cauda equina appear normal. ?  ?Paraspinal and other soft tissues: No acute paraspinal abnormality. ?Small right renal cysts. ?  ?Disc levels: ?  ?Disc spaces: Disc desiccation throughout the lumbar spine. Mild disc ?height loss at L1-2 and L2-3. ?  ?T12-L1: No significant disc bulge. No evidence of neural  foraminal ?stenosis. No central canal stenosis. ?  ?L1-L2: Mild broad-based disc bulge. Mild bilateral facet ?arthropathy. No evidence of neural foraminal stenosis. No central ?canal stenosis. ?  ?L2-L3: Mild broad-based disc bulge. Severe bilateral facet ?arthropathy. No evidence of neural foraminal stenosis. No central ?canal stenosis. ?  ?L3-L4: Broad-based disc bulge. Severe bilateral facet arthropathy ?with ligamentum flavum infolding. Moderate spinal stenosis. ?Bilateral lateral recess stenosis. No evidence of neural foraminal ?stenosis. ?  ?L4-L5: Broad-based disc bulge. Severe bilateral facet arthropathy ?with ligamentum flavum infolding. Moderate-severe spinal stenosis. ?Bilateral lateral recess stenosis, left greater than right. Right ?lateral recess stenosis may result in impingement of the left ?intraspinal L5 nerve root. No evidence of neural foraminal stenosis. ?  ?L5-S1: No significant disc bulge. Severe bilateral facet ?arthropathy. Mild left foraminal narrowing. No right foraminal ?narrowing. No central canal stenosis. ?  ?IMPRESSION: ?1. Diffuse lumbar spine spondylosis as described above. ?2.  No acute osseous injury of the lumbar spine. ?3. No aggressive osseous lesion to suggest malignancy. ?  ?  ?Electronically Signed ?  By: Kathreen Devoid ?  On: 05/29/2021 10:29 ? ?Imaging: ?No results found. ? ? ?PMFS History: ?Patient Active Problem List  ? Diagnosis Date Noted  ? Foot pain, bilateral 01/22/2022  ? Skin lesion 01/22/2022  ? Diminished pulses in lower extremity 01/22/2022  ? Urinary hesitancy 10/13/2021  ? Palpitations 07/18/2021  ? Allergy 07/17/2021  ? Colon cancer ([facility]) 07/17/2021  ? Complication of anesthesia 07/17/2021  ? Depression 07/17/2021  ? H/O echocardiogram 07/17/2021  ? Headache 07/17/2021  ? Falls 07/15/2021  ? Abdominal pain 09/29/2017  ? Constipation 09/29/2017  ? Pain in finger of right hand 05/15/2017  ? Chronic pain syndrome 01/23/2015  ? Vitamin B 12 deficiency 07/07/2014   ? Pain of right shoulder region 07/07/2014  ? Chicken pox   ? Measles   ? Peripheral neuropathy 07/01/2014  ? Loss of weight 12/14/2013  ? Osteopenia 12/14/2013  ? Anxiety state 09/03/2013  ? Pain in joint, lower leg 09/03/2013  ? Fibromyalgia 06/01/2013  ? Breast mass, right 10/14/2012  ? Type 2 diabetes mellitus with peripheral neuropathy ([facility]) 03/17/2011  ? Osteoarthritis (arthritis due to wear and tear of joints) 03/17/2011  ? Diabetic neuropathy ([facility]) 03/17/2011  ? Chronic renal insufficiency 03/17/2011  ? History of colon cancer 03/17/2011  ? GERD (gastroesophageal reflux disease) 03/17/2011  ? Ductal hyperplasia of breast 03/17/2011  ? Essential hypertension, benign 05/31/2010  ? Tachycardia 05/31/2010  ? SOB (shortness of breath) 05/31/2010  ? ?Past Medical History:  ?Diagnosis Date  ? Abdominal pain 09/29/2017  ? Allergy   ? sneeze, rhinorrhea in am  ? Anxiety state 09/03/2013  ? Breast mass,  right 10/14/2012  ? Chicken pox as a child  ? Chronic pain syndrome 01/23/2015  ? Chronic renal insufficiency 03/17/2011  ? Colon cancer ([facility])   ? Complication of anesthesia   ? agitated when waking up, wakes up shaking, "like  I'm going into shock"  ? Constipation 09/29/2017  ? Depression   ? husband died  3 months ago, pt. admits depression  currently   ? Diabetic neuropathy ([facility]) 03/17/2011  ? DM (diabetes mellitus) type 2, uncontrolled, with ketoacidosis ([facility]) 03/17/2011  ? Ductal hyperplasia of breast 03/17/2011  ? Essential hypertension, benign 05/31/2010  ? Qualifier: Diagnosis of  By: Percival Spanish, MD, Farrel Gordon    ? Falls 07/15/2021  ? Fibromyalgia   ? GERD (gastroesophageal reflux disease)   ? H/O echocardiogram   ? done /w [place] in 2011, saw Dr. Percival Spanish for tachycardia   ? Headache   ? History of colon cancer 03/17/2011  ? Hyperlipidemia   ? 6 years  ? Hyperlipidemia, mixed   ? 6 years  ? Hypertension   ? 15 years, reports she has a rapid heartrate   ? Loss of weight 12/14/2013  ? Measles as a child  ? Osteoarthritis  (arthritis due to wear and tear of joints) 03/17/2011  ? Noted diffusely including neck  ? Osteopenia 12/14/2013  ? Pain in finger of right hand 05/15/2017  ? Pain in joint, lower leg 09/03/2013  ? Pain of right shoulder regio

## 2022-04-11 ENCOUNTER — Telehealth: Payer: Self-pay | Admitting: Physical Medicine and Rehabilitation

## 2022-04-11 NOTE — Telephone Encounter (Signed)
Pt called to schedule appt with Ernestina Patches  ? ?No answer on line to nurse ? ?Sending msg  ? ?Please call Daughter [name] back at [phone] ?

## 2022-04-23 DIAGNOSIS — Z85820 Personal history of malignant melanoma of skin: Secondary | ICD-10-CM | POA: Diagnosis not present

## 2022-04-23 DIAGNOSIS — L821 Other seborrheic keratosis: Secondary | ICD-10-CM | POA: Diagnosis not present

## 2022-04-23 DIAGNOSIS — Z08 Encounter for follow-up examination after completed treatment for malignant neoplasm: Secondary | ICD-10-CM | POA: Diagnosis not present

## 2022-04-30 ENCOUNTER — Encounter: Payer: Self-pay | Admitting: Family Medicine

## 2022-04-30 ENCOUNTER — Ambulatory Visit (INDEPENDENT_AMBULATORY_CARE_PROVIDER_SITE_OTHER): Payer: Medicare Other | Admitting: Family Medicine

## 2022-04-30 ENCOUNTER — Ambulatory Visit: Payer: Medicare Other | Admitting: Family Medicine

## 2022-04-30 VITALS — BP 136/68 | HR 89 | Ht 66.0 in | Wt 137.2 lb

## 2022-04-30 DIAGNOSIS — I10 Essential (primary) hypertension: Secondary | ICD-10-CM

## 2022-04-30 DIAGNOSIS — E782 Mixed hyperlipidemia: Secondary | ICD-10-CM

## 2022-04-30 DIAGNOSIS — K219 Gastro-esophageal reflux disease without esophagitis: Secondary | ICD-10-CM

## 2022-04-30 DIAGNOSIS — E875 Hyperkalemia: Secondary | ICD-10-CM

## 2022-04-30 DIAGNOSIS — M1990 Unspecified osteoarthritis, unspecified site: Secondary | ICD-10-CM | POA: Diagnosis not present

## 2022-04-30 DIAGNOSIS — E1142 Type 2 diabetes mellitus with diabetic polyneuropathy: Secondary | ICD-10-CM | POA: Diagnosis not present

## 2022-04-30 DIAGNOSIS — E538 Deficiency of other specified B group vitamins: Secondary | ICD-10-CM | POA: Diagnosis not present

## 2022-04-30 MED ORDER — PANTOPRAZOLE SODIUM 40 MG PO TBEC: 40.0000 mg | DELAYED_RELEASE_TABLET | Freq: Every day | ORAL | 1 refills | Status: DC

## 2022-04-30 NOTE — Progress Notes (Signed)
? ?Established Patient Office Visit ? ?Subjective   ?Patient ID: Dana Burns, female    DOB: 1926-06-03  Age: 88 y.o. MRN: [id number] ? ?CC: routine f/u, no complaints/concerns  ? ? ?HPI ? ?Osteoarthritis/Chronic pain/Fibromyalgia: ?- Medications: tizanidine 4 mg BID PRN (usually only takes 1/2 day), gabapentin 800 mg BID ?- She has an appointment next week for back injection  ?- No new concerns ? ?Anxiety: ?- Medications: Diazepam 5 mg daily PRN, Effexor 37.5 mg daily, Celexa 20 mg daily   ?- No SI/HI ? ?GERD: ?- Medications: Protonix 40 mg daily ?- No major symptoms or concerns. ? ? ?HYPERTENSION: ?- Medications: none ?- Compliance: n/a ?- Checking BP at home: 120-130/80s ?- Denies any SOB, recurrent headaches, CP, vision changes, LE edema, dizziness, palpitations, or medication side effects. ?- Diet: low sodium/carbs ?- Exercise: as tolerated  ? ? ?DIABETES: ?- Checking BG at home: 90-100s ; last A1c 6 months ago = 6.3% ?- Medications: none ?- Compliance: n/a ?- Diet: low sodium/carbs ?- Exercise: as tolerated  ?- Denies symptoms of hypoglycemia, polyuria, polydipsia, numbness extremities, foot ulcers/trauma, wounds that are not healing, medication side effects  ? ? ?B12 deficiency: ?- Medications: SL cyanocobalamin 1,000 mg daily (sometimes forgets) ? ? ? ? ? ? ? ?ROS ?All review of systems negative except what is listed in the HPI ? ?  ?Objective:  ?  ? ?BP 136/68   Pulse 89   Ht '5\' 6"'$  (1.676 m)   Wt 137 lb 3.2 oz (62.2 kg)   BMI 22.14 kg/m?  ? ? ?Physical Exam ?Vitals reviewed.  ?Constitutional:   ?   Appearance: Normal appearance. She is normal weight.  ?HENT:  ?   Head: Normocephalic and atraumatic.  ?Cardiovascular:  ?   Rate and Rhythm: Normal rate and regular rhythm.  ?Pulmonary:  ?   Effort: Pulmonary effort is normal.  ?   Breath sounds: Normal breath sounds.  ?Musculoskeletal:  ?   Right lower leg: No edema.  ?   Left lower leg: No edema.  ?Neurological:  ?   General: No focal deficit present.  ?    Mental Status: She is alert and oriented to person, place, and time. Mental status is at baseline.  ?Psychiatric:     ?   Mood and Affect: Mood normal.     ?   Behavior: Behavior normal.     ?   Thought Content: Thought content normal.     ?   Judgment: Judgment normal.  ? ? ? ?No results found for any visits on 04/30/22. ? ? ? ?The ASCVD Risk score (Arnett DK, et al., 2019) failed to calculate for the following reasons: ?  The 2019 ASCVD risk score is only valid for ages 58 to 32 ? ?  ?Assessment & Plan:  ? ?Problem List Items Addressed This Visit   ? ?  ? Cardiovascular and Mediastinum  ? Essential hypertension, benign - Primary  ?  Blood pressure is at goal for age and co-morbidities.  I recommend continuing lifestyle management.   ?- BP goal <130/80 ?- monitor and log blood pressures at home ?- check around the same time each day in a relaxed setting ?- Limit salt to <2000 mg/day ?- Follow DASH eating plan (heart healthy diet) ?- limit alcohol to 2 standard drinks per day for men and 1 per day for women ?- avoid tobacco products ?- get at least 2 hours of regular aerobic exercise weekly ?Patient aware of  signs/symptoms requiring further/urgent evaluation. ?Labs updated today. ? ? ?  ?  ? Relevant Orders  ? Lipid panel  ? TSH  ?  ? Digestive  ? GERD (gastroesophageal reflux disease)  ?  No new concerns. Refilling Protonix. Continue lifestyle management. ? ?  ?  ? Relevant Medications  ? pantoprazole (PROTONIX) 40 MG tablet  ?  ? Endocrine  ? Type 2 diabetes mellitus with peripheral neuropathy ([facility])  ?  Well controlled with last A1c = 6.3% ?Continue lifestyle management ?Discussed diet and exercise ?F/u in 3 months ? ?  ?  ? Relevant Orders  ? Hemoglobin A1c  ? CBC  ? Comprehensive metabolic panel  ? Lipid panel  ? TSH  ?  ? Musculoskeletal and Integument  ? Osteoarthritis (arthritis due to wear and tear of joints)  ?  Keep upcoming appointment with ortho/spine doctor. Continue current regimen, exercise as  tolerated, etc. Patient aware of signs/symptoms requiring further/urgent evaluation.  ? ?  ?  ?  ? Other  ? Vitamin B 12 deficiency  ? Relevant Orders  ? Vitamin B12  ? ? ?Return in about 3 months (around 07/31/2022) for routine pcp f/u .  ? ? ?Terrilyn Saver, NP ? ?

## 2022-04-30 NOTE — Assessment & Plan Note (Signed)
Keep upcoming appointment with ortho/spine doctor. Continue current regimen, exercise as tolerated, etc. Patient aware of signs/symptoms requiring further/urgent evaluation.  ?

## 2022-04-30 NOTE — Assessment & Plan Note (Signed)
Blood pressure is at goal for age and co-morbidities.  I recommend continuing lifestyle management.   ?- BP goal <130/80 ?- monitor and log blood pressures at home ?- check around the same time each day in a relaxed setting ?- Limit salt to <2000 mg/day ?- Follow DASH eating plan (heart healthy diet) ?- limit alcohol to 2 standard drinks per day for men and 1 per day for women ?- avoid tobacco products ?- get at least 2 hours of regular aerobic exercise weekly ?Patient aware of signs/symptoms requiring further/urgent evaluation. ?Labs updated today. ? ? ?

## 2022-04-30 NOTE — Assessment & Plan Note (Signed)
No new concerns. Refilling Protonix. Continue lifestyle management. ?

## 2022-04-30 NOTE — Assessment & Plan Note (Signed)
Well controlled with last A1c = 6.3% ?Continue lifestyle management ?Discussed diet and exercise ?F/u in 3 months ? ?

## 2022-04-30 NOTE — Patient Instructions (Signed)
Glad you are doing well! No changes today. We will let you know when lab results are in.  ? ?

## 2022-05-01 LAB — CBC
HCT: 38.1 % (ref 36.0–46.0)
Hemoglobin: 12.4 g/dL (ref 12.0–15.0)
MCHC: 32.6 g/dL (ref 30.0–36.0)
MCV: 84 fl (ref 78.0–100.0)
Platelets: 246 10*3/uL (ref 150.0–400.0)
RBC: 4.54 Mil/uL (ref 3.87–5.11)
RDW: 15.9 % — ABNORMAL HIGH (ref 11.5–15.5)
WBC: 4.9 10*3/uL (ref 4.0–10.5)

## 2022-05-01 LAB — LIPID PANEL
Cholesterol: 277 mg/dL — ABNORMAL HIGH (ref 0–200)
HDL: 42.9 mg/dL (ref >39.00)
NonHDL: 234.37
Total CHOL/HDL Ratio: 6
Triglycerides: 219 mg/dL — ABNORMAL HIGH (ref 0.0–149.0)
VLDL: 43.8 mg/dL — ABNORMAL HIGH (ref 0.0–40.0)

## 2022-05-01 LAB — COMPREHENSIVE METABOLIC PANEL
ALT: 9 U/L (ref 0–35)
AST: 20 U/L (ref 0–37)
Albumin: 4.4 g/dL (ref 3.5–5.2)
Alkaline Phosphatase: 65 U/L (ref 39–117)
BUN: 31 mg/dL — ABNORMAL HIGH (ref 6–23)
CO2: 30 mEq/L (ref 19–32)
Calcium: 9.7 mg/dL (ref 8.4–10.5)
Chloride: 99 mEq/L (ref 96–112)
Creatinine, Ser: 1.69 mg/dL — ABNORMAL HIGH (ref 0.40–1.20)
GFR: 25.77 mL/min — ABNORMAL LOW (ref >60.00)
Glucose, Bld: 108 mg/dL — ABNORMAL HIGH (ref 70–99)
Potassium: 5 mEq/L (ref 3.5–5.1)
Sodium: 136 mEq/L (ref 135–145)
Total Bilirubin: 0.3 mg/dL (ref 0.2–1.2)
Total Protein: 7.4 g/dL (ref 6.0–8.3)

## 2022-05-01 LAB — TSH: TSH: 2.56 u[IU]/mL (ref 0.35–5.50)

## 2022-05-01 LAB — VITAMIN B12: Vitamin B-12: 261 pg/mL (ref 211–911)

## 2022-05-01 LAB — LDL CHOLESTEROL, DIRECT: Direct LDL: 202 mg/dL

## 2022-05-01 LAB — HEMOGLOBIN A1C: Hgb A1c MFr Bld: 6.5 % (ref 4.6–6.5)

## 2022-05-02 NOTE — Addendum Note (Signed)
Addended by: Caleen Jobs B on: 05/02/2022 08:31 AM ? ? Modules accepted: Orders ? ?

## 2022-05-02 NOTE — Progress Notes (Signed)
Blood counts stable ?Kidney function is slightly worse than last time - make sure you are not taking any medications that could damage your kidneys like NSAIDs (ibuprofen, Advil, Aleve), metformin, etc. Please make sure you are staying well hydrated. Let's recheck this in about 2 weeks (schedule a lab appointment) - if not returning to baseline, then we will need to have a kidney doctor take a look. ?Cholesterol is elevated - similar to where you have been over the past several years. If not interested in cholesterol medication, you could try taking an over-the counter fish oil/Omega-3.  ?Thyroid is normal ?B12 is normal ?A1c is stable ? ?

## 2022-05-08 ENCOUNTER — Ambulatory Visit: Payer: Self-pay

## 2022-05-08 ENCOUNTER — Ambulatory Visit: Payer: Medicare Other | Admitting: Physical Medicine and Rehabilitation

## 2022-05-08 VITALS — BP 165/71 | HR 88

## 2022-05-08 DIAGNOSIS — M5416 Radiculopathy, lumbar region: Secondary | ICD-10-CM | POA: Diagnosis not present

## 2022-05-08 MED ORDER — METHYLPREDNISOLONE ACETATE 80 MG/ML IJ SUSP
80.0000 mg | Freq: Once | INTRAMUSCULAR | Status: AC
  Administered 2022-05-08: 80 mg

## 2022-05-08 NOTE — Patient Instructions (Signed)

## 2022-05-08 NOTE — Progress Notes (Signed)
Low back pain radiating down both legs.  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

## 2022-05-18 ENCOUNTER — Other Ambulatory Visit: Payer: Medicare Other

## 2022-05-22 ENCOUNTER — Other Ambulatory Visit (INDEPENDENT_AMBULATORY_CARE_PROVIDER_SITE_OTHER): Payer: Medicare Other

## 2022-05-22 DIAGNOSIS — E782 Mixed hyperlipidemia: Secondary | ICD-10-CM | POA: Diagnosis not present

## 2022-05-23 LAB — COMPREHENSIVE METABOLIC PANEL
ALT: 11 U/L (ref 0–35)
AST: 15 U/L (ref 0–37)
Albumin: 4.2 g/dL (ref 3.5–5.2)
Alkaline Phosphatase: 71 U/L (ref 39–117)
BUN: 30 mg/dL — ABNORMAL HIGH (ref 6–23)
CO2: 32 mEq/L (ref 19–32)
Calcium: 9.9 mg/dL (ref 8.4–10.5)
Chloride: 102 mEq/L (ref 96–112)
Creatinine, Ser: 1.42 mg/dL — ABNORMAL HIGH (ref 0.40–1.20)
GFR: 31.74 mL/min — ABNORMAL LOW (ref >60.00)
Glucose, Bld: 96 mg/dL (ref 70–99)
Potassium: 5.5 mEq/L — ABNORMAL HIGH (ref 3.5–5.1)
Sodium: 141 mEq/L (ref 135–145)
Total Bilirubin: 0.3 mg/dL (ref 0.2–1.2)
Total Protein: 6.8 g/dL (ref 6.0–8.3)

## 2022-05-24 ENCOUNTER — Telehealth: Payer: Self-pay

## 2022-05-24 DIAGNOSIS — E875 Hyperkalemia: Secondary | ICD-10-CM

## 2022-05-24 NOTE — Telephone Encounter (Signed)
labs

## 2022-05-24 NOTE — Addendum Note (Signed)
Addended by: Caleen Jobs B on: 05/24/2022 12:51 PM   Modules accepted: Orders

## 2022-05-25 NOTE — Telephone Encounter (Signed)
done

## 2022-05-27 NOTE — Progress Notes (Signed)
CHRISHELLE ZITO - 88 y.o. female MRN [id number]  Date of birth: 04-13-1926  Office Visit Note: Visit Date: 05/08/2022 PCP: Mosie Lukes, MD Referred by: Mosie Lukes, MD  Subjective: Chief Complaint  Patient presents with   Lower Back - Pain   HPI:  MAYSA LYNN is a 88 y.o. female who comes in today at the request of Dr. Rodell Perna for planned Right L5-S1 Lumbar Interlaminar epidural steroid injection with fluoroscopic guidance.  The patient has failed conservative care including home exercise, medications, time and activity modification.  This injection will be diagnostic and hopefully therapeutic.  Please see requesting physician notes for further details and justification.  ROS Otherwise per HPI.  Assessment & Plan: Visit Diagnoses:    ICD-10-CM   1. Lumbar radiculopathy  M54.16 XR C-ARM NO REPORT    Epidural Steroid injection    methylPREDNISolone acetate (DEPO-MEDROL) injection 80 mg      Plan: No additional findings.   Meds & Orders:  Meds ordered this encounter  Medications   methylPREDNISolone acetate (DEPO-MEDROL) injection 80 mg    Orders Placed This Encounter  Procedures   XR C-ARM NO REPORT   Epidural Steroid injection    Follow-up: Return if symptoms worsen or fail to improve.   Procedures: No procedures performed  Lumbar Epidural Steroid Injection - Interlaminar Approach with Fluoroscopic Guidance  Patient: DOLL FRAZEE      Date of Birth: 1926/11/25 MRN: [id number] PCP: Mosie Lukes, MD      Visit Date: 05/08/2022   Universal Protocol:     Consent Given By: the patient  Position: PRONE  Additional Comments: Vital signs were monitored before and after the procedure. Patient was prepped and draped in the usual sterile fashion. The correct patient, procedure, and site was verified.   Injection Procedure Details:   Procedure diagnoses: Lumbar radiculopathy [M54.16]   Meds Administered:  Meds ordered this encounter  Medications    methylPREDNISolone acetate (DEPO-MEDROL) injection 80 mg     Laterality: Right  Location/Site:  L5-S1  Needle: 3.5 in., 20 ga. Tuohy  Needle Placement: Paramedian epidural  Findings:   -Comments: Excellent flow of contrast into the epidural space.  Procedure Details: Using a paramedian approach from the side mentioned above, the region overlying the inferior lamina was localized under fluoroscopic visualization and the soft tissues overlying this structure were infiltrated with 4 ml. of 1% Lidocaine without Epinephrine. The Tuohy needle was inserted into the epidural space using a paramedian approach.   The epidural space was localized using loss of resistance along with counter oblique bi-planar fluoroscopic views.  After negative aspirate for air, blood, and CSF, a 2 ml. volume of Isovue-250 was injected into the epidural space and the flow of contrast was observed. Radiographs were obtained for documentation purposes.    The injectate was administered into the level noted above.   Additional Comments:  The patient tolerated the procedure well Dressing: 2 x 2 sterile gauze and Band-Aid    Post-procedure details: Patient was observed during the procedure. Post-procedure instructions were reviewed.  Patient left the clinic in stable condition.   Clinical History: MRI LUMBAR SPINE WITHOUT CONTRAST   TECHNIQUE: Multiplanar, multisequence MR imaging of the lumbar spine was performed. No intravenous contrast was administered.   COMPARISON:  None.   FINDINGS: Segmentation:  Standard.   Alignment: 1-2 mm retrolisthesis of L2 on L3. Levocurvature of the lumbar spine.   Vertebrae:  No fracture, evidence of discitis, or  bone lesion.   Conus medullaris and cauda equina: Conus extends to the T12 level. Conus and cauda equina appear normal.   Paraspinal and other soft tissues: No acute paraspinal abnormality. Small right renal cysts.   Disc levels:   Disc spaces: Disc  desiccation throughout the lumbar spine. Mild disc height loss at L1-2 and L2-3.   T12-L1: No significant disc bulge. No evidence of neural foraminal stenosis. No central canal stenosis.   L1-L2: Mild broad-based disc bulge. Mild bilateral facet arthropathy. No evidence of neural foraminal stenosis. No central canal stenosis.   L2-L3: Mild broad-based disc bulge. Severe bilateral facet arthropathy. No evidence of neural foraminal stenosis. No central canal stenosis.   L3-L4: Broad-based disc bulge. Severe bilateral facet arthropathy with ligamentum flavum infolding. Moderate spinal stenosis. Bilateral lateral recess stenosis. No evidence of neural foraminal stenosis.   L4-L5: Broad-based disc bulge. Severe bilateral facet arthropathy with ligamentum flavum infolding. Moderate-severe spinal stenosis. Bilateral lateral recess stenosis, left greater than right. Right lateral recess stenosis may result in impingement of the left intraspinal L5 nerve root. No evidence of neural foraminal stenosis.   L5-S1: No significant disc bulge. Severe bilateral facet arthropathy. Mild left foraminal narrowing. No right foraminal narrowing. No central canal stenosis.   IMPRESSION: 1. Diffuse lumbar spine spondylosis as described above. 2.  No acute osseous injury of the lumbar spine. 3. No aggressive osseous lesion to suggest malignancy.     Electronically Signed   By: Kathreen Devoid   On: 05/29/2021 10:29     Objective:  VS:  HT:    WT:   BMI:     BP:(!) 165/71  HR:88bpm  TEMP: ( )  RESP:  Physical Exam Vitals and nursing note reviewed.  Constitutional:      General: She is not in acute distress.    Appearance: Normal appearance. She is not ill-appearing.  HENT:     Head: Normocephalic and atraumatic.     Right Ear: External ear normal.     Left Ear: External ear normal.  Eyes:     Extraocular Movements: Extraocular movements intact.  Cardiovascular:     Rate and Rhythm:  Normal rate.     Pulses: Normal pulses.  Pulmonary:     Effort: Pulmonary effort is normal. No respiratory distress.  Abdominal:     General: There is no distension.     Palpations: Abdomen is soft.  Musculoskeletal:        General: Tenderness present.     Cervical back: Neck supple.     Right lower leg: No edema.     Left lower leg: No edema.     Comments: Patient has good distal strength with no pain over the greater trochanters.  No clonus or focal weakness.  Skin:    Findings: No erythema, lesion or rash.  Neurological:     General: No focal deficit present.     Mental Status: She is alert and oriented to person, place, and time.     Sensory: No sensory deficit.     Motor: No weakness or abnormal muscle tone.     Coordination: Coordination normal.  Psychiatric:        Mood and Affect: Mood normal.        Behavior: Behavior normal.     Imaging: No results found.

## 2022-05-27 NOTE — Procedures (Signed)
Lumbar Epidural Steroid Injection - Interlaminar Approach with Fluoroscopic Guidance  Patient: Dana Burns DATE      Date of Birth: [birth year] MRN: [id number] PCP: Mosie Lukes, MD      Visit Date: 05/08/2022   Universal Protocol:     Consent Given By: the patient  Position: PRONE  Additional Comments: Vital signs were monitored before and after the procedure. Patient was prepped and draped in the usual sterile fashion. The correct patient, procedure, and site was verified.   Injection Procedure Details:   Procedure diagnoses: Lumbar radiculopathy [M54.16]   Meds Administered:  Meds ordered this encounter  Medications   methylPREDNISolone acetate (DEPO-MEDROL) injection 80 mg     Laterality: Right  Location/Site:  L5-S1  Needle: 3.5 in., 20 ga. Tuohy  Needle Placement: Paramedian epidural  Findings:   -Comments: Excellent flow of contrast into the epidural space.  Procedure Details: Using a paramedian approach from the side mentioned above, the region overlying the inferior lamina was localized under fluoroscopic visualization and the soft tissues overlying this structure were infiltrated with 4 ml. of 1% Lidocaine without Epinephrine. The Tuohy needle was inserted into the epidural space using a paramedian approach.   The epidural space was localized using loss of resistance along with counter oblique bi-planar fluoroscopic views.  After negative aspirate for air, blood, and CSF, a 2 ml. volume of Isovue-250 was injected into the epidural space and the flow of contrast was observed. Radiographs were obtained for documentation purposes.    The injectate was administered into the level noted above.   Additional Comments:  The patient tolerated the procedure well Dressing: 2 x 2 sterile gauze and Band-Aid    Post-procedure details: Patient was observed during the procedure. Post-procedure instructions were reviewed.  Patient left the clinic in stable condition.

## 2022-05-29 ENCOUNTER — Other Ambulatory Visit (INDEPENDENT_AMBULATORY_CARE_PROVIDER_SITE_OTHER): Payer: Medicare Other

## 2022-05-29 DIAGNOSIS — E875 Hyperkalemia: Secondary | ICD-10-CM | POA: Diagnosis not present

## 2022-05-30 ENCOUNTER — Telehealth: Payer: Self-pay | Admitting: Physical Medicine and Rehabilitation

## 2022-05-30 LAB — BASIC METABOLIC PANEL
BUN: 33 mg/dL — ABNORMAL HIGH (ref 6–23)
CO2: 30 mEq/L (ref 19–32)
Calcium: 9.7 mg/dL (ref 8.4–10.5)
Chloride: 103 mEq/L (ref 96–112)
Creatinine, Ser: 1.42 mg/dL — ABNORMAL HIGH (ref 0.40–1.20)
GFR: 31.73 mL/min — ABNORMAL LOW (ref >60.00)
Glucose, Bld: 98 mg/dL (ref 70–99)
Potassium: 5 mEq/L (ref 3.5–5.1)
Sodium: 140 mEq/L (ref 135–145)

## 2022-05-30 NOTE — Telephone Encounter (Signed)
Pt's daughter call to schedule an appt for pt. Please call pt at [phone]

## 2022-05-31 ENCOUNTER — Telehealth: Payer: Self-pay | Admitting: Physical Medicine and Rehabilitation

## 2022-05-31 NOTE — Telephone Encounter (Signed)
I called-injection done on 05/09-received short relief x 2 days from last injection. No new injury. Please advise.

## 2022-05-31 NOTE — Telephone Encounter (Signed)
Pt's daughter called requesting a call back to set an appt. Please call pt at [phone].

## 2022-05-31 NOTE — Telephone Encounter (Signed)
Duplicate request-see other note.

## 2022-06-04 ENCOUNTER — Encounter: Payer: Self-pay | Admitting: Physical Medicine and Rehabilitation

## 2022-06-04 ENCOUNTER — Encounter: Payer: Self-pay | Admitting: *Deleted

## 2022-06-04 ENCOUNTER — Ambulatory Visit: Payer: Medicare Other | Admitting: Physical Medicine and Rehabilitation

## 2022-06-04 VITALS — BP 148/81 | HR 78

## 2022-06-04 DIAGNOSIS — M4726 Other spondylosis with radiculopathy, lumbar region: Secondary | ICD-10-CM | POA: Diagnosis not present

## 2022-06-04 DIAGNOSIS — M48062 Spinal stenosis, lumbar region with neurogenic claudication: Secondary | ICD-10-CM

## 2022-06-04 DIAGNOSIS — M5416 Radiculopathy, lumbar region: Secondary | ICD-10-CM | POA: Diagnosis not present

## 2022-06-04 DIAGNOSIS — R269 Unspecified abnormalities of gait and mobility: Secondary | ICD-10-CM | POA: Diagnosis not present

## 2022-06-04 DIAGNOSIS — M47816 Spondylosis without myelopathy or radiculopathy, lumbar region: Secondary | ICD-10-CM

## 2022-06-04 NOTE — Progress Notes (Unsigned)
Dana Burns - 88 y.o. female MRN [id number]  Date of birth: [birth year]  Office Visit Note: Visit Date: 06/04/2022 PCP: Mosie Lukes, MD Referred by: Mosie Lukes, MD  Subjective: Chief Complaint  Patient presents with   Lower Back - Pain   Right Leg - Pain   Left Leg - Pain   HPI: Dana Burns is a 88 y.o. female who comes in today for evaluation of chronic, worsening and severe bilateral lower back pain radiating to legs and feet. Patient reports pain has been ongoing for several years and is exacerbated by prolonged standing and walking. Patient states she is only able to walk short distances and takes breaks frequently. She describes her pain as a aching and sore sensation, also reports numbness/tingling to bilateral legs. Patient reports some relief of pain with rest and use of over the counter medications as needed. Patients lumbar MRI imaging from 2022 exhibits moderate spinal canal stenosis at L3-L4. There is also severe bilateral facet arthropathy and moderate-severe spinal canal stenosis at the level of L4-L5. Patient had right L5-S1 interlaminar epidural steroid injection performed in our office on 05/08/2022, she reports minimal to no relief of pain with this procedure. Patient reports pain is negatively impacting her daily life. She is currently using cane to assist with ambulation and prevent falls. Patient denies focal weakness. Patient denies recent trauma or falls.   Review of Systems  Musculoskeletal:  Positive for back pain.  Neurological:  Positive for tingling. Negative for focal weakness and weakness.  All other systems reviewed and are negative. Otherwise per HPI.  Assessment & Plan: Visit Diagnoses:    ICD-10-CM   1. Lumbar radiculopathy  M54.16 Ambulatory referral to Physical Medicine Rehab    2. Spinal stenosis of lumbar region with neurogenic claudication  M48.062 Ambulatory referral to Physical Medicine Rehab    3. Other spondylosis with radiculopathy,  lumbar region  M47.26     4. Facet arthropathy, lumbar  M47.816 Ambulatory referral to Physical Medicine Rehab    5. Gait abnormality  R26.9        Plan: Findings:  Chronic, worsening and severe bilateral lower back pain radiating down to bilateral legs and feet.  Patient continues to have severe pain despite good conservative therapy such as rest and use of over-the-counter medications as needed.  Patient's clinical presentation and exam are consistent with neurogenic claudication as a result of spinal canal stenosis.  She does have moderate-severe spinal canal stenosis at the level of L4-L5.  We believe the next step is to perform a diagnostic and hopefully therapeutic bilateral L4 transforaminal epidural steroid injection under fluoroscopic guidance.  Patient does voice issues with anxiety related to injection procedure. She is currently taking Valium at home as needed and is instructed to take 1 tablet (5 mg) 1 hour prior to injection procedure to help with anxiety.  Patient encouraged to remain active as tolerated and to continue using cane to assist with ambulation and prevent falls.  We will call patient to schedule injection after insurance approval.  No red flag symptoms noted upon exam today.   Meds & Orders: No orders of the defined types were placed in this encounter.   Orders Placed This Encounter  Procedures   Ambulatory referral to Physical Medicine Rehab    Follow-up: Return for Bilateral L4 transforaminal epidural steroid injection.   Procedures: No procedures performed      Clinical History: MRI LUMBAR SPINE WITHOUT CONTRAST   TECHNIQUE: Multiplanar,  multisequence MR imaging of the lumbar spine was performed. No intravenous contrast was administered.   COMPARISON:  None.   FINDINGS: Segmentation:  Standard.   Alignment: 1-2 mm retrolisthesis of L2 on L3. Levocurvature of the lumbar spine.   Vertebrae:  No fracture, evidence of discitis, or bone lesion.   Conus  medullaris and cauda equina: Conus extends to the T12 level. Conus and cauda equina appear normal.   Paraspinal and other soft tissues: No acute paraspinal abnormality. Small right renal cysts.   Disc levels:   Disc spaces: Disc desiccation throughout the lumbar spine. Mild disc height loss at L1-2 and L2-3.   T12-L1: No significant disc bulge. No evidence of neural foraminal stenosis. No central canal stenosis.   L1-L2: Mild broad-based disc bulge. Mild bilateral facet arthropathy. No evidence of neural foraminal stenosis. No central canal stenosis.   L2-L3: Mild broad-based disc bulge. Severe bilateral facet arthropathy. No evidence of neural foraminal stenosis. No central canal stenosis.   L3-L4: Broad-based disc bulge. Severe bilateral facet arthropathy with ligamentum flavum infolding. Moderate spinal stenosis. Bilateral lateral recess stenosis. No evidence of neural foraminal stenosis.   L4-L5: Broad-based disc bulge. Severe bilateral facet arthropathy with ligamentum flavum infolding. Moderate-severe spinal stenosis. Bilateral lateral recess stenosis, left greater than right. Right lateral recess stenosis may result in impingement of the left intraspinal L5 nerve root. No evidence of neural foraminal stenosis.   L5-S1: No significant disc bulge. Severe bilateral facet arthropathy. Mild left foraminal narrowing. No right foraminal narrowing. No central canal stenosis.   IMPRESSION: 1. Diffuse lumbar spine spondylosis as described above. 2.  No acute osseous injury of the lumbar spine. 3. No aggressive osseous lesion to suggest malignancy.     Electronically Signed   By: Kathreen Devoid   On: 05/29/2021 10:29   She reports that she has never smoked. She has never used smokeless tobacco.  Recent Labs    10/18/21 1508 04/30/22 1623  HGBA1C 6.3 6.5    Objective:  VS:  HT:    WT:   BMI:     BP: (!) 148/81  HR:78bpm  TEMP: ( )  RESP:  Physical Exam Vitals  and nursing note reviewed.  HENT:     Head: Normocephalic and atraumatic.     Right Ear: External ear normal.     Left Ear: External ear normal.     Nose: Nose normal.     Mouth/Throat:     Mouth: Mucous membranes are moist.  Eyes:     Extraocular Movements: Extraocular movements intact.  Cardiovascular:     Rate and Rhythm: Normal rate.     Pulses: Normal pulses.  Pulmonary:     Effort: Pulmonary effort is normal.  Abdominal:     General: Abdomen is flat. There is no distension.  Musculoskeletal:        General: Tenderness present.     Cervical back: Normal range of motion.     Comments: Pt is slow to rise from seated position to standing. Good lumbar range of motion. Strong distal strength without clonus, no pain upon palpation of greater trochanters. Sensation intact bilaterally. Ambulates with cane, gait slow and unsteady.   Skin:    General: Skin is warm and dry.     Capillary Refill: Capillary refill takes less than 2 seconds.  Neurological:     Mental Status: She is alert and oriented to person, place, and time.     Gait: Gait abnormal.  Psychiatric:  Mood and Affect: Mood normal.        Behavior: Behavior normal.    Ortho Exam  Imaging: No results found.  Past Medical/Family/Surgical/Social History: Medications & Allergies reviewed per EMR, new medications updated. Patient Active Problem List   Diagnosis Date Noted   Foot pain, bilateral 01/22/2022   Skin lesion 01/22/2022   Diminished pulses in lower extremity 01/22/2022   Urinary hesitancy 10/13/2021   Palpitations 07/18/2021   Allergy 07/17/2021   Colon cancer ([place]) 45/80/9983   Complication of anesthesia 07/17/2021   Depression 07/17/2021   H/O echocardiogram 07/17/2021   Headache 07/17/2021   Falls 07/15/2021   Abdominal pain 10-04-17   Constipation 10/04/17   Pain in finger of right hand 05/15/2017   Chronic pain syndrome 01/23/2015   Vitamin B 12 deficiency 07/07/2014   Pain of right  shoulder region 07/07/2014   Chicken pox    Measles    Peripheral neuropathy 07/01/2014   Loss of weight 12/14/2013   Osteopenia 12/14/2013   Anxiety state 09/03/2013   Pain in joint, lower leg 09/03/2013   Fibromyalgia 06/01/2013   Breast mass, right 10/14/2012   Type 2 diabetes mellitus with peripheral neuropathy ([place]) 03/17/2011   Osteoarthritis (arthritis due to wear and tear of joints) 03/17/2011   Diabetic neuropathy ([place]) 03/17/2011   Chronic renal insufficiency 03/17/2011   History of colon cancer 03/17/2011   GERD (gastroesophageal reflux disease) 03/17/2011   Ductal hyperplasia of breast 03/17/2011   Essential hypertension, benign 05/31/2010   Tachycardia 05/31/2010   SOB (shortness of breath) 05/31/2010   Past Medical History:  Diagnosis Date   Abdominal pain Oct 04, 2017   Allergy    sneeze, rhinorrhea in am   Anxiety state 09/03/2013   Breast mass, right 10/14/2012   Chicken pox as a child   Chronic pain syndrome 01/23/2015   Chronic renal insufficiency 03/17/2011   Colon cancer ([place])    Complication of anesthesia    agitated when waking up, wakes up shaking, "like  I'm going into shock"   Constipation Oct 04, 2017   Depression    husband died  3 months ago, pt. admits depression  currently    Diabetic neuropathy ([place]) 03/17/2011   DM (diabetes mellitus) type 2, uncontrolled, with ketoacidosis ([place]) 03/17/2011   Ductal hyperplasia of breast 03/17/2011   Essential hypertension, benign 05/31/2010   Qualifier: Diagnosis of  By: Percival Spanish, MD, Bethann Goo 07/15/2021   Fibromyalgia    GERD (gastroesophageal reflux disease)    H/O echocardiogram    done /w [place] in 2011, saw Dr. Percival Spanish for tachycardia    Headache    History of colon cancer 03/17/2011   Hyperlipidemia    6 years   Hyperlipidemia, mixed    6 years   Hypertension    15 years, reports she has a rapid heartrate    Loss of weight 12/14/2013   Measles as a child   Osteoarthritis (arthritis due  to wear and tear of joints) 03/17/2011   Noted diffusely including neck   Osteopenia 12/14/2013   Pain in finger of right hand 05/15/2017   Pain in joint, lower leg 09/03/2013   Pain of right shoulder region 07/07/2014   Peripheral neuropathy    SOB (shortness of breath) 05/31/2010   Qualifier: Diagnosis of  By: Percival Spanish MD, Farrel Gordon     Stress 06/01/2013   Tachycardia 05/31/2010   Qualifier: Diagnosis of  By: Percival Spanish MD, Farrel Gordon     Type 2  diabetes mellitus with peripheral neuropathy ([place]) 03/17/2011   Vitamin B 12 deficiency 07/07/2014   intrinsic factor positive   Family History  Problem Relation Age of Onset   Cancer Mother        Brain   Cancer Father        Colorectal   Cancer Sister        breast   Other Brother        pacemaker   Cancer Brother        pancreatic cancer   Arthritis Sister    Emphysema Brother    Cancer Brother    COPD Brother    Neuropathy Daughter    Fibromyalgia Daughter    Cancer Brother        metastatic colon cancer   Heart disease Neg Hx        Early   Past Surgical History:  Procedure Laterality Date   BREAST EXCISIONAL BIOPSY Right 2013   benign   BREAST SURGERY  2013   right- benign   CATARACT EXTRACTION     /w IOL- bilateral    COLON SURGERY     For resection of cancer   Nodule removed     From throat   Social History   Occupational History   Occupation: Retired  Tobacco Use   Smoking status: Never   Smokeless tobacco: Never  Substance and Sexual Activity   Alcohol use: No   Drug use: No   Sexual activity: Never    Comment: lives with daughter. no dietary restrictions

## 2022-06-04 NOTE — Progress Notes (Unsigned)
Pt state lower back pain that travels down to she shin and ankle. Pt state sitting then having to standing up makes the pain worse. Pt state se takes over the counter pain meds to help ease her pain. Pt state her last injection didn't help.  Numeric Pain Rating Scale and Functional Assessment Average Pain 10 Pain Right Now 8 My pain is constant, sharp, dull, tingling, and aching Pain is worse with: sitting, standing, and some activites Pain improves with: heat/ice and medication   In the last MONTH (on 0-10 scale) has pain interfered with the following?  1. General activity like being  able to carry out your everyday physical activities such as walking, climbing stairs, carrying groceries, or moving a chair?  Rating(5)  2. Relation with others like being able to carry out your usual social activities and roles such as  activities at home, at work and in your community. Rating(6)  3. Enjoyment of life such that you have  been bothered by emotional problems such as feeling anxious, depressed or irritable?  Rating(7)

## 2022-06-20 ENCOUNTER — Encounter: Payer: Self-pay | Admitting: Family Medicine

## 2022-06-20 ENCOUNTER — Ambulatory Visit (INDEPENDENT_AMBULATORY_CARE_PROVIDER_SITE_OTHER): Payer: Medicare Other | Admitting: Family Medicine

## 2022-06-20 VITALS — BP 138/84 | HR 85 | Temp 97.9°F | Ht 66.0 in | Wt 139.1 lb

## 2022-06-20 DIAGNOSIS — G6289 Other specified polyneuropathies: Secondary | ICD-10-CM | POA: Diagnosis not present

## 2022-06-20 DIAGNOSIS — M5416 Radiculopathy, lumbar region: Secondary | ICD-10-CM | POA: Diagnosis not present

## 2022-06-20 MED ORDER — ROPINIROLE HCL 0.5 MG PO TABS: 0.5000 mg | ORAL_TABLET | Freq: Every day | ORAL | 0 refills | Status: DC

## 2022-06-20 NOTE — Progress Notes (Signed)
Chief Complaint  Patient presents with   Leg Pain    Both leg are hurting Both feet are hurting      Subjective: Patient is a 88 y.o. female here for f/u.  Here with her daughter.  Patient has a history of lumbar radiculopathy and spinal stenosis.  She has received 2 injections in the last year and neither of them has been helpful.  She has another injection scheduled on 7/11 which will be a slightly different approach.  Her sister has similar issues and is on allopurinol.  She is very interested in trying this.  She also takes Effexor 37.5 mg daily along with Celexa 20 mg daily which helps with neuropathy.  Past Medical History:  Diagnosis Date   Abdominal pain 10/12/17   Allergy    sneeze, rhinorrhea in am   Anxiety state 09/03/2013   Breast mass, right 10/14/2012   Chicken pox as a child   Chronic pain syndrome 01/23/2015   Chronic renal insufficiency 03/17/2011   Colon cancer ([place])    Complication of anesthesia    agitated when waking up, wakes up shaking, "like  I'm going into shock"   Constipation 10/12/2017   Depression    husband died  3 months ago, pt. admits depression  currently    Diabetic neuropathy ([place]) 03/17/2011   DM (diabetes mellitus) type 2, uncontrolled, with ketoacidosis ([place]) 03/17/2011   Ductal hyperplasia of breast 03/17/2011   Essential hypertension, benign 05/31/2010   Qualifier: Diagnosis of  By: Percival Spanish, MD, Bethann Goo 07/15/2021   Fibromyalgia    GERD (gastroesophageal reflux disease)    H/O echocardiogram    done /w [place] in 2011, saw Dr. Percival Spanish for tachycardia    Headache    History of colon cancer 03/17/2011   Hyperlipidemia    6 years   Hyperlipidemia, mixed    6 years   Hypertension    15 years, reports she has a rapid heartrate    Loss of weight 12/14/2013   Measles as a child   Osteoarthritis (arthritis due to wear and tear of joints) 03/17/2011   Noted diffusely including neck   Osteopenia 12/14/2013   Pain in finger of  right hand 05/15/2017   Pain in joint, lower leg 09/03/2013   Pain of right shoulder region 07/07/2014   Peripheral neuropathy    SOB (shortness of breath) 05/31/2010   Qualifier: Diagnosis of  By: Percival Spanish, MD, Farrel Gordon     Stress 06/01/2013   Tachycardia 05/31/2010   Qualifier: Diagnosis of  By: Percival Spanish, MD, Farrel Gordon     Type 2 diabetes mellitus with peripheral neuropathy ([place]) 03/17/2011   Vitamin B 12 deficiency 07/07/2014   intrinsic factor positive    Objective: BP 138/84   Pulse 85   Temp 97.9 F (36.6 C) (Oral)   Ht '5\' 6"'$  (1.676 m)   Wt 139 lb 2 oz (63.1 kg)   SpO2 97%   BMI 22.46 kg/m  General: Awake, appears stated age MSK: Mild TTP diffusely in her feet bilaterally Lungs: No accessory muscle use Psych: normal affect and mood  Assessment and Plan: Other polyneuropathy - Plan: rOPINIRole (REQUIP) 0.5 MG tablet  Lumbar radiculopathy - Plan: rOPINIRole (REQUIP) 0.5 MG tablet  Chronic, not controlled.  Trial Requip 0.5 mg nightly.  Follow-up in 1 month to recheck this.  Could consider stopping Celexa and increasing the dosage of Effexor for better neuropathy control.  She has failed duloxetine in the  past.  I gave her a list of foods associated with lower inflammation as well.  I also encouraged her to keep her appointment on 7/11. The patient and her daughter voiced understanding and agreement to the plan.  [place], DO 06/20/22  5:03 PM

## 2022-06-20 NOTE — Patient Instructions (Addendum)
Ice/cold pack over area for 10-15 min twice daily.  Heat (pad or rice pillow in microwave) over affected area, 10-15 minutes twice daily.   OK to take Tylenol 1000 mg (2 extra strength tabs) or 975 mg (3 regular strength tabs) every 6 hours as needed.  Foods that may reduce pain: 1) Ginger 2) Blueberries 3) Salmon 4) Pumpkin seeds 5) dark chocolate 6) turmeric 7) tart cherries 8) virgin olive oil 9) chilli peppers 10) mint 11) krill oil  Let us know if you need anything.

## 2022-07-02 ENCOUNTER — Other Ambulatory Visit: Payer: Self-pay | Admitting: Family Medicine

## 2022-07-02 DIAGNOSIS — F419 Anxiety disorder, unspecified: Secondary | ICD-10-CM

## 2022-07-02 NOTE — Telephone Encounter (Signed)
Requesting: diazepam '5mg'$   Contract: 08/22/20 UDS: 10/18/21 Last Visit: 06/20/22 w/ Nani Ravens Next Visit: 07/19/22 w/ Nani Ravens Last Refill: 03/16/22 #30 and 1RF  Please Advise

## 2022-07-10 ENCOUNTER — Ambulatory Visit: Payer: Medicare Other | Admitting: Physical Medicine and Rehabilitation

## 2022-07-10 ENCOUNTER — Ambulatory Visit: Payer: Self-pay

## 2022-07-10 ENCOUNTER — Encounter: Payer: Self-pay | Admitting: Physical Medicine and Rehabilitation

## 2022-07-10 VITALS — BP 140/87 | HR 77

## 2022-07-10 DIAGNOSIS — M5416 Radiculopathy, lumbar region: Secondary | ICD-10-CM | POA: Diagnosis not present

## 2022-07-10 MED ORDER — METHYLPREDNISOLONE ACETATE 80 MG/ML IJ SUSP
80.0000 mg | Freq: Once | INTRAMUSCULAR | Status: AC
  Administered 2022-07-10: 80 mg

## 2022-07-10 NOTE — Progress Notes (Signed)
Dana Burns - 88 y.o. female MRN [id number]  Date of birth: 1926-06-24  Office Visit Note: Visit Date: 07/10/2022 PCP: Mosie Lukes, MD Referred by: Mosie Lukes, MD  Subjective: Chief Complaint  Patient presents with   Lower Back - Pain   Right Leg - Pain   Right Foot - Pain   HPI:  Dana Burns is a 88 y.o. female who comes in today at the request of Barnet Pall, FNP for planned Bilateral L4-5 Lumbar Transforaminal epidural steroid injection with fluoroscopic guidance.  The patient has failed conservative care including home exercise, medications, time and activity modification.  This injection will be diagnostic and hopefully therapeutic.  Please see requesting physician notes for further details and justification.  Case is complicated by history of diabetes with bilateral polyneuropathy.   ROS Otherwise per HPI.  Assessment & Plan: Visit Diagnoses:    ICD-10-CM   1. Lumbar radiculopathy  M54.16 XR C-ARM NO REPORT    Epidural Steroid injection    methylPREDNISolone acetate (DEPO-MEDROL) injection 80 mg      Plan: No additional findings.   Meds & Orders:  Meds ordered this encounter  Medications   methylPREDNISolone acetate (DEPO-MEDROL) injection 80 mg    Orders Placed This Encounter  Procedures   XR C-ARM NO REPORT   Epidural Steroid injection    Follow-up: Return for visit to requesting provider as needed.   Procedures: No procedures performed  Lumbosacral Transforaminal Epidural Steroid Injection - Sub-Pedicular Approach with Fluoroscopic Guidance  Patient: Dana Burns      Date of Birth: [birth year] MRN: [id number] PCP: Mosie Lukes, MD      Visit Date: 07/10/2022   Universal Protocol:    Date/Time: 07/10/2022  Consent Given By: the patient  Position: PRONE  Additional Comments: Vital signs were monitored before and after the procedure. Patient was prepped and draped in the usual sterile fashion. The correct patient, procedure, and  site was verified.   Injection Procedure Details:   Procedure diagnoses: Lumbar radiculopathy [M54.16]    Meds Administered:  Meds ordered this encounter  Medications   methylPREDNISolone acetate (DEPO-MEDROL) injection 80 mg    Laterality: Bilateral  Location/Site: L4  Needle:5.0 in., 22 ga.  Short bevel or Quincke spinal needle  Needle Placement: Transforaminal  Findings:    -Comments: Excellent flow of contrast along the nerve, nerve root and into the epidural space.  Procedure Details: After squaring off the end-plates to get a true AP view, the C-arm was positioned so that an oblique view of the foramen as noted above was visualized. The target area is just inferior to the "nose of the scotty dog" or sub pedicular. The soft tissues overlying this structure were infiltrated with 2-3 ml. of 1% Lidocaine without Epinephrine.  The spinal needle was inserted toward the target using a "trajectory" view along the fluoroscope beam.  Under AP and lateral visualization, the needle was advanced so it did not puncture dura and was located close the 6 O'Clock position of the pedical in AP tracterory. Biplanar projections were used to confirm position. Aspiration was confirmed to be negative for CSF and/or blood. A 1-2 ml. volume of Isovue-250 was injected and flow of contrast was noted at each level. Radiographs were obtained for documentation purposes.   After attaining the desired flow of contrast documented above, a 0.5 to 1.0 ml test dose of 0.25% Marcaine was injected into each respective transforaminal space.  The patient was observed for 90  seconds post injection.  After no sensory deficits were reported, and normal lower extremity motor function was noted,   the above injectate was administered so that equal amounts of the injectate were placed at each foramen (level) into the transforaminal epidural space.   Additional Comments:  The patient tolerated the procedure well Dressing:  2 x 2 sterile gauze and Band-Aid    Post-procedure details: Patient was observed during the procedure. Post-procedure instructions were reviewed.  Patient left the clinic in stable condition.    Clinical History: MRI LUMBAR SPINE WITHOUT CONTRAST   TECHNIQUE: Multiplanar, multisequence MR imaging of the lumbar spine was performed. No intravenous contrast was administered.   COMPARISON:  None.   FINDINGS: Segmentation:  Standard.   Alignment: 1-2 mm retrolisthesis of L2 on L3. Levocurvature of the lumbar spine.   Vertebrae:  No fracture, evidence of discitis, or bone lesion.   Conus medullaris and cauda equina: Conus extends to the T12 level. Conus and cauda equina appear normal.   Paraspinal and other soft tissues: No acute paraspinal abnormality. Small right renal cysts.   Disc levels:   Disc spaces: Disc desiccation throughout the lumbar spine. Mild disc height loss at L1-2 and L2-3.   T12-L1: No significant disc bulge. No evidence of neural foraminal stenosis. No central canal stenosis.   L1-L2: Mild broad-based disc bulge. Mild bilateral facet arthropathy. No evidence of neural foraminal stenosis. No central canal stenosis.   L2-L3: Mild broad-based disc bulge. Severe bilateral facet arthropathy. No evidence of neural foraminal stenosis. No central canal stenosis.   L3-L4: Broad-based disc bulge. Severe bilateral facet arthropathy with ligamentum flavum infolding. Moderate spinal stenosis. Bilateral lateral recess stenosis. No evidence of neural foraminal stenosis.   L4-L5: Broad-based disc bulge. Severe bilateral facet arthropathy with ligamentum flavum infolding. Moderate-severe spinal stenosis. Bilateral lateral recess stenosis, left greater than right. Right lateral recess stenosis may result in impingement of the left intraspinal L5 nerve root. No evidence of neural foraminal stenosis.   L5-S1: No significant disc bulge. Severe bilateral  facet arthropathy. Mild left foraminal narrowing. No right foraminal narrowing. No central canal stenosis.   IMPRESSION: 1. Diffuse lumbar spine spondylosis as described above. 2.  No acute osseous injury of the lumbar spine. 3. No aggressive osseous lesion to suggest malignancy.     Electronically Signed   By: Kathreen Devoid   On: 05/29/2021 10:29     Objective:  VS:  HT:    WT:   BMI:     BP:140/87  HR:77bpm  TEMP: ( )  RESP:  Physical Exam Vitals and nursing note reviewed.  Constitutional:      General: She is not in acute distress.    Appearance: Normal appearance. She is not ill-appearing.  HENT:     Head: Normocephalic and atraumatic.     Right Ear: External ear normal.     Left Ear: External ear normal.  Eyes:     Extraocular Movements: Extraocular movements intact.  Cardiovascular:     Rate and Rhythm: Normal rate.     Pulses: Normal pulses.  Pulmonary:     Effort: Pulmonary effort is normal. No respiratory distress.  Abdominal:     General: There is no distension.     Palpations: Abdomen is soft.  Musculoskeletal:        General: Tenderness present.     Cervical back: Neck supple.     Right lower leg: No edema.     Left lower leg: No edema.  Comments: Patient has good distal strength with no pain over the greater trochanters.  No clonus or focal weakness.  Skin:    Findings: No erythema, lesion or rash.  Neurological:     General: No focal deficit present.     Mental Status: She is alert and oriented to person, place, and time.     Sensory: No sensory deficit.     Motor: No weakness or abnormal muscle tone.     Coordination: Coordination normal.  Psychiatric:        Mood and Affect: Mood normal.        Behavior: Behavior normal.      Imaging: No results found.

## 2022-07-10 NOTE — Progress Notes (Signed)
Pt state lower back pain that travels down her right hip, shin and ankle. Pt state sitting then having to standing up makes the pain worse. Pt state se takes over the counter pain meds to help ease her pain.  Numeric Pain Rating Scale and Functional Assessment Average Pain 8   In the last MONTH (on 0-10 scale) has pain interfered with the following?  1. General activity like being  able to carry out your everyday physical activities such as walking, climbing stairs, carrying groceries, or moving a chair?  Rating(10)   +Driver, -BT, -Dye Allergies.

## 2022-07-10 NOTE — Patient Instructions (Signed)

## 2022-07-12 ENCOUNTER — Other Ambulatory Visit: Payer: Self-pay | Admitting: Family Medicine

## 2022-07-12 DIAGNOSIS — G6289 Other specified polyneuropathies: Secondary | ICD-10-CM

## 2022-07-12 DIAGNOSIS — M5416 Radiculopathy, lumbar region: Secondary | ICD-10-CM

## 2022-07-19 ENCOUNTER — Ambulatory Visit: Payer: Medicare Other | Admitting: Family Medicine

## 2022-07-25 NOTE — Procedures (Signed)
Lumbosacral Transforaminal Epidural Steroid Injection - Sub-Pedicular Approach with Fluoroscopic Guidance  Patient: Dana Burns      Date of Birth: 10/08/1926 MRN: [id number] PCP: Mosie Lukes, MD      Visit Date: 07/10/2022   Universal Protocol:    Date/Time: 07/10/2022  Consent Given By: the patient  Position: PRONE  Additional Comments: Vital signs were monitored before and after the procedure. Patient was prepped and draped in the usual sterile fashion. The correct patient, procedure, and site was verified.   Injection Procedure Details:   Procedure diagnoses: Lumbar radiculopathy [M54.16]    Meds Administered:  Meds ordered this encounter  Medications   methylPREDNISolone acetate (DEPO-MEDROL) injection 80 mg    Laterality: Bilateral  Location/Site: L4  Needle:5.0 in., 22 ga.  Short bevel or Quincke spinal needle  Needle Placement: Transforaminal  Findings:    -Comments: Excellent flow of contrast along the nerve, nerve root and into the epidural space.  Procedure Details: After squaring off the end-plates to get a true AP view, the C-arm was positioned so that an oblique view of the foramen as noted above was visualized. The target area is just inferior to the "nose of the scotty dog" or sub pedicular. The soft tissues overlying this structure were infiltrated with 2-3 ml. of 1% Lidocaine without Epinephrine.  The spinal needle was inserted toward the target using a "trajectory" view along the fluoroscope beam.  Under AP and lateral visualization, the needle was advanced so it did not puncture dura and was located close the 6 O'Clock position of the pedical in AP tracterory. Biplanar projections were used to confirm position. Aspiration was confirmed to be negative for CSF and/or blood. A 1-2 ml. volume of Isovue-250 was injected and flow of contrast was noted at each level. Radiographs were obtained for documentation purposes.   After attaining the desired  flow of contrast documented above, a 0.5 to 1.0 ml test dose of 0.25% Marcaine was injected into each respective transforaminal space.  The patient was observed for 90 seconds post injection.  After no sensory deficits were reported, and normal lower extremity motor function was noted,   the above injectate was administered so that equal amounts of the injectate were placed at each foramen (level) into the transforaminal epidural space.   Additional Comments:  The patient tolerated the procedure well Dressing: 2 x 2 sterile gauze and Band-Aid    Post-procedure details: Patient was observed during the procedure. Post-procedure instructions were reviewed.  Patient left the clinic in stable condition.

## 2022-07-31 ENCOUNTER — Other Ambulatory Visit: Payer: Self-pay | Admitting: Family Medicine

## 2022-07-31 DIAGNOSIS — G6289 Other specified polyneuropathies: Secondary | ICD-10-CM

## 2022-07-31 DIAGNOSIS — M5416 Radiculopathy, lumbar region: Secondary | ICD-10-CM

## 2022-08-08 ENCOUNTER — Telehealth: Payer: Self-pay

## 2022-08-08 ENCOUNTER — Telehealth: Payer: Medicare Other | Admitting: Family Medicine

## 2022-08-08 NOTE — Telephone Encounter (Signed)
Patient was scheduled for virtual visit today with Caleen Jobs, NP.  Lovena Le contacted patient who stated she didn't need the appointment because she is coming in next week.  Appointment was cancelled.    [facility] Day - Client TELEPHONE ADVICE RECORD AccessNurse Patient Name: Dana Burns Caller Name [name] Pegram Relationship To Patient Daughter Return Phone Number [phone] (Primary) Chief Complaint Leg Pain Reason for Call Medication Question / Request Initial Comment Caller states she wants to know if something can be called in for pain for her mom's legs and feet. She has an appt with Dr. Charlett Blake on Tuesday. Daughter would like to speak with the nurses to see if anything can be called in until she sees the doctor next week. Translation No Nurse Assessment Nurse: Lenon Curt, RN, Melanie Date/Time (Eastern Time): 08/08/2022 12:25:59 PM Confirm and document reason for call. If symptomatic, describe symptoms. ---Caller requesting something can be called in for pain for mom's legs and feet. Has appt with Dr. Charlett Blake on Tuesday 08/14/22. Does the patient have any new or worsening symptoms? ---Yes Will a triage be completed? ---Yes Related visit to physician within the last 2 weeks? ---Yes Does the PT have any chronic conditions? (i.e. diabetes, asthma, this includes High risk factors for pregnancy, etc.) ---Yes Is this a behavioral health or substance abuse call? ---No Nurse: Lenon Curt, RN, Melanie Date/Time (Eastern Time): 08/08/2022 12:31:42 PM Please select the assessment type ---Verbal order / New medication order Additional Documentation ---[facility] [address] [phone] Does the client directives allow for assistance with medications after hours? ---No PLEASE NOTE: All timestamps contained within this report are represented as [place] Standard Time. CONFIDENTIALTY NOTICE: This fax transmission is intended only for the addressee. It contains information that is legally  privileged, confidential or otherwise protected from use or disclosure. If you are not the intended recipient, you are strictly prohibited from reviewing, disclosing, copying using or disseminating any of this information or taking any action in reliance on or regarding this information. If you have received this fax in error, please notify us immediately by telephone so that we can arrange for its return to [place]. Phone: [phone], Toll-Free: [phone], Fax: [phone] Page: 2 of 3 Call Id: [id number] Guidelines Guideline Title Affirmed Question Affirmed Notes Nurse Date/Time Eilene Ghazi Time) Leg Pain [1] SEVERE pain (e.g., excruciating, unable to do any normal activities) AND [2] not improved after 2 hours of pain medicine Lenon Curt, RN, Threasa Beards 08/08/2022 12:27:48 PM Disp. Time Eilene Ghazi Time) Disposition Final User 08/08/2022 12:32:51 PM See HCP within 4 Hours (or PCP triage) Yes Lenon Curt, RN, Melanie Final Disposition 08/08/2022 12:32:51 PM See HCP within 4 Hours (or PCP triage) Yes Lenon Curt, RN, Threasa Beards Caller Disagree/Comply Comply Caller Understands Yes PreDisposition Call Doctor Care Advice Given Per Guideline SEE HCP (OR PCP TRIAGE) WITHIN 4 HOURS: * IF OFFICE WILL BE OPEN: You need to be seen within the next 3 or 4 hours. Call your doctor (or NP/PA) now or as soon as the office opens. * UCC: Some UCCs can manage patients who are stable and have less serious symptoms (e.g., minor illnesses and injuries). The triager must know the [facility] capabilities before sending a patient there. If unsure, call ahead. * OFFICE: If patient sounds stable and not seriously ill, consult PCP (or follow your office policy) to see if patient can be seen NOW in office. PAIN MEDICINES: CALL BACK IF: * You become worse CARE ADVICE given per Leg Pain (Adult) guideline. Comments User: Erik Obey, RN Date/Time (  Eastern Time): 08/08/2022 12:28:14 PM Neurapathy cold and red is normal. User: Erik Obey, RN  Date/Time Eilene Ghazi Time): 08/08/2022 12:30:29 PM Right > Left User: Erik Obey, RN Date/Time Eilene Ghazi Time): 08/08/2022 12:37:30 PM No answer on the backline. Requested she call back after 1pm to see if office answering phone and get an appt (telehealth) to follow up on this pain issue. If no answer by 2pm will need to go to [facility]

## 2022-08-14 ENCOUNTER — Ambulatory Visit (INDEPENDENT_AMBULATORY_CARE_PROVIDER_SITE_OTHER): Payer: Medicare Other | Admitting: Family Medicine

## 2022-08-14 VITALS — BP 128/72 | HR 81 | Temp 98.1°F | Resp 16 | Ht 66.0 in | Wt 136.6 lb

## 2022-08-14 DIAGNOSIS — N189 Chronic kidney disease, unspecified: Secondary | ICD-10-CM | POA: Diagnosis not present

## 2022-08-14 DIAGNOSIS — D510 Vitamin B12 deficiency anemia due to intrinsic factor deficiency: Secondary | ICD-10-CM | POA: Diagnosis not present

## 2022-08-14 DIAGNOSIS — M79605 Pain in left leg: Secondary | ICD-10-CM | POA: Diagnosis not present

## 2022-08-14 DIAGNOSIS — M79672 Pain in left foot: Secondary | ICD-10-CM

## 2022-08-14 DIAGNOSIS — M79604 Pain in right leg: Secondary | ICD-10-CM

## 2022-08-14 DIAGNOSIS — I10 Essential (primary) hypertension: Secondary | ICD-10-CM | POA: Diagnosis not present

## 2022-08-14 DIAGNOSIS — G6289 Other specified polyneuropathies: Secondary | ICD-10-CM

## 2022-08-14 DIAGNOSIS — E538 Deficiency of other specified B group vitamins: Secondary | ICD-10-CM

## 2022-08-14 DIAGNOSIS — F411 Generalized anxiety disorder: Secondary | ICD-10-CM

## 2022-08-14 DIAGNOSIS — E1142 Type 2 diabetes mellitus with diabetic polyneuropathy: Secondary | ICD-10-CM | POA: Diagnosis not present

## 2022-08-14 DIAGNOSIS — M79671 Pain in right foot: Secondary | ICD-10-CM | POA: Diagnosis not present

## 2022-08-14 MED ORDER — HYDROCODONE-ACETAMINOPHEN 5-325 MG PO TABS: 1.0000 | ORAL_TABLET | Freq: Every evening | ORAL | 0 refills | Status: DC | PRN

## 2022-08-14 MED ORDER — VENLAFAXINE HCL ER 37.5 MG PO CP24: 37.5000 mg | ORAL_CAPSULE | Freq: Every day | ORAL | 1 refills | Status: DC

## 2022-08-14 MED ORDER — TIZANIDINE HCL 4 MG PO TABS: 4.0000 mg | ORAL_TABLET | Freq: Two times a day (BID) | ORAL | 1 refills | Status: DC | PRN

## 2022-08-14 MED ORDER — ROPINIROLE HCL 1 MG PO TABS: 1.0000 mg | ORAL_TABLET | Freq: Every day | ORAL | 3 refills | Status: DC

## 2022-08-14 NOTE — Patient Instructions (Signed)
Peripheral Neuropathy Peripheral neuropathy is a type of nerve damage. It affects nerves that carry signals between the spinal cord and the arms, legs, and the rest of the body (peripheral nerves). It does not affect nerves in the spinal cord or brain. In peripheral neuropathy, one nerve or a group of nerves may be damaged. Peripheral neuropathy is a broad category that includes many specific nerve disorders, like diabetic neuropathy, hereditary neuropathy, and carpal tunnel syndrome. What are the causes? This condition may be caused by: Certain diseases, such as: Diabetes. This is the most common cause of peripheral neuropathy. Autoimmune diseases, such as rheumatoid arthritis and systemic lupus erythematosus. Nerve diseases that are passed from parent to child (inherited). Kidney disease. Thyroid disease. Other causes may include: Nerve injury. Pressure or stress on a nerve that lasts a long time. Lack (deficiency) of B vitamins. This can result from alcoholism, poor diet, or a restricted diet. Infections. Some medicines, such as cancer medicines (chemotherapy). Poisonous (toxic) substances, such as lead and mercury. Too little blood flowing to the legs. In some cases, the cause of this condition is not known. What are the signs or symptoms? Symptoms of this condition depend on which of your nerves is damaged. Symptoms in the legs, hands, and arms can include: Loss of feeling (numbness) in the feet, hands, or both. Tingling in the feet, hands, or both. Burning pain. Very sensitive skin. Weakness. Not being able to move a part of the body (paralysis). Clumsiness or poor coordination. Muscle twitching. Loss of balance. Symptoms in other parts of the body can include: Not being able to control your bladder. Feeling dizzy. Sexual problems. How is this diagnosed? Diagnosing and finding the cause of peripheral neuropathy can be difficult. Your health care provider will take your  medical history and do a physical exam. A neurological exam will also be done. This involves checking things that are affected by your brain, spinal cord, and nerves (nervous system). For example, your health care provider will check your reflexes, how you move, and what you can feel. You may have other tests, such as: Blood tests. Electromyogram (EMG) and nerve conduction tests. These tests check nerve function and how well the nerves are controlling the muscles. Imaging tests, such as a CT scan or MRI, to rule out other causes of your symptoms. Removing a small piece of nerve to be examined in a lab (nerve biopsy). Removing and examining a small amount of the fluid that surrounds the brain and spinal cord (lumbar puncture). How is this treated? Treatment for this condition may involve: Treating the underlying cause of the neuropathy, such as diabetes, kidney disease, or vitamin deficiencies. Stopping medicines that can cause neuropathy, such as chemotherapy. Medicine to help relieve pain. Medicines may include: Prescription or over-the-counter pain medicine. Anti-seizure medicine. Antidepressants. Pain-relieving patches that are applied to painful areas of skin. Surgery to relieve pressure on a nerve or to destroy a nerve that is causing pain. Physical therapy to help improve movement and balance. Devices to help you move around (assistive devices). Follow these instructions at home: Medicines Take over-the-counter and prescription medicines only as told by your health care provider. Do not take any other medicines without first asking your health care provider. Ask your health care provider if the medicine prescribed to you requires you to avoid driving or using machinery. Lifestyle  Do not use any products that contain nicotine or tobacco. These products include cigarettes, chewing tobacco, and vaping devices, such as e-cigarettes. Smoking keeps   blood from reaching damaged nerves. If you  need help quitting, ask your health care provider. Avoid or limit alcohol. Too much alcohol can cause a vitamin B deficiency, and vitamin B is needed for healthy nerves. Eat a healthy diet. This includes: Eating foods that are high in fiber, such as beans, whole grains, and fresh fruits and vegetables. Limiting foods that are high in fat and processed sugars, such as fried or sweet foods. General instructions  If you have diabetes, work closely with your health care provider to keep your blood sugar under control. If you have numbness in your feet: Check every day for signs of injury or infection. Watch for redness, warmth, and swelling. Wear padded socks and comfortable shoes. These help protect your feet. Develop a good support system. Living with peripheral neuropathy can be stressful. Consider talking with a mental health specialist or joining a support group. Use assistive devices and attend physical therapy as told by your health care provider. This may include using a walker or a cane. Keep all follow-up visits. This is important. Where to find more information National Institute of Neurological Disorders: www.ninds.nih.gov Contact a health care provider if: You have new signs or symptoms of peripheral neuropathy. You are struggling emotionally from dealing with peripheral neuropathy. Your pain is not well controlled. Get help right away if: You have an injury or infection that is not healing normally. You develop new weakness in an arm or leg. You have fallen or do so frequently. Summary Peripheral neuropathy is when the nerves in the arms or legs are damaged, resulting in numbness, weakness, or pain. There are many causes of peripheral neuropathy, including diabetes, pinched nerves, vitamin deficiencies, autoimmune disease, and hereditary conditions. Diagnosing and finding the cause of peripheral neuropathy can be difficult. Your health care provider will take your medical  history, do a physical exam, and do tests, including blood tests and nerve function tests. Treatment involves treating the underlying cause of the neuropathy and taking medicines to help control pain. Physical therapy and assistive devices may also help. This information is not intended to replace advice given to you by your health care provider. Make sure you discuss any questions you have with your health care provider. Document Revised: 08/22/2021 Document Reviewed: 08/22/2021 Elsevier Patient Education  2023 [employer]  

## 2022-08-14 NOTE — Progress Notes (Signed)
Subjective:   By signing my name below, I, Kellie Simmering, attest that this documentation has been prepared under the direction and in the presence of Mosie Lukes, MD 08/14/2022.   Patient ID: Dana Burns, female    DOB: [birth year], 88 y.o.   MRN: [id number]  No chief complaint on file.  HPI Patient is in today for an office visit. The patient's daughter is also speaking on the patient's behalf.  Leg pain: She complains of bilateral leg pain. She states that she has received two injections that failed to manage the pain. She says that the pain starts in her toes and radiates upwards. She suspects that her bones are causing the pain and is requesting an x-ray. Her daughter has purchased neuropathy oil to help manage the pain. She is currently taking Diazepam 5 mg to manage her pain.   Foot pain: She complains that her feet burn at night and is causing her pain. She states that the skin on her heels is thin.  Diet: She expresses that she has been eating normal amounts of food, though her diet is not necessarily healthy. She states that she eats bananas often. She is currently taking Vitamin B.  Kidney: She states that a cyst was discovered on her kidney from an MRI.  Refills: She is requesting a refill on her Tizanidine 4 mg, Venlafaxine XR 37.5 mg and Hydrocodone.   Past Medical History:  Diagnosis Date   Abdominal pain 2017/10/26   Allergy    sneeze, rhinorrhea in am   Anxiety state 09/03/2013   Breast mass, right 10/14/2012   Chicken pox as a child   Chronic pain syndrome 01/23/2015   Chronic renal insufficiency 03/17/2011   Colon cancer ([place])    Complication of anesthesia    agitated when waking up, wakes up shaking, "like  I'm going into shock"   Constipation 10-26-2017   Depression    husband died  3 months ago, pt. admits depression  currently    Diabetic neuropathy ([place]) 03/17/2011   DM (diabetes mellitus) type 2, uncontrolled, with ketoacidosis ([place]) 03/17/2011    Ductal hyperplasia of breast 03/17/2011   Essential hypertension, benign 05/31/2010   Qualifier: Diagnosis of  By: Percival Spanish, MD, Bethann Goo 07/15/2021   Fibromyalgia    GERD (gastroesophageal reflux disease)    H/O echocardiogram    done /w [place] in 2011, saw Dr. Percival Spanish for tachycardia    Headache    History of colon cancer 03/17/2011   Hyperlipidemia    6 years   Hyperlipidemia, mixed    6 years   Hypertension    15 years, reports she has a rapid heartrate    Loss of weight 12/14/2013   Measles as a child   Osteoarthritis (arthritis due to wear and tear of joints) 03/17/2011   Noted diffusely including neck   Osteopenia 12/14/2013   Pain in finger of right hand 05/15/2017   Pain in joint, lower leg 09/03/2013   Pain of right shoulder region 07/07/2014   Peripheral neuropathy    SOB (shortness of breath) 05/31/2010   Qualifier: Diagnosis of  By: Percival Spanish, MD, Farrel Gordon     Stress 06/01/2013   Tachycardia 05/31/2010   Qualifier: Diagnosis of  By: Percival Spanish, MD, Farrel Gordon     Type 2 diabetes mellitus with peripheral neuropathy ([place]) 03/17/2011   Vitamin B 12 deficiency 07/07/2014   intrinsic factor positive   Past Surgical History:  Procedure Laterality  Date   BREAST EXCISIONAL BIOPSY Right 2013   benign   BREAST SURGERY  2013   right- benign   CATARACT EXTRACTION     /w IOL- bilateral    COLON SURGERY     For resection of cancer   Nodule removed     From throat   Family History  Problem Relation Age of Onset   Cancer Mother        Brain   Cancer Father        Colorectal   Cancer Sister        breast   Other Brother        pacemaker   Cancer Brother        pancreatic cancer   Arthritis Sister    Emphysema Brother    Cancer Brother    COPD Brother    Neuropathy Daughter    Fibromyalgia Daughter    Cancer Brother        metastatic colon cancer   Heart disease Neg Hx        Early   Social History   Socioeconomic History   Marital status: Widowed     Spouse name: Not on file   Number of children: 1   Years of education: 9   Highest education level: Not on file  Occupational History   Occupation: Retired  Tobacco Use   Smoking status: Never   Smokeless tobacco: Never  Substance and Sexual Activity   Alcohol use: No   Drug use: No   Sexual activity: Never    Comment: lives with daughter. no dietary restrictions  Other Topics Concern   Not on file  Social History Narrative   Patient lives at home with daughter.    Patient is retired.    Patient is widowed.    Patient has 1 child.    Patient has a 9th grade education.    Social Determinants of Health   Financial Resource Strain: Low Risk  (03/01/2022)   Overall Financial Resource Strain (CARDIA)    Difficulty of Paying Living Expenses: Not hard at all  Food Insecurity: No Food Insecurity (03/01/2022)   Hunger Vital Sign    Worried About Running Out of Food in the Last Year: Never true    Ran Out of Food in the Last Year: Never true  Transportation Needs: No Transportation Needs (03/01/2022)   PRAPARE - Hydrologist (Medical): No    Lack of Transportation (Non-Medical): No  Physical Activity: Inactive (03/01/2022)   Exercise Vital Sign    Days of Exercise per Week: 0 days    Minutes of Exercise per Session: 0 min  Stress: No Stress Concern Present (03/01/2022)   [place]    Feeling of Stress : Not at all  Social Connections: Not on file  Intimate Partner Violence: Not At Risk (03/01/2022)   Humiliation, Afraid, Rape, and Kick questionnaire    Fear of Current or Ex-Partner: No    Emotionally Abused: No    Physically Abused: No    Sexually Abused: No   Outpatient Medications Prior to Visit  Medication Sig Dispense Refill   acetaminophen (TYLENOL) 500 MG tablet Take 500 mg by mouth every 6 (six) hours as needed for pain.     calcium carbonate (OS-CAL) 600 MG TABS Take 600 mg by  mouth 2 (two) times daily with a meal.     Cholecalciferol (VITAMIN D3) 50 MCG (2000  UT) capsule Take 2,000 Units by mouth daily.      citalopram (CELEXA) 20 MG tablet TAKE 1 TABLET BY MOUTH EVERY DAY 90 tablet 1   Cyanocobalamin (B-12 IJ) Place 1,000 mcg under the tongue daily.     diazepam (VALIUM) 5 MG tablet TAKE 1/2 TO 1 TABLET DAILY AS NEEDED FOR ANXIETY 30 tablet 1   gabapentin (NEURONTIN) 800 MG tablet TAKE 1 TABLET BY MOUTH THREE TIMES A DAY 270 tablet 1   pantoprazole (PROTONIX) 40 MG tablet Take 1 tablet (40 mg total) by mouth daily. 90 tablet 1   rOPINIRole (REQUIP) 0.5 MG tablet TAKE 1 TABLET BY MOUTH EVERYDAY AT BEDTIME 90 tablet 1   tiZANidine (ZANAFLEX) 4 MG tablet Take 1 tablet (4 mg total) by mouth 2 (two) times daily as needed for muscle spasms. 180 tablet 1   venlafaxine XR (EFFEXOR-XR) 37.5 MG 24 hr capsule TAKE 1 CAPSULE BY MOUTH DAILY WITH BREAKFAST. 90 capsule 2   No facility-administered medications prior to visit.   Allergies  Allergen Reactions   Tape Itching and Rash   Actos [Pioglitazone Hydrochloride] Other (See Comments)    Unknown   Buspar [Buspirone Hcl] Other (See Comments)    Unknown   Lipitor [Atorvastatin Calcium] Other (See Comments)    "It made me hurt all over."   Penicillins Rash   ROS     Objective:    Physical Exam Constitutional:      General: She is not in acute distress.    Appearance: Normal appearance. She is not ill-appearing.  HENT:     Head: Normocephalic and atraumatic.     Right Ear: External ear normal.     Left Ear: External ear normal.     Mouth/Throat:     Mouth: Mucous membranes are moist.     Pharynx: Oropharynx is clear.  Eyes:     Extraocular Movements: Extraocular movements intact.     Pupils: Pupils are equal, round, and reactive to light.  Cardiovascular:     Rate and Rhythm: Normal rate and regular rhythm.     Pulses: Normal pulses.     Heart sounds: Normal heart sounds. No murmur heard.    No gallop.   Pulmonary:     Effort: No respiratory distress.     Breath sounds: No wheezing or rales.  Abdominal:     General: Bowel sounds are normal.  Skin:    General: Skin is warm and dry.  Neurological:     Mental Status: She is alert and oriented to person, place, and time.  Psychiatric:        Mood and Affect: Mood normal.        Behavior: Behavior normal.        Judgment: Judgment normal.    There were no vitals taken for this visit. Wt Readings from Last 3 Encounters:  06/20/22 139 lb 2 oz (63.1 kg)  04/30/22 137 lb 3.2 oz (62.2 kg)  04/03/22 135 lb (61.2 kg)   Diabetic Foot Exam - Simple   No data filed    Lab Results  Component Value Date   WBC 4.9 04/30/2022   HGB 12.4 04/30/2022   HCT 38.1 04/30/2022   PLT 246.0 04/30/2022   GLUCOSE 98 05/29/2022   CHOL 277 (H) 04/30/2022   TRIG 219.0 (H) 04/30/2022   HDL 42.90 04/30/2022   LDLDIRECT 202.0 04/30/2022   LDLCALC 154 (H) 10/18/2021   ALT 11 05/22/2022   AST 15 05/22/2022   NA  140 05/29/2022   K 5.0 05/29/2022   CL 103 05/29/2022   CREATININE 1.42 (H) 05/29/2022   BUN 33 (H) 05/29/2022   CO2 30 05/29/2022   TSH 2.56 04/30/2022   HGBA1C 6.5 04/30/2022   MICROALBUR 1.0 03/28/2021   Lab Results  Component Value Date   TSH 2.56 04/30/2022   Lab Results  Component Value Date   WBC 4.9 04/30/2022   HGB 12.4 04/30/2022   HCT 38.1 04/30/2022   MCV 84.0 04/30/2022   PLT 246.0 04/30/2022   Lab Results  Component Value Date   NA 140 05/29/2022   K 5.0 05/29/2022   CO2 30 05/29/2022   GLUCOSE 98 05/29/2022   BUN 33 (H) 05/29/2022   CREATININE 1.42 (H) 05/29/2022   BILITOT 0.3 05/22/2022   ALKPHOS 71 05/22/2022   AST 15 05/22/2022   ALT 11 05/22/2022   PROT 6.8 05/22/2022   ALBUMIN 4.2 05/22/2022   CALCIUM 9.7 05/29/2022   GFR 31.73 (L) 05/29/2022   Lab Results  Component Value Date   CHOL 277 (H) 04/30/2022   Lab Results  Component Value Date   HDL 42.90 04/30/2022   Lab Results  Component  Value Date   LDLCALC 154 (H) 10/18/2021   Lab Results  Component Value Date   TRIG 219.0 (H) 04/30/2022   Lab Results  Component Value Date   CHOLHDL 6 04/30/2022   Lab Results  Component Value Date   HGBA1C 6.5 04/30/2022      Assessment & Plan:   Problem List Items Addressed This Visit   None  No orders of the defined types were placed in this encounter.  I, Kellie Simmering, personally preformed the services described in this documentation.  All medical record entries made by the scribe were at my direction and in my presence.  I have reviewed the chart and discharge instructions (if applicable) and agree that the record reflects my personal performance and is accurate and complete. 08/14/2022.  I,Mohammed Iqbal,acting as a scribe for Penni Homans, MD.,have documented all relevant documentation on the behalf of Penni Homans, MD,as directed by  Penni Homans, MD while in the presence of Penni Homans, MD.  Kellie Simmering

## 2022-08-15 ENCOUNTER — Telehealth: Payer: Self-pay

## 2022-08-15 LAB — HEMOGLOBIN A1C: Hgb A1c MFr Bld: 6.9 % — ABNORMAL HIGH (ref 4.6–6.5)

## 2022-08-15 LAB — COMPREHENSIVE METABOLIC PANEL
ALT: 9 U/L (ref 0–35)
AST: 15 U/L (ref 0–37)
Albumin: 4.3 g/dL (ref 3.5–5.2)
Alkaline Phosphatase: 61 U/L (ref 39–117)
BUN: 25 mg/dL — ABNORMAL HIGH (ref 6–23)
CO2: 31 mEq/L (ref 19–32)
Calcium: 9.6 mg/dL (ref 8.4–10.5)
Chloride: 101 mEq/L (ref 96–112)
Creatinine, Ser: 1.29 mg/dL — ABNORMAL HIGH (ref 0.40–1.20)
GFR: 35.56 mL/min — ABNORMAL LOW (ref >60.00)
Glucose, Bld: 98 mg/dL (ref 70–99)
Potassium: 5.2 mEq/L — ABNORMAL HIGH (ref 3.5–5.1)
Sodium: 139 mEq/L (ref 135–145)
Total Bilirubin: 0.3 mg/dL (ref 0.2–1.2)
Total Protein: 6.8 g/dL (ref 6.0–8.3)

## 2022-08-15 LAB — CBC
HCT: 39.1 % (ref 36.0–46.0)
Hemoglobin: 12.6 g/dL (ref 12.0–15.0)
MCHC: 32.2 g/dL (ref 30.0–36.0)
MCV: 85.7 fl (ref 78.0–100.0)
Platelets: 230 10*3/uL (ref 150.0–400.0)
RBC: 4.56 Mil/uL (ref 3.87–5.11)
RDW: 16 % — ABNORMAL HIGH (ref 11.5–15.5)
WBC: 4.6 10*3/uL (ref 4.0–10.5)

## 2022-08-15 LAB — LDL CHOLESTEROL, DIRECT: Direct LDL: 183 mg/dL

## 2022-08-15 LAB — LIPID PANEL
Cholesterol: 272 mg/dL — ABNORMAL HIGH (ref 0–200)
HDL: 41 mg/dL (ref >39.00)
NonHDL: 231.35
Total CHOL/HDL Ratio: 7
Triglycerides: 236 mg/dL — ABNORMAL HIGH (ref 0.0–149.0)
VLDL: 47.2 mg/dL — ABNORMAL HIGH (ref 0.0–40.0)

## 2022-08-15 LAB — VITAMIN B12: Vitamin B-12: 214 pg/mL (ref 211–911)

## 2022-08-15 LAB — TSH: TSH: 2.96 u[IU]/mL (ref 0.35–5.50)

## 2022-08-15 NOTE — Assessment & Plan Note (Signed)
Well controlled, no changes to meds. Encouraged heart healthy diet such as the DASH diet and exercise as tolerated.  °

## 2022-08-15 NOTE — Assessment & Plan Note (Signed)
Supplement and monitor 

## 2022-08-15 NOTE — Assessment & Plan Note (Signed)
hgba1c acceptable, minimize simple carbs. Increase exercise as tolerated. Continue current meds 

## 2022-08-15 NOTE — Assessment & Plan Note (Addendum)
Hydrate and monitor. Follows with [facility], Dr Pearson Grippe

## 2022-08-15 NOTE — Telephone Encounter (Signed)
error 

## 2022-08-15 NOTE — Assessment & Plan Note (Signed)
She takes just 2.5 mg of Diazepam each morning. She feels it helps and she does not need other doses. She is advised she can only take one am dose each day if she is going to take Hydrocodone qhs.

## 2022-08-15 NOTE — Assessment & Plan Note (Signed)
She struggles with daily pain and struggles with sleep due to the pain, she is going to try some new neuropathy lotion and she is allowed a new prescription for one Hydrocodone at bedtime, she has taken in the past and understands the risks.

## 2022-08-15 NOTE — Telephone Encounter (Signed)
Pt daughter called back and was advised  Nurse Visit was made for 1st  B-12 2 of Weekly shot

## 2022-08-16 LAB — INTRINSIC FACTOR ANTIBODIES: Intrinsic Factor: NEGATIVE

## 2022-08-22 ENCOUNTER — Ambulatory Visit (INDEPENDENT_AMBULATORY_CARE_PROVIDER_SITE_OTHER): Payer: Medicare Other

## 2022-08-22 DIAGNOSIS — E538 Deficiency of other specified B group vitamins: Secondary | ICD-10-CM | POA: Diagnosis not present

## 2022-08-22 MED ORDER — CYANOCOBALAMIN 1000 MCG/ML IJ SOLN
1000.0000 ug | Freq: Once | INTRAMUSCULAR | Status: AC
  Administered 2022-08-22: 1000 ug via INTRAMUSCULAR

## 2022-08-22 NOTE — Progress Notes (Signed)
Pt here for bi-weekly B12 injection per Dr.Stacey Blyth  B12 1094mg given IM, left deltoid and pt tolerated injection well.  Next B12 injection scheduled for next week

## 2022-09-05 ENCOUNTER — Ambulatory Visit (INDEPENDENT_AMBULATORY_CARE_PROVIDER_SITE_OTHER): Payer: Medicare Other

## 2022-09-05 DIAGNOSIS — E538 Deficiency of other specified B group vitamins: Secondary | ICD-10-CM | POA: Diagnosis not present

## 2022-09-05 MED ORDER — CYANOCOBALAMIN 1000 MCG/ML IJ SOLN
1000.0000 ug | Freq: Once | INTRAMUSCULAR | Status: AC
  Administered 2022-09-05: 1000 ug via INTRAMUSCULAR

## 2022-09-05 NOTE — Progress Notes (Signed)
Pt here for bi-weekly B12 injection per Dr.Stacey Blyth  B12 1029mg given IM, left deltoid and pt tolerated injection well.  Next B12 injection scheduled for next week

## 2022-09-17 ENCOUNTER — Telehealth: Payer: Self-pay | Admitting: Family Medicine

## 2022-09-17 NOTE — Telephone Encounter (Signed)
Patient's daughter called to let Dr. Charlett Blake know that the patient would like a referral to [facility] health Rheumatology [phone], because she believes they will be able to help her with her leg pain. Please advise.

## 2022-09-19 ENCOUNTER — Other Ambulatory Visit: Payer: Self-pay

## 2022-09-19 DIAGNOSIS — E782 Mixed hyperlipidemia: Secondary | ICD-10-CM

## 2022-09-19 DIAGNOSIS — M2550 Pain in unspecified joint: Secondary | ICD-10-CM

## 2022-09-19 DIAGNOSIS — E1142 Type 2 diabetes mellitus with diabetic polyneuropathy: Secondary | ICD-10-CM

## 2022-09-19 DIAGNOSIS — I10 Essential (primary) hypertension: Secondary | ICD-10-CM

## 2022-09-19 NOTE — Telephone Encounter (Signed)
Called pt Lvm to call our office back  The lab order are in just need to make lab appt

## 2022-09-19 NOTE — Telephone Encounter (Signed)
Pt has lab appt

## 2022-09-19 NOTE — Telephone Encounter (Signed)
Spoke to patient's daughter and patient and appt was made for 09/22.

## 2022-09-21 ENCOUNTER — Other Ambulatory Visit (INDEPENDENT_AMBULATORY_CARE_PROVIDER_SITE_OTHER): Payer: Medicare Other

## 2022-09-21 DIAGNOSIS — E782 Mixed hyperlipidemia: Secondary | ICD-10-CM

## 2022-09-21 DIAGNOSIS — M2550 Pain in unspecified joint: Secondary | ICD-10-CM | POA: Diagnosis not present

## 2022-09-21 DIAGNOSIS — I10 Essential (primary) hypertension: Secondary | ICD-10-CM

## 2022-09-21 LAB — COMPREHENSIVE METABOLIC PANEL
ALT: 9 U/L (ref 0–35)
AST: 17 U/L (ref 0–37)
Albumin: 4.1 g/dL (ref 3.5–5.2)
Alkaline Phosphatase: 57 U/L (ref 39–117)
BUN: 27 mg/dL — ABNORMAL HIGH (ref 6–23)
CO2: 31 mEq/L (ref 19–32)
Calcium: 9.7 mg/dL (ref 8.4–10.5)
Chloride: 103 mEq/L (ref 96–112)
Creatinine, Ser: 1.37 mg/dL — ABNORMAL HIGH (ref 0.40–1.20)
GFR: 33.06 mL/min — ABNORMAL LOW (ref >60.00)
Glucose, Bld: 123 mg/dL — ABNORMAL HIGH (ref 70–99)
Potassium: 5.3 mEq/L — ABNORMAL HIGH (ref 3.5–5.1)
Sodium: 139 mEq/L (ref 135–145)
Total Bilirubin: 0.3 mg/dL (ref 0.2–1.2)
Total Protein: 7.2 g/dL (ref 6.0–8.3)

## 2022-09-21 LAB — CBC WITH DIFFERENTIAL/PLATELET
Basophils Absolute: 0 10*3/uL (ref 0.0–0.1)
Basophils Relative: 0.2 % (ref 0.0–3.0)
Eosinophils Absolute: 0.1 10*3/uL (ref 0.0–0.7)
Eosinophils Relative: 1.5 % (ref 0.0–5.0)
HCT: 39.3 % (ref 36.0–46.0)
Hemoglobin: 12.7 g/dL (ref 12.0–15.0)
Lymphocytes Relative: 19.1 % (ref 12.0–46.0)
Lymphs Abs: 0.8 10*3/uL (ref 0.7–4.0)
MCHC: 32.2 g/dL (ref 30.0–36.0)
MCV: 86.9 fl (ref 78.0–100.0)
Monocytes Absolute: 0.5 10*3/uL (ref 0.1–1.0)
Monocytes Relative: 11.7 % (ref 3.0–12.0)
Neutro Abs: 2.9 10*3/uL (ref 1.4–7.7)
Neutrophils Relative %: 67.5 % (ref 43.0–77.0)
Platelets: 233 10*3/uL (ref 150.0–400.0)
RBC: 4.52 Mil/uL (ref 3.87–5.11)
RDW: 15.9 % — ABNORMAL HIGH (ref 11.5–15.5)
WBC: 4.4 10*3/uL (ref 4.0–10.5)

## 2022-09-21 LAB — C-REACTIVE PROTEIN: CRP: <1 mg/dL (ref 0.5–20.0)

## 2022-09-21 LAB — MAGNESIUM: Magnesium: 2.3 mg/dL (ref 1.5–2.5)

## 2022-09-21 LAB — SEDIMENTATION RATE: Sed Rate: 11 mm/hr (ref 0–30)

## 2022-09-22 LAB — RHEUMATOID FACTOR: Rheumatoid fact SerPl-aCnc: <14 IU/mL (ref <14)

## 2022-09-22 LAB — ANA: Anti Nuclear Antibody (ANA): NEGATIVE

## 2022-09-25 ENCOUNTER — Telehealth: Payer: Self-pay | Admitting: Family Medicine

## 2022-09-25 NOTE — Telephone Encounter (Signed)
Pt's daughter called to go over results from 9/22.

## 2022-09-25 NOTE — Telephone Encounter (Signed)
Called pt daughter back Lvm was returning her call About labs.

## 2022-09-26 ENCOUNTER — Other Ambulatory Visit: Payer: Self-pay | Admitting: Family Medicine

## 2022-09-26 DIAGNOSIS — M79644 Pain in right finger(s): Secondary | ICD-10-CM

## 2022-09-26 DIAGNOSIS — M1990 Unspecified osteoarthritis, unspecified site: Secondary | ICD-10-CM

## 2022-09-26 DIAGNOSIS — M25569 Pain in unspecified knee: Secondary | ICD-10-CM

## 2022-09-26 DIAGNOSIS — M79671 Pain in right foot: Secondary | ICD-10-CM

## 2022-09-26 NOTE — Telephone Encounter (Signed)
Called pt daughter and she stated yes will like  To move forward with the referral

## 2022-09-27 ENCOUNTER — Other Ambulatory Visit: Payer: Self-pay | Admitting: *Deleted

## 2022-09-27 DIAGNOSIS — E875 Hyperkalemia: Secondary | ICD-10-CM

## 2022-09-28 ENCOUNTER — Other Ambulatory Visit: Payer: Medicare Other

## 2022-10-04 ENCOUNTER — Telehealth: Payer: Self-pay | Admitting: Family Medicine

## 2022-10-04 NOTE — Telephone Encounter (Signed)
Pt's daughter states Rheumatology referral could not see her until April, but [facility] could see her a lot sooner and they would like it sent there.   [facility]  [address] [place], [place], [place] [address]  [phone]

## 2022-10-05 ENCOUNTER — Ambulatory Visit: Payer: Medicare Other | Admitting: *Deleted

## 2022-10-05 ENCOUNTER — Other Ambulatory Visit (INDEPENDENT_AMBULATORY_CARE_PROVIDER_SITE_OTHER): Payer: Medicare Other

## 2022-10-05 DIAGNOSIS — E875 Hyperkalemia: Secondary | ICD-10-CM | POA: Diagnosis not present

## 2022-10-05 LAB — COMPREHENSIVE METABOLIC PANEL
ALT: 9 U/L (ref 0–35)
AST: 15 U/L (ref 0–37)
Albumin: 4 g/dL (ref 3.5–5.2)
Alkaline Phosphatase: 60 U/L (ref 39–117)
BUN: 20 mg/dL (ref 6–23)
CO2: 31 mEq/L (ref 19–32)
Calcium: 9.3 mg/dL (ref 8.4–10.5)
Chloride: 104 mEq/L (ref 96–112)
Creatinine, Ser: 1.3 mg/dL — ABNORMAL HIGH (ref 0.40–1.20)
GFR: 35.2 mL/min — ABNORMAL LOW (ref >60.00)
Glucose, Bld: 102 mg/dL — ABNORMAL HIGH (ref 70–99)
Potassium: 4.9 mEq/L (ref 3.5–5.1)
Sodium: 139 mEq/L (ref 135–145)
Total Bilirubin: 0.2 mg/dL (ref 0.2–1.2)
Total Protein: 6.4 g/dL (ref 6.0–8.3)

## 2022-10-05 NOTE — Telephone Encounter (Signed)
Pt's daughter came in the office today for a status update on referral. Please advise once new referral has been placed.

## 2022-10-05 NOTE — Telephone Encounter (Signed)
Called pt daughter Lvm to call our office back

## 2022-10-05 NOTE — Progress Notes (Signed)
Pt here for monthly B12 injection per Charlett Blake.  Order: 08/14/22 1000 mcg [place]/I'm every 2 weeks x 2 doses then 1 dose a month  B12 1021mg given IM, left deltoid and pt tolerated injection well.  Patient will get at next visit on 11/15/22 with Dr. BCharlett Blake

## 2022-10-05 NOTE — Telephone Encounter (Signed)
Also called earlier and lvm to call back.

## 2022-10-09 NOTE — Telephone Encounter (Signed)
Called pt was advised  

## 2022-10-10 DIAGNOSIS — G894 Chronic pain syndrome: Secondary | ICD-10-CM | POA: Diagnosis not present

## 2022-10-10 DIAGNOSIS — M797 Fibromyalgia: Secondary | ICD-10-CM | POA: Diagnosis not present

## 2022-10-10 DIAGNOSIS — M5432 Sciatica, left side: Secondary | ICD-10-CM | POA: Diagnosis not present

## 2022-10-10 DIAGNOSIS — Z6822 Body mass index (BMI) 22.0-22.9, adult: Secondary | ICD-10-CM | POA: Diagnosis not present

## 2022-10-10 DIAGNOSIS — R21 Rash and other nonspecific skin eruption: Secondary | ICD-10-CM | POA: Diagnosis not present

## 2022-10-10 DIAGNOSIS — M1991 Primary osteoarthritis, unspecified site: Secondary | ICD-10-CM | POA: Diagnosis not present

## 2022-10-11 ENCOUNTER — Other Ambulatory Visit: Payer: Self-pay | Admitting: Family Medicine

## 2022-10-17 ENCOUNTER — Telehealth: Payer: Self-pay | Admitting: Family Medicine

## 2022-10-17 ENCOUNTER — Other Ambulatory Visit: Payer: Self-pay | Admitting: Family Medicine

## 2022-10-17 DIAGNOSIS — M25511 Pain in right shoulder: Secondary | ICD-10-CM

## 2022-10-17 DIAGNOSIS — M25569 Pain in unspecified knee: Secondary | ICD-10-CM

## 2022-10-17 NOTE — Telephone Encounter (Signed)
Dana Burns (daughter DPR OK) called stating that the pt was recommended to get a referral for pain management from her visit with [facility]. Dana Burns stated that if we do refer her out for pain management to refrain from referring to [name], MD due to issues that she herself had encountered.

## 2022-10-18 NOTE — Telephone Encounter (Signed)
Called pt lvm referral was put in

## 2022-11-06 ENCOUNTER — Ambulatory Visit: Payer: Medicare Other | Admitting: Family Medicine

## 2022-11-15 ENCOUNTER — Telehealth: Payer: Self-pay | Admitting: Family Medicine

## 2022-11-15 ENCOUNTER — Ambulatory Visit: Payer: Medicare Other | Admitting: Family Medicine

## 2022-11-15 DIAGNOSIS — M25569 Pain in unspecified knee: Secondary | ICD-10-CM

## 2022-11-15 NOTE — Telephone Encounter (Signed)
Referral placed again , comment section updated

## 2022-11-15 NOTE — Telephone Encounter (Signed)
Daughter called to advise that patient needs to be sent to another pain doctor. It was denied because daughter said she doesn't want her mom to see Dr. Letta Pate at [facility].

## 2022-11-20 ENCOUNTER — Ambulatory Visit (INDEPENDENT_AMBULATORY_CARE_PROVIDER_SITE_OTHER): Payer: Medicare Other | Admitting: Family Medicine

## 2022-11-20 ENCOUNTER — Other Ambulatory Visit: Payer: Self-pay

## 2022-11-20 VITALS — BP 128/70 | HR 82 | Temp 98.0°F | Resp 16 | Ht 66.0 in | Wt 140.0 lb

## 2022-11-20 DIAGNOSIS — M25551 Pain in right hip: Secondary | ICD-10-CM | POA: Diagnosis not present

## 2022-11-20 DIAGNOSIS — I10 Essential (primary) hypertension: Secondary | ICD-10-CM | POA: Diagnosis not present

## 2022-11-20 DIAGNOSIS — Z23 Encounter for immunization: Secondary | ICD-10-CM

## 2022-11-20 DIAGNOSIS — E1149 Type 2 diabetes mellitus with other diabetic neurological complication: Secondary | ICD-10-CM | POA: Diagnosis not present

## 2022-11-20 DIAGNOSIS — M25561 Pain in right knee: Secondary | ICD-10-CM | POA: Diagnosis not present

## 2022-11-20 DIAGNOSIS — N289 Disorder of kidney and ureter, unspecified: Secondary | ICD-10-CM

## 2022-11-20 DIAGNOSIS — E538 Deficiency of other specified B group vitamins: Secondary | ICD-10-CM

## 2022-11-20 DIAGNOSIS — G894 Chronic pain syndrome: Secondary | ICD-10-CM | POA: Diagnosis not present

## 2022-11-20 DIAGNOSIS — E1142 Type 2 diabetes mellitus with diabetic polyneuropathy: Secondary | ICD-10-CM | POA: Diagnosis not present

## 2022-11-20 DIAGNOSIS — F419 Anxiety disorder, unspecified: Secondary | ICD-10-CM

## 2022-11-20 DIAGNOSIS — G2581 Restless legs syndrome: Secondary | ICD-10-CM | POA: Diagnosis not present

## 2022-11-20 DIAGNOSIS — K219 Gastro-esophageal reflux disease without esophagitis: Secondary | ICD-10-CM | POA: Diagnosis not present

## 2022-11-20 DIAGNOSIS — N189 Chronic kidney disease, unspecified: Secondary | ICD-10-CM | POA: Diagnosis not present

## 2022-11-20 MED ORDER — CYANOCOBALAMIN 1000 MCG/ML IJ SOLN
1000.0000 ug | Freq: Once | INTRAMUSCULAR | Status: AC
  Administered 2022-11-20: 1000 ug via INTRAMUSCULAR

## 2022-11-20 MED ORDER — HYDROCODONE-ACETAMINOPHEN 5-325 MG PO TABS: 1.0000 | ORAL_TABLET | Freq: Three times a day (TID) | ORAL | 0 refills | Status: DC | PRN

## 2022-11-20 MED ORDER — DIAZEPAM 5 MG PO TABS: ORAL_TABLET | ORAL | 1 refills | Status: DC

## 2022-11-20 MED ORDER — GABAPENTIN 800 MG PO TABS: 800.0000 mg | ORAL_TABLET | Freq: Three times a day (TID) | ORAL | 1 refills | Status: DC

## 2022-11-20 MED ORDER — FAMOTIDINE 40 MG PO TABS: 40.0000 mg | ORAL_TABLET | Freq: Every day | ORAL | 3 refills | Status: DC

## 2022-11-20 NOTE — Patient Instructions (Signed)
Restless Legs Syndrome Restless legs syndrome is a condition that causes uncomfortable feelings or sensations in the legs, especially while sitting or lying down. The sensations usually cause an overwhelming urge to move the legs. The arms can also sometimes be affected. The condition can range from mild to severe. The symptoms often interfere with a person's ability to sleep. What are the causes? The cause of this condition is not known. What increases the risk? The following factors may make you more likely to develop this condition: Being older than 50. Pregnancy. Being a woman. In general, the condition is more common in women than in men. A family history of the condition. Having iron deficiency. Overuse of caffeine, nicotine, or alcohol. Certain medical conditions, such as kidney disease, Parkinson's disease, or nerve damage. Certain medicines, such as those for high blood pressure, nausea, colds, allergies, depression, and some heart conditions. What are the signs or symptoms? The main symptom of this condition is uncomfortable sensations in the legs, such as: Pulling. Tingling. Prickling. Throbbing. Crawling. Burning. Usually, the sensations: Affect both sides of the body. Are worse when you sit or lie down. Are worse at night. These may make it difficult to fall asleep. Make you have a strong urge to move your legs. Are temporarily relieved by moving your legs or standing. The arms can also be affected, but this is rare. People who have this condition often have tiredness during the day because of their lack of sleep at night. How is this diagnosed? This condition may be diagnosed based on: Your symptoms. Blood tests. In some cases, you may be monitored in a sleep lab by a specialist (a sleep study). This can detect any disruptions in your sleep. How is this treated? This condition is treated by managing the symptoms. This may include: Lifestyle changes, such as  exercising, using relaxation techniques, and avoiding caffeine, alcohol, or tobacco. Iron supplements. Medicines. Parkinson's medications may be tried first. Anti-seizure medications can also be helpful. Follow these instructions at home: General instructions Take over-the-counter and prescription medicines only as told by your health care provider. Use methods to help relieve the uncomfortable sensations, such as: Massaging your legs. Walking or stretching. Taking a cold or hot bath. Keep all follow-up visits. This is important. Lifestyle     Practice good sleep habits. For example, go to bed and get up at the same time every day. Most adults should get 7-9 hours of sleep each night. Exercise regularly. Try to get at least 30 minutes of exercise most days of the week. Practice ways of relaxing, such as yoga or meditation. Avoid caffeine and alcohol. Do not use any products that contain nicotine or tobacco. These products include cigarettes, chewing tobacco, and vaping devices, such as e-cigarettes. If you need help quitting, ask your health care provider. Where to find more information National Institute of Neurological Disorders and Stroke: www.ninds.nih.gov Contact a health care provider if: Your symptoms get worse or they do not improve with treatment. Summary Restless legs syndrome is a condition that causes uncomfortable feelings or sensations in the legs, especially while sitting or lying down. The symptoms often interfere with your ability to sleep. This condition is treated by managing the symptoms. You may need to make lifestyle changes or take medicines. This information is not intended to replace advice given to you by your health care provider. Make sure you discuss any questions you have with your health care provider. Document Revised: 07/30/2021 Document Reviewed: 07/30/2021 Elsevier Patient Education    2023 [employer]  

## 2022-11-20 NOTE — Progress Notes (Signed)
Subjective:   By signing my name below, I, Kellie Simmering, attest that this documentation has been prepared under the direction and in the presence of Mosie Lukes, MD., 11/20/2022.     Patient ID: Dana Burns, female    DOB: 20-Aug-1926, 88 y.o.   MRN: [id number]  No chief complaint on file.  HPI Patient is in today for an office visit.  Her daughter is present and also speaking on her behalf. Patient denies CP/ palp/ SOB/ HA/ congestion/fevers/ GU c/o.  Anxiety She takes 0.5 Diazepam 5 mg daily for anxiety.  B12 Patient is requesting a vitamin B12 injection today.   Bowel Habits She takes Pantoprazole 40 mg and Milk of Magnesia to assist her bowel movements. She is interested in also taking Famotidine.  Restless Leg Syndrome/Fibromyalgia Patient is interested in increasing the dosage of her Ropinirole to manage her restless leg syndrome. She has been taking 0.5 mg for the past 2 years and has not had any side effects.  She takes 1 Hydrocodone 5-325 mg at night and reports that she is sleeping well. However, she experiences bilateral leg pain during the day. Pain in the right leg is worse than left. She state that the pain radiates from her hips to bilateral great toes. No falls or trauma.  She applies lidocaine every morning and night, but this provides no relief. She complains that both feet feel cold and there is a sensation of water trickling down both legs. She has not seen an orthopedist to manage her hip and knee pain but is interested in receiving a referral.  Immunizations Patient is requesting an Influenza immunization today. Immunization History  Administered Date(s) Administered   Fluad Quad(high Dose 65+) 10/12/2021   Influenza Whole 09/30/2010   Influenza, High Dose Seasonal PF 09/24/2017, 10/14/2018, 09/14/2019   Influenza,inj,Quad PF,6+ Mos 08/24/2014, 11/13/2016   Influenza-Unspecified 01/04/2014, 10/13/2015, 09/10/2020   PFIZER(Purple Top)SARS-COV-2  Vaccination 04/21/2020, 05/12/2020   Pneumococcal Conjugate-13 09/14/2014   Pneumococcal Polysaccharide-23 10/27/2015   Td 01/01/2004   Tdap 10/17/2011   Past Medical History:  Diagnosis Date   Abdominal pain October 14, 2017   Allergy    sneeze, rhinorrhea in am   Anxiety state 09/03/2013   Breast mass, right 10/14/2012   Chicken pox as a child   Chronic pain syndrome 01/23/2015   Chronic renal insufficiency 03/17/2011   Colon cancer ([place])    Complication of anesthesia    agitated when waking up, wakes up shaking, "like  I'm going into shock"   Constipation October 14, 2017   Depression    husband died  3 months ago, pt. admits depression  currently    Diabetic neuropathy ([place]) 03/17/2011   DM (diabetes mellitus) type 2, uncontrolled, with ketoacidosis ([place]) 03/17/2011   Ductal hyperplasia of breast 03/17/2011   Essential hypertension, benign 05/31/2010   Qualifier: Diagnosis of  By: Percival Spanish, MD, Bethann Goo 07/15/2021   Fibromyalgia    GERD (gastroesophageal reflux disease)    H/O echocardiogram    done /w [place] in 2011, saw Dr. Percival Spanish for tachycardia    Headache    History of colon cancer 03/17/2011   Hyperlipidemia    6 years   Hyperlipidemia, mixed    6 years   Hypertension    15 years, reports she has a rapid heartrate    Loss of weight 12/14/2013   Measles as a child   Osteoarthritis (arthritis due to wear and tear of joints) 03/17/2011   Noted  diffusely including neck   Osteopenia 12/14/2013   Pain in finger of right hand 05/15/2017   Pain in joint, lower leg 09/03/2013   Pain of right shoulder region 07/07/2014   Peripheral neuropathy    SOB (shortness of breath) 05/31/2010   Qualifier: Diagnosis of  By: Percival Spanish, MD, Farrel Gordon     Stress 06/01/2013   Tachycardia 05/31/2010   Qualifier: Diagnosis of  By: Percival Spanish, MD, Farrel Gordon     Type 2 diabetes mellitus with peripheral neuropathy ([place]) 03/17/2011   Vitamin B 12 deficiency 07/07/2014   intrinsic factor positive    Past Surgical History:  Procedure Laterality Date   BREAST EXCISIONAL BIOPSY Right 2013   benign   BREAST SURGERY  2013   right- benign   CATARACT EXTRACTION     /w IOL- bilateral    COLON SURGERY     For resection of cancer   Nodule removed     From throat   Family History  Problem Relation Age of Onset   Cancer Mother        Brain   Cancer Father        Colorectal   Cancer Sister        breast   Other Brother        pacemaker   Cancer Brother        pancreatic cancer   Arthritis Sister    Emphysema Brother    Cancer Brother    COPD Brother    Neuropathy Daughter    Fibromyalgia Daughter    Cancer Brother        metastatic colon cancer   Heart disease Neg Hx        Early   Social History   Socioeconomic History   Marital status: Widowed    Spouse name: Not on file   Number of children: 1   Years of education: 9   Highest education level: Not on file  Occupational History   Occupation: Retired  Tobacco Use   Smoking status: Never   Smokeless tobacco: Never  Substance and Sexual Activity   Alcohol use: No   Drug use: No   Sexual activity: Never    Comment: lives with daughter. no dietary restrictions  Other Topics Concern   Not on file  Social History Narrative   Patient lives at home with daughter.    Patient is retired.    Patient is widowed.    Patient has 1 child.    Patient has a 9th grade education.    Social Determinants of Health   Financial Resource Strain: Low Risk  (03/01/2022)   Overall Financial Resource Strain (CARDIA)    Difficulty of Paying Living Expenses: Not hard at all  Food Insecurity: No Food Insecurity (03/01/2022)   Hunger Vital Sign    Worried About Running Out of Food in the Last Year: Never true    Ran Out of Food in the Last Year: Never true  Transportation Needs: No Transportation Needs (03/01/2022)   PRAPARE - Hydrologist (Medical): No    Lack of Transportation (Non-Medical): No   Physical Activity: Inactive (03/01/2022)   Exercise Vital Sign    Days of Exercise per Week: 0 days    Minutes of Exercise per Session: 0 min  Stress: No Stress Concern Present (03/01/2022)   [place]    Feeling of Stress : Not at all  Social Connections: Not  on file  Intimate Partner Violence: Not At Risk (03/01/2022)   Humiliation, Afraid, Rape, and Kick questionnaire    Fear of Current or Ex-Partner: No    Emotionally Abused: No    Physically Abused: No    Sexually Abused: No   Outpatient Medications Prior to Visit  Medication Sig Dispense Refill   acetaminophen (TYLENOL) 500 MG tablet Take 500 mg by mouth every 6 (six) hours as needed for pain.     calcium carbonate (OS-CAL) 600 MG TABS Take 600 mg by mouth 2 (two) times daily with a meal.     Cholecalciferol (VITAMIN D3) 50 MCG (2000 UT) capsule Take 2,000 Units by mouth daily.      citalopram (CELEXA) 20 MG tablet TAKE 1 TABLET BY MOUTH EVERY DAY 90 tablet 1   Cyanocobalamin (B-12 IJ) Place 1,000 mcg under the tongue daily.     diazepam (VALIUM) 5 MG tablet TAKE 1/2 TO 1 TABLET DAILY AS NEEDED FOR ANXIETY 30 tablet 1   gabapentin (NEURONTIN) 800 MG tablet TAKE 1 TABLET BY MOUTH THREE TIMES A DAY 270 tablet 1   HYDROcodone-acetaminophen (NORCO) 5-325 MG tablet Take 1 tablet by mouth at bedtime as needed for moderate pain. 30 tablet 0   pantoprazole (PROTONIX) 40 MG tablet Take 1 tablet (40 mg total) by mouth daily. 90 tablet 1   rOPINIRole (REQUIP) 1 MG tablet Take 1 tablet (1 mg total) by mouth at bedtime. 30 tablet 3   tiZANidine (ZANAFLEX) 4 MG tablet Take 1 tablet (4 mg total) by mouth 2 (two) times daily as needed for muscle spasms. 180 tablet 1   venlafaxine XR (EFFEXOR-XR) 37.5 MG 24 hr capsule Take 1 capsule (37.5 mg total) by mouth daily with breakfast. 90 capsule 1   No facility-administered medications prior to visit.   Allergies  Allergen Reactions    Tape Itching and Rash   Actos [Pioglitazone Hydrochloride] Other (See Comments)    Unknown   Buspar [Buspirone Hcl] Other (See Comments)    Unknown   Lipitor [Atorvastatin Calcium] Other (See Comments)    "It made me hurt all over."   Penicillins Rash   Review of Systems  Constitutional:  Negative for chills and fever.  HENT:  Negative for congestion.   Respiratory:  Negative for shortness of breath.   Cardiovascular:  Negative for chest pain and palpitations.  Gastrointestinal:  Negative for abdominal pain, blood in stool, constipation, diarrhea, nausea and vomiting.  Genitourinary:  Negative for dysuria, frequency, hematuria and urgency.  Musculoskeletal:  Positive for joint pain (right hip/knee). Negative for falls.       (+) Bilateral leg pain, right worse than left.  Skin:           Neurological:  Negative for headaches.      Objective:    Physical Exam Exam conducted with a chaperone present.  Constitutional:      General: She is not in acute distress.    Appearance: Normal appearance. She is normal weight. She is not ill-appearing.  HENT:     Head: Normocephalic and atraumatic.     Right Ear: External ear normal.     Left Ear: External ear normal.     Nose: Nose normal.     Mouth/Throat:     Mouth: Mucous membranes are moist.     Pharynx: Oropharynx is clear.  Eyes:     General:        Right eye: No discharge.  Left eye: No discharge.     Extraocular Movements: Extraocular movements intact.     Pupils: Pupils are equal, round, and reactive to light.  Cardiovascular:     Rate and Rhythm: Normal rate and regular rhythm.     Pulses: Normal pulses.     Heart sounds: Normal heart sounds. No murmur heard.    No gallop.  Pulmonary:     Effort: Pulmonary effort is normal. No respiratory distress.     Breath sounds: Normal breath sounds. No wheezing or rales.  Abdominal:     General: Bowel sounds are normal.     Palpations: Abdomen is soft.     Tenderness:  There is no abdominal tenderness. There is no guarding.  Musculoskeletal:        General: Normal range of motion.     Cervical back: Normal range of motion.     Right lower leg: Edema present.     Left lower leg: Edema present.  Skin:    General: Skin is warm and dry.  Neurological:     Mental Status: She is alert and oriented to person, place, and time.  Psychiatric:        Mood and Affect: Mood normal.        Behavior: Behavior normal.        Judgment: Judgment normal.    There were no vitals taken for this visit. Wt Readings from Last 3 Encounters:  08/14/22 136 lb 9.6 oz (62 kg)  06/20/22 139 lb 2 oz (63.1 kg)  04/30/22 137 lb 3.2 oz (62.2 kg)   Diabetic Foot Exam - Simple   No data filed    Lab Results  Component Value Date   WBC 4.4 09/21/2022   HGB 12.7 09/21/2022   HCT 39.3 09/21/2022   PLT 233.0 09/21/2022   GLUCOSE 102 (H) 10/05/2022   CHOL 272 (H) 08/14/2022   TRIG 236.0 (H) 08/14/2022   HDL 41.00 08/14/2022   LDLDIRECT 183.0 08/14/2022   LDLCALC 154 (H) 10/18/2021   ALT 9 10/05/2022   AST 15 10/05/2022   NA 139 10/05/2022   K 4.9 10/05/2022   CL 104 10/05/2022   CREATININE 1.30 (H) 10/05/2022   BUN 20 10/05/2022   CO2 31 10/05/2022   TSH 2.96 08/14/2022   HGBA1C 6.9 (H) 08/14/2022   MICROALBUR 1.0 03/28/2021   Lab Results  Component Value Date   TSH 2.96 08/14/2022   Lab Results  Component Value Date   WBC 4.4 09/21/2022   HGB 12.7 09/21/2022   HCT 39.3 09/21/2022   MCV 86.9 09/21/2022   PLT 233.0 09/21/2022   Lab Results  Component Value Date   NA 139 10/05/2022   K 4.9 10/05/2022   CO2 31 10/05/2022   GLUCOSE 102 (H) 10/05/2022   BUN 20 10/05/2022   CREATININE 1.30 (H) 10/05/2022   BILITOT 0.2 10/05/2022   ALKPHOS 60 10/05/2022   AST 15 10/05/2022   ALT 9 10/05/2022   PROT 6.4 10/05/2022   ALBUMIN 4.0 10/05/2022   CALCIUM 9.3 10/05/2022   GFR 35.20 (L) 10/05/2022   Lab Results  Component Value Date   CHOL 272 (H)  08/14/2022   Lab Results  Component Value Date   HDL 41.00 08/14/2022   Lab Results  Component Value Date   LDLCALC 154 (H) 10/18/2021   Lab Results  Component Value Date   TRIG 236.0 (H) 08/14/2022   Lab Results  Component Value Date   CHOLHDL 7 08/14/2022  Lab Results  Component Value Date   HGBA1C 6.9 (H) 08/14/2022      Assessment & Plan:   B12 Patient will receive B12 injection today.  Blood Work Blood work today will check B12, HGA1C, cholesterol, TSH, and CMP.  GERD Prescribed Famotidine to manage GERD.  Pain Management Hydrocodone 5-325 mg increased to 2 tablets daily to manage leg pain.   Right Hip/Knee Pain Referral to orthopedist Dr. Rodell Perna to manage right hip/knee pain.  Ropinirole Ropinirole increased from 0.5 mg to 1 mg daily.   Refills Diazepam 5 mg and Gabapentin 800 mg refilled.  Problem List Items Addressed This Visit   None  No orders of the defined types were placed in this encounter.  I, Kellie Simmering, personally preformed the services described in this documentation.  All medical record entries made by the scribe were at my direction and in my presence.  I have reviewed the chart and discharge instructions (if applicable) and agree that the record reflects my personal performance and is accurate and complete. 11/20/2022  I,Mohammed Iqbal,acting as a scribe for Penni Homans, MD.,have documented all relevant documentation on the behalf of Penni Homans, MD,as directed by  Penni Homans, MD while in the presence of Penni Homans, MD.  Kellie Simmering

## 2022-11-21 ENCOUNTER — Other Ambulatory Visit: Payer: Self-pay

## 2022-11-21 DIAGNOSIS — E538 Deficiency of other specified B group vitamins: Secondary | ICD-10-CM

## 2022-11-21 DIAGNOSIS — G2581 Restless legs syndrome: Secondary | ICD-10-CM | POA: Insufficient documentation

## 2022-11-21 LAB — LDL CHOLESTEROL, DIRECT: Direct LDL: 168 mg/dL

## 2022-11-21 LAB — COMPREHENSIVE METABOLIC PANEL
ALT: 11 U/L (ref 0–35)
AST: 18 U/L (ref 0–37)
Albumin: 4.5 g/dL (ref 3.5–5.2)
Alkaline Phosphatase: 68 U/L (ref 39–117)
BUN: 27 mg/dL — ABNORMAL HIGH (ref 6–23)
CO2: 32 mEq/L (ref 19–32)
Calcium: 9.6 mg/dL (ref 8.4–10.5)
Chloride: 100 mEq/L (ref 96–112)
Creatinine, Ser: 1.21 mg/dL — ABNORMAL HIGH (ref 0.40–1.20)
GFR: 38.32 mL/min — ABNORMAL LOW (ref >60.00)
Glucose, Bld: 94 mg/dL (ref 70–99)
Potassium: 4.8 mEq/L (ref 3.5–5.1)
Sodium: 138 mEq/L (ref 135–145)
Total Bilirubin: 0.2 mg/dL (ref 0.2–1.2)
Total Protein: 7 g/dL (ref 6.0–8.3)

## 2022-11-21 LAB — VITAMIN B12: Vitamin B-12: >1500 pg/mL — ABNORMAL HIGH (ref 211–911)

## 2022-11-21 LAB — CBC
HCT: 39.8 % (ref 36.0–46.0)
Hemoglobin: 12.8 g/dL (ref 12.0–15.0)
MCHC: 32.3 g/dL (ref 30.0–36.0)
MCV: 87.1 fl (ref 78.0–100.0)
Platelets: 262 10*3/uL (ref 150.0–400.0)
RBC: 4.57 Mil/uL (ref 3.87–5.11)
RDW: 15.6 % — ABNORMAL HIGH (ref 11.5–15.5)
WBC: 5.8 10*3/uL (ref 4.0–10.5)

## 2022-11-21 LAB — LIPID PANEL
Cholesterol: 265 mg/dL — ABNORMAL HIGH (ref 0–200)
HDL: 42.8 mg/dL (ref >39.00)
NonHDL: 222.24
Total CHOL/HDL Ratio: 6
Triglycerides: 339 mg/dL — ABNORMAL HIGH (ref 0.0–149.0)
VLDL: 67.8 mg/dL — ABNORMAL HIGH (ref 0.0–40.0)

## 2022-11-21 LAB — HEMOGLOBIN A1C: Hgb A1c MFr Bld: 6.8 % — ABNORMAL HIGH (ref 4.6–6.5)

## 2022-11-21 LAB — TSH: TSH: 2.22 u[IU]/mL (ref 0.35–5.50)

## 2022-11-21 NOTE — Assessment & Plan Note (Signed)
Well controlled, no changes to meds. Encouraged heart healthy diet such as the DASH diet and exercise as tolerated.  °

## 2022-11-21 NOTE — Assessment & Plan Note (Signed)
Diffuse but especially in feet and entire right leg from hip down. Will refer to ortho to evaluate her hip and knee for consideration of her options secondary to her pain being so bad.

## 2022-11-21 NOTE — Assessment & Plan Note (Signed)
Will increase Requip from 0.5 mg dily to 1 mg and reevaluate

## 2022-11-21 NOTE — Assessment & Plan Note (Signed)
hgba1c acceptable, minimize simple carbs. Increase exercise as tolerated. Continue current meds 

## 2022-11-21 NOTE — Assessment & Plan Note (Signed)
Given shot today

## 2022-11-21 NOTE — Assessment & Plan Note (Signed)
Hydrate and monitor 

## 2022-11-30 ENCOUNTER — Encounter: Payer: Self-pay | Admitting: Family Medicine

## 2022-11-30 ENCOUNTER — Ambulatory Visit: Payer: Medicare Other | Admitting: Orthopaedic Surgery

## 2022-12-03 ENCOUNTER — Encounter: Payer: Self-pay | Admitting: Family Medicine

## 2022-12-03 ENCOUNTER — Telehealth (INDEPENDENT_AMBULATORY_CARE_PROVIDER_SITE_OTHER): Payer: Medicare Other | Admitting: Family Medicine

## 2022-12-03 DIAGNOSIS — R110 Nausea: Secondary | ICD-10-CM

## 2022-12-03 DIAGNOSIS — J069 Acute upper respiratory infection, unspecified: Secondary | ICD-10-CM

## 2022-12-03 MED ORDER — HYDROCODONE BIT-HOMATROP MBR 5-1.5 MG/5ML PO SOLN: 5.0000 mL | Freq: Three times a day (TID) | ORAL | 0 refills | Status: DC | PRN

## 2022-12-03 MED ORDER — ONDANSETRON 4 MG PO TBDP: 4.0000 mg | ORAL_TABLET | Freq: Three times a day (TID) | ORAL | 0 refills | Status: DC | PRN

## 2022-12-03 NOTE — Progress Notes (Signed)
Chief Complaint  Patient presents with   Cough    Chills Loss of appetite Symptoms started on Friday 11/30/2022.     Dana Burns here for URI complaints. Due to COVID-19 pandemic, we are interacting via web portal for an electronic face-to-face visit. I verified patient's ID using 2 identifiers. Patient agreed to proceed with visit via this method. Patient is at home, I am at office. Patient, her daughter, and I are present for visit.   Duration: 3 days  Associated symptoms: sinus congestion, rhinorrhea, itchy watery eyes, ear pain, wheezing, nausea, and coughing Denies: sinus pain, ear drainage, sore throat, myalgia, SOB, and vomiting, diarrhea Treatment to date: Tylenol Sick contacts: No  Past Medical History:  Diagnosis Date   Abdominal pain Oct 04, 2017   Allergy    sneeze, rhinorrhea in am   Anxiety state 09/03/2013   Breast mass, right 10/14/2012   Chicken pox as a child   Chronic pain syndrome 01/23/2015   Chronic renal insufficiency 03/17/2011   Colon cancer ([place])    Complication of anesthesia    agitated when waking up, wakes up shaking, "like  I'm going into shock"   Constipation October 04, 2017   Depression    husband died  3 months ago, pt. admits depression  currently    Diabetic neuropathy ([place]) 03/17/2011   DM (diabetes mellitus) type 2, uncontrolled, with ketoacidosis ([place]) 03/17/2011   Ductal hyperplasia of breast 03/17/2011   Essential hypertension, benign 05/31/2010   Qualifier: Diagnosis of  By: Percival Spanish, MD, Bethann Goo 07/15/2021   Fibromyalgia    GERD (gastroesophageal reflux disease)    H/O echocardiogram    done /w [place] in 2011, saw Dr. Percival Spanish for tachycardia    Headache    History of colon cancer 03/17/2011   Hyperlipidemia    6 years   Hyperlipidemia, mixed    6 years   Hypertension    15 years, reports she has a rapid heartrate    Loss of weight 12/14/2013   Measles as a child   Osteoarthritis (arthritis due to wear and tear of joints)  03/17/2011   Noted diffusely including neck   Osteopenia 12/14/2013   Pain in finger of right hand 05/15/2017   Pain in joint, lower leg 09/03/2013   Pain of right shoulder region 07/07/2014   Peripheral neuropathy    SOB (shortness of breath) 05/31/2010   Qualifier: Diagnosis of  By: Percival Spanish, MD, Farrel Gordon     Stress 06/01/2013   Tachycardia 05/31/2010   Qualifier: Diagnosis of  By: Percival Spanish, MD, Farrel Gordon     Type 2 diabetes mellitus with peripheral neuropathy ([place]) 03/17/2011   Vitamin B 12 deficiency 07/07/2014   intrinsic factor positive    Objective No conversational dyspnea Nml affect and mood  Viral URI with cough - Plan: HYDROcodone bit-homatropine (HYCODAN) 5-1.5 MG/5ML syrup  Nausea - Plan: ondansetron (ZOFRAN-ODT) 4 MG disintegrating tablet  Continue to push fluids, practice good hand hygiene, cover mouth when coughing. Rec'd she test for covid. Zofran prn to help w fluids. Hycodan prn. She rarely takes Norco and it does not make her drowsy. She has been on narcotic cough syrup before without issue. Will send again. Send message in 2-3 d if no better, will consider abx.  F/u prn. If starting to experience fevers, shaking, or shortness of breath, seek immediate care. Pt and her daughter voiced understanding and agreement to the plan.  [place], DO 12/03/22 1:48 PM

## 2022-12-07 ENCOUNTER — Ambulatory Visit: Payer: Medicare Other | Admitting: Orthopaedic Surgery

## 2022-12-14 ENCOUNTER — Ambulatory Visit: Payer: Medicare Other | Admitting: Orthopaedic Surgery

## 2022-12-14 ENCOUNTER — Ambulatory Visit (INDEPENDENT_AMBULATORY_CARE_PROVIDER_SITE_OTHER): Payer: Medicare Other

## 2022-12-14 DIAGNOSIS — M25551 Pain in right hip: Secondary | ICD-10-CM | POA: Diagnosis not present

## 2022-12-14 NOTE — Progress Notes (Unsigned)
Office Visit Note   Patient: Dana Burns           Date of Birth: [birth year]           MRN: [id number] Visit Date: 12/14/2022              Requested by: Mosie Lukes, MD [address] [place],  [place] [address] PCP: Mosie Lukes, MD   Assessment & Plan: Visit Diagnoses:  1. Pain in right hip     Plan: Patient is already had epidurals which were not effective.  She does have some stenosis in the lumbar spine but at age 88 her other general problems prevent her from doing long distance walking.  X-ray results were reviewed her hip joint looks good.  She is already had epidurals at the area appropriate based on the MRI scan without relief.  Would recommend doing a daily walking program sit and resting and then repeating.  She can return if she develops progressively radicular symptoms.  Follow-Up Instructions: No follow-ups on file.   Orders:  Orders Placed This Encounter  Procedures   XR HIP UNILAT W OR W/O PELVIS 2-3 VIEWS RIGHT   No orders of the defined types were placed in this encounter.     Procedures: No procedures performed   Clinical Data: No additional findings.   Subjective: Chief Complaint  Patient presents with   Right Knee - Pain   Right Hip - Pain    HPI 88 year old femaleWith right hip and knee pain.  She is taking hydrocodone 2 tablets daily.  She has type 2 diabetes with peripheral neuropathy, chronic pain syndrome, restless legs, type 2 diabetes on insulin,Past history of epidurals with Dr. Ernestina Patches that did not help.  She has diagnosis of fibromyalgia.  She denies upper extremity numbness or tingling.  Primarily having right hip and knee pain.  Patient's had ABIs that were normal lower extremities February right and left.  Lumbar MRI 05/29/2021 showed diffuse spondylitic changes no acute osseous injury.  Disc bulge facet arthropathy and moderate to severe stenosis at L4-5 and moderate at L3-4.  With patient's other problem she is  not sure she ambulates enough to know whether she has claudication symptoms or not.  Review of Systems all systems are noncontributory other than as mentioned above.   Objective: Vital Signs: There were no vitals taken for this visit.  Physical Exam Constitutional:      Appearance: She is well-developed.  HENT:     Head: Normocephalic.     Right Ear: External ear normal.     Left Ear: External ear normal. There is no impacted cerumen.  Eyes:     Pupils: Pupils are equal, round, and reactive to light.  Neck:     Thyroid: No thyromegaly.     Trachea: No tracheal deviation.  Cardiovascular:     Rate and Rhythm: Normal rate.  Pulmonary:     Effort: Pulmonary effort is normal.  Abdominal:     Palpations: Abdomen is soft.  Musculoskeletal:     Cervical back: No rigidity.  Skin:    General: Skin is warm and dry.  Neurological:     Mental Status: She is alert and oriented to person, place, and time.  Psychiatric:        Behavior: Behavior normal.     Ortho Exam negative logroll hips mild trochanteric tenderness.  Mild crepitus knee range of motion she does have palpable pulses dorsalis pedis posterior  tib both right and left.  Knee and ankle jerk are intact.  Specialty Comments:  MRI LUMBAR SPINE WITHOUT CONTRAST   TECHNIQUE: Multiplanar, multisequence MR imaging of the lumbar spine was performed. No intravenous contrast was administered.   COMPARISON:  None.   FINDINGS: Segmentation:  Standard.   Alignment: 1-2 mm retrolisthesis of L2 on L3. Levocurvature of the lumbar spine.   Vertebrae:  No fracture, evidence of discitis, or bone lesion.   Conus medullaris and cauda equina: Conus extends to the T12 level. Conus and cauda equina appear normal.   Paraspinal and other soft tissues: No acute paraspinal abnormality. Small right renal cysts.   Disc levels:   Disc spaces: Disc desiccation throughout the lumbar spine. Mild disc height loss at L1-2 and L2-3.    T12-L1: No significant disc bulge. No evidence of neural foraminal stenosis. No central canal stenosis.   L1-L2: Mild broad-based disc bulge. Mild bilateral facet arthropathy. No evidence of neural foraminal stenosis. No central canal stenosis.   L2-L3: Mild broad-based disc bulge. Severe bilateral facet arthropathy. No evidence of neural foraminal stenosis. No central canal stenosis.   L3-L4: Broad-based disc bulge. Severe bilateral facet arthropathy with ligamentum flavum infolding. Moderate spinal stenosis. Bilateral lateral recess stenosis. No evidence of neural foraminal stenosis.   L4-L5: Broad-based disc bulge. Severe bilateral facet arthropathy with ligamentum flavum infolding. Moderate-severe spinal stenosis. Bilateral lateral recess stenosis, left greater than right. Right lateral recess stenosis may result in impingement of the left intraspinal L5 nerve root. No evidence of neural foraminal stenosis.   L5-S1: No significant disc bulge. Severe bilateral facet arthropathy. Mild left foraminal narrowing. No right foraminal narrowing. No central canal stenosis.   IMPRESSION: 1. Diffuse lumbar spine spondylosis as described above. 2.  No acute osseous injury of the lumbar spine. 3. No aggressive osseous lesion to suggest malignancy.     Electronically Signed   By: Kathreen Devoid   On: 05/29/2021 10:29  Imaging: No results found.   PMFS History: Patient Active Problem List   Diagnosis Date Noted   RLS (restless legs syndrome) 11/21/2022   Foot pain, bilateral 01/22/2022   Skin lesion 01/22/2022   Diminished pulses in lower extremity 01/22/2022   Urinary hesitancy 10/13/2021   Palpitations 07/18/2021   Allergy 07/17/2021   Colon cancer ([facility]) 77/41/2878   Complication of anesthesia 07/17/2021   Depression 07/17/2021   H/O echocardiogram 07/17/2021   Headache 07/17/2021   Falls 07/15/2021   Abdominal pain 09/29/2017   Constipation 09/29/2017   Pain in  finger of right hand 05/15/2017   Chronic pain syndrome 01/23/2015   Vitamin B 12 deficiency 07/07/2014   Pain of right shoulder region 07/07/2014   Chicken pox    Measles    Peripheral neuropathy 07/01/2014   Loss of weight 12/14/2013   Osteopenia 12/14/2013   Anxiety state 09/03/2013   Pain in joint, lower leg 09/03/2013   Fibromyalgia 06/01/2013   Breast mass, right 10/14/2012   Type 2 diabetes mellitus with peripheral neuropathy ([facility]) 03/17/2011   Osteoarthritis (arthritis due to wear and tear of joints) 03/17/2011   Diabetic neuropathy ([facility]) 03/17/2011   Chronic renal insufficiency 03/17/2011   History of colon cancer 03/17/2011   GERD (gastroesophageal reflux disease) 03/17/2011   Ductal hyperplasia of breast 03/17/2011   Essential hypertension, benign 05/31/2010   Tachycardia 05/31/2010   SOB (shortness of breath) 05/31/2010   Past Medical History:  Diagnosis Date   Abdominal pain 09/29/2017   Allergy  sneeze, rhinorrhea in am   Anxiety state 09/03/2013   Breast mass, right 10/14/2012   Chicken pox as a child   Chronic pain syndrome 01/23/2015   Chronic renal insufficiency 03/17/2011   Colon cancer ([facility])    Complication of anesthesia    agitated when waking up, wakes up shaking, "like  I'm going into shock"   Constipation Oct 09, 2017   Depression    husband died  3 months ago, pt. admits depression  currently    Diabetic neuropathy ([facility]) 03/17/2011   DM (diabetes mellitus) type 2, uncontrolled, with ketoacidosis ([facility]) 03/17/2011   Ductal hyperplasia of breast 03/17/2011   Essential hypertension, benign 05/31/2010   Qualifier: Diagnosis of  By: Percival Spanish, MD, Bethann Goo 07/15/2021   Fibromyalgia    GERD (gastroesophageal reflux disease)    H/O echocardiogram    done /w [facility] in 2011, saw Dr. Percival Spanish for tachycardia    Headache    History of colon cancer 03/17/2011   Hyperlipidemia    6 years   Hyperlipidemia, mixed    6 years   Hypertension    15  years, reports she has a rapid heartrate    Loss of weight 12/14/2013   Measles as a child   Osteoarthritis (arthritis due to wear and tear of joints) 03/17/2011   Noted diffusely including neck   Osteopenia 12/14/2013   Pain in finger of right hand 05/15/2017   Pain in joint, lower leg 09/03/2013   Pain of right shoulder region 07/07/2014   Peripheral neuropathy    SOB (shortness of breath) 05/31/2010   Qualifier: Diagnosis of  By: Percival Spanish, MD, Farrel Gordon     Stress 06/01/2013   Tachycardia 05/31/2010   Qualifier: Diagnosis of  By: Percival Spanish, MD, Farrel Gordon     Type 2 diabetes mellitus with peripheral neuropathy ([facility]) 03/17/2011   Vitamin B 12 deficiency 07/07/2014   intrinsic factor positive    Family History  Problem Relation Age of Onset   Cancer Mother        Brain   Cancer Father        Colorectal   Cancer Sister        breast   Other Brother        pacemaker   Cancer Brother        pancreatic cancer   Arthritis Sister    Emphysema Brother    Cancer Brother    COPD Brother    Neuropathy Daughter    Fibromyalgia Daughter    Cancer Brother        metastatic colon cancer   Heart disease Neg Hx        Early    Past Surgical History:  Procedure Laterality Date   BREAST EXCISIONAL BIOPSY Right 2013   benign   BREAST SURGERY  2013   right- benign   CATARACT EXTRACTION     /w IOL- bilateral    COLON SURGERY     For resection of cancer   Nodule removed     From throat   Social History   Occupational History   Occupation: Retired  Tobacco Use   Smoking status: Never   Smokeless tobacco: Never  Substance and Sexual Activity   Alcohol use: No   Drug use: No   Sexual activity: Never    Comment: lives with daughter. no dietary restrictions

## 2022-12-21 ENCOUNTER — Ambulatory Visit (INDEPENDENT_AMBULATORY_CARE_PROVIDER_SITE_OTHER): Payer: Medicare Other

## 2022-12-21 DIAGNOSIS — E538 Deficiency of other specified B group vitamins: Secondary | ICD-10-CM | POA: Diagnosis not present

## 2022-12-21 MED ORDER — CYANOCOBALAMIN 1000 MCG/ML IJ SOLN
1000.0000 ug | Freq: Once | INTRAMUSCULAR | Status: AC
  Administered 2022-12-21: 1000 ug via INTRAMUSCULAR

## 2022-12-21 NOTE — Progress Notes (Signed)
Pt here for monthly B12 injection per Dr. Blyth  B12 1000mcg given L deltoid IM, and pt tolerated injection well.  Next B12 injection scheduled for 1 month.     

## 2023-01-07 ENCOUNTER — Telehealth (INDEPENDENT_AMBULATORY_CARE_PROVIDER_SITE_OTHER): Payer: Medicare Other | Admitting: Family Medicine

## 2023-01-07 DIAGNOSIS — J209 Acute bronchitis, unspecified: Secondary | ICD-10-CM | POA: Diagnosis not present

## 2023-01-07 MED ORDER — DOXYCYCLINE HYCLATE 100 MG PO CAPS: 100.0000 mg | ORAL_CAPSULE | Freq: Two times a day (BID) | ORAL | 0 refills | Status: DC

## 2023-01-07 NOTE — Progress Notes (Signed)
[facility] [address], [place], [place] [address] [phone]  Date:  01/07/2023   Name:  Dana Burns   DOB:  1926/09/11   MRN:  [id number]  PCP:  Mosie Lukes, MD    Chief Complaint: No chief complaint on file.   History of Present Illness:  Dana Burns is a 89 y.o. very pleasant female patient who presents with the following:  Virtual visit today for concern of illness-primary patient of my partner Dr.Blyth  Patient location is home, my location is office.  Patient identity confirmed with 2 factors, she gives consent for virtual visit today. The patient and myself as well as her daughter Dana Burns are present on the visit today Elderly lady who notes symptoms of  She was seen virtually by my partner Dr. Nani Ravens on 12/4: Viral URI with cough - Plan: HYDROcodone bit-homatropine (HYCODAN) 5-1.5 MG/5ML syrup Nausea - Plan: ondansetron (ZOFRAN-ODT) 4 MG disintegrating tablet Continue to push fluids, practice good hand hygiene, cover mouth when coughing. Rec'd she test for covid. Zofran prn to help w fluids. Hycodan prn. She rarely takes Norco and it does not make her drowsy. She has been on narcotic cough syrup before without issue. Will send again. Send message in 2-3 d if no better, will consider abx.  F/u prn. If starting to experience fevers, shaking, or shortness of breath, seek immediate care. Pt and her daughter voiced understanding and agreement to the plan.  Pt notes she got better in between but then sickened again about 6 days ago Bad cough  They have not noted a fever but not checking She is using the cough med  She "passed out a few times" on Friday Seemed to just sort of lose conscious while seated but her daughter could rouse her again  Not really able to eat or drink for the last 5 days  She is taking just small sips of fluid  Discussed in detail with patient and her daughter.  Advised patient that  unfortunately if she does not seek help at the hospital I am afraid she could die.  The patient is adamant that she does not wish to go to the hospital.  Her daughter was upset, but we talked and she understands that her mom is able to make this decision and is nearly 89 years old.  She states her mother has been in a lot of pain due to leg issues for the last 2 to 3 years.  Daughter states she has asked her mother to come to the hospital multiple times but she has declined I encouraged her to gather family members for support if she can.  She has 2 sons and daughters in law  Patient Active Problem List   Diagnosis Date Noted   RLS (restless legs syndrome) 11/21/2022   Foot pain, bilateral 01/22/2022   Skin lesion 01/22/2022   Diminished pulses in lower extremity 01/22/2022   Urinary hesitancy 10/13/2021   Palpitations 07/18/2021   Allergy 07/17/2021   Colon cancer ([facility]) 47/42/5956   Complication of anesthesia 07/17/2021   Depression 07/17/2021   H/O echocardiogram 07/17/2021   Headache 07/17/2021   Falls 07/15/2021   Abdominal pain 09/29/2017   Constipation 09/29/2017   Pain in finger of right hand 05/15/2017   Chronic pain syndrome 01/23/2015   Vitamin B 12 deficiency 07/07/2014   Pain of right shoulder region 07/07/2014   Chicken pox  Measles    Peripheral neuropathy 07/01/2014   Loss of weight 12/14/2013   Osteopenia 12/14/2013   Anxiety state 09/03/2013   Pain in joint, lower leg 09/03/2013   Fibromyalgia 06/01/2013   Breast mass, right 10/14/2012   Type 2 diabetes mellitus with peripheral neuropathy ([facility]) 03/17/2011   Osteoarthritis (arthritis due to wear and tear of joints) 03/17/2011   Diabetic neuropathy ([facility]) 03/17/2011   Chronic renal insufficiency 03/17/2011   History of colon cancer 03/17/2011   GERD (gastroesophageal reflux disease) 03/17/2011   Ductal hyperplasia of breast 03/17/2011   Essential hypertension, benign 05/31/2010   Tachycardia 05/31/2010   SOB  (shortness of breath) 05/31/2010    Past Medical History:  Diagnosis Date   Abdominal pain 10/02/17   Allergy    sneeze, rhinorrhea in am   Anxiety state 09/03/2013   Breast mass, right 10/14/2012   Chicken pox as a child   Chronic pain syndrome 01/23/2015   Chronic renal insufficiency 03/17/2011   Colon cancer ([facility])    Complication of anesthesia    agitated when waking up, wakes up shaking, "like  I'm going into shock"   Constipation October 02, 2017   Depression    husband died  3 months ago, pt. admits depression  currently    Diabetic neuropathy ([facility]) 03/17/2011   DM (diabetes mellitus) type 2, uncontrolled, with ketoacidosis ([place]) 03/17/2011   Ductal hyperplasia of breast 03/17/2011   Essential hypertension, benign 05/31/2010   Qualifier: Diagnosis of  By: Percival Spanish, MD, Bethann Goo 07/15/2021   Fibromyalgia    GERD (gastroesophageal reflux disease)    H/O echocardiogram    done /w [place] in 2011, saw Dr. Percival Spanish for tachycardia    Headache    History of colon cancer 03/17/2011   Hyperlipidemia    6 years   Hyperlipidemia, mixed    6 years   Hypertension    15 years, reports she has a rapid heartrate    Loss of weight 12/14/2013   Measles as a child   Osteoarthritis (arthritis due to wear and tear of joints) 03/17/2011   Noted diffusely including neck   Osteopenia 12/14/2013   Pain in finger of right hand 05/15/2017   Pain in joint, lower leg 09/03/2013   Pain of right shoulder region 07/07/2014   Peripheral neuropathy    SOB (shortness of breath) 05/31/2010   Qualifier: Diagnosis of  By: Percival Spanish, MD, Farrel Gordon     Stress 06/01/2013   Tachycardia 05/31/2010   Qualifier: Diagnosis of  By: Percival Spanish, MD, Farrel Gordon     Type 2 diabetes mellitus with peripheral neuropathy ([facility]) 03/17/2011   Vitamin B 12 deficiency 07/07/2014   intrinsic factor positive    Past Surgical History:  Procedure Laterality Date   BREAST EXCISIONAL BIOPSY Right 2013   benign   BREAST SURGERY   2013   right- benign   CATARACT EXTRACTION     /w IOL- bilateral    COLON SURGERY     For resection of cancer   Nodule removed     From throat    Social History   Tobacco Use   Smoking status: Never   Smokeless tobacco: Never  Substance Use Topics   Alcohol use: No   Drug use: No    Family History  Problem Relation Age of Onset   Cancer Mother        Brain   Cancer Father        Colorectal  Cancer Sister        breast   Other Brother        pacemaker   Cancer Brother        pancreatic cancer   Arthritis Sister    Emphysema Brother    Cancer Brother    COPD Brother    Neuropathy Daughter    Fibromyalgia Daughter    Cancer Brother        metastatic colon cancer   Heart disease Neg Hx        Early    Allergies  Allergen Reactions   Tape Itching and Rash   Actos [Pioglitazone Hydrochloride] Other (See Comments)    Unknown   Buspar [Buspirone Hcl] Other (See Comments)    Unknown   Lipitor [Atorvastatin Calcium] Other (See Comments)    "It made me hurt all over."   Penicillins Rash    Medication list has been reviewed and updated.  Current Outpatient Medications on File Prior to Visit  Medication Sig Dispense Refill   acetaminophen (TYLENOL) 500 MG tablet Take 500 mg by mouth every 6 (six) hours as needed for pain.     calcium carbonate (OS-CAL) 600 MG TABS Take 600 mg by mouth 2 (two) times daily with a meal.     Cholecalciferol (VITAMIN D3) 50 MCG (2000 UT) capsule Take 2,000 Units by mouth daily.      citalopram (CELEXA) 20 MG tablet TAKE 1 TABLET BY MOUTH EVERY DAY 90 tablet 1   Cyanocobalamin (B-12 IJ) Place 1,000 mcg under the tongue daily.     diazepam (VALIUM) 5 MG tablet TAKE 1/2 TO 1 TABLET DAILY AS NEEDED FOR ANXIETY 30 tablet 1   famotidine (PEPCID) 40 MG tablet Take 1 tablet (40 mg total) by mouth at bedtime. 30 tablet 3   gabapentin (NEURONTIN) 800 MG tablet Take 1 tablet (800 mg total) by mouth 3 (three) times daily. 270 tablet 1    HYDROcodone bit-homatropine (HYCODAN) 5-1.5 MG/5ML syrup Take 5 mLs by mouth every 8 (eight) hours as needed for cough. 120 mL 0   HYDROcodone-acetaminophen (NORCO) 5-325 MG tablet Take 1 tablet by mouth 3 (three) times daily as needed for moderate pain. 60 tablet 0   ondansetron (ZOFRAN-ODT) 4 MG disintegrating tablet Take 1 tablet (4 mg total) by mouth every 8 (eight) hours as needed for nausea or vomiting. 20 tablet 0   pantoprazole (PROTONIX) 40 MG tablet Take 1 tablet (40 mg total) by mouth daily. 90 tablet 1   rOPINIRole (REQUIP) 1 MG tablet Take 1 tablet (1 mg total) by mouth at bedtime. 30 tablet 3   tiZANidine (ZANAFLEX) 4 MG tablet Take 1 tablet (4 mg total) by mouth 2 (two) times daily as needed for muscle spasms. 180 tablet 1   venlafaxine XR (EFFEXOR-XR) 37.5 MG 24 hr capsule Take 1 capsule (37.5 mg total) by mouth daily with breakfast. 90 capsule 1   No current facility-administered medications on file prior to visit.    Physical Examination: There were no vitals filed for this visit. There were no vitals filed for this visit. There is no height or weight on file to calculate BMI. Ideal Body Weight:    Patient is laying down in bed, elderly lady.  Appears frail Clearly states to me that she does not wish to go to the hospital.  Assessment and Plan: Acute bronchitis, unspecified organism - Plan: doxycycline (VIBRAMYCIN) 100 MG capsule  Patient seen today with concern of cough, poor oral intake, possible  presyncope or syncope a few days ago now resolved I have advised patient and her daughter that her condition may be serious, if she does not seek care at the hospital I am afraid that she could pass away. The patient is nearly 89 years old, per her daughter she has been in chronic pain for a couple of years.  At this point she very clearly declines to come in for any further care. I called in doxycycline, they have cough syrup still available Encouraged her daughter to offer  frequent sips of fluids, perhaps sports drink or ginger ale may help with calories.  Can offer bites of bland food as tolerated.  They will let me know if I can do anything else to help   Signed Lamar Blinks, MD

## 2023-01-08 ENCOUNTER — Emergency Department (HOSPITAL_COMMUNITY): Payer: Medicare Other

## 2023-01-08 ENCOUNTER — Observation Stay (HOSPITAL_COMMUNITY): Payer: Medicare Other

## 2023-01-08 ENCOUNTER — Inpatient Hospital Stay (HOSPITAL_COMMUNITY)
Admission: EM | Admit: 2023-01-08 | Discharge: 2023-01-17 | DRG: 202 | Disposition: A | Discharge status: Home Health | Payer: Medicare Other | Attending: Family Medicine | Admitting: Family Medicine

## 2023-01-08 ENCOUNTER — Other Ambulatory Visit: Payer: Self-pay

## 2023-01-08 DIAGNOSIS — I4891 Unspecified atrial fibrillation: Secondary | ICD-10-CM | POA: Diagnosis not present

## 2023-01-08 DIAGNOSIS — K219 Gastro-esophageal reflux disease without esophagitis: Secondary | ICD-10-CM | POA: Diagnosis not present

## 2023-01-08 DIAGNOSIS — I471 Supraventricular tachycardia, unspecified: Secondary | ICD-10-CM | POA: Diagnosis not present

## 2023-01-08 DIAGNOSIS — M797 Fibromyalgia: Secondary | ICD-10-CM | POA: Diagnosis not present

## 2023-01-08 DIAGNOSIS — I10 Essential (primary) hypertension: Secondary | ICD-10-CM | POA: Diagnosis not present

## 2023-01-08 DIAGNOSIS — Z66 Do not resuscitate: Secondary | ICD-10-CM | POA: Diagnosis present

## 2023-01-08 DIAGNOSIS — I4510 Unspecified right bundle-branch block: Secondary | ICD-10-CM | POA: Diagnosis not present

## 2023-01-08 DIAGNOSIS — E782 Mixed hyperlipidemia: Secondary | ICD-10-CM | POA: Diagnosis present

## 2023-01-08 DIAGNOSIS — E43 Unspecified severe protein-calorie malnutrition: Secondary | ICD-10-CM | POA: Insufficient documentation

## 2023-01-08 DIAGNOSIS — J9811 Atelectasis: Secondary | ICD-10-CM | POA: Diagnosis not present

## 2023-01-08 DIAGNOSIS — N179 Acute kidney failure, unspecified: Secondary | ICD-10-CM | POA: Diagnosis not present

## 2023-01-08 DIAGNOSIS — J440 Chronic obstructive pulmonary disease with acute lower respiratory infection: Secondary | ICD-10-CM | POA: Diagnosis present

## 2023-01-08 DIAGNOSIS — I130 Hypertensive heart and chronic kidney disease with heart failure and stage 1 through stage 4 chronic kidney disease, or unspecified chronic kidney disease: Secondary | ICD-10-CM | POA: Diagnosis not present

## 2023-01-08 DIAGNOSIS — Z1152 Encounter for screening for COVID-19: Secondary | ICD-10-CM | POA: Diagnosis not present

## 2023-01-08 DIAGNOSIS — R6889 Other general symptoms and signs: Secondary | ICD-10-CM | POA: Diagnosis not present

## 2023-01-08 DIAGNOSIS — E1142 Type 2 diabetes mellitus with diabetic polyneuropathy: Secondary | ICD-10-CM | POA: Diagnosis present

## 2023-01-08 DIAGNOSIS — G2581 Restless legs syndrome: Secondary | ICD-10-CM | POA: Diagnosis not present

## 2023-01-08 DIAGNOSIS — N189 Chronic kidney disease, unspecified: Secondary | ICD-10-CM | POA: Diagnosis present

## 2023-01-08 DIAGNOSIS — R54 Age-related physical debility: Secondary | ICD-10-CM | POA: Diagnosis not present

## 2023-01-08 DIAGNOSIS — F32A Depression, unspecified: Secondary | ICD-10-CM | POA: Diagnosis not present

## 2023-01-08 DIAGNOSIS — R059 Cough, unspecified: Secondary | ICD-10-CM | POA: Diagnosis not present

## 2023-01-08 DIAGNOSIS — R1111 Vomiting without nausea: Secondary | ICD-10-CM | POA: Diagnosis not present

## 2023-01-08 DIAGNOSIS — Z85038 Personal history of other malignant neoplasm of large intestine: Secondary | ICD-10-CM

## 2023-01-08 DIAGNOSIS — R531 Weakness: Secondary | ICD-10-CM

## 2023-01-08 DIAGNOSIS — Z800 Family history of malignant neoplasm of digestive organs: Secondary | ICD-10-CM

## 2023-01-08 DIAGNOSIS — Z825 Family history of asthma and other chronic lower respiratory diseases: Secondary | ICD-10-CM

## 2023-01-08 DIAGNOSIS — J479 Bronchiectasis, uncomplicated: Secondary | ICD-10-CM | POA: Diagnosis not present

## 2023-01-08 DIAGNOSIS — J210 Acute bronchiolitis due to respiratory syncytial virus: Principal | ICD-10-CM | POA: Diagnosis present

## 2023-01-08 DIAGNOSIS — E440 Moderate protein-calorie malnutrition: Secondary | ICD-10-CM | POA: Diagnosis not present

## 2023-01-08 DIAGNOSIS — R197 Diarrhea, unspecified: Secondary | ICD-10-CM | POA: Diagnosis not present

## 2023-01-08 DIAGNOSIS — J9601 Acute respiratory failure with hypoxia: Principal | ICD-10-CM | POA: Diagnosis present

## 2023-01-08 DIAGNOSIS — R918 Other nonspecific abnormal finding of lung field: Secondary | ICD-10-CM | POA: Diagnosis not present

## 2023-01-08 DIAGNOSIS — I5032 Chronic diastolic (congestive) heart failure: Secondary | ICD-10-CM | POA: Diagnosis present

## 2023-01-08 DIAGNOSIS — E1122 Type 2 diabetes mellitus with diabetic chronic kidney disease: Secondary | ICD-10-CM | POA: Diagnosis present

## 2023-01-08 DIAGNOSIS — Z743 Need for continuous supervision: Secondary | ICD-10-CM | POA: Diagnosis not present

## 2023-01-08 DIAGNOSIS — I4819 Other persistent atrial fibrillation: Secondary | ICD-10-CM | POA: Diagnosis not present

## 2023-01-08 DIAGNOSIS — M6281 Muscle weakness (generalized): Secondary | ICD-10-CM | POA: Diagnosis not present

## 2023-01-08 LAB — CBC WITH DIFFERENTIAL/PLATELET
Abs Immature Granulocytes: 0.02 10*3/uL (ref 0.00–0.07)
Basophils Absolute: 0 10*3/uL (ref 0.0–0.1)
Basophils Relative: 0 %
Eosinophils Absolute: 0 10*3/uL (ref 0.0–0.5)
Eosinophils Relative: 0 %
HCT: 45.6 % (ref 36.0–46.0)
Hemoglobin: 14.4 g/dL (ref 12.0–15.0)
Immature Granulocytes: 0 %
Lymphocytes Relative: 11 %
Lymphs Abs: 0.7 10*3/uL (ref 0.7–4.0)
MCH: 27.1 pg (ref 26.0–34.0)
MCHC: 31.6 g/dL (ref 30.0–36.0)
MCV: 85.7 fL (ref 80.0–100.0)
Monocytes Absolute: 0.7 10*3/uL (ref 0.1–1.0)
Monocytes Relative: 11 %
Neutro Abs: 5.3 10*3/uL (ref 1.7–7.7)
Neutrophils Relative %: 78 %
Platelets: 256 10*3/uL (ref 150–400)
RBC: 5.32 MIL/uL — ABNORMAL HIGH (ref 3.87–5.11)
RDW: 14.6 % (ref 11.5–15.5)
WBC: 6.8 10*3/uL (ref 4.0–10.5)
nRBC: 0 % (ref 0.0–0.2)

## 2023-01-08 LAB — RESP PANEL BY RT-PCR (RSV, FLU A&B, COVID)  RVPGX2
Influenza A by PCR: NEGATIVE
Influenza B by PCR: NEGATIVE
Resp Syncytial Virus by PCR: POSITIVE — AB
SARS Coronavirus 2 by RT PCR: NEGATIVE

## 2023-01-08 LAB — COMPREHENSIVE METABOLIC PANEL
ALT: 14 U/L (ref 0–44)
AST: 26 U/L (ref 15–41)
Albumin: 3.9 g/dL (ref 3.5–5.0)
Alkaline Phosphatase: 53 U/L (ref 38–126)
Anion gap: 13 (ref 5–15)
BUN: 30 mg/dL — ABNORMAL HIGH (ref 8–23)
CO2: 22 mmol/L (ref 22–32)
Calcium: 9.5 mg/dL (ref 8.9–10.3)
Chloride: 103 mmol/L (ref 98–111)
Creatinine, Ser: 1.16 mg/dL — ABNORMAL HIGH (ref 0.44–1.00)
GFR, Estimated: 44 mL/min — ABNORMAL LOW (ref >60)
Glucose, Bld: 124 mg/dL — ABNORMAL HIGH (ref 70–99)
Potassium: 4.3 mmol/L (ref 3.5–5.1)
Sodium: 138 mmol/L (ref 135–145)
Total Bilirubin: 1 mg/dL (ref 0.3–1.2)
Total Protein: 7.8 g/dL (ref 6.5–8.1)

## 2023-01-08 LAB — LACTIC ACID, PLASMA: Lactic Acid, Venous: 1.5 mmol/L (ref 0.5–1.9)

## 2023-01-08 LAB — TROPONIN I (HIGH SENSITIVITY): Troponin I (High Sensitivity): 17 ng/L (ref <18)

## 2023-01-08 LAB — LIPASE, BLOOD: Lipase: 30 U/L (ref 11–51)

## 2023-01-08 MED ORDER — ENOXAPARIN SODIUM 40 MG/0.4ML IJ SOSY
40.0000 mg | PREFILLED_SYRINGE | INTRAMUSCULAR | Status: DC
  Administered 2023-01-09 – 2023-01-14 (×6): 40 mg via SUBCUTANEOUS
  Filled 2023-01-08 (×6): qty 0.4

## 2023-01-08 MED ORDER — ALBUTEROL SULFATE HFA 108 (90 BASE) MCG/ACT IN AERS
2.0000 | INHALATION_SPRAY | Freq: Once | RESPIRATORY_TRACT | Status: AC
  Administered 2023-01-08: 2 via RESPIRATORY_TRACT
  Filled 2023-01-08: qty 6.7

## 2023-01-08 MED ORDER — PREDNISONE 20 MG PO TABS
40.0000 mg | ORAL_TABLET | Freq: Every day | ORAL | Status: DC
  Administered 2023-01-09 – 2023-01-10 (×2): 40 mg via ORAL
  Filled 2023-01-08 (×2): qty 2

## 2023-01-08 MED ORDER — ACETAMINOPHEN 500 MG PO TABS
1000.0000 mg | ORAL_TABLET | Freq: Once | ORAL | Status: AC
  Administered 2023-01-08: 1000 mg via ORAL
  Filled 2023-01-08: qty 2

## 2023-01-08 MED ORDER — INSULIN ASPART 100 UNIT/ML IJ SOLN
0.0000 [IU] | Freq: Three times a day (TID) | INTRAMUSCULAR | Status: DC
  Administered 2023-01-09 (×2): 3 [IU] via SUBCUTANEOUS
  Administered 2023-01-09: 5 [IU] via SUBCUTANEOUS
  Administered 2023-01-10: 2 [IU] via SUBCUTANEOUS
  Administered 2023-01-11: 5 [IU] via SUBCUTANEOUS
  Administered 2023-01-11: 2 [IU] via SUBCUTANEOUS
  Administered 2023-01-12: 11 [IU] via SUBCUTANEOUS
  Administered 2023-01-12: 3 [IU] via SUBCUTANEOUS
  Administered 2023-01-12: 2 [IU] via SUBCUTANEOUS
  Administered 2023-01-13 (×2): 3 [IU] via SUBCUTANEOUS
  Administered 2023-01-14: 2 [IU] via SUBCUTANEOUS
  Administered 2023-01-14: 11 [IU] via SUBCUTANEOUS
  Administered 2023-01-15: 15 [IU] via SUBCUTANEOUS
  Administered 2023-01-16: 3 [IU] via SUBCUTANEOUS
  Filled 2023-01-08: qty 0.15

## 2023-01-08 MED ORDER — LEVALBUTEROL HCL 0.63 MG/3ML IN NEBU
0.6300 mg | INHALATION_SOLUTION | Freq: Four times a day (QID) | RESPIRATORY_TRACT | Status: DC | PRN
  Administered 2023-01-10: 0.63 mg via RESPIRATORY_TRACT
  Filled 2023-01-08 (×2): qty 3

## 2023-01-08 MED ORDER — MOMETASONE FURO-FORMOTEROL FUM 200-5 MCG/ACT IN AERO
2.0000 | INHALATION_SPRAY | Freq: Two times a day (BID) | RESPIRATORY_TRACT | Status: DC
  Administered 2023-01-09 – 2023-01-17 (×17): 2 via RESPIRATORY_TRACT
  Filled 2023-01-08: qty 8.8

## 2023-01-08 MED ORDER — METHYLPREDNISOLONE SODIUM SUCC 125 MG IJ SOLR
125.0000 mg | Freq: Once | INTRAMUSCULAR | Status: AC
  Administered 2023-01-08: 125 mg via INTRAVENOUS
  Filled 2023-01-08: qty 2

## 2023-01-08 MED ORDER — LACTATED RINGERS IV BOLUS
500.0000 mL | Freq: Once | INTRAVENOUS | Status: AC
  Administered 2023-01-08: 500 mL via INTRAVENOUS

## 2023-01-08 MED ORDER — ONDANSETRON HCL 4 MG/2ML IJ SOLN
4.0000 mg | Freq: Once | INTRAMUSCULAR | Status: AC
  Administered 2023-01-08: 4 mg via INTRAVENOUS
  Filled 2023-01-08: qty 2

## 2023-01-08 MED ORDER — UMECLIDINIUM BROMIDE 62.5 MCG/ACT IN AEPB
1.0000 | INHALATION_SPRAY | Freq: Every day | RESPIRATORY_TRACT | Status: DC
  Administered 2023-01-10 – 2023-01-17 (×8): 1 via RESPIRATORY_TRACT
  Filled 2023-01-08 (×3): qty 7

## 2023-01-08 NOTE — Assessment & Plan Note (Signed)
Listed on chart, doesn't look like she's on any home meds for this and BGL 124 in ED; however, pt getting steroids. Therefore will put pt on SSI mod scale AC/HS in case this is needed.

## 2023-01-08 NOTE — Assessment & Plan Note (Signed)
Chart diagnosis, med rec pending, but doesn't look like

## 2023-01-08 NOTE — H&P (Signed)
History and Physical    Patient: Dana Burns:[id number] DOB: 11/18/1926 DOA: 01/08/2023 DOS: the patient was seen and examined on 01/08/2023 PCP: Mosie Lukes, MD  Patient coming from: Home  Chief Complaint:  Chief Complaint  Patient presents with   Cough   Weakness   HPI: Dana Burns is a 89 y.o. female with medical history significant of DM2, HTN.  Pt presents to ED with 7 day h/o nonproductive cough, fever, chills, anorexia, worsening generalized weakness.  Few episodes of vomiting on Thurs, occasional diarrhea.  No leg swelling, no med changes.    Review of Systems: As mentioned in the history of present illness. All other systems reviewed and are negative. Past Medical History:  Diagnosis Date   Abdominal pain 10/25/17   Allergy    sneeze, rhinorrhea in am   Anxiety state 09/03/2013   Breast mass, right 10/14/2012   Chicken pox as a child   Chronic pain syndrome 01/23/2015   Chronic renal insufficiency 03/17/2011   Colon cancer ([place])    Complication of anesthesia    agitated when waking up, wakes up shaking, "like  I'm going into shock"   Constipation 2017-10-25   Depression    husband died  3 months ago, pt. admits depression  currently    Diabetic neuropathy ([place]) 03/17/2011   DM (diabetes mellitus) type 2, uncontrolled, with ketoacidosis ([place]) 03/17/2011   Ductal hyperplasia of breast 03/17/2011   Essential hypertension, benign 05/31/2010   Qualifier: Diagnosis of  By: Percival Spanish, MD, Bethann Goo 07/15/2021   Fibromyalgia    GERD (gastroesophageal reflux disease)    H/O echocardiogram    done /w [place] in 2011, saw Dr. Percival Spanish for tachycardia    Headache    History of colon cancer 03/17/2011   Hyperlipidemia    6 years   Hyperlipidemia, mixed    6 years   Hypertension    15 years, reports she has a rapid heartrate    Loss of weight 12/14/2013   Measles as a child   Osteoarthritis (arthritis due to wear and tear of joints) 03/17/2011   Noted  diffusely including neck   Osteopenia 12/14/2013   Pain in finger of right hand 05/15/2017   Pain in joint, lower leg 09/03/2013   Pain of right shoulder region 07/07/2014   Peripheral neuropathy    SOB (shortness of breath) 05/31/2010   Qualifier: Diagnosis of  By: Percival Spanish, MD, Farrel Gordon     Stress 06/01/2013   Tachycardia 05/31/2010   Qualifier: Diagnosis of  By: Percival Spanish, MD, Farrel Gordon     Type 2 diabetes mellitus with peripheral neuropathy ([place]) 03/17/2011   Vitamin B 12 deficiency 07/07/2014   intrinsic factor positive   Past Surgical History:  Procedure Laterality Date   BREAST EXCISIONAL BIOPSY Right 2013   benign   BREAST SURGERY  2013   right- benign   CATARACT EXTRACTION     /w IOL- bilateral    COLON SURGERY     For resection of cancer   Nodule removed     From throat   Social History:  reports that she has never smoked. She has never used smokeless tobacco. She reports that she does not drink alcohol and does not use drugs.  Allergies  Allergen Reactions   Tape Itching and Rash   Actos [Pioglitazone Hydrochloride] Other (See Comments)    Unknown reaction   Buspar [Buspirone Hcl] Other (See Comments)    Unknown  reaction   Doxycycline Other (See Comments)    Made the patient "feel funny"- in a negative way   Lipitor [Atorvastatin Calcium] Other (See Comments)    "It made me hurt all over."   Penicillins Rash    Family History  Problem Relation Age of Onset   Cancer Mother        Brain   Cancer Father        Colorectal   Cancer Sister        breast   Other Brother        pacemaker   Cancer Brother        pancreatic cancer   Arthritis Sister    Emphysema Brother    Cancer Brother    COPD Brother    Neuropathy Daughter    Fibromyalgia Daughter    Cancer Brother        metastatic colon cancer   Heart disease Neg Hx        Early    Prior to Admission medications   Medication Sig Start Date End Date Taking? Authorizing Provider  acetaminophen  (TYLENOL) 500 MG tablet Take 500 mg by mouth every 6 (six) hours as needed for pain.   Yes [provider]  citalopram (CELEXA) 20 MG tablet TAKE 1 TABLET BY MOUTH EVERY DAY Patient taking differently: Take 20 mg by mouth daily. 07/02/22  Yes Mosie Lukes, MD  diazepam (VALIUM) 5 MG tablet TAKE 1/2 TO 1 TABLET DAILY AS NEEDED FOR ANXIETY Patient taking differently: Take 2.5 mg by mouth daily. 11/20/22  Yes Mosie Lukes, MD  doxycycline (VIBRAMYCIN) 100 MG capsule Take 1 capsule (100 mg total) by mouth 2 (two) times daily. 01/07/23  Yes Copland, Gay Filler, MD  gabapentin (NEURONTIN) 800 MG tablet Take 1 tablet (800 mg total) by mouth 3 (three) times daily. 11/20/22  Yes Mosie Lukes, MD  rOPINIRole (REQUIP) 1 MG tablet Take 1 tablet (1 mg total) by mouth at bedtime. 08/14/22  Yes Mosie Lukes, MD  tiZANidine (ZANAFLEX) 4 MG tablet Take 1 tablet (4 mg total) by mouth 2 (two) times daily as needed for muscle spasms. 08/14/22  Yes Mosie Lukes, MD  venlafaxine XR (EFFEXOR-XR) 37.5 MG 24 hr capsule Take 1 capsule (37.5 mg total) by mouth daily with breakfast. 08/14/22  Yes Mosie Lukes, MD  calcium carbonate (OS-CAL) 600 MG TABS Take 600 mg by mouth 2 (two) times daily with a meal.    [provider]  Cholecalciferol (VITAMIN D3) 50 MCG (2000 UT) capsule Take 2,000 Units by mouth daily.     [provider]  Cyanocobalamin (B-12 IJ) Place 1,000 mcg under the tongue daily.    [provider]  famotidine (PEPCID) 40 MG tablet Take 1 tablet (40 mg total) by mouth at bedtime. 11/20/22   Mosie Lukes, MD  HYDROcodone bit-homatropine (HYCODAN) 5-1.5 MG/5ML syrup Take 5 mLs by mouth every 8 (eight) hours as needed for cough. 12/03/22   Shelda Pal, DO  HYDROcodone-acetaminophen (NORCO) 5-325 MG tablet Take 1 tablet by mouth 3 (three) times daily as needed for moderate pain. 11/20/22   Mosie Lukes, MD  ondansetron (ZOFRAN-ODT) 4 MG disintegrating  tablet Take 1 tablet (4 mg total) by mouth every 8 (eight) hours as needed for nausea or vomiting. 12/03/22   Shelda Pal, DO  pantoprazole (PROTONIX) 40 MG tablet Take 1 tablet (40 mg total) by mouth daily. 04/30/22   Terrilyn Saver,  NP    Physical Exam: Vitals:   01/08/23 1819 01/08/23 1900 01/08/23 2000 01/08/23 2100  BP: (!) 161/81 (!) 153/81 (!) 144/71 (!) 140/76  Pulse: 88 96 69 84  Resp: 19 (!) 26 (!) 22 (!) 30  Temp:   97.9 F (36.6 C)   TempSrc:   Oral   SpO2: (!) 89% 97% 95% 96%   Constitutional: Elderly and frail Eyes: PERRL, lids and conjunctivae normal ENMT: Mucous membranes are moist. Posterior pharynx clear of any exudate or lesions.Normal dentition.  Neck: normal, supple, no masses, no thyromegaly Respiratory: wheezing and wet sounding cough Cardiovascular: Regular rate and rhythm, no murmurs / rubs / gallops. No extremity edema. 2+ pedal pulses. No carotid bruits.  Abdomen: no tenderness, no masses palpated. No hepatosplenomegaly. Bowel sounds positive.  Musculoskeletal: no clubbing / cyanosis. No joint deformity upper and lower extremities. Good ROM, no contractures. Normal muscle tone.  Skin: no rashes, lesions, ulcers. No induration Neurologic: CN 2-12 grossly intact. Sensation intact, DTR normal. Strength 5/5 in all 4.  Psychiatric: Normal judgment and insight. Alert and oriented x 3. Normal mood.   Data Reviewed:       Latest Ref Rng & Units 01/08/2023    4:19 PM 11/20/2022    4:15 PM 09/21/2022    1:55 PM  CBC  WBC 4.0 - 10.5 K/uL 6.8  5.8  4.4   Hemoglobin 12.0 - 15.0 g/dL 14.4  12.8  12.7   Hematocrit 36.0 - 46.0 % 45.6  39.8  39.3   Platelets 150 - 400 K/uL 256  262.0  233.0       Latest Ref Rng & Units 01/08/2023    4:19 PM 11/20/2022    4:15 PM 10/05/2022   10:59 AM  CMP  Glucose 70 - 99 mg/dL 124  94  102   BUN 8 - 23 mg/dL '30  27  20   '$ Creatinine 0.44 - 1.00 mg/dL 1.16  1.21  1.30   Sodium 135 - 145 mmol/L 138  138  139   Potassium  3.5 - 5.1 mmol/L 4.3  4.8  4.9   Chloride 98 - 111 mmol/L 103  100  104   CO2 22 - 32 mmol/L 22  32  31   Calcium 8.9 - 10.3 mg/dL 9.5  9.6  9.3   Total Protein 6.5 - 8.1 g/dL 7.8  7.0  6.4   Total Bilirubin 0.3 - 1.2 mg/dL 1.0  0.2  0.2   Alkaline Phos 38 - 126 U/L 53  68  60   AST 15 - 41 U/L '26  18  15   '$ ALT 0 - 44 U/L '14  11  9    '$ CXR = IMPRESSION: Basilar subsegmental atelectasis or scarring. Prominent cardiac silhouette.   RSV positive.  Assessment and Plan: * RSV bronchiolitis COPD pathway Steroids Scheduled and PRN nebs Can't ribavirin due to GFR just under 50 Holding off on ABx for the moment until/unless we get higher suspicion of superimposed bacterial PNA: WBC nl CXR = no PNA Check procalcitonin Check CT chest non-contrast Cont pulse ox  Type 2 diabetes mellitus with peripheral neuropathy ([place]) Listed on chart, doesn't look like she's on any home meds for this and BGL 124 in ED; however, pt getting steroids. Therefore will put pt on SSI mod scale AC/HS in case this is needed.  Essential hypertension, benign Chart diagnosis, med rec pending, but doesn't look like      Advance Care Planning:  Code Status: DNR Confirmed with patient and family  Consults: None  Family Communication: Family member at bedside  Severity of Illness: The appropriate patient status for this patient is OBSERVATION. Observation status is judged to be reasonable and necessary in order to provide the required intensity of service to ensure the patient's safety. The patient's presenting symptoms, physical exam findings, and initial radiographic and laboratory data in the context of their medical condition is felt to place them at decreased risk for further clinical deterioration. Furthermore, it is anticipated that the patient will be medically stable for discharge from the hospital within 2 midnights of admission.   Author: Etta Quill., DO 01/08/2023 10:42 PM  For on call review  [email or web address].

## 2023-01-08 NOTE — ED Triage Notes (Signed)
EMS reports family called for non-productive cough and weakness since Jan.2. Family reports diarrhea and emesis last week.

## 2023-01-08 NOTE — ED Provider Notes (Signed)
[facility] DEPT Provider Note   CSN: [id number] Arrival date & time: 01/08/23  1600     History  Chief Complaint  Patient presents with   Cough   Weakness    Dana Burns is a 89 y.o. female.  Patient is a 89 year old female with a history of diabetes, hypertension, GERD, CKD who is presenting today from home due to 7 days of nonproductive cough, hot and cold chills, anorexia with worsening weakness to where now she was barely able to get out of bed this morning to go to the bathroom and thinks she probably can even stand at this point.  She had a few episodes of vomiting on Thursday of last week and has had occasional diarrhea.  She reports her chest and abdomen hurt from coughing but it is a little bit worse on the right.  She denies any swelling in her legs, change of medications, urinary symptoms.  She did receive a flu shot this year but is unsure if she had the recent COVID vaccines.  The history is provided by the patient.  Cough Weakness Associated symptoms: cough        Home Medications Prior to Admission medications   Medication Sig Start Date End Date Taking? Authorizing Provider  acetaminophen (TYLENOL) 500 MG tablet Take 500 mg by mouth every 6 (six) hours as needed for pain.    [provider]  calcium carbonate (OS-CAL) 600 MG TABS Take 600 mg by mouth 2 (two) times daily with a meal.    [provider]  Cholecalciferol (VITAMIN D3) 50 MCG (2000 UT) capsule Take 2,000 Units by mouth daily.     [provider]  citalopram (CELEXA) 20 MG tablet TAKE 1 TABLET BY MOUTH EVERY DAY 07/02/22   Mosie Lukes, MD  Cyanocobalamin (B-12 IJ) Place 1,000 mcg under the tongue daily.    [provider]  diazepam (VALIUM) 5 MG tablet TAKE 1/2 TO 1 TABLET DAILY AS NEEDED FOR ANXIETY 11/20/22   Mosie Lukes, MD  doxycycline (VIBRAMYCIN) 100 MG capsule Take 1 capsule (100 mg total) by mouth 2 (two) times daily.  01/07/23   Copland, Gay Filler, MD  famotidine (PEPCID) 40 MG tablet Take 1 tablet (40 mg total) by mouth at bedtime. 11/20/22   Mosie Lukes, MD  gabapentin (NEURONTIN) 800 MG tablet Take 1 tablet (800 mg total) by mouth 3 (three) times daily. 11/20/22   Mosie Lukes, MD  HYDROcodone bit-homatropine (HYCODAN) 5-1.5 MG/5ML syrup Take 5 mLs by mouth every 8 (eight) hours as needed for cough. 12/03/22   Shelda Pal, DO  HYDROcodone-acetaminophen (NORCO) 5-325 MG tablet Take 1 tablet by mouth 3 (three) times daily as needed for moderate pain. 11/20/22   Mosie Lukes, MD  ondansetron (ZOFRAN-ODT) 4 MG disintegrating tablet Take 1 tablet (4 mg total) by mouth every 8 (eight) hours as needed for nausea or vomiting. 12/03/22   Shelda Pal, DO  pantoprazole (PROTONIX) 40 MG tablet Take 1 tablet (40 mg total) by mouth daily. 04/30/22   Terrilyn Saver, NP  rOPINIRole (REQUIP) 1 MG tablet Take 1 tablet (1 mg total) by mouth at bedtime. 08/14/22   Mosie Lukes, MD  tiZANidine (ZANAFLEX) 4 MG tablet Take 1 tablet (4 mg total) by mouth 2 (two) times daily as needed for muscle spasms. 08/14/22   Mosie Lukes, MD  venlafaxine XR (EFFEXOR-XR) 37.5 MG 24 hr capsule Take 1 capsule (37.5 mg  total) by mouth daily with breakfast. 08/14/22   Mosie Lukes, MD      Allergies    Tape, Actos [pioglitazone hydrochloride], Buspar [buspirone hcl], Lipitor [atorvastatin calcium], and Penicillins    Review of Systems   Review of Systems  Respiratory:  Positive for cough.   Neurological:  Positive for weakness.    Physical Exam Updated Vital Signs BP (!) 161/81 (BP Location: Right Arm)   Pulse 88   Temp 98.2 F (36.8 C)   Resp 19   SpO2 (!) 89%  Physical Exam Vitals and nursing note reviewed.  Constitutional:      General: She is not in acute distress.    Appearance: She is well-developed. She is ill-appearing.  HENT:     Head: Normocephalic and atraumatic.     Mouth/Throat:      Mouth: Mucous membranes are dry.  Eyes:     Pupils: Pupils are equal, round, and reactive to light.  Cardiovascular:     Rate and Rhythm: Regular rhythm. Tachycardia present.     Pulses: Normal pulses.     Heart sounds: Normal heart sounds. No murmur heard.    No friction rub.  Pulmonary:     Effort: Pulmonary effort is normal. Tachypnea present.     Breath sounds: Examination of the right-middle field reveals rhonchi. Examination of the right-lower field reveals rhonchi. Rhonchi present. No wheezing or rales.  Abdominal:     General: Bowel sounds are normal. There is no distension.     Palpations: Abdomen is soft.     Tenderness: There is no abdominal tenderness. There is no guarding or rebound.  Musculoskeletal:        General: No tenderness. Normal range of motion.     Cervical back: Normal range of motion and neck supple.     Right lower leg: No edema.     Left lower leg: No edema.     Comments: No edema  Skin:    General: Skin is warm and dry.     Findings: No rash.  Neurological:     Mental Status: She is alert and oriented to person, place, and time. Mental status is at baseline.     Cranial Nerves: No cranial nerve deficit.  Psychiatric:        Mood and Affect: Mood normal.        Behavior: Behavior normal.     ED Results / Procedures / Treatments   Labs (all labs ordered are listed, but only abnormal results are displayed) Labs Reviewed  RESP PANEL BY RT-PCR (RSV, FLU A&B, COVID)  RVPGX2 - Abnormal; Notable for the following components:      Result Value   Resp Syncytial Virus by PCR POSITIVE (*)    All other components within normal limits  CBC WITH DIFFERENTIAL/PLATELET - Abnormal; Notable for the following components:   RBC 5.32 (*)    All other components within normal limits  COMPREHENSIVE METABOLIC PANEL - Abnormal; Notable for the following components:   Glucose, Bld 124 (*)    BUN 30 (*)    Creatinine, Ser 1.16 (*)    GFR, Estimated 44 (*)    All  other components within normal limits  LIPASE, BLOOD  LACTIC ACID, PLASMA  LACTIC ACID, PLASMA  TROPONIN I (HIGH SENSITIVITY)  TROPONIN I (HIGH SENSITIVITY)    EKG EKG Interpretation  Date/Time:  Tuesday January 08 2023 17:13:13 EST Ventricular Rate:  90 PR Interval:  217 QRS Duration: 124 QT Interval:  367 QTC Calculation: 449 R Axis:   -70 Text Interpretation: Sinus rhythm Ventricular premature complex Borderline prolonged PR interval RBBB and LAFB Left ventricular hypertrophy No significant change since last tracing Confirmed by Blanchie Dessert [phone]) on 01/08/2023 5:26:36 PM  Radiology DG Chest Port 1 View  Result Date: 01/08/2023 CLINICAL DATA:  Cough EXAM: PORTABLE CHEST 1 VIEW COMPARISON:  10/14/2018 FINDINGS: Cardiac silhouette is prominent. Bibasilar subsegmental atelectasis. Lungs are otherwise clear. Normal pulmonary vasculature. Calcified aorta. IMPRESSION: Basilar subsegmental atelectasis or scarring. Prominent cardiac silhouette. Electronically Signed   By: Sammie Bench M.D.   On: 01/08/2023 17:04    Procedures Procedures    Medications Ordered in ED Medications  albuterol (VENTOLIN HFA) 108 (90 Base) MCG/ACT inhaler 2 puff (has no administration in time range)  methylPREDNISolone sodium succinate (SOLU-MEDROL) 125 mg/2 mL injection 125 mg (has no administration in time range)  acetaminophen (TYLENOL) tablet 1,000 mg (has no administration in time range)  lactated ringers bolus 500 mL (0 mLs Intravenous Stopped 01/08/23 1858)  ondansetron (ZOFRAN) injection 4 mg (4 mg Intravenous Given 01/08/23 1644)    ED Course/ Medical Decision Making/ A&P                           Medical Decision Making Amount and/or Complexity of Data Reviewed Independent Historian: EMS External Data Reviewed: notes. Labs: ordered. Decision-making details documented in ED Course. Radiology: ordered and independent interpretation performed. Decision-making details documented in ED  Course. ECG/medicine tests: ordered and independent interpretation performed. Decision-making details documented in ED Course.  Risk Prescription drug management. Decision regarding hospitalization.   Pt with multiple medical problems and comorbidities and presenting today with a complaint that caries a high risk for morbidity and mortality.  Here today with URI type symptoms over the last 7 days that are gradually worsening.  Concern for viral etiology which has now turned into possible pneumonia.  No evidence of septic shock at this time.  She has no focal abdominal tenderness concerning for cholecystitis, pancreatitis or appendicitis.  She denies any urinary symptoms at this time.  Low suspicion for CHF as patient's exam does not have any findings concerning for fluid overload.  Also concern for dehydration and AKI.  Patient given a fluid bolus, labs and imaging are pending.  7:00 PM I independently interpreted patient's labs and EKG.  EKG was sinus tachycardia but no other acute findings, CBC, CMP, troponin, lipase and lactate are all within normal limits.  Patient did test positive for RSV.  I have independently visualized and interpreted pt's images today.  Chest x-ray without obvious pneumonia.  Patient given 500 of fluid but still complaining of feeling weak.  On repeat exam patient is hypoxic in the high 80s.  Now having more diffuse pronounced wheezing.  Feel the RSV is most likely the cause of this.  Patient given albuterol and Solu-Medrol.  However speaking with family that is in the room patient is having a hard time getting up and even going to the bathroom.  Now reporting feeling some shortness of breath and she was placed on 3 L of oxygen with sats improving to 92 to 93%.  Feel that patient would benefit from admission for ongoing oxygen therapy respiratory intervention prior to discharge home.  Antibiotics held at this time as there is no obvious signs of pneumonia.  Findings discussed  with the patient and her grandson and they are comfortable with this plan.  Final Clinical Impression(s) / ED Diagnoses Final diagnoses:  Acute respiratory failure with hypoxia (HCC)  RSV (acute bronchiolitis due to respiratory syncytial virus)  Weakness    Rx / DC Orders ED Discharge Orders     None         Blanchie Dessert, MD 01/08/23 1900

## 2023-01-08 NOTE — Assessment & Plan Note (Signed)
COPD pathway Steroids Scheduled and PRN nebs Can't ribavirin due to GFR just under 50 Holding off on ABx for the moment until/unless we get higher suspicion of superimposed bacterial PNA: WBC nl CXR = no PNA Check procalcitonin Check CT chest non-contrast Cont pulse ox

## 2023-01-09 ENCOUNTER — Encounter (HOSPITAL_COMMUNITY): Payer: Self-pay | Admitting: Internal Medicine

## 2023-01-09 ENCOUNTER — Other Ambulatory Visit: Payer: Self-pay | Admitting: Family Medicine

## 2023-01-09 DIAGNOSIS — E1142 Type 2 diabetes mellitus with diabetic polyneuropathy: Secondary | ICD-10-CM | POA: Diagnosis not present

## 2023-01-09 DIAGNOSIS — J210 Acute bronchiolitis due to respiratory syncytial virus: Secondary | ICD-10-CM | POA: Diagnosis not present

## 2023-01-09 DIAGNOSIS — I10 Essential (primary) hypertension: Secondary | ICD-10-CM | POA: Diagnosis not present

## 2023-01-09 LAB — BASIC METABOLIC PANEL
Anion gap: 12 (ref 5–15)
BUN: 38 mg/dL — ABNORMAL HIGH (ref 8–23)
CO2: 23 mmol/L (ref 22–32)
Calcium: 9.3 mg/dL (ref 8.9–10.3)
Chloride: 103 mmol/L (ref 98–111)
Creatinine, Ser: 1.2 mg/dL — ABNORMAL HIGH (ref 0.44–1.00)
GFR, Estimated: 42 mL/min — ABNORMAL LOW (ref >60)
Glucose, Bld: 193 mg/dL — ABNORMAL HIGH (ref 70–99)
Potassium: 4.4 mmol/L (ref 3.5–5.1)
Sodium: 138 mmol/L (ref 135–145)

## 2023-01-09 LAB — CBC
HCT: 42.7 % (ref 36.0–46.0)
Hemoglobin: 13.3 g/dL (ref 12.0–15.0)
MCH: 27.4 pg (ref 26.0–34.0)
MCHC: 31.1 g/dL (ref 30.0–36.0)
MCV: 87.9 fL (ref 80.0–100.0)
Platelets: 238 10*3/uL (ref 150–400)
RBC: 4.86 MIL/uL (ref 3.87–5.11)
RDW: 14.6 % (ref 11.5–15.5)
WBC: 5.4 10*3/uL (ref 4.0–10.5)
nRBC: 0 % (ref 0.0–0.2)

## 2023-01-09 LAB — GLUCOSE, CAPILLARY
Glucose-Capillary: 160 mg/dL — ABNORMAL HIGH (ref 70–99)
Glucose-Capillary: 177 mg/dL — ABNORMAL HIGH (ref 70–99)
Glucose-Capillary: 122 mg/dL — ABNORMAL HIGH (ref 70–99)

## 2023-01-09 LAB — LACTIC ACID, PLASMA: Lactic Acid, Venous: 1.1 mmol/L (ref 0.5–1.9)

## 2023-01-09 LAB — TROPONIN I (HIGH SENSITIVITY): Troponin I (High Sensitivity): 14 ng/L (ref <18)

## 2023-01-09 LAB — CBG MONITORING, ED: Glucose-Capillary: 202 mg/dL — ABNORMAL HIGH (ref 70–99)

## 2023-01-09 MED ORDER — IPRATROPIUM-ALBUTEROL 20-100 MCG/ACT IN AERS: 2.0000 | INHALATION_SPRAY | Freq: Four times a day (QID) | RESPIRATORY_TRACT | Status: DC

## 2023-01-09 MED ORDER — ACETAMINOPHEN 325 MG PO TABS
650.0000 mg | ORAL_TABLET | ORAL | Status: DC | PRN
  Administered 2023-01-09 – 2023-01-16 (×9): 650 mg via ORAL
  Filled 2023-01-09 (×11): qty 2

## 2023-01-09 MED ORDER — AZITHROMYCIN 250 MG PO TABS
500.0000 mg | ORAL_TABLET | Freq: Every day | ORAL | Status: AC
  Administered 2023-01-09 – 2023-01-13 (×5): 500 mg via ORAL
  Filled 2023-01-09 (×5): qty 2

## 2023-01-09 MED ORDER — LACTATED RINGERS IV SOLN: INTRAVENOUS | Status: DC

## 2023-01-09 NOTE — Evaluation (Signed)
Physical Therapy Evaluation Patient Details Name: Dana Burns MRN: [id number] DOB: 11-18-1926 Today's Date: 01/09/2023  History of Present Illness  89 y.o. female with medical history significant of DM2, HTN, colon cancer, fibromyalgia, chronic pain.  Pt presents to ED with 7 day h/o nonproductive cough, fever, chills, anorexia, worsening generalized weakness. She reported a few episodes of vomiting on Thurs, occasional diarrhea. Dx: RSV bronchitis  Clinical Impression  Pt admitted with above diagnosis. Min A for bed mobility and to transfer to bedside commode, then to recliner with RW. Pt fatigued after transfers so was unable to tolerate ambulation, SpO2 97% on room air with activity. Pt lives with her daughter and walks with a cane at baseline. She is significantly deconditioned at present.  Pt currently with functional limitations due to the deficits listed below (see PT Problem List). Pt will benefit from skilled PT to increase their independence and safety with mobility to allow discharge to the venue listed below.          Recommendations for follow up therapy are one component of a multi-disciplinary discharge planning process, led by the attending physician.  Recommendations may be updated based on patient status, additional functional criteria and insurance authorization.  Follow Up Recommendations Home health PT      Assistance Recommended at Discharge Intermittent Supervision/Assistance  Patient can return home with the following  A little help with walking and/or transfers;A little help with bathing/dressing/bathroom;Assistance with cooking/housework;Assist for transportation;Help with stairs or ramp for entrance    Equipment Recommendations None recommended by PT  Recommendations for Other Services       Functional Status Assessment Patient has had a recent decline in their functional status and demonstrates the ability to make significant improvements in function in a  reasonable and predictable amount of time.     Precautions / Restrictions Precautions Precautions: Fall Precaution Comments: pt denies falls in past 6 months Restrictions Weight Bearing Restrictions: No      Mobility  Bed Mobility Overal bed mobility: Needs Assistance Bed Mobility: Supine to Sit     Supine to sit: Min assist     General bed mobility comments: min A to raise trunk    Transfers Overall transfer level: Needs assistance Equipment used: Rolling walker (2 wheels) Transfers: Sit to/from Stand, Bed to chair/wheelchair/BSC Sit to Stand: Min assist   Step pivot transfers: Min guard       General transfer comment: assist to rise/steady, pivoted to [facility] then to recliner with RW, pt fatigued after 2 transfers and could not tolerate ambulation, SpO2 97% on room air    Ambulation/Gait               General Gait Details: deferred 2* fatigue with SPT  Stairs            Wheelchair Mobility    Modified Rankin (Stroke Patients Only)       Balance Overall balance assessment: Needs assistance Sitting-balance support: Feet supported, No upper extremity supported Sitting balance-Leahy Scale: Good     Standing balance support: Bilateral upper extremity supported, During functional activity, Reliant on assistive device for balance Standing balance-Leahy Scale: Fair                               Pertinent Vitals/Pain Pain Assessment Pain Assessment: Faces Faces Pain Scale: Hurts little more Pain Location: headache Pain Descriptors / Indicators: Grimacing, Aching Pain Intervention(s): Limited activity within patient's tolerance, Monitored  during session (pt declined pain medication)    Home Living Family/patient expects to be discharged to:: Private residence Living Arrangements: Children Available Help at Discharge: Family;Available 24 hours/day   Home Access: Stairs to enter   Entrance Stairs-Number of Steps: 3   Home Layout:  One level Home Equipment: Conservation officer, nature (2 wheels);Cane - single point      Prior Function Prior Level of Function : Independent/Modified Independent             Mobility Comments: walks with a cane, denies h/o falls in past 6 months       Hand Dominance        Extremity/Trunk Assessment   Upper Extremity Assessment Upper Extremity Assessment: Defer to OT evaluation    Lower Extremity Assessment Lower Extremity Assessment: Generalized weakness;LLE deficits/detail;RLE deficits/detail RLE Deficits / Details: knee ext -4/5 RLE Sensation: WNL LLE Deficits / Details: knee ext -4/5 LLE Sensation: WNL    Cervical / Trunk Assessment Cervical / Trunk Assessment: Kyphotic  Communication   Communication: HOH  Cognition Arousal/Alertness: Awake/alert Behavior During Therapy: WFL for tasks assessed/performed Overall Cognitive Status: Within Functional Limits for tasks assessed                                          General Comments      Exercises     Assessment/Plan    PT Assessment Patient needs continued PT services  PT Problem List Decreased activity tolerance;Decreased mobility;Decreased strength;Pain;Decreased balance       PT Treatment Interventions Gait training;Therapeutic exercise;Patient/family education;Therapeutic activities;Functional mobility training;DME instruction    PT Goals (Current goals can be found in the Care Plan section)  Acute Rehab PT Goals Patient Stated Goal: to get strong enough to go home PT Goal Formulation: With patient Time For Goal Achievement: 01/23/23 Potential to Achieve Goals: Good    Frequency Min 3X/week     Co-evaluation               AM-PAC PT "6 Clicks" Mobility  Outcome Measure Help needed turning from your back to your side while in a flat bed without using bedrails?: None Help needed moving from lying on your back to sitting on the side of a flat bed without using bedrails?: A  Little Help needed moving to and from a bed to a chair (including a wheelchair)?: A Little Help needed standing up from a chair using your arms (e.g., wheelchair or bedside chair)?: A Little Help needed to walk in hospital room?: A Lot Help needed climbing 3-5 steps with a railing? : Total 6 Click Score: 16    End of Session Equipment Utilized During Treatment: Gait belt Activity Tolerance: Patient limited by fatigue Patient left: in chair;with call bell/phone within reach (no chair alarm box in room, NT notified) Nurse Communication: Mobility status PT Visit Diagnosis: Difficulty in walking, not elsewhere classified (R26.2);Other abnormalities of gait and mobility (R26.89);Pain    Time: 8325-4982 PT Time Calculation (min) (ACUTE ONLY): 32 min   Charges:   PT Evaluation $PT Eval Moderate Complexity: 1 Mod PT Treatments $Gait Training: 8-22 mins        Blondell Reveal Kistler PT 01/09/2023  Acute Rehabilitation Services  Office [phone]

## 2023-01-09 NOTE — ED Notes (Signed)
Called grandson to let him know she was going upstairs.

## 2023-01-09 NOTE — TOC Initial Note (Signed)
Transition of Care [facility]) - Initial/Assessment Note   Patient Details  Name: Dana Burns MRN: [id number] Date of Birth: 10/23/1926  Transition of [facility]) CM/SW Contact:    Sherie Don, LCSW Phone Number: 01/09/2023, 3:55 PM  Clinical Narrative: PT evaluation recommended HHPT; waiting OT evaluation. CSW spoke with daughter, Dana Burns, regarding recommendation. Daughter agreeable to [facility] referral. CSW made HHPT referral to [facility] with [facility] and is aware HHOT may also be recommended. CSW updated daughter.  Expected Discharge Plan: [place] Barriers to Discharge: Continued Medical Work up  Patient Goals and CMS Choice Patient states their goals for this hospitalization and ongoing recovery are:: Return home CMS Medicare.gov Compare Post Acute Care list provided to:: Patient Represenative (must comment) (Dana Burns (daughter)) Choice offered to / list presented to : Adult Children  Expected Discharge Plan and Services In-house Referral: Clinical Social Work Post Acute Care Choice: [place] arrangements for the past 2 months: Single Family Home           DME Arranged: N/A DME Agency: NA HH Arranged: PT [place] Agency: [facility] ([facility]) Date [place]: 01/09/23 Time [place]: [place] Representative spoke with at [place]: [place] Arrangements/Services Living arrangements for the past 2 months: [place] Patient language and need for interpreter reviewed:: Yes Need for Family Participation in Patient Care: Yes (Comment) Care giver support system in place?: Yes (comment) Criminal Activity/Legal Involvement Pertinent to Current Situation/Hospitalization: No - Comment as needed  Permission Sought/Granted Permission sought to share information with : Other (comment) Permission granted to share information with : Yes, Verbal Permission Granted Permission granted to share info w AGENCY: [place]  agencies  Emotional Assessment Orientation: : Oriented to Self, Oriented to Place Alcohol / Substance Use: Not Applicable Psych Involvement: No (comment)  Admission diagnosis:  RSV (acute bronchiolitis due to respiratory syncytial virus) [J21.0] Weakness [R53.1] Acute respiratory failure with hypoxia ([place]) [J96.01] RSV bronchiolitis [J21.0] Patient Active Problem List   Diagnosis Date Noted   RSV bronchiolitis 01/08/2023   RLS (restless legs syndrome) 11/21/2022   Foot pain, bilateral 01/22/2022   Skin lesion 01/22/2022   Diminished pulses in lower extremity 01/22/2022   Urinary hesitancy 10/13/2021   Palpitations 07/18/2021   Allergy 07/17/2021   Colon cancer ([place]) 59/47/0761   Complication of anesthesia 07/17/2021   Depression 07/17/2021   H/O echocardiogram 07/17/2021   Headache 07/17/2021   Falls 07/15/2021   Abdominal pain 09/29/2017   Constipation 09/29/2017   Pain in finger of right hand 05/15/2017   Chronic pain syndrome 01/23/2015   Vitamin B 12 deficiency 07/07/2014   Pain of right shoulder region 07/07/2014   Chicken pox    Measles    Peripheral neuropathy 07/01/2014   Loss of weight 12/14/2013   Osteopenia 12/14/2013   Anxiety state 09/03/2013   Pain in joint, lower leg 09/03/2013   Fibromyalgia 06/01/2013   Breast mass, right 10/14/2012   Type 2 diabetes mellitus with peripheral neuropathy ([place]) 03/17/2011   Osteoarthritis (arthritis due to wear and tear of joints) 03/17/2011   Diabetic neuropathy ([place]) 03/17/2011   Chronic renal insufficiency 03/17/2011   History of colon cancer 03/17/2011   GERD (gastroesophageal reflux disease) 03/17/2011   Ductal hyperplasia of breast 03/17/2011   Essential hypertension, benign 05/31/2010   Tachycardia 05/31/2010   SOB (shortness of breath) 05/31/2010   PCP:  Mosie Lukes, MD Pharmacy:   [id number] [address] [place] -  [place] [address] Phone: [phone] Fax:  [phone]  Social Determinants of Health (SDOH) Social History: SDOH Screenings   Food Insecurity: No Food Insecurity (03/01/2022)  Transportation Needs: No Transportation Needs (03/01/2022)  Alcohol Screen: Low Risk  (03/01/2022)  Depression (PHQ2-9): Low Risk  (01/07/2023)  Financial Resource Strain: Low Risk  (03/01/2022)  Physical Activity: Inactive (03/01/2022)  Stress: No Stress Concern Present (03/01/2022)  Tobacco Use: Low Risk  (12/03/2022)   SDOH Interventions:    Readmission Risk Interventions     No data to display

## 2023-01-09 NOTE — Progress Notes (Signed)
OT Cancellation Note  Patient Details Name: Dana Burns MRN: [id number] DOB: Apr 04, 1926   Cancelled Treatment:    Reason Eval/Treat Not Completed: Patient at procedure or test/ unavailable  Lorcan Shelp L Hether Anselmo 01/09/2023, 4:19 PM

## 2023-01-09 NOTE — Progress Notes (Signed)
[facility]                                                                               Dana Burns, is a 89 y.o. female, DOB - 02-29-1926, UTM:[id number] Admit date - 01/08/2023    Outpatient Primary MD for the patient is Dana Lukes, MD  LOS - 0  days    Brief summary   Dana Burns is a 89 y.o. female with medical history significant of DM2, HTN.   Pt presents to ED with 7 day h/o nonproductive cough, fever, chills, anorexia, worsening generalized weakness.  She was admitted for RSV bronchiolitis.     Assessment & Plan    Assessment and Plan: * RSV bronchiolitis Currently on oxygen, try to wean her off oxygen.  Continue with steroids.  CT chest showing atypical infection. Started her on azithromycin.   Type 2 diabetes mellitus with peripheral neuropathy (HCC)  Started her on SSI.   Essential hypertension, benign Well controlled.    PT eval recommending HOME health PT.         Estimated body mass index is 22.6 kg/m as calculated from the following:   Height as of 11/20/22: '5\' 6"'$  (1.676 m).   Weight as of 11/20/22: 63.5 kg.  Code Status: DNR  DVT Prophylaxis:  enoxaparin (LOVENOX) injection 40 mg Start: 01/09/23 1000   Level of Care: Level of care: Med-Surg Family Communication: none at bedside.   Disposition Plan:     Remains inpatient appropriate:  pending improvement   Procedures:  \none.   Consultants:   None.   Antimicrobials:   Anti-infectives (From admission, onward)    Start     Dose/Rate Route Frequency Ordered Stop   01/09/23 1045  azithromycin (ZITHROMAX) tablet 500 mg        500 mg Oral Daily 01/09/23 0958 01/14/23 0959        Medications  Scheduled Meds:  azithromycin  500 mg Oral Daily   enoxaparin (LOVENOX) injection  40 mg Subcutaneous Q24H   insulin aspart  0-15 Units Subcutaneous TID WC   mometasone-formoterol  2 puff Inhalation BID   predniSONE  40 mg Oral Q breakfast   umeclidinium bromide  1  puff Inhalation Daily   Continuous Infusions: PRN Meds:.levalbuterol    Subjective:   Dana Burns was seen and examined today.   Wants to drink water. No chest pain , breathing has improved.   Objective:   Vitals:   01/09/23 0700 01/09/23 0759 01/09/23 1114 01/09/23 1158  BP: 131/64 (!) 149/78 (!) 151/68 (!) 160/68  Pulse: 79 85 78 79  Resp: (!) 23 (!) '23 18 17  '$ Temp:  97.8 F (36.6 C)  97.8 F (36.6 C)  TempSrc:  Oral  Oral  SpO2: 95% 95% 99% 94%    Intake/Output Summary (Last 24 hours) at 01/09/2023 1404 Last data filed at 01/09/2023 1256 Gross per 24 hour  Intake 180 ml  Output --  Net 180 ml   There were no vitals filed for this visit.   Exam General: Alert and oriented x 3, NAD Cardiovascular: S1 S2 auscultated, no murmurs, RRR Respiratory:  SCATTERED wheezing bilateral.  Gastrointestinal: Soft, nontender, nondistended, + bowel sounds Ext: no pedal edema bilaterally Neuro: AAOx3, Cr N's II- XII. Strength 5/5 upper and lower extremities bilaterally Skin: No rashes Psych: Normal affect and demeanor, alert and oriented x3    Data Reviewed:  I have personally reviewed following labs and imaging studies   CBC Lab Results  Component Value Date   WBC 5.4 01/09/2023   RBC 4.86 01/09/2023   HGB 13.3 01/09/2023   HCT 42.7 01/09/2023   MCV 87.9 01/09/2023   MCH 27.4 01/09/2023   PLT 238 01/09/2023   MCHC 31.1 01/09/2023   RDW 14.6 01/09/2023   LYMPHSABS 0.7 01/08/2023   MONOABS 0.7 01/08/2023   EOSABS 0.0 01/08/2023   BASOSABS 0.0 97/67/3419     Last metabolic panel Lab Results  Component Value Date   NA 138 01/09/2023   K 4.4 01/09/2023   CL 103 01/09/2023   CO2 23 01/09/2023   BUN 38 (H) 01/09/2023   CREATININE 1.20 (H) 01/09/2023   GLUCOSE 193 (H) 01/09/2023   GFRNONAA 42 (L) 01/09/2023   GFRAA 37 (L) 11/13/2016   CALCIUM 9.3 01/09/2023   PHOS 3.4 09/07/2014   PROT 7.8 01/08/2023   ALBUMIN 3.9 01/08/2023   LABGLOB 2.2 06/21/2014    AGRATIO 2.0 06/21/2014   BILITOT 1.0 01/08/2023   ALKPHOS 53 01/08/2023   AST 26 01/08/2023   ALT 14 01/08/2023   ANIONGAP 12 01/09/2023    CBG (last 3)  Recent Labs    01/09/23 0755 01/09/23 1152  GLUCAP 202* 160*      Coagulation Profile: No results for input(s): "INR", "PROTIME" in the last 168 hours.   Radiology Studies: CT CHEST WO CONTRAST  Result Date: 01/08/2023 CLINICAL DATA:  Cough weakness EXAM: CT CHEST WITHOUT CONTRAST TECHNIQUE: Multidetector CT imaging of the chest was performed following the standard protocol without IV contrast. RADIATION DOSE REDUCTION: This exam was performed according to the departmental dose-optimization program which includes automated exposure control, adjustment of the mA and/or kV according to patient size and/or use of iterative reconstruction technique. COMPARISON:  Chest x-ray in 01/08/2023 FINDINGS: Cardiovascular: Limited assessment without intravenous contrast. Moderate aortic atherosclerosis. No aneurysm. Coronary vascular calcification. Upper normal cardiac size. No pericardial effusion. Mediastinum/Nodes: Midline trachea. No thyroid mass. Subcentimeter mediastinal lymph nodes. Moderate hiatal hernia. Lungs/Pleura: Mild apical scarring. Scattered areas of mild bronchiectasis within the upper lobes and right middle lobe. 4 mm nodule left lung base, series 5, image 107. Other punctate nodules left lung base. Suspicion of minimal tree-in-bud density at the left base and lingula but limited by motion degradation. Upper Abdomen: No acute abnormality. Musculoskeletal: No acute osseous abnormality. IMPRESSION: 1. Scattered areas of mild bronchiectasis within the upper lobes and right middle lobe. Suspicion of minimal tree-in-bud density at the left base and lingula but limited by motion degradation, suspect for mild respiratory infection, potentially due to atypical organism. 2. Moderate hiatal hernia. 3. Small left lower lobe pulmonary nodules  measuring up to 4 mm. No follow-up needed if patient is low-risk (and has no known or suspected primary neoplasm). Non-contrast chest CT can be considered in 12 months if patient is high-risk. This recommendation follows the consensus statement: Guidelines for Management of Incidental Pulmonary Nodules Detected on CT Images: From the Fleischner Society 2017; Radiology 2017; 284:228-243. 4. Aortic atherosclerosis. Aortic Atherosclerosis (ICD10-I70.0). Electronically Signed   By: Donavan Foil M.D.   On: 01/08/2023 22:49   DG Chest Port 1 View  Result Date: 01/08/2023 CLINICAL DATA:  Cough EXAM: PORTABLE CHEST 1 VIEW COMPARISON:  10/14/2018 FINDINGS: Cardiac silhouette is prominent. Bibasilar subsegmental atelectasis. Lungs are otherwise clear. Normal pulmonary vasculature. Calcified aorta. IMPRESSION: Basilar subsegmental atelectasis or scarring. Prominent cardiac silhouette. Electronically Signed   By: Sammie Bench M.D.   On: 01/08/2023 17:04       Hosie Poisson M.D. [facility] 01/09/2023, 2:04 PM  Available via Epic secure chat 7am-7pm After 7 pm, please refer to night coverage provider listed on amion.

## 2023-01-09 NOTE — ED Notes (Signed)
Checked on pt she was sleeping , provided grandson with a recliner

## 2023-01-09 NOTE — ED Notes (Signed)
ED TO INPATIENT HANDOFF REPORT  ED Nurse Name and Phone #: Baxter Flattery, RN  S Name/Age/Gender Dana Burns 89 y.o. female Room/Bed: WA22/WA22  Code Status   Code Status: DNR  Home/SNF/Other Home Patient oriented to: self Is this baseline? Yes   Triage Complete: Triage complete  Chief Complaint RSV bronchiolitis [J21.0]  Triage Note EMS reports family called for non-productive cough and weakness since Jan.2. Family reports diarrhea and emesis last week.   Allergies Allergies  Allergen Reactions   Tape Itching and Rash   Actos [Pioglitazone Hydrochloride] Other (See Comments)    Unknown reaction   Buspar [Buspirone Hcl] Other (See Comments)    Unknown reaction   Doxycycline Other (See Comments)    Made the patient "feel funny"- in a negative way   Lipitor [Atorvastatin Calcium] Other (See Comments)    "It made me hurt all over."   Penicillins Rash    Level of Care/Admitting Diagnosis ED Disposition     ED Disposition  Admit   Condition  --   [place]: [place] [[id number]]  Level of Care: Med-Surg [16]  May place patient in observation at [facility] or [place] if equivalent level of care is available:: No  Covid Evaluation: Confirmed COVID Negative  Diagnosis: RSV bronchiolitis [[id number]]  Admitting Physician: Etta Quill [[id number]]  Attending Physician: Etta Quill [[id number]]          B Medical/Surgery History Past Medical History:  Diagnosis Date   Abdominal pain Oct 21, 2017   Allergy    sneeze, rhinorrhea in am   Anxiety state 09/03/2013   Breast mass, right 10/14/2012   Chicken pox as a child   Chronic pain syndrome 01/23/2015   Chronic renal insufficiency 03/17/2011   Colon cancer ([place])    Complication of anesthesia    agitated when waking up, wakes up shaking, "like  I'm going into shock"   Constipation October 21, 2017   Depression    husband died  3 months ago, pt. admits depression  currently    Diabetic  neuropathy ([place]) 03/17/2011   DM (diabetes mellitus) type 2, uncontrolled, with ketoacidosis ([place]) 03/17/2011   Ductal hyperplasia of breast 03/17/2011   Essential hypertension, benign 05/31/2010   Qualifier: Diagnosis of  By: Percival Spanish, MD, Bethann Goo 07/15/2021   Fibromyalgia    GERD (gastroesophageal reflux disease)    H/O echocardiogram    done /w [place] in 2011, saw Dr. Percival Spanish for tachycardia    Headache    History of colon cancer 03/17/2011   Hyperlipidemia    6 years   Hyperlipidemia, mixed    6 years   Hypertension    15 years, reports she has a rapid heartrate    Loss of weight 12/14/2013   Measles as a child   Osteoarthritis (arthritis due to wear and tear of joints) 03/17/2011   Noted diffusely including neck   Osteopenia 12/14/2013   Pain in finger of right hand 05/15/2017   Pain in joint, lower leg 09/03/2013   Pain of right shoulder region 07/07/2014   Peripheral neuropathy    SOB (shortness of breath) 05/31/2010   Qualifier: Diagnosis of  By: Percival Spanish, MD, Farrel Gordon     Stress 06/01/2013   Tachycardia 05/31/2010   Qualifier: Diagnosis of  By: Percival Spanish, MD, Farrel Gordon     Type 2 diabetes mellitus with peripheral neuropathy ([place]) 03/17/2011   Vitamin B 12 deficiency 07/07/2014   intrinsic factor positive  Past Surgical History:  Procedure Laterality Date   BREAST EXCISIONAL BIOPSY Right 2013   benign   BREAST SURGERY  2013   right- benign   CATARACT EXTRACTION     /w IOL- bilateral    COLON SURGERY     For resection of cancer   Nodule removed     From throat     A IV Location/Drains/Wounds Patient Lines/Drains/Airways Status     Active Line/Drains/Airways     Name Placement date Placement time Site Days   Peripheral IV 01/09/23 20 G Left Antecubital 01/09/23  0543  Antecubital  less than 1   Incision 10/17/12 Breast Right 10/17/12  1154  -- 3736            Intake/Output Last 24 hours No intake or output data in the 24 hours ending  01/09/23 0743  Labs/Imaging Results for orders placed or performed during the hospital encounter of 01/08/23 (from the past 48 hour(s))  CBC with Differential/Platelet     Status: Abnormal   Collection Time: 01/08/23  4:19 PM  Result Value Ref Range   WBC 6.8 4.0 - 10.5 K/uL   RBC 5.32 (H) 3.87 - 5.11 MIL/uL   Hemoglobin 14.4 12.0 - 15.0 g/dL   HCT 45.6 36.0 - 46.0 %   MCV 85.7 80.0 - 100.0 fL   MCH 27.1 26.0 - 34.0 pg   MCHC 31.6 30.0 - 36.0 g/dL   RDW 14.6 11.5 - 15.5 %   Platelets 256 150 - 400 K/uL    Comment: REPEATED TO VERIFY   nRBC 0.0 0.0 - 0.2 %   Neutrophils Relative % 78 %   Neutro Abs 5.3 1.7 - 7.7 K/uL   Lymphocytes Relative 11 %   Lymphs Abs 0.7 0.7 - 4.0 K/uL   Monocytes Relative 11 %   Monocytes Absolute 0.7 0.1 - 1.0 K/uL   Eosinophils Relative 0 %   Eosinophils Absolute 0.0 0.0 - 0.5 K/uL   Basophils Relative 0 %   Basophils Absolute 0.0 0.0 - 0.1 K/uL   Immature Granulocytes 0 %   Abs Immature Granulocytes 0.02 0.00 - 0.07 K/uL    Comment: Performed at [facility], [place] [address] [place], [place] [address]  Comprehensive metabolic panel     Status: Abnormal   Collection Time: 01/08/23  4:19 PM  Result Value Ref Range   Sodium 138 135 - 145 mmol/L   Potassium 4.3 3.5 - 5.1 mmol/L    Comment: HEMOLYSIS AT THIS LEVEL MAY AFFECT RESULT   Chloride 103 98 - 111 mmol/L   CO2 22 22 - 32 mmol/L   Glucose, Bld 124 (H) 70 - 99 mg/dL    Comment: Glucose reference range applies only to samples taken after fasting for at least 8 hours.   BUN 30 (H) 8 - 23 mg/dL   Creatinine, Ser 1.16 (H) 0.44 - 1.00 mg/dL   Calcium 9.5 8.9 - 10.3 mg/dL   Total Protein 7.8 6.5 - 8.1 g/dL   Albumin 3.9 3.5 - 5.0 g/dL   AST 26 15 - 41 U/L    Comment: HEMOLYSIS AT THIS LEVEL MAY AFFECT RESULT   ALT 14 0 - 44 U/L    Comment: HEMOLYSIS AT THIS LEVEL MAY AFFECT RESULT   Alkaline Phosphatase 53 38 - 126 U/L   Total Bilirubin 1.0 0.3 - 1.2 mg/dL    Comment:  HEMOLYSIS AT THIS LEVEL MAY AFFECT RESULT   GFR, Estimated 44 (L) >60 mL/min  Comment: (NOTE) Calculated using the CKD-EPI Creatinine Equation (2021)    Anion gap 13 5 - 15    Comment: Performed at [facility], [place] [address], [place], [place] [address]  Troponin I (High Sensitivity)     Status: None   Collection Time: 01/08/23  4:19 PM  Result Value Ref Range   Troponin I (High Sensitivity) 17 <18 ng/L    Comment: (NOTE) Elevated high sensitivity troponin I (hsTnI) values and significant  changes across serial measurements may suggest ACS but many other  chronic and acute conditions are known to elevate hsTnI results.  Refer to the "Links" section for chest pain algorithms and additional  guidance. Performed at [facility], [place] [address], [place], [place] [address]   Lipase, blood     Status: None   Collection Time: 01/08/23  4:19 PM  Result Value Ref Range   Lipase 30 11 - 51 U/L    Comment: Performed at [facility], [place] [address], [place], [place] [address]  Lactic acid, plasma     Status: None   Collection Time: 01/08/23  4:19 PM  Result Value Ref Range   Lactic Acid, Venous 1.5 0.5 - 1.9 mmol/L    Comment: Performed at [facility], [place] [address], [place], [place] [address]  Resp panel by RT-PCR (RSV, Flu A&B, Covid) Anterior Nasal Swab     Status: Abnormal   Collection Time: 01/08/23  4:19 PM   Specimen: Anterior Nasal Swab  Result Value Ref Range   SARS Coronavirus 2 by RT PCR NEGATIVE NEGATIVE    Comment: (NOTE) SARS-CoV-2 target nucleic acids are NOT DETECTED.  The SARS-CoV-2 RNA is generally detectable in upper respiratory specimens during the acute phase of infection. The lowest concentration of SARS-CoV-2 viral copies this assay can detect is 138 copies/mL. A negative result does not preclude SARS-Cov-2 infection and should not be used as the sole basis for treatment or other  patient management decisions. A negative result may occur with  improper specimen collection/handling, submission of specimen other than nasopharyngeal swab, presence of viral mutation(s) within the areas targeted by this assay, and inadequate number of viral copies(<138 copies/mL). A negative result must be combined with clinical observations, patient history, and epidemiological information. The expected result is Negative.  Fact Sheet for Patients:  EntrepreneurPulse.com.au  Fact Sheet for Healthcare Providers:  IncredibleEmployment.be  This test is no t yet approved or cleared by the [place] FDA and  has been authorized for detection and/or diagnosis of SARS-CoV-2 by FDA under an Emergency Use Authorization (EUA). This EUA will remain  in effect (meaning this test can be used) for the duration of the COVID-19 declaration under Section 564(b)(1) of the Act, 21 U.S.C.section 360bbb-3(b)(1), unless the authorization is terminated  or revoked sooner.       Influenza A by PCR NEGATIVE NEGATIVE   Influenza B by PCR NEGATIVE NEGATIVE    Comment: (NOTE) The Xpert Xpress SARS-CoV-2/FLU/RSV plus assay is intended as an aid in the diagnosis of influenza from Nasopharyngeal swab specimens and should not be used as a sole basis for treatment. Nasal washings and aspirates are unacceptable for Xpert Xpress SARS-CoV-2/FLU/RSV testing.  Fact Sheet for Patients: EntrepreneurPulse.com.au  Fact Sheet for Healthcare Providers: IncredibleEmployment.be  This test is not yet approved or cleared by the [place] FDA and has been authorized for detection and/or diagnosis of SARS-CoV-2 by FDA under an Emergency Use Authorization (EUA). This EUA will  remain in effect (meaning this test can be used) for the duration of the COVID-19 declaration under Section 564(b)(1) of the Act, 21 U.S.C. section 360bbb-3(b)(1), unless  the authorization is terminated or revoked.     Resp Syncytial Virus by PCR POSITIVE (A) NEGATIVE    Comment: (NOTE) Fact Sheet for Patients: EntrepreneurPulse.com.au  Fact Sheet for Healthcare Providers: IncredibleEmployment.be  This test is not yet approved or cleared by the [place] FDA and has been authorized for detection and/or diagnosis of SARS-CoV-2 by FDA under an Emergency Use Authorization (EUA). This EUA will remain in effect (meaning this test can be used) for the duration of the COVID-19 declaration under Section 564(b)(1) of the Act, 21 U.S.C. section 360bbb-3(b)(1), unless the authorization is terminated or revoked.  Performed at [facility], [place] [address] [place], [place] [address]   CBC     Status: None   Collection Time: 01/09/23  5:10 AM  Result Value Ref Range   WBC 5.4 4.0 - 10.5 K/uL   RBC 4.86 3.87 - 5.11 MIL/uL   Hemoglobin 13.3 12.0 - 15.0 g/dL   HCT 42.7 36.0 - 46.0 %   MCV 87.9 80.0 - 100.0 fL   MCH 27.4 26.0 - 34.0 pg   MCHC 31.1 30.0 - 36.0 g/dL   RDW 14.6 11.5 - 15.5 %   Platelets 238 150 - 400 K/uL   nRBC 0.0 0.0 - 0.2 %    Comment: Performed at [facility], [place] [address], [place], [place] [address]  Basic metabolic panel     Status: Abnormal   Collection Time: 01/09/23  5:10 AM  Result Value Ref Range   Sodium 138 135 - 145 mmol/L   Potassium 4.4 3.5 - 5.1 mmol/L   Chloride 103 98 - 111 mmol/L   CO2 23 22 - 32 mmol/L   Glucose, Bld 193 (H) 70 - 99 mg/dL    Comment: Glucose reference range applies only to samples taken after fasting for at least 8 hours.   BUN 38 (H) 8 - 23 mg/dL   Creatinine, Ser 1.20 (H) 0.44 - 1.00 mg/dL   Calcium 9.3 8.9 - 10.3 mg/dL   GFR, Estimated 42 (L) >60 mL/min    Comment: (NOTE) Calculated using the CKD-EPI Creatinine Equation (2021)    Anion gap 12 5 - 15    Comment: Performed at [facility], [place]  [address], [place], [place] [address]   CT CHEST WO CONTRAST  Result Date: 01/08/2023 CLINICAL DATA:  Cough weakness EXAM: CT CHEST WITHOUT CONTRAST TECHNIQUE: Multidetector CT imaging of the chest was performed following the standard protocol without IV contrast. RADIATION DOSE REDUCTION: This exam was performed according to the departmental dose-optimization program which includes automated exposure control, adjustment of the mA and/or kV according to patient size and/or use of iterative reconstruction technique. COMPARISON:  Chest x-ray in 01/08/2023 FINDINGS: Cardiovascular: Limited assessment without intravenous contrast. Moderate aortic atherosclerosis. No aneurysm. Coronary vascular calcification. Upper normal cardiac size. No pericardial effusion. Mediastinum/Nodes: Midline trachea. No thyroid mass. Subcentimeter mediastinal lymph nodes. Moderate hiatal hernia. Lungs/Pleura: Mild apical scarring. Scattered areas of mild bronchiectasis within the upper lobes and right middle lobe. 4 mm nodule left lung base, series 5, image 107. Other punctate nodules left lung base. Suspicion of minimal tree-in-bud density at the left base and lingula but limited by motion degradation. Upper Abdomen: No acute abnormality. Musculoskeletal: No acute osseous abnormality. IMPRESSION: 1. Scattered areas of mild bronchiectasis within  the upper lobes and right middle lobe. Suspicion of minimal tree-in-bud density at the left base and lingula but limited by motion degradation, suspect for mild respiratory infection, potentially due to atypical organism. 2. Moderate hiatal hernia. 3. Small left lower lobe pulmonary nodules measuring up to 4 mm. No follow-up needed if patient is low-risk (and has no known or suspected primary neoplasm). Non-contrast chest CT can be considered in 12 months if patient is high-risk. This recommendation follows the consensus statement: Guidelines for Management of Incidental Pulmonary Nodules Detected on  CT Images: From the Fleischner Society 2017; Radiology 2017; 284:228-243. 4. Aortic atherosclerosis. Aortic Atherosclerosis (ICD10-I70.0). Electronically Signed   By: Donavan Foil M.D.   On: 01/08/2023 22:49   DG Chest Port 1 View  Result Date: 01/08/2023 CLINICAL DATA:  Cough EXAM: PORTABLE CHEST 1 VIEW COMPARISON:  10/14/2018 FINDINGS: Cardiac silhouette is prominent. Bibasilar subsegmental atelectasis. Lungs are otherwise clear. Normal pulmonary vasculature. Calcified aorta. IMPRESSION: Basilar subsegmental atelectasis or scarring. Prominent cardiac silhouette. Electronically Signed   By: Sammie Bench M.D.   On: 01/08/2023 17:04    Pending Labs Unresulted Labs (From admission, onward)     Start     Ordered   01/08/23 2213  Procalcitonin - Baseline  ONCE - URGENT,   URGENT        01/08/23 2212   01/08/23 2156  HIV Antibody (routine testing w rflx)  (HIV Antibody (Routine testing w reflex) panel)  Once,   R        01/08/23 2200   01/08/23 1619  Lactic acid, plasma  Now then every 2 hours,   R (with STAT occurrences)      01/08/23 1619            Vitals/Pain Today's Vitals   01/09/23 0500 01/09/23 0516 01/09/23 0600 01/09/23 0700  BP: (!) 109/53  (!) 141/68 131/64  Pulse: 76  81 79  Resp: (!) 22  (!) 22 (!) 23  Temp:  97.8 F (36.6 C)    TempSrc:  Axillary    SpO2: 96%  99% 95%  PainSc:        Isolation Precautions No active isolations  Medications Medications  enoxaparin (LOVENOX) injection 40 mg (has no administration in time range)  predniSONE (DELTASONE) tablet 40 mg (has no administration in time range)  mometasone-formoterol (DULERA) 200-5 MCG/ACT inhaler 2 puff (2 puffs Inhalation Not Given 01/09/23 0152)  levalbuterol (XOPENEX) nebulizer solution 0.63 mg (has no administration in time range)  umeclidinium bromide (INCRUSE ELLIPTA) 62.5 MCG/ACT 1 puff (has no administration in time range)  insulin aspart (novoLOG) injection 0-15 Units (has no administration in  time range)  lactated ringers bolus 500 mL (0 mLs Intravenous Stopped 01/08/23 1858)  ondansetron (ZOFRAN) injection 4 mg (4 mg Intravenous Given 01/08/23 1644)  albuterol (VENTOLIN HFA) 108 (90 Base) MCG/ACT inhaler 2 puff (2 puffs Inhalation Given 01/08/23 1921)  methylPREDNISolone sodium succinate (SOLU-MEDROL) 125 mg/2 mL injection 125 mg (125 mg Intravenous Given 01/08/23 1920)  acetaminophen (TYLENOL) tablet 1,000 mg (1,000 mg Oral Given 01/08/23 1920)    Mobility walks with device Low fall risk   Focused Assessments Pulmonary Assessment Handoff:  Lung sounds: Bilateral Breath Sounds: Diminished 3L [place]      R Recommendations: See Admitting Provider Note  Report given to:   Additional Notes:

## 2023-01-10 DIAGNOSIS — E440 Moderate protein-calorie malnutrition: Secondary | ICD-10-CM | POA: Diagnosis present

## 2023-01-10 DIAGNOSIS — I4891 Unspecified atrial fibrillation: Secondary | ICD-10-CM | POA: Diagnosis present

## 2023-01-10 DIAGNOSIS — M797 Fibromyalgia: Secondary | ICD-10-CM | POA: Diagnosis present

## 2023-01-10 DIAGNOSIS — I10 Essential (primary) hypertension: Secondary | ICD-10-CM | POA: Diagnosis not present

## 2023-01-10 DIAGNOSIS — Z85038 Personal history of other malignant neoplasm of large intestine: Secondary | ICD-10-CM | POA: Diagnosis not present

## 2023-01-10 DIAGNOSIS — I4819 Other persistent atrial fibrillation: Secondary | ICD-10-CM | POA: Diagnosis not present

## 2023-01-10 DIAGNOSIS — E1122 Type 2 diabetes mellitus with diabetic chronic kidney disease: Secondary | ICD-10-CM | POA: Diagnosis present

## 2023-01-10 DIAGNOSIS — J440 Chronic obstructive pulmonary disease with acute lower respiratory infection: Secondary | ICD-10-CM | POA: Diagnosis present

## 2023-01-10 DIAGNOSIS — N189 Chronic kidney disease, unspecified: Secondary | ICD-10-CM | POA: Diagnosis present

## 2023-01-10 DIAGNOSIS — I130 Hypertensive heart and chronic kidney disease with heart failure and stage 1 through stage 4 chronic kidney disease, or unspecified chronic kidney disease: Secondary | ICD-10-CM | POA: Diagnosis present

## 2023-01-10 DIAGNOSIS — I5032 Chronic diastolic (congestive) heart failure: Secondary | ICD-10-CM | POA: Diagnosis present

## 2023-01-10 DIAGNOSIS — E43 Unspecified severe protein-calorie malnutrition: Secondary | ICD-10-CM | POA: Insufficient documentation

## 2023-01-10 DIAGNOSIS — J210 Acute bronchiolitis due to respiratory syncytial virus: Secondary | ICD-10-CM | POA: Diagnosis present

## 2023-01-10 DIAGNOSIS — K219 Gastro-esophageal reflux disease without esophagitis: Secondary | ICD-10-CM | POA: Diagnosis present

## 2023-01-10 DIAGNOSIS — J9601 Acute respiratory failure with hypoxia: Secondary | ICD-10-CM | POA: Diagnosis present

## 2023-01-10 DIAGNOSIS — N179 Acute kidney failure, unspecified: Secondary | ICD-10-CM | POA: Diagnosis present

## 2023-01-10 DIAGNOSIS — E782 Mixed hyperlipidemia: Secondary | ICD-10-CM | POA: Diagnosis present

## 2023-01-10 DIAGNOSIS — M6281 Muscle weakness (generalized): Secondary | ICD-10-CM | POA: Diagnosis not present

## 2023-01-10 DIAGNOSIS — Z1152 Encounter for screening for COVID-19: Secondary | ICD-10-CM | POA: Diagnosis not present

## 2023-01-10 DIAGNOSIS — E1142 Type 2 diabetes mellitus with diabetic polyneuropathy: Secondary | ICD-10-CM | POA: Diagnosis present

## 2023-01-10 DIAGNOSIS — F32A Depression, unspecified: Secondary | ICD-10-CM | POA: Diagnosis present

## 2023-01-10 DIAGNOSIS — Z800 Family history of malignant neoplasm of digestive organs: Secondary | ICD-10-CM | POA: Diagnosis not present

## 2023-01-10 DIAGNOSIS — I471 Supraventricular tachycardia, unspecified: Secondary | ICD-10-CM | POA: Diagnosis not present

## 2023-01-10 DIAGNOSIS — Z66 Do not resuscitate: Secondary | ICD-10-CM | POA: Diagnosis present

## 2023-01-10 DIAGNOSIS — R54 Age-related physical debility: Secondary | ICD-10-CM | POA: Diagnosis present

## 2023-01-10 DIAGNOSIS — Z825 Family history of asthma and other chronic lower respiratory diseases: Secondary | ICD-10-CM | POA: Diagnosis not present

## 2023-01-10 DIAGNOSIS — G2581 Restless legs syndrome: Secondary | ICD-10-CM | POA: Diagnosis present

## 2023-01-10 LAB — BASIC METABOLIC PANEL
Anion gap: 9 (ref 5–15)
BUN: 41 mg/dL — ABNORMAL HIGH (ref 8–23)
CO2: 23 mmol/L (ref 22–32)
Calcium: 9.1 mg/dL (ref 8.9–10.3)
Chloride: 105 mmol/L (ref 98–111)
Creatinine, Ser: 1.03 mg/dL — ABNORMAL HIGH (ref 0.44–1.00)
GFR, Estimated: 50 mL/min — ABNORMAL LOW (ref >60)
Glucose, Bld: 153 mg/dL — ABNORMAL HIGH (ref 70–99)
Potassium: 3.8 mmol/L (ref 3.5–5.1)
Sodium: 137 mmol/L (ref 135–145)

## 2023-01-10 LAB — GLUCOSE, CAPILLARY
Glucose-Capillary: 106 mg/dL — ABNORMAL HIGH (ref 70–99)
Glucose-Capillary: 118 mg/dL — ABNORMAL HIGH (ref 70–99)
Glucose-Capillary: 148 mg/dL — ABNORMAL HIGH (ref 70–99)
Glucose-Capillary: 122 mg/dL — ABNORMAL HIGH (ref 70–99)

## 2023-01-10 MED ORDER — GUAIFENESIN-DM 100-10 MG/5ML PO SYRP
10.0000 mL | ORAL_SOLUTION | ORAL | Status: DC | PRN
  Administered 2023-01-12: 10 mL via ORAL
  Filled 2023-01-10: qty 10

## 2023-01-10 MED ORDER — BENZONATATE 100 MG PO CAPS
200.0000 mg | ORAL_CAPSULE | Freq: Three times a day (TID) | ORAL | Status: DC | PRN
  Administered 2023-01-12 (×2): 200 mg via ORAL
  Filled 2023-01-10 (×2): qty 2

## 2023-01-10 MED ORDER — ENSURE ENLIVE PO LIQD
237.0000 mL | Freq: Two times a day (BID) | ORAL | Status: DC
  Administered 2023-01-10 – 2023-01-17 (×13): 237 mL via ORAL

## 2023-01-10 MED ORDER — METHYLPREDNISOLONE SODIUM SUCC 40 MG IJ SOLR
40.0000 mg | INTRAMUSCULAR | Status: DC
  Administered 2023-01-11 – 2023-01-12 (×2): 40 mg via INTRAVENOUS
  Filled 2023-01-10 (×2): qty 1

## 2023-01-10 MED ORDER — ADULT MULTIVITAMIN W/MINERALS CH
1.0000 | ORAL_TABLET | Freq: Every day | ORAL | Status: DC
  Administered 2023-01-10 – 2023-01-17 (×8): 1 via ORAL
  Filled 2023-01-10 (×8): qty 1

## 2023-01-10 MED ORDER — HYDRALAZINE HCL 25 MG PO TABS
25.0000 mg | ORAL_TABLET | Freq: Three times a day (TID) | ORAL | Status: DC | PRN
  Administered 2023-01-12: 25 mg via ORAL
  Filled 2023-01-10: qty 1

## 2023-01-10 NOTE — Progress Notes (Addendum)
[facility]                                                                               Dana Burns, is a 89 y.o. female, DOB - March 31, 1926, OMV:[id number] Admit date - 01/08/2023    Outpatient Primary MD for the patient is Mosie Lukes, MD  LOS - 0  days    Brief summary   Dana Burns is a 89 y.o. female with medical history significant of DM2, HTN.   Pt presents to ED with 7 day h/o nonproductive cough, fever, chills, anorexia, worsening generalized weakness.  She was admitted for RSV bronchiolitis.     Assessment & Plan    Assessment and Plan: * RSV bronchiolitis/ acute respiratory failure with hypoxia requiring about 2 to 3 lit of [place] oxygen.  On exam today, she is still diffusely wheezing , change prednisone to IV solumedrol.  CT chest showing atypical infection. Started her on azithromycin. Sputum cultures have been ordered.  Continue with duonebs and cough meds as needed.  Recommend weaning her oxygen off slowly in the next 24 hours.  Therapy evaluations have been ordered.   Type 2 diabetes mellitus with peripheral neuropathy (HCC)  Started her on SSI.  CBG (last 3)  Recent Labs    01/09/23 2139 01/10/23 0736 01/10/23 1137  GLUCAP 122* 106* 118*  Continue with SSI.  A1c is 6.8%   Essential hypertension, benign Not Well controlled. .  Added oral hydralazine prn.         Estimated body mass index is 22.6 kg/m as calculated from the following:   Height as of 11/20/22: '5\' 6"'$  (1.676 m).   Weight as of 11/20/22: 63.5 kg.  Code Status: DNR  DVT Prophylaxis:  enoxaparin (LOVENOX) injection 40 mg Start: 01/09/23 1000   Level of Care: Level of care: Med-Surg Family Communication: none at bedside.   Disposition Plan:     Remains inpatient appropriate:  pending improvement   Procedures:  \none.   Consultants:   None.   Antimicrobials:   Anti-infectives (From admission, onward)    Start     Dose/Rate Route Frequency Ordered Stop    01/09/23 1045  azithromycin (ZITHROMAX) tablet 500 mg        500 mg Oral Daily 01/09/23 0958 01/14/23 0959        Medications  Scheduled Meds:  azithromycin  500 mg Oral Daily   enoxaparin (LOVENOX) injection  40 mg Subcutaneous Q24H   feeding supplement  237 mL Oral BID BM   insulin aspart  0-15 Units Subcutaneous TID WC   [START ON 01/11/2023] methylPREDNISolone (SOLU-MEDROL) injection  40 mg Intravenous Q24H   mometasone-formoterol  2 puff Inhalation BID   multivitamin with minerals  1 tablet Oral Daily   umeclidinium bromide  1 puff Inhalation Daily   Continuous Infusions:  lactated ringers 75 mL/hr at 01/10/23 0415   PRN Meds:.acetaminophen, benzonatate, guaiFENesin-dextromethorphan, levalbuterol    Subjective:   Dana Burns was seen and examined today.   Coughing.   Objective:   Vitals:   01/10/23 0502 01/10/23 0936 01/10/23 0939 01/10/23 1141  BP: (!) 161/65   (!) 169/79  Pulse:  72   77  Resp: 17   18  Temp: 98 F (36.7 C)   97.9 F (36.6 C)  TempSrc: Oral   Oral  SpO2: 98% 97% 97% 99%    Intake/Output Summary (Last 24 hours) at 01/10/2023 1354 Last data filed at 01/10/2023 1141 Gross per 24 hour  Intake 1354.06 ml  Output 500 ml  Net 854.06 ml    There were no vitals filed for this visit.   Exam General exam: Appears calm and comfortable  Respiratory system: bilateral scattered wheezing heard.  Cardiovascular system: S1 & S2 heard, RRR. No JVD,  Gastrointestinal system: Abdomen is nondistended, soft and nontender.  Central nervous system: Alert and oriented. No focal neurological deficits. Extremities: Symmetric 5 x 5 power. Skin: No rashes, lesions or ulcers Psychiatry: Mood & affect appropriate.      Data Reviewed:  I have personally reviewed following labs and imaging studies   CBC Lab Results  Component Value Date   WBC 5.4 01/09/2023   RBC 4.86 01/09/2023   HGB 13.3 01/09/2023   HCT 42.7 01/09/2023   MCV 87.9 01/09/2023   MCH  27.4 01/09/2023   PLT 238 01/09/2023   MCHC 31.1 01/09/2023   RDW 14.6 01/09/2023   LYMPHSABS 0.7 01/08/2023   MONOABS 0.7 01/08/2023   EOSABS 0.0 01/08/2023   BASOSABS 0.0 55/73/2202     Last metabolic panel Lab Results  Component Value Date   NA 137 01/10/2023   K 3.8 01/10/2023   CL 105 01/10/2023   CO2 23 01/10/2023   BUN 41 (H) 01/10/2023   CREATININE 1.03 (H) 01/10/2023   GLUCOSE 153 (H) 01/10/2023   GFRNONAA 50 (L) 01/10/2023   GFRAA 37 (L) 11/13/2016   CALCIUM 9.1 01/10/2023   PHOS 3.4 09/07/2014   PROT 7.8 01/08/2023   ALBUMIN 3.9 01/08/2023   LABGLOB 2.2 06/21/2014   AGRATIO 2.0 06/21/2014   BILITOT 1.0 01/08/2023   ALKPHOS 53 01/08/2023   AST 26 01/08/2023   ALT 14 01/08/2023   ANIONGAP 9 01/10/2023    CBG (last 3)  Recent Labs    01/09/23 2139 01/10/23 0736 01/10/23 1137  GLUCAP 122* 106* 118*       Coagulation Profile: No results for input(s): "INR", "PROTIME" in the last 168 hours.   Radiology Studies: CT CHEST WO CONTRAST  Result Date: 01/08/2023 CLINICAL DATA:  Cough weakness EXAM: CT CHEST WITHOUT CONTRAST TECHNIQUE: Multidetector CT imaging of the chest was performed following the standard protocol without IV contrast. RADIATION DOSE REDUCTION: This exam was performed according to the departmental dose-optimization program which includes automated exposure control, adjustment of the mA and/or kV according to patient size and/or use of iterative reconstruction technique. COMPARISON:  Chest x-ray in 01/08/2023 FINDINGS: Cardiovascular: Limited assessment without intravenous contrast. Moderate aortic atherosclerosis. No aneurysm. Coronary vascular calcification. Upper normal cardiac size. No pericardial effusion. Mediastinum/Nodes: Midline trachea. No thyroid mass. Subcentimeter mediastinal lymph nodes. Moderate hiatal hernia. Lungs/Pleura: Mild apical scarring. Scattered areas of mild bronchiectasis within the upper lobes and right middle lobe. 4  mm nodule left lung base, series 5, image 107. Other punctate nodules left lung base. Suspicion of minimal tree-in-bud density at the left base and lingula but limited by motion degradation. Upper Abdomen: No acute abnormality. Musculoskeletal: No acute osseous abnormality. IMPRESSION: 1. Scattered areas of mild bronchiectasis within the upper lobes and right middle lobe. Suspicion of minimal tree-in-bud density at the left base and lingula but limited by motion degradation, suspect for mild  respiratory infection, potentially due to atypical organism. 2. Moderate hiatal hernia. 3. Small left lower lobe pulmonary nodules measuring up to 4 mm. No follow-up needed if patient is low-risk (and has no known or suspected primary neoplasm). Non-contrast chest CT can be considered in 12 months if patient is high-risk. This recommendation follows the consensus statement: Guidelines for Management of Incidental Pulmonary Nodules Detected on CT Images: From the Fleischner Society 2017; Radiology 2017; 284:228-243. 4. Aortic atherosclerosis. Aortic Atherosclerosis (ICD10-I70.0). Electronically Signed   By: Donavan Foil M.D.   On: 01/08/2023 22:49   DG Chest Port 1 View  Result Date: 01/08/2023 CLINICAL DATA:  Cough EXAM: PORTABLE CHEST 1 VIEW COMPARISON:  10/14/2018 FINDINGS: Cardiac silhouette is prominent. Bibasilar subsegmental atelectasis. Lungs are otherwise clear. Normal pulmonary vasculature. Calcified aorta. IMPRESSION: Basilar subsegmental atelectasis or scarring. Prominent cardiac silhouette. Electronically Signed   By: Sammie Bench M.D.   On: 01/08/2023 17:04       Hosie Poisson M.D. [facility] 01/10/2023, 1:54 PM  Available via Epic secure chat 7am-7pm After 7 pm, please refer to night coverage provider listed on amion.

## 2023-01-10 NOTE — Evaluation (Signed)
Occupational Therapy Evaluation Patient Details Name: Dana Burns MRN: [id number] DOB: 02-08-1926 Today's Date: 01/10/2023   History of Present Illness 89 y.o. female with medical history significant of DM2, HTN, colon cancer, fibromyalgia, chronic pain.  Pt presents to ED with 7 day h/o nonproductive cough, fever, chills, anorexia, worsening generalized weakness. She reported a few episodes of vomiting on Thurs, occasional diarrhea. Dx: RSV bronchitis   Clinical Impression   Pt admitted with the above. Pt currently with functional limitations due to the deficits listed below (see OT Problem List).  Pt will benefit from skilled OT to increase their safety and independence with ADL and functional mobility for ADL to facilitate discharge to venue listed below.        Recommendations for follow up therapy are one component of a multi-disciplinary discharge planning process, led by the attending physician.  Recommendations may be updated based on patient status, additional functional criteria and insurance authorization.   Follow Up Recommendations  Home health OT ( daugther will provide 24/7 A)    Assistance Recommended at Discharge Frequent or constant Supervision/Assistance  Patient can return home with the following A little help with walking and/or transfers;Assistance with cooking/housework;A little help with bathing/dressing/bathroom;A lot of help with bathing/dressing/bathroom;A lot of help with walking and/or transfers    Functional Status Assessment  Patient has had a recent decline in their functional status and demonstrates the ability to make significant improvements in function in a reasonable and predictable amount of time.  Equipment Recommendations  None recommended by OT    Recommendations for Other Services       Precautions / Restrictions Precautions Precautions: Fall Precaution Comments: pt denies falls in past 6 months Restrictions Weight Bearing Restrictions:  No      Mobility Bed Mobility               General bed mobility comments: on BSC    Transfers Overall transfer level: Needs assistance Equipment used: Rolling walker (2 wheels) Transfers: Sit to/from Stand   Stand pivot transfers: Mod assist   Step pivot transfers: Mod assist     General transfer comment: pt mod A with transfer this day      Balance Overall balance assessment: Needs assistance Sitting-balance support: Feet supported, No upper extremity supported Sitting balance-Leahy Scale: Good     Standing balance support: Bilateral upper extremity supported, During functional activity, Reliant on assistive device for balance Standing balance-Leahy Scale: Fair                             ADL either performed or assessed with clinical judgement   ADL Overall ADL's : Needs assistance/impaired Eating/Feeding: Set up;Sitting   Grooming: Minimal assistance;Sitting   Upper Body Bathing: Minimal assistance;Sitting   Lower Body Bathing: Moderate assistance;Sit to/from stand;Cueing for safety;Cueing for sequencing   Upper Body Dressing : Minimal assistance;Sitting   Lower Body Dressing: Moderate assistance;Sit to/from stand;Cueing for sequencing;Cueing for safety   Toilet Transfer: Moderate assistance;Stand-pivot   Toileting- Clothing Manipulation and Hygiene: Sit to/from stand;Moderate assistance       Functional mobility during ADLs: Cueing for safety;Cueing for sequencing;Moderate assistance General ADL Comments: pts daugther present and will provide A as home as needed     Vision   Vision Assessment?: No apparent visual deficits     Perception     Praxis      Pertinent Vitals/Pain Pain Assessment Pain Assessment: No/denies pain  Hand Dominance     Extremity/Trunk Assessment Upper Extremity Assessment Upper Extremity Assessment: Generalized weakness           Communication Communication Communication: HOH   Cognition  Arousal/Alertness: Awake/alert Behavior During Therapy: WFL for tasks assessed/performed Overall Cognitive Status: Within Functional Limits for tasks assessed                                                  Home Living Family/patient expects to be discharged to:: Private residence Living Arrangements: Children Available Help at Discharge: Family;Available 24 hours/day Type of Home: House Home Access: Stairs to enter [employer] of Steps: 3   Home Layout: One level     Bathroom Shower/Tub: Tub/shower unit         Home Equipment: Conservation officer, nature (2 wheels);Cane - single point          Prior Functioning/Environment Prior Level of Function : Independent/Modified Independent             Mobility Comments: walks with a cane, denies h/o falls in past 6 months          OT Problem List: Decreased strength;Decreased activity tolerance;Impaired balance (sitting and/or standing)      OT Treatment/Interventions: Self-care/ADL training;Patient/family education    OT Goals(Current goals can be found in the care plan section) Acute Rehab OT Goals Patient Stated Goal: did not state OT Goal Formulation: With patient Time For Goal Achievement: 01/10/23 Potential to Achieve Goals: Good ADL Goals Pt Will Perform Grooming: with supervision;standing Pt Will Transfer to Toilet: with supervision;ambulating Pt Will Perform Toileting - Clothing Manipulation and hygiene: with supervision;sit to/from stand  OT Frequency: Min 2X/week       AM-PAC OT "6 Clicks" Daily Activity     Outcome Measure Help from another person eating meals?: A Little Help from another person taking care of personal grooming?: A Little Help from another person toileting, which includes using toliet, bedpan, or urinal?: A Lot Help from another person bathing (including washing, rinsing, drying)?: A Lot Help from another person to put on and taking off regular upper body  clothing?: A Little Help from another person to put on and taking off regular lower body clothing?: A Lot 6 Click Score: 15   End of Session Equipment Utilized During Treatment: Rolling walker (2 wheels);Gait belt Nurse Communication: Mobility status  Activity Tolerance: Patient tolerated treatment well Patient left: in chair;with call bell/phone within reach  OT Visit Diagnosis: Unsteadiness on feet (R26.81);Muscle weakness (generalized) (M62.81);History of falling (Z91.81)                Time: 5361-4431 OT Time Calculation (min): 14 min Charges:  OT General Charges $OT Visit: 1 Visit OT Evaluation $OT Eval Low Complexity: 1 Low  Kari Baars, OT Acute Rehabilitation Services [phone] Office- [phone], Edwena Felty D 01/10/2023, 1:33 PM

## 2023-01-10 NOTE — Progress Notes (Signed)
Initial Nutrition Assessment  DOCUMENTATION CODES:   Severe malnutrition in context of chronic illness  INTERVENTION:   -Needs weight for admission (last recorded 11/20/22)  -Ensure Plus High Protein po BID, each supplement provides 350 kcal and 20 grams of protein.   -Multivitamin with minerals daily  NUTRITION DIAGNOSIS:   Severe Malnutrition related to chronic illness (chronic pain) as evidenced by severe fat depletion, severe muscle depletion, energy intake < or equal to 75% for > or equal to 1 month.  GOAL:   Patient will meet greater than or equal to 90% of their needs  MONITOR:   PO intake, Supplement acceptance, Weight trends, Labs, I & O's  REASON FOR ASSESSMENT:   Consult Assessment of nutrition requirement/status  ASSESSMENT:   89 y.o. female with medical history significant of DM2, HTN.     Pt presents to ED with 7 day h/o nonproductive cough, fever, chills, anorexia, worsening generalized weakness.  She was admitted for RSV bronchiolitis.  Patient in room, daughter at bedside. Pt continues to cough, seems very uncomfortable. Sitting in chair, states her room is very cold, RD adjusted thermostat. Pt states she has had leg pain for years and this has impacted her appetite. She began to feel sick a week ago which decreased her appetite even more. She was having diarrhea and some  vomiting from coughing. Pt doesn't tend to drink protein supplements at home but she is willing to drink them here. Pt takes Vitamin D and gets monthly Vitamin B-12 shots.   Lunch sitting in room, ate a few bites of mashed potatoes and peaches. Drank a few sips of Ensure so far.   Medications: Multivitamin with minerals daily, Lactated ringers  Labs reviewed: CBGs: 106-202   NUTRITION - FOCUSED PHYSICAL EXAM:  Flowsheet Row Most Recent Value  Orbital Region Moderate depletion  Upper Arm Region Severe depletion  Thoracic and Lumbar Region Unable to assess  Buccal Region Severe  depletion  Temple Region Severe depletion  Clavicle Bone Region Severe depletion  Clavicle and Acromion Bone Region Severe depletion  Scapular Bone Region Unable to assess  Dorsal Hand Severe depletion  Patellar Region Moderate depletion  Anterior Thigh Region Moderate depletion  Posterior Calf Region Moderate depletion  Edema (RD Assessment) None  Hair Reviewed  Eyes Reviewed  Mouth Reviewed  [missing teeth, doesn't have her partials here]  Skin Reviewed       Diet Order:   Diet Order             Diet Carb Modified Fluid consistency: Thin; Room service appropriate? Yes  Diet effective now                   EDUCATION NEEDS:   No education needs have been identified at this time  Skin:  Skin Assessment: Reviewed RN Assessment  Last BM:  PTA  Height:   Ht Readings from Last 1 Encounters:  11/20/22 '5\' 6"'$  (1.676 m)    Weight:   Wt Readings from Last 1 Encounters:  11/20/22 63.5 kg    BMI:  There is no height or weight on file to calculate BMI.  Estimated Nutritional Needs:   Kcal:  1400-1600  Protein:  70-80g  Fluid:  1.6L/day  Clayton Bibles, MS, RD, LDN Inpatient Clinical Dietitian Contact information available via Amion

## 2023-01-10 NOTE — Progress Notes (Signed)
Sent Dana Rover, NP, the following message: Hi Abigail.. I have the grandson here in the pt's room. He wants to speak with someone face to face. He was under the impression that Dr. Karleen Hampshire was coming to see him and that hasn't happened. He wants to speak with someone face to face about what is going on with his grandmother.   She responded back with the following message: please let him know that the rounding physician will be available tomorrow morning if he wishes to speak with someone, my role as "cross coverage NP" is to assist with acute situations and emergencies. I have little say in the pts care plan and would not be able to answer his questions fully.   Nurse informed him of the above, as she requested.   He then asked for the person who is over the department's information. Shared with him Nanine Means RN- Director's card with her contact information on it.  Nurse also shared with him the phone number that she is assigned to, tonight, for him to call, if he would like to check on his grandmother, during the night.

## 2023-01-11 DIAGNOSIS — J210 Acute bronchiolitis due to respiratory syncytial virus: Secondary | ICD-10-CM | POA: Diagnosis not present

## 2023-01-11 DIAGNOSIS — I10 Essential (primary) hypertension: Secondary | ICD-10-CM | POA: Diagnosis not present

## 2023-01-11 DIAGNOSIS — E1142 Type 2 diabetes mellitus with diabetic polyneuropathy: Secondary | ICD-10-CM | POA: Diagnosis not present

## 2023-01-11 LAB — CBC WITH DIFFERENTIAL/PLATELET
Abs Immature Granulocytes: 0.01 10*3/uL (ref 0.00–0.07)
Basophils Absolute: 0 10*3/uL (ref 0.0–0.1)
Basophils Relative: 0 %
Eosinophils Absolute: 0 10*3/uL (ref 0.0–0.5)
Eosinophils Relative: 0 %
HCT: 39.4 % (ref 36.0–46.0)
Hemoglobin: 12.4 g/dL (ref 12.0–15.0)
Immature Granulocytes: 0 %
Lymphocytes Relative: 17 %
Lymphs Abs: 1 10*3/uL (ref 0.7–4.0)
MCH: 27.4 pg (ref 26.0–34.0)
MCHC: 31.5 g/dL (ref 30.0–36.0)
MCV: 87 fL (ref 80.0–100.0)
Monocytes Absolute: 0.6 10*3/uL (ref 0.1–1.0)
Monocytes Relative: 10 %
Neutro Abs: 4.4 10*3/uL (ref 1.7–7.7)
Neutrophils Relative %: 73 %
Platelets: 240 10*3/uL (ref 150–400)
RBC: 4.53 MIL/uL (ref 3.87–5.11)
RDW: 14.4 % (ref 11.5–15.5)
WBC: 6 10*3/uL (ref 4.0–10.5)
nRBC: 0 % (ref 0.0–0.2)

## 2023-01-11 LAB — BASIC METABOLIC PANEL
Anion gap: 7 (ref 5–15)
BUN: 35 mg/dL — ABNORMAL HIGH (ref 8–23)
CO2: 25 mmol/L (ref 22–32)
Calcium: 9 mg/dL (ref 8.9–10.3)
Chloride: 106 mmol/L (ref 98–111)
Creatinine, Ser: 0.96 mg/dL (ref 0.44–1.00)
GFR, Estimated: 55 mL/min — ABNORMAL LOW (ref >60)
Glucose, Bld: 92 mg/dL (ref 70–99)
Potassium: 3.9 mmol/L (ref 3.5–5.1)
Sodium: 138 mmol/L (ref 135–145)

## 2023-01-11 LAB — GLUCOSE, CAPILLARY
Glucose-Capillary: 101 mg/dL — ABNORMAL HIGH (ref 70–99)
Glucose-Capillary: 184 mg/dL — ABNORMAL HIGH (ref 70–99)
Glucose-Capillary: 147 mg/dL — ABNORMAL HIGH (ref 70–99)
Glucose-Capillary: 97 mg/dL (ref 70–99)

## 2023-01-11 MED ORDER — CITALOPRAM HYDROBROMIDE 20 MG PO TABS
20.0000 mg | ORAL_TABLET | Freq: Every day | ORAL | Status: DC
  Administered 2023-01-11 – 2023-01-17 (×7): 20 mg via ORAL
  Filled 2023-01-11 (×7): qty 1

## 2023-01-11 MED ORDER — FAMOTIDINE 20 MG PO TABS
40.0000 mg | ORAL_TABLET | Freq: Every day | ORAL | Status: DC
  Administered 2023-01-12 – 2023-01-16 (×5): 40 mg via ORAL
  Filled 2023-01-11 (×7): qty 2

## 2023-01-11 MED ORDER — VENLAFAXINE HCL ER 37.5 MG PO CP24
37.5000 mg | ORAL_CAPSULE | Freq: Every day | ORAL | Status: DC
  Administered 2023-01-12 – 2023-01-17 (×6): 37.5 mg via ORAL
  Filled 2023-01-11 (×6): qty 1

## 2023-01-11 MED ORDER — ROPINIROLE HCL 1 MG PO TABS
1.0000 mg | ORAL_TABLET | Freq: Every day | ORAL | Status: DC
  Administered 2023-01-12 – 2023-01-16 (×5): 1 mg via ORAL
  Filled 2023-01-11 (×6): qty 1

## 2023-01-11 MED ORDER — TIZANIDINE HCL 4 MG PO TABS: 4.0000 mg | ORAL_TABLET | Freq: Two times a day (BID) | ORAL | Status: DC | PRN

## 2023-01-11 MED ORDER — DIAZEPAM 5 MG PO TABS
2.5000 mg | ORAL_TABLET | Freq: Every day | ORAL | Status: DC
  Administered 2023-01-11 – 2023-01-17 (×7): 2.5 mg via ORAL
  Filled 2023-01-11 (×7): qty 1

## 2023-01-11 MED ORDER — GABAPENTIN 400 MG PO CAPS
800.0000 mg | ORAL_CAPSULE | Freq: Three times a day (TID) | ORAL | Status: DC
  Administered 2023-01-11 – 2023-01-17 (×18): 800 mg via ORAL
  Filled 2023-01-11 (×19): qty 2

## 2023-01-11 NOTE — Progress Notes (Signed)
Pt walked in hallway with pulse ox and o2 saturation remained above 94 throughout the walk.

## 2023-01-11 NOTE — Progress Notes (Signed)
[facility]                                                                               [name], is a 89 y.o. female, DOB - 1926-10-11, VQQ:[id number] Admit date - 01/08/2023    Outpatient Primary MD for the patient is Mosie Lukes, MD  LOS - 1  days    Brief summary   Dana Burns is a 89 y.o. female with medical history significant of DM2, HTN.   Pt presents to ED with 7 day h/o nonproductive cough, fever, chills, anorexia, worsening generalized weakness.  She was admitted for RSV bronchiolitis.     Assessment & Plan    Assessment and Plan: * RSV bronchiolitis/ acute respiratory failure with hypoxia requiring about 2 to 3 lit of [place] oxygen.  On exam today,  wheezing has improved. Cough has improved. She was off oxygen and on RA sat 95%.  Recommend checking ambulating oxygen levels today.  Continue with IV solumedrol 40 mg daily. Possible transition to oral steroids in am and discharge. Discussed the plan with the patient , her daughter and her grand son over the phone.  CT chest showing atypical infection. Started her on azithromycin. Sputum cultures have been ordered.  Continue with duonebs and cough meds as needed.  Recommend weaning her oxygen off slowly in the next 24 hours.  Therapy evaluations have been ordered.   Type 2 diabetes mellitus with peripheral neuropathy (HCC)  Started her on SSI.  CBG (last 3)  Recent Labs    01/10/23 2119 01/11/23 0732 01/11/23 1136  GLUCAP 122* 101* 184*   Continue with SSI.  A1c is 6.8%   Essential hypertension, benign BP parameters are optimal today.      Interventions: Ensure Enlive (each supplement provides 350kcal and 20 grams of protein), MVI  Estimated body mass index is 21.31 kg/m as calculated from the following:   Height as of this encounter: '5\' 6"'$  (1.676 m).   Weight as of this encounter: 59.9 kg.  Code Status: DNR  DVT Prophylaxis:  enoxaparin (LOVENOX) injection 40 mg Start: 01/09/23  1000   Level of Care: Level of care: Med-Surg Family Communication: none at bedside.   Disposition Plan:     Remains inpatient appropriate:  pending improvement / possibly tomorrow.   Procedures:  \none.   Consultants:   None.   Antimicrobials:   Anti-infectives (From admission, onward)    Start     Dose/Rate Route Frequency Ordered Stop   01/09/23 1045  azithromycin (ZITHROMAX) tablet 500 mg        500 mg Oral Daily 01/09/23 0958 01/14/23 0959        Medications  Scheduled Meds:  azithromycin  500 mg Oral Daily   citalopram  20 mg Oral Daily   diazepam  2.5 mg Oral Daily   enoxaparin (LOVENOX) injection  40 mg Subcutaneous Q24H   famotidine  40 mg Oral QHS   feeding supplement  237 mL Oral BID BM   gabapentin  800 mg Oral TID   insulin aspart  0-15 Units Subcutaneous TID WC   methylPREDNISolone (SOLU-MEDROL) injection  40 mg Intravenous Q24H  mometasone-formoterol  2 puff Inhalation BID   multivitamin with minerals  1 tablet Oral Daily   rOPINIRole  1 mg Oral QHS   umeclidinium bromide  1 puff Inhalation Daily   [START ON 01/12/2023] venlafaxine XR  37.5 mg Oral Q breakfast   Continuous Infusions:  lactated ringers 75 mL/hr at 01/10/23 1901   PRN Meds:.acetaminophen, benzonatate, guaiFENesin-dextromethorphan, hydrALAZINE, levalbuterol, tiZANidine    Subjective:   Dana Burns was seen and examined today.  Wheezing and cough has improved.    Objective:   Vitals:   01/10/23 1937 01/10/23 2122 01/11/23 0516 01/11/23 1138  BP:  (!) 168/72 (!) 167/82 (!) 142/70  Pulse:  87 84 87  Resp:  '18 18 19  '$ Temp:   97.8 F (36.6 C) 98 F (36.7 C)  TempSrc:   Oral Oral  SpO2: 95% 97% 98% 97%  Weight:    59.9 kg  Height:    '5\' 6"'$  (1.676 m)    Intake/Output Summary (Last 24 hours) at 01/11/2023 1323 Last data filed at 01/11/2023 1139 Gross per 24 hour  Intake 1781.81 ml  Output 1400 ml  Net 381.81 ml    Filed Weights   01/11/23 1138  Weight: 59.9 kg      Exam General exam: Appears calm and comfortable  Respiratory system: Clear to auscultation. Respiratory effort normal. Cardiovascular system: S1 & S2 heard, RRR. No JVD,  No pedal edema. Gastrointestinal system: Abdomen is nondistended, soft and nontender.  Central nervous system: Alert and oriented. No focal neurological deficits. Extremities: Symmetric 5 x 5 power. Skin: No rashes, lesions or ulcers Psychiatry:  Mood & affect appropriate.       Data Reviewed:  I have personally reviewed following labs and imaging studies   CBC Lab Results  Component Value Date   WBC 6.0 01/11/2023   RBC 4.53 01/11/2023   HGB 12.4 01/11/2023   HCT 39.4 01/11/2023   MCV 87.0 01/11/2023   MCH 27.4 01/11/2023   PLT 240 01/11/2023   MCHC 31.5 01/11/2023   RDW 14.4 01/11/2023   LYMPHSABS 1.0 01/11/2023   MONOABS 0.6 01/11/2023   EOSABS 0.0 01/11/2023   BASOSABS 0.0 37/09/6268     Last metabolic panel Lab Results  Component Value Date   NA 138 01/11/2023   K 3.9 01/11/2023   CL 106 01/11/2023   CO2 25 01/11/2023   BUN 35 (H) 01/11/2023   CREATININE 0.96 01/11/2023   GLUCOSE 92 01/11/2023   GFRNONAA 55 (L) 01/11/2023   GFRAA 37 (L) 11/13/2016   CALCIUM 9.0 01/11/2023   PHOS 3.4 09/07/2014   PROT 7.8 01/08/2023   ALBUMIN 3.9 01/08/2023   LABGLOB 2.2 06/21/2014   AGRATIO 2.0 06/21/2014   BILITOT 1.0 01/08/2023   ALKPHOS 53 01/08/2023   AST 26 01/08/2023   ALT 14 01/08/2023   ANIONGAP 7 01/11/2023    CBG (last 3)  Recent Labs    01/10/23 2119 01/11/23 0732 01/11/23 1136  GLUCAP 122* 101* 184*       Coagulation Profile: No results for input(s): "INR", "PROTIME" in the last 168 hours.   Radiology Studies: No results found.     Hosie Poisson M.D. [facility] 01/11/2023, 1:23 PM  Available via Epic secure chat 7am-7pm After 7 pm, please refer to night coverage provider listed on amion.

## 2023-01-11 NOTE — TOC Progression Note (Signed)
Transition of Care [facility]) - Progression Note   Patient Details  Name: Dana Burns MRN: [id number] Date of Birth: 1926/11/19  [facility]) CM/SW [place], LCSW Phone Number: 01/11/2023, 10:26 AM  Clinical Narrative: OT evaluation recommended HHOT. HH orders have been placed for PT and OT. CSW notified Caryl Pina with [facility] that orders are in. CSW updated daughter regarding HH being recommended by OT and that it has been set up. No additional TOC needs identified at this time.  Expected Discharge Plan: [place] Barriers to Discharge: Continued Medical Work up  Expected Discharge Plan and Services In-house Referral: Clinical Social Work Post Acute Care Choice: [place] arrangements for the past 2 months: Single Family Home             DME Arranged: N/A DME Agency: NA HH Arranged: PT, OT [place] Agency: [place] ([facility]) Date HH Agency Contacted: 01/09/23 Time [place]: [place] Representative spoke with at [place]: [place] ([place]) Interventions SDOH Screenings   Food Insecurity: No Food Insecurity (01/09/2023)  Housing: Low Risk  (01/09/2023)  Transportation Needs: No Transportation Needs (01/09/2023)  Utilities: Not At Risk (01/09/2023)  Alcohol Screen: Low Risk  (03/01/2022)  Depression (PHQ2-9): Low Risk  (01/07/2023)  Financial Resource Strain: Low Risk  (03/01/2022)  Physical Activity: Inactive (03/01/2022)  Stress: No Stress Concern Present (03/01/2022)  Tobacco Use: Low Risk  (01/09/2023)   Readmission Risk Interventions     No data to display

## 2023-01-11 NOTE — Progress Notes (Addendum)
Physical Therapy Treatment Patient Details Name: Dana Burns MRN: [id number] DOB: 1926/04/08 Today's Date: 01/11/2023   History of Present Illness 89 y.o. female with medical history significant of DM2, HTN, colon cancer, fibromyalgia, chronic pain.  Pt presents to ED with 7 day h/o nonproductive cough, fever, chills, anorexia, worsening generalized weakness. She reported a few episodes of vomiting on Thurs, occasional diarrhea. Dx: RSV bronchitis    PT Comments    Noted pt ambulated in the hallway with nursing this morning. Pt requested assistance to the bathroom upon my arrival. Pt ambulated 15' with RW to the toilet, min A for balance.  She required a prolonged period of time on the commode, so she was instructed to use call bell when she finishes, daughter in room. Pt's daughter stated she is available to assist pt 24/7 at home. Daughter was encouraged to provide hands on assist when pt ambulates as she is unsteady. Daughter verbalized understanding.     Recommendations for follow up therapy are one component of a multi-disciplinary discharge planning process, led by the attending physician.  Recommendations may be updated based on patient status, additional functional criteria and insurance authorization.  Follow Up Recommendations  Home health PT     Assistance Recommended at Discharge Intermittent Supervision/Assistance  Patient can return home with the following A little help with walking and/or transfers;A little help with bathing/dressing/bathroom;Assistance with cooking/housework;Assist for transportation;Help with stairs or ramp for entrance   Equipment Recommendations  None recommended by PT    Recommendations for Other Services       Precautions / Restrictions Precautions Precautions: Fall Precaution Comments: pt denies falls in past 6 months but unsteady with walking Restrictions Weight Bearing Restrictions: No     Mobility  Bed Mobility   Bed Mobility: Supine  to Sit     Supine to sit: Min assist          Transfers Overall transfer level: Needs assistance Equipment used: Rolling walker (2 wheels) Transfers: Sit to/from Stand Sit to Stand: Min assist           General transfer comment: min A to power up    Ambulation/Gait Ambulation/Gait assistance: Min assist Gait Distance (Feet): 15 Feet Assistive device: Rolling walker (2 wheels) Gait Pattern/deviations: Step-through pattern, Decreased stride length, Trunk flexed Gait velocity: WFL     General Gait Details: min A for balance, assisted pt with walking from bed to bathroom   Stairs             Wheelchair Mobility    Modified Rankin (Stroke Patients Only)       Balance Overall balance assessment: Needs assistance Sitting-balance support: Feet supported, No upper extremity supported Sitting balance-Leahy Scale: Good     Standing balance support: Bilateral upper extremity supported, During functional activity, Reliant on assistive device for balance Standing balance-Leahy Scale: Fair                              Cognition Arousal/Alertness: Awake/alert Behavior During Therapy: WFL for tasks assessed/performed Overall Cognitive Status: Within Functional Limits for tasks assessed                                          Exercises      General Comments        Pertinent Vitals/Pain Pain Assessment Pain Assessment: No/denies  pain Pain Score: 0-No pain    Home Living                          Prior Function            PT Goals (current goals can now be found in the care plan section) Acute Rehab PT Goals Patient Stated Goal: to get strong enough to go home PT Goal Formulation: With patient Time For Goal Achievement: 01/23/23 Potential to Achieve Goals: Good Progress towards PT goals: Progressing toward goals    Frequency    Min 3X/week      PT Plan Current plan remains appropriate     Co-evaluation              AM-PAC PT "6 Clicks" Mobility   Outcome Measure  Help needed turning from your back to your side while in a flat bed without using bedrails?: None Help needed moving from lying on your back to sitting on the side of a flat bed without using bedrails?: A Little Help needed moving to and from a bed to a chair (including a wheelchair)?: A Little Help needed standing up from a chair using your arms (e.g., wheelchair or bedside chair)?: A Little Help needed to walk in hospital room?: A Little Help needed climbing 3-5 steps with a railing? : A Little 6 Click Score: 19    End of Session Equipment Utilized During Treatment: Gait belt Activity Tolerance: Patient limited by fatigue Patient left: with call bell/phone within reach;with family/visitor present;Other (comment) (left on toilet) Nurse Communication: Mobility status PT Visit Diagnosis: Difficulty in walking, not elsewhere classified (R26.2);Other abnormalities of gait and mobility (R26.89);Pain     Time: 1610-9604 PT Time Calculation (min) (ACUTE ONLY): 15 min  Charges:  $Gait Training: 8-22 mins          Blondell Reveal Kistler PT 01/11/2023  Acute Rehabilitation Services  Office [phone]

## 2023-01-12 DIAGNOSIS — I10 Essential (primary) hypertension: Secondary | ICD-10-CM | POA: Diagnosis not present

## 2023-01-12 DIAGNOSIS — E1142 Type 2 diabetes mellitus with diabetic polyneuropathy: Secondary | ICD-10-CM | POA: Diagnosis not present

## 2023-01-12 DIAGNOSIS — J210 Acute bronchiolitis due to respiratory syncytial virus: Secondary | ICD-10-CM | POA: Diagnosis not present

## 2023-01-12 LAB — GLUCOSE, CAPILLARY
Glucose-Capillary: 121 mg/dL — ABNORMAL HIGH (ref 70–99)
Glucose-Capillary: 163 mg/dL — ABNORMAL HIGH (ref 70–99)
Glucose-Capillary: 306 mg/dL — ABNORMAL HIGH (ref 70–99)
Glucose-Capillary: 69 mg/dL — ABNORMAL LOW (ref 70–99)
Glucose-Capillary: 102 mg/dL — ABNORMAL HIGH (ref 70–99)

## 2023-01-12 MED ORDER — PREDNISONE 20 MG PO TABS
40.0000 mg | ORAL_TABLET | Freq: Every day | ORAL | Status: AC
  Administered 2023-01-13 – 2023-01-15 (×3): 40 mg via ORAL
  Filled 2023-01-12 (×3): qty 2

## 2023-01-12 MED ORDER — AMLODIPINE BESYLATE 5 MG PO TABS
5.0000 mg | ORAL_TABLET | Freq: Every day | ORAL | Status: DC
  Administered 2023-01-12 – 2023-01-13 (×2): 5 mg via ORAL
  Filled 2023-01-12 (×2): qty 1

## 2023-01-12 NOTE — Progress Notes (Signed)
Pt has poor effort at doing inhalers.

## 2023-01-12 NOTE — Progress Notes (Signed)
[facility]                                                                               Dana Burns, is a 89 y.o. female, DOB - 1926-07-09, KJZ:[id number] Admit date - 01/08/2023    Outpatient Primary MD for the patient is Mosie Lukes, MD  LOS - 2  days    Brief summary   Dana Burns is a 89 y.o. female with medical history significant of DM2, HTN.   Pt presents to ED with 7 day h/o nonproductive cough, fever, chills, anorexia, worsening generalized weakness.  She was admitted for RSV bronchiolitis.     Assessment & Plan    Assessment and Plan: * RSV bronchiolitis/ acute respiratory failure with hypoxia requiring about 2 to 3 lit of [place] oxygen.  On exam today,  wheezing has improved. Cough has improved. She was off oxygen and on RA sat 95%.  Recommend checking ambulating oxygen levels today.  Continue with IV solumedrol 40 mg daily. Transitioned to oral steroids today. She reports that she does not feel great and does nto want to go home today. She remains on 2 lit of [place] oxygen and will need ambulating oxygen levels prior to discharge.  Discussed the plan with the patient , her daughter and her grand son over the phone.  CT chest showing atypical infection. Started her on azithromycin. Sputum cultures have been ordered.  Continue with duonebs and cough meds as needed.  Recommend weaning her oxygen off slowly in the next 24 hours.  Therapy evaluations have been ordered.    Type 2 diabetes mellitus with peripheral neuropathy (HCC)  Started her on SSI.  CBG (last 3)  Recent Labs    01/12/23 0723 01/12/23 1142 01/12/23 1641  GLUCAP 121* 163* 306*   Continue with SSI.  A1c is 6.8%   Essential hypertension, benign BP parameters are much better today.      Interventions: Ensure Enlive (each supplement provides 350kcal and 20 grams of protein), MVI  Estimated body mass index is 21.31 kg/m as calculated from the following:   Height as of this  encounter: '5\' 6"'$  (1.676 m).   Weight as of this encounter: 59.9 kg.  Code Status: DNR  DVT Prophylaxis:  enoxaparin (LOVENOX) injection 40 mg Start: 01/09/23 1000   Level of Care: Level of care: Med-Surg Family Communication: none at bedside.   Disposition Plan:     Remains inpatient appropriate:  pending improvement / possibly tomorrow.   Procedures:  \none.   Consultants:   None.   Antimicrobials:   Anti-infectives (From admission, onward)    Start     Dose/Rate Route Frequency Ordered Stop   01/09/23 1045  azithromycin (ZITHROMAX) tablet 500 mg        500 mg Oral Daily 01/09/23 0958 01/14/23 0959        Medications  Scheduled Meds:  amLODipine  5 mg Oral Daily   azithromycin  500 mg Oral Daily   citalopram  20 mg Oral Daily   diazepam  2.5 mg Oral Daily   enoxaparin (LOVENOX) injection  40 mg Subcutaneous Q24H   famotidine  40 mg Oral  QHS   feeding supplement  237 mL Oral BID BM   gabapentin  800 mg Oral TID   insulin aspart  0-15 Units Subcutaneous TID WC   methylPREDNISolone (SOLU-MEDROL) injection  40 mg Intravenous Q24H   mometasone-formoterol  2 puff Inhalation BID   multivitamin with minerals  1 tablet Oral Daily   rOPINIRole  1 mg Oral QHS   umeclidinium bromide  1 puff Inhalation Daily   venlafaxine XR  37.5 mg Oral Q breakfast   Continuous Infusions:  PRN Meds:.acetaminophen, benzonatate, guaiFENesin-dextromethorphan, hydrALAZINE, levalbuterol, tiZANidine    Subjective:   Dana Burns was seen and examined today.  Cough has improved.     Objective:   Vitals:   01/12/23 0500 01/12/23 0752 01/12/23 0847 01/12/23 1141  BP: (!) 183/74  (!) 180/93 130/71  Pulse: 85  73 76  Resp:   20 19  Temp:    97.8 F (36.6 C)  TempSrc:    Oral  SpO2: 96% 93%  95%  Weight:      Height:        Intake/Output Summary (Last 24 hours) at 01/12/2023 1647 Last data filed at 01/12/2023 1642 Gross per 24 hour  Intake 380 ml  Output 1000 ml  Net -620 ml     Filed Weights   01/11/23 1138  Weight: 59.9 kg     Exam General exam: Appears calm and comfortable  Respiratory system: Clear to auscultation. Respiratory effort normal. Cardiovascular system: S1 & S2 heard, RRR. No JVD,  Gastrointestinal system: Abdomen is nondistended, soft and nontender.  Central nervous system: Alert and oriented. No focal neurological deficits. Extremities: Symmetric 5 x 5 power. Skin: No rashes, lesions or ulcers Psychiatry: Mood & affect appropriate.      Data Reviewed:  I have personally reviewed following labs and imaging studies   CBC Lab Results  Component Value Date   WBC 6.0 01/11/2023   RBC 4.53 01/11/2023   HGB 12.4 01/11/2023   HCT 39.4 01/11/2023   MCV 87.0 01/11/2023   MCH 27.4 01/11/2023   PLT 240 01/11/2023   MCHC 31.5 01/11/2023   RDW 14.4 01/11/2023   LYMPHSABS 1.0 01/11/2023   MONOABS 0.6 01/11/2023   EOSABS 0.0 01/11/2023   BASOSABS 0.0 11/91/4782     Last metabolic panel Lab Results  Component Value Date   NA 138 01/11/2023   K 3.9 01/11/2023   CL 106 01/11/2023   CO2 25 01/11/2023   BUN 35 (H) 01/11/2023   CREATININE 0.96 01/11/2023   GLUCOSE 92 01/11/2023   GFRNONAA 55 (L) 01/11/2023   GFRAA 37 (L) 11/13/2016   CALCIUM 9.0 01/11/2023   PHOS 3.4 09/07/2014   PROT 7.8 01/08/2023   ALBUMIN 3.9 01/08/2023   LABGLOB 2.2 06/21/2014   AGRATIO 2.0 06/21/2014   BILITOT 1.0 01/08/2023   ALKPHOS 53 01/08/2023   AST 26 01/08/2023   ALT 14 01/08/2023   ANIONGAP 7 01/11/2023    CBG (last 3)  Recent Labs    01/12/23 0723 01/12/23 1142 01/12/23 1641  GLUCAP 121* 163* 306*       Coagulation Profile: No results for input(s): "INR", "PROTIME" in the last 168 hours.   Radiology Studies: No results found.     Hosie Poisson M.D. [facility] 01/12/2023, 4:47 PM  Available via Epic secure chat 7am-7pm After 7 pm, please refer to night coverage provider listed on amion.

## 2023-01-12 NOTE — Progress Notes (Signed)
Mobility Specialist - Progress Note   01/12/23 1609  Mobility  Activity Transferred from bed to chair  Level of Assistance Minimal assist, patient does 75% or more  Assistive Device Other (Comment) (HHA)  Distance Ambulated (ft) 2 ft  Range of Motion/Exercises Active  Activity Response Tolerated well  Mobility Referral Yes  $Mobility charge 1 Mobility   Pt was found in bed and agreeable to sit on recliner chair. Pt stated not needing the RW to go to chair but required HHA for transfer. At EOS was left with all necessities in reach and NT notified.  Ferd Hibbs Mobility Specialist

## 2023-01-13 DIAGNOSIS — J210 Acute bronchiolitis due to respiratory syncytial virus: Secondary | ICD-10-CM | POA: Diagnosis not present

## 2023-01-13 DIAGNOSIS — I10 Essential (primary) hypertension: Secondary | ICD-10-CM | POA: Diagnosis not present

## 2023-01-13 DIAGNOSIS — E1142 Type 2 diabetes mellitus with diabetic polyneuropathy: Secondary | ICD-10-CM | POA: Diagnosis not present

## 2023-01-13 LAB — GLUCOSE, CAPILLARY
Glucose-Capillary: 98 mg/dL (ref 70–99)
Glucose-Capillary: 180 mg/dL — ABNORMAL HIGH (ref 70–99)
Glucose-Capillary: 197 mg/dL — ABNORMAL HIGH (ref 70–99)
Glucose-Capillary: 155 mg/dL — ABNORMAL HIGH (ref 70–99)

## 2023-01-13 MED ORDER — METOPROLOL TARTRATE 25 MG PO TABS
12.5000 mg | ORAL_TABLET | Freq: Two times a day (BID) | ORAL | Status: DC
  Administered 2023-01-13 (×2): 12.5 mg via ORAL
  Filled 2023-01-13 (×3): qty 1

## 2023-01-13 NOTE — Progress Notes (Signed)
Mobility Specialist Cancellation/Refusal Note:  Pt declined mobility at this time due to high HR.  Will check back as schedule permits.     Pre-mobility: 127 bpm HR, 97% SpO2 (RA)   Set designer

## 2023-01-13 NOTE — Progress Notes (Signed)
[facility]                                                                               Dana Burns, is a 89 y.o. female, DOB - [birth year], [id number] Admit date - 01/08/2023    Outpatient Primary MD for the patient is Mosie Lukes, MD  LOS - 3  days    Brief summary   Dana Burns is a 89 y.o. female with medical history significant of DM2, HTN.   Pt presents to ED with 7 day h/o nonproductive cough, fever, chills, anorexia, worsening generalized weakness.  She was admitted for RSV bronchiolitis.     Assessment & Plan    Assessment and Plan: * RSV bronchiolitis/ acute respiratory failure with hypoxia requiring about 2 to 3 lit of [place] oxygen.  On exam today,  wheezing has improved. Cough has improved. She was off oxygen and on RA sat 95%.  Recommend checking ambulating oxygen levels today.  Continue with IV solumedrol 40 mg daily. Transitioned to oral steroids today. She reports that she does not feel great and does nto want to go home today. She remains on 2 lit of [place] oxygen and will need ambulating oxygen levels prior to discharge.  Discussed the plan with the patient , her daughter and her grand son over the phone.  CT chest showing atypical infection. Started her on azithromycin. Sputum cultures have been ordered.  Continue with duonebs and cough meds as needed.  Recommend weaning her oxygen off slowly in the next 24 hours.  Therapy evaluations have been ordered.    Type 2 diabetes mellitus with peripheral neuropathy (HCC)  Started her on SSI.  CBG (last 3)  Recent Labs    01/12/23 2303 01/13/23 0753 01/13/23 1155  GLUCAP 102* 98 180*   Continue with SSI.  A1c is 6.8%   Essential hypertension, benign BP parameters are much better today.   New onset of atrial fibrillation with RVR:  Rate better. Around 90's. Unclear etiology.  Will get TSH, echocardiogram and transfer her to telemetry.  Monitor overnight on telemetry.  Discussed with  the patient's family over the phone.  Family will decide about anticoagulation in the morning. Her CHAD2 VASC score is 3. She is at intermediate risk for stroke.       Interventions: Ensure Enlive (each supplement provides 350kcal and 20 grams of protein), MVI  Estimated body mass index is 21.31 kg/m as calculated from the following:   Height as of this encounter: '5\' 6"'$  (1.676 m).   Weight as of this encounter: 59.9 kg.  Code Status: DNR  DVT Prophylaxis:  enoxaparin (LOVENOX) injection 40 mg Start: 01/09/23 1000   Level of Care: Level of care: Telemetry Family Communication: none at bedside.   Disposition Plan:     Remains inpatient appropriate:  further work up for FIB.   Procedures:  ECHO.   Consultants:   None.   Antimicrobials:   Anti-infectives (From admission, onward)    Start     Dose/Rate Route Frequency Ordered Stop   01/09/23 1045  azithromycin (ZITHROMAX) tablet 500 mg        500 mg Oral Daily  01/09/23 0958 01/13/23 1000        Medications  Scheduled Meds:  citalopram  20 mg Oral Daily   diazepam  2.5 mg Oral Daily   enoxaparin (LOVENOX) injection  40 mg Subcutaneous Q24H   famotidine  40 mg Oral QHS   feeding supplement  237 mL Oral BID BM   gabapentin  800 mg Oral TID   insulin aspart  0-15 Units Subcutaneous TID WC   metoprolol tartrate  12.5 mg Oral BID   mometasone-formoterol  2 puff Inhalation BID   multivitamin with minerals  1 tablet Oral Daily   predniSONE  40 mg Oral QAC breakfast   rOPINIRole  1 mg Oral QHS   umeclidinium bromide  1 puff Inhalation Daily   venlafaxine XR  37.5 mg Oral Q breakfast   Continuous Infusions:  PRN Meds:.acetaminophen, benzonatate, guaiFENesin-dextromethorphan, hydrALAZINE, levalbuterol, tiZANidine    Subjective:   Dana Burns was seen and examined today.   Reports not feeling great, some chest pressure yesterday.   Objective:   Vitals:   01/13/23 0601 01/13/23 0928 01/13/23 1033 01/13/23 1426   BP: (!) 148/95   122/66  Pulse: 98   81  Resp: 16   16  Temp: 98.3 F (36.8 C)   (!) 97.4 F (36.3 C)  TempSrc: Oral   Oral  SpO2: 94% 94% 94% 97%  Weight:      Height:        Intake/Output Summary (Last 24 hours) at 01/13/2023 1608 Last data filed at 01/13/2023 1400 Gross per 24 hour  Intake 720 ml  Output 600 ml  Net 120 ml    Filed Weights   01/11/23 1138  Weight: 59.9 kg     Exam General exam: Appears calm and comfortable  Respiratory system: diminished air entry at bases. On 1  lit of [place] oxygen.  Cardiovascular system: S1 & S2 heard, irregularly irregular,  No JVD, murmurs, No pedal edema. Gastrointestinal system: Abdomen is nondistended, soft and nontender.  Central nervous system: Alert and oriented. No focal neurological deficits. Extremities: Symmetric 5 x 5 power. Skin: No rashes,  Psychiatry:  Mood & affect appropriate.       Data Reviewed:  I have personally reviewed following labs and imaging studies   CBC Lab Results  Component Value Date   WBC 6.0 01/11/2023   RBC 4.53 01/11/2023   HGB 12.4 01/11/2023   HCT 39.4 01/11/2023   MCV 87.0 01/11/2023   MCH 27.4 01/11/2023   PLT 240 01/11/2023   MCHC 31.5 01/11/2023   RDW 14.4 01/11/2023   LYMPHSABS 1.0 01/11/2023   MONOABS 0.6 01/11/2023   EOSABS 0.0 01/11/2023   BASOSABS 0.0 61/44/3154     Last metabolic panel Lab Results  Component Value Date   NA 138 01/11/2023   K 3.9 01/11/2023   CL 106 01/11/2023   CO2 25 01/11/2023   BUN 35 (H) 01/11/2023   CREATININE 0.96 01/11/2023   GLUCOSE 92 01/11/2023   GFRNONAA 55 (L) 01/11/2023   GFRAA 37 (L) 11/13/2016   CALCIUM 9.0 01/11/2023   PHOS 3.4 09/07/2014   PROT 7.8 01/08/2023   ALBUMIN 3.9 01/08/2023   LABGLOB 2.2 06/21/2014   AGRATIO 2.0 06/21/2014   BILITOT 1.0 01/08/2023   ALKPHOS 53 01/08/2023   AST 26 01/08/2023   ALT 14 01/08/2023   ANIONGAP 7 01/11/2023    CBG (last 3)  Recent Labs    01/12/23 2303 01/13/23 0753  01/13/23 1155  GLUCAP 102*  98 180*       Coagulation Profile: No results for input(s): "INR", "PROTIME" in the last 168 hours.   Radiology Studies: No results found.     Hosie Poisson M.D. [facility] 01/13/2023, 4:08 PM  Available via Epic secure chat 7am-7pm After 7 pm, please refer to night coverage provider listed on amion.

## 2023-01-14 ENCOUNTER — Inpatient Hospital Stay (HOSPITAL_COMMUNITY): Payer: Medicare Other

## 2023-01-14 DIAGNOSIS — I4891 Unspecified atrial fibrillation: Secondary | ICD-10-CM

## 2023-01-14 DIAGNOSIS — E1142 Type 2 diabetes mellitus with diabetic polyneuropathy: Secondary | ICD-10-CM | POA: Diagnosis not present

## 2023-01-14 DIAGNOSIS — J210 Acute bronchiolitis due to respiratory syncytial virus: Secondary | ICD-10-CM | POA: Diagnosis not present

## 2023-01-14 DIAGNOSIS — I10 Essential (primary) hypertension: Secondary | ICD-10-CM | POA: Diagnosis not present

## 2023-01-14 LAB — CBC WITH DIFFERENTIAL/PLATELET
Abs Immature Granulocytes: 0.02 10*3/uL (ref 0.00–0.07)
Basophils Absolute: 0 10*3/uL (ref 0.0–0.1)
Basophils Relative: 0 %
Eosinophils Absolute: 0 10*3/uL (ref 0.0–0.5)
Eosinophils Relative: 0 %
HCT: 46.9 % — ABNORMAL HIGH (ref 36.0–46.0)
Hemoglobin: 14.1 g/dL (ref 12.0–15.0)
Immature Granulocytes: 0 %
Lymphocytes Relative: 23 %
Lymphs Abs: 1.8 10*3/uL (ref 0.7–4.0)
MCH: 27.5 pg (ref 26.0–34.0)
MCHC: 30.1 g/dL (ref 30.0–36.0)
MCV: 91.6 fL (ref 80.0–100.0)
Monocytes Absolute: 0.7 10*3/uL (ref 0.1–1.0)
Monocytes Relative: 8 %
Neutro Abs: 5.4 10*3/uL (ref 1.7–7.7)
Neutrophils Relative %: 69 %
Platelets: 285 10*3/uL (ref 150–400)
RBC: 5.12 MIL/uL — ABNORMAL HIGH (ref 3.87–5.11)
RDW: 14.6 % (ref 11.5–15.5)
WBC: 7.9 10*3/uL (ref 4.0–10.5)
nRBC: 0 % (ref 0.0–0.2)

## 2023-01-14 LAB — GLUCOSE, CAPILLARY
Glucose-Capillary: 118 mg/dL — ABNORMAL HIGH (ref 70–99)
Glucose-Capillary: 308 mg/dL — ABNORMAL HIGH (ref 70–99)
Glucose-Capillary: 146 mg/dL — ABNORMAL HIGH (ref 70–99)
Glucose-Capillary: 226 mg/dL — ABNORMAL HIGH (ref 70–99)

## 2023-01-14 LAB — BASIC METABOLIC PANEL
Anion gap: 11 (ref 5–15)
BUN: 22 mg/dL (ref 8–23)
CO2: 24 mmol/L (ref 22–32)
Calcium: 9 mg/dL (ref 8.9–10.3)
Chloride: 103 mmol/L (ref 98–111)
Creatinine, Ser: 1.28 mg/dL — ABNORMAL HIGH (ref 0.44–1.00)
GFR, Estimated: 39 mL/min — ABNORMAL LOW (ref >60)
Glucose, Bld: 189 mg/dL — ABNORMAL HIGH (ref 70–99)
Potassium: 3.8 mmol/L (ref 3.5–5.1)
Sodium: 138 mmol/L (ref 135–145)

## 2023-01-14 LAB — ECHOCARDIOGRAM COMPLETE
AR max vel: 2.24 cm2
AV Area VTI: 2.46 cm2
AV Area mean vel: 2.14 cm2
AV Mean grad: 4.7 mmHg
AV Peak grad: 8.3 mmHg
Ao pk vel: 1.44 m/s
Area-P 1/2: 3.88 cm2
Calc EF: 54.1 %
Height: 66 in
MV M vel: 2.67 m/s
MV Peak grad: 28.5 mmHg
P 1/2 time: 401 msec
S' Lateral: 2.5 cm
Single Plane A2C EF: 57.9 %
Single Plane A4C EF: 51.7 %
Weight: 2112 oz

## 2023-01-14 LAB — TSH: TSH: 2.191 u[IU]/mL (ref 0.350–4.500)

## 2023-01-14 LAB — TROPONIN I (HIGH SENSITIVITY)
Troponin I (High Sensitivity): 15 ng/L (ref <18)
Troponin I (High Sensitivity): 15 ng/L (ref <18)

## 2023-01-14 MED ORDER — METOPROLOL TARTRATE 25 MG PO TABS
12.5000 mg | ORAL_TABLET | Freq: Four times a day (QID) | ORAL | Status: DC
  Administered 2023-01-14: 12.5 mg via ORAL
  Filled 2023-01-14 (×3): qty 1

## 2023-01-14 MED ORDER — APIXABAN 2.5 MG PO TABS
2.5000 mg | ORAL_TABLET | Freq: Two times a day (BID) | ORAL | Status: DC
  Administered 2023-01-14 – 2023-01-17 (×7): 2.5 mg via ORAL
  Filled 2023-01-14 (×7): qty 1

## 2023-01-14 MED ORDER — METOPROLOL TARTRATE 25 MG PO TABS: 12.5000 mg | ORAL_TABLET | Freq: Four times a day (QID) | ORAL | Status: DC

## 2023-01-14 MED ORDER — LACTATED RINGERS IV SOLN: INTRAVENOUS | Status: DC

## 2023-01-14 MED ORDER — LACTATED RINGERS IV BOLUS
500.0000 mL | Freq: Once | INTRAVENOUS | Status: AC
  Administered 2023-01-14: 500 mL via INTRAVENOUS

## 2023-01-14 MED ORDER — DIAZEPAM 2 MG PO TABS
1.0000 mg | ORAL_TABLET | Freq: Two times a day (BID) | ORAL | Status: DC | PRN
  Administered 2023-01-14: 1 mg via ORAL
  Filled 2023-01-14: qty 1

## 2023-01-14 NOTE — Progress Notes (Signed)
Physical Therapy Treatment Patient Details Name: Dana Burns MRN: [id number] DOB: [birth year] Today's Date: 01/14/2023   History of Present Illness 89 y.o. female with medical history significant of DM2, HTN, colon cancer, fibromyalgia, chronic pain.  Pt presents to ED with 7 day h/o nonproductive cough, fever, chills, anorexia, worsening generalized weakness. She reported a few episodes of vomiting on Thurs, occasional diarrhea. Dx: RSV bronchitis    PT Comments    Pt assisted with sitting EOB however felt unable to stand or transfer at time of session due to c/o fatigue and R LE pain (see mobility section below).  Pt's HR elevated to 147 bpm with bed mobility and RN aware.  Pt will likely need some assist upon d/c home.    Recommendations for follow up therapy are one component of a multi-disciplinary discharge planning process, led by the attending physician.  Recommendations may be updated based on patient status, additional functional criteria and insurance authorization.  Follow Up Recommendations  Home health PT     Assistance Recommended at Discharge Intermittent Supervision/Assistance  Patient can return home with the following A little help with walking and/or transfers;A little help with bathing/dressing/bathroom;Assistance with cooking/housework;Assist for transportation;Help with stairs or ramp for entrance   Equipment Recommendations  None recommended by PT    Recommendations for Other Services       Precautions / Restrictions Precautions Precautions: Fall Precaution Comments: monitor HR     Mobility  Bed Mobility Overal bed mobility: Needs Assistance Bed Mobility: Supine to Sit, Sit to Supine     Supine to sit: Min assist Sit to supine: Min assist   General bed mobility comments: assist for upper body upright and LEs onto bed, pt fatigues quickly, HR up to 147 bpm (RN reports increase in HR with pt up to [facility] as well); SpO2 98% on 2L O2; pt reports being  unable to stand or transfer at this time due to c/o R LE pain which daughter reports is chronic, pt has been to multiple MDs about leg pain and has had no dx or explanation for pain    Transfers                        Ambulation/Gait                   Stairs             Wheelchair Mobility    Modified Rankin (Stroke Patients Only)       Balance                                            Cognition Arousal/Alertness: Awake/alert Behavior During Therapy: WFL for tasks assessed/performed Overall Cognitive Status: Within Functional Limits for tasks assessed                                          Exercises      General Comments        Pertinent Vitals/Pain Pain Assessment Pain Assessment: Faces Faces Pain Scale: Hurts whole lot Pain Location: Rt LE Pain Descriptors / Indicators: Grimacing, Guarding, Tender Pain Intervention(s): Repositioned, Monitored during session, Patient requesting pain meds-RN notified    Home Living  Prior Function            PT Goals (current goals can now be found in the care plan section) Progress towards PT goals: Progressing toward goals    Frequency    Min 3X/week      PT Plan Current plan remains appropriate    Co-evaluation              AM-PAC PT "6 Clicks" Mobility   Outcome Measure  Help needed turning from your back to your side while in a flat bed without using bedrails?: None Help needed moving from lying on your back to sitting on the side of a flat bed without using bedrails?: A Little Help needed moving to and from a bed to a chair (including a wheelchair)?: A Little Help needed standing up from a chair using your arms (e.g., wheelchair or bedside chair)?: A Little Help needed to walk in hospital room?: A Little Help needed climbing 3-5 steps with a railing? : A Little 6 Click Score: 19    End of Session  Equipment Utilized During Treatment: Oxygen Activity Tolerance: Patient limited by fatigue;Patient limited by pain Patient left: in bed;with call bell/phone within reach;with family/visitor present Nurse Communication: Mobility status;Patient requests pain meds PT Visit Diagnosis: Difficulty in walking, not elsewhere classified (R26.2)     Time: 3818-4037 PT Time Calculation (min) (ACUTE ONLY): 15 min  Charges:  $Therapeutic Activity: 8-22 mins                     Jannette Spanner PT, DPT Physical Therapist Acute Rehabilitation Services Preferred contact method: Secure Chat Weekend Pager Only: [phone] Office: [place] 01/14/2023, 3:57 PM

## 2023-01-14 NOTE — Progress Notes (Signed)
  Echocardiogram 2D Echocardiogram has been performed.  Dana Burns 01/14/2023, 1:42 PM

## 2023-01-14 NOTE — Progress Notes (Signed)
[facility]                                                                               Dana Burns, is a 89 y.o. female, DOB - 11-Feb-1926, PJK:[id number] Admit date - 01/08/2023    Outpatient Primary MD for the patient is Mosie Lukes, MD  LOS - 4  days    Brief summary   Dana Burns is a 89 y.o. female with medical history significant of DM2, HTN.   Pt presents to ED with 7 day h/o nonproductive cough, fever, chills, anorexia, worsening generalized weakness.  She was admitted for RSV bronchiolitis.     Assessment & Plan    Assessment and Plan: * RSV bronchiolitis/ acute respiratory failure with hypoxia requiring about 2 to 3 lit of [place] oxygen.  On exam today,  wheezing has improved. Cough has improved. She was off oxygen and on RA sat 95%.  Recommend checking ambulating oxygen levels today.  She was started on IV solumedrol and transitioned to prednisone to complete the course.  She continues to require 2l it of [place] oxygen, will need ambulating oxygen on discharge.  Discussed the plan with the patient , her daughter and her grand son over the phone.  CT chest showing atypical infection. Started her on azithromycin. Sputum cultures have been ordered.  Continue with duonebs and cough meds as needed.  Recommend weaning her oxygen off slowly in the next 24 hours.  Therapy evaluations have been ordered. Will order home health PT/OT .    Type 2 diabetes mellitus with peripheral neuropathy (HCC)  Started her on SSI.  CBG (last 3)  Recent Labs    01/13/23 2129 01/14/23 0737 01/14/23 1152  GLUCAP 155* 118* 308*   Continue with SSI.  A1c is 6.8%   Essential hypertension, benign BP parameters are OPTIMAL.   New onset of atrial fibrillation with RVR:  Rate better. Around 90's. Unclear etiology.  TSH is pending. , echocardiogram and transfer her to telemetry.  Monitor overnight on telemetry.  Discussed with the patient's family over the phone.   Cardiology consulted and she was started on low dose eliquis for anti coagulation and metoprolol for rate control.     Aki  Suspect from poor oral intake and dehydration.  Started on IV fluids. Recheck renal parameters in am.   Interventions: Ensure Enlive (each supplement provides 350kcal and 20 grams of protein), MVI  Estimated body mass index is 21.31 kg/m as calculated from the following:   Height as of this encounter: '5\' 6"'$  (1.676 m).   Weight as of this encounter: 59.9 kg.  Code Status: DNR  DVT Prophylaxis:  apixaban (ELIQUIS) tablet 2.5 mg Start: 01/14/23 1400 apixaban (ELIQUIS) tablet 2.5 mg   Level of Care: Level of care: Telemetry Family Communication: none at bedside.   Disposition Plan:     Remains inpatient appropriate:  echocardiogram is pending.   Procedures:  ECHO.   Consultants:   Cardiology.   Antimicrobials:   Anti-infectives (From admission, onward)    Start     Dose/Rate Route Frequency Ordered Stop   01/09/23 1045  azithromycin (ZITHROMAX) tablet 500 mg  500 mg Oral Daily 01/09/23 0958 01/13/23 1000        Medications  Scheduled Meds:  apixaban  2.5 mg Oral BID   citalopram  20 mg Oral Daily   diazepam  2.5 mg Oral Daily   famotidine  40 mg Oral QHS   feeding supplement  237 mL Oral BID BM   gabapentin  800 mg Oral TID   insulin aspart  0-15 Units Subcutaneous TID WC   metoprolol tartrate  12.5 mg Oral Q6H   mometasone-formoterol  2 puff Inhalation BID   multivitamin with minerals  1 tablet Oral Daily   predniSONE  40 mg Oral QAC breakfast   rOPINIRole  1 mg Oral QHS   umeclidinium bromide  1 puff Inhalation Daily   venlafaxine XR  37.5 mg Oral Q breakfast   Continuous Infusions:  lactated ringers 75 mL/hr at 01/14/23 1401    PRN Meds:.acetaminophen, benzonatate, diazepam, guaiFENesin-dextromethorphan, hydrALAZINE, levalbuterol, tiZANidine    Subjective:   Dana Burns was seen and examined today.   Reports not feeling  great, some chest pressure yesterday.   Objective:   Vitals:   01/14/23 0357 01/14/23 0747 01/14/23 1005 01/14/23 1044  BP: 105/68 (!) 107/46 (!) 96/41 98/60  Pulse: 95 85 (!) 51 90  Resp: '16 20  20  '$ Temp: 97.6 F (36.4 C) 97.6 F (36.4 C) (!) 97.3 F (36.3 C)   TempSrc: Oral Oral Oral   SpO2: 96% 97% 99% 99%  Weight:      Height:        Intake/Output Summary (Last 24 hours) at 01/14/2023 1713 Last data filed at 01/14/2023 1401 Gross per 24 hour  Intake 1567 ml  Output 600 ml  Net 967 ml    Filed Weights   01/11/23 1138  Weight: 59.9 kg     Exam General exam: Appears calm and comfortable  Respiratory system: Clear to auscultation. Respiratory effort normal. Cardiovascular system: S1 & S2 heard, irregularly irregular.  No JVD,  No pedal edema. Gastrointestinal system: Abdomen is nondistended, soft and nontender. Central nervous system: Alert and oriented. No focal neurological deficits. Extremities: Symmetric 5 x 5 power. Skin: No rashes,  Psychiatry:  Mood & affect appropriate.        Data Reviewed:  I have personally reviewed following labs and imaging studies   CBC Lab Results  Component Value Date   WBC 7.9 01/14/2023   RBC 5.12 (H) 01/14/2023   HGB 14.1 01/14/2023   HCT 46.9 (H) 01/14/2023   MCV 91.6 01/14/2023   MCH 27.5 01/14/2023   PLT 285 01/14/2023   MCHC 30.1 01/14/2023   RDW 14.6 01/14/2023   LYMPHSABS 1.8 01/14/2023   MONOABS 0.7 01/14/2023   EOSABS 0.0 01/14/2023   BASOSABS 0.0 62/26/3335     Last metabolic panel Lab Results  Component Value Date   NA 138 01/14/2023   K 3.8 01/14/2023   CL 103 01/14/2023   CO2 24 01/14/2023   BUN 22 01/14/2023   CREATININE 1.28 (H) 01/14/2023   GLUCOSE 189 (H) 01/14/2023   GFRNONAA 39 (L) 01/14/2023   GFRAA 37 (L) 11/13/2016   CALCIUM 9.0 01/14/2023   PHOS 3.4 09/07/2014   PROT 7.8 01/08/2023   ALBUMIN 3.9 01/08/2023   LABGLOB 2.2 06/21/2014   AGRATIO 2.0 06/21/2014   BILITOT 1.0  01/08/2023   ALKPHOS 53 01/08/2023   AST 26 01/08/2023   ALT 14 01/08/2023   ANIONGAP 11 01/14/2023    CBG (last 3)  Recent Labs    01/13/23 2129 01/14/23 0737 01/14/23 1152  GLUCAP 155* 118* 308*       Coagulation Profile: No results for input(s): "INR", "PROTIME" in the last 168 hours.   Radiology Studies: ECHOCARDIOGRAM COMPLETE  Result Date: 01/14/2023    ECHOCARDIOGRAM REPORT   Patient Name:   Dana Burns Date of Exam: 01/14/2023 Medical Rec #:  [id number]     Height:       66.0 in Accession #:    [id number]    Weight:       132.0 lb Date of Birth:  1926/09/01     BSA:          1.676 m Patient Age:    11 years      BP:           107/46 mmHg Patient Gender: F             HR:           113 bpm. Exam Location:  Inpatient Procedure: 2D Echo Indications:    atrial fibrillation  History:        Patient has prior history of Echocardiogram examinations, most                 recent 08/03/2021. Arrythmias:Atrial Fibrillation and Tachycardia;                 Risk Factors:Diabetes and Hypertension.  Sonographer:    Harvie Junior Referring Phys: Theola Sequin  Sonographer Comments: Technically difficult study due to poor echo windows and suboptimal subcostal window. IMPRESSIONS  1. Left ventricular ejection fraction, by estimation, is 60 to 65%. The left ventricle has normal function. The left ventricle has no regional wall motion abnormalities. Left ventricular diastolic parameters are indeterminate.  2. Right ventricular systolic function is normal. The right ventricular size is normal. There is normal pulmonary artery systolic pressure.  3. Left atrial size was mildly dilated.  4. Right atrial size was mildly dilated.  5. The mitral valve is normal in structure. No evidence of mitral valve regurgitation. No evidence of mitral stenosis.  6. The aortic valve is tricuspid. There is mild calcification of the aortic valve. There is mild thickening of the aortic valve. Aortic valve regurgitation is  trivial. Aortic valve sclerosis is present, with no evidence of aortic valve stenosis.  7. The inferior vena cava is normal in size with greater than 50% respiratory variability, suggesting right atrial pressure of 3 mmHg. FINDINGS  Left Ventricle: Left ventricular ejection fraction, by estimation, is 60 to 65%. The left ventricle has normal function. The left ventricle has no regional wall motion abnormalities. The left ventricular internal cavity size was normal in size. There is  no left ventricular hypertrophy. Left ventricular diastolic parameters are indeterminate. Right Ventricle: The right ventricular size is normal. No increase in right ventricular wall thickness. Right ventricular systolic function is normal. There is normal pulmonary artery systolic pressure. The tricuspid regurgitant velocity is 2.54 m/s, and  with an assumed right atrial pressure of 3 mmHg, the estimated right ventricular systolic pressure is 62.6 mmHg. Left Atrium: Left atrial size was mildly dilated. Right Atrium: Right atrial size was mildly dilated. Pericardium: There is no evidence of pericardial effusion. Mitral Valve: The mitral valve is normal in structure. No evidence of mitral valve regurgitation. No evidence of mitral valve stenosis. Tricuspid Valve: The tricuspid valve is normal in structure. Tricuspid valve regurgitation is mild . No evidence of tricuspid stenosis. Aortic  Valve: The aortic valve is tricuspid. There is mild calcification of the aortic valve. There is mild thickening of the aortic valve. Aortic valve regurgitation is trivial. Aortic regurgitation PHT measures 401 msec. Aortic valve sclerosis is present, with no evidence of aortic valve stenosis. Aortic valve mean gradient measures 4.7 mmHg. Aortic valve peak gradient measures 8.3 mmHg. Aortic valve area, by VTI measures 2.46 cm. Pulmonic Valve: The pulmonic valve was normal in structure. Pulmonic valve regurgitation is not visualized. No evidence of pulmonic  stenosis. Aorta: The aortic root is normal in size and structure. Venous: The inferior vena cava is normal in size with greater than 50% respiratory variability, suggesting right atrial pressure of 3 mmHg. IAS/Shunts: No atrial level shunt detected by color flow Doppler.  LEFT VENTRICLE PLAX 2D LVIDd:         3.70 cm     Diastology LVIDs:         2.50 cm     LV e' medial:    5.91 cm/s LV PW:         1.00 cm     LV E/e' medial:  14.1 LV IVS:        1.00 cm     LV e' lateral:   6.85 cm/s LVOT diam:     1.80 cm     LV E/e' lateral: 12.2 LV SV:         46 LV SV Index:   27 LVOT Area:     2.54 cm  LV Volumes (MOD) LV vol d, MOD A2C: 51.3 ml LV vol d, MOD A4C: 48.9 ml LV vol s, MOD A2C: 21.6 ml LV vol s, MOD A4C: 23.6 ml LV SV MOD A2C:     29.7 ml LV SV MOD A4C:     48.9 ml LV SV MOD BP:      27.6 ml RIGHT VENTRICLE RV Basal diam:  3.40 cm RV Mid diam:    3.00 cm RV S prime:     13.97 cm/s TAPSE (M-mode): 1.2 cm LEFT ATRIUM           Index        RIGHT ATRIUM           Index LA diam:      2.90 cm 1.73 cm/m   RA Area:     13.20 cm LA Vol (A4C): 41.5 ml 24.76 ml/m  RA Volume:   31.90 ml  19.03 ml/m  AORTIC VALVE                    PULMONIC VALVE AV Area (Vmax):    2.24 cm     PV Vmax:       1.05 m/s AV Area (Vmean):   2.14 cm     PV Peak grad:  4.4 mmHg AV Area (VTI):     2.46 cm AV Vmax:           144.00 cm/s AV Vmean:          97.033 cm/s AV VTI:            0.185 m AV Peak Grad:      8.3 mmHg AV Mean Grad:      4.7 mmHg LVOT Vmax:         127.00 cm/s LVOT Vmean:        81.633 cm/s LVOT VTI:          0.179 m LVOT/AV VTI ratio: 0.97 AI PHT:  401 msec  AORTA Ao Root diam: 3.10 cm Ao Asc diam:  2.70 cm MITRAL VALVE               TRICUSPID VALVE MV Area (PHT): 3.88 cm    TR Peak grad:   25.8 mmHg MV Decel Time: 196 msec    TR Vmax:        254.00 cm/s MR Peak grad: 28.5 mmHg MR Vmax:      267.00 cm/s  SHUNTS MV E velocity: 83.43 cm/s  Systemic VTI:  0.18 m                            Systemic Diam: 1.80 cm  Jenkins Rouge MD Electronically signed by Jenkins Rouge MD Signature Date/Time: 01/14/2023/1:42:21 PM    Final        Hosie Poisson M.D. [facility] 01/14/2023, 5:13 PM  Available via Epic secure chat 7am-7pm After 7 pm, please refer to night coverage provider listed on amion.

## 2023-01-14 NOTE — Care Management Important Message (Signed)
Important Message  Patient Details IM Letter placed in room. Name: Dana Burns MRN: [id number] Date of Birth: [birth year]   Medicare Important Message Given:  Yes     Kerin Salen 01/14/2023, 1:41 PM

## 2023-01-14 NOTE — Consult Note (Signed)
Cardiology Consultation   Patient ID: [name] MRN: [id number]; DOB: [birth year]  Admit date: 01/08/2023 Date of Consult: 01/14/2023  PCP:  Mosie Lukes, MD   [place] Providers Cardiologist:  None        Patient Profile:   Dana Burns is a 89 y.o. female with a hx of DM type II, hypertension, colon cancer, mixed dyslipidemia, fibromyalgia who is being seen 01/14/2023 for the evaluation of atrial fibrillation at the request of Dr. Karleen Hampshire.  History of Present Illness:   Dana Burns presented to the hospital on 1/9 following a week of non-productive cough, fever, chills, poor oral intake, worsening weakness. Initial workup was notable for RSV on viral pathogen panel and patient was admitted for management of bronchiolitis. Patient initially required oxygen at 2-3 LPM but has intermittently been tolerating room air. CT chest with evidence of atypical pneumonia and patient was initiated on Azithromycin. She received IV solumedrol '40mg'$  QD and was transitioned to oral prednisone on 1/14. Patient with onset of afib on 1/14, ventricular rates have been fluctuant, between 70s-130s per telemetry.  Chart review shows that patient saw Dr. [name] in July of 2022. Patient reported sensation of palpitations and in the setting of a recent fall, a heart monitor was ordered. This was without significant arrhythmia. Patient also had an echocardiogram completed which noted LVEF 55-60%, severe concentric LVH with grade III diastolic dysfunction. Patient appears to have been lost to cardiology follow up after this echo. Today patient reports intermittent and vague chest discomfort that is not associated with exertion but rather with certain positions. She denies palpitations or significant shortness of breath this morning.   Past Medical History:  Diagnosis Date   Abdominal pain 2017/10/05   Allergy    sneeze, rhinorrhea in am   Anxiety state 09/03/2013   Breast mass, right 10/14/2012    Chicken pox as a child   Chronic pain syndrome 01/23/2015   Chronic renal insufficiency 03/17/2011   Colon cancer ([place])    Complication of anesthesia    agitated when waking up, wakes up shaking, "like  I'm going into shock"   Constipation 10-05-17   Depression    husband died  3 months ago, pt. admits depression  currently    Diabetic neuropathy ([place]) 03/17/2011   DM (diabetes mellitus) type 2, uncontrolled, with ketoacidosis ([place]) 03/17/2011   Ductal hyperplasia of breast 03/17/2011   Essential hypertension, benign 05/31/2010   Qualifier: Diagnosis of  By: Percival Spanish, MD, Bethann Goo 07/15/2021   Fibromyalgia    GERD (gastroesophageal reflux disease)    H/O echocardiogram    done /w [place] in 2011, saw Dr. Percival Spanish for tachycardia    Headache    History of colon cancer 03/17/2011   Hyperlipidemia    6 years   Hyperlipidemia, mixed    6 years   Hypertension    15 years, reports she has a rapid heartrate    Loss of weight 12/14/2013   Measles as a child   Osteoarthritis (arthritis due to wear and tear of joints) 03/17/2011   Noted diffusely including neck   Osteopenia 12/14/2013   Pain in finger of right hand 05/15/2017   Pain in joint, lower leg 09/03/2013   Pain of right shoulder region 07/07/2014   Peripheral neuropathy    SOB (shortness of breath) 05/31/2010   Qualifier: Diagnosis of  By: Percival Spanish MD, Farrel Gordon     Stress 06/01/2013   Tachycardia  05/31/2010   Qualifier: Diagnosis of  By: Percival Spanish, MD, Farrel Gordon     Type 2 diabetes mellitus with peripheral neuropathy ([place]) 03/17/2011   Vitamin B 12 deficiency 07/07/2014   intrinsic factor positive    Past Surgical History:  Procedure Laterality Date   BREAST EXCISIONAL BIOPSY Right 2013   benign   BREAST SURGERY  2013   right- benign   CATARACT EXTRACTION     /w IOL- bilateral    COLON SURGERY     For resection of cancer   Nodule removed     From throat     Home Medications:  Prior to Admission medications    Medication Sig Start Date End Date Taking? Authorizing Provider  acetaminophen (TYLENOL) 500 MG tablet Take 500 mg by mouth every 6 (six) hours as needed for pain.   Yes [provider]  calcium carbonate (OS-CAL) 600 MG TABS Take 600 mg by mouth 2 (two) times daily with a meal.   Yes [provider]  Cholecalciferol (VITAMIN D3) 50 MCG (2000 UT) capsule Take 2,000 Units by mouth daily.    Yes [provider]  Cyanocobalamin (B-12 IJ) Place 1,000 mcg under the tongue daily.   Yes [provider]  diazepam (VALIUM) 5 MG tablet TAKE 1/2 TO 1 TABLET DAILY AS NEEDED FOR ANXIETY Patient taking differently: Take 2.5 mg by mouth daily. 11/20/22  Yes Mosie Lukes, MD  doxycycline (VIBRAMYCIN) 100 MG capsule Take 1 capsule (100 mg total) by mouth 2 (two) times daily. 01/07/23  Yes Copland, Gay Filler, MD  famotidine (PEPCID) 40 MG tablet Take 1 tablet (40 mg total) by mouth at bedtime. 11/20/22  Yes Mosie Lukes, MD  gabapentin (NEURONTIN) 800 MG tablet Take 1 tablet (800 mg total) by mouth 3 (three) times daily. 11/20/22  Yes Mosie Lukes, MD  ondansetron (ZOFRAN-ODT) 4 MG disintegrating tablet Take 1 tablet (4 mg total) by mouth every 8 (eight) hours as needed for nausea or vomiting. 12/03/22  Yes Wendling, Crosby Oyster, DO  rOPINIRole (REQUIP) 1 MG tablet Take 1 tablet (1 mg total) by mouth at bedtime. 08/14/22  Yes Mosie Lukes, MD  tiZANidine (ZANAFLEX) 4 MG tablet Take 1 tablet (4 mg total) by mouth 2 (two) times daily as needed for muscle spasms. 08/14/22  Yes Mosie Lukes, MD  venlafaxine XR (EFFEXOR-XR) 37.5 MG 24 hr capsule Take 1 capsule (37.5 mg total) by mouth daily with breakfast. 08/14/22  Yes Mosie Lukes, MD  citalopram (CELEXA) 20 MG tablet TAKE 1 TABLET BY MOUTH EVERY DAY 01/09/23   Mosie Lukes, MD  HYDROcodone bit-homatropine (HYCODAN) 5-1.5 MG/5ML syrup Take 5 mLs by mouth every 8 (eight) hours as needed for cough. Patient not taking:  Reported on 01/08/2023 12/03/22   Shelda Pal, DO  HYDROcodone-acetaminophen [facility]) 5-325 MG tablet Take 1 tablet by mouth 3 (three) times daily as needed for moderate pain. Patient not taking: Reported on 01/08/2023 11/20/22   Mosie Lukes, MD  pantoprazole (PROTONIX) 40 MG tablet Take 1 tablet (40 mg total) by mouth daily. Patient not taking: Reported on 01/08/2023 04/30/22   Terrilyn Saver, NP    Inpatient Medications: Scheduled Meds:  apixaban  2.5 mg Oral BID   citalopram  20 mg Oral Daily   diazepam  2.5 mg Oral Daily   famotidine  40 mg Oral QHS   feeding supplement  237 mL Oral BID BM   gabapentin  800 mg Oral  TID   insulin aspart  0-15 Units Subcutaneous TID WC   metoprolol tartrate  12.5 mg Oral Q6H   mometasone-formoterol  2 puff Inhalation BID   multivitamin with minerals  1 tablet Oral Daily   predniSONE  40 mg Oral QAC breakfast   rOPINIRole  1 mg Oral QHS   umeclidinium bromide  1 puff Inhalation Daily   venlafaxine XR  37.5 mg Oral Q breakfast   Continuous Infusions:  lactated ringers     lactated ringers     PRN Meds: acetaminophen, benzonatate, guaiFENesin-dextromethorphan, hydrALAZINE, levalbuterol, tiZANidine  Allergies:    Allergies  Allergen Reactions   Tape Itching and Rash   Actos [Pioglitazone Hydrochloride] Other (See Comments)    Unknown reaction   Buspar [Buspirone Hcl] Other (See Comments)    Unknown reaction   Doxycycline Other (See Comments)    Made the patient "feel funny"- in a negative way   Lipitor [Atorvastatin Calcium] Other (See Comments)    "It made me hurt all over."   Penicillins Rash    Social History:   Social History   Socioeconomic History   Marital status: Widowed    Spouse name: Not on file   Number of children: 1   Years of education: 9   Highest education level: Not on file  Occupational History   Occupation: Retired  Tobacco Use   Smoking status: Never   Smokeless tobacco: Never  Substance and Sexual  Activity   Alcohol use: No   Drug use: No   Sexual activity: Never    Comment: lives with daughter. no dietary restrictions  Other Topics Concern   Not on file  Social History Narrative   Patient lives at home with daughter.    Patient is retired.    Patient is widowed.    Patient has 1 child.    Patient has a 9th grade education.    Social Determinants of Health   Financial Resource Strain: Low Risk  (03/01/2022)   Overall Financial Resource Strain (CARDIA)    Difficulty of Paying Living Expenses: Not hard at all  Food Insecurity: No Food Insecurity (01/09/2023)   Hunger Vital Sign    Worried About Running Out of Food in the Last Year: Never true    Ran Out of Food in the Last Year: Never true  Transportation Needs: No Transportation Needs (01/09/2023)   PRAPARE - Hydrologist (Medical): No    Lack of Transportation (Non-Medical): No  Physical Activity: Inactive (03/01/2022)   Exercise Vital Sign    Days of Exercise per Week: 0 days    Minutes of Exercise per Session: 0 min  Stress: No Stress Concern Present (03/01/2022)   [place]    Feeling of Stress : Not at all  Social Connections: Not on file  Intimate Partner Violence: Not At Risk (01/09/2023)   Humiliation, Afraid, Rape, and Kick questionnaire    Fear of Current or Ex-Partner: No    Emotionally Abused: No    Physically Abused: No    Sexually Abused: No    Family History:    Family History  Problem Relation Age of Onset   Cancer Mother        Brain   Cancer Father        Colorectal   Cancer Sister        breast   Other Brother        pacemaker  Cancer Brother        pancreatic cancer   Arthritis Sister    Emphysema Brother    Cancer Brother    COPD Brother    Neuropathy Daughter    Fibromyalgia Daughter    Cancer Brother        metastatic colon cancer   Heart disease Neg Hx        Early     ROS:  Please  see the history of present illness.   All other ROS reviewed and negative.     Physical Exam/Data:   Vitals:   01/14/23 0357 01/14/23 0747 01/14/23 1005 01/14/23 1044  BP: 105/68 (!) 107/46 (!) 96/41 98/60  Pulse: 95 85 (!) 51 90  Resp: '16 20  20  '$ Temp: 97.6 F (36.4 C) 97.6 F (36.4 C) (!) 97.3 F (36.3 C)   TempSrc: Oral Oral Oral   SpO2: 96% 97% 99% 99%  Weight:      Height:        Intake/Output Summary (Last 24 hours) at 01/14/2023 1139 Last data filed at 01/14/2023 0817 Gross per 24 hour  Intake 840 ml  Output 600 ml  Net 240 ml      01/11/2023   11:38 AM 11/20/2022    3:15 PM 08/14/2022    2:55 PM  Last 3 Weights  Weight (lbs) 132 lb 140 lb 136 lb 9.6 oz  Weight (kg) 59.875 kg 63.504 kg 61.961 kg     Body mass index is 21.31 kg/m.  General:  Well nourished, well developed, in no acute distress HEENT: normal Neck: no JVD Vascular: No carotid bruits; Distal pulses 2+ bilaterally Cardiac:  normal S1, S2; irregularly irregular and rapid; no murmur  Lungs:  clear to auscultation bilaterally, no wheezing, rhonchi or rales  Abd: soft, nontender, no hepatomegaly  Ext: no edema Musculoskeletal:  No deformities, BUE and BLE strength normal and equal Skin: warm and dry  Neuro:  CNs 2-12 intact, no focal abnormalities noted Psych:  Normal affect   EKG:  The EKG was personally reviewed and demonstrates:  afib with RVR, left anterior fascicular block pattern noted. Telemetry:  Telemetry was personally reviewed and demonstrates:  atrial fibrillation with RVR. Rates fluctuating between 70s-130s.  Relevant CV Studies:  08/03/21 TTE  IMPRESSIONS     1. Left ventricular ejection fraction, by estimation, is 55 to 60%. The  left ventricle has normal function. Left ventricular endocardial border  not optimally defined to evaluate regional wall motion. There is severe  concentric left ventricular  hypertrophy. Left ventricular diastolic parameters are consistent with   Grade III diastolic dysfunction (restrictive).   2. Right ventricular systolic function is normal. The right ventricular  size is normal. There is normal pulmonary artery systolic pressure.   3. The mitral valve is normal in structure. Trivial mitral valve  regurgitation. No evidence of mitral stenosis.   4. The aortic valve is normal in structure. Aortic valve regurgitation is  not visualized. No aortic stenosis is present.   5. The inferior vena cava is normal in size with greater than 50%  respiratory variability, suggesting right atrial pressure of 3 mmHg.   Comparison(s): No prior Echocardiogram.   FINDINGS   Left Ventricle: Left ventricular ejection fraction, by estimation, is 55  to 60%. The left ventricle has normal function. Left ventricular  endocardial border not optimally defined to evaluate regional wall motion.  The left ventricular internal cavity  size was normal in size. There is severe concentric  left ventricular  hypertrophy. Left ventricular diastolic parameters are consistent with  Grade III diastolic dysfunction (restrictive).   Right Ventricle: The right ventricular size is normal. No increase in  right ventricular wall thickness. Right ventricular systolic function is  normal. There is normal pulmonary artery systolic pressure. The tricuspid  regurgitant velocity is 2.05 m/s, and   with an assumed right atrial pressure of 3 mmHg, the estimated right  ventricular systolic pressure is 41.9 mmHg.   Left Atrium: Left atrial size was normal in size.   Right Atrium: Right atrial size was normal in size.   Pericardium: There is no evidence of pericardial effusion. Presence of  pericardial fat pad.   Mitral Valve: The mitral valve is normal in structure. Trivial mitral  valve regurgitation. No evidence of mitral valve stenosis.   Tricuspid Valve: The tricuspid valve is normal in structure. Tricuspid  valve regurgitation is trivial. No evidence of tricuspid  stenosis.   Aortic Valve: The aortic valve is normal in structure. Aortic valve  regurgitation is not visualized. No aortic stenosis is present. Aortic  valve mean gradient measures 3.0 mmHg. Aortic valve peak gradient measures  5.9 mmHg.   Pulmonic Valve: The pulmonic valve was normal in structure. Pulmonic valve  regurgitation is not visualized. No evidence of pulmonic stenosis.   Aorta: The ascending aorta was not well visualized and the aortic root is  normal in size and structure.   Venous: The inferior vena cava is normal in size with greater than 50%  respiratory variability, suggesting right atrial pressure of 3 mmHg.   IAS/Shunts: No atrial level shunt detected by color flow Doppler.    07/31/21 7 day heart monitor  Patch Wear Time:  7 days and 0 hours (2022-07-19T10:43:10-399 to 2022-07-26T11:34:46-0400)   Patient had a min HR of 36 bpm, max HR of 171 bpm, and avg HR of 69 bpm.    Predominant underlying rhythm was Sinus Rhythm. Bundle Branch Block/IVCD was present.    103 Supraventricular Tachycardia runs occurred, the run with the fastest interval lasting 4 beats with a max rate of 171 bpm, the longest lasting 11 beats with an avg rate of 131 bpm. Isolated SVEs were frequent (6.6%, 45837), SVE Couplets were occasional (1.3%, 4394), and SVE Triplets were rare (<1.0%, 1620).    Isolated VEs were rare (<1.0%), and no VE Couplets or VE Triplets were present.   Impression: Overall unremarkable event monitor.  Rare brief atrial runs.  Laboratory Data:  High Sensitivity Troponin:   Recent Labs  Lab 01/08/23 1619 01/09/23 0915 01/14/23 0929  TROPONINIHS '17 14 15     '$ Chemistry Recent Labs  Lab 01/10/23 1232 01/11/23 0439 01/14/23 0929  NA 137 138 138  K 3.8 3.9 3.8  CL 105 106 103  CO2 '23 25 24  '$ GLUCOSE 153* 92 189*  BUN 41* 35* 22  CREATININE 1.03* 0.96 1.28*  CALCIUM 9.1 9.0 9.0  GFRNONAA 50* 55* 39*  ANIONGAP '9 7 11    '$ Recent Labs  Lab 01/08/23 1619   PROT 7.8  ALBUMIN 3.9  AST 26  ALT 14  ALKPHOS 53  BILITOT 1.0   Lipids No results for input(s): "CHOL", "TRIG", "HDL", "LABVLDL", "LDLCALC", "CHOLHDL" in the last 168 hours.  Hematology Recent Labs  Lab 01/09/23 0510 01/11/23 0439 01/14/23 0929  WBC 5.4 6.0 7.9  RBC 4.86 4.53 5.12*  HGB 13.3 12.4 14.1  HCT 42.7 39.4 46.9*  MCV 87.9 87.0 91.6  MCH 27.4 27.4 27.5  MCHC 31.1 31.5 30.1  RDW 14.6 14.4 14.6  PLT 238 240 285   Thyroid  Recent Labs  Lab 01/14/23 0929  TSH 2.191    BNPNo results for input(s): "BNP", "PROBNP" in the last 168 hours.  DDimer No results for input(s): "DDIMER" in the last 168 hours.   Radiology/Studies:  No results found.   Assessment and Plan:   Atrial fibrillation with RVR Secondary hypercoagulable state CHA2DS2-VASc Score = 5   Patient admitted with RSV/atypical pneumonia noted to have onset of afib with RVR on 1/14. She received Metoprolol 12.'5mg'$  PO with telemetry initially showing improved rate control.   Suspect afib was precipitated by hypoxia, respiratory illness, and steroid use. Rates back up this morning following nearly 12 hour interval between metoprolol doses. Patient with lower BP this morning, will plan to continue Metoprolol 12.'5mg'$ , increase frequency to Q6H with hold parameter for systolic BP <[id number]. If rates remain elevated, would need to consider using Amiodarone. Digoxin not a good option due to age and decreased renal function. Discussed [place] with patient and family this morning, will initiate Eliquis 2.'5mg'$ .  TSH wnl. Will follow echocardiogram today.  RSV bronchiolitis with acute hypoxic respiratory failure Atypical pneumonia (per chest CT)  Patient without significant wheezing on exam today. Continue steroids per primary team. Titrate O2 down as able to room air.   Hypertension  Patient with history of hypertension though does not appear to currently take any BP reducing medications. Normal-low normal BP  today.   Per primary team:  Diabetes mellitus Fibromyalgia Restless leg syndrome  Risk Assessment/Risk Scores:          CHA2DS2-VASc Score = 5   This indicates a 7.2% annual risk of stroke. The patient's score is based upon: CHF History: 0 HTN History: 1 Diabetes History: 1 Stroke History: 0 Vascular Disease History: 0 Age Score: 2 Gender Score: 1         For questions or updates, please contact [place] Please consult [email or web address] for contact info under    Signed, Lily Kocher, PA-C  01/14/2023 11:39 AM

## 2023-01-15 DIAGNOSIS — I10 Essential (primary) hypertension: Secondary | ICD-10-CM | POA: Diagnosis not present

## 2023-01-15 DIAGNOSIS — I4819 Other persistent atrial fibrillation: Secondary | ICD-10-CM

## 2023-01-15 DIAGNOSIS — E1142 Type 2 diabetes mellitus with diabetic polyneuropathy: Secondary | ICD-10-CM | POA: Diagnosis not present

## 2023-01-15 DIAGNOSIS — J210 Acute bronchiolitis due to respiratory syncytial virus: Secondary | ICD-10-CM | POA: Diagnosis not present

## 2023-01-15 LAB — BASIC METABOLIC PANEL
Anion gap: 8 (ref 5–15)
BUN: 33 mg/dL — ABNORMAL HIGH (ref 8–23)
CO2: 25 mmol/L (ref 22–32)
Calcium: 8.7 mg/dL — ABNORMAL LOW (ref 8.9–10.3)
Chloride: 102 mmol/L (ref 98–111)
Creatinine, Ser: 1.27 mg/dL — ABNORMAL HIGH (ref 0.44–1.00)
GFR, Estimated: 39 mL/min — ABNORMAL LOW (ref >60)
Glucose, Bld: 147 mg/dL — ABNORMAL HIGH (ref 70–99)
Potassium: 4.3 mmol/L (ref 3.5–5.1)
Sodium: 135 mmol/L (ref 135–145)

## 2023-01-15 LAB — GLUCOSE, CAPILLARY
Glucose-Capillary: 114 mg/dL — ABNORMAL HIGH (ref 70–99)
Glucose-Capillary: 204 mg/dL — ABNORMAL HIGH (ref 70–99)
Glucose-Capillary: 368 mg/dL — ABNORMAL HIGH (ref 70–99)
Glucose-Capillary: 210 mg/dL — ABNORMAL HIGH (ref 70–99)

## 2023-01-15 MED ORDER — ADULT MULTIVITAMIN W/MINERALS CH: 1.0000 | ORAL_TABLET | Freq: Every day | ORAL | Status: DC

## 2023-01-15 MED ORDER — MOMETASONE FURO-FORMOTEROL FUM 200-5 MCG/ACT IN AERO: 2.0000 | INHALATION_SPRAY | Freq: Two times a day (BID) | RESPIRATORY_TRACT | 2 refills | Status: DC

## 2023-01-15 MED ORDER — APIXABAN 2.5 MG PO TABS: 2.5000 mg | ORAL_TABLET | Freq: Two times a day (BID) | ORAL | 3 refills | Status: DC

## 2023-01-15 MED ORDER — BENZONATATE 200 MG PO CAPS: 200.0000 mg | ORAL_CAPSULE | Freq: Three times a day (TID) | ORAL | 0 refills | Status: DC | PRN

## 2023-01-15 MED ORDER — METOPROLOL TARTRATE 25 MG PO TABS
12.5000 mg | ORAL_TABLET | Freq: Two times a day (BID) | ORAL | Status: DC
  Administered 2023-01-15 – 2023-01-16 (×3): 12.5 mg via ORAL
  Filled 2023-01-15 (×3): qty 1

## 2023-01-15 MED ORDER — GUAIFENESIN-DM 100-10 MG/5ML PO SYRP: 10.0000 mL | ORAL_SOLUTION | ORAL | 0 refills | Status: DC | PRN

## 2023-01-15 MED ORDER — ENSURE ENLIVE PO LIQD: 237.0000 mL | Freq: Two times a day (BID) | ORAL | 2 refills | Status: AC

## 2023-01-15 NOTE — Progress Notes (Signed)
Rounding Note    Patient Name: Dana Burns Date of Encounter: 01/15/2023  [place] Cardiologist: None   Subjective   Patient reports that she is feeling better today with improved energy and no dizziness. She denies chest pain, palpitations or feeling short of breath.  Inpatient Medications    Scheduled Meds:  apixaban  2.5 mg Oral BID   citalopram  20 mg Oral Daily   diazepam  2.5 mg Oral Daily   famotidine  40 mg Oral QHS   feeding supplement  237 mL Oral BID BM   gabapentin  800 mg Oral TID   insulin aspart  0-15 Units Subcutaneous TID WC   metoprolol tartrate  12.5 mg Oral Q6H   mometasone-formoterol  2 puff Inhalation BID   multivitamin with minerals  1 tablet Oral Daily   predniSONE  40 mg Oral QAC breakfast   rOPINIRole  1 mg Oral QHS   umeclidinium bromide  1 puff Inhalation Daily   venlafaxine XR  37.5 mg Oral Q breakfast   Continuous Infusions:  lactated ringers 75 mL/hr at 01/14/23 2053   PRN Meds: acetaminophen, benzonatate, diazepam, guaiFENesin-dextromethorphan, hydrALAZINE, levalbuterol, tiZANidine   Vital Signs    Vitals:   01/14/23 2058 01/15/23 0210 01/15/23 0417 01/15/23 0744  BP: 114/65 (!) 95/56 (!) 102/58   Pulse:  83 83   Resp: 19  (!) 22   Temp:   98.1 F (36.7 C)   TempSrc:   Oral   SpO2:   98% 98%  Weight:      Height:        Intake/Output Summary (Last 24 hours) at 01/15/2023 0802 Last data filed at 01/15/2023 0700 Gross per 24 hour  Intake 1270 ml  Output 550 ml  Net 720 ml      01/11/2023   11:38 AM 11/20/2022    3:15 PM 08/14/2022    2:55 PM  Last 3 Weights  Weight (lbs) 132 lb 140 lb 136 lb 9.6 oz  Weight (kg) 59.875 kg 63.504 kg 61.961 kg      Telemetry    Atrial fibrillation with ventricular rates 60s-80s. Isolated 2.2 second pause noted - Personally Reviewed  ECG    No new tracing  Physical Exam   GEN: No acute distress.   Neck: No JVD Cardiac: irregularly irregular, no murmurs, rubs, or  gallops.  Respiratory: Clear to auscultation bilaterally. GI: Soft, nontender, non-distended  MS: Trace left lower extremity edema; No deformity. Neuro:  Nonfocal  Psych: Normal affect   Labs    High Sensitivity Troponin:   Recent Labs  Lab 01/08/23 1619 01/09/23 0915 01/14/23 0929 01/14/23 1042  TROPONINIHS '17 14 15 15     '$ Chemistry Recent Labs  Lab 01/08/23 1619 01/09/23 0510 01/11/23 0439 01/14/23 0929 01/15/23 0506  NA 138   < > 138 138 135  K 4.3   < > 3.9 3.8 4.3  CL 103   < > 106 103 102  CO2 22   < > '25 24 25  '$ GLUCOSE 124*   < > 92 189* 147*  BUN 30*   < > 35* 22 33*  CREATININE 1.16*   < > 0.96 1.28* 1.27*  CALCIUM 9.5   < > 9.0 9.0 8.7*  PROT 7.8  --   --   --   --   ALBUMIN 3.9  --   --   --   --   AST 26  --   --   --   --  ALT 14  --   --   --   --   ALKPHOS 53  --   --   --   --   BILITOT 1.0  --   --   --   --   GFRNONAA 44*   < > 55* 39* 39*  ANIONGAP 13   < > '7 11 8   '$ < > = values in this interval not displayed.    Lipids No results for input(s): "CHOL", "TRIG", "HDL", "LABVLDL", "LDLCALC", "CHOLHDL" in the last 168 hours.  Hematology Recent Labs  Lab 01/09/23 0510 01/11/23 0439 01/14/23 0929  WBC 5.4 6.0 7.9  RBC 4.86 4.53 5.12*  HGB 13.3 12.4 14.1  HCT 42.7 39.4 46.9*  MCV 87.9 87.0 91.6  MCH 27.4 27.4 27.5  MCHC 31.1 31.5 30.1  RDW 14.6 14.4 14.6  PLT 238 240 285   Thyroid  Recent Labs  Lab 01/14/23 0929  TSH 2.191    BNPNo results for input(s): "BNP", "PROBNP" in the last 168 hours.  DDimer No results for input(s): "DDIMER" in the last 168 hours.   Radiology    ECHOCARDIOGRAM COMPLETE  Result Date: 01/14/2023    ECHOCARDIOGRAM REPORT   Patient Name:   LAYIA WALLA Date of Exam: 01/14/2023 Medical Rec #:  [id number]     Height:       66.0 in Accession #:    [id number]    Weight:       132.0 lb Date of Birth:  08-Mar-1926     BSA:          1.676 m Patient Age:    37 years      BP:           107/46 mmHg Patient Gender: F              HR:           113 bpm. Exam Location:  Inpatient Procedure: 2D Echo Indications:    atrial fibrillation  History:        Patient has prior history of Echocardiogram examinations, most                 recent 08/03/2021. Arrythmias:Atrial Fibrillation and Tachycardia;                 Risk Factors:Diabetes and Hypertension.  Sonographer:    Harvie Junior Referring Phys: Theola Sequin  Sonographer Comments: Technically difficult study due to poor echo windows and suboptimal subcostal window. IMPRESSIONS  1. Left ventricular ejection fraction, by estimation, is 60 to 65%. The left ventricle has normal function. The left ventricle has no regional wall motion abnormalities. Left ventricular diastolic parameters are indeterminate.  2. Right ventricular systolic function is normal. The right ventricular size is normal. There is normal pulmonary artery systolic pressure.  3. Left atrial size was mildly dilated.  4. Right atrial size was mildly dilated.  5. The mitral valve is normal in structure. No evidence of mitral valve regurgitation. No evidence of mitral stenosis.  6. The aortic valve is tricuspid. There is mild calcification of the aortic valve. There is mild thickening of the aortic valve. Aortic valve regurgitation is trivial. Aortic valve sclerosis is present, with no evidence of aortic valve stenosis.  7. The inferior vena cava is normal in size with greater than 50% respiratory variability, suggesting right atrial pressure of 3 mmHg. FINDINGS  Left Ventricle: Left ventricular ejection fraction, by estimation, is 60 to 65%. The left  ventricle has normal function. The left ventricle has no regional wall motion abnormalities. The left ventricular internal cavity size was normal in size. There is  no left ventricular hypertrophy. Left ventricular diastolic parameters are indeterminate. Right Ventricle: The right ventricular size is normal. No increase in right ventricular wall thickness. Right ventricular  systolic function is normal. There is normal pulmonary artery systolic pressure. The tricuspid regurgitant velocity is 2.54 m/s, and  with an assumed right atrial pressure of 3 mmHg, the estimated right ventricular systolic pressure is 77.8 mmHg. Left Atrium: Left atrial size was mildly dilated. Right Atrium: Right atrial size was mildly dilated. Pericardium: There is no evidence of pericardial effusion. Mitral Valve: The mitral valve is normal in structure. No evidence of mitral valve regurgitation. No evidence of mitral valve stenosis. Tricuspid Valve: The tricuspid valve is normal in structure. Tricuspid valve regurgitation is mild . No evidence of tricuspid stenosis. Aortic Valve: The aortic valve is tricuspid. There is mild calcification of the aortic valve. There is mild thickening of the aortic valve. Aortic valve regurgitation is trivial. Aortic regurgitation PHT measures 401 msec. Aortic valve sclerosis is present, with no evidence of aortic valve stenosis. Aortic valve mean gradient measures 4.7 mmHg. Aortic valve peak gradient measures 8.3 mmHg. Aortic valve area, by VTI measures 2.46 cm. Pulmonic Valve: The pulmonic valve was normal in structure. Pulmonic valve regurgitation is not visualized. No evidence of pulmonic stenosis. Aorta: The aortic root is normal in size and structure. Venous: The inferior vena cava is normal in size with greater than 50% respiratory variability, suggesting right atrial pressure of 3 mmHg. IAS/Shunts: No atrial level shunt detected by color flow Doppler.  LEFT VENTRICLE PLAX 2D LVIDd:         3.70 cm     Diastology LVIDs:         2.50 cm     LV e' medial:    5.91 cm/s LV PW:         1.00 cm     LV E/e' medial:  14.1 LV IVS:        1.00 cm     LV e' lateral:   6.85 cm/s LVOT diam:     1.80 cm     LV E/e' lateral: 12.2 LV SV:         46 LV SV Index:   27 LVOT Area:     2.54 cm  LV Volumes (MOD) LV vol d, MOD A2C: 51.3 ml LV vol d, MOD A4C: 48.9 ml LV vol s, MOD A2C: 21.6 ml  LV vol s, MOD A4C: 23.6 ml LV SV MOD A2C:     29.7 ml LV SV MOD A4C:     48.9 ml LV SV MOD BP:      27.6 ml RIGHT VENTRICLE RV Basal diam:  3.40 cm RV Mid diam:    3.00 cm RV S prime:     13.97 cm/s TAPSE (M-mode): 1.2 cm LEFT ATRIUM           Index        RIGHT ATRIUM           Index LA diam:      2.90 cm 1.73 cm/m   RA Area:     13.20 cm LA Vol (A4C): 41.5 ml 24.76 ml/m  RA Volume:   31.90 ml  19.03 ml/m  AORTIC VALVE  PULMONIC VALVE AV Area (Vmax):    2.24 cm     PV Vmax:       1.05 m/s AV Area (Vmean):   2.14 cm     PV Peak grad:  4.4 mmHg AV Area (VTI):     2.46 cm AV Vmax:           144.00 cm/s AV Vmean:          97.033 cm/s AV VTI:            0.185 m AV Peak Grad:      8.3 mmHg AV Mean Grad:      4.7 mmHg LVOT Vmax:         127.00 cm/s LVOT Vmean:        81.633 cm/s LVOT VTI:          0.179 m LVOT/AV VTI ratio: 0.97 AI PHT:            401 msec  AORTA Ao Root diam: 3.10 cm Ao Asc diam:  2.70 cm MITRAL VALVE               TRICUSPID VALVE MV Area (PHT): 3.88 cm    TR Peak grad:   25.8 mmHg MV Decel Time: 196 msec    TR Vmax:        254.00 cm/s MR Peak grad: 28.5 mmHg MR Vmax:      267.00 cm/s  SHUNTS MV E velocity: 83.43 cm/s  Systemic VTI:  0.18 m                            Systemic Diam: 1.80 cm Jenkins Rouge MD Electronically signed by Jenkins Rouge MD Signature Date/Time: 01/14/2023/1:42:21 PM    Final     Cardiac Studies   08/03/21 TTE   IMPRESSIONS     1. Left ventricular ejection fraction, by estimation, is 55 to 60%. The  left ventricle has normal function. Left ventricular endocardial border  not optimally defined to evaluate regional wall motion. There is severe  concentric left ventricular  hypertrophy. Left ventricular diastolic parameters are consistent with  Grade III diastolic dysfunction (restrictive).   2. Right ventricular systolic function is normal. The right ventricular  size is normal. There is normal pulmonary artery systolic pressure.   3. The  mitral valve is normal in structure. Trivial mitral valve  regurgitation. No evidence of mitral stenosis.   4. The aortic valve is normal in structure. Aortic valve regurgitation is  not visualized. No aortic stenosis is present.   5. The inferior vena cava is normal in size with greater than 50%  respiratory variability, suggesting right atrial pressure of 3 mmHg.   Comparison(s): No prior Echocardiogram.   FINDINGS   Left Ventricle: Left ventricular ejection fraction, by estimation, is 55  to 60%. The left ventricle has normal function. Left ventricular  endocardial border not optimally defined to evaluate regional wall motion.  The left ventricular internal cavity  size was normal in size. There is severe concentric left ventricular  hypertrophy. Left ventricular diastolic parameters are consistent with  Grade III diastolic dysfunction (restrictive).   Right Ventricle: The right ventricular size is normal. No increase in  right ventricular wall thickness. Right ventricular systolic function is  normal. There is normal pulmonary artery systolic pressure. The tricuspid  regurgitant velocity is 2.05 m/s, and   with an assumed right atrial pressure of 3 mmHg, the estimated right  ventricular systolic pressure is 10.2 mmHg.   Left Atrium: Left atrial size was normal in size.   Right Atrium: Right atrial size was normal in size.   Pericardium: There is no evidence of pericardial effusion. Presence of  pericardial fat pad.   Mitral Valve: The mitral valve is normal in structure. Trivial mitral  valve regurgitation. No evidence of mitral valve stenosis.   Tricuspid Valve: The tricuspid valve is normal in structure. Tricuspid  valve regurgitation is trivial. No evidence of tricuspid stenosis.   Aortic Valve: The aortic valve is normal in structure. Aortic valve  regurgitation is not visualized. No aortic stenosis is present. Aortic  valve mean gradient measures 3.0 mmHg. Aortic  valve peak gradient measures  5.9 mmHg.   Pulmonic Valve: The pulmonic valve was normal in structure. Pulmonic valve  regurgitation is not visualized. No evidence of pulmonic stenosis.   Aorta: The ascending aorta was not well visualized and the aortic root is  normal in size and structure.   Venous: The inferior vena cava is normal in size with greater than 50%  respiratory variability, suggesting right atrial pressure of 3 mmHg.   IAS/Shunts: No atrial level shunt detected by color flow Doppler.      07/31/21 7 day heart monitor   Patch Wear Time:  7 days and 0 hours (2022-07-19T10:43:10-399 to 2022-07-26T11:34:46-0400)   Patient had a min HR of 36 bpm, max HR of 171 bpm, and avg HR of 69 bpm.    Predominant underlying rhythm was Sinus Rhythm. Bundle Branch Block/IVCD was present.    103 Supraventricular Tachycardia runs occurred, the run with the fastest interval lasting 4 beats with a max rate of 171 bpm, the longest lasting 11 beats with an avg rate of 131 bpm. Isolated SVEs were frequent (6.6%, 45837), SVE Couplets were occasional (1.3%, 4394), and SVE Triplets were rare (<1.0%, 1620).    Isolated VEs were rare (<1.0%), and no VE Couplets or VE Triplets were present.   Impression: Overall unremarkable event monitor.  Rare brief atrial runs.  Patient Profile      HEAVAN FRANCOM is a 89 y.o. female with a hx of DM type II, hypertension, colon cancer, mixed dyslipidemia, fibromyalgia who is being seen for the evaluation of atrial fibrillation at the request of Dr. Karleen Hampshire.   Assessment & Plan    Atrial fibrillation with RVR Secondary hypercoagulable state CHA2DS2-VASc Score = 5    Patient admitted with RSV/atypical pneumonia noted to have onset of afib with RVR on 1/14. TTE on 1/15 with LVEF 60-65%. Mild left and right atrial dilation.    Suspect afib was precipitated by hypoxia, respiratory illness, and steroid use. Patient has continued to have borderline low BP and  Metoprolol has been held since early afternoon 1/15. Despite this, rates have remained relatively well controlled. If rates were to become elevated again, would need to consider using Amiodarone. Digoxin not a good option due to age and decreased renal function. Otherwise could consider discharging with PRN Metoprolol.  Discussed [place] with patient and family. Continue Eliquis 2.'5mg'$  BID. TSH wnl. Will arrange outpatient cardiology follow up. Continue to encourage good oral intake    RSV bronchiolitis with acute hypoxic respiratory failure Atypical pneumonia (per chest CT)   Patient without significant wheezing on exam today. Continue steroids per primary team. Titrate O2 down as able to room air.    Hypertension   Patient with history of hypertension though does not appear to currently take any BP  reducing medications. Normal-low normal BP today.     Per primary team:   Diabetes mellitus Fibromyalgia Restless leg syndrome     For questions or updates, please contact [facility] Please consult [email or web address] for contact info under        Signed, Lily Kocher, PA-C  01/15/2023, 8:02 AM

## 2023-01-15 NOTE — Discharge Instructions (Signed)

## 2023-01-15 NOTE — Progress Notes (Signed)
Mobility Specialist - Progress Note  Pre-mobility: 120 bpm HR,  During mobility: 186 bpm HR,  Post-mobility: 136 bpm HR,    01/15/23 1348  Mobility  Activity Ambulated with assistance to bathroom  Level of Assistance Minimal assist, patient does 75% or more  Assistive Device Front wheel walker  Distance Ambulated (ft) 16 ft  Range of Motion/Exercises Active  Activity Response Tolerated well  Mobility Referral Yes  $Mobility charge 1 Mobility   Pt was found on recliner chair and agreeable to ambulate in room. Stated needing to use the bathroom and stated having pain on her RUQ abdomin (by pointing). HR elevated to 186bpm when ambulating to bathroom and dropped back down once sitting on the toilet to 130s. At EOS was left on bed with all necessities in reach. RN notified of session.  Ferd Hibbs Mobility Specialist

## 2023-01-15 NOTE — Progress Notes (Signed)
[facility]                                                                               [name], is a 89 y.o. female, DOB - 02-06-1926, EHU:[id number] Admit date - 01/08/2023    Outpatient Primary MD for the patient is Mosie Lukes, MD  LOS - 5  days    Brief summary   Dana Burns is a 89 y.o. female with medical history significant of DM2, HTN.   Pt presents to ED with 7 day h/o nonproductive cough, fever, chills, anorexia, worsening generalized weakness.  She was admitted for RSV bronchiolitis.     Assessment & Plan    Assessment and Plan: * RSV bronchiolitis/ acute respiratory failure with hypoxia requiring about 2 to 3 lit of [place] oxygen.  Completed the course of antibiotics and steroids.  Check ambulating oxygen levels today.  Therapy evals recommending SNF.    Type 2 diabetes mellitus with peripheral neuropathy (HCC)  CBG (last 3)  Recent Labs    01/14/23 2009 01/15/23 0727 01/15/23 1118  GLUCAP 226* 114* 204*   Continue with SSI.  A1c is 6.8%   Essential hypertension, benign BP parameters are well controlled.   New onset of atrial fibrillation with RVR:  Rate better. Around 90's. Unclear etiology. Probably brought on by her respiratory illness.  TSH wnl. ECHO unremarkable.  Cardiology consulted and she was started on metoprolol for rate control and on low dose eliquis for anti coagulation.     Mild AKI.  Suspect from poor oral intake and dehydration.  Started on IV fluids.  Check renal parameters in am.   Interventions: Ensure Enlive (each supplement provides 350kcal and 20 grams of protein), MVI  Estimated body mass index is 21.31 kg/m as calculated from the following:   Height as of this encounter: '5\' 6"'$  (1.676 m).   Weight as of this encounter: 59.9 kg.  Code Status: DNR  DVT Prophylaxis:  apixaban (ELIQUIS) tablet 2.5 mg Start: 01/14/23 1400 apixaban (ELIQUIS) tablet 2.5 mg   Level of Care: Level of care:  Telemetry Family Communication: none at bedside.   Disposition Plan:     Remains inpatient appropriate:  possible dc in am once renal parameters improve.   Procedures:  ECHO.   Consultants:   Cardiology.   Antimicrobials:   Anti-infectives (From admission, onward)    Start     Dose/Rate Route Frequency Ordered Stop   01/09/23 1045  azithromycin (ZITHROMAX) tablet 500 mg        500 mg Oral Daily 01/09/23 0958 01/13/23 1000        Medications  Scheduled Meds:  apixaban  2.5 mg Oral BID   citalopram  20 mg Oral Daily   diazepam  2.5 mg Oral Daily   famotidine  40 mg Oral QHS   feeding supplement  237 mL Oral BID BM   gabapentin  800 mg Oral TID   insulin aspart  0-15 Units Subcutaneous TID WC   metoprolol tartrate  12.5 mg Oral BID   mometasone-formoterol  2 puff Inhalation BID   multivitamin with minerals  1 tablet Oral Daily   rOPINIRole  1 mg Oral QHS   umeclidinium bromide  1 puff Inhalation Daily   venlafaxine XR  37.5 mg Oral Q breakfast   Continuous Infusions:  lactated ringers 75 mL/hr at 01/14/23 2053    PRN Meds:.acetaminophen, benzonatate, diazepam, guaiFENesin-dextromethorphan, hydrALAZINE, levalbuterol, tiZANidine    Subjective:   Shreshta Medley was seen and examined today.   Does not want to go home today. . No sob or chest pain . No nausea, vomiting.    Objective:   Vitals:   01/15/23 0210 01/15/23 0417 01/15/23 0744 01/15/23 1242  BP: (!) 95/56 (!) 102/58  100/77  Pulse: 83 83  (!) 122  Resp:  (!) 22  20  Temp:  98.1 F (36.7 C)  98.6 F (37 C)  TempSrc:  Oral    SpO2:  98% 98%   Weight:      Height:        Intake/Output Summary (Last 24 hours) at 01/15/2023 1326 Last data filed at 01/15/2023 0900 Gross per 24 hour  Intake 920 ml  Output 550 ml  Net 370 ml    Filed Weights   01/11/23 1138  Weight: 59.9 kg     Exam General exam: elderly woman, not in distress on 2lit of [place] oxygen.  Respiratory system: Clear to auscultation.  Respiratory effort normal. Cardiovascular system: S1 & S2 heard, irregularly irregular, no Jvd,  no pedal edema.  Gastrointestinal system: Abdomen is nondistended, soft and nontender. Central nervous system: Alert and oriented. No focal neurological deficits. Extremities: Symmetric 5 x 5 power. Skin: No rashes,  Psychiatry:  Mood & affect appropriate.         Data Reviewed:  I have personally reviewed following labs and imaging studies   CBC Lab Results  Component Value Date   WBC 7.9 01/14/2023   RBC 5.12 (H) 01/14/2023   HGB 14.1 01/14/2023   HCT 46.9 (H) 01/14/2023   MCV 91.6 01/14/2023   MCH 27.5 01/14/2023   PLT 285 01/14/2023   MCHC 30.1 01/14/2023   RDW 14.6 01/14/2023   LYMPHSABS 1.8 01/14/2023   MONOABS 0.7 01/14/2023   EOSABS 0.0 01/14/2023   BASOSABS 0.0 03/47/4259     Last metabolic panel Lab Results  Component Value Date   NA 135 01/15/2023   K 4.3 01/15/2023   CL 102 01/15/2023   CO2 25 01/15/2023   BUN 33 (H) 01/15/2023   CREATININE 1.27 (H) 01/15/2023   GLUCOSE 147 (H) 01/15/2023   GFRNONAA 39 (L) 01/15/2023   GFRAA 37 (L) 11/13/2016   CALCIUM 8.7 (L) 01/15/2023   PHOS 3.4 09/07/2014   PROT 7.8 01/08/2023   ALBUMIN 3.9 01/08/2023   LABGLOB 2.2 06/21/2014   AGRATIO 2.0 06/21/2014   BILITOT 1.0 01/08/2023   ALKPHOS 53 01/08/2023   AST 26 01/08/2023   ALT 14 01/08/2023   ANIONGAP 8 01/15/2023    CBG (last 3)  Recent Labs    01/14/23 2009 01/15/23 0727 01/15/23 1118  GLUCAP 226* 114* 204*       Coagulation Profile: No results for input(s): "INR", "PROTIME" in the last 168 hours.   Radiology Studies: ECHOCARDIOGRAM COMPLETE  Result Date: 01/14/2023    ECHOCARDIOGRAM REPORT   Patient Name:   Dana Burns Date of Exam: 01/14/2023 Medical Rec #:  [id number]     Height:       66.0 in Accession #:    [id number]    Weight:       132.0 lb Date of Birth:  [birth year]     BSA:          1.676 m Patient Age:    5 years      BP:            107/46 mmHg Patient Gender: F             HR:           113 bpm. Exam Location:  Inpatient Procedure: 2D Echo Indications:    atrial fibrillation  History:        Patient has prior history of Echocardiogram examinations, most                 recent 08/03/2021. Arrythmias:Atrial Fibrillation and Tachycardia;                 Risk Factors:Diabetes and Hypertension.  Sonographer:    Harvie Junior Referring Phys: Theola Sequin  Sonographer Comments: Technically difficult study due to poor echo windows and suboptimal subcostal window. IMPRESSIONS  1. Left ventricular ejection fraction, by estimation, is 60 to 65%. The left ventricle has normal function. The left ventricle has no regional wall motion abnormalities. Left ventricular diastolic parameters are indeterminate.  2. Right ventricular systolic function is normal. The right ventricular size is normal. There is normal pulmonary artery systolic pressure.  3. Left atrial size was mildly dilated.  4. Right atrial size was mildly dilated.  5. The mitral valve is normal in structure. No evidence of mitral valve regurgitation. No evidence of mitral stenosis.  6. The aortic valve is tricuspid. There is mild calcification of the aortic valve. There is mild thickening of the aortic valve. Aortic valve regurgitation is trivial. Aortic valve sclerosis is present, with no evidence of aortic valve stenosis.  7. The inferior vena cava is normal in size with greater than 50% respiratory variability, suggesting right atrial pressure of 3 mmHg. FINDINGS  Left Ventricle: Left ventricular ejection fraction, by estimation, is 60 to 65%. The left ventricle has normal function. The left ventricle has no regional wall motion abnormalities. The left ventricular internal cavity size was normal in size. There is  no left ventricular hypertrophy. Left ventricular diastolic parameters are indeterminate. Right Ventricle: The right ventricular size is normal. No increase in right ventricular  wall thickness. Right ventricular systolic function is normal. There is normal pulmonary artery systolic pressure. The tricuspid regurgitant velocity is 2.54 m/s, and  with an assumed right atrial pressure of 3 mmHg, the estimated right ventricular systolic pressure is 19.1 mmHg. Left Atrium: Left atrial size was mildly dilated. Right Atrium: Right atrial size was mildly dilated. Pericardium: There is no evidence of pericardial effusion. Mitral Valve: The mitral valve is normal in structure. No evidence of mitral valve regurgitation. No evidence of mitral valve stenosis. Tricuspid Valve: The tricuspid valve is normal in structure. Tricuspid valve regurgitation is mild . No evidence of tricuspid stenosis. Aortic Valve: The aortic valve is tricuspid. There is mild calcification of the aortic valve. There is mild thickening of the aortic valve. Aortic valve regurgitation is trivial. Aortic regurgitation PHT measures 401 msec. Aortic valve sclerosis is present, with no evidence of aortic valve stenosis. Aortic valve mean gradient measures 4.7 mmHg. Aortic valve peak gradient measures 8.3 mmHg. Aortic valve area, by VTI measures 2.46 cm. Pulmonic Valve: The pulmonic valve was normal in structure. Pulmonic valve regurgitation is not visualized. No evidence of pulmonic stenosis. Aorta: The aortic root is normal in size and structure. Venous: The inferior vena cava  is normal in size with greater than 50% respiratory variability, suggesting right atrial pressure of 3 mmHg. IAS/Shunts: No atrial level shunt detected by color flow Doppler.  LEFT VENTRICLE PLAX 2D LVIDd:         3.70 cm     Diastology LVIDs:         2.50 cm     LV e' medial:    5.91 cm/s LV PW:         1.00 cm     LV E/e' medial:  14.1 LV IVS:        1.00 cm     LV e' lateral:   6.85 cm/s LVOT diam:     1.80 cm     LV E/e' lateral: 12.2 LV SV:         46 LV SV Index:   27 LVOT Area:     2.54 cm  LV Volumes (MOD) LV vol d, MOD A2C: 51.3 ml LV vol d, MOD A4C:  48.9 ml LV vol s, MOD A2C: 21.6 ml LV vol s, MOD A4C: 23.6 ml LV SV MOD A2C:     29.7 ml LV SV MOD A4C:     48.9 ml LV SV MOD BP:      27.6 ml RIGHT VENTRICLE RV Basal diam:  3.40 cm RV Mid diam:    3.00 cm RV S prime:     13.97 cm/s TAPSE (M-mode): 1.2 cm LEFT ATRIUM           Index        RIGHT ATRIUM           Index LA diam:      2.90 cm 1.73 cm/m   RA Area:     13.20 cm LA Vol (A4C): 41.5 ml 24.76 ml/m  RA Volume:   31.90 ml  19.03 ml/m  AORTIC VALVE                    PULMONIC VALVE AV Area (Vmax):    2.24 cm     PV Vmax:       1.05 m/s AV Area (Vmean):   2.14 cm     PV Peak grad:  4.4 mmHg AV Area (VTI):     2.46 cm AV Vmax:           144.00 cm/s AV Vmean:          97.033 cm/s AV VTI:            0.185 m AV Peak Grad:      8.3 mmHg AV Mean Grad:      4.7 mmHg LVOT Vmax:         127.00 cm/s LVOT Vmean:        81.633 cm/s LVOT VTI:          0.179 m LVOT/AV VTI ratio: 0.97 AI PHT:            401 msec  AORTA Ao Root diam: 3.10 cm Ao Asc diam:  2.70 cm MITRAL VALVE               TRICUSPID VALVE MV Area (PHT): 3.88 cm    TR Peak grad:   25.8 mmHg MV Decel Time: 196 msec    TR Vmax:        254.00 cm/s MR Peak grad: 28.5 mmHg MR Vmax:      267.00 cm/s  SHUNTS MV E velocity: 83.43 cm/s  Systemic VTI:  0.18 m  Systemic Diam: 1.80 cm Jenkins Rouge MD Electronically signed by Jenkins Rouge MD Signature Date/Time: 01/14/2023/1:42:21 PM    Final        Hosie Poisson M.D. [facility] 01/15/2023, 1:26 PM  Available via Epic secure chat 7am-7pm After 7 pm, please refer to night coverage provider listed on amion.

## 2023-01-16 DIAGNOSIS — J210 Acute bronchiolitis due to respiratory syncytial virus: Secondary | ICD-10-CM | POA: Diagnosis not present

## 2023-01-16 LAB — BASIC METABOLIC PANEL
Anion gap: 6 (ref 5–15)
BUN: 28 mg/dL — ABNORMAL HIGH (ref 8–23)
CO2: 28 mmol/L (ref 22–32)
Calcium: 8.9 mg/dL (ref 8.9–10.3)
Chloride: 104 mmol/L (ref 98–111)
Creatinine, Ser: 1.06 mg/dL — ABNORMAL HIGH (ref 0.44–1.00)
GFR, Estimated: 49 mL/min — ABNORMAL LOW (ref >60)
Glucose, Bld: 107 mg/dL — ABNORMAL HIGH (ref 70–99)
Potassium: 4.2 mmol/L (ref 3.5–5.1)
Sodium: 138 mmol/L (ref 135–145)

## 2023-01-16 LAB — GLUCOSE, CAPILLARY
Glucose-Capillary: 88 mg/dL (ref 70–99)
Glucose-Capillary: 163 mg/dL — ABNORMAL HIGH (ref 70–99)
Glucose-Capillary: 91 mg/dL (ref 70–99)
Glucose-Capillary: 146 mg/dL — ABNORMAL HIGH (ref 70–99)

## 2023-01-16 MED ORDER — METOPROLOL TARTRATE 25 MG PO TABS: 25.0000 mg | ORAL_TABLET | Freq: Two times a day (BID) | ORAL | Status: DC

## 2023-01-16 MED ORDER — METOPROLOL TARTRATE 25 MG PO TABS
12.5000 mg | ORAL_TABLET | Freq: Three times a day (TID) | ORAL | Status: DC
  Administered 2023-01-16 – 2023-01-17 (×3): 12.5 mg via ORAL
  Filled 2023-01-16 (×3): qty 1

## 2023-01-16 NOTE — Progress Notes (Signed)
Physical Therapy Treatment Patient Details Name: Dana Burns MRN: [id number] DOB: 1926/07/08 Today's Date: 01/16/2023  Clinical Impression   Pt presents with reports of generalized weakness and general anxiety. Pt required min assist for transfers using RW and was able to stand for standing BP but then requested seated rest break. Pt required +2 for ambulation for safety only, no physical assist, pt reporting dizziness, distance limited by fatigue and dizziness, HR max 154 during ambulation. Pt exhibits shakiness/tremors R>L with mobility especially sit to stand transfer. Pt is highly unsteady with ambulation and is a high fall risk. Discussed possibility of SNF-level therapies at discharge and daughter is adamant she is taking the pt home with her, but both pt and daughter reporting they are not ready to discharge today. Pt would benefit from [facility] for use at home to reduce fall-risk. We will continue to follow acutely.   Vitals Sitting: BP 116/71, HR range 99-117 Standing: BP 104/54, HR 120-154      01/16/23 1414  PT Visit Information  Last PT Received On 01/16/23  Assistance Needed +1  History of Present Illness 89 y.o. female with medical history significant of DM2, HTN, colon cancer, fibromyalgia, chronic pain.  Pt presents to ED with 7 day h/o nonproductive cough, fever, chills, anorexia, worsening generalized weakness. She reported a few episodes of vomiting on Thurs, occasional diarrhea. Dx: RSV bronchitis  Subjective Data  Subjective I just feel so weak  Patient Stated Goal to get strong enough to go home  Precautions  Precautions Fall  Precaution Comments monitor HR  Restrictions  Weight Bearing Restrictions No  Pain Assessment  Pain Assessment Faces  Faces Pain Scale 4  Pain Location RT LUE - chronic  Pain Descriptors / Indicators Grimacing;Guarding;Tender  Pain Intervention(s) Limited activity within patient's tolerance;Monitored during session;Repositioned  Cognition   Arousal/Alertness Awake/alert  Behavior During Therapy WFL for tasks assessed/performed  Overall Cognitive Status Within Functional Limits for tasks assessed  Bed Mobility  General bed mobility comments Pt OOB in recliner at entry  Transfers  Overall transfer level Needs assistance  Equipment used Rolling walker (2 wheels)  Transfers Sit to/from Stand  Sit to Stand Min assist  General transfer comment Min assist for standing and in room ambulation with walker. Verbal cues for hand placement. Patient dizzy throughout treatment - increased dizziness with standing/mobility  Ambulation/Gait  Ambulation/Gait assistance Min assist  Gait Distance (Feet) 20 Feet  Assistive device Rolling walker (2 wheels)  Gait Pattern/deviations Step-through pattern;Decreased stride length;Trunk flexed  General Gait Details Min assist for balance and IV pole management pt dizzy throughout  Gait velocity WFL  Balance  Overall balance assessment Needs assistance  Sitting-balance support No upper extremity supported;Feet supported  Sitting balance-Leahy Scale Fair  Standing balance support During functional activity;Reliant on assistive device for balance  Standing balance-Leahy Scale Poor  General Comments  General comments (skin integrity, edema, etc.) Daughter present  PT - End of Session  Equipment Utilized During Treatment Gait belt  Activity Tolerance Patient limited by fatigue;Patient limited by pain  Patient left with call bell/phone within reach;with family/visitor present;in chair;with chair alarm set  Nurse Communication Mobility status   PT - Assessment/Plan  PT Plan Current plan remains appropriate  PT Visit Diagnosis Difficulty in walking, not elsewhere classified (R26.2);Pain  Pain - Right/Left Right  Pain - part of body Knee;Hip;Leg;Ankle and joints of foot  PT Frequency (ACUTE ONLY) Min 3X/week  Follow Up Recommendations Home health PT  Assistance recommended at discharge Intermittent  Supervision/Assistance  Patient can return home with the following A little help with walking and/or transfers;A little help with bathing/dressing/bathroom;Assistance with cooking/housework;Assist for transportation;Help with stairs or ramp for entrance  PT equipment [id number]  AM-PAC PT "6 Clicks" Mobility Outcome Measure (Version 2)  Help needed turning from your back to your side while in a flat bed without using bedrails? 4  Help needed moving from lying on your back to sitting on the side of a flat bed without using bedrails? 3  Help needed moving to and from a bed to a chair (including a wheelchair)? 3  Help needed standing up from a chair using your arms (e.g., wheelchair or bedside chair)? 3  Help needed to walk in hospital room? 3  Help needed climbing 3-5 steps with a railing?  3  6 Click Score 19  Consider Recommendation of Discharge To: Home with [facility]  Progressive Mobility  What is the highest level of mobility based on the progressive mobility assessment? Level 4 (Walks with assist in room) - Balance while marching in place and cannot step forward and back - Complete  Activity Ambulated with assistance in room  PT Goal Progression  Progress towards PT goals Progressing toward goals  Acute Rehab PT Goals  PT Goal Formulation With patient  Time For Goal Achievement 01/23/23  Potential to Achieve Goals Good  PT Time Calculation  PT Start Time (ACUTE ONLY) 1105  PT Stop Time (ACUTE ONLY) 1125  PT Time Calculation (min) (ACUTE ONLY) 20 min  PT General Charges  $$ ACUTE PT VISIT 1 Visit  PT Treatments  $Gait Training 8-22 mins   Coolidge Breeze, PT, DPT [facility] Rehabilitation Department Office: [phone] Weekend pager: [phone]

## 2023-01-16 NOTE — Progress Notes (Signed)
PROGRESS NOTE   Dana Burns  UGQ:[id number] DOB: November 29, 1926 DOA: 01/08/2023 PCP: Mosie Lukes, MD  Brief Narrative:  89 year old community dwelling white female Known DM TY 2 HTN HFpEF EF 55% grade 2 DD previously followed by Dr. [name] Admit from ED 1/10 status post 7 days nonproductive cough fever chills Found to have RSV with bronchiolitis and acute respiratory failure requiring oxygen 1/12-flipped into A-fib with RVR--Cardiology consulted 1/14   Hospital-Problem based course  Acute hypoxic respiratory failure 2/2 RSV bronchiolitis -Not requiring oxygen at this send - Supportive management and care and seems to be much improved  A-fib RVR new this admission -Some "dizziness" earlier today, orthostatics not performed - Will increase metoprolol to 12.5 3 times daily, started on Eliquis 2.5 twice daily this admit - Will need outpatient follow-up in about 4 weeks  AKI on admit - Resolving-would continue fluids IV 75 cc/H - Repeat labs a.m.  HTN -Moderate leak control check trends and adjust meds as needed  DM TY 2 complicated by neuropathy -Continue gabapentin 800 3 times daily, this is a high dose and may be the cause for dizziness - Monitor sugars does not seem to be on any insulin at home -As sugars are below 200 predominantly would discontinue coverage  Moderate malnutrition Depression -Continue Celexa 20, diazepam 1 mg every 12 as needed 2.5 daily and Effexor 37.5   DVT prophylaxis: Eliquis Code Status: Full Family Communication: Daughter in the room and grandson on phone Disposition:  Status is: Inpatient Remains inpatient appropriate because:   Need to workup dizziness and monitor trends    Subjective: Coherent no distress looks comfortable No fever no pain Was dizzy earlier today this seems to be subacute No chest pain Nursing reports heart rate is in the 150s with ambulation and sustaining  Objective: Vitals:   01/15/23 1812 01/15/23 2055  01/16/23 0228 01/16/23 0604  BP:  118/81 95/78 120/70  Pulse:  93 77 93  Resp:  '17 15 16  '$ Temp:  97.9 F (36.6 C) 97.6 F (36.4 C) 98.4 F (36.9 C)  TempSrc:  Oral Oral Oral  SpO2: 96% 98% 100% 98%  Weight:      Height:        Intake/Output Summary (Last 24 hours) at 01/16/2023 0658 Last data filed at 01/16/2023 0500 Gross per 24 hour  Intake 240 ml  Output 1000 ml  Net -760 ml   Filed Weights   01/11/23 1138  Weight: 59.9 kg    Examination:  EOMI NCAT no focal deficit no icterus no pallor no rales rhonchi wheeze ROM is intact S1-S2 no murmur she does seem to have some PVCs and tachycardia Abdomen is soft no rebound No Lower extremity edema Psych is euthymic  Data Reviewed: personally reviewed   CBC    Component Value Date/Time   WBC 7.9 01/14/2023 0929   RBC 5.12 (H) 01/14/2023 0929   HGB 14.1 01/14/2023 0929   HCT 46.9 (H) 01/14/2023 0929   PLT 285 01/14/2023 0929   MCV 91.6 01/14/2023 0929   MCV 80.1 06/21/2014 1457   MCH 27.5 01/14/2023 0929   MCHC 30.1 01/14/2023 0929   RDW 14.6 01/14/2023 0929   LYMPHSABS 1.8 01/14/2023 0929   MONOABS 0.7 01/14/2023 0929   EOSABS 0.0 01/14/2023 0929   BASOSABS 0.0 01/14/2023 0929      Latest Ref Rng & Units 01/15/2023    5:06 AM 01/14/2023    9:29 AM 01/11/2023    4:39 AM  CMP  Glucose 70 - 99 mg/dL 147  189  92   BUN 8 - 23 mg/dL 33  22  35   Creatinine 0.44 - 1.00 mg/dL 1.27  1.28  0.96   Sodium 135 - 145 mmol/L 135  138  138   Potassium 3.5 - 5.1 mmol/L 4.3  3.8  3.9   Chloride 98 - 111 mmol/L 102  103  106   CO2 22 - 32 mmol/L '25  24  25   '$ Calcium 8.9 - 10.3 mg/dL 8.7  9.0  9.0      Radiology Studies: ECHOCARDIOGRAM COMPLETE  Result Date: 01/14/2023    ECHOCARDIOGRAM REPORT   Patient Name:   Dana Burns Date of Exam: 01/14/2023 Medical Rec #:  [id number]     Height:       66.0 in Accession #:    [id number]    Weight:       132.0 lb Date of Birth:  [birth year]     BSA:          1.676 m Patient Age:    35  years      BP:           107/46 mmHg Patient Gender: F             HR:           113 bpm. Exam Location:  Inpatient Procedure: 2D Echo Indications:    atrial fibrillation  History:        Patient has prior history of Echocardiogram examinations, most                 recent 08/03/2021. Arrythmias:Atrial Fibrillation and Tachycardia;                 Risk Factors:Diabetes and Hypertension.  Sonographer:    Harvie Junior Referring Phys: Theola Sequin  Sonographer Comments: Technically difficult study due to poor echo windows and suboptimal subcostal window. IMPRESSIONS  1. Left ventricular ejection fraction, by estimation, is 60 to 65%. The left ventricle has normal function. The left ventricle has no regional wall motion abnormalities. Left ventricular diastolic parameters are indeterminate.  2. Right ventricular systolic function is normal. The right ventricular size is normal. There is normal pulmonary artery systolic pressure.  3. Left atrial size was mildly dilated.  4. Right atrial size was mildly dilated.  5. The mitral valve is normal in structure. No evidence of mitral valve regurgitation. No evidence of mitral stenosis.  6. The aortic valve is tricuspid. There is mild calcification of the aortic valve. There is mild thickening of the aortic valve. Aortic valve regurgitation is trivial. Aortic valve sclerosis is present, with no evidence of aortic valve stenosis.  7. The inferior vena cava is normal in size with greater than 50% respiratory variability, suggesting right atrial pressure of 3 mmHg. FINDINGS  Left Ventricle: Left ventricular ejection fraction, by estimation, is 60 to 65%. The left ventricle has normal function. The left ventricle has no regional wall motion abnormalities. The left ventricular internal cavity size was normal in size. There is  no left ventricular hypertrophy. Left ventricular diastolic parameters are indeterminate. Right Ventricle: The right ventricular size is normal. No increase  in right ventricular wall thickness. Right ventricular systolic function is normal. There is normal pulmonary artery systolic pressure. The tricuspid regurgitant velocity is 2.54 m/s, and  with an assumed right atrial pressure of 3 mmHg, the estimated right ventricular systolic pressure is 95.6 mmHg. Left Atrium: Left  atrial size was mildly dilated. Right Atrium: Right atrial size was mildly dilated. Pericardium: There is no evidence of pericardial effusion. Mitral Valve: The mitral valve is normal in structure. No evidence of mitral valve regurgitation. No evidence of mitral valve stenosis. Tricuspid Valve: The tricuspid valve is normal in structure. Tricuspid valve regurgitation is mild . No evidence of tricuspid stenosis. Aortic Valve: The aortic valve is tricuspid. There is mild calcification of the aortic valve. There is mild thickening of the aortic valve. Aortic valve regurgitation is trivial. Aortic regurgitation PHT measures 401 msec. Aortic valve sclerosis is present, with no evidence of aortic valve stenosis. Aortic valve mean gradient measures 4.7 mmHg. Aortic valve peak gradient measures 8.3 mmHg. Aortic valve area, by VTI measures 2.46 cm. Pulmonic Valve: The pulmonic valve was normal in structure. Pulmonic valve regurgitation is not visualized. No evidence of pulmonic stenosis. Aorta: The aortic root is normal in size and structure. Venous: The inferior vena cava is normal in size with greater than 50% respiratory variability, suggesting right atrial pressure of 3 mmHg. IAS/Shunts: No atrial level shunt detected by color flow Doppler.  LEFT VENTRICLE PLAX 2D LVIDd:         3.70 cm     Diastology LVIDs:         2.50 cm     LV e' medial:    5.91 cm/s LV PW:         1.00 cm     LV E/e' medial:  14.1 LV IVS:        1.00 cm     LV e' lateral:   6.85 cm/s LVOT diam:     1.80 cm     LV E/e' lateral: 12.2 LV SV:         46 LV SV Index:   27 LVOT Area:     2.54 cm  LV Volumes (MOD) LV vol d, MOD A2C: 51.3 ml  LV vol d, MOD A4C: 48.9 ml LV vol s, MOD A2C: 21.6 ml LV vol s, MOD A4C: 23.6 ml LV SV MOD A2C:     29.7 ml LV SV MOD A4C:     48.9 ml LV SV MOD BP:      27.6 ml RIGHT VENTRICLE RV Basal diam:  3.40 cm RV Mid diam:    3.00 cm RV S prime:     13.97 cm/s TAPSE (M-mode): 1.2 cm LEFT ATRIUM           Index        RIGHT ATRIUM           Index LA diam:      2.90 cm 1.73 cm/m   RA Area:     13.20 cm LA Vol (A4C): 41.5 ml 24.76 ml/m  RA Volume:   31.90 ml  19.03 ml/m  AORTIC VALVE                    PULMONIC VALVE AV Area (Vmax):    2.24 cm     PV Vmax:       1.05 m/s AV Area (Vmean):   2.14 cm     PV Peak grad:  4.4 mmHg AV Area (VTI):     2.46 cm AV Vmax:           144.00 cm/s AV Vmean:          97.033 cm/s AV VTI:            0.185 m AV Peak  Grad:      8.3 mmHg AV Mean Grad:      4.7 mmHg LVOT Vmax:         127.00 cm/s LVOT Vmean:        81.633 cm/s LVOT VTI:          0.179 m LVOT/AV VTI ratio: 0.97 AI PHT:            401 msec  AORTA Ao Root diam: 3.10 cm Ao Asc diam:  2.70 cm MITRAL VALVE               TRICUSPID VALVE MV Area (PHT): 3.88 cm    TR Peak grad:   25.8 mmHg MV Decel Time: 196 msec    TR Vmax:        254.00 cm/s MR Peak grad: 28.5 mmHg MR Vmax:      267.00 cm/s  SHUNTS MV E velocity: 83.43 cm/s  Systemic VTI:  0.18 m                            Systemic Diam: 1.80 cm Jenkins Rouge MD Electronically signed by Jenkins Rouge MD Signature Date/Time: 01/14/2023/1:42:21 PM    Final      Scheduled Meds:  apixaban  2.5 mg Oral BID   citalopram  20 mg Oral Daily   diazepam  2.5 mg Oral Daily   famotidine  40 mg Oral QHS   feeding supplement  237 mL Oral BID BM   gabapentin  800 mg Oral TID   insulin aspart  0-15 Units Subcutaneous TID WC   metoprolol tartrate  12.5 mg Oral BID   mometasone-formoterol  2 puff Inhalation BID   multivitamin with minerals  1 tablet Oral Daily   rOPINIRole  1 mg Oral QHS   umeclidinium bromide  1 puff Inhalation Daily   venlafaxine XR  37.5 mg Oral Q breakfast    Continuous Infusions:  lactated ringers 75 mL/hr at 01/15/23 1947     LOS: 6 days   Time spent: 29  Nita Sells, MD [facility] To contact the attending provider between 7A-7P or the covering provider during after hours 7P-7A, please log into the web site [email or web address] and access using universal [place] password for that web site. If you do not have the password, please call the hospital operator.  01/16/2023, 6:58 AM

## 2023-01-16 NOTE — Plan of Care (Signed)
  Problem: Activity: Goal: Risk for activity intolerance will decrease Outcome: Progressing   Problem: Nutrition: Goal: Adequate nutrition will be maintained Outcome: Progressing   Problem: Coping: Goal: Level of anxiety will decrease Outcome: Completed/Met   Problem: Elimination: Goal: Will not experience complications related to bowel motility Outcome: Completed/Met Goal: Will not experience complications related to urinary retention Outcome: Completed/Met   Problem: Pain Managment: Goal: General experience of comfort will improve Outcome: Completed/Met   Problem: Safety: Goal: Ability to remain free from injury will improve Outcome: Completed/Met   Problem: Skin Integrity: Goal: Risk for impaired skin integrity will decrease Outcome: Completed/Met

## 2023-01-16 NOTE — Progress Notes (Signed)
Occupational Therapy Treatment Patient Details Name: Dana Burns MRN: [id number] DOB: 11-Oct-1926 Today's Date: 01/16/2023   History of present illness 89 y.o. female with medical history significant of DM2, HTN, Burns cancer, fibromyalgia, chronic pain.  Pt presents to ED with 7 day h/o nonproductive cough, fever, chills, anorexia, worsening generalized weakness. She reported a few episodes of vomiting on Thurs, occasional diarrhea. Dx: RSV bronchitis   OT comments  Patient continues to present with generalized weakness and required +2 today for safety due to persistent complaints of dizziness. Patient min assist for transfers and short ambulation in room with walker and max assist for LB ADLs. She reports being anxious and exhibits shakiness with standing. Her HR was between 99-117 in sitting, then 120-154 with activity. She complained of dizziness at rest and increased dizziness with standing. Her BP was 116/71 in sitting and 104/51 in standing. She is unsteady with ambulation and a high fall risk. Discussed discharge with daughter who will take care of patient at home. Recommended use of BSC for safety.    Recommendations for follow up therapy are one component of a multi-disciplinary discharge planning process, led by the attending physician.  Recommendations may be updated based on patient status, additional functional criteria and insurance authorization.    Follow Up Recommendations        Assistance Recommended at Discharge Frequent or constant Supervision/Assistance  Patient can return home with the following  A little help with walking and/or transfers;Assistance with cooking/housework;A little help with bathing/dressing/bathroom;A lot of help with bathing/dressing/bathroom;A lot of help with walking and/or transfers   Equipment Recommendations  BSC/3in1    Recommendations for Other Services      Precautions / Restrictions Precautions Precautions: Fall Precaution Comments:  monitor HR Restrictions Weight Bearing Restrictions: No       Mobility Bed Mobility Overal bed mobility: Needs Assistance             General bed mobility comments: up in chair    Transfers Overall transfer level: Needs assistance Equipment used: Rolling walker (2 wheels) Transfers: Sit to/from Stand Sit to Stand: Min assist           General transfer comment: Min assist for standing and in room ambulation with walker. Verbal cues for hand placement. Patient dizzy throughout treatment - increased dizziness with standing/mobility     Balance Overall balance assessment: Needs assistance Sitting-balance support: No upper extremity supported, Feet supported Sitting balance-Leahy Scale: Fair     Standing balance support: During functional activity, Reliant on assistive device for balance Standing balance-Leahy Scale: Poor                             ADL either performed or assessed with clinical judgement   ADL Overall ADL's : Needs assistance/impaired                     Lower Body Dressing: Maximal assistance;Sit to/from stand Lower Body Dressing Details (indicate cue type and reason): needed assistance to dof socks and don slip on shoes             Functional mobility during ADLs: Rolling walker (2 wheels)      Extremity/Trunk Assessment Upper Extremity Assessment Upper Extremity Assessment: Generalized weakness   Lower Extremity Assessment Lower Extremity Assessment: Generalized weakness   Cervical / Trunk Assessment Cervical / Trunk Assessment: Kyphotic    Vision   Vision Assessment?: No apparent visual deficits  Perception     Praxis      Cognition Arousal/Alertness: Awake/alert Behavior During Therapy: WFL for tasks assessed/performed Overall Cognitive Status: Within Functional Limits for tasks assessed                                          Exercises      Shoulder Instructions        General Comments      Pertinent Vitals/ Pain       Pain Assessment Pain Assessment: Faces Faces Pain Scale: Hurts little more Pain Location: RT LUE - chronic Pain Descriptors / Indicators: Grimacing, Guarding, Tender Pain Intervention(s): Limited activity within patient's tolerance  Home Living                                          Prior Functioning/Environment              Frequency  Min 2X/week        Progress Toward Goals  OT Goals(current goals can now be found in the care plan section)  Progress towards OT goals: Progressing toward goals  Acute Rehab OT Goals Patient Stated Goal: to feel stronger OT Goal Formulation: With patient Time For Goal Achievement: 01/23/23 Potential to Achieve Goals: [place] Discharge plan remains appropriate    Co-evaluation                 AM-PAC OT "6 Clicks" Daily Activity     Outcome Measure   Help from another person eating meals?: A Little Help from another person taking care of personal grooming?: A Little Help from another person toileting, which includes using toliet, bedpan, or urinal?: A Lot Help from another person bathing (including washing, rinsing, drying)?: A Lot Help from another person to put on and taking off regular upper body clothing?: A Little Help from another person to put on and taking off regular lower body clothing?: A Lot 6 Click Score: 15    End of Session Equipment Utilized During Treatment: Rolling walker (2 wheels);Gait belt  OT Visit Diagnosis: Unsteadiness on feet (R26.81);Muscle weakness (generalized) (M62.81);History of falling (Z91.81)   Activity Tolerance Patient limited by fatigue   Patient Left in chair;with call bell/phone within reach;with family/visitor present;with chair alarm set   Nurse Communication Mobility status        Time: 3382-5053 OT Time Calculation (min): 23 min  Charges: OT General Charges $OT Visit: 1 Visit OT  Treatments $Self Care/Home Management : 8-22 mins  Gustavo Lah, OTR/L [place]  Office [phone]   Lenward Chancellor 01/16/2023, 1:18 PM

## 2023-01-17 DIAGNOSIS — J210 Acute bronchiolitis due to respiratory syncytial virus: Secondary | ICD-10-CM | POA: Diagnosis not present

## 2023-01-17 LAB — COMPREHENSIVE METABOLIC PANEL
ALT: 19 U/L (ref 0–44)
AST: 21 U/L (ref 15–41)
Albumin: 2.6 g/dL — ABNORMAL LOW (ref 3.5–5.0)
Alkaline Phosphatase: 35 U/L — ABNORMAL LOW (ref 38–126)
Anion gap: 6 (ref 5–15)
BUN: 28 mg/dL — ABNORMAL HIGH (ref 8–23)
CO2: 29 mmol/L (ref 22–32)
Calcium: 8.9 mg/dL (ref 8.9–10.3)
Chloride: 102 mmol/L (ref 98–111)
Creatinine, Ser: 1.18 mg/dL — ABNORMAL HIGH (ref 0.44–1.00)
GFR, Estimated: 43 mL/min — ABNORMAL LOW (ref >60)
Glucose, Bld: 100 mg/dL — ABNORMAL HIGH (ref 70–99)
Potassium: 4.3 mmol/L (ref 3.5–5.1)
Sodium: 137 mmol/L (ref 135–145)
Total Bilirubin: 0.4 mg/dL (ref 0.3–1.2)
Total Protein: 5 g/dL — ABNORMAL LOW (ref 6.5–8.1)

## 2023-01-17 LAB — GLUCOSE, CAPILLARY
Glucose-Capillary: 100 mg/dL — ABNORMAL HIGH (ref 70–99)
Glucose-Capillary: 101 mg/dL — ABNORMAL HIGH (ref 70–99)

## 2023-01-17 LAB — MAGNESIUM: Magnesium: 1.8 mg/dL (ref 1.7–2.4)

## 2023-01-17 MED ORDER — METOPROLOL TARTRATE 25 MG PO TABS: 12.5000 mg | ORAL_TABLET | Freq: Three times a day (TID) | ORAL | 0 refills | Status: DC

## 2023-01-17 NOTE — Care Management Important Message (Signed)
Important Message  Patient Details IM Letter given. Name: Dana Burns MRN: [id number] Date of Birth: [birth year]   Medicare Important Message Given:  Yes     Kerin Salen 01/17/2023, 1:02 PM

## 2023-01-17 NOTE — Discharge Summary (Signed)
Physician Discharge Summary  Dana Burns ZJQ:[id number] DOB: [birth year] DOA: 01/08/2023  PCP: Dana Lukes, MD  Admit date: 01/08/2023 Discharge date: 01/17/2023  Time spent: 33 minutes  Recommendations for Outpatient Follow-up:  Needs Chem-7 magnesium and CBC in a week Needs follow-up with cardiology in about 2 to 3 weeks and adjustment of rate control medications-new medication metoprolol 12.5 3 times daily, Eliquis 2.5 twice daily Try to consolidate and de-escalate off of polypharmacy if able (may need to cut back gabapentin dose) Consider A1c in the outpatient setting  Discharge Diagnoses:  MAIN problem for hospitalization   RSV bronchiolitis AKI on admission A-fib RVR new onset CHADVASC >4 DM TY 2  Please see below for itemized issues addressed in HOpsital- refer to other progress notes for clarity if needed  Discharge Condition: Improved  Diet recommendation: Heart healthy diabetic  Filed Weights   01/11/23 1138  Weight: 59.9 kg    History of present illness:  89 year old community dwelling white female Known DM TY 2 HTN HFpEF EF 55% grade 2 DD previously followed by Dr. Geraldo Burns Admit from ED 1/10 status post 7 days nonproductive cough fever chills Found to have RSV with bronchiolitis and acute respiratory failure requiring oxygen 1/12-flipped into A-fib with RVR--Cardiology consulted 1/14   Hospital Course:  Acute hypoxic respiratory failure 2/2 RSV bronchiolitis -Not requiring oxygen at this send - Supportive management and care and seems to be much improved   A-fib RVR new this admission -Some "dizziness" earlier today, orthostatics not performed cardiology saw the patient and recommended in the outpatient setting initiation of amiodarone not digoxin if hypotension precludes rate control and felt that this was all triggered by the RSV as echo was normal - Will increase metoprolol to 12.5 3 times daily, started on Eliquis 2.5 twice daily this admit - Will  need outpatient follow-up in about 4 weeks with cardiology I will CC cardiology point person to ensure that this is done was on IV fluids and this is improved   AKI on admit - Resolving-was on IV fluids and this is improved - Repeat labs in about 1 to 2 weeks including magnesium   HTN -Moderate leak control check trends and adjust meds as needed   DM TY 2 complicated by neuropathy -Continue gabapentin 800 3 times daily, this is a high dose and may be the cause for dizziness - Monitor sugars does not seem to be on any insulin at home -As sugars are below 200 predominantly would discontinue coverage   Moderate malnutrition Depression -Continue Celexa 20, diazepam 1 mg every 12 as needed 2.5 daily and Effexor 37.5    Discharge Exam: Vitals:   01/16/23 2120 01/17/23 0610  BP:  114/60  Pulse:  72  Resp:  16  Temp:  97.8 F (36.6 C)  SpO2: 95% 94%    Subj on day of d/c   Coherent pleasant no distress EOMI NCAT Chest clear no added sound Heart rate in the 100s but patient asymptomatic Abdomen soft   Discharge Instructions   Discharge Instructions     Diet - low sodium heart healthy   Complete by: As directed    Discharge instructions   Complete by: As directed    Please follow-up in the outpatient setting with primary care physician get labs in about a week and they will adjust your medications as they deem necessary Would recommend home health therapy to ensure that you are safe on your feet and you are able to do the  needful we will order necessary equipment You will be on a heart rate control medication called metoprolol and we will give this to 3 times a day-it is important that you take this scheduled as your heart rate will tend to go up and you will need outpatient follow-up with cardiology with regards to this--will CC the cardiologist who saw you in the hospital to ensure that you have a follow-up in place, if you feel dizzy or weak please let someone know and  please record your blood pressure and pulse and let your doctor know   Increase activity slowly   Complete by: As directed       Allergies as of 01/17/2023       Reactions   Tape Itching, Rash   Actos [pioglitazone Hydrochloride] Other (See Comments)   Unknown reaction   Buspar [buspirone Hcl] Other (See Comments)   Unknown reaction   Doxycycline Other (See Comments)   Made the patient "feel funny"- in a negative way   Lipitor [atorvastatin Calcium] Other (See Comments)   "It made me hurt all over."   Penicillins Rash        Medication List     STOP taking these medications    doxycycline 100 MG capsule Commonly known as: VIBRAMYCIN   HYDROcodone bit-homatropine 5-1.5 MG/5ML syrup Commonly known as: HYCODAN   HYDROcodone-acetaminophen 5-325 MG tablet Commonly known as: Norco   pantoprazole 40 MG tablet Commonly known as: PROTONIX       TAKE these medications    acetaminophen 500 MG tablet Commonly known as: TYLENOL Take 500 mg by mouth every 6 (six) hours as needed for pain.   apixaban 2.5 MG Tabs tablet Commonly known as: ELIQUIS Take 1 tablet (2.5 mg total) by mouth 2 (two) times daily.   B-12 IJ Place 1,000 mcg under the tongue daily.   benzonatate 200 MG capsule Commonly known as: TESSALON Take 1 capsule (200 mg total) by mouth 3 (three) times daily as needed for cough.   calcium carbonate 600 MG Tabs tablet Commonly known as: OS-CAL Take 600 mg by mouth 2 (two) times daily with a meal.   citalopram 20 MG tablet Commonly known as: CELEXA TAKE 1 TABLET BY MOUTH EVERY DAY   diazepam 5 MG tablet Commonly known as: VALIUM TAKE 1/2 TO 1 TABLET DAILY AS NEEDED FOR ANXIETY What changed:  how much to take how to take this when to take this additional instructions   famotidine 40 MG tablet Commonly known as: PEPCID Take 1 tablet (40 mg total) by mouth at bedtime.   feeding supplement Liqd Take 237 mLs by mouth 2 (two) times daily between  meals.   gabapentin 800 MG tablet Commonly known as: NEURONTIN Take 1 tablet (800 mg total) by mouth 3 (three) times daily.   guaiFENesin-dextromethorphan 100-10 MG/5ML syrup Commonly known as: ROBITUSSIN DM Take 10 mLs by mouth every 4 (four) hours as needed for cough.   metoprolol tartrate 25 MG tablet Commonly known as: LOPRESSOR Take 0.5 tablets (12.5 mg total) by mouth in the morning, at noon, and at bedtime.   mometasone-formoterol 200-5 MCG/ACT Aero Commonly known as: DULERA Inhale 2 puffs into the lungs 2 (two) times daily.   multivitamin with minerals Tabs tablet Take 1 tablet by mouth daily.   ondansetron 4 MG disintegrating tablet Commonly known as: ZOFRAN-ODT Take 1 tablet (4 mg total) by mouth every 8 (eight) hours as needed for nausea or vomiting.   rOPINIRole 1 MG tablet Commonly  known as: REQUIP Take 1 tablet (1 mg total) by mouth at bedtime.   tiZANidine 4 MG tablet Commonly known as: ZANAFLEX Take 1 tablet (4 mg total) by mouth 2 (two) times daily as needed for muscle spasms.   venlafaxine XR 37.5 MG 24 hr capsule Commonly known as: EFFEXOR-XR Take 1 capsule (37.5 mg total) by mouth daily with breakfast.   Vitamin D3 50 MCG (2000 UT) capsule Take 2,000 Units by mouth daily.               Durable Medical Equipment  (From admission, onward)           Start     Ordered   01/17/23 0842  DME 3-in-1  Once        01/17/23 0856           Allergies  Allergen Reactions   Tape Itching and Rash   Actos [Pioglitazone Hydrochloride] Other (See Comments)    Unknown reaction   Buspar [Buspirone Hcl] Other (See Comments)    Unknown reaction   Doxycycline Other (See Comments)    Made the patient "feel funny"- in a negative way   Lipitor [Atorvastatin Calcium] Other (See Comments)    "It made me hurt all over."   Penicillins Rash    Follow-up Information     [facility] Follow up.   Why: [facility] will provide PT and OT  in the home after discharge.                 The results of significant diagnostics from this hospitalization (including imaging, microbiology, ancillary and laboratory) are listed below for reference.    Significant Diagnostic Studies: ECHOCARDIOGRAM COMPLETE  Result Date: 01/14/2023    ECHOCARDIOGRAM REPORT   Patient Name:   Dana Burns Date of Exam: 01/14/2023 Medical Rec #:  [id number]     Height:       66.0 in Accession #:    [id number]    Weight:       132.0 lb Date of Birth:  08-21-1926     BSA:          1.676 m Patient Age:    89 years      BP:           107/46 mmHg Patient Gender: F             HR:           113 bpm. Exam Location:  Inpatient Procedure: 2D Echo Indications:    atrial fibrillation  History:        Patient has prior history of Echocardiogram examinations, most                 recent 08/03/2021. Arrythmias:Atrial Fibrillation and Tachycardia;                 Risk Factors:Diabetes and Hypertension.  Sonographer:    Harvie Junior Referring Phys: Theola Sequin  Sonographer Comments: Technically difficult study due to poor echo windows and suboptimal subcostal window. IMPRESSIONS  1. Left ventricular ejection fraction, by estimation, is 60 to 65%. The left ventricle has normal function. The left ventricle has no regional wall motion abnormalities. Left ventricular diastolic parameters are indeterminate.  2. Right ventricular systolic function is normal. The right ventricular size is normal. There is normal pulmonary artery systolic pressure.  3. Left atrial size was mildly dilated.  4. Right atrial size was mildly dilated.  5. The mitral valve is normal  in structure. No evidence of mitral valve regurgitation. No evidence of mitral stenosis.  6. The aortic valve is tricuspid. There is mild calcification of the aortic valve. There is mild thickening of the aortic valve. Aortic valve regurgitation is trivial. Aortic valve sclerosis is present, with no evidence of aortic valve  stenosis.  7. The inferior vena cava is normal in size with greater than 50% respiratory variability, suggesting right atrial pressure of 3 mmHg. FINDINGS  Left Ventricle: Left ventricular ejection fraction, by estimation, is 60 to 65%. The left ventricle has normal function. The left ventricle has no regional wall motion abnormalities. The left ventricular internal cavity size was normal in size. There is  no left ventricular hypertrophy. Left ventricular diastolic parameters are indeterminate. Right Ventricle: The right ventricular size is normal. No increase in right ventricular wall thickness. Right ventricular systolic function is normal. There is normal pulmonary artery systolic pressure. The tricuspid regurgitant velocity is 2.54 m/s, and  with an assumed right atrial pressure of 3 mmHg, the estimated right ventricular systolic pressure is 41.9 mmHg. Left Atrium: Left atrial size was mildly dilated. Right Atrium: Right atrial size was mildly dilated. Pericardium: There is no evidence of pericardial effusion. Mitral Valve: The mitral valve is normal in structure. No evidence of mitral valve regurgitation. No evidence of mitral valve stenosis. Tricuspid Valve: The tricuspid valve is normal in structure. Tricuspid valve regurgitation is mild . No evidence of tricuspid stenosis. Aortic Valve: The aortic valve is tricuspid. There is mild calcification of the aortic valve. There is mild thickening of the aortic valve. Aortic valve regurgitation is trivial. Aortic regurgitation PHT measures 401 msec. Aortic valve sclerosis is present, with no evidence of aortic valve stenosis. Aortic valve mean gradient measures 4.7 mmHg. Aortic valve peak gradient measures 8.3 mmHg. Aortic valve area, by VTI measures 2.46 cm. Pulmonic Valve: The pulmonic valve was normal in structure. Pulmonic valve regurgitation is not visualized. No evidence of pulmonic stenosis. Aorta: The aortic root is normal in size and structure. Venous:  The inferior vena cava is normal in size with greater than 50% respiratory variability, suggesting right atrial pressure of 3 mmHg. IAS/Shunts: No atrial level shunt detected by color flow Doppler.  LEFT VENTRICLE PLAX 2D LVIDd:         3.70 cm     Diastology LVIDs:         2.50 cm     LV e' medial:    5.91 cm/s LV PW:         1.00 cm     LV E/e' medial:  14.1 LV IVS:        1.00 cm     LV e' lateral:   6.85 cm/s LVOT diam:     1.80 cm     LV E/e' lateral: 12.2 LV SV:         46 LV SV Index:   27 LVOT Area:     2.54 cm  LV Volumes (MOD) LV vol d, MOD A2C: 51.3 ml LV vol d, MOD A4C: 48.9 ml LV vol s, MOD A2C: 21.6 ml LV vol s, MOD A4C: 23.6 ml LV SV MOD A2C:     29.7 ml LV SV MOD A4C:     48.9 ml LV SV MOD BP:      27.6 ml RIGHT VENTRICLE RV Basal diam:  3.40 cm RV Mid diam:    3.00 cm RV S prime:     13.97 cm/s TAPSE (M-mode): 1.2 cm  LEFT ATRIUM           Index        RIGHT ATRIUM           Index LA diam:      2.90 cm 1.73 cm/m   RA Area:     13.20 cm LA Vol (A4C): 41.5 ml 24.76 ml/m  RA Volume:   31.90 ml  19.03 ml/m  AORTIC VALVE                    PULMONIC VALVE AV Area (Vmax):    2.24 cm     PV Vmax:       1.05 m/s AV Area (Vmean):   2.14 cm     PV Peak grad:  4.4 mmHg AV Area (VTI):     2.46 cm AV Vmax:           144.00 cm/s AV Vmean:          97.033 cm/s AV VTI:            0.185 m AV Peak Grad:      8.3 mmHg AV Mean Grad:      4.7 mmHg LVOT Vmax:         127.00 cm/s LVOT Vmean:        81.633 cm/s LVOT VTI:          0.179 m LVOT/AV VTI ratio: 0.97 AI PHT:            401 msec  AORTA Ao Root diam: 3.10 cm Ao Asc diam:  2.70 cm MITRAL VALVE               TRICUSPID VALVE MV Area (PHT): 3.88 cm    TR Peak grad:   25.8 mmHg MV Decel Time: 196 msec    TR Vmax:        254.00 cm/s MR Peak grad: 28.5 mmHg MR Vmax:      267.00 cm/s  SHUNTS MV E velocity: 83.43 cm/s  Systemic VTI:  0.18 m                            Systemic Diam: 1.80 cm Jenkins Rouge MD Electronically signed by Jenkins Rouge MD Signature Date/Time:  01/14/2023/1:42:21 PM    Final    CT CHEST WO CONTRAST  Result Date: 01/08/2023 CLINICAL DATA:  Cough weakness EXAM: CT CHEST WITHOUT CONTRAST TECHNIQUE: Multidetector CT imaging of the chest was performed following the standard protocol without IV contrast. RADIATION DOSE REDUCTION: This exam was performed according to the departmental dose-optimization program which includes automated exposure control, adjustment of the mA and/or kV according to patient size and/or use of iterative reconstruction technique. COMPARISON:  Chest x-ray in 01/08/2023 FINDINGS: Cardiovascular: Limited assessment without intravenous contrast. Moderate aortic atherosclerosis. No aneurysm. Coronary vascular calcification. Upper normal cardiac size. No pericardial effusion. Mediastinum/Nodes: Midline trachea. No thyroid mass. Subcentimeter mediastinal lymph nodes. Moderate hiatal hernia. Lungs/Pleura: Mild apical scarring. Scattered areas of mild bronchiectasis within the upper lobes and right middle lobe. 4 mm nodule left lung base, series 5, image 107. Other punctate nodules left lung base. Suspicion of minimal tree-in-bud density at the left base and lingula but limited by motion degradation. Upper Abdomen: No acute abnormality. Musculoskeletal: No acute osseous abnormality. IMPRESSION: 1. Scattered areas of mild bronchiectasis within the upper lobes and right middle lobe. Suspicion of minimal tree-in-bud density at the left base and lingula but limited  by motion degradation, suspect for mild respiratory infection, potentially due to atypical organism. 2. Moderate hiatal hernia. 3. Small left lower lobe pulmonary nodules measuring up to 4 mm. No follow-up needed if patient is low-risk (and has no known or suspected primary neoplasm). Non-contrast chest CT can be considered in 12 months if patient is high-risk. This recommendation follows the consensus statement: Guidelines for Management of Incidental Pulmonary Nodules Detected on CT  Images: From the Fleischner Society 2017; Radiology 2017; 284:228-243. 4. Aortic atherosclerosis. Aortic Atherosclerosis (ICD10-I70.0). Electronically Signed   By: Donavan Foil M.D.   On: 01/08/2023 22:49   DG Chest Port 1 View  Result Date: 01/08/2023 CLINICAL DATA:  Cough EXAM: PORTABLE CHEST 1 VIEW COMPARISON:  10/14/2018 FINDINGS: Cardiac silhouette is prominent. Bibasilar subsegmental atelectasis. Lungs are otherwise clear. Normal pulmonary vasculature. Calcified aorta. IMPRESSION: Basilar subsegmental atelectasis or scarring. Prominent cardiac silhouette. Electronically Signed   By: Sammie Bench M.D.   On: 01/08/2023 17:04    Microbiology: Recent Results (from the past 240 hour(s))  Resp panel by RT-PCR (RSV, Flu A&B, Covid) Anterior Nasal Swab     Status: Abnormal   Collection Time: 01/08/23  4:19 PM   Specimen: Anterior Nasal Swab  Result Value Ref Range Status   SARS Coronavirus 2 by RT PCR NEGATIVE NEGATIVE Final    Comment: (NOTE) SARS-CoV-2 target nucleic acids are NOT DETECTED.  The SARS-CoV-2 RNA is generally detectable in upper respiratory specimens during the acute phase of infection. The lowest concentration of SARS-CoV-2 viral copies this assay can detect is 138 copies/mL. A negative result does not preclude SARS-Cov-2 infection and should not be used as the sole basis for treatment or other patient management decisions. A negative result may occur with  improper specimen collection/handling, submission of specimen other than nasopharyngeal swab, presence of viral mutation(s) within the areas targeted by this assay, and inadequate number of viral copies(<138 copies/mL). A negative result must be combined with clinical observations, patient history, and epidemiological information. The expected result is Negative.  Fact Sheet for Patients:  EntrepreneurPulse.com.au  Fact Sheet for Healthcare Providers:   IncredibleEmployment.be  This test is no t yet approved or cleared by the [place] FDA and  has been authorized for detection and/or diagnosis of SARS-CoV-2 by FDA under an Emergency Use Authorization (EUA). This EUA will remain  in effect (meaning this test can be used) for the duration of the COVID-19 declaration under Section 564(b)(1) of the Act, 21 U.S.C.section 360bbb-3(b)(1), unless the authorization is terminated  or revoked sooner.       Influenza A by PCR NEGATIVE NEGATIVE Final   Influenza B by PCR NEGATIVE NEGATIVE Final    Comment: (NOTE) The Xpert Xpress SARS-CoV-2/FLU/RSV plus assay is intended as an aid in the diagnosis of influenza from Nasopharyngeal swab specimens and should not be used as a sole basis for treatment. Nasal washings and aspirates are unacceptable for Xpert Xpress SARS-CoV-2/FLU/RSV testing.  Fact Sheet for Patients: EntrepreneurPulse.com.au  Fact Sheet for Healthcare Providers: IncredibleEmployment.be  This test is not yet approved or cleared by the [place] FDA and has been authorized for detection and/or diagnosis of SARS-CoV-2 by FDA under an Emergency Use Authorization (EUA). This EUA will remain in effect (meaning this test can be used) for the duration of the COVID-19 declaration under Section 564(b)(1) of the Act, 21 U.S.C. section 360bbb-3(b)(1), unless the authorization is terminated or revoked.     Resp Syncytial Virus by PCR POSITIVE (A) NEGATIVE Final  Comment: (NOTE) Fact Sheet for Patients: EntrepreneurPulse.com.au  Fact Sheet for Healthcare Providers: IncredibleEmployment.be  This test is not yet approved or cleared by the [place] FDA and has been authorized for detection and/or diagnosis of SARS-CoV-2 by FDA under an Emergency Use Authorization (EUA). This EUA will remain in effect (meaning this test can be used)  for the duration of the COVID-19 declaration under Section 564(b)(1) of the Act, 21 U.S.C. section 360bbb-3(b)(1), unless the authorization is terminated or revoked.  Performed at [facility], [place] [address], [place], [place] [address]      Labs: Basic Metabolic Panel: Recent Labs  Lab 01/11/23 0439 01/14/23 0929 01/15/23 0506 01/16/23 0542 01/17/23 0523  NA 138 138 135 138 137  K 3.9 3.8 4.3 4.2 4.3  CL 106 103 102 104 102  CO2 '25 24 25 28 29  '$ GLUCOSE 92 189* 147* 107* 100*  BUN 35* 22 33* 28* 28*  CREATININE 0.96 1.28* 1.27* 1.06* 1.18*  CALCIUM 9.0 9.0 8.7* 8.9 8.9  MG  --   --   --   --  1.8   Liver Function Tests: Recent Labs  Lab 01/17/23 0523  AST 21  ALT 19  ALKPHOS 35*  BILITOT 0.4  PROT 5.0*  ALBUMIN 2.6*   No results for input(s): "LIPASE", "AMYLASE" in the last 168 hours. No results for input(s): "AMMONIA" in the last 168 hours. CBC: Recent Labs  Lab 01/11/23 0439 01/14/23 0929  WBC 6.0 7.9  NEUTROABS 4.4 5.4  HGB 12.4 14.1  HCT 39.4 46.9*  MCV 87.0 91.6  PLT 240 285   Cardiac Enzymes: No results for input(s): "CKTOTAL", "CKMB", "CKMBINDEX", "TROPONINI" in the last 168 hours. BNP: BNP (last 3 results) No results for input(s): "BNP" in the last 8760 hours.  ProBNP (last 3 results) No results for input(s): "PROBNP" in the last 8760 hours.  CBG: Recent Labs  Lab 01/16/23 0745 01/16/23 1136 01/16/23 1640 01/16/23 2105 01/17/23 0745  GLUCAP 88 163* 91 146* 100*       Signed:  Nita Sells MD   [facility] 01/17/2023, 8:56 AM

## 2023-01-17 NOTE — TOC Progression Note (Addendum)
Transition of Care [facility]) - Progression Note    Patient Details  Name: Dana Burns MRN: [id number] Date of Birth: 09/10/1926  [facility]) CM/SW Contact  Leeroy Cha, RN Phone Number: 01/17/2023, 9:07 AM  Clinical Narrative:    Dme order for 3 in 1 sent to [facility] at 0908. Referral for hhc sent to [facility] at 0908. [facility]hc set up with [facility].  Expected Discharge Plan: [place] Barriers to Discharge: Continued Medical Work up  Expected Discharge Plan and Services In-house Referral: Clinical Social Work   Post Acute Care Choice: [place] arrangements for the past 2 months: [place] Expected Discharge Date: 01/17/23               DME Arranged: N/A DME Agency: NA       HH Arranged: PT, OT [place] Agency: [place] ([facility]) Date HH Agency Contacted: 01/09/23 Time [place]: [place] Representative spoke with at [place]: [place] ([place]) Interventions SDOH Screenings   Food Insecurity: No Food Insecurity (01/09/2023)  Housing: Low Risk  (01/09/2023)  Transportation Needs: No Transportation Needs (01/09/2023)  Utilities: Not At Risk (01/09/2023)  Alcohol Screen: Low Risk  (03/01/2022)  Depression (PHQ2-9): Low Risk  (01/07/2023)  Financial Resource Strain: Low Risk  (03/01/2022)  Physical Activity: Inactive (03/01/2022)  Stress: No Stress Concern Present (03/01/2022)  Tobacco Use: Low Risk  (01/09/2023)    Readmission Risk Interventions   No data to display

## 2023-01-17 NOTE — TOC Transition Note (Signed)
Transition of Care [facility]) - CM/SW Discharge Note   Patient Details  Name: Dana Burns MRN: [id number] Date of Birth: 1926-12-12  [facility]) CM/SW Contact:  Leeroy Cha, RN Phone Number: 01/17/2023, 10:50 AM   Clinical Narrative:    Dme and hhc in place patient ready for dc.   Final next level of care: Home w Home Health Services Barriers to Discharge: Barriers Resolved   Patient Goals and CMS Choice CMS Medicare.gov Compare Post Acute Care list provided to:: Patient Represenative (must comment) ([name] Pegram (daughter)) Choice offered to / list presented to : Adult Children  Discharge Placement                         Discharge Plan and Services Additional resources added to the After Visit Summary for   In-house Referral: Clinical Social Work Discharge Planning Services: CM Consult Post Acute Care Choice: Durable Medical Equipment, Home Health          DME Arranged: 3-N-1 DME Agency: [facility] Date DME Agency Contacted: 01/17/23 Time DME Agency Contacted: [phone] Representative spoke with at DME Agency: [place]: PT, OT [place] Agency: [place] ([place]) Date [place]: 01/09/23 Time [place] Agency: [id number] Representative spoke with at [place]: [place] ([place]) Interventions SDOH Screenings   Food Insecurity: No Food Insecurity (01/09/2023)  Housing: Low Risk  (01/09/2023)  Transportation Needs: No Transportation Needs (01/09/2023)  Utilities: Not At Risk (01/09/2023)  Alcohol Screen: Low Risk  (03/01/2022)  Depression (PHQ2-9): Low Risk  (01/07/2023)  Financial Resource Strain: Low Risk  (03/01/2022)  Physical Activity: Inactive (03/01/2022)  Stress: No Stress Concern Present (03/01/2022)  Tobacco Use: Low Risk  (01/09/2023)     Readmission Risk Interventions   No data to display

## 2023-01-17 NOTE — Progress Notes (Signed)
Patient to be discharged to home this afternoon. Patient and Patient's Daughter given discharge teaching including all discharge Medications and schedules for these Medications. Understanding verbalized and discharge AVS with the Patient at time of discharge

## 2023-01-18 ENCOUNTER — Telehealth: Payer: Self-pay | Admitting: *Deleted

## 2023-01-18 ENCOUNTER — Telehealth: Payer: Self-pay | Admitting: Family Medicine

## 2023-01-18 ENCOUNTER — Encounter: Payer: Self-pay | Admitting: *Deleted

## 2023-01-18 NOTE — Telephone Encounter (Signed)
Called HH back lvm for verbal order

## 2023-01-18 NOTE — Telephone Encounter (Signed)
Colleen with [facility] calling to request verbal orders on delaying visit per patient request. Please call [phone] to advise.

## 2023-01-18 NOTE — Progress Notes (Signed)
  Care Coordination  Note  01/18/2023 Name: Dana Burns MRN: [id number] DOB: 1926/07/27  Dana Burns is a 89 y.o. year old primary care patient of Mosie Lukes, MD. I reached out to Dana Burns by phone today to assist with scheduling a follow up appointment. Dana Burns verbally consented to my assistance.       Follow up plan: Unsuccessful telephone outreach attempt made. A HIPAA compliant phone message was left for the patient providing contact information and requesting a return call.   Julian Hy, [place] Direct Dial: [phone]

## 2023-01-18 NOTE — Patient Outreach (Deleted)
  Care Coordination TOC Note {TOC NOTES:27765}

## 2023-01-18 NOTE — Patient Outreach (Addendum)
  Care Coordination [facility] Note Transition Care Management Unsuccessful Follow-up Telephone Call  Date of discharge and from where:  Thursday, 11/18/23 [facility]; RSV bronciolitis; AKI; new onset A-Fib with RVR  Attempts:  1st Attempt  Reason for unsuccessful TCM follow-up call:  Left voice message  Oneta Rack, RN, BSN, CCRN Alumnus RN CM Care Coordination/ Transition of [place] Management [phone]: direct office

## 2023-01-18 NOTE — Patient Outreach (Addendum)
Care Coordination [facility] Note Transition Care Management Follow-up Telephone Call Date of discharge and from where: Thursday, 01/17/23, [facility]; RSV bronchiolitis/ AKI How have you been since you were released from the hospital? Per caregiver/ daughter [name], on [facility] DPR: "Things are going okay; I have gotten her medications and she is taking them like they told us to, no questions or problems so far.  I have to help her with just about everything, I live with her and am with her all the time.  I have not heard from the home health team yet.  Thanks for having someone call to get this appointment scheduled with Dr. Randel Pigg......;"  patient suddenly begins screaming, yelling, and crying in background...Marland KitchenMarland KitchenMarland Kitchen was placed on extended hold as daughter addressed patient's needs; "Okay, she is okay, I just had to help her get out of her chair and onto the bedside commode.... she is okay now" Any questions or concerns? No  Items Reviewed: Did the pt receive and understand the discharge instructions provided? Yes  Medications obtained and verified? Yes  full medication review completed; no discrepancies or concerns identified; confirmed daughter has obtained and is giving all newly prescribed hospital discharge medications and verbalizes good understanding of same; confirmed daughter/ caregiver manages all aspects of medication administration and denies questions/ concerns; caregiver verbalizes very good understanding of the purpose/ scheduling, and dosing of all medications Other? No  Any new allergies since your discharge? No  Dietary orders reviewed? Yes Do you have support at home? Yes  patient resides with her daughter who provides assistance with all daily care needs/ ADL's  Home Care and Equipment/Supplies: Were home health services ordered? yes If so, what is the name of the agency? [facility]  Has the agency set up a time to come to the patient's home? No- not yet; explained to caregiver  that it may take 1-2 days before agency contacts them: provided phone number to agency and provided my direct contact information should caregiver need assistance in the coming days with initiation of services Were any new equipment or medical supplies ordered?  Yes: BSC- 3-in-1 What is the name of the medical supply agency? [employer] Were you able to get the supplies/equipment? yes Do you have any questions related to the use of the equipment or supplies? No  Functional Questionnaire: (I = Independent and D = Dependent) ADLs: D  daughter provides assistance/ supervision with all daily care needs/ ADL's  Bathing/Dressing- D daughter provides assistance/ supervision with all daily care needs/ ADL's  Meal Prep- D daughter provides assistance/ supervision with all daily care needs/ ADL's  Eating- I  Maintaining continence- D daughter provides assistance/ supervision with all daily care needs/ ADL's  Transferring/Ambulation- D daughter provides assistance/ supervision with all daily care needs/ ADL's  Managing Meds- D daughter manages all aspects of medication administration  Follow up appointments reviewed:  PCP Hospital f/u appt confirmed? No  Scheduled to see - on - @ - sent scheduling request to scheduling care guide to facilitate hospital follow up office visit with PCP [facility] f/u appt confirmed? Yes  Scheduled to see cardiology provider on Friday 02/15/23 @ 1:30 pm Are transportation arrangements needed? No  If their condition worsens, is the pt aware to call PCP or go to the Emergency Dept.? Yes Was the patient provided with contact information for the PCP's office or ED? No; caregiver declined- reports already has contact information for all care providers Was to pt encouraged to call back with questions  or concerns? Yes- provided my direct contact information should questions, concerns, needs arise post- today's TOC call  SDOH assessments and interventions completed:    Yes SDOH Interventions Today    Flowsheet Row Most Recent Value  SDOH Interventions   Food Insecurity Interventions Intervention Not Indicated  Transportation Interventions Intervention Not Indicated  [daughter provides transportation]      Care Coordination Interventions:  PCP follow up appointment requested Provided education around initiation of home health services; full medication review completed    Encounter Outcome:  Pt. Visit Completed    Oneta Rack, RN, BSN, CCRN Alumnus RN CM Care Coordination/ Transition of [place] Management [phone]: direct office

## 2023-01-18 NOTE — Progress Notes (Signed)
  Care Coordination  Note  01/18/2023 Name: TENESSA MARSEE MRN: [id number] DOB: September 01, 1926  [name] is a 89 y.o. year old primary care patient of Mosie Lukes, MD. I reached out to [name] by phone today to assist with scheduling a follow up appointment. [name] verbally consented to my assistance.       Follow up plan: Hospital Follow Up appointment scheduled with Caleen Jobs, NP) on (01/24/2023) at (3pm).  Julian Hy, [place] Direct Dial: [phone]

## 2023-01-19 DIAGNOSIS — F32A Depression, unspecified: Secondary | ICD-10-CM | POA: Diagnosis not present

## 2023-01-19 DIAGNOSIS — I130 Hypertensive heart and chronic kidney disease with heart failure and stage 1 through stage 4 chronic kidney disease, or unspecified chronic kidney disease: Secondary | ICD-10-CM | POA: Diagnosis not present

## 2023-01-19 DIAGNOSIS — Z9181 History of falling: Secondary | ICD-10-CM | POA: Diagnosis not present

## 2023-01-19 DIAGNOSIS — K5900 Constipation, unspecified: Secondary | ICD-10-CM | POA: Diagnosis not present

## 2023-01-19 DIAGNOSIS — Z7951 Long term (current) use of inhaled steroids: Secondary | ICD-10-CM | POA: Diagnosis not present

## 2023-01-19 DIAGNOSIS — M8580 Other specified disorders of bone density and structure, unspecified site: Secondary | ICD-10-CM | POA: Diagnosis not present

## 2023-01-19 DIAGNOSIS — G2581 Restless legs syndrome: Secondary | ICD-10-CM | POA: Diagnosis not present

## 2023-01-19 DIAGNOSIS — I4891 Unspecified atrial fibrillation: Secondary | ICD-10-CM | POA: Diagnosis not present

## 2023-01-19 DIAGNOSIS — K219 Gastro-esophageal reflux disease without esophagitis: Secondary | ICD-10-CM | POA: Diagnosis not present

## 2023-01-19 DIAGNOSIS — I5030 Unspecified diastolic (congestive) heart failure: Secondary | ICD-10-CM | POA: Diagnosis not present

## 2023-01-19 DIAGNOSIS — E538 Deficiency of other specified B group vitamins: Secondary | ICD-10-CM | POA: Diagnosis not present

## 2023-01-19 DIAGNOSIS — E1122 Type 2 diabetes mellitus with diabetic chronic kidney disease: Secondary | ICD-10-CM | POA: Diagnosis not present

## 2023-01-19 DIAGNOSIS — E1142 Type 2 diabetes mellitus with diabetic polyneuropathy: Secondary | ICD-10-CM | POA: Diagnosis not present

## 2023-01-19 DIAGNOSIS — G894 Chronic pain syndrome: Secondary | ICD-10-CM | POA: Diagnosis not present

## 2023-01-19 DIAGNOSIS — M1990 Unspecified osteoarthritis, unspecified site: Secondary | ICD-10-CM | POA: Diagnosis not present

## 2023-01-19 DIAGNOSIS — E440 Moderate protein-calorie malnutrition: Secondary | ICD-10-CM | POA: Diagnosis not present

## 2023-01-19 DIAGNOSIS — M797 Fibromyalgia: Secondary | ICD-10-CM | POA: Diagnosis not present

## 2023-01-19 DIAGNOSIS — Z7901 Long term (current) use of anticoagulants: Secondary | ICD-10-CM | POA: Diagnosis not present

## 2023-01-19 DIAGNOSIS — N189 Chronic kidney disease, unspecified: Secondary | ICD-10-CM | POA: Diagnosis not present

## 2023-01-19 DIAGNOSIS — Z85038 Personal history of other malignant neoplasm of large intestine: Secondary | ICD-10-CM | POA: Diagnosis not present

## 2023-01-19 DIAGNOSIS — E782 Mixed hyperlipidemia: Secondary | ICD-10-CM | POA: Diagnosis not present

## 2023-01-21 DIAGNOSIS — E1142 Type 2 diabetes mellitus with diabetic polyneuropathy: Secondary | ICD-10-CM | POA: Diagnosis not present

## 2023-01-21 DIAGNOSIS — E538 Deficiency of other specified B group vitamins: Secondary | ICD-10-CM | POA: Diagnosis not present

## 2023-01-21 DIAGNOSIS — K5900 Constipation, unspecified: Secondary | ICD-10-CM | POA: Diagnosis not present

## 2023-01-21 DIAGNOSIS — K219 Gastro-esophageal reflux disease without esophagitis: Secondary | ICD-10-CM | POA: Diagnosis not present

## 2023-01-21 DIAGNOSIS — Z9181 History of falling: Secondary | ICD-10-CM | POA: Diagnosis not present

## 2023-01-21 DIAGNOSIS — I130 Hypertensive heart and chronic kidney disease with heart failure and stage 1 through stage 4 chronic kidney disease, or unspecified chronic kidney disease: Secondary | ICD-10-CM | POA: Diagnosis not present

## 2023-01-21 DIAGNOSIS — Z85038 Personal history of other malignant neoplasm of large intestine: Secondary | ICD-10-CM | POA: Diagnosis not present

## 2023-01-21 DIAGNOSIS — G894 Chronic pain syndrome: Secondary | ICD-10-CM | POA: Diagnosis not present

## 2023-01-21 DIAGNOSIS — I5030 Unspecified diastolic (congestive) heart failure: Secondary | ICD-10-CM | POA: Diagnosis not present

## 2023-01-21 DIAGNOSIS — Z7951 Long term (current) use of inhaled steroids: Secondary | ICD-10-CM | POA: Diagnosis not present

## 2023-01-21 DIAGNOSIS — M8580 Other specified disorders of bone density and structure, unspecified site: Secondary | ICD-10-CM | POA: Diagnosis not present

## 2023-01-21 DIAGNOSIS — F32A Depression, unspecified: Secondary | ICD-10-CM | POA: Diagnosis not present

## 2023-01-21 DIAGNOSIS — G2581 Restless legs syndrome: Secondary | ICD-10-CM | POA: Diagnosis not present

## 2023-01-21 DIAGNOSIS — E440 Moderate protein-calorie malnutrition: Secondary | ICD-10-CM | POA: Diagnosis not present

## 2023-01-21 DIAGNOSIS — M797 Fibromyalgia: Secondary | ICD-10-CM | POA: Diagnosis not present

## 2023-01-21 DIAGNOSIS — M1990 Unspecified osteoarthritis, unspecified site: Secondary | ICD-10-CM | POA: Diagnosis not present

## 2023-01-21 DIAGNOSIS — Z7901 Long term (current) use of anticoagulants: Secondary | ICD-10-CM | POA: Diagnosis not present

## 2023-01-21 DIAGNOSIS — I4891 Unspecified atrial fibrillation: Secondary | ICD-10-CM | POA: Diagnosis not present

## 2023-01-21 DIAGNOSIS — E1122 Type 2 diabetes mellitus with diabetic chronic kidney disease: Secondary | ICD-10-CM | POA: Diagnosis not present

## 2023-01-21 DIAGNOSIS — E782 Mixed hyperlipidemia: Secondary | ICD-10-CM | POA: Diagnosis not present

## 2023-01-21 DIAGNOSIS — N189 Chronic kidney disease, unspecified: Secondary | ICD-10-CM | POA: Diagnosis not present

## 2023-01-23 DIAGNOSIS — N189 Chronic kidney disease, unspecified: Secondary | ICD-10-CM | POA: Diagnosis not present

## 2023-01-23 DIAGNOSIS — M8580 Other specified disorders of bone density and structure, unspecified site: Secondary | ICD-10-CM | POA: Diagnosis not present

## 2023-01-23 DIAGNOSIS — I130 Hypertensive heart and chronic kidney disease with heart failure and stage 1 through stage 4 chronic kidney disease, or unspecified chronic kidney disease: Secondary | ICD-10-CM | POA: Diagnosis not present

## 2023-01-23 DIAGNOSIS — E538 Deficiency of other specified B group vitamins: Secondary | ICD-10-CM | POA: Diagnosis not present

## 2023-01-23 DIAGNOSIS — Z85038 Personal history of other malignant neoplasm of large intestine: Secondary | ICD-10-CM | POA: Diagnosis not present

## 2023-01-23 DIAGNOSIS — K219 Gastro-esophageal reflux disease without esophagitis: Secondary | ICD-10-CM | POA: Diagnosis not present

## 2023-01-23 DIAGNOSIS — I4891 Unspecified atrial fibrillation: Secondary | ICD-10-CM | POA: Diagnosis not present

## 2023-01-23 DIAGNOSIS — Z7951 Long term (current) use of inhaled steroids: Secondary | ICD-10-CM | POA: Diagnosis not present

## 2023-01-23 DIAGNOSIS — M1990 Unspecified osteoarthritis, unspecified site: Secondary | ICD-10-CM | POA: Diagnosis not present

## 2023-01-23 DIAGNOSIS — K5900 Constipation, unspecified: Secondary | ICD-10-CM | POA: Diagnosis not present

## 2023-01-23 DIAGNOSIS — Z7901 Long term (current) use of anticoagulants: Secondary | ICD-10-CM | POA: Diagnosis not present

## 2023-01-23 DIAGNOSIS — E1142 Type 2 diabetes mellitus with diabetic polyneuropathy: Secondary | ICD-10-CM | POA: Diagnosis not present

## 2023-01-23 DIAGNOSIS — F32A Depression, unspecified: Secondary | ICD-10-CM | POA: Diagnosis not present

## 2023-01-23 DIAGNOSIS — I5030 Unspecified diastolic (congestive) heart failure: Secondary | ICD-10-CM | POA: Diagnosis not present

## 2023-01-23 DIAGNOSIS — E1122 Type 2 diabetes mellitus with diabetic chronic kidney disease: Secondary | ICD-10-CM | POA: Diagnosis not present

## 2023-01-23 DIAGNOSIS — Z9181 History of falling: Secondary | ICD-10-CM | POA: Diagnosis not present

## 2023-01-23 DIAGNOSIS — M797 Fibromyalgia: Secondary | ICD-10-CM | POA: Diagnosis not present

## 2023-01-23 DIAGNOSIS — E440 Moderate protein-calorie malnutrition: Secondary | ICD-10-CM | POA: Diagnosis not present

## 2023-01-23 DIAGNOSIS — G894 Chronic pain syndrome: Secondary | ICD-10-CM | POA: Diagnosis not present

## 2023-01-23 DIAGNOSIS — E782 Mixed hyperlipidemia: Secondary | ICD-10-CM | POA: Diagnosis not present

## 2023-01-23 DIAGNOSIS — G2581 Restless legs syndrome: Secondary | ICD-10-CM | POA: Diagnosis not present

## 2023-01-24 ENCOUNTER — Inpatient Hospital Stay: Payer: Medicare Other | Admitting: Family Medicine

## 2023-01-25 ENCOUNTER — Ambulatory Visit: Payer: Medicare Other

## 2023-01-25 ENCOUNTER — Ambulatory Visit: Payer: Medicare Other | Admitting: Cardiovascular Disease

## 2023-01-28 ENCOUNTER — Encounter: Payer: Self-pay | Admitting: Family Medicine

## 2023-01-28 ENCOUNTER — Telehealth (INDEPENDENT_AMBULATORY_CARE_PROVIDER_SITE_OTHER): Payer: Medicare Other | Admitting: Family Medicine

## 2023-01-28 DIAGNOSIS — Z09 Encounter for follow-up examination after completed treatment for conditions other than malignant neoplasm: Secondary | ICD-10-CM

## 2023-01-28 DIAGNOSIS — R5383 Other fatigue: Secondary | ICD-10-CM

## 2023-01-28 NOTE — Progress Notes (Signed)
Virtual Video Visit via MyChart Note  I connected with  Dana Burns on 01/28/23 at  3:20 PM EST by the video enabled telemedicine application for MyChart, and verified that I am speaking with the correct person using two identifiers.   I introduced myself as a Designer, jewellery with the practice. We discussed the limitations of evaluation and management by telemedicine and the availability of in person appointments. The patient expressed understanding and agreed to proceed.  Participating parties in this visit include: The patient and the nurse practitioner listed, daughter Dana Burns present as well.  The patient is: At home I am: In the office - [facility]  Subjective:    CC:  Chief Complaint  Patient presents with   Hospitalization Follow-up    HPI: Dana Burns is a 89 y.o. year old female presenting today via [place] today for ED follow-up.   Patient was recently hospitalized at [facility] (January 9-18, 2024) for RSV bronchiolitis and new onset Afib RVR. Patient was started on metoprolol 12.5 mg TID and Eliquis 2.5 mg BID and is scheduled to follow-up with cardiology in a few weeks. Patient states that breathing is fair, almost back to baseline. She is using Dulera twice daily and benzonatate (first dose today) for cough. She has been using incentive spirometer throughout the day. She has been fatigued/weak since being home, but is ambulating with walker in the home. HH PT and OT have been set up. She continues to complain of neuropathy related pain and bilateral leg pain which has been worked up by ortho and rheum, and thought to be fibromyalgia related. Some days are better than others. Today she did not feel good enough to come for an in-person appointment, so they changed to virtual. Reports she is trying to eat well and stay hydrated. She is drinking ensures. Reports using the bathroom like normal; no GI/GU symptoms.   Patient denies any chest pain,  palpitations, dyspnea, wheezing, edema, recurrent headaches, vision changes.        Past medical history, Surgical history, Family history not pertinant except as noted below, Social history, Allergies, and medications have been entered into the medical record, reviewed, and corrections made.   Review of Systems:  All review of systems negative except what is listed in the HPI   Objective:    General:  Speaking clearly in complete sentences. Absent shortness of breath noted.   Alert and oriented x3.   Normal judgment.  Absent acute distress.   Impression and Recommendations:    1. Hospital discharge follow-up 2. Fatigue, unspecified type Patient is doing fair. No acute concerns today. Continue all medications as prescribed and supportive measures. I would like to update some labs - will place future orders if they are able to schedule a lab appointment in the near future. They are scheduled to see PCP in 1 month and cardiology in 2 weeks. Patient/daughter aware of signs/symptoms requiring further/urgent evaluation.    - CBC; Future - Basic Metabolic Panel (BMET); Future - Magnesium; Future    Follow-up if symptoms worsen or fail to improve.    I discussed the assessment and treatment plan with the patient. The patient was provided an opportunity to ask questions and all were answered. The patient agreed with the plan and demonstrated an understanding of the instructions.   The patient was advised to call back or seek an in-person evaluation if the symptoms worsen or if the condition fails to improve as  anticipated.    Terrilyn Saver, NP

## 2023-01-29 DIAGNOSIS — G2581 Restless legs syndrome: Secondary | ICD-10-CM | POA: Diagnosis not present

## 2023-01-29 DIAGNOSIS — Z7901 Long term (current) use of anticoagulants: Secondary | ICD-10-CM | POA: Diagnosis not present

## 2023-01-29 DIAGNOSIS — M8580 Other specified disorders of bone density and structure, unspecified site: Secondary | ICD-10-CM | POA: Diagnosis not present

## 2023-01-29 DIAGNOSIS — E1122 Type 2 diabetes mellitus with diabetic chronic kidney disease: Secondary | ICD-10-CM | POA: Diagnosis not present

## 2023-01-29 DIAGNOSIS — F32A Depression, unspecified: Secondary | ICD-10-CM | POA: Diagnosis not present

## 2023-01-29 DIAGNOSIS — Z7951 Long term (current) use of inhaled steroids: Secondary | ICD-10-CM | POA: Diagnosis not present

## 2023-01-29 DIAGNOSIS — I5030 Unspecified diastolic (congestive) heart failure: Secondary | ICD-10-CM | POA: Diagnosis not present

## 2023-01-29 DIAGNOSIS — Z85038 Personal history of other malignant neoplasm of large intestine: Secondary | ICD-10-CM | POA: Diagnosis not present

## 2023-01-29 DIAGNOSIS — N189 Chronic kidney disease, unspecified: Secondary | ICD-10-CM | POA: Diagnosis not present

## 2023-01-29 DIAGNOSIS — M797 Fibromyalgia: Secondary | ICD-10-CM | POA: Diagnosis not present

## 2023-01-29 DIAGNOSIS — M1990 Unspecified osteoarthritis, unspecified site: Secondary | ICD-10-CM | POA: Diagnosis not present

## 2023-01-29 DIAGNOSIS — E782 Mixed hyperlipidemia: Secondary | ICD-10-CM | POA: Diagnosis not present

## 2023-01-29 DIAGNOSIS — I4891 Unspecified atrial fibrillation: Secondary | ICD-10-CM | POA: Diagnosis not present

## 2023-01-29 DIAGNOSIS — K219 Gastro-esophageal reflux disease without esophagitis: Secondary | ICD-10-CM | POA: Diagnosis not present

## 2023-01-29 DIAGNOSIS — E538 Deficiency of other specified B group vitamins: Secondary | ICD-10-CM | POA: Diagnosis not present

## 2023-01-29 DIAGNOSIS — G894 Chronic pain syndrome: Secondary | ICD-10-CM | POA: Diagnosis not present

## 2023-01-29 DIAGNOSIS — E440 Moderate protein-calorie malnutrition: Secondary | ICD-10-CM | POA: Diagnosis not present

## 2023-01-29 DIAGNOSIS — Z9181 History of falling: Secondary | ICD-10-CM | POA: Diagnosis not present

## 2023-01-29 DIAGNOSIS — K5900 Constipation, unspecified: Secondary | ICD-10-CM | POA: Diagnosis not present

## 2023-01-29 DIAGNOSIS — I130 Hypertensive heart and chronic kidney disease with heart failure and stage 1 through stage 4 chronic kidney disease, or unspecified chronic kidney disease: Secondary | ICD-10-CM | POA: Diagnosis not present

## 2023-01-29 DIAGNOSIS — E1142 Type 2 diabetes mellitus with diabetic polyneuropathy: Secondary | ICD-10-CM | POA: Diagnosis not present

## 2023-02-04 ENCOUNTER — Ambulatory Visit: Payer: Medicare Other | Admitting: Cardiovascular Disease

## 2023-02-04 DIAGNOSIS — M8580 Other specified disorders of bone density and structure, unspecified site: Secondary | ICD-10-CM | POA: Diagnosis not present

## 2023-02-04 DIAGNOSIS — M797 Fibromyalgia: Secondary | ICD-10-CM | POA: Diagnosis not present

## 2023-02-04 DIAGNOSIS — K5900 Constipation, unspecified: Secondary | ICD-10-CM | POA: Diagnosis not present

## 2023-02-04 DIAGNOSIS — G894 Chronic pain syndrome: Secondary | ICD-10-CM | POA: Diagnosis not present

## 2023-02-04 DIAGNOSIS — E440 Moderate protein-calorie malnutrition: Secondary | ICD-10-CM | POA: Diagnosis not present

## 2023-02-04 DIAGNOSIS — E538 Deficiency of other specified B group vitamins: Secondary | ICD-10-CM | POA: Diagnosis not present

## 2023-02-04 DIAGNOSIS — Z85038 Personal history of other malignant neoplasm of large intestine: Secondary | ICD-10-CM | POA: Diagnosis not present

## 2023-02-04 DIAGNOSIS — I4891 Unspecified atrial fibrillation: Secondary | ICD-10-CM | POA: Diagnosis not present

## 2023-02-04 DIAGNOSIS — Z7901 Long term (current) use of anticoagulants: Secondary | ICD-10-CM | POA: Diagnosis not present

## 2023-02-04 DIAGNOSIS — K219 Gastro-esophageal reflux disease without esophagitis: Secondary | ICD-10-CM | POA: Diagnosis not present

## 2023-02-04 DIAGNOSIS — E1122 Type 2 diabetes mellitus with diabetic chronic kidney disease: Secondary | ICD-10-CM | POA: Diagnosis not present

## 2023-02-04 DIAGNOSIS — I5030 Unspecified diastolic (congestive) heart failure: Secondary | ICD-10-CM | POA: Diagnosis not present

## 2023-02-04 DIAGNOSIS — Z9181 History of falling: Secondary | ICD-10-CM | POA: Diagnosis not present

## 2023-02-04 DIAGNOSIS — E782 Mixed hyperlipidemia: Secondary | ICD-10-CM | POA: Diagnosis not present

## 2023-02-04 DIAGNOSIS — I130 Hypertensive heart and chronic kidney disease with heart failure and stage 1 through stage 4 chronic kidney disease, or unspecified chronic kidney disease: Secondary | ICD-10-CM | POA: Diagnosis not present

## 2023-02-04 DIAGNOSIS — N189 Chronic kidney disease, unspecified: Secondary | ICD-10-CM | POA: Diagnosis not present

## 2023-02-04 DIAGNOSIS — M1990 Unspecified osteoarthritis, unspecified site: Secondary | ICD-10-CM | POA: Diagnosis not present

## 2023-02-04 DIAGNOSIS — G2581 Restless legs syndrome: Secondary | ICD-10-CM | POA: Diagnosis not present

## 2023-02-04 DIAGNOSIS — Z7951 Long term (current) use of inhaled steroids: Secondary | ICD-10-CM | POA: Diagnosis not present

## 2023-02-04 DIAGNOSIS — F32A Depression, unspecified: Secondary | ICD-10-CM | POA: Diagnosis not present

## 2023-02-04 DIAGNOSIS — E1142 Type 2 diabetes mellitus with diabetic polyneuropathy: Secondary | ICD-10-CM | POA: Diagnosis not present

## 2023-02-05 DIAGNOSIS — K5900 Constipation, unspecified: Secondary | ICD-10-CM | POA: Diagnosis not present

## 2023-02-05 DIAGNOSIS — F32A Depression, unspecified: Secondary | ICD-10-CM | POA: Diagnosis not present

## 2023-02-05 DIAGNOSIS — E1142 Type 2 diabetes mellitus with diabetic polyneuropathy: Secondary | ICD-10-CM | POA: Diagnosis not present

## 2023-02-05 DIAGNOSIS — I5030 Unspecified diastolic (congestive) heart failure: Secondary | ICD-10-CM | POA: Diagnosis not present

## 2023-02-05 DIAGNOSIS — E1122 Type 2 diabetes mellitus with diabetic chronic kidney disease: Secondary | ICD-10-CM | POA: Diagnosis not present

## 2023-02-05 DIAGNOSIS — N189 Chronic kidney disease, unspecified: Secondary | ICD-10-CM | POA: Diagnosis not present

## 2023-02-05 DIAGNOSIS — E538 Deficiency of other specified B group vitamins: Secondary | ICD-10-CM | POA: Diagnosis not present

## 2023-02-05 DIAGNOSIS — M8580 Other specified disorders of bone density and structure, unspecified site: Secondary | ICD-10-CM | POA: Diagnosis not present

## 2023-02-05 DIAGNOSIS — I4891 Unspecified atrial fibrillation: Secondary | ICD-10-CM | POA: Diagnosis not present

## 2023-02-05 DIAGNOSIS — G894 Chronic pain syndrome: Secondary | ICD-10-CM | POA: Diagnosis not present

## 2023-02-05 DIAGNOSIS — Z7901 Long term (current) use of anticoagulants: Secondary | ICD-10-CM | POA: Diagnosis not present

## 2023-02-05 DIAGNOSIS — K219 Gastro-esophageal reflux disease without esophagitis: Secondary | ICD-10-CM | POA: Diagnosis not present

## 2023-02-05 DIAGNOSIS — I130 Hypertensive heart and chronic kidney disease with heart failure and stage 1 through stage 4 chronic kidney disease, or unspecified chronic kidney disease: Secondary | ICD-10-CM | POA: Diagnosis not present

## 2023-02-05 DIAGNOSIS — Z7951 Long term (current) use of inhaled steroids: Secondary | ICD-10-CM | POA: Diagnosis not present

## 2023-02-05 DIAGNOSIS — E782 Mixed hyperlipidemia: Secondary | ICD-10-CM | POA: Diagnosis not present

## 2023-02-05 DIAGNOSIS — M1990 Unspecified osteoarthritis, unspecified site: Secondary | ICD-10-CM | POA: Diagnosis not present

## 2023-02-05 DIAGNOSIS — Z85038 Personal history of other malignant neoplasm of large intestine: Secondary | ICD-10-CM | POA: Diagnosis not present

## 2023-02-05 DIAGNOSIS — M797 Fibromyalgia: Secondary | ICD-10-CM | POA: Diagnosis not present

## 2023-02-05 DIAGNOSIS — E440 Moderate protein-calorie malnutrition: Secondary | ICD-10-CM | POA: Diagnosis not present

## 2023-02-05 DIAGNOSIS — Z9181 History of falling: Secondary | ICD-10-CM | POA: Diagnosis not present

## 2023-02-05 DIAGNOSIS — G2581 Restless legs syndrome: Secondary | ICD-10-CM | POA: Diagnosis not present

## 2023-02-13 NOTE — Progress Notes (Deleted)
Cardiology Clinic Note   Patient Name: Dana Burns Date of Encounter: 02/14/2023  Primary Care Provider:  Mosie Lukes, MD Primary Cardiologist:  Jenean Lindau, MD  Patient Profile    Dana Burns 89 year old female presents to the clinic today for follow-up evaluation of her essential hypertension, diastolic CHF, and palpitations.  Past Medical History    Past Medical History:  Diagnosis Date   Abdominal pain 2017-10-07   Allergy    sneeze, rhinorrhea in am   Anxiety state 09/03/2013   Breast mass, right 10/14/2012   Chicken pox as a child   Chronic pain syndrome 01/23/2015   Chronic renal insufficiency 03/17/2011   Colon cancer ([place])    Complication of anesthesia    agitated when waking up, wakes up shaking, "like  I'm going into shock"   Constipation 2017/10/07   Depression    husband died  3 months ago, pt. admits depression  currently    Diabetic neuropathy ([place]) 03/17/2011   DM (diabetes mellitus) type 2, uncontrolled, with ketoacidosis ([place]) 03/17/2011   Ductal hyperplasia of breast 03/17/2011   Essential hypertension, benign 05/31/2010   Qualifier: Diagnosis of  By: Percival Spanish, MD, Bethann Goo 07/15/2021   Fibromyalgia    GERD (gastroesophageal reflux disease)    H/O echocardiogram    done /w [place] in 2011, saw Dr. Percival Spanish for tachycardia    Headache    History of colon cancer 03/17/2011   Hyperlipidemia    6 years   Hyperlipidemia, mixed    6 years   Hypertension    15 years, reports she has a rapid heartrate    Loss of weight 12/14/2013   Measles as a child   Osteoarthritis (arthritis due to wear and tear of joints) 03/17/2011   Noted diffusely including neck   Osteopenia 12/14/2013   Pain in finger of right hand 05/15/2017   Pain in joint, lower leg 09/03/2013   Pain of right shoulder region 07/07/2014   Peripheral neuropathy    SOB (shortness of breath) 05/31/2010   Qualifier: Diagnosis of  By: Percival Spanish, MD, Farrel Gordon     Stress  06/01/2013   Tachycardia 05/31/2010   Qualifier: Diagnosis of  By: Percival Spanish, MD, Farrel Gordon     Type 2 diabetes mellitus with peripheral neuropathy ([place]) 03/17/2011   Vitamin B 12 deficiency 07/07/2014   intrinsic factor positive   Past Surgical History:  Procedure Laterality Date   BREAST EXCISIONAL BIOPSY Right 2013   benign   BREAST SURGERY  2013   right- benign   CATARACT EXTRACTION     /w IOL- bilateral    COLON SURGERY     For resection of cancer   Nodule removed     From throat    Allergies  Allergies  Allergen Reactions   Tape Itching and Rash   Actos [Pioglitazone Hydrochloride] Other (See Comments)    Unknown reaction   Buspar [Buspirone Hcl] Other (See Comments)    Unknown reaction   Doxycycline Other (See Comments)    Made the patient "feel funny"- in a negative way   Lipitor [Atorvastatin Calcium] Other (See Comments)    "It made me hurt all over."   Penicillins Rash    History of Present Illness    Dana Burns is a PMH of type 2 diabetes, hypertension, diastolic CHF (EF [id number], G2 DD), atrial fibrillation with RVR (new diagnosis 01/08/2023 in the setting of acute hypoxic respiratory failure  due to RSV bronchitis), GERD, colon cancer, peripheral neuropathy, fibromyalgia, anxiety, depression, palpitations, and RLS.  She was admitted to the hospital on 01/09/2023 and discharged on 01/17/2023.  She presented with 1 week of nonproductive cough fever and chills.  She was found to have RSV with bronchitis and acute respiratory failure.  She required oxygen.  She went into atrial fibrillation with RVR 01/11/2023.  Cardiology was consulted.  She was started on amiodarone, metoprolol 12.5 mg, apixaban 2.5.  She presents to the clinic today for follow-up evaluation and states***  *** denies chest pain, shortness of breath, lower extremity edema, fatigue, palpitations, melena, hematuria, hemoptysis, diaphoresis, weakness, presyncope, syncope, orthopnea, and PND.  Atrial  fibrillation-EKG today shows*** CHA2DS2-VASc Score and unadjusted Ischemic Stroke Rate (% per year) is equal to 9.7 % stroke rate/year from a score of 6 [CHF, HTN, DM,  Female, Agex2].  Reports compliance with apixaban.  Denies bleeding issues.  Bleeding precautions reviewed. Continue metoprolol, apixaban Avoid triggers caffeine, chocolate, EtOH, dehydration etc.  Diastolic CHF-returning to her normal daily activities.  No increased DOE.  Euvolemic.  Weight stable. Continue metoprolol Heart healthy low-sodium diet-salty 6 given Increase physical activity as tolerated   Essential hypertension-BP today*** Continue metoprolol Maintain blood pressure log  Type 2 diabetes-glucose 100 on 01/17/2023 Heart healthy low-sodium carb modified diet Follows with PCP  Bronchitis-admitted with acute respiratory failure in the setting of bronchitis and RSV.  She was weaned off of oxygen and received supportive care. Follows with PCP  Disposition: Follow-up with Dr. [name] or APP in 3-4 months. Home Medications    Prior to Admission medications   Medication Sig Start Date End Date Taking? Authorizing Provider  acetaminophen (TYLENOL) 500 MG tablet Take 500 mg by mouth every 6 (six) hours as needed for pain.    [provider]  apixaban (ELIQUIS) 2.5 MG TABS tablet Take 1 tablet (2.5 mg total) by mouth 2 (two) times daily. 01/15/23   Hosie Poisson, MD  benzonatate (TESSALON) 200 MG capsule Take 1 capsule (200 mg total) by mouth 3 (three) times daily as needed for cough. 01/15/23   Hosie Poisson, MD  calcium carbonate (OS-CAL) 600 MG TABS Take 600 mg by mouth 2 (two) times daily with a meal.    [provider]  Cholecalciferol (VITAMIN D3) 50 MCG (2000 UT) capsule Take 2,000 Units by mouth daily.     [provider]  citalopram (CELEXA) 20 MG tablet TAKE 1 TABLET BY MOUTH EVERY DAY 01/09/23   Mosie Lukes, MD  Cyanocobalamin (B-12 IJ) Place 1,000 mcg under the tongue daily.     [provider]  diazepam (VALIUM) 5 MG tablet TAKE 1/2 TO 1 TABLET DAILY AS NEEDED FOR ANXIETY Patient taking differently: Take 2.5 mg by mouth daily. 11/20/22   Mosie Lukes, MD  famotidine (PEPCID) 40 MG tablet Take 1 tablet (40 mg total) by mouth at bedtime. 11/20/22   Mosie Lukes, MD  feeding supplement (ENSURE ENLIVE / ENSURE PLUS) LIQD Take 237 mLs by mouth 2 (two) times daily between meals. 01/16/23 04/16/23  Hosie Poisson, MD  gabapentin (NEURONTIN) 800 MG tablet Take 1 tablet (800 mg total) by mouth 3 (three) times daily. 11/20/22   Mosie Lukes, MD  guaiFENesin-dextromethorphan (ROBITUSSIN DM) 100-10 MG/5ML syrup Take 10 mLs by mouth every 4 (four) hours as needed for cough. 01/15/23   Hosie Poisson, MD  metoprolol tartrate (LOPRESSOR) 25 MG tablet Take 0.5 tablets (12.5 mg total) by mouth in the  morning, at noon, and at bedtime. 01/17/23   Nita Sells, MD  mometasone-formoterol (DULERA) 200-5 MCG/ACT AERO Inhale 2 puffs into the lungs 2 (two) times daily. 01/15/23   Hosie Poisson, MD  Multiple Vitamin (MULTIVITAMIN WITH MINERALS) TABS tablet Take 1 tablet by mouth daily. 01/16/23   Hosie Poisson, MD  ondansetron (ZOFRAN-ODT) 4 MG disintegrating tablet Take 1 tablet (4 mg total) by mouth every 8 (eight) hours as needed for nausea or vomiting. 12/03/22   Shelda Pal, DO  rOPINIRole (REQUIP) 1 MG tablet Take 1 tablet (1 mg total) by mouth at bedtime. 08/14/22   Mosie Lukes, MD  tiZANidine (ZANAFLEX) 4 MG tablet Take 1 tablet (4 mg total) by mouth 2 (two) times daily as needed for muscle spasms. 08/14/22   Mosie Lukes, MD  venlafaxine XR (EFFEXOR-XR) 37.5 MG 24 hr capsule Take 1 capsule (37.5 mg total) by mouth daily with breakfast. 08/14/22   Mosie Lukes, MD    Family History    Family History  Problem Relation Age of Onset   Cancer Mother        Brain   Cancer Father        Colorectal   Cancer Sister        breast   Other Brother         pacemaker   Cancer Brother        pancreatic cancer   Arthritis Sister    Emphysema Brother    Cancer Brother    COPD Brother    Neuropathy Daughter    Fibromyalgia Daughter    Cancer Brother        metastatic colon cancer   Heart disease Neg Hx        Early   She indicated that her mother is deceased. She indicated that her father is deceased. She indicated that three of her six sisters are alive. She indicated that two of her four brothers are alive. She indicated that her maternal grandmother is deceased. She indicated that her maternal grandfather is deceased. She indicated that her paternal grandmother is deceased. She indicated that her paternal grandfather is deceased. She indicated that her daughter is alive. She indicated that the status of her neg hx is unknown.  Social History    Social History   Socioeconomic History   Marital status: Widowed    Spouse name: Not on file   Number of children: 1   Years of education: 9   Highest education level: Not on file  Occupational History   Occupation: Retired  Tobacco Use   Smoking status: Never   Smokeless tobacco: Never  Substance and Sexual Activity   Alcohol use: No   Drug use: No   Sexual activity: Never    Comment: lives with daughter. no dietary restrictions  Other Topics Concern   Not on file  Social History Narrative   Patient lives at home with daughter.    Patient is retired.    Patient is widowed.    Patient has 1 child.    Patient has a 9th grade education.    Social Determinants of Health   Financial Resource Strain: Low Risk  (03/01/2022)   Overall Financial Resource Strain (CARDIA)    Difficulty of Paying Living Expenses: Not hard at all  Food Insecurity: No Food Insecurity (01/18/2023)   Hunger Vital Sign    Worried About Running Out of Food in the Last Year: Never true    Ran Out of  Food in the Last Year: Never true  Transportation Needs: No Transportation Needs (01/18/2023)   PRAPARE -  Hydrologist (Medical): No    Lack of Transportation (Non-Medical): No  Physical Activity: Inactive (03/01/2022)   Exercise Vital Sign    Days of Exercise per Week: 0 days    Minutes of Exercise per Session: 0 min  Stress: No Stress Concern Present (03/01/2022)   [place]    Feeling of Stress : Not at all  Social Connections: Not on file  Intimate Partner Violence: Not At Risk (01/09/2023)   Humiliation, Afraid, Rape, and Kick questionnaire    Fear of Current or Ex-Partner: No    Emotionally Abused: No    Physically Abused: No    Sexually Abused: No     Review of Systems    General:  No chills, fever, night sweats or weight changes.  Cardiovascular:  No chest pain, dyspnea on exertion, edema, orthopnea, palpitations, paroxysmal nocturnal dyspnea. Dermatological: No rash, lesions/masses Respiratory: No cough, dyspnea Urologic: No hematuria, dysuria Abdominal:   No nausea, vomiting, diarrhea, bright red blood per rectum, melena, or hematemesis Neurologic:  No visual changes, wkns, changes in mental status. All other systems reviewed and are otherwise negative except as noted above.  Physical Exam    VS:  There were no vitals taken for this visit. , BMI There is no height or weight on file to calculate BMI. GEN: Well nourished, well developed, in no acute distress. HEENT: normal. Neck: Supple, no JVD, carotid bruits, or masses. Cardiac: RRR, no murmurs, rubs, or gallops. No clubbing, cyanosis, edema.  Radials/DP/PT 2+ and equal bilaterally.  Respiratory:  Respirations regular and unlabored, clear to auscultation bilaterally. GI: Soft, nontender, nondistended, BS + x 4. MS: no deformity or atrophy. Skin: warm and dry, no rash. Neuro:  Strength and sensation are intact. Psych: Normal affect.  Accessory Clinical Findings    Recent Labs: 01/14/2023: Hemoglobin 14.1; Platelets 285; TSH  2.191 01/17/2023: ALT 19; BUN 28; Creatinine, Ser 1.18; Magnesium 1.8; Potassium 4.3; Sodium 137   Recent Lipid Panel    Component Value Date/Time   CHOL 265 (H) 11/20/2022 1615   CHOL 180 06/21/2014 1449   CHOL 148 05/29/2013 1131   TRIG 339.0 (H) 11/20/2022 1615   TRIG 117 03/16/2014 1708   TRIG 133 05/29/2013 1131   HDL 42.80 11/20/2022 1615   HDL 56 06/21/2014 1449   HDL 55 03/16/2014 1708   HDL 48 05/29/2013 1131   CHOLHDL 6 11/20/2022 1615   VLDL 67.8 (H) 11/20/2022 1615   LDLCALC 154 (H) 10/18/2021 1508   LDLCALC 118 (H) 08/22/2020 1321   LDLCALC 77 03/16/2014 1708   LDLCALC 73 05/29/2013 1131   LDLDIRECT 168.0 11/20/2022 1615    No BP recorded.  {Refresh Note OR Click here to enter BP  :1}***    ECG personally reviewed by me today- *** - No acute changes  Echocardiogram 01/14/2023  IMPRESSIONS     1. Left ventricular ejection fraction, by estimation, is 60 to 65%. The  left ventricle has normal function. The left ventricle has no regional  wall motion abnormalities. Left ventricular diastolic parameters are  indeterminate.   2. Right ventricular systolic function is normal. The right ventricular  size is normal. There is normal pulmonary artery systolic pressure.   3. Left atrial size was mildly dilated.   4. Right atrial size was mildly dilated.   5.  The mitral valve is normal in structure. No evidence of mitral valve  regurgitation. No evidence of mitral stenosis.   6. The aortic valve is tricuspid. There is mild calcification of the  aortic valve. There is mild thickening of the aortic valve. Aortic valve  regurgitation is trivial. Aortic valve sclerosis is present, with no  evidence of aortic valve stenosis.   7. The inferior vena cava is normal in size with greater than 50%  respiratory variability, suggesting right atrial pressure of 3 mmHg.   FINDINGS   Left Ventricle: Left ventricular ejection fraction, by estimation, is 60  to 65%. The left  ventricle has normal function. The left ventricle has no  regional wall motion abnormalities. The left ventricular internal cavity  size was normal in size. There is   no left ventricular hypertrophy. Left ventricular diastolic parameters  are indeterminate.   Right Ventricle: The right ventricular size is normal. No increase in  right ventricular wall thickness. Right ventricular systolic function is  normal. There is normal pulmonary artery systolic pressure. The tricuspid  regurgitant velocity is 2.54 m/s, and   with an assumed right atrial pressure of 3 mmHg, the estimated right  ventricular systolic pressure is 0000000 mmHg.   Left Atrium: Left atrial size was mildly dilated.   Right Atrium: Right atrial size was mildly dilated.   Pericardium: There is no evidence of pericardial effusion.   Mitral Valve: The mitral valve is normal in structure. No evidence of  mitral valve regurgitation. No evidence of mitral valve stenosis.   Tricuspid Valve: The tricuspid valve is normal in structure. Tricuspid  valve regurgitation is mild . No evidence of tricuspid stenosis.   Aortic Valve: The aortic valve is tricuspid. There is mild calcification  of the aortic valve. There is mild thickening of the aortic valve. Aortic  valve regurgitation is trivial. Aortic regurgitation PHT measures 401  msec. Aortic valve sclerosis is  present, with no evidence of aortic valve stenosis. Aortic valve mean  gradient measures 4.7 mmHg. Aortic valve peak gradient measures 8.3 mmHg.  Aortic valve area, by VTI measures 2.46 cm.   Pulmonic Valve: The pulmonic valve was normal in structure. Pulmonic valve  regurgitation is not visualized. No evidence of pulmonic stenosis.   Aorta: The aortic root is normal in size and structure.   Venous: The inferior vena cava is normal in size with greater than 50%  respiratory variability, suggesting right atrial pressure of 3 mmHg.   IAS/Shunts: No atrial level shunt  detected by color flow Doppler.     Assessment & Plan   1.  ***   Jossie Ng. Channie Bostick NP-C     02/14/2023, 7:26 AM [facility] [address] Office [phone] Fax [phone]    I spent***minutes examining this patient, reviewing medications, and using patient centered shared decision making involving her cardiac care.  Prior to her visit I spent greater than 20 minutes reviewing her past medical history,  medications, and prior cardiac tests.

## 2023-02-15 ENCOUNTER — Ambulatory Visit: Payer: Medicare Other | Admitting: General Practice

## 2023-02-16 ENCOUNTER — Other Ambulatory Visit: Payer: Self-pay | Admitting: Family Medicine

## 2023-02-17 ENCOUNTER — Other Ambulatory Visit: Payer: Self-pay | Admitting: Family Medicine

## 2023-02-20 ENCOUNTER — Telehealth: Payer: Self-pay | Admitting: Family Medicine

## 2023-02-20 NOTE — Telephone Encounter (Signed)
Contacted [name] to schedule their annual wellness visit. Appointment made for 03/04/2023.  [facility] Direct Dial: [phone]

## 2023-02-24 NOTE — Assessment & Plan Note (Signed)
Hydrate and monitor follows with [facility]

## 2023-02-24 NOTE — Assessment & Plan Note (Signed)
Well controlled, no changes to meds. Encouraged heart healthy diet such as the DASH diet and exercise as tolerated.  °

## 2023-02-24 NOTE — Assessment & Plan Note (Signed)
hgba1c acceptable, minimize simple carbs. Increase exercise as tolerated. Continue current meds 

## 2023-02-24 NOTE — Assessment & Plan Note (Signed)
Supplement and monitor 

## 2023-02-25 ENCOUNTER — Telehealth (INDEPENDENT_AMBULATORY_CARE_PROVIDER_SITE_OTHER): Payer: Medicare Other | Admitting: Family Medicine

## 2023-02-25 DIAGNOSIS — E1142 Type 2 diabetes mellitus with diabetic polyneuropathy: Secondary | ICD-10-CM

## 2023-02-25 DIAGNOSIS — N189 Chronic kidney disease, unspecified: Secondary | ICD-10-CM

## 2023-02-25 DIAGNOSIS — I10 Essential (primary) hypertension: Secondary | ICD-10-CM

## 2023-02-25 DIAGNOSIS — L03119 Cellulitis of unspecified part of limb: Secondary | ICD-10-CM

## 2023-02-25 DIAGNOSIS — R630 Anorexia: Secondary | ICD-10-CM

## 2023-02-25 DIAGNOSIS — E538 Deficiency of other specified B group vitamins: Secondary | ICD-10-CM | POA: Diagnosis not present

## 2023-02-25 DIAGNOSIS — Z85038 Personal history of other malignant neoplasm of large intestine: Secondary | ICD-10-CM

## 2023-02-25 DIAGNOSIS — N390 Urinary tract infection, site not specified: Secondary | ICD-10-CM | POA: Diagnosis not present

## 2023-02-25 DIAGNOSIS — R3911 Hesitancy of micturition: Secondary | ICD-10-CM

## 2023-02-25 DIAGNOSIS — W19XXXA Unspecified fall, initial encounter: Secondary | ICD-10-CM | POA: Diagnosis not present

## 2023-02-25 DIAGNOSIS — R634 Abnormal weight loss: Secondary | ICD-10-CM

## 2023-02-25 MED ORDER — CEFDINIR 300 MG PO CAPS: 300.0000 mg | ORAL_CAPSULE | Freq: Two times a day (BID) | ORAL | 0 refills | Status: AC

## 2023-02-26 ENCOUNTER — Telehealth: Payer: Self-pay

## 2023-02-26 NOTE — Telephone Encounter (Signed)
(  10:38 am) PC SW scheduled an initial visit for patient with her daughter. Visit is scheduled with the palliative clinical team for 02/28/23.

## 2023-02-26 NOTE — Telephone Encounter (Signed)
(  10:07 am) PC SW left a voice message for patient's daughter-[name] requesting a call back to discuss palliative care referral and schedule a visit.

## 2023-02-27 ENCOUNTER — Other Ambulatory Visit: Payer: Self-pay | Admitting: Family Medicine

## 2023-02-27 DIAGNOSIS — L0390 Cellulitis, unspecified: Secondary | ICD-10-CM | POA: Insufficient documentation

## 2023-02-27 NOTE — Progress Notes (Signed)
MyChart Video Visit    Virtual Visit via Video Note   This format is felt to be most appropriate for this patient at this time. Physical exam was limited by quality of the video and audio technology used for the visit. Shamaine, CMA was able to get the patient set up on a video visit.  Patient location: home with daughter Patient and provider in visit Provider location: Office  I discussed the limitations of evaluation and management by telemedicine and the availability of in person appointments. The patient expressed understanding and agreed to proceed.  Visit Date: 02/25/2023  Today's healthcare provider: Penni Homans, MD     Subjective:    Patient ID: Dana Burns, female    DOB: 01/27/1926, 89 y.o.   MRN: [id number]  Chief Complaint  Patient presents with   Follow-up    Follow up     HPI Patient is in today for follow up on chronic medical concerns. No recent febrile illness or hospitalizations. Denies CP/palp/SOB/HA/congestion/fevers. Taking meds as prescribed. She is struggling with muscle cramps at night. She struggling with increased fatigue and is not eating and drinking well. No chills or acute signs of acute illness other than some urinary hesitancy.   Past Medical History:  Diagnosis Date   Abdominal pain Oct 15, 2017   Allergy    sneeze, rhinorrhea in am   Anxiety state 09/03/2013   Breast mass, right 10/14/2012   Chicken pox as a child   Chronic pain syndrome 01/23/2015   Chronic renal insufficiency 03/17/2011   Colon cancer ([place])    Complication of anesthesia    agitated when waking up, wakes up shaking, "like  I'm going into shock"   Constipation Oct 15, 2017   Depression    husband died  3 months ago, pt. admits depression  currently    Diabetic neuropathy ([place]) 03/17/2011   DM (diabetes mellitus) type 2, uncontrolled, with ketoacidosis ([place]) 03/17/2011   Ductal hyperplasia of breast 03/17/2011   Essential hypertension, benign 05/31/2010   Qualifier:  Diagnosis of  By: Percival Spanish, MD, Bethann Goo 07/15/2021   Fibromyalgia    GERD (gastroesophageal reflux disease)    H/O echocardiogram    done /w [facility] in 2011, saw Dr. Percival Spanish for tachycardia    Headache    History of colon cancer 03/17/2011   Hyperlipidemia    6 years   Hyperlipidemia, mixed    6 years   Hypertension    15 years, reports she has a rapid heartrate    Loss of weight 12/14/2013   Measles as a child   Osteoarthritis (arthritis due to wear and tear of joints) 03/17/2011   Noted diffusely including neck   Osteopenia 12/14/2013   Pain in finger of right hand 05/15/2017   Pain in joint, lower leg 09/03/2013   Pain of right shoulder region 07/07/2014   Peripheral neuropathy    SOB (shortness of breath) 05/31/2010   Qualifier: Diagnosis of  By: Percival Spanish, MD, Farrel Gordon     Stress 06/01/2013   Tachycardia 05/31/2010   Qualifier: Diagnosis of  By: Percival Spanish, MD, Farrel Gordon     Type 2 diabetes mellitus with peripheral neuropathy ([place]) 03/17/2011   Vitamin B 12 deficiency 07/07/2014   intrinsic factor positive    Past Surgical History:  Procedure Laterality Date   BREAST EXCISIONAL BIOPSY Right 2013   benign   BREAST SURGERY  2013   right- benign   CATARACT EXTRACTION     /  w IOL- bilateral    COLON SURGERY     For resection of cancer   Nodule removed     From throat    Family History  Problem Relation Age of Onset   Cancer Mother        Brain   Cancer Father        Colorectal   Cancer Sister        breast   Other Brother        pacemaker   Cancer Brother        pancreatic cancer   Arthritis Sister    Emphysema Brother    Cancer Brother    COPD Brother    Neuropathy Daughter    Fibromyalgia Daughter    Cancer Brother        metastatic colon cancer   Heart disease Neg Hx        Early    Social History   Socioeconomic History   Marital status: Widowed    Spouse name: Not on file   Number of children: 1   Years of education: 9   Highest  education level: Not on file  Occupational History   Occupation: Retired  Tobacco Use   Smoking status: Never   Smokeless tobacco: Never  Substance and Sexual Activity   Alcohol use: No   Drug use: No   Sexual activity: Never    Comment: lives with daughter. no dietary restrictions  Other Topics Concern   Not on file  Social History Narrative   Patient lives at home with daughter.    Patient is retired.    Patient is widowed.    Patient has 1 child.    Patient has a 9th grade education.    Social Determinants of Health   Financial Resource Strain: Low Risk  (03/01/2022)   Overall Financial Resource Strain (CARDIA)    Difficulty of Paying Living Expenses: Not hard at all  Food Insecurity: No Food Insecurity (01/18/2023)   Hunger Vital Sign    Worried About Running Out of Food in the Last Year: Never true    Ran Out of Food in the Last Year: Never true  Transportation Needs: No Transportation Needs (01/18/2023)   PRAPARE - Hydrologist (Medical): No    Lack of Transportation (Non-Medical): No  Physical Activity: Inactive (03/01/2022)   Exercise Vital Sign    Days of Exercise per Week: 0 days    Minutes of Exercise per Session: 0 min  Stress: No Stress Concern Present (03/01/2022)   [place]    Feeling of Stress : Not at all  Social Connections: Not on file  Intimate Partner Violence: Not At Risk (01/09/2023)   Humiliation, Afraid, Rape, and Kick questionnaire    Fear of Current or Ex-Partner: No    Emotionally Abused: No    Physically Abused: No    Sexually Abused: No    Outpatient Medications Prior to Visit  Medication Sig Dispense Refill   acetaminophen (TYLENOL) 500 MG tablet Take 500 mg by mouth every 6 (six) hours as needed for pain.     apixaban (ELIQUIS) 2.5 MG TABS tablet Take 1 tablet (2.5 mg total) by mouth 2 (two) times daily. 60 tablet 3   benzonatate (TESSALON) 200 MG  capsule Take 1 capsule (200 mg total) by mouth 3 (three) times daily as needed for cough. 20 capsule 0   calcium carbonate (OS-CAL) 600 MG  TABS Take 600 mg by mouth 2 (two) times daily with a meal.     Cholecalciferol (VITAMIN D3) 50 MCG (2000 UT) capsule Take 2,000 Units by mouth daily.      citalopram (CELEXA) 20 MG tablet TAKE 1 TABLET BY MOUTH EVERY DAY 90 tablet 1   Cyanocobalamin (B-12 IJ) Place 1,000 mcg under the tongue daily.     diazepam (VALIUM) 5 MG tablet TAKE 1/2 TO 1 TABLET DAILY AS NEEDED FOR ANXIETY (Patient taking differently: Take 2.5 mg by mouth daily.) 30 tablet 1   famotidine (PEPCID) 40 MG tablet TAKE 1 TABLET BY MOUTH EVERYDAY AT BEDTIME 90 tablet 1   feeding supplement (ENSURE ENLIVE / ENSURE PLUS) LIQD Take 237 mLs by mouth 2 (two) times daily between meals. 14220 mL 2   gabapentin (NEURONTIN) 800 MG tablet Take 1 tablet (800 mg total) by mouth 3 (three) times daily. 270 tablet 1   guaiFENesin-dextromethorphan (ROBITUSSIN DM) 100-10 MG/5ML syrup Take 10 mLs by mouth every 4 (four) hours as needed for cough. 118 mL 0   metoprolol tartrate (LOPRESSOR) 25 MG tablet Take 0.5 tablets (12.5 mg total) by mouth in the morning, at noon, and at bedtime. 600 tablet 0   mometasone-formoterol (DULERA) 200-5 MCG/ACT AERO Inhale 2 puffs into the lungs 2 (two) times daily. 1 each 2   Multiple Vitamin (MULTIVITAMIN WITH MINERALS) TABS tablet Take 1 tablet by mouth daily.     ondansetron (ZOFRAN-ODT) 4 MG disintegrating tablet Take 1 tablet (4 mg total) by mouth every 8 (eight) hours as needed for nausea or vomiting. 20 tablet 0   rOPINIRole (REQUIP) 1 MG tablet TAKE 1 TABLET BY MOUTH AT BEDTIME. 90 tablet 1   tiZANidine (ZANAFLEX) 4 MG tablet Take 1 tablet (4 mg total) by mouth 2 (two) times daily as needed for muscle spasms. 180 tablet 1   venlafaxine XR (EFFEXOR-XR) 37.5 MG 24 hr capsule Take 1 capsule (37.5 mg total) by mouth daily with breakfast. 90 capsule 1   No facility-administered  medications prior to visit.    Allergies  Allergen Reactions   Tape Itching and Rash   Actos [Pioglitazone Hydrochloride] Other (See Comments)    Unknown reaction   Buspar [Buspirone Hcl] Other (See Comments)    Unknown reaction   Doxycycline Other (See Comments)    Made the patient "feel funny"- in a negative way   Lipitor [Atorvastatin Calcium] Other (See Comments)    "It made me hurt all over."   Penicillins Rash    Review of Systems  Constitutional:  Negative for fever and malaise/fatigue.  HENT:  Negative for congestion.   Eyes:  Negative for blurred vision.  Respiratory:  Negative for shortness of breath.   Cardiovascular:  Negative for chest pain, palpitations and leg swelling.  Gastrointestinal:  Negative for abdominal pain, blood in stool and nausea.  Genitourinary:  Negative for dysuria and frequency.  Musculoskeletal:  Positive for myalgias. Negative for falls.  Skin:  Negative for rash.  Neurological:  Negative for dizziness, loss of consciousness and headaches.  Endo/Heme/Allergies:  Negative for environmental allergies.  Psychiatric/Behavioral:  Negative for depression. The patient is not nervous/anxious.        Objective:    Physical Exam Constitutional:      General: She is not in acute distress.    Appearance: Normal appearance. She is not ill-appearing or toxic-appearing.  HENT:     Head: Normocephalic and atraumatic.     Right Ear: External ear normal.  Left Ear: External ear normal.     Nose: Nose normal.  Eyes:     General:        Right eye: No discharge.        Left eye: No discharge.  Pulmonary:     Effort: Pulmonary effort is normal.  Skin:    Findings: No rash.  Neurological:     Mental Status: She is alert and oriented to person, place, and time.  Psychiatric:        Behavior: Behavior normal.     There were no vitals taken for this visit. Wt Readings from Last 3 Encounters:  01/11/23 132 lb (59.9 kg)  11/20/22 140 lb (63.5  kg)  08/14/22 136 lb 9.6 oz (62 kg)       Assessment & Plan:  Chronic renal impairment, unspecified CKD stage Assessment & Plan: Hydrate and monitor follows with [facility]   Essential hypertension, benign Assessment & Plan: Well controlled, no changes to meds. Encouraged heart healthy diet such as the DASH diet and exercise as tolerated.    Orders: -     Lipid panel; Future -     CBC with Differential/Platelet; Future -     Comprehensive metabolic panel; Future -     TSH; Future  Type 2 diabetes mellitus with peripheral neuropathy (HCC) Assessment & Plan: hgba1c acceptable, minimize simple carbs. Increase exercise as tolerated. Continue current meds   Orders: -     Hemoglobin A1c; Future  Vitamin B 12 deficiency Assessment & Plan: Supplement and monitor   Orders: -     Vitamin B12; Future  Weight loss -     Amb Referral to Palliative Care  Anorexia -     Amb Referral to Palliative Care  Fall, initial encounter -     Amb Referral to Palliative Care  History of colon cancer -     Amb Referral to Palliative Care  Urinary tract infection without hematuria, site unspecified -     Urine Culture; Future -     POCT UA - Glucose/Protein  Cellulitis of lower extremity, unspecified laterality Assessment & Plan: Likely infected insect bite, started on Cefdinir.   Urinary hesitancy Assessment & Plan: Check UA and culture   Other orders -     Cefdinir; Take 1 capsule (300 mg total) by mouth 2 (two) times daily for 10 days.  Dispense: 10 capsule; Refill: 0     I discussed the assessment and treatment plan with the patient. The patient was provided an opportunity to ask questions and all were answered. The patient agreed with the plan and demonstrated an understanding of the instructions.   The patient was advised to call back or seek an in-person evaluation if the symptoms worsen or if the condition fails to improve as anticipated.  Penni Homans, MD [facility] [place] (phone) [phone] (fax)  [place]

## 2023-02-27 NOTE — Assessment & Plan Note (Signed)
Check UA and culture 

## 2023-02-27 NOTE — Assessment & Plan Note (Signed)
Likely infected insect bite, started on Cefdinir.

## 2023-02-28 ENCOUNTER — Other Ambulatory Visit: Payer: Medicare Other

## 2023-02-28 ENCOUNTER — Telehealth: Payer: Self-pay | Admitting: *Deleted

## 2023-02-28 DIAGNOSIS — Z515 Encounter for palliative care: Secondary | ICD-10-CM

## 2023-02-28 NOTE — Telephone Encounter (Signed)
Pt has lab appointment on 03/06/23. Per video visit on 02/25/23, pt was to return for several labs including urine culture and POCT UA -- glucose/protein.  Please clarify if this is the correct UA.  We usually do the POCT urinalysis dipstick (automated)-- EPIC order code [id number].  Do you want [place] to change previous order to the usual automated dipstick done in our office?

## 2023-02-28 NOTE — Progress Notes (Unsigned)
PATIENT NAME: Dana Burns DOB: [birth year] MRN: [id number]  PRIMARY CARE PROVIDER: Mosie Lukes, MD  RESPONSIBLE PARTY:  Acct ID - Guarantor Home Phone Work Phone Relationship Acct Type  [id number] Dana Burns, CAMPION* [phone]  Self P/F     [address], [place], [place] [address]   Palliative Care Initial Encounter Note   Home visit. Daughter Dana Burns also present.    Patient is in bed and says she isn't up for a visit today. She states "I may have something like the flu, I don't know." Dana Burns asks pt to "let the nurse come into your room and see you since she's here. She can look at you." The pt refused and asked Dana Burns to "have her come back another time". Daughter reports this is unusual for the pt as she is normally ut of bed for most of the day.     PHYSICAL EXAM:   VITALS:There were no vitals filed for this visit.         Tyana Butzer Georgann Housekeeper, LPN

## 2023-03-01 NOTE — Addendum Note (Signed)
Addended by: Kelle Darting A on: 03/01/2023 08:02 AM   Modules accepted: Orders

## 2023-03-01 NOTE — Telephone Encounter (Signed)
Order updated

## 2023-03-06 ENCOUNTER — Other Ambulatory Visit: Payer: Medicare Other

## 2023-03-13 ENCOUNTER — Other Ambulatory Visit: Payer: Medicare Other

## 2023-03-13 ENCOUNTER — Emergency Department (HOSPITAL_COMMUNITY)
Admission: EM | Admit: 2023-03-13 | Discharge: 2023-03-13 | Disposition: A | Discharge status: Home / Self Care | Payer: Medicare Other | Attending: Emergency Medicine | Admitting: Emergency Medicine

## 2023-03-13 ENCOUNTER — Emergency Department (HOSPITAL_COMMUNITY): Payer: Medicare Other

## 2023-03-13 VITALS — BP 90/58 | HR 74 | Temp 97.9°F | Resp 18

## 2023-03-13 DIAGNOSIS — Z79899 Other long term (current) drug therapy: Secondary | ICD-10-CM | POA: Diagnosis not present

## 2023-03-13 DIAGNOSIS — I4891 Unspecified atrial fibrillation: Secondary | ICD-10-CM | POA: Diagnosis not present

## 2023-03-13 DIAGNOSIS — R000 Tachycardia, unspecified: Secondary | ICD-10-CM | POA: Diagnosis not present

## 2023-03-13 DIAGNOSIS — R638 Other symptoms and signs concerning food and fluid intake: Secondary | ICD-10-CM | POA: Diagnosis not present

## 2023-03-13 DIAGNOSIS — R531 Weakness: Secondary | ICD-10-CM | POA: Diagnosis not present

## 2023-03-13 DIAGNOSIS — Z7901 Long term (current) use of anticoagulants: Secondary | ICD-10-CM | POA: Insufficient documentation

## 2023-03-13 DIAGNOSIS — I499 Cardiac arrhythmia, unspecified: Secondary | ICD-10-CM | POA: Diagnosis not present

## 2023-03-13 DIAGNOSIS — Z515 Encounter for palliative care: Secondary | ICD-10-CM

## 2023-03-13 DIAGNOSIS — R6889 Other general symptoms and signs: Secondary | ICD-10-CM | POA: Diagnosis not present

## 2023-03-13 DIAGNOSIS — Z743 Need for continuous supervision: Secondary | ICD-10-CM | POA: Diagnosis not present

## 2023-03-13 LAB — CBC WITH DIFFERENTIAL/PLATELET
Abs Immature Granulocytes: 0.01 10*3/uL (ref 0.00–0.07)
Basophils Absolute: 0 10*3/uL (ref 0.0–0.1)
Basophils Relative: 0 %
Eosinophils Absolute: 0 10*3/uL (ref 0.0–0.5)
Eosinophils Relative: 1 %
HCT: 42.9 % (ref 36.0–46.0)
Hemoglobin: 13.7 g/dL (ref 12.0–15.0)
Immature Granulocytes: 0 %
Lymphocytes Relative: 21 %
Lymphs Abs: 1 10*3/uL (ref 0.7–4.0)
MCH: 28.1 pg (ref 26.0–34.0)
MCHC: 31.9 g/dL (ref 30.0–36.0)
MCV: 88.1 fL (ref 80.0–100.0)
Monocytes Absolute: 0.4 10*3/uL (ref 0.1–1.0)
Monocytes Relative: 9 %
Neutro Abs: 3.2 10*3/uL (ref 1.7–7.7)
Neutrophils Relative %: 69 %
Platelets: 198 10*3/uL (ref 150–400)
RBC: 4.87 MIL/uL (ref 3.87–5.11)
RDW: 15.9 % — ABNORMAL HIGH (ref 11.5–15.5)
WBC: 4.7 10*3/uL (ref 4.0–10.5)
nRBC: 0 % (ref 0.0–0.2)

## 2023-03-13 LAB — COMPREHENSIVE METABOLIC PANEL
ALT: 13 U/L (ref 0–44)
AST: 22 U/L (ref 15–41)
Albumin: 3.3 g/dL — ABNORMAL LOW (ref 3.5–5.0)
Alkaline Phosphatase: 41 U/L (ref 38–126)
Anion gap: 10 (ref 5–15)
BUN: 14 mg/dL (ref 8–23)
CO2: 22 mmol/L (ref 22–32)
Calcium: 9.3 mg/dL (ref 8.9–10.3)
Chloride: 106 mmol/L (ref 98–111)
Creatinine, Ser: 1.19 mg/dL — ABNORMAL HIGH (ref 0.44–1.00)
GFR, Estimated: 42 mL/min — ABNORMAL LOW (ref >60)
Glucose, Bld: 109 mg/dL — ABNORMAL HIGH (ref 70–99)
Potassium: 4.4 mmol/L (ref 3.5–5.1)
Sodium: 138 mmol/L (ref 135–145)
Total Bilirubin: 0.3 mg/dL (ref 0.3–1.2)
Total Protein: 5.8 g/dL — ABNORMAL LOW (ref 6.5–8.1)

## 2023-03-13 LAB — URINALYSIS, ROUTINE W REFLEX MICROSCOPIC
Bilirubin Urine: NEGATIVE
Glucose, UA: NEGATIVE mg/dL
Hgb urine dipstick: NEGATIVE
Ketones, ur: 5 mg/dL — AB
Nitrite: NEGATIVE
Protein, ur: 30 mg/dL — AB
Specific Gravity, Urine: 1.02 (ref 1.005–1.030)
pH: 5 (ref 5.0–8.0)

## 2023-03-13 LAB — TROPONIN I (HIGH SENSITIVITY)
Troponin I (High Sensitivity): 9 ng/L (ref <18)
Troponin I (High Sensitivity): 8 ng/L (ref <18)

## 2023-03-13 MED ORDER — DIAZEPAM 2 MG PO TABS
2.0000 mg | ORAL_TABLET | Freq: Once | ORAL | Status: AC
  Administered 2023-03-13: 2 mg via ORAL
  Filled 2023-03-13: qty 1

## 2023-03-13 MED ORDER — LABETALOL HCL 5 MG/ML IV SOLN: 10.0000 mg | Freq: Once | INTRAVENOUS | Status: DC

## 2023-03-13 MED ORDER — LABETALOL HCL 5 MG/ML IV SOLN
5.0000 mg | Freq: Once | INTRAVENOUS | Status: AC
  Administered 2023-03-13: 5 mg via INTRAVENOUS
  Filled 2023-03-13: qty 4

## 2023-03-13 NOTE — ED Triage Notes (Signed)
Pt BIB [facility] for weakness x2 weeks. Patient incidentally found to be in afib RVR HR 90-140s on the monitor. Non compliant with medications d/t anxiety. Patient ambulated to stretcher with EMS and pressure after ambulating was 96/48, 135/81 was most recent. Patient A&Ox3.

## 2023-03-13 NOTE — ED Provider Notes (Signed)
[facility] Provider Note   CSN: [id number] Arrival date & time: 03/13/23  1645     History  Chief Complaint  Patient presents with   Weakness    Dana Burns is a 89 y.o. female.  HPI   89 year old female with past medical history of atrial fibrillation, prescribed anticoagulation presents to the emergency department with concern for generalized weakness.  Patient lives at home with her daughter.  It is reported that for the past couple weeks she has been significantly weak.  She is intermittently compliant with medications but they believe she takes her blood thinning medicine daily.  Patient admits to decreased appetite but denies any specific complaints including fever, headache, chest pain, cough, vomiting/diarrhea.  No genitourinary symptoms.  Home Medications Prior to Admission medications   Medication Sig Start Date End Date Taking? Authorizing Provider  acetaminophen (TYLENOL) 500 MG tablet Take 500 mg by mouth every 6 (six) hours as needed for pain.    [provider]  apixaban (ELIQUIS) 2.5 MG TABS tablet Take 1 tablet (2.5 mg total) by mouth 2 (two) times daily. 01/15/23   Hosie Poisson, MD  benzonatate (TESSALON) 200 MG capsule Take 1 capsule (200 mg total) by mouth 3 (three) times daily as needed for cough. 01/15/23   Hosie Poisson, MD  calcium carbonate (OS-CAL) 600 MG TABS Take 600 mg by mouth 2 (two) times daily with a meal.    [provider]  Cholecalciferol (VITAMIN D3) 50 MCG (2000 UT) capsule Take 2,000 Units by mouth daily.     [provider]  citalopram (CELEXA) 20 MG tablet TAKE 1 TABLET BY MOUTH EVERY DAY 01/09/23   Mosie Lukes, MD  Cyanocobalamin (B-12 IJ) Place 1,000 mcg under the tongue daily.    [provider]  diazepam (VALIUM) 5 MG tablet TAKE 1/2 TO 1 TABLET DAILY AS NEEDED FOR ANXIETY Patient taking differently: Take 2.5 mg by mouth daily. 11/20/22   Mosie Lukes,  MD  famotidine (PEPCID) 40 MG tablet TAKE 1 TABLET BY MOUTH EVERYDAY AT BEDTIME 02/18/23   Mosie Lukes, MD  feeding supplement (ENSURE ENLIVE / ENSURE PLUS) LIQD Take 237 mLs by mouth 2 (two) times daily between meals. 01/16/23 04/16/23  Hosie Poisson, MD  gabapentin (NEURONTIN) 800 MG tablet Take 1 tablet (800 mg total) by mouth 3 (three) times daily. 11/20/22   Mosie Lukes, MD  guaiFENesin-dextromethorphan (ROBITUSSIN DM) 100-10 MG/5ML syrup Take 10 mLs by mouth every 4 (four) hours as needed for cough. 01/15/23   Hosie Poisson, MD  metoprolol tartrate (LOPRESSOR) 25 MG tablet Take 0.5 tablets (12.5 mg total) by mouth in the morning, at noon, and at bedtime. 01/17/23   Nita Sells, MD  mometasone-formoterol (DULERA) 200-5 MCG/ACT AERO Inhale 2 puffs into the lungs 2 (two) times daily. 01/15/23   Hosie Poisson, MD  Multiple Vitamin (MULTIVITAMIN WITH MINERALS) TABS tablet Take 1 tablet by mouth daily. 01/16/23   Hosie Poisson, MD  ondansetron (ZOFRAN-ODT) 4 MG disintegrating tablet Take 1 tablet (4 mg total) by mouth every 8 (eight) hours as needed for nausea or vomiting. 12/03/22   Shelda Pal, DO  rOPINIRole (REQUIP) 1 MG tablet TAKE 1 TABLET BY MOUTH AT BEDTIME. 02/18/23   Mosie Lukes, MD  tiZANidine (ZANAFLEX) 4 MG tablet Take 1 tablet (4 mg total) by mouth 2 (two) times daily as needed for muscle spasms. 08/14/22   Mosie Lukes, MD  venlafaxine  XR (EFFEXOR-XR) 37.5 MG 24 hr capsule TAKE 1 CAPSULE BY MOUTH DAILY WITH BREAKFAST. 02/27/23   Mosie Lukes, MD      Allergies    Tape, Actos [pioglitazone hydrochloride], Buspar [buspirone hcl], Doxycycline, Lipitor [atorvastatin calcium], and Penicillins    Review of Systems   Review of Systems  Constitutional:  Positive for fatigue. Negative for fever.  Respiratory:  Negative for shortness of breath.   Cardiovascular:  Negative for chest pain.  Gastrointestinal:  Negative for abdominal pain, diarrhea and vomiting.   Genitourinary:  Negative for dysuria and frequency.  Musculoskeletal:  Negative for back pain.  Skin:  Negative for rash.  Neurological:  Negative for facial asymmetry and headaches.    Physical Exam Updated Vital Signs BP 107/75   Pulse 79   Resp (!) 26   SpO2 100%  Physical Exam Vitals and nursing note reviewed.  Constitutional:      General: She is not in acute distress.    Appearance: Normal appearance.  HENT:     Head: Normocephalic.     Mouth/Throat:     Mouth: Mucous membranes are moist.  Eyes:     Extraocular Movements: Extraocular movements intact.     Pupils: Pupils are equal, round, and reactive to light.  Cardiovascular:     Rate and Rhythm: Normal rate.  Pulmonary:     Effort: Pulmonary effort is normal. No respiratory distress.  Abdominal:     Palpations: Abdomen is soft.     Tenderness: There is no abdominal tenderness.  Skin:    General: Skin is warm.  Neurological:     General: No focal deficit present.     Mental Status: She is alert and oriented to person, place, and time. Mental status is at baseline.  Psychiatric:        Mood and Affect: Mood normal.     ED Results / Procedures / Treatments   Labs (all labs ordered are listed, but only abnormal results are displayed) Labs Reviewed  CBC WITH DIFFERENTIAL/PLATELET  COMPREHENSIVE METABOLIC PANEL  URINALYSIS, ROUTINE W REFLEX MICROSCOPIC  TROPONIN I (HIGH SENSITIVITY)    EKG None  Radiology No results found.  Procedures Procedures    Medications Ordered in ED Medications - No data to display  ED Course/ Medical Decision Making/ A&P                             Medical Decision Making Amount and/or Complexity of Data Reviewed Labs: ordered. Radiology: ordered.  Risk Prescription drug management.   89 year old female presents emergency department accompanied by grandson for concern of weakness.  She has had decreased oral intake and is refusing to go to the doctor.  The  patient is complaining of generalized weakness but has no specific complaints to me.  She resides with her daughter who reportedly helps her with medications.  Reported compliance with her atrial fibrillation medications.  EKG shows atrial fibrillation which is baseline for the patient.  Workup is otherwise baseline for her with no acute changes.  Patient had period of tachycardia that was most likely from her missed afternoon and evening beta-blocker dose.  Patient was given medications here and heart rate is controlled and appropriate. She has been able to PO.  No indication for emergent admission.  Yolanda Bonine is inquiring about the possibility of admission however that is not appropriate at this time.  I offered evaluation for possible rehab placement and the patient  refuses.  She is requesting to be discharged home, is otherwise oriented per her baseline.  Patient at this time appears safe and stable for discharge and close outpatient follow up. Discharge plan and strict return to ED precautions discussed, patient verbalizes understanding and agreement.        Final Clinical Impression(s) / ED Diagnoses Final diagnoses:  None    Rx / DC Orders ED Discharge Orders     None         Lorelle Gibbs, DO 03/13/23 2315

## 2023-03-13 NOTE — Progress Notes (Signed)
PATIENT NAME: Dana Burns DOB: Aug 28, 1926 MRN: [id number]  PRIMARY CARE PROVIDER: Mosie Lukes, MD  RESPONSIBLE PARTY:  Acct ID - Guarantor Home Phone Work Phone Relationship Acct Type  [id number] [name], [name] [phone]  Self P/F     [address], [place], [place] [address]    Palliative Care Follow Up Encounter Note   Completed home visit.  Daughter [name] also present.     HISTORY OF PRESENT ILLNESS:    Pt has not felt well since having RSV in Jan 2024. Encouraged pt to call 911 but she refused stating "all I need is some coffee and crackers and I'll feel better" LPN called Dr Frederik Pear office and spoke to [employer] Occupational psychologist). We discussed how patient felt and her recommendation was to have pt call EMS. Gave patient a choice to call EMS or I would send in a hospice referral so she would have weekly nurse visits and Maudie Mercury felt that was an appropriate option. Also spoke to pt's grandson via phone. Pt still refused so this LPN made an appt to see the pt on Friday, April 4th at 1pm  Respiratory: SOB on occasion; denies SOB for today  Cardiac: no issues; no edema; irregular heartbeat  Cognitive: alert and oriented; no confusion noted   Appetite: nausea; 1.5-2 cups of fluid daily; no eating very much as per daughter  GI/GU: dtr reports dark urine; last BM yesterday; has a BM every few days   Mobility:  walks with a cane without additional assistance; unsure of how well she will ambulate today  ADLs: dependent   Sleeping Pattern: pt reports she doesn't sleep well at night; dozes in the day time  Pain: reports headache and leg ache (baseline); dtr gives Tylenol which works sometimes as per pt  Skin: very pale;   Palliative Care/ Hospice: RN explained role and purpose of palliative care including visit frequency. Also discussed benefits of hospice care as well as the differences between the two with patient.   Goals of Care: To stay in the home and not go to the hospital    CODE STATUS: Full Code ADVANCED DIRECTIVES: N MOST FORM: N PPS: 50%   PHYSICAL EXAM:   VITALS: Today's Vitals   03/13/23 1357  BP: (!) 90/58  Pulse: 74  Resp: 18  Temp: 97.9 F (36.6 C)  TempSrc: Temporal  SpO2: 94%  PainSc: 7   PainLoc: Head    LUNGS: clear to auscultation  CARDIAC: Cor irreg, irreg RRR EXTREMITIES: no edema but cool to touch SKIN: pale, cool NEURO: negative       Clinton Wahlberg Georgann Housekeeper, LPN

## 2023-03-13 NOTE — ED Notes (Signed)
Patient family member approached RN requesting the doctor speak with him about keeping his grandmother in the hospital. RN informed provider of the patient's request. ED Provider spoke with patient and family member and explained the reason for discharge. Patient continues to approach staff at the nurses station requesting to speak with the charge nurse, who has spoken with him, and the department director who is not available. Registration staff provided family with the contact information for the department director.

## 2023-03-13 NOTE — Discharge Instructions (Signed)
You have been seen and discharged from the emergency department.  Your workup today was baseline for you.  Continue to take your medications as prescribed.  Stay well-hydrated.  Follow-up with your primary provider for further evaluation and further care. Take home medications as prescribed. If you have any worsening symptoms or further concerns for your health please return to an emergency department for further evaluation.

## 2023-03-21 ENCOUNTER — Ambulatory Visit: Payer: Medicare Other | Attending: Cardiovascular Disease | Admitting: Nurse Practitioner

## 2023-03-21 ENCOUNTER — Encounter: Payer: Self-pay | Admitting: Nurse Practitioner

## 2023-03-21 VITALS — BP 96/62 | HR 95 | Ht 66.0 in | Wt 127.8 lb

## 2023-03-21 DIAGNOSIS — E1142 Type 2 diabetes mellitus with diabetic polyneuropathy: Secondary | ICD-10-CM

## 2023-03-21 DIAGNOSIS — N1832 Chronic kidney disease, stage 3b: Secondary | ICD-10-CM

## 2023-03-21 DIAGNOSIS — I4819 Other persistent atrial fibrillation: Secondary | ICD-10-CM

## 2023-03-21 DIAGNOSIS — I5032 Chronic diastolic (congestive) heart failure: Secondary | ICD-10-CM | POA: Diagnosis not present

## 2023-03-21 DIAGNOSIS — I10 Essential (primary) hypertension: Secondary | ICD-10-CM | POA: Diagnosis not present

## 2023-03-21 MED ORDER — METOPROLOL TARTRATE 25 MG PO TABS: ORAL_TABLET | ORAL | 3 refills | Status: DC

## 2023-03-21 NOTE — Progress Notes (Addendum)
Office Visit    Patient Name: Dana Burns Date of Encounter: 03/21/2023  Primary Care Provider:  Mosie Lukes, MD Primary Cardiologist:  Jenean Lindau, MD  Chief Complaint    89 year old female with a history of persistent atrial fibrillation, chronic diastolic heart failure, hypertension, hyperlipidemia, chronic renal insufficiency, and type 2 diabetes who presents for hospital follow-up related to atrial fibrillation.  Past Medical History    Past Medical History:  Diagnosis Date   Abdominal pain 2017-10-26   Allergy    sneeze, rhinorrhea in am   Anxiety state 09/03/2013   Breast mass, right 10/14/2012   Chicken pox as a child   Chronic pain syndrome 01/23/2015   Chronic renal insufficiency 03/17/2011   Colon cancer ([place])    Complication of anesthesia    agitated when waking up, wakes up shaking, "like  I'm going into shock"   Constipation 10-26-2017   Depression    husband died  3 months ago, pt. admits depression  currently    Diabetic neuropathy ([place]) 03/17/2011   DM (diabetes mellitus) type 2, uncontrolled, with ketoacidosis ([place]) 03/17/2011   Ductal hyperplasia of breast 03/17/2011   Essential hypertension, benign 05/31/2010   Qualifier: Diagnosis of  By: Percival Spanish, MD, Bethann Goo 07/15/2021   Fibromyalgia    GERD (gastroesophageal reflux disease)    H/O echocardiogram    done /w [place] in 2011, saw Dr. Percival Spanish for tachycardia    Headache    History of colon cancer 03/17/2011   Hyperlipidemia    6 years   Hyperlipidemia, mixed    6 years   Hypertension    15 years, reports she has a rapid heartrate    Loss of weight 12/14/2013   Measles as a child   Osteoarthritis (arthritis due to wear and tear of joints) 03/17/2011   Noted diffusely including neck   Osteopenia 12/14/2013   Pain in finger of right hand 05/15/2017   Pain in joint, lower leg 09/03/2013   Pain of right shoulder region 07/07/2014   Peripheral neuropathy    SOB (shortness of  breath) 05/31/2010   Qualifier: Diagnosis of  By: Percival Spanish, MD, Farrel Gordon     Stress 06/01/2013   Tachycardia 05/31/2010   Qualifier: Diagnosis of  By: Percival Spanish, MD, Farrel Gordon     Type 2 diabetes mellitus with peripheral neuropathy ([place]) 03/17/2011   Vitamin B 12 deficiency 07/07/2014   intrinsic factor positive   Past Surgical History:  Procedure Laterality Date   BREAST EXCISIONAL BIOPSY Right 2013   benign   BREAST SURGERY  2013   right- benign   CATARACT EXTRACTION     /w IOL- bilateral    COLON SURGERY     For resection of cancer   Nodule removed     From throat    Allergies  Allergies  Allergen Reactions   Tape Itching and Rash   Actos [Pioglitazone Hydrochloride] Other (See Comments)    Unknown reaction   Buspar [Buspirone Hcl] Other (See Comments)    Unknown reaction   Doxycycline Other (See Comments)    Made the patient "feel funny"- in a negative way   Lipitor [Atorvastatin Calcium] Other (See Comments)    "It made me hurt all over."   Penicillins Rash     Labs/Other Studies Reviewed    The following studies were reviewed today: Echo 12/2022: IMPRESSIONS    1. Left ventricular ejection fraction, by estimation, is 60 to 65%.  The  left ventricle has normal function. The left ventricle has no regional  wall motion abnormalities. Left ventricular diastolic parameters are  indeterminate.   2. Right ventricular systolic function is normal. The right ventricular  size is normal. There is normal pulmonary artery systolic pressure.   3. Left atrial size was mildly dilated.   4. Right atrial size was mildly dilated.   5. The mitral valve is normal in structure. No evidence of mitral valve  regurgitation. No evidence of mitral stenosis.   6. The aortic valve is tricuspid. There is mild calcification of the  aortic valve. There is mild thickening of the aortic valve. Aortic valve  regurgitation is trivial. Aortic valve sclerosis is present, with no  evidence of  aortic valve stenosis.   7. The inferior vena cava is normal in size with greater than 50%  respiratory variability, suggesting right atrial pressure of 3 mmHg.    Recent Labs: 01/14/2023: TSH 2.191 01/17/2023: Magnesium 1.8 03/13/2023: ALT 13; BUN 14; Creatinine, Ser 1.19; Hemoglobin 13.7; Platelets 198; Potassium 4.4; Sodium 138  Recent Lipid Panel    Component Value Date/Time   CHOL 265 (H) 11/20/2022 1615   CHOL 180 06/21/2014 1449   CHOL 148 05/29/2013 1131   TRIG 339.0 (H) 11/20/2022 1615   TRIG 117 03/16/2014 1708   TRIG 133 05/29/2013 1131   HDL 42.80 11/20/2022 1615   HDL 56 06/21/2014 1449   HDL 55 03/16/2014 1708   HDL 48 05/29/2013 1131   CHOLHDL 6 11/20/2022 1615   VLDL 67.8 (H) 11/20/2022 1615   LDLCALC 154 (H) 10/18/2021 1508   LDLCALC 118 (H) 08/22/2020 1321   LDLCALC 77 03/16/2014 1708   LDLCALC 73 05/29/2013 1131   LDLDIRECT 168.0 11/20/2022 1615    History of Present Illness    89 year old female with the above past medial history including persistent atrial fibrillation, chronic diastolic heart failure, hypertension, hyperlipidemia, chronic renal insufficiency, and type 2 diabetes.  She was evaluated by Dr. [name] in 2022 in the setting of palpitations.  She was last seen in the office on 07/18/2021 and noted ongoing intermittent palpitations, history of falls.  Cardiac monitor in 07/2021 revealed PSVT.  Echo at the time showed severe concentric LVH, G3 DD, normal RV systolic function, no significant valvular abnormalities.  She was hospitalized in January 2024 in the setting of acute respiratory failure secondary to RSV, new atrial fibrillation with RVR.  Cardiology was consulted.  TSH was normal.  Repeat echocardiogram was normal.  She was started on Eliquis and metoprolol.  Given advanced age, renal dysfunction, borderline hypotension, rate consult strategy was recommended.  She returned to the ED on 03/13/2023 with generalized weakness EKG showed atrial  fibrillation.  She had a brief period of tachycardia in the setting of having missed her evening dose of beta-blocker.  She was discharged home in stable condition.  She presents today for follow-up accompanied by her daughter.  Since her hospitalization and most recent ED visit she notes ongoing dyspnea on exertion, fatigue, poor appetite, and generalized weakness.  Her blood pressure has been low.  She is tearful and frustrated that she is unable to tolerate regular activity including preparing meals, washing dishes which she notes she was able to do prior to her most recent hospitalization.  Home Medications    Current Outpatient Medications  Medication Sig Dispense Refill   acetaminophen (TYLENOL) 500 MG tablet Take 500 mg by mouth every 6 (six) hours as needed for pain.  apixaban (ELIQUIS) 2.5 MG TABS tablet Take 1 tablet (2.5 mg total) by mouth 2 (two) times daily. 60 tablet 3   Cholecalciferol (VITAMIN D3) 50 MCG (2000 UT) capsule Take 2,000 Units by mouth daily.      citalopram (CELEXA) 20 MG tablet TAKE 1 TABLET BY MOUTH EVERY DAY 90 tablet 1   Cyanocobalamin (B-12 IJ) Place 1,000 mcg under the tongue daily.     diazepam (VALIUM) 5 MG tablet TAKE 1/2 TO 1 TABLET DAILY AS NEEDED FOR ANXIETY (Patient taking differently: Take 2.5 mg by mouth daily.) 30 tablet 1   feeding supplement (ENSURE ENLIVE / ENSURE PLUS) LIQD Take 237 mLs by mouth 2 (two) times daily between meals. 14220 mL 2   gabapentin (NEURONTIN) 800 MG tablet Take 1 tablet (800 mg total) by mouth 3 (three) times daily. 270 tablet 1   mometasone-formoterol (DULERA) 200-5 MCG/ACT AERO Inhale 2 puffs into the lungs 2 (two) times daily. 1 each 2   rOPINIRole (REQUIP) 1 MG tablet TAKE 1 TABLET BY MOUTH AT BEDTIME. 90 tablet 1   tiZANidine (ZANAFLEX) 4 MG tablet Take 1 tablet (4 mg total) by mouth 2 (two) times daily as needed for muscle spasms. 180 tablet 1   venlafaxine XR (EFFEXOR-XR) 37.5 MG 24 hr capsule TAKE 1 CAPSULE BY MOUTH  DAILY WITH BREAKFAST. 90 capsule 1   benzonatate (TESSALON) 200 MG capsule Take 1 capsule (200 mg total) by mouth 3 (three) times daily as needed for cough. (Patient not taking: Reported on 03/21/2023) 20 capsule 0   calcium carbonate (OS-CAL) 600 MG TABS Take 600 mg by mouth 2 (two) times daily with a meal. (Patient not taking: Reported on 03/21/2023)     famotidine (PEPCID) 40 MG tablet TAKE 1 TABLET BY MOUTH EVERYDAY AT BEDTIME (Patient not taking: Reported on 03/21/2023) 90 tablet 1   guaiFENesin-dextromethorphan (ROBITUSSIN DM) 100-10 MG/5ML syrup Take 10 mLs by mouth every 4 (four) hours as needed for cough. (Patient not taking: Reported on 03/21/2023) 118 mL 0   metoprolol tartrate (LOPRESSOR) 25 MG tablet Take 1 tablet twice daily. Please hold medication on the days systolic blood pressure (top number) is less than 90. 180 tablet 3   Multiple Vitamin (MULTIVITAMIN WITH MINERALS) TABS tablet Take 1 tablet by mouth daily. (Patient not taking: Reported on 03/21/2023)     ondansetron (ZOFRAN-ODT) 4 MG disintegrating tablet Take 1 tablet (4 mg total) by mouth every 8 (eight) hours as needed for nausea or vomiting. (Patient not taking: Reported on 03/21/2023) 20 tablet 0   No current facility-administered medications for this visit.     Review of Systems    She denies chest pain, palpitations, pnd, orthopnea, n, v, dizziness, syncope, edema, weight gain, or early satiety. All other systems reviewed and are otherwise negative except as noted above.   Physical Exam    VS:  BP 96/62 (BP Location: Left Arm, Patient Position: Sitting, Cuff Size: Normal)   Pulse 95   Ht 5\' 6"  (1.676 m)   Wt 127 lb 12.8 oz (58 kg)   SpO2 99%   BMI 20.63 kg/m   GEN: Well nourished, well developed, in no acute distress. HEENT: normal. Neck: Supple, no JVD, carotid bruits, or masses. Cardiac: IRIR, no murmurs, rubs, or gallops. No clubbing, cyanosis, edema.  Radials/DP/PT 2+ and equal bilaterally.  Respiratory:   Respirations regular and unlabored, clear to auscultation bilaterally. GI: Soft, nontender, nondistended, BS + x 4. MS: no deformity or atrophy. Skin: warm and dry,  no rash. Neuro:  Strength and sensation are intact. Psych: Normal affect.  Accessory Clinical Findings    ECG personally reviewed by me today -atrial fibrillation, 95 bpm, bifascicular block- no acute changes.   Lab Results  Component Value Date   WBC 4.7 03/13/2023   HGB 13.7 03/13/2023   HCT 42.9 03/13/2023   MCV 88.1 03/13/2023   PLT 198 03/13/2023   Lab Results  Component Value Date   CREATININE 1.19 (H) 03/13/2023   BUN 14 03/13/2023   NA 138 03/13/2023   K 4.4 03/13/2023   CL 106 03/13/2023   CO2 22 03/13/2023   Lab Results  Component Value Date   ALT 13 03/13/2023   AST 22 03/13/2023   ALKPHOS 41 03/13/2023   BILITOT 0.3 03/13/2023   Lab Results  Component Value Date   CHOL 265 (H) 11/20/2022   HDL 42.80 11/20/2022   LDLCALC 154 (H) 10/18/2021   LDLDIRECT 168.0 11/20/2022   TRIG 339.0 (H) 11/20/2022   CHOLHDL 6 11/20/2022    Lab Results  Component Value Date   HGBA1C 6.8 (H) 11/20/2022    Assessment & Plan   1. Persistent atrial fibrillation: Newly diagnosed in January 2024. On Eliquis and metoprolol.  Rate control strategy was recommended.  However, she appears to be rather symptomatic with her A-fib. She notes generalized fatigue, weakness, and dyspnea on exertion. HR is somewhat elevated, BP is low. When discussing treatment options, patient states she does not wish to pursue DCCV or any other invasive procedures at this time.  Will trial increase of metoprolol to 25 mg twice daily and see if her BP tolerates this.  Will also discuss with Dr. Audie Box (who saw her in the hospital), possible initiation of amiodarone (BP may not tolerate full loading dose, but she may tolerate 200 mg daily).  She is following with palliative care, recommend close follow-up, ongoing goals of care  discussion.  ADDENDUM 03/29/2023: Per Dr. Audie Box, if pt remains in atrial fibrillation, could consider addition of amiodarone 200 mg daily. Can discuss at next follow-up visit.   2. Chronic diastolic heart failure: Recent echo in 12/2022 was essentially normal.  She notes significant dyspnea on exertion, generalized fatigue, otherwise, euvolemic and well compensated on exam.  I do not think her BP will tolerate Lasix at this point in time.  Will try to achieve better heart rate control to see if this reduces heart failure symptoms.  Consider amiodarone as above I suspect A-fib is driving some of her fluid retention, symptoms.  3. Hypertension: BP is soft with SBP in the 90s.  Continue to monitor BP.  Currenlty only taking metoprolol.  4. CKD stage IIIb: Creatinine was stable at 1.19 in 03/2023.  5. Type 2 diabetes: A1C was 6.8 in 10/2022.  Monitored and managed per PCP.  6. Disposition: Follow-up in 1 month.       Lenna Sciara, NP 03/21/2023, 8:29 PM

## 2023-03-21 NOTE — Patient Instructions (Signed)
Medication Instructions:  Increase Metoprolol 25 mg twice daily. Hold Metoprolol on the  days Systolic blood pressure (top number) is less than 90.   *If you need a refill on your cardiac medications before your next appointment, please call your pharmacy*   Lab Work: NONE ordered at this time of appointment   If you have labs (blood work) drawn today and your tests are completely normal, you will receive your results only by: [place] (if you have MyChart) OR A paper copy in the mail If you have any lab test that is abnormal or we need to change your treatment, we will call you to review the results.   Testing/Procedures: NONE ordered at this time of appointment     Follow-Up: At [facility], you and your health needs are our priority.  As part of our continuing mission to provide you with exceptional heart care, we have created designated Provider Care Teams.  These Care Teams include your primary Cardiologist (physician) and Advanced Practice Providers (APPs -  Physician Assistants and Nurse Practitioners) who all work together to provide you with the care you need, when you need it.  We recommend signing up for the patient portal called "MyChart".  Sign up information is provided on this After Visit Summary.  MyChart is used to connect with patients for Virtual Visits (Telemedicine).  Patients are able to view lab/test results, encounter notes, upcoming appointments, etc.  Non-urgent messages can be sent to your provider as well.   To learn more about what you can do with MyChart, go to [email or web address].    Your next appointment:   6-8  week(s)  Provider:   Diona Browner, NP        Other Instructions

## 2023-03-25 ENCOUNTER — Encounter: Payer: Self-pay | Admitting: Family Medicine

## 2023-03-25 ENCOUNTER — Ambulatory Visit (INDEPENDENT_AMBULATORY_CARE_PROVIDER_SITE_OTHER): Payer: Medicare Other | Admitting: Family Medicine

## 2023-03-25 ENCOUNTER — Other Ambulatory Visit (INDEPENDENT_AMBULATORY_CARE_PROVIDER_SITE_OTHER): Payer: Medicare Other

## 2023-03-25 VITALS — BP 112/72 | HR 63 | Temp 97.5°F | Resp 18 | Ht 66.0 in | Wt 133.8 lb

## 2023-03-25 DIAGNOSIS — R600 Localized edema: Secondary | ICD-10-CM | POA: Diagnosis not present

## 2023-03-25 DIAGNOSIS — M542 Cervicalgia: Secondary | ICD-10-CM

## 2023-03-25 DIAGNOSIS — N390 Urinary tract infection, site not specified: Secondary | ICD-10-CM

## 2023-03-25 DIAGNOSIS — E538 Deficiency of other specified B group vitamins: Secondary | ICD-10-CM

## 2023-03-25 DIAGNOSIS — I10 Essential (primary) hypertension: Secondary | ICD-10-CM

## 2023-03-25 DIAGNOSIS — E1142 Type 2 diabetes mellitus with diabetic polyneuropathy: Secondary | ICD-10-CM

## 2023-03-25 DIAGNOSIS — R5383 Other fatigue: Secondary | ICD-10-CM

## 2023-03-25 DIAGNOSIS — Z09 Encounter for follow-up examination after completed treatment for conditions other than malignant neoplasm: Secondary | ICD-10-CM

## 2023-03-25 NOTE — Progress Notes (Signed)
Pt had labs with Caleen Jobs, NP today and we were asked by NP to do Dr Frederik Pear labs today as well. Charges placed in [place] with Lovena Le.  Pt unable to urinate at OV. Cup given to pt and daughter will return specimen but says it may take up to 2 days to get the sample as the pt sometimes goes a day without urinating. Urine culture reordered as future.

## 2023-03-25 NOTE — Patient Instructions (Signed)
Checking labs today I want to make sure your kidney function is okay before adding fluid pills.  Please make sure your feet are elevated to heart level throughout the day when you are seated, wear compression socks, and try to minimize sodium in your diet.  We will let you know about lab results and plan.   Please contact office for follow-up if symptoms do not improve or worsen. Seek emergency care if symptoms become severe.

## 2023-03-25 NOTE — Progress Notes (Signed)
Acute Office Visit  Subjective:     Patient ID: Dana Burns, female    DOB: 1926/01/21, 89 y.o.   MRN: [id number]  Chief Complaint  Patient presents with   Edema    Kidney function and R shoulder pain    HPI Patient is in today for concerns about her kidney function  Recently in the ED with weakness and A. Fib.  She followed-up with cardiology on 03/21/23 and she was symptomatic with her A.fib. Her metoprol  was changed to 25 mg BID to see if she would get some improvement without dropping BP too low. NP was planning to discussed other options such as amiodarone with MD and develop plan - she is to follow-up with them in 1 month. Daughter states while there, they noticed her feet were swollen but given soft BPs they recommend she just monitor. Today they report that swelling seems to be worse and she has gained about 6 pounds in the past week. She denies any new chest pain, cough, or dyspnea. Report blood pressures have been stable at home. She reports that she started getting an appetite again last week and daughter has been cooking or picking up food for her - admits that she does not follow a sodium restriction. She has been eating various things including: chips, hot dog, pulled pork, hash browns, seasoned potato wedges, ham biscuit, tomato soup, french fries, cookies. States that she probably doesn't drink much fluid throughout the day. Reports both feet are equally swollen, but it has not progressed far up her legs. There is no skin discoloration or weeping. Feet are tender and she has trouble fitting in shoes due to the swelling. States she spends most of the day in a recliner with minimal leg elevation.   Additionally she reports her right neck/shoulder has been tender this morning. She is wondering if she slept wrong. No radiation of pain, numbness, tingling, or loss of function.      ROS All review of systems negative except what is listed in the HPI      Objective:    BP  112/72 (BP Location: Left Arm, Patient Position: Sitting, Cuff Size: Normal)   Pulse 63   Temp (!) 97.5 F (36.4 C) (Temporal)   Resp 18   Ht 5\' 6"  (1.676 m)   Wt 133 lb 12.8 oz (60.7 kg)   BMI 21.60 kg/m    Physical Exam Vitals reviewed.  Constitutional:      Appearance: Normal appearance.  HENT:     Head: Normocephalic and atraumatic.  Neck:     Comments: Right upper trap muscle tension to palpation  Cardiovascular:     Rate and Rhythm: Normal rate. Rhythm irregular.     Pulses: Normal pulses.  Pulmonary:     Effort: Pulmonary effort is normal.     Breath sounds: Normal breath sounds.  Musculoskeletal:     Comments: BLE +3, mostly to feet, no discoloration, erythema, weeping/drainage, wounds/ulcerations  Skin:    General: Skin is warm and dry.  Neurological:     Mental Status: She is alert and oriented to person, place, and time.  Psychiatric:        Mood and Affect: Mood normal.        Behavior: Behavior normal.        Thought Content: Thought content normal.        Judgment: Judgment normal.     No results found for any visits on 03/25/23.  Assessment & Plan:    Neck pain Right upper trap tension on palpation. Recommend heating pad, topical lidocaine, massage, gentle stretching. Follow-up if not improving.   Bilateral lower extremity edema No acute distress today. Checking BNP; previous PCP orders for CBC, CMP, etc being collected today as well - will review results.  Will wait on labs and review with PCP before adding diuretic.  Thorough discussion with patient and daughter on significance of low sodium diet, leg elevation, compression socks.  Patient aware of signs/symptoms requiring further/urgent evaluation.       No orders of the defined types were placed in this encounter.   Return if symptoms worsen or fail to improve, for ; routine f/u with PCP in 2 months.  Terrilyn Saver, NP

## 2023-03-26 ENCOUNTER — Other Ambulatory Visit: Payer: Medicare Other

## 2023-03-26 ENCOUNTER — Ambulatory Visit (INDEPENDENT_AMBULATORY_CARE_PROVIDER_SITE_OTHER): Payer: Medicare Other | Admitting: *Deleted

## 2023-03-26 DIAGNOSIS — N390 Urinary tract infection, site not specified: Secondary | ICD-10-CM | POA: Diagnosis not present

## 2023-03-26 DIAGNOSIS — Z0000 Encounter for general adult medical examination without abnormal findings: Secondary | ICD-10-CM | POA: Diagnosis not present

## 2023-03-26 LAB — COMPREHENSIVE METABOLIC PANEL
ALT: 11 U/L (ref 0–35)
AST: 17 U/L (ref 0–37)
Albumin: 3.8 g/dL (ref 3.5–5.2)
Alkaline Phosphatase: 57 U/L (ref 39–117)
BUN: 18 mg/dL (ref 6–23)
CO2: 28 mEq/L (ref 19–32)
Calcium: 9.3 mg/dL (ref 8.4–10.5)
Chloride: 103 mEq/L (ref 96–112)
Creatinine, Ser: 1.13 mg/dL (ref 0.40–1.20)
GFR: 41.5 mL/min — ABNORMAL LOW (ref >60.00)
Glucose, Bld: 94 mg/dL (ref 70–99)
Potassium: 5 mEq/L (ref 3.5–5.1)
Sodium: 138 mEq/L (ref 135–145)
Total Bilirubin: 0.4 mg/dL (ref 0.2–1.2)
Total Protein: 6 g/dL (ref 6.0–8.3)

## 2023-03-26 LAB — TSH: TSH: 3.05 u[IU]/mL (ref 0.35–5.50)

## 2023-03-26 LAB — CBC WITH DIFFERENTIAL/PLATELET
Basophils Absolute: 0 10*3/uL (ref 0.0–0.1)
Basophils Relative: 0.6 % (ref 0.0–3.0)
Eosinophils Absolute: 0.1 10*3/uL (ref 0.0–0.7)
Eosinophils Relative: 1.2 % (ref 0.0–5.0)
HCT: 37 % (ref 36.0–46.0)
Hemoglobin: 12.2 g/dL (ref 12.0–15.0)
Lymphocytes Relative: 14.6 % (ref 12.0–46.0)
Lymphs Abs: 0.8 10*3/uL (ref 0.7–4.0)
MCHC: 33 g/dL (ref 30.0–36.0)
MCV: 86.7 fl (ref 78.0–100.0)
Monocytes Absolute: 0.5 10*3/uL (ref 0.1–1.0)
Monocytes Relative: 9 % (ref 3.0–12.0)
Neutro Abs: 4 10*3/uL (ref 1.4–7.7)
Neutrophils Relative %: 74.6 % (ref 43.0–77.0)
Platelets: 236 10*3/uL (ref 150.0–400.0)
RBC: 4.26 Mil/uL (ref 3.87–5.11)
RDW: 17.8 % — ABNORMAL HIGH (ref 11.5–15.5)
WBC: 5.3 10*3/uL (ref 4.0–10.5)

## 2023-03-26 LAB — LIPID PANEL
Cholesterol: 212 mg/dL — ABNORMAL HIGH (ref 0–200)
HDL: 44 mg/dL (ref >39.00)
LDL Cholesterol: 143 mg/dL — ABNORMAL HIGH (ref 0–99)
NonHDL: 167.71
Total CHOL/HDL Ratio: 5
Triglycerides: 123 mg/dL (ref 0.0–149.0)
VLDL: 24.6 mg/dL (ref 0.0–40.0)

## 2023-03-26 LAB — MAGNESIUM: Magnesium: 2 mg/dL (ref 1.5–2.5)

## 2023-03-26 LAB — VITAMIN B12: Vitamin B-12: 358 pg/mL (ref 211–911)

## 2023-03-26 LAB — HEMOGLOBIN A1C: Hgb A1c MFr Bld: 6.6 % — ABNORMAL HIGH (ref 4.6–6.5)

## 2023-03-26 LAB — BRAIN NATRIURETIC PEPTIDE: Brain Natriuretic Peptide: 475 pg/mL — ABNORMAL HIGH (ref <100)

## 2023-03-26 MED ORDER — FUROSEMIDE 20 MG PO TABS: 20.0000 mg | ORAL_TABLET | Freq: Every day | ORAL | 0 refills | Status: DC

## 2023-03-26 NOTE — Addendum Note (Signed)
Addended by: Caleen Jobs B on: 03/26/2023 01:14 PM   Modules accepted: Orders

## 2023-03-26 NOTE — Progress Notes (Signed)
Subjective:   Dana Burns is a 89 y.o. female who presents for Medicare Annual (Subsequent) preventive examination.  I connected with  [name] on 03/26/23 by a audio enabled telemedicine application and verified that I am speaking with the correct person using two identifiers.  Patient Location: Home  Provider Location: Office/Clinic  I discussed the limitations of evaluation and management by telemedicine. The patient expressed understanding and agreed to proceed.   Review of Systems     Cardiac Risk Factors include: advanced age (>93men, >67 women);diabetes mellitus;hypertension     Objective:    There were no vitals filed for this visit. There is no height or weight on file to calculate BMI.     03/26/2023    3:44 PM 01/08/2023    4:08 PM 03/01/2022    3:07 PM 05/14/2017    2:29 PM 09/22/2015    2:35 PM 10/16/2012    8:56 AM  Advanced Directives  Does Patient Have a Medical Advance Directive? No No No No No Patient does not have advance directive  Would patient like information on creating a medical advance directive? No - Patient declined No - Patient declined No - Patient declined No - Patient declined No - patient declined information     Current Medications (verified) Outpatient Encounter Medications as of 03/26/2023  Medication Sig   acetaminophen (TYLENOL) 500 MG tablet Take 500 mg by mouth every 6 (six) hours as needed for pain.   apixaban (ELIQUIS) 2.5 MG TABS tablet Take 1 tablet (2.5 mg total) by mouth 2 (two) times daily.   benzonatate (TESSALON) 200 MG capsule Take 1 capsule (200 mg total) by mouth 3 (three) times daily as needed for cough.   calcium carbonate (OS-CAL) 600 MG TABS Take 600 mg by mouth 2 (two) times daily with a meal.   Cholecalciferol (VITAMIN D3) 50 MCG (2000 UT) capsule Take 2,000 Units by mouth daily.    citalopram (CELEXA) 20 MG tablet TAKE 1 TABLET BY MOUTH EVERY DAY   Cyanocobalamin (B-12 IJ) Place 1,000 mcg under the tongue daily.    diazepam (VALIUM) 5 MG tablet TAKE 1/2 TO 1 TABLET DAILY AS NEEDED FOR ANXIETY (Patient taking differently: Take 2.5 mg by mouth daily.)   famotidine (PEPCID) 40 MG tablet TAKE 1 TABLET BY MOUTH EVERYDAY AT BEDTIME   feeding supplement (ENSURE ENLIVE / ENSURE PLUS) LIQD Take 237 mLs by mouth 2 (two) times daily between meals.   furosemide (LASIX) 20 MG tablet Take 1 tablet (20 mg total) by mouth daily. Take for 3 days, then switch to as needed use for swelling associated with weight gain of 3 pounds in 24 hours or 6 pounds in a week. Let us know if you are having to use frequently so we can monitor kidney function.   gabapentin (NEURONTIN) 800 MG tablet Take 1 tablet (800 mg total) by mouth 3 (three) times daily.   guaiFENesin-dextromethorphan (ROBITUSSIN DM) 100-10 MG/5ML syrup Take 10 mLs by mouth every 4 (four) hours as needed for cough.   metoprolol tartrate (LOPRESSOR) 25 MG tablet Take 1 tablet twice daily. Please hold medication on the days systolic blood pressure (top number) is less than 90.   mometasone-formoterol (DULERA) 200-5 MCG/ACT AERO Inhale 2 puffs into the lungs 2 (two) times daily.   Multiple Vitamin (MULTIVITAMIN WITH MINERALS) TABS tablet Take 1 tablet by mouth daily.   ondansetron (ZOFRAN-ODT) 4 MG disintegrating tablet Take 1 tablet (4 mg total) by mouth every 8 (eight)  hours as needed for nausea or vomiting.   rOPINIRole (REQUIP) 1 MG tablet TAKE 1 TABLET BY MOUTH AT BEDTIME.   tiZANidine (ZANAFLEX) 4 MG tablet Take 1 tablet (4 mg total) by mouth 2 (two) times daily as needed for muscle spasms.   venlafaxine XR (EFFEXOR-XR) 37.5 MG 24 hr capsule TAKE 1 CAPSULE BY MOUTH DAILY WITH BREAKFAST.   No facility-administered encounter medications on file as of 03/26/2023.    Allergies (verified) Tape, Actos [pioglitazone hydrochloride], Buspar [buspirone hcl], Doxycycline, Lipitor [atorvastatin calcium], and Penicillins   History: Past Medical History:  Diagnosis Date    Abdominal pain 11-Oct-2017   Allergy    sneeze, rhinorrhea in am   Anxiety state 09/03/2013   Breast mass, right 10/14/2012   Chicken pox as a child   Chronic pain syndrome 01/23/2015   Chronic renal insufficiency 03/17/2011   Colon cancer ([place])    Complication of anesthesia    agitated when waking up, wakes up shaking, "like  I'm going into shock"   Constipation October 11, 2017   Depression    husband died  3 months ago, pt. admits depression  currently    Diabetic neuropathy ([place]) 03/17/2011   DM (diabetes mellitus) type 2, uncontrolled, with ketoacidosis ([place]) 03/17/2011   Ductal hyperplasia of breast 03/17/2011   Essential hypertension, benign 05/31/2010   Qualifier: Diagnosis of  By: Percival Spanish, MD, Bethann Goo 07/15/2021   Fibromyalgia    GERD (gastroesophageal reflux disease)    H/O echocardiogram    done /w [place] in 2011, saw Dr. Percival Spanish for tachycardia    Headache    History of colon cancer 03/17/2011   Hyperlipidemia    6 years   Hyperlipidemia, mixed    6 years   Hypertension    17 years, reports she has a rapid heartrate    Loss of weight 12/14/2013   Measles as a child   Osteoarthritis (arthritis due to wear and tear of joints) 03/17/2011   Noted diffusely including neck   Osteopenia 12/14/2013   Pain in finger of right hand 05/15/2017   Pain in joint, lower leg 09/03/2013   Pain of right shoulder region 07/07/2014   Peripheral neuropathy    SOB (shortness of breath) 05/31/2010   Qualifier: Diagnosis of  By: Percival Spanish, MD, Farrel Gordon     Stress 06/01/2013   Tachycardia 05/31/2010   Qualifier: Diagnosis of  By: Percival Spanish, MD, Farrel Gordon     Type 2 diabetes mellitus with peripheral neuropathy ([place]) 03/17/2011   Vitamin B 12 deficiency 07/07/2014   intrinsic factor positive   Past Surgical History:  Procedure Laterality Date   BREAST EXCISIONAL BIOPSY Right 2013   benign   BREAST SURGERY  2013   right- benign   CATARACT EXTRACTION     /w IOL- bilateral    COLON  SURGERY     For resection of cancer   Nodule removed     From throat   Family History  Problem Relation Age of Onset   Cancer Mother        Brain   Cancer Father        Colorectal   Cancer Sister        breast   Other Brother        pacemaker   Cancer Brother        pancreatic cancer   Arthritis Sister    Emphysema Brother    Cancer Brother    COPD Brother  Neuropathy Daughter    Fibromyalgia Daughter    Cancer Brother        metastatic colon cancer   Heart disease Neg Hx        Early   Social History   Socioeconomic History   Marital status: Widowed    Spouse name: Not on file   Number of children: 1   Years of education: 9   Highest education level: Not on file  Occupational History   Occupation: Retired  Tobacco Use   Smoking status: Never   Smokeless tobacco: Never  Substance and Sexual Activity   Alcohol use: No   Drug use: No   Sexual activity: Never    Comment: lives with daughter. no dietary restrictions  Other Topics Concern   Not on file  Social History Narrative   Patient lives at home with daughter.    Patient is retired.    Patient is widowed.    Patient has 1 child.    Patient has a 9th grade education.    Social Determinants of Health   Financial Resource Strain: Low Risk  (03/01/2022)   Overall Financial Resource Strain (CARDIA)    Difficulty of Paying Living Expenses: Not hard at all  Food Insecurity: No Food Insecurity (01/18/2023)   Hunger Vital Sign    Worried About Running Out of Food in the Last Year: Never true    Ran Out of Food in the Last Year: Never true  Transportation Needs: No Transportation Needs (01/18/2023)   PRAPARE - Hydrologist (Medical): No    Lack of Transportation (Non-Medical): No  Physical Activity: Inactive (03/01/2022)   Exercise Vital Sign    Days of Exercise per Week: 0 days    Minutes of Exercise per Session: 0 min  Stress: No Stress Concern Present (03/01/2022)   [place]    Feeling of Stress : Not at all  Social Connections: Socially Isolated (03/26/2023)   Social Connection and Isolation Panel [NHANES]    Frequency of Communication with Friends and Family: More than three times a week    Frequency of Social Gatherings with Friends and Family: More than three times a week    Attends Religious Services: Never    Marine scientist or Organizations: No    Attends Archivist Meetings: Never    Marital Status: Widowed    Tobacco Counseling Counseling given: Not Answered   Clinical Intake:  Pre-visit preparation completed: Yes  Pain : 0-10 Faces Pain Scale: Hurts little more Pain Location: Leg Pain Orientation: Right Pain Descriptors / Indicators: Aching Pain Onset: More than a month ago Pain Frequency: Intermittent  Faces Pain Scale: Hurts little more  Nutritional Risks: Other (Comment) Diabetes: Yes CBG done?: No Did pt. bring in CBG monitor from home?: No  How often do you need to have someone help you when you read instructions, pamphlets, or other written materials from your doctor or pharmacy?: 1 - Never  Activities of Daily Living    03/26/2023    3:56 PM 01/09/2023    5:03 PM  In your present state of health, do you have any difficulty performing the following activities:  Hearing? 1 0  Vision? 0 0  Difficulty concentrating or making decisions? 0 0  Walking or climbing stairs? 1 1  Dressing or bathing? 0 1  Doing errands, shopping? 1 1  Comment daughter assists   Conservation officer, nature and  eating ? Y   Using the Toilet? N   In the past six months, have you accidently leaked urine? Y   Do you have problems with loss of bowel control? N   Managing your Medications? Y   Comment daughter assists   Managing your Finances? Y   Comment daughter assists   Housekeeping or managing your Housekeeping? Y   Comment daughter assists     Patient Care  Team: Mosie Lukes, MD as PCP - General (Family Medicine) Revankar, Reita Cliche, MD as PCP - Cardiology (Cardiology) Marica Otter, [place] as Consulting Physician (Optometry) Garlan Fair, MD as Consulting Physician (Gastroenterology) Doreatha Martin, MD as Referring Physician (Physical Medicine and Rehabilitation)  Indicate any recent Medical Services you may have received from other than [facility] providers in the past year (date may be approximate).     Assessment:   This is a routine wellness examination for [employer].  Hearing/Vision screen No results found.  Dietary issues and exercise activities discussed: Current Exercise Habits: The patient does not participate in regular exercise at present, Exercise limited by: orthopedic condition(s)   Goals Addressed   None    Depression Screen    03/26/2023    3:55 PM 02/25/2023    1:59 PM 01/07/2023    1:24 PM 11/20/2022    3:17 PM 08/14/2022    2:59 PM 03/01/2022    3:13 PM 10/12/2021    3:34 PM  PHQ 2/9 Scores  PHQ - 2 Score 1 0 0 0 0 1 0  PHQ- 9 Score     1      Fall Risk    03/26/2023    3:50 PM 02/25/2023    1:59 PM 02/25/2023    1:55 PM 01/07/2023    1:24 PM 11/20/2022    3:17 PM  Fall Risk   Falls in the past year? 1 1 0 0 0  Number falls in past yr: 1 1 0 0 0  Injury with Fall? 0 0 0 0 0  Risk for fall due to : Impaired balance/gait;Orthopedic patient   No Fall Risks   Follow up Falls evaluation completed Falls evaluation completed Falls evaluation completed Falls evaluation completed Falls evaluation completed    [place]:  Any stairs in or around the home? No  Home free of loose throw rugs in walkways, pet beds, electrical cords, etc? Yes  Adequate lighting in your home to reduce risk of falls? Yes   ASSISTIVE DEVICES UTILIZED TO PREVENT FALLS:  Life alert? No  Use of a cane, walker or w/c? Yes  Grab bars in the bathroom? No  Shower chair or bench in shower? Yes  Elevated  toilet seat or a handicapped toilet? No   TIMED UP AND GO:  Was the test performed?  No, audio visit .    Cognitive Function:    03/26/2023    4:06 PM 05/14/2017    2:33 PM 09/22/2015    3:04 PM  MMSE - Mini Mental State Exam  Not completed: Unable to complete    Orientation to time  5 5  Orientation to Place  5 5  Registration  3 3  Attention/ Calculation  5 5  Recall  2 3  Language- name 2 objects  2 2  Language- repeat  1 1  Language- follow 3 step command  3 3  Language- read & follow direction  1 1  Write a sentence  1 1  Copy design  1 1  Total score  29 30        Immunizations Immunization History  Administered Date(s) Administered   Fluad Quad(high Dose 65+) 10/12/2021, 11/20/2022   Influenza Whole 09/30/2010   Influenza, High Dose Seasonal PF 09/24/2017, 10/14/2018, 09/14/2019   Influenza,inj,Quad PF,6+ Mos 08/24/2014, 11/13/2016   Influenza-Unspecified 01/04/2014, 10/13/2015, 09/10/2020   PFIZER(Purple Top)SARS-COV-2 Vaccination 04/21/2020, 05/12/2020   Pneumococcal Conjugate-13 09/14/2014   Pneumococcal Polysaccharide-23 10/27/2015   Td 01/01/2004   Tdap 10/17/2011    TDAP status: Due, Education has been provided regarding the importance of this vaccine. Advised may receive this vaccine at local pharmacy or Health Dept. Aware to provide a copy of the vaccination record if obtained from local pharmacy or Health Dept. Verbalized acceptance and understanding.  Flu Vaccine status: Up to date  Pneumococcal vaccine status: Up to date  Covid-19 vaccine status: Information provided on how to obtain vaccines.   Qualifies for Shingles Vaccine? Yes   Zostavax completed No   Shingrix Completed?: No.    Education has been provided regarding the importance of this vaccine. Patient has been advised to call insurance company to determine out of pocket expense if they have not yet received this vaccine. Advised may also receive vaccine at local pharmacy or Health Dept.  Verbalized acceptance and understanding.  Screening Tests Health Maintenance  Topic Date Due   Zoster Vaccines- Shingrix (1 of 2) Never done   FOOT EXAM  02/11/2018   OPHTHALMOLOGY EXAM  04/03/2018   COVID-19 Vaccine (3 - Pfizer risk series) 06/09/2020   DTaP/Tdap/Td (3 - Td or Tdap) 10/16/2021   HEMOGLOBIN A1C  09/25/2023   Pneumonia Vaccine 68+ Years old  Completed   INFLUENZA VACCINE  Completed   DEXA SCAN  Completed   HPV VACCINES  Aged Out    Health Maintenance  Health Maintenance Due  Topic Date Due   Zoster Vaccines- Shingrix (1 of 2) Never done   FOOT EXAM  02/11/2018   OPHTHALMOLOGY EXAM  04/03/2018   COVID-19 Vaccine (3 - Pfizer risk series) 06/09/2020   DTaP/Tdap/Td (3 - Td or Tdap) 10/16/2021    Colorectal cancer screening: No longer required.   Mammogram status: pt declined.  Bone Density status: pt declined.  Lung Cancer Screening: (Low Dose CT Chest recommended if Age 38-80 years, 30 pack-year currently smoking OR have quit w/in 15years.) does not qualify.   Additional Screening:  Hepatitis C Screening: does not qualify  Vision Screening: Recommended annual ophthalmology exams for early detection of glaucoma and other disorders of the eye. Is the patient up to date with their annual eye exam?  No Who is the provider or what is the name of the office in which the patient attends annual eye exams? Dr. Sabra Heck If pt is not established with a provider, would they like to be referred to a provider to establish care? No .   Dental Screening: Recommended annual dental exams for proper oral hygiene  Community Resource Referral / Chronic Care Management: CRR required this visit?  No   CCM required this visit?  No      Plan:     I have personally reviewed and noted the following in the patient's chart:   Medical and social history Use of alcohol, tobacco or illicit drugs  Current medications and supplements including opioid prescriptions. Patient is  not currently taking opioid prescriptions. Functional ability and status Nutritional status Physical activity Advanced directives List of other physicians Hospitalizations, surgeries, and ER  visits in previous 12 months Vitals Screenings to include cognitive, depression, and falls Referrals and appointments  In addition, I have reviewed and discussed with patient certain preventive protocols, quality metrics, and best practice recommendations. A written personalized care plan for preventive services as well as general preventive health recommendations were provided to patient.   Due to this being a telephonic visit, the after visit summary with patients personalized plan was offered to patient via mail or my-chart. Patient would like to access on my-chart.   Beatris Ship, [place]   03/26/2023   Nurse Notes: None

## 2023-03-26 NOTE — Progress Notes (Signed)
No charge today. Pt returning sample she could not obtain at visit yesterday.

## 2023-03-26 NOTE — Patient Instructions (Signed)
Dana Burns , Thank you for taking time to come for your Medicare Wellness Visit. I appreciate your ongoing commitment to your health goals. Please review the following plan we discussed and let me know if I can assist you in the future.     This is a list of the screening recommended for you and due dates:  Health Maintenance  Topic Date Due   Zoster (Shingles) Vaccine (1 of 2) Never done   Complete foot exam   02/11/2018   Eye exam for diabetics  04/03/2018   COVID-19 Vaccine (3 - Pfizer risk series) 06/09/2020   DTaP/Tdap/Td vaccine (3 - Td or Tdap) 10/16/2021   Hemoglobin A1C  09/25/2023   Pneumonia Vaccine  Completed   Flu Shot  Completed   DEXA scan (bone density measurement)  Completed   HPV Vaccine  Aged Out    Next appointment: Follow up in one year for your annual wellness visit.   Preventive Care 35 Years and Older, Female Preventive care refers to lifestyle choices and visits with your health care provider that can promote health and wellness. What does preventive care include? A yearly physical exam. This is also called an annual well check. Dental exams once or twice a year. Routine eye exams. Ask your health care provider how often you should have your eyes checked. Personal lifestyle choices, including: Daily care of your teeth and gums. Regular physical activity. Eating a healthy diet. Avoiding tobacco and drug use. Limiting alcohol use. Practicing safe sex. Taking low-dose aspirin every day. Taking vitamin and mineral supplements as recommended by your health care provider. What happens during an annual well check? The services and screenings done by your health care provider during your annual well check will depend on your age, overall health, lifestyle risk factors, and family history of disease. Counseling  Your health care provider may ask you questions about your: Alcohol use. Tobacco use. Drug use. Emotional well-being. Home and relationship  well-being. Sexual activity. Eating habits. History of falls. Memory and ability to understand (cognition). Work and work Statistician. Reproductive health. Screening  You may have the following tests or measurements: Height, weight, and BMI. Blood pressure. Lipid and cholesterol levels. These may be checked every 5 years, or more frequently if you are over 26 years old. Skin check. Lung cancer screening. You may have this screening every year starting at age 2 if you have a 30-pack-year history of smoking and currently smoke or have quit within the past 15 years. Fecal occult blood test (FOBT) of the stool. You may have this test every year starting at age 24. Flexible sigmoidoscopy or colonoscopy. You may have a sigmoidoscopy every 5 years or a colonoscopy every 10 years starting at age 24. Hepatitis C blood test. Hepatitis B blood test. Sexually transmitted disease (STD) testing. Diabetes screening. This is done by checking your blood sugar (glucose) after you have not eaten for a while (fasting). You may have this done every 1-3 years. Bone density scan. This is done to screen for osteoporosis. You may have this done starting at age 28. Mammogram. This may be done every 1-2 years. Talk to your health care provider about how often you should have regular mammograms. Talk with your health care provider about your test results, treatment options, and if necessary, the need for more tests. Vaccines  Your health care provider may recommend certain vaccines, such as: Influenza vaccine. This is recommended every year. Tetanus, diphtheria, and acellular pertussis (Tdap, Td) vaccine. You  may need a Td booster every 10 years. Zoster vaccine. You may need this after age 64. Pneumococcal 13-valent conjugate (PCV13) vaccine. One dose is recommended after age 65. Pneumococcal polysaccharide (PPSV23) vaccine. One dose is recommended after age 50. Talk to your health care provider about which  screenings and vaccines you need and how often you need them. This information is not intended to replace advice given to you by your health care provider. Make sure you discuss any questions you have with your health care provider. Document Released: 01/13/2016 Document Revised: 09/05/2016 Document Reviewed: 10/18/2015 Elsevier Interactive Patient Education  2017 [employer] Prevention in the Home Falls can cause injuries. They can happen to people of all ages. There are many things you can do to make your home safe and to help prevent falls. What can I do on the outside of my home? Regularly fix the edges of walkways and driveways and fix any cracks. Remove anything that might make you trip as you walk through a door, such as a raised step or threshold. Trim any bushes or trees on the path to your home. Use bright outdoor lighting. Clear any walking paths of anything that might make someone trip, such as rocks or tools. Regularly check to see if handrails are loose or broken. Make sure that both sides of any steps have handrails. Any raised decks and porches should have guardrails on the edges. Have any leaves, snow, or ice cleared regularly. Use sand or salt on walking paths during winter. Clean up any spills in your garage right away. This includes oil or grease spills. What can I do in the bathroom? Use night lights. Install grab bars by the toilet and in the tub and shower. Do not use towel bars as grab bars. Use non-skid mats or decals in the tub or shower. If you need to sit down in the shower, use a plastic, non-slip stool. Keep the floor dry. Clean up any water that spills on the floor as soon as it happens. Remove soap buildup in the tub or shower regularly. Attach bath mats securely with double-sided non-slip rug tape. Do not have throw rugs and other things on the floor that can make you trip. What can I do in the bedroom? Use night lights. Make sure that you have a  light by your bed that is easy to reach. Do not use any sheets or blankets that are too big for your bed. They should not hang down onto the floor. Have a firm chair that has side arms. You can use this for support while you get dressed. Do not have throw rugs and other things on the floor that can make you trip. What can I do in the kitchen? Clean up any spills right away. Avoid walking on wet floors. Keep items that you use a lot in easy-to-reach places. If you need to reach something above you, use a strong step stool that has a grab bar. Keep electrical cords out of the way. Do not use floor polish or wax that makes floors slippery. If you must use wax, use non-skid floor wax. Do not have throw rugs and other things on the floor that can make you trip. What can I do with my stairs? Do not leave any items on the stairs. Make sure that there are handrails on both sides of the stairs and use them. Fix handrails that are broken or loose. Make sure that handrails are as long as the  stairways. Check any carpeting to make sure that it is firmly attached to the stairs. Fix any carpet that is loose or worn. Avoid having throw rugs at the top or bottom of the stairs. If you do have throw rugs, attach them to the floor with carpet tape. Make sure that you have a light switch at the top of the stairs and the bottom of the stairs. If you do not have them, ask someone to add them for you. What else can I do to help prevent falls? Wear shoes that: Do not have high heels. Have rubber bottoms. Are comfortable and fit you well. Are closed at the toe. Do not wear sandals. If you use a stepladder: Make sure that it is fully opened. Do not climb a closed stepladder. Make sure that both sides of the stepladder are locked into place. Ask someone to hold it for you, if possible. Clearly mark and make sure that you can see: Any grab bars or handrails. First and last steps. Where the edge of each step  is. Use tools that help you move around (mobility aids) if they are needed. These include: Canes. Walkers. Scooters. Crutches. Turn on the lights when you go into a dark area. Replace any light bulbs as soon as they burn out. Set up your furniture so you have a clear path. Avoid moving your furniture around. If any of your floors are uneven, fix them. If there are any pets around you, be aware of where they are. Review your medicines with your doctor. Some medicines can make you feel dizzy. This can increase your chance of falling. Ask your doctor what other things that you can do to help prevent falls. This information is not intended to replace advice given to you by your health care provider. Make sure you discuss any questions you have with your health care provider. Document Released: 10/13/2009 Document Revised: 05/24/2016 Document Reviewed: 01/21/2015 Elsevier Interactive Patient Education  2017 [employer].

## 2023-03-27 ENCOUNTER — Encounter (HOSPITAL_COMMUNITY): Payer: Self-pay | Admitting: Emergency Medicine

## 2023-03-27 ENCOUNTER — Observation Stay (HOSPITAL_COMMUNITY)
Admission: EM | Admit: 2023-03-27 | Discharge: 2023-03-28 | Disposition: A | Discharge status: Home / Self Care | Payer: Medicare Other | Attending: Internal Medicine | Admitting: Internal Medicine

## 2023-03-27 ENCOUNTER — Emergency Department (HOSPITAL_COMMUNITY): Payer: Medicare Other

## 2023-03-27 ENCOUNTER — Other Ambulatory Visit: Payer: Self-pay

## 2023-03-27 ENCOUNTER — Observation Stay (HOSPITAL_COMMUNITY): Payer: Medicare Other

## 2023-03-27 DIAGNOSIS — Z515 Encounter for palliative care: Secondary | ICD-10-CM

## 2023-03-27 DIAGNOSIS — I482 Chronic atrial fibrillation, unspecified: Secondary | ICD-10-CM | POA: Diagnosis not present

## 2023-03-27 DIAGNOSIS — R600 Localized edema: Secondary | ICD-10-CM | POA: Insufficient documentation

## 2023-03-27 DIAGNOSIS — R262 Difficulty in walking, not elsewhere classified: Secondary | ICD-10-CM

## 2023-03-27 DIAGNOSIS — I5033 Acute on chronic diastolic (congestive) heart failure: Principal | ICD-10-CM | POA: Insufficient documentation

## 2023-03-27 DIAGNOSIS — Z79899 Other long term (current) drug therapy: Secondary | ICD-10-CM | POA: Insufficient documentation

## 2023-03-27 DIAGNOSIS — Z7901 Long term (current) use of anticoagulants: Secondary | ICD-10-CM | POA: Insufficient documentation

## 2023-03-27 DIAGNOSIS — E1142 Type 2 diabetes mellitus with diabetic polyneuropathy: Secondary | ICD-10-CM | POA: Diagnosis present

## 2023-03-27 DIAGNOSIS — Z85038 Personal history of other malignant neoplasm of large intestine: Secondary | ICD-10-CM | POA: Diagnosis not present

## 2023-03-27 DIAGNOSIS — I130 Hypertensive heart and chronic kidney disease with heart failure and stage 1 through stage 4 chronic kidney disease, or unspecified chronic kidney disease: Secondary | ICD-10-CM | POA: Diagnosis not present

## 2023-03-27 DIAGNOSIS — M549 Dorsalgia, unspecified: Secondary | ICD-10-CM | POA: Diagnosis not present

## 2023-03-27 DIAGNOSIS — S3993XA Unspecified injury of pelvis, initial encounter: Secondary | ICD-10-CM | POA: Diagnosis not present

## 2023-03-27 DIAGNOSIS — F418 Other specified anxiety disorders: Secondary | ICD-10-CM | POA: Diagnosis present

## 2023-03-27 DIAGNOSIS — E1122 Type 2 diabetes mellitus with diabetic chronic kidney disease: Secondary | ICD-10-CM | POA: Diagnosis not present

## 2023-03-27 DIAGNOSIS — R609 Edema, unspecified: Secondary | ICD-10-CM

## 2023-03-27 DIAGNOSIS — R4182 Altered mental status, unspecified: Secondary | ICD-10-CM | POA: Insufficient documentation

## 2023-03-27 DIAGNOSIS — N1832 Chronic kidney disease, stage 3b: Secondary | ICD-10-CM | POA: Diagnosis not present

## 2023-03-27 DIAGNOSIS — R0600 Dyspnea, unspecified: Secondary | ICD-10-CM | POA: Diagnosis present

## 2023-03-27 DIAGNOSIS — R6889 Other general symptoms and signs: Secondary | ICD-10-CM | POA: Diagnosis not present

## 2023-03-27 DIAGNOSIS — W19XXXA Unspecified fall, initial encounter: Secondary | ICD-10-CM | POA: Insufficient documentation

## 2023-03-27 DIAGNOSIS — I10 Essential (primary) hypertension: Secondary | ICD-10-CM | POA: Diagnosis not present

## 2023-03-27 DIAGNOSIS — Z743 Need for continuous supervision: Secondary | ICD-10-CM | POA: Diagnosis not present

## 2023-03-27 DIAGNOSIS — R079 Chest pain, unspecified: Secondary | ICD-10-CM | POA: Diagnosis not present

## 2023-03-27 DIAGNOSIS — M25561 Pain in right knee: Secondary | ICD-10-CM | POA: Diagnosis present

## 2023-03-27 DIAGNOSIS — I509 Heart failure, unspecified: Secondary | ICD-10-CM | POA: Diagnosis present

## 2023-03-27 DIAGNOSIS — Z7189 Other specified counseling: Secondary | ICD-10-CM

## 2023-03-27 DIAGNOSIS — M25461 Effusion, right knee: Secondary | ICD-10-CM | POA: Diagnosis not present

## 2023-03-27 DIAGNOSIS — R296 Repeated falls: Secondary | ICD-10-CM

## 2023-03-27 LAB — URINALYSIS, ROUTINE W REFLEX MICROSCOPIC
Bilirubin Urine: NEGATIVE
Glucose, UA: NEGATIVE mg/dL
Hgb urine dipstick: NEGATIVE
Ketones, ur: NEGATIVE mg/dL
Leukocytes,Ua: NEGATIVE
Nitrite: NEGATIVE
Protein, ur: NEGATIVE mg/dL
Specific Gravity, Urine: 1.008 (ref 1.005–1.030)
pH: 5 (ref 5.0–8.0)

## 2023-03-27 LAB — COMPREHENSIVE METABOLIC PANEL
ALT: 15 U/L (ref 0–44)
AST: 19 U/L (ref 15–41)
Albumin: 3.3 g/dL — ABNORMAL LOW (ref 3.5–5.0)
Alkaline Phosphatase: 51 U/L (ref 38–126)
Anion gap: 7 (ref 5–15)
BUN: 15 mg/dL (ref 8–23)
CO2: 27 mmol/L (ref 22–32)
Calcium: 8.8 mg/dL — ABNORMAL LOW (ref 8.9–10.3)
Chloride: 105 mmol/L (ref 98–111)
Creatinine, Ser: 0.98 mg/dL (ref 0.44–1.00)
GFR, Estimated: 53 mL/min — ABNORMAL LOW (ref >60)
Glucose, Bld: 90 mg/dL (ref 70–99)
Potassium: 4.6 mmol/L (ref 3.5–5.1)
Sodium: 139 mmol/L (ref 135–145)
Total Bilirubin: 0.6 mg/dL (ref 0.3–1.2)
Total Protein: 5.9 g/dL — ABNORMAL LOW (ref 6.5–8.1)

## 2023-03-27 LAB — CBC WITH DIFFERENTIAL/PLATELET
Abs Immature Granulocytes: 0.01 10*3/uL (ref 0.00–0.07)
Basophils Absolute: 0 10*3/uL (ref 0.0–0.1)
Basophils Relative: 0 %
Eosinophils Absolute: 0.1 10*3/uL (ref 0.0–0.5)
Eosinophils Relative: 2 %
HCT: 39.4 % (ref 36.0–46.0)
Hemoglobin: 12.2 g/dL (ref 12.0–15.0)
Immature Granulocytes: 0 %
Lymphocytes Relative: 13 %
Lymphs Abs: 0.6 10*3/uL — ABNORMAL LOW (ref 0.7–4.0)
MCH: 27.7 pg (ref 26.0–34.0)
MCHC: 31 g/dL (ref 30.0–36.0)
MCV: 89.3 fL (ref 80.0–100.0)
Monocytes Absolute: 0.4 10*3/uL (ref 0.1–1.0)
Monocytes Relative: 8 %
Neutro Abs: 3.3 10*3/uL (ref 1.7–7.7)
Neutrophils Relative %: 77 %
Platelets: 255 10*3/uL (ref 150–400)
RBC: 4.41 MIL/uL (ref 3.87–5.11)
RDW: 16.9 % — ABNORMAL HIGH (ref 11.5–15.5)
WBC: 4.3 10*3/uL (ref 4.0–10.5)
nRBC: 0 % (ref 0.0–0.2)

## 2023-03-27 LAB — URINE CULTURE
MICRO NUMBER:: 14743057
Result:: NO GROWTH
SPECIMEN QUALITY:: ADEQUATE

## 2023-03-27 LAB — BRAIN NATRIURETIC PEPTIDE: B Natriuretic Peptide: 644.6 pg/mL — ABNORMAL HIGH (ref 0.0–100.0)

## 2023-03-27 LAB — CBG MONITORING, ED: Glucose-Capillary: 91 mg/dL (ref 70–99)

## 2023-03-27 MED ORDER — METOPROLOL TARTRATE 5 MG/5ML IV SOLN
2.5000 mg | Freq: Once | INTRAVENOUS | Status: AC
  Administered 2023-03-27: 2.5 mg via INTRAVENOUS
  Filled 2023-03-27: qty 5

## 2023-03-27 MED ORDER — ROPINIROLE HCL 1 MG PO TABS: 1.0000 mg | ORAL_TABLET | Freq: Every day | ORAL | Status: DC

## 2023-03-27 MED ORDER — ONDANSETRON HCL 4 MG PO TABS: 4.0000 mg | ORAL_TABLET | Freq: Four times a day (QID) | ORAL | Status: DC | PRN

## 2023-03-27 MED ORDER — ACETAMINOPHEN 650 MG RE SUPP: 650.0000 mg | Freq: Four times a day (QID) | RECTAL | Status: DC | PRN

## 2023-03-27 MED ORDER — SENNOSIDES-DOCUSATE SODIUM 8.6-50 MG PO TABS: 1.0000 | ORAL_TABLET | Freq: Every evening | ORAL | Status: DC | PRN

## 2023-03-27 MED ORDER — GABAPENTIN 400 MG PO CAPS
800.0000 mg | ORAL_CAPSULE | Freq: Three times a day (TID) | ORAL | Status: DC
  Administered 2023-03-28 (×2): 800 mg via ORAL
  Filled 2023-03-27 (×2): qty 2

## 2023-03-27 MED ORDER — FAMOTIDINE 20 MG PO TABS
40.0000 mg | ORAL_TABLET | Freq: Every day | ORAL | Status: DC
  Administered 2023-03-27: 40 mg via ORAL
  Filled 2023-03-27: qty 2

## 2023-03-27 MED ORDER — CITALOPRAM HYDROBROMIDE 20 MG PO TABS
20.0000 mg | ORAL_TABLET | Freq: Every day | ORAL | Status: DC
  Administered 2023-03-28: 20 mg via ORAL
  Filled 2023-03-27: qty 1

## 2023-03-27 MED ORDER — ACETAMINOPHEN 325 MG PO TABS
650.0000 mg | ORAL_TABLET | Freq: Four times a day (QID) | ORAL | Status: DC | PRN
  Administered 2023-03-28: 650 mg via ORAL
  Filled 2023-03-27: qty 2

## 2023-03-27 MED ORDER — METOPROLOL TARTRATE 25 MG PO TABS
25.0000 mg | ORAL_TABLET | Freq: Two times a day (BID) | ORAL | Status: DC
  Administered 2023-03-28: 25 mg via ORAL
  Filled 2023-03-27: qty 1

## 2023-03-27 MED ORDER — SODIUM CHLORIDE 0.9% FLUSH
3.0000 mL | Freq: Two times a day (BID) | INTRAVENOUS | Status: DC
  Administered 2023-03-27 – 2023-03-28 (×2): 3 mL via INTRAVENOUS

## 2023-03-27 MED ORDER — FUROSEMIDE 10 MG/ML IJ SOLN
20.0000 mg | Freq: Two times a day (BID) | INTRAMUSCULAR | Status: DC
  Administered 2023-03-28: 20 mg via INTRAVENOUS
  Filled 2023-03-27: qty 2

## 2023-03-27 MED ORDER — APIXABAN 2.5 MG PO TABS
2.5000 mg | ORAL_TABLET | Freq: Two times a day (BID) | ORAL | Status: DC
  Administered 2023-03-27 – 2023-03-28 (×2): 2.5 mg via ORAL
  Filled 2023-03-27 (×2): qty 1

## 2023-03-27 MED ORDER — ONDANSETRON HCL 4 MG/2ML IJ SOLN: 4.0000 mg | Freq: Four times a day (QID) | INTRAMUSCULAR | Status: DC | PRN

## 2023-03-27 MED ORDER — FUROSEMIDE 10 MG/ML IJ SOLN
20.0000 mg | Freq: Once | INTRAMUSCULAR | Status: AC
  Administered 2023-03-27: 20 mg via INTRAVENOUS
  Filled 2023-03-27: qty 4

## 2023-03-27 MED ORDER — DIAZEPAM 5 MG PO TABS
2.5000 mg | ORAL_TABLET | Freq: Every day | ORAL | Status: DC
  Administered 2023-03-28: 2.5 mg via ORAL
  Filled 2023-03-27: qty 1

## 2023-03-27 MED ORDER — VENLAFAXINE HCL ER 37.5 MG PO CP24
37.5000 mg | ORAL_CAPSULE | Freq: Every day | ORAL | Status: DC
  Administered 2023-03-28: 37.5 mg via ORAL
  Filled 2023-03-27: qty 1

## 2023-03-27 MED ORDER — ACETAMINOPHEN 325 MG PO TABS
650.0000 mg | ORAL_TABLET | Freq: Once | ORAL | Status: AC
  Administered 2023-03-27: 650 mg via ORAL
  Filled 2023-03-27: qty 2

## 2023-03-27 NOTE — ED Provider Notes (Signed)
Past Medical History:  Diagnosis Date   Abdominal pain October 20, 2017   Allergy    sneeze, rhinorrhea in am   Anxiety state 09/03/2013   Breast mass, right 10/14/2012   Chicken pox as a child   Chronic pain syndrome 01/23/2015   Chronic renal insufficiency 03/17/2011   Colon cancer ([place])    Complication of anesthesia    agitated when waking up, wakes up shaking, "like  I'm going into shock"   Constipation 2017/10/20   Depression    husband died  3 months ago, pt. admits depression  currently    Diabetic neuropathy ([place]) 03/17/2011   DM (diabetes mellitus) type 2, uncontrolled, with ketoacidosis ([place]) 03/17/2011   Ductal hyperplasia of breast 03/17/2011   Essential hypertension, benign 05/31/2010   Qualifier: Diagnosis of  By: Percival Spanish, MD, Bethann Goo 07/15/2021   Fibromyalgia    GERD (gastroesophageal reflux disease)    H/O echocardiogram    done /w Alpha in 2011, saw Dr. Percival Spanish for tachycardia    Headache    History of colon cancer 03/17/2011   Hyperlipidemia    6 years   Hyperlipidemia, mixed    6 years   Hypertension    15 years, reports she has a rapid heartrate    Loss of weight 12/14/2013   Measles as a child   Osteoarthritis (arthritis due to wear and tear of joints) 03/17/2011   Noted diffusely including neck   Osteopenia 12/14/2013   Pain in finger of right hand 05/15/2017   Pain in joint, lower leg 09/03/2013   Pain of right shoulder region 07/07/2014   Peripheral neuropathy    SOB (shortness of breath) 05/31/2010   Qualifier: Diagnosis of  By: Percival Spanish, MD, Farrel Gordon     Stress 06/01/2013   Tachycardia 05/31/2010   Qualifier: Diagnosis of  By: Percival Spanish, MD, Farrel Gordon     Type 2 diabetes mellitus with peripheral neuropathy ([place]) 03/17/2011   Vitamin B 12 deficiency 07/07/2014   intrinsic factor positive    Physical Exam  BP 132/67   Pulse 91   Temp 98.4 F (36.9 C) (Oral)   Resp 19   Ht 5\' 6"  (1.676 m)   Wt 60.3 kg   SpO2 96%   BMI 21.47 kg/m    Physical Exam  Procedures  Procedures  ED Course / MDM   Received care of patient from Dr. Gilford Raid. Please see her note for prior hx, physical and care. Briefly, this is a 89 year old female with a history of persistent atrial fibrillation, chronic diastolic heart failure, hypertension, hyperlipidemia, chronic renal insufficiency, type 2 diabetes  She was seen recently by cardiology, and declined DCCV or other invasive procedures for treatment of her A-fib--they discussed possible amiodarone.  Chronic diastolic heart failure with her symptoms of dyspnea on exertion, fatigue was discussed at her recent visit and is felt that she would not be able to tolerate Lasix with her blood pressures.  She was then seen by her primary care doctor.  They had ordered labs, and added Lasix 20mg  daily for 3 days starting yesterday.  Family reports she has had frequent falls, bilateral leg swelling.    Labs were ordered by Dr. Lorretta Harp and personally eval and interpreted by me show no sign of urinary tract infection, no leukocytosis, no anemia, no significant electrolyte abnormalities.  Given concern for bilateral lower extremity edema, also ordered a  BNP which is 644, increased from earlier this week.  EKG shows atrial  fibrillation with is persistent.  CT head completed and personally about interpreted by me shows no evidence of intracranial bleed.  X-rays of the pelvis and chest showed no evidence of fracture or pneumothorax.  Chest x-ray does show cardiomegaly and blunting of the lateral costophrenic angles which may be due to minimal effusions or pleural thickening.

## 2023-03-27 NOTE — Assessment & Plan Note (Signed)
Multiple falls at home despite using walker.  No focal deficits.  On chronic anticoagulation with Eliquis.  No obvious injury.  CT head and pelvic x-ray reassuring. -Fall precautions -PT/OT eval

## 2023-03-27 NOTE — ED Triage Notes (Signed)
The patient presents from home due to 10-15 falls in the last few days. She complains of edema in her feet and pain in both her feet and legs.

## 2023-03-27 NOTE — Assessment & Plan Note (Signed)
Renal function is actually improved compared to recent baseline.  Continue to monitor with diuresis.

## 2023-03-27 NOTE — Assessment & Plan Note (Signed)
Remains in atrial fibrillation.  Rate is mostly controlled. -Continue Lopressor 25 mg BID -Continue Eliquis 2.5 mg BID

## 2023-03-27 NOTE — H&P (Signed)
History and Physical    SOLIA FAHL [id number] DOB: [birth year] DOA: 03/27/2023  PCP: Mosie Lukes, MD  Patient coming from: Home  I have personally briefly reviewed patient's old medical records in [place]  Chief Complaint: Exertional dyspnea, frequent falls  HPI: Dana Burns is a 89 y.o. female with medical history significant for atrial fibrillation on Eliquis, HFpEF (EF 60-65%), CKD stage IIIb, T2DM, HTN, depression/anxiety who presented to the ED for evaluation of dyspnea on exertion and frequent falls.  History is supplemented by patient's grandson at bedside.  Patient has noted swelling to both of her feet and lower legs ongoing for the last week.  She has been having difficulty ambulating even with the use of her walker.  She has been falling frequently but fortunately has not had any significant injury.  She does take Eliquis in setting of atrial fibrillation.  She has had exertional dyspnea and fatigue.  She reports decreased urine output over the last 2 days.  She was seen by her PCP in clinic on 3/25.  Due to her symptoms and pedal edema she was prescribed a course of Lasix 20 mg to take for 3 days followed by as needed however she has not been able to fill the prescription and start taking it yet.  She does report improved urine output after receiving IV Lasix while in the ED.  ED Course  Labs/Imaging on admission: I have personally reviewed following labs and imaging studies.  Initial eval showed BP 117/84, pulse 87, RR 16, temp 98.0 F, SpO2 96% on room air.  Labs show WBC 4.3, hemoglobin 12.2, platelets 255,000, sodium 139, potassium 4.6, bicarb 27, BUN 15, creatinine 0.98, serum glucose 90, LFTs within normal limits.  BNP 644.6.  UA negative for UTI.  Pelvic x-ray negative for fracture or dislocation.  Portable chest x-ray shows enlarged cardiac silhouette, blunting of costophrenic angles, increased markings lower lung field suggesting scarring or  subsegmental atelectasis.  CT head without contrast negative for acute intracranial abnormality.  Patient was given IV Lasix 20 mg, IV metoprolol 2.5 mg.  The hospitalist service was consulted to admit for further evaluation and management.  Review of Systems: All systems reviewed and are negative except as documented in history of present illness above.   Past Medical History:  Diagnosis Date   Abdominal pain 10/15/17   Allergy    sneeze, rhinorrhea in am   Anxiety state 09/03/2013   Breast mass, right 10/14/2012   Chicken pox as a child   Chronic pain syndrome 01/23/2015   Chronic renal insufficiency 03/17/2011   Colon cancer (HCC)    Complication of anesthesia    agitated when waking up, wakes up shaking, "like  I'm going into shock"   Constipation 10-15-17   Depression    husband died  3 months ago, pt. admits depression  currently    Diabetic neuropathy ([place]) 03/17/2011   DM (diabetes mellitus) type 2, uncontrolled, with ketoacidosis ([place]) 03/17/2011   Ductal hyperplasia of breast 03/17/2011   Essential hypertension, benign 05/31/2010   Qualifier: Diagnosis of  By: Percival Spanish, MD, Bethann Goo 07/15/2021   Fibromyalgia    GERD (gastroesophageal reflux disease)    H/O echocardiogram    done /w [place] in 2011, saw Dr. Percival Spanish for tachycardia    Headache    History of colon cancer 03/17/2011   Hyperlipidemia    6 years   Hyperlipidemia, mixed    6 years  Hypertension    15 years, reports she has a rapid heartrate    Loss of weight 12/14/2013   Measles as a child   Osteoarthritis (arthritis due to wear and tear of joints) 03/17/2011   Noted diffusely including neck   Osteopenia 12/14/2013   Pain in finger of right hand 05/15/2017   Pain in joint, lower leg 09/03/2013   Pain of right shoulder region 07/07/2014   Peripheral neuropathy    SOB (shortness of breath) 05/31/2010   Qualifier: Diagnosis of  By: Percival Spanish, MD, Farrel Gordon     Stress 06/01/2013   Tachycardia  05/31/2010   Qualifier: Diagnosis of  By: Percival Spanish, MD, Farrel Gordon     Type 2 diabetes mellitus with peripheral neuropathy ([place]) 03/17/2011   Vitamin B 12 deficiency 07/07/2014   intrinsic factor positive    Past Surgical History:  Procedure Laterality Date   BREAST EXCISIONAL BIOPSY Right 2013   benign   BREAST SURGERY  2013   right- benign   CATARACT EXTRACTION     /w IOL- bilateral    COLON SURGERY     For resection of cancer   Nodule removed     From throat    Social History:  reports that she has never smoked. She has never used smokeless tobacco. She reports that she does not drink alcohol and does not use drugs.  Allergies  Allergen Reactions   Tape Itching and Rash   Actos [Pioglitazone Hydrochloride] Other (See Comments)    Unknown reaction   Buspar [Buspirone Hcl] Other (See Comments)    Unknown reaction   Doxycycline Other (See Comments)    Made the patient "feel funny"- in a negative way   Lipitor [Atorvastatin Calcium] Other (See Comments)    "It made me hurt all over."   Penicillins Rash    Family History  Problem Relation Age of Onset   Cancer Mother        Brain   Cancer Father        Colorectal   Cancer Sister        breast   Other Brother        pacemaker   Cancer Brother        pancreatic cancer   Arthritis Sister    Emphysema Brother    Cancer Brother    COPD Brother    Neuropathy Daughter    Fibromyalgia Daughter    Cancer Brother        metastatic colon cancer   Heart disease Neg Hx        Early     Prior to Admission medications   Medication Sig Start Date End Date Taking? Authorizing Provider  acetaminophen (TYLENOL) 500 MG tablet Take 500 mg by mouth every 6 (six) hours as needed for pain.    [provider]  apixaban (ELIQUIS) 2.5 MG TABS tablet Take 1 tablet (2.5 mg total) by mouth 2 (two) times daily. 01/15/23   Hosie Poisson, MD  benzonatate (TESSALON) 200 MG capsule Take 1 capsule (200 mg total) by mouth 3 (three)  times daily as needed for cough. 01/15/23   Hosie Poisson, MD  calcium carbonate (OS-CAL) 600 MG TABS Take 600 mg by mouth 2 (two) times daily with a meal.    [provider]  Cholecalciferol (VITAMIN D3) 50 MCG (2000 UT) capsule Take 2,000 Units by mouth daily.     [provider]  citalopram (CELEXA) 20 MG tablet TAKE 1 TABLET BY MOUTH EVERY  DAY 01/09/23   Mosie Lukes, MD  Cyanocobalamin (B-12 IJ) Place 1,000 mcg under the tongue daily.    [provider]  diazepam (VALIUM) 5 MG tablet TAKE 1/2 TO 1 TABLET DAILY AS NEEDED FOR ANXIETY Patient taking differently: Take 2.5 mg by mouth daily. 11/20/22   Mosie Lukes, MD  famotidine (PEPCID) 40 MG tablet TAKE 1 TABLET BY MOUTH EVERYDAY AT BEDTIME 02/18/23   Mosie Lukes, MD  feeding supplement (ENSURE ENLIVE / ENSURE PLUS) LIQD Take 237 mLs by mouth 2 (two) times daily between meals. 01/16/23 04/16/23  Hosie Poisson, MD  furosemide (LASIX) 20 MG tablet Take 1 tablet (20 mg total) by mouth daily. Take for 3 days, then switch to as needed use for swelling associated with weight gain of 3 pounds in 24 hours or 6 pounds in a week. Let us know if you are having to use frequently so we can monitor kidney function. 03/26/23   Terrilyn Saver, NP  gabapentin (NEURONTIN) 800 MG tablet Take 1 tablet (800 mg total) by mouth 3 (three) times daily. 11/20/22   Mosie Lukes, MD  guaiFENesin-dextromethorphan (ROBITUSSIN DM) 100-10 MG/5ML syrup Take 10 mLs by mouth every 4 (four) hours as needed for cough. 01/15/23   Hosie Poisson, MD  metoprolol tartrate (LOPRESSOR) 25 MG tablet Take 1 tablet twice daily. Please hold medication on the days systolic blood pressure (top number) is less than 90. 03/21/23   Monge, Helane Gunther, NP  mometasone-formoterol (DULERA) 200-5 MCG/ACT AERO Inhale 2 puffs into the lungs 2 (two) times daily. 01/15/23   Hosie Poisson, MD  Multiple Vitamin (MULTIVITAMIN WITH MINERALS) TABS tablet Take 1 tablet by mouth daily.  01/16/23   Hosie Poisson, MD  ondansetron (ZOFRAN-ODT) 4 MG disintegrating tablet Take 1 tablet (4 mg total) by mouth every 8 (eight) hours as needed for nausea or vomiting. 12/03/22   Shelda Pal, DO  rOPINIRole (REQUIP) 1 MG tablet TAKE 1 TABLET BY MOUTH AT BEDTIME. 02/18/23   Mosie Lukes, MD  tiZANidine (ZANAFLEX) 4 MG tablet Take 1 tablet (4 mg total) by mouth 2 (two) times daily as needed for muscle spasms. 08/14/22   Mosie Lukes, MD  venlafaxine XR (EFFEXOR-XR) 37.5 MG 24 hr capsule TAKE 1 CAPSULE BY MOUTH DAILY WITH BREAKFAST. 02/27/23   Mosie Lukes, MD    Physical Exam: Vitals:   03/27/23 1734 03/27/23 1735 03/27/23 2200 03/27/23 2251  BP:  132/67 120/78   Pulse:  91 (!) 121   Resp:  19 17   Temp: 98.4 F (36.9 C)   98.8 F (37.1 C)  TempSrc: Oral   Oral  SpO2:  96% 94%   Weight:      Height:       Constitutional: Elderly woman resting in bed.  NAD, calm, comfortable Eyes: EOMI, lids and conjunctivae normal ENMT: Mucous membranes are moist. Posterior pharynx clear of any exudate or lesions.Normal dentition.  Neck: normal, supple, no masses. Respiratory: Bibasilar inspiratory crackles. Normal respiratory effort. No accessory muscle use.  Cardiovascular: Irregularly irregular, no murmurs / rubs / gallops.  +1 bilateral lower extremity edema. 2+ pedal pulses. Abdomen: no tenderness, no masses palpated. Musculoskeletal: no clubbing / cyanosis. No joint deformity upper and lower extremities. Good ROM, no contractures. Normal muscle tone.  Skin: no rashes, lesions, ulcers. No induration Neurologic: Sensation intact. Strength 5/5 in all 4.  Psychiatric: Normal judgment and insight. Alert and oriented x 3. Normal mood.   EKG:  Personally reviewed. Atrial fibrillation, rate 83, incomplete RBBB and LAFB, low voltage.  Similar to prior.  Assessment/Plan Principal Problem:   Acute on chronic heart failure with preserved ejection fraction (HFpEF) (HCC) Active  Problems:   Chronic atrial fibrillation (HCC)   Frequent falls   Chronic kidney disease, stage 3b (HCC)   Type 2 diabetes mellitus with peripheral neuropathy (HCC)   Depression with anxiety   Right knee pain   Goals of care, counseling/discussion   Dana Burns is a 89 y.o. female with medical history significant for atrial fibrillation on Eliquis, HFpEF (EF 60-65%), CKD stage IIIb, T2DM, HTN, depression/anxiety who is admitted with acute on chronic HFpEF.  Assessment and Plan: * Acute on chronic heart failure with preserved ejection fraction (HFpEF) ([place]) Presenting with 1 week pedal edema, DOE, elevated BNP.  EF is 60-65% by TTE 01/14/2023.  She has been given IV Lasix 20 mg while in the ED.  Has had some issues with low blood pressures recently but currently stable. -Continue IV Lasix 20 mg BID -Continue Lopressor 25 mg BID -Strict I/O's and daily weights  Chronic atrial fibrillation (HCC) Remains in atrial fibrillation.  Rate is mostly controlled. -Continue Lopressor 25 mg BID -Continue Eliquis 2.5 mg BID  Frequent falls Multiple falls at home despite using walker.  No focal deficits.  On chronic anticoagulation with Eliquis.  No obvious injury.  CT head and pelvic x-ray reassuring. -Fall precautions -PT/OT eval  Chronic kidney disease, stage 3b ([place]) Renal function is actually improved compared to recent baseline.  Continue to monitor with diuresis.  Type 2 diabetes mellitus with peripheral neuropathy (HCC) Diet controlled with A1c 6.6%.  Goals of care, counseling/discussion CODE STATUS is DNR, confirmed with patient and her grandson on admission.  Family are requesting consult with palliative medicine options for further assistance at home versus hospice candidacy. -Consult to palliative care  Right knee pain Reports knee pain after one of her falls.  ROM is mildly diminished compared to her left with some minimal swelling at the knee. -Right knee x-ray  Depression  with anxiety Mood is stable.  Resume home meds. -Celexa 20 mg daily -Effexor XR 37.5 mg daily -Valium 5 mg 2.5 mg daily  DVT prophylaxis: apixaban (ELIQUIS) tablet 2.5 mg Start: 03/27/23 2330 apixaban (ELIQUIS) tablet 2.5 mg   Code Status: DNR, confirmed with patient and grandson on admission Family Communication: Grandson at bedside Disposition Plan: From home, dispo pending clinical progress Consults called: Palliative medicine consult placed Severity of Illness: The appropriate patient status for this patient is OBSERVATION. Observation status is judged to be reasonable and necessary in order to provide the required intensity of service to ensure the patient's safety. The patient's presenting symptoms, physical exam findings, and initial radiographic and laboratory data in the context of their medical condition is felt to place them at decreased risk for further clinical deterioration. Furthermore, it is anticipated that the patient will be medically stable for discharge from the hospital within 2 midnights of admission.   Zada Finders MD [facility]  If 7PM-7AM, please contact night-coverage [email or web address]  03/27/2023, 11:21 PM

## 2023-03-27 NOTE — Assessment & Plan Note (Signed)
Mood is stable.  Resume home meds. -Celexa 20 mg daily -Effexor XR 37.5 mg daily -Valium 5 mg 2.5 mg daily

## 2023-03-27 NOTE — Assessment & Plan Note (Signed)
Reports knee pain after one of her falls.  ROM is mildly diminished compared to her left with some minimal swelling at the knee. -Right knee x-ray

## 2023-03-27 NOTE — ED Triage Notes (Signed)
Pt arrived via EMS from home, c/o multiple falls this week, overall pain throughout body from falls. Per EMS, family stated they're in the process of getting her placed in nursing facility

## 2023-03-27 NOTE — Hospital Course (Signed)
Dana Burns is a 89 y.o. female with medical history significant for atrial fibrillation on Eliquis, HFpEF (EF 60-65%), CKD stage IIIb, T2DM, HTN, depression/anxiety who is admitted with acute on chronic HFpEF.

## 2023-03-27 NOTE — Assessment & Plan Note (Signed)
Presenting with 1 week pedal edema, DOE, elevated BNP.  EF is 60-65% by TTE 01/14/2023.  She has been given IV Lasix 20 mg while in the ED.  Has had some issues with low blood pressures recently but currently stable. -Continue IV Lasix 20 mg BID -Continue Lopressor 25 mg BID -Strict I/O's and daily weights

## 2023-03-27 NOTE — Assessment & Plan Note (Signed)
Diet controlled with A1c 6.6%.

## 2023-03-27 NOTE — ED Provider Notes (Signed)
[place] EMERGENCY DEPARTMENT AT [facility] Provider Note   CSN: [id number] Arrival date & time: 03/27/23  1409     History  Chief Complaint  Patient presents with   Dana Burns Dana Burns is a 89 y.o. female.  Pt is a 89 yo female with pmhx significant for neuropathy, fibromyalgia, colon cancer, afib (on eliquis), htn, depression, gerd, hld, dm, chronic pain, oa, ckd, and peripheral edema.  Pt lives at home.  She has been dealing with some swelling to her legs.  She has fallen 10-15 times in the last few days.  She denies hitting her head.  Family wants pt in a NH.  Pt did see her pcp for this problem on 3/25.       Home Medications Prior to Admission medications   Medication Sig Start Date End Date Taking? Authorizing Provider  acetaminophen (TYLENOL) 500 MG tablet Take 500 mg by mouth every 6 (six) hours as needed for pain.    [provider]  apixaban (ELIQUIS) 2.5 MG TABS tablet Take 1 tablet (2.5 mg total) by mouth 2 (two) times daily. 01/15/23   Hosie Poisson, MD  benzonatate (TESSALON) 200 MG capsule Take 1 capsule (200 mg total) by mouth 3 (three) times daily as needed for cough. 01/15/23   Hosie Poisson, MD  calcium carbonate (OS-CAL) 600 MG TABS Take 600 mg by mouth 2 (two) times daily with a meal.    [provider]  Cholecalciferol (VITAMIN D3) 50 MCG (2000 UT) capsule Take 2,000 Units by mouth daily.     [provider]  citalopram (CELEXA) 20 MG tablet TAKE 1 TABLET BY MOUTH EVERY DAY 01/09/23   Mosie Lukes, MD  Cyanocobalamin (B-12 IJ) Place 1,000 mcg under the tongue daily.    [provider]  diazepam (VALIUM) 5 MG tablet TAKE 1/2 TO 1 TABLET DAILY AS NEEDED FOR ANXIETY Patient taking differently: Take 2.5 mg by mouth daily. 11/20/22   Mosie Lukes, MD  famotidine (PEPCID) 40 MG tablet TAKE 1 TABLET BY MOUTH EVERYDAY AT BEDTIME 02/18/23   Mosie Lukes, MD  feeding supplement (ENSURE ENLIVE / ENSURE PLUS) LIQD  Take 237 mLs by mouth 2 (two) times daily between meals. 01/16/23 04/16/23  Hosie Poisson, MD  furosemide (LASIX) 20 MG tablet Take 1 tablet (20 mg total) by mouth daily. Take for 3 days, then switch to as needed use for swelling associated with weight gain of 3 pounds in 24 hours or 6 pounds in a week. Let us know if you are having to use frequently so we can monitor kidney function. 03/26/23   Terrilyn Saver, NP  gabapentin (NEURONTIN) 800 MG tablet Take 1 tablet (800 mg total) by mouth 3 (three) times daily. 11/20/22   Mosie Lukes, MD  guaiFENesin-dextromethorphan (ROBITUSSIN DM) 100-10 MG/5ML syrup Take 10 mLs by mouth every 4 (four) hours as needed for cough. 01/15/23   Hosie Poisson, MD  metoprolol tartrate (LOPRESSOR) 25 MG tablet Take 1 tablet twice daily. Please hold medication on the days systolic blood pressure (top number) is less than 90. 03/21/23   Monge, Helane Gunther, NP  mometasone-formoterol (DULERA) 200-5 MCG/ACT AERO Inhale 2 puffs into the lungs 2 (two) times daily. 01/15/23   Hosie Poisson, MD  Multiple Vitamin (MULTIVITAMIN WITH MINERALS) TABS tablet Take 1 tablet by mouth daily. 01/16/23   Hosie Poisson, MD  ondansetron (ZOFRAN-ODT) 4 MG disintegrating tablet Take 1 tablet (4 mg total) by  mouth every 8 (eight) hours as needed for nausea or vomiting. 12/03/22   Shelda Pal, DO  rOPINIRole (REQUIP) 1 MG tablet TAKE 1 TABLET BY MOUTH AT BEDTIME. 02/18/23   Mosie Lukes, MD  tiZANidine (ZANAFLEX) 4 MG tablet Take 1 tablet (4 mg total) by mouth 2 (two) times daily as needed for muscle spasms. 08/14/22   Mosie Lukes, MD  venlafaxine XR (EFFEXOR-XR) 37.5 MG 24 hr capsule TAKE 1 CAPSULE BY MOUTH DAILY WITH BREAKFAST. 02/27/23   Mosie Lukes, MD      Allergies    Tape, Actos [pioglitazone hydrochloride], Buspar [buspirone hcl], Doxycycline, Lipitor [atorvastatin calcium], and Penicillins    Review of Systems   Review of Systems  Cardiovascular:  Positive for leg swelling.   All other systems reviewed and are negative.   Physical Exam Updated Vital Signs BP 117/84 (BP Location: Left Arm)   Pulse 87   Temp 98 F (36.7 C) (Oral)   Resp 16   Ht 5\' 6"  (1.676 m)   Wt 60.3 kg   SpO2 96%   BMI 21.47 kg/m  Physical Exam Vitals and nursing note reviewed.  Constitutional:      Appearance: Normal appearance.  HENT:     Head: Normocephalic and atraumatic.     Right Ear: External ear normal.     Left Ear: External ear normal.     Nose: Nose normal.     Mouth/Throat:     Mouth: Mucous membranes are moist.     Pharynx: Oropharynx is clear.  Eyes:     Extraocular Movements: Extraocular movements intact.     Conjunctiva/sclera: Conjunctivae normal.     Pupils: Pupils are equal, round, and reactive to light.  Cardiovascular:     Rate and Rhythm: Normal rate. Rhythm irregular.     Pulses: Normal pulses.     Heart sounds: Normal heart sounds.  Pulmonary:     Effort: Pulmonary effort is normal.     Breath sounds: Normal breath sounds.  Abdominal:     General: Abdomen is flat. Bowel sounds are normal.     Palpations: Abdomen is soft.  Musculoskeletal:     Cervical back: Normal range of motion and neck supple.     Right lower leg: Edema present.     Left lower leg: Edema present.  Skin:    General: Skin is warm.     Capillary Refill: Capillary refill takes less than 2 seconds.  Neurological:     General: No focal deficit present.     Mental Status: She is alert and oriented to person, place, and time.  Psychiatric:        Mood and Affect: Mood normal.        Behavior: Behavior normal.     ED Results / Procedures / Treatments   Labs (all labs ordered are listed, but only abnormal results are displayed) Labs Reviewed  CBC WITH DIFFERENTIAL/PLATELET - Abnormal; Notable for the following components:      Result Value   RDW 16.9 (*)    Lymphs Abs 0.6 (*)    All other components within normal limits  COMPREHENSIVE METABOLIC PANEL - Abnormal;  Notable for the following components:   Calcium 8.8 (*)    Total Protein 5.9 (*)    Albumin 3.3 (*)    GFR, Estimated 53 (*)    All other components within normal limits  URINALYSIS, ROUTINE W REFLEX MICROSCOPIC  BRAIN NATRIURETIC PEPTIDE  CBG MONITORING, ED  EKG EKG Interpretation  Date/Time:  Wednesday March 27 2023 15:48:27 EDT Ventricular Rate:  83 PR Interval:    QRS Duration: 118 QT Interval:  365 QTC Calculation: 429 R Axis:   -61 Text Interpretation: Atrial fibrillation Incomplete RBBB and LAFB Low voltage, precordial leads No significant change since last tracing Confirmed by Isla Pence [phone]) on 03/27/2023 3:55:09 PM  Radiology DG Pelvis 1-2 Views  Result Date: 03/27/2023 CLINICAL DATA:  Trauma, fall EXAM: PELVIS - 1-2 VIEW COMPARISON:  None Available. FINDINGS: No displaced fracture or dislocation is seen. Joint spaces in both hips appear symmetrical. Surgical clips and staples are seen in pelvis. Degenerative changes are noted in lower lumbar spine. IMPRESSION: No fracture or dislocation is seen in the pelvis. Lumbar spondylosis. Electronically Signed   By: Elmer Picker M.D.   On: 03/27/2023 15:22   DG Chest 1 View  Result Date: 03/27/2023 CLINICAL DATA:  Trauma, fall EXAM: CHEST  1 VIEW COMPARISON:  03/13/2023 FINDINGS: Transverse diameter of heart is increased. There are no signs of alveolar pulmonary edema or focal pulmonary consolidation. Increased markings are seen in both lower lung fields. There is blunting of lateral CP angles. There is no pneumothorax. IMPRESSION: Cardiomegaly. Blunting of lateral CP angles may be due to minimal effusions or pleural thickening. Increased markings are seen in the lower lung fields suggesting scarring or subsegmental atelectasis. Electronically Signed   By: Elmer Picker M.D.   On: 03/27/2023 15:20    Procedures Procedures    Medications Ordered in ED Medications  furosemide (LASIX) injection 20 mg (20 mg  Intravenous Given 03/27/23 1556)  acetaminophen (TYLENOL) tablet 650 mg (650 mg Oral Given 03/27/23 1631)    ED Course/ Medical Decision Making/ A&P                             Medical Decision Making Amount and/or Complexity of Data Reviewed Labs: ordered. Radiology: ordered.  Risk OTC drugs. Prescription drug management.   This patient presents to the ED for concern of multiple falls, this involves an extensive number of treatment options, and is a complaint that carries with it a high risk of complications and morbidity.  The differential diagnosis includes infection, multiple trauma, electrolyte abn   Co morbidities that complicate the patient evaluation  neuropathy, fibromyalgia, colon cancer, afib (on eliquis), htn, depression, gerd, hld, dm, chronic pain, oa, ckd, and peripheral edema   Additional history obtained:  Additional history obtained from epic chart review External records from outside source obtained and reviewed including family   Lab Tests:  I Ordered, and personally interpreted labs.  The pertinent results include:  cbc nl, cmp nl, bnp nl   Imaging Studies ordered:  I ordered imaging studies including pelvis, cxr, ct head  I independently visualized and interpreted imaging which showed  CXR: Cardiomegaly. Blunting of lateral CP angles may be due to minimal  effusions or pleural thickening. Increased markings are seen in the  lower lung fields suggesting scarring or subsegmental atelectasis.  Pelvis: No fracture or dislocation is seen in the pelvis. Lumbar  spondylosis.  CT scans pending at shift change. I agree with the radiologist interpretation   Cardiac Monitoring:  The patient was maintained on a cardiac monitor.  I personally viewed and interpreted the cardiac monitored which showed an underlying rhythm of: afib   Medicines ordered and prescription drug management:  I ordered medication including lasix  for leg swelling  Reevaluation  of the patient after these medicines showed that the patient improved I have reviewed the patients home medicines and have made adjustments as needed   Problem List / ED Course:  Multiple falls:  CT still pending; UA still pending.  Pt is signed out to Dr. Barbee Cough at shift change.  If there is nothing to admit pt for, she will need to be a boarder with sw and pt eval.   Reevaluation:  After the interventions noted above, I reevaluated the patient and found that they have :improved   Social Determinants of Health:  Lives at home        Final Clinical Impression(s) / ED Diagnoses Final diagnoses:  Ambulatory dysfunction  Peripheral edema  Fall, initial encounter    Rx / DC Orders ED Discharge Orders     None         Isla Pence, MD 03/27/23 1725

## 2023-03-27 NOTE — Assessment & Plan Note (Signed)
CODE STATUS is DNR, confirmed with patient and her grandson on admission.  Family are requesting consult with palliative medicine options for further assistance at home versus hospice candidacy. -Consult to palliative care

## 2023-03-27 NOTE — ED Notes (Signed)
Spoke with family. Family states "its cold in the hallway and she really needs to be in a room" Explained to family the process of the emergency room and family goes I know how it works but she still needs to be in a room. After trying to continue to talk he goes "I will call some people I got this" and walked away

## 2023-03-28 ENCOUNTER — Encounter (HOSPITAL_COMMUNITY): Payer: Self-pay | Admitting: Internal Medicine

## 2023-03-28 DIAGNOSIS — M25561 Pain in right knee: Secondary | ICD-10-CM

## 2023-03-28 DIAGNOSIS — R296 Repeated falls: Secondary | ICD-10-CM

## 2023-03-28 DIAGNOSIS — G8929 Other chronic pain: Secondary | ICD-10-CM

## 2023-03-28 DIAGNOSIS — Z515 Encounter for palliative care: Secondary | ICD-10-CM

## 2023-03-28 DIAGNOSIS — W19XXXA Unspecified fall, initial encounter: Secondary | ICD-10-CM

## 2023-03-28 DIAGNOSIS — I5033 Acute on chronic diastolic (congestive) heart failure: Secondary | ICD-10-CM | POA: Diagnosis not present

## 2023-03-28 DIAGNOSIS — R609 Edema, unspecified: Secondary | ICD-10-CM | POA: Diagnosis not present

## 2023-03-28 DIAGNOSIS — Z7189 Other specified counseling: Secondary | ICD-10-CM | POA: Diagnosis not present

## 2023-03-28 DIAGNOSIS — R600 Localized edema: Secondary | ICD-10-CM

## 2023-03-28 LAB — CBC
HCT: 39.5 % (ref 36.0–46.0)
Hemoglobin: 12.5 g/dL (ref 12.0–15.0)
MCH: 27.7 pg (ref 26.0–34.0)
MCHC: 31.6 g/dL (ref 30.0–36.0)
MCV: 87.4 fL (ref 80.0–100.0)
Platelets: 251 10*3/uL (ref 150–400)
RBC: 4.52 MIL/uL (ref 3.87–5.11)
RDW: 16.5 % — ABNORMAL HIGH (ref 11.5–15.5)
WBC: 6 10*3/uL (ref 4.0–10.5)
nRBC: 0 % (ref 0.0–0.2)

## 2023-03-28 LAB — BASIC METABOLIC PANEL
Anion gap: 9 (ref 5–15)
BUN: 15 mg/dL (ref 8–23)
CO2: 27 mmol/L (ref 22–32)
Calcium: 8.9 mg/dL (ref 8.9–10.3)
Chloride: 102 mmol/L (ref 98–111)
Creatinine, Ser: 1.02 mg/dL — ABNORMAL HIGH (ref 0.44–1.00)
GFR, Estimated: 51 mL/min — ABNORMAL LOW (ref >60)
Glucose, Bld: 98 mg/dL (ref 70–99)
Potassium: 4.1 mmol/L (ref 3.5–5.1)
Sodium: 138 mmol/L (ref 135–145)

## 2023-03-28 LAB — MAGNESIUM: Magnesium: 2.1 mg/dL (ref 1.7–2.4)

## 2023-03-28 LAB — GLUCOSE, CAPILLARY: Glucose-Capillary: 130 mg/dL — ABNORMAL HIGH (ref 70–99)

## 2023-03-28 MED ORDER — SENNOSIDES-DOCUSATE SODIUM 8.6-50 MG PO TABS: 1.0000 | ORAL_TABLET | Freq: Every evening | ORAL | 0 refills | Status: AC | PRN

## 2023-03-28 MED ORDER — ACETAMINOPHEN 500 MG PO TABS
1000.0000 mg | ORAL_TABLET | Freq: Three times a day (TID) | ORAL | Status: DC
  Administered 2023-03-28: 1000 mg via ORAL
  Filled 2023-03-28: qty 2

## 2023-03-28 MED ORDER — ACETAMINOPHEN 500 MG PO TABS: 1000.0000 mg | ORAL_TABLET | Freq: Three times a day (TID) | ORAL | 0 refills | Status: DC

## 2023-03-28 MED ORDER — FUROSEMIDE 20 MG PO TABS: 20.0000 mg | ORAL_TABLET | Freq: Every day | ORAL | 0 refills | Status: DC

## 2023-03-28 NOTE — Plan of Care (Signed)
  Problem: Education: Goal: Ability to demonstrate management of disease process will improve Outcome: Completed/Met Goal: Ability to verbalize understanding of medication therapies will improve Outcome: Completed/Met Goal: Individualized Educational Video(s) Outcome: Completed/Met   Problem: Activity: Goal: Capacity to carry out activities will improve Outcome: Completed/Met   Problem: Cardiac: Goal: Ability to achieve and maintain adequate cardiopulmonary perfusion will improve Outcome: Completed/Met   Problem: Education: Goal: Knowledge of General Education information will improve Description: Including pain rating scale, medication(s)/side effects and non-pharmacologic comfort measures Outcome: Completed/Met   Problem: Health Behavior/Discharge Planning: Goal: Ability to manage health-related needs will improve Outcome: Completed/Met   Problem: Clinical Measurements: Goal: Ability to maintain clinical measurements within normal limits will improve Outcome: Completed/Met Goal: Will remain free from infection Outcome: Completed/Met Goal: Diagnostic test results will improve Outcome: Completed/Met Goal: Respiratory complications will improve Outcome: Completed/Met Goal: Cardiovascular complication will be avoided Outcome: Completed/Met   Problem: Activity: Goal: Risk for activity intolerance will decrease Outcome: Completed/Met   Problem: Nutrition: Goal: Adequate nutrition will be maintained Outcome: Completed/Met   Problem: Coping: Goal: Level of anxiety will decrease Outcome: Completed/Met   Problem: Elimination: Goal: Will not experience complications related to bowel motility Outcome: Completed/Met Goal: Will not experience complications related to urinary retention Outcome: Completed/Met   Problem: Pain Managment: Goal: General experience of comfort will improve Outcome: Completed/Met   Problem: Safety: Goal: Ability to remain free from injury will  improve Outcome: Completed/Met   Problem: Skin Integrity: Goal: Risk for impaired skin integrity will decrease Outcome: Completed/Met   

## 2023-03-28 NOTE — Evaluation (Signed)
Physical Therapy Evaluation Patient Details Name: Dana Burns MRN: [id number] DOB: 1926-05-26 Today's Date: 03/28/2023  History of Present Illness  Pt is 89 yo female admitted 03/27/23 with frequent falls (10-15 past few days) and acute on chronic CHF. Pt with hx including but not limited to atrial fibrillation on Eliquis, HFpEF (EF 60-65%), CKD stage IIIb, T2DM, peripheral neuropathy, HTN, depression/anxiety  Clinical Impression  Pt admitted with above diagnosis. At baseline, pt is ambulatory with RW and has 24 hr supervision.  She does not typically have falls but had 10-15 over past week.  Today, pt reports feeling better and eager to return home for her birthday.  She was able to stand with min guard and ambulated 80' steadily with RW and min guard.  Reports feeling better and more steady.  All VSS.  Pt and daughter report they have necessary DME and decline need for HHPT as pt did not do well with HHPT in past and pt motivated to move on her own. Educated on safety and supervision with mobility due to recent falls.   Pt currently with functional limitations due to the deficits listed below (see PT Problem List). Pt will benefit from acute skilled PT to increase their independence and safety with mobility while hospitalized.    Recommendations for follow up therapy are one component of a multi-disciplinary discharge planning process, led by the attending physician.  Recommendations may be updated based on patient status, additional functional criteria and insurance authorization.  Follow Up Recommendations       Assistance Recommended at Discharge Intermittent Supervision/Assistance  Patient can return home with the following  A little help with walking and/or transfers;A little help with bathing/dressing/bathroom;Help with stairs or ramp for entrance;Assistance with cooking/housework    Equipment Recommendations None recommended by PT  Recommendations for Other Services       Functional  Status Assessment Patient has had a recent decline in their functional status and demonstrates the ability to make significant improvements in function in a reasonable and predictable amount of time.     Precautions / Restrictions Precautions Precautions: Fall      Mobility  Bed Mobility               General bed mobility comments: in chair    Transfers Overall transfer level: Needs assistance Equipment used: Rolling walker (2 wheels) Transfers: Sit to/from Stand Sit to Stand: Min guard           General transfer comment: Reports daughter has to occasionally help her stand    Ambulation/Gait Ambulation/Gait assistance: Min guard Gait Distance (Feet): 80 Feet Assistive device: Rolling walker (2 wheels) Gait Pattern/deviations: Step-through pattern Gait velocity: decreased     General Gait Details: Steady gait with RW; slight decrease in foot clearence -improved with cues  Stairs            Wheelchair Mobility    Modified Rankin (Stroke Patients Only)       Balance Overall balance assessment: Needs assistance Sitting-balance support: No upper extremity supported Sitting balance-Leahy Scale: Good     Standing balance support: No upper extremity supported, Bilateral upper extremity supported Standing balance-Leahy Scale: Fair Standing balance comment: RW to ambulate but able to static stand without support                             Pertinent Vitals/Pain Pain Assessment Pain Assessment: Faces Faces Pain Scale: Hurts a little bit Pain Location:  R knee and calf "sore from falls and crawling"; reports improvement in pain Pain Descriptors / Indicators: Sore Pain Intervention(s): Limited activity within patient's tolerance, Monitored during session    Home Living Family/patient expects to be discharged to:: Private residence Living Arrangements: Children Available Help at Discharge: Family;Available 24 hours/day Type of Home:  House Home Access: Stairs to enter Entrance Stairs-Rails: None Entrance Stairs-Number of Steps: 3 (1 over threshold, then 2 up to main level)   Home Layout: One level Home Equipment: Conservation officer, nature (2 wheels);Cane - single point;BSC/3in1      Prior Function Prior Level of Function : Independent/Modified Independent             Mobility Comments: Walks in home mostly, occasional short community distance, all with RW.  10-15 falls in past few days but prior to that no falls past year. ADLs Comments: Supervision ADLs     Hand Dominance        Extremity/Trunk Assessment   Upper Extremity Assessment Upper Extremity Assessment:  (ROM WFL; MMT grossly 4/5 throughout)    Lower Extremity Assessment Lower Extremity Assessment:  (ROM WFL; MMT grossly 4/5 throughout; pitting edema bil LE)    Cervical / Trunk Assessment Cervical / Trunk Assessment: Normal  Communication   Communication: HOH  Cognition Arousal/Alertness: Awake/alert Behavior During Therapy: WFL for tasks assessed/performed Overall Cognitive Status: Within Functional Limits for tasks assessed                                          General Comments General comments (skin integrity, edema, etc.): VSS; Daughter present throughout session.  Reports pt did not do well with HHPT last time and that she will stay moving and will have assist as needed at home.    Exercises     Assessment/Plan    PT Assessment Patient needs continued PT services  PT Problem List Decreased strength;Cardiopulmonary status limiting activity;Decreased activity tolerance;Decreased balance;Decreased mobility;Decreased knowledge of precautions;Decreased safety awareness;Decreased knowledge of use of DME       PT Treatment Interventions DME instruction;Therapeutic exercise;Gait training;Stair training;Functional mobility training;Therapeutic activities;Patient/family education;Balance training    PT Goals (Current goals  can be found in the Care Plan section)  Acute Rehab PT Goals Patient Stated Goal: return home ASAP PT Goal Formulation: With patient/family Time For Goal Achievement: 04/11/23 Potential to Achieve Goals: Good    Frequency Min 3X/week     Co-evaluation               AM-PAC PT "6 Clicks" Mobility  Outcome Measure Help needed turning from your back to your side while in a flat bed without using bedrails?: None Help needed moving from lying on your back to sitting on the side of a flat bed without using bedrails?: A Little Help needed moving to and from a bed to a chair (including a wheelchair)?: A Little Help needed standing up from a chair using your arms (e.g., wheelchair or bedside chair)?: A Little Help needed to walk in hospital room?: A Little Help needed climbing 3-5 steps with a railing? : A Little 6 Click Score: 19    End of Session Equipment Utilized During Treatment: Gait belt Activity Tolerance: Patient tolerated treatment well Patient left: with call bell/phone within reach;in chair;with family/visitor present Nurse Communication: Mobility status PT Visit Diagnosis: Other abnormalities of gait and mobility (R26.89);Muscle weakness (generalized) (M62.81);History of falling (Z91.81)  Time: [id number] PT Time Calculation (min) (ACUTE ONLY): 16 min   Charges:   PT Evaluation $PT Eval Low Complexity: 1 Low          Abu Heavin, PT Acute Rehab [facility] Re[facility] [phone]   Karlton Lemon 03/28/2023, 12:15 PM

## 2023-03-28 NOTE — TOC Transition Note (Signed)
Transition of Care [facility]) - CM/SW Discharge Note   Patient Details  Name: Dana Burns MRN: [id number] Date of Birth: [birth year]  [facility] CM/SW Contact:  Illene Regulus, LCSW Phone Number: 03/28/2023, 1:16 PM   Clinical Narrative:    CSW received consult for HH/DME , pt was not recommended. No additional needs. TOC sign off.            Patient Goals and CMS Choice      Discharge Placement                         Discharge Plan and Services Additional resources added to the After Visit Summary for                                       Social Determinants of Health (SDOH) Interventions SDOH Screenings   Food Insecurity: No Food Insecurity (03/28/2023)  Housing: Low Risk  (03/28/2023)  Transportation Needs: No Transportation Needs (03/28/2023)  Utilities: Not At Risk (03/28/2023)  Alcohol Screen: Low Risk  (03/26/2023)  Depression (PHQ2-9): Low Risk  (03/26/2023)  Financial Resource Strain: Low Risk  (03/01/2022)  Physical Activity: Inactive (03/01/2022)  Social Connections: Socially Isolated (03/26/2023)  Stress: No Stress Concern Present (03/01/2022)  Tobacco Use: Low Risk  (03/28/2023)     Readmission Risk Interventions     No data to display

## 2023-03-28 NOTE — Consult Note (Signed)
Consultation Note Date: 03/28/2023   Patient Name: DEANGELA BOGUCKI  DOB: May 25, 1926  MRN: [id number]  Age / Sex: 89 y.o., female   PCP: Mosie Lukes, MD Referring Physician: Bonnell Public, MD  Reason for Consultation: Establishing goals of care     Chief Complaint/History of Present Illness:   Patient is a 89 year old female with a past medical history of A-fib on Eliquis, HFpEF, CKD stage III, type 2 diabetes mellitus, hypertension, and depression/anxiety who was admitted on 03/27/2023 for evaluation of dyspnea and frequent falls.  Patient had recently been seen by her PCP on 325 and had been prescribed Lasix for management of pedal edema though patient had not been able to fill this yet.  Imaging has not shown any fractures from falls.  Patient admitted for management of acute on chronic heart failure.  Palliative medicine team consulted to assist with complex medical decision making.  Extensive review of EMR prior to seeing patient.  Patient has recently established home palliative care visit through [facility].   Presented to bedside to meet with patient.  Patient seen sitting up in bedside chair comfortably.  Patient's daughter at bedside.  Patient gave permission to continue with conversation with daughter present.  Introduced myself and the role of the palliative medicine team in patient's care.  Patient able to discuss that she begrudgingly was brought into the hospital after a fall at home.  She notes she has been having more falls at home though just feels unbalanced at times.  Patient is supposed to be regularly using her walker.  Patient feels that she is doing well at this time and would like to go home to celebrate her birthday which is today.  Acknowledged this and would inform hospitalist of patient's desire to be discharged as soon as medically possible.  Spent time hearing about patient's life at home.  Patient lives with her daughter.  Patient is mostly independent with her ADLs  and IADLs.  Patient stays fairly active.  Patient notes she has an overall good diet.  Patient hoping to continue to stay active as long as she can.  Acknowledged patient's wishes for care.  Patient's only concern was her chronic leg pain which she noted is improved by Tylenol.  Noted recommendation to be on scheduled Tylenol 1000 mg 3 times a day then to assist with pain management as to not inhibit her movement and decrease chances of falls.  Patient and daughter agreeing with this plan.  Spent time explaining palliative care versus hospice care.  At this time based on patient's description of life at home, patient is appropriate continue with home palliative care as already scheduled through [facility].  Noted [facility] would be able to assist with continued evaluations to help with determining when appropriate to involve hospice care.  Patient still finding benefit from following up with PCP and plans to continue to do so which again confirmed desire for palliative medicine and not hospice level of care.   Spent time providing emotional support via active listening.  Patient very anxious to get out of the hospital as soon as possible to celebrate her birthday with family.  Acknowledged this and would inform care team of this.  Thanked patient and daughter for allowing me to visit with her today.  Updated care team regarding conversation. Also informed [place] liaison of patient's admission to assist with coordinating further home palliative medicine visits.   Primary Diagnoses  Present on Admission:  Acute on chronic heart failure with  preserved ejection fraction (HFpEF) (HCC)  Chronic atrial fibrillation (HCC)  Type 2 diabetes mellitus with peripheral neuropathy (HCC)  Chronic kidney disease, stage 3b ([place])  Depression with anxiety  Right knee pain   Past Medical History:  Diagnosis Date   Abdominal pain Oct 05, 2017   Allergy    sneeze, rhinorrhea in am   Anxiety state 09/03/2013   Breast mass, right  10/14/2012   Chicken pox as a child   Chronic pain syndrome 01/23/2015   Chronic renal insufficiency 03/17/2011   Colon cancer (HCC)    Complication of anesthesia    agitated when waking up, wakes up shaking, "like  I'm going into shock"   Constipation 2017-10-05   Depression    husband died  3 months ago, pt. admits depression  currently    Diabetic neuropathy ([place]) 03/17/2011   DM (diabetes mellitus) type 2, uncontrolled, with ketoacidosis ([place]) 03/17/2011   Ductal hyperplasia of breast 03/17/2011   Essential hypertension, benign 05/31/2010   Qualifier: Diagnosis of  By: Percival Spanish, MD, Bethann Goo 07/15/2021   Fibromyalgia    GERD (gastroesophageal reflux disease)    H/O echocardiogram    done /w [place] in 2011, saw Dr. Percival Spanish for tachycardia    Headache    History of colon cancer 03/17/2011   Hyperlipidemia    6 years   Hyperlipidemia, mixed    6 years   Hypertension    15 years, reports she has a rapid heartrate    Loss of weight 12/14/2013   Measles as a child   Osteoarthritis (arthritis due to wear and tear of joints) 03/17/2011   Noted diffusely including neck   Osteopenia 12/14/2013   Pain in finger of right hand 05/15/2017   Pain in joint, lower leg 09/03/2013   Pain of right shoulder region 07/07/2014   Peripheral neuropathy    SOB (shortness of breath) 05/31/2010   Qualifier: Diagnosis of  By: Percival Spanish, MD, Farrel Gordon     Stress 06/01/2013   Tachycardia 05/31/2010   Qualifier: Diagnosis of  By: Percival Spanish, MD, Farrel Gordon     Type 2 diabetes mellitus with peripheral neuropathy ([place]) 03/17/2011   Vitamin B 12 deficiency 07/07/2014   intrinsic factor positive   Social History   Socioeconomic History   Marital status: Widowed    Spouse name: Not on file   Number of children: 1   Years of education: 9   Highest education level: Not on file  Occupational History   Occupation: Retired  Tobacco Use   Smoking status: Never   Smokeless tobacco: Never  Substance  and Sexual Activity   Alcohol use: No   Drug use: No   Sexual activity: Never    Comment: lives with daughter. no dietary restrictions  Other Topics Concern   Not on file  Social History Narrative   Patient lives at home with daughter.    Patient is retired.    Patient is widowed.    Patient has 1 child.    Patient has a 9th grade education.    Social Determinants of Health   Financial Resource Strain: Low Risk  (03/01/2022)   Overall Financial Resource Strain (CARDIA)    Difficulty of Paying Living Expenses: Not hard at all  Food Insecurity: No Food Insecurity (03/28/2023)   Hunger Vital Sign    Worried About Running Out of Food in the Last Year: Never true    Ran Out of Food in the Last Year: Never  true  Transportation Needs: No Transportation Needs (03/28/2023)   PRAPARE - Hydrologist (Medical): No    Lack of Transportation (Non-Medical): No  Physical Activity: Inactive (03/01/2022)   Exercise Vital Sign    Days of Exercise per Week: 0 days    Minutes of Exercise per Session: 0 min  Stress: No Stress Concern Present (03/01/2022)   [place]    Feeling of Stress : Not at all  Social Connections: Socially Isolated (03/26/2023)   Social Connection and Isolation Panel [NHANES]    Frequency of Communication with Friends and Family: More than three times a week    Frequency of Social Gatherings with Friends and Family: More than three times a week    Attends Religious Services: Never    Marine scientist or Organizations: No    Attends Archivist Meetings: Never    Marital Status: Widowed   Family History  Problem Relation Age of Onset   Cancer Mother        Brain   Cancer Father        Colorectal   Cancer Sister        breast   Other Brother        pacemaker   Cancer Brother        pancreatic cancer   Arthritis Sister    Emphysema Brother    Cancer Brother     COPD Brother    Neuropathy Daughter    Fibromyalgia Daughter    Cancer Brother        metastatic colon cancer   Heart disease Neg Hx        Early   Scheduled Meds:  apixaban  2.5 mg Oral BID   citalopram  20 mg Oral Daily   diazepam  2.5 mg Oral Daily   famotidine  40 mg Oral QHS   furosemide  20 mg Intravenous Q12H   gabapentin  800 mg Oral TID   metoprolol tartrate  25 mg Oral BID   rOPINIRole  1 mg Oral QHS   sodium chloride flush  3 mL Intravenous Q12H   venlafaxine XR  37.5 mg Oral Daily   Continuous Infusions: PRN Meds:.acetaminophen **OR** acetaminophen, ondansetron **OR** ondansetron (ZOFRAN) IV, senna-docusate Allergies  Allergen Reactions   Tape Itching and Rash   Actos [Pioglitazone Hydrochloride] Other (See Comments)    Unknown reaction   Buspar [Buspirone Hcl] Other (See Comments)    Unknown reaction   Doxycycline Other (See Comments)    Made the patient "feel funny"- in a negative way   Lipitor [Atorvastatin Calcium] Other (See Comments)    "It made me hurt all over."   Penicillins Rash   CBC:    Component Value Date/Time   WBC 6.0 03/28/2023 0430   HGB 12.5 03/28/2023 0430   HCT 39.5 03/28/2023 0430   PLT 251 03/28/2023 0430   MCV 87.4 03/28/2023 0430   MCV 80.1 06/21/2014 1457   NEUTROABS 3.3 03/27/2023 1440   LYMPHSABS 0.6 (L) 03/27/2023 1440   MONOABS 0.4 03/27/2023 1440   EOSABS 0.1 03/27/2023 1440   BASOSABS 0.0 03/27/2023 1440   Comprehensive Metabolic Panel:    Component Value Date/Time   NA 138 03/28/2023 0430   NA 142 06/21/2014 1449   K 4.1 03/28/2023 0430   CL 102 03/28/2023 0430   CO2 27 03/28/2023 0430   BUN 15 03/28/2023 0430   BUN 20 06/21/2014  1449   CREATININE 1.02 (H) 03/28/2023 0430   CREATININE 1.47 (H) 08/22/2020 1321   GLUCOSE 98 03/28/2023 0430   CALCIUM 8.9 03/28/2023 0430   AST 19 03/27/2023 1440   ALT 15 03/27/2023 1440   ALKPHOS 51 03/27/2023 1440   BILITOT 0.6 03/27/2023 1440   PROT 5.9 (L) 03/27/2023 1440    PROT 6.5 06/21/2014 1449   ALBUMIN 3.3 (L) 03/27/2023 1440   ALBUMIN 4.3 06/21/2014 1449    Physical Exam: Vital Signs: BP 122/76 (BP Location: Right Arm)   Pulse (!) 41   Temp 99.1 F (37.3 C) (Oral)   Resp 18   Ht 5\' 6"  (1.676 m)   Wt 59.8 kg   SpO2 90%   BMI 21.28 kg/m  SpO2: SpO2: 90 % O2 Device: O2 Device: Room Air O2 Flow Rate:   Intake/output summary: No intake or output data in the 24 hours ending 03/28/23 0914 LBM: Last BM Date : 03/27/23 Baseline Weight: Weight: 60.3 kg Most recent weight: Weight: 59.8 kg  General: NAD, alert, pleasant, sitting up in bedside chair Eyes: No drainage noted HENT: moist mucous membranes Cardiovascular: RRR, slight edema in LE b/l Respiratory: no increased work of breathing noted, not in respiratory distress Abdomen: not distended Extremities: Moving all appropriately Skin: healing ecchymoses on right knee from fall Neuro: A&Ox4, following commands easily Psych: appropriately answers all questions  Palliative Performance Scale: 70%              Additional Data Reviewed: Recent Labs    03/27/23 1440 03/28/23 0430  WBC 4.3 6.0  HGB 12.2 12.5  PLT 255 251  NA 139 138  BUN 15 15  CREATININE 0.98 1.02*    Imaging: DG Knee Right Port CLINICAL DATA:  Knee pain recent fall  EXAM: PORTABLE RIGHT KNEE - 1-2 VIEW  COMPARISON:  None Available.  FINDINGS: No fracture or malalignment.  Trace knee effusion  IMPRESSION: No acute osseous abnormality. Trace knee effusion.  Electronically Signed   By: Donavan Foil M.D.   On: 03/27/2023 23:28 CT Head Wo Contrast CLINICAL DATA:  Mental status change  EXAM: CT HEAD WITHOUT CONTRAST  TECHNIQUE: Contiguous axial images were obtained from the base of the skull through the vertex without intravenous contrast.  RADIATION DOSE REDUCTION: This exam was performed according to the departmental dose-optimization program which includes automated exposure control, adjustment of  the mA and/or kV according to patient size and/or use of iterative reconstruction technique.  COMPARISON:  None Available.  FINDINGS: Brain: No evidence of acute infarction, hemorrhage, hydrocephalus, extra-axial collection or mass lesion/mass effect. There is mild diffuse atrophy.  Vascular: Atherosclerotic calcifications are present within the cavernous internal carotid arteries.  Skull: Normal. Negative for fracture or focal lesion.  Sinuses/Orbits: No acute finding.  Other: None.  IMPRESSION: No acute intracranial abnormality.  Electronically Signed   By: Ronney Asters M.D.   On: 03/27/2023 18:40 DG Pelvis 1-2 Views CLINICAL DATA:  Trauma, fall  EXAM: PELVIS - 1-2 VIEW  COMPARISON:  None Available.  FINDINGS: No displaced fracture or dislocation is seen. Joint spaces in both hips appear symmetrical. Surgical clips and staples are seen in pelvis. Degenerative changes are noted in lower lumbar spine.  IMPRESSION: No fracture or dislocation is seen in the pelvis. Lumbar spondylosis.  Electronically Signed   By: Elmer Picker M.D.   On: 03/27/2023 15:22 DG Chest 1 View CLINICAL DATA:  Trauma, fall  EXAM: CHEST  1 VIEW  COMPARISON:  03/13/2023  FINDINGS: Transverse diameter of heart is increased. There are no signs of alveolar pulmonary edema or focal pulmonary consolidation. Increased markings are seen in both lower lung fields. There is blunting of lateral CP angles. There is no pneumothorax.  IMPRESSION: Cardiomegaly. Blunting of lateral CP angles may be due to minimal effusions or pleural thickening. Increased markings are seen in the lower lung fields suggesting scarring or subsegmental atelectasis.  Electronically Signed   By: Elmer Picker M.D.   On: 03/27/2023 15:20    I personally reviewed recent imaging.   Palliative Care Assessment and Plan Summary of Established Goals of Care and Medical Treatment Preferences   Patient  is a 89 year old female with a past medical history of A-fib on Eliquis, HFpEF, CKD stage III, type 2 diabetes mellitus, hypertension, and depression/anxiety who was admitted on 03/27/2023 for evaluation of dyspnea and frequent falls.  Patient had recently been seen by her PCP on 325 and had been prescribed Lasix for management of pedal edema though patient had not been able to fill this yet.  Imaging has not shown any fractures from falls.  Patient admitted for management of acute on chronic heart failure.  Palliative medicine team consulted to assist with complex medical decision making.  # Complex medical decision making/goals of care  -Extensive conversation with patient and her daughter at bedside as described in detail above in HPI.  Explained differences between palliative and hospice care.  At this time patient appropriate to continue with home palliative involvement through [facility].  Patient continues to find benefit from following up with PCP and further medical interventions.  Patient hopes to remain as active as possible for as long as possible.  Patient looking forward to celebrating her birthday today.  -Updated [facility] liaison regarding admission for home palliative follow-up.  Recommended [facility] palliative medicine team follow-up regarding DNR form completion and ACP documentation completion.  -  Code Status: DNR   # Symptom management  -Pain, chronic in LE b/l   -Start Tylenol 1000mg  q8hrs during the day   -Patient has components of chronic venous insufficiency in addition to HFpEF.  Could consider use of compression stockings.  Did counsel patient on need to elevate lower extremities.  # Psycho-social/Spiritual Support:  - Support System: daughter, grandson  # Discharge Planning:   Home with home palliative visits through [facility] (already established)  Thank you for allowing the palliative care team to participate in the care [name]. Goals for medical care currently determined and patient  hopeful to get discharged soon. Please reach out if acute PMT needs arise.   Chelsea Aus, DO Palliative Care Provider PMT # [phone]  This provider spent a total of 82 minutes providing patient's care.  Includes review of EMR, discussing care with other staff members involved in patient's medical care, obtaining relevant history and information from patient and/or patient's family, and personal review of imaging and lab work. Greater than 50% of the time was spent counseling and coordinating care related to the above assessment and plan.    *Please note that this is a verbal dictation therefore any spelling or grammatical errors are due to the "[employer]" system interpretation.

## 2023-03-28 NOTE — Discharge Summary (Signed)
Physician Discharge Summary  Patient ID: Dana SitesDoris M Burns MRN: [id number] DOB/AGE: [birth year] 89 y.o.  Admit date: 03/27/2023 Discharge date: 03/28/2023  Admission Diagnoses:  Discharge Diagnoses:  Principal Problem:   Acute on chronic heart failure with preserved ejection fraction (HFpEF) (HCC) Active Problems:   Type 2 diabetes mellitus with peripheral neuropathy (HCC)   Depression with anxiety   Chronic atrial fibrillation (HCC)   Frequent falls   Chronic kidney disease, stage 3b (HCC)   Right knee pain   Goals of care, counseling/discussion   Peripheral edema   Counseling and coordination of care   Palliative care encounter   Discharged Condition: stable  Hospital Course: Patient is a 89 year old female with past medical history significant for atrial fibrillation on Eliquis, heart failure with preserved ejection fraction, chronic kidney disease stage IIIb, type 2 diabetes mellitus pretension.  Patient was admitted with acute on chronic congestive heart failure.  Apparently, prior to presentation, patient was noted to have worsening symptoms and pedal edema.  Patient was prescribed oral diuretics.  Patient could not fill the prescription.  Patient was admitted and managed for acute on chronic CHF.  Patient has improved significantly.  Patient be discharged back home to the care of the primary care provider.   Acute on chronic heart failure with preserved ejection fraction (HFpEF) (HCC) Presenting with 1 week pedal edema, DOE, elevated BNP.  EF is 60-65% by TTE 01/14/2023.  She has been given IV Lasix 20 mg while in the ED.  Has had some issues with low blood pressures recently but currently stable. -Continue IV Lasix 20 mg BID -Continue Lopressor 25 mg BID -Strict I/O's and daily weights   Chronic atrial fibrillation (HCC) Remains in atrial fibrillation.  Rate is mostly controlled. -Continue Lopressor 25 mg BID -Continue Eliquis 2.5 mg BID   Frequent falls Multiple falls at  home despite using walker.  No focal deficits.  On chronic anticoagulation with Eliquis.  No obvious injury.  CT head and pelvic x-ray reassuring. -Fall precautions -PT/OT eval   Chronic kidney disease, stage 3b (HCC) Renal function is actually improved compared to recent baseline.  Continue to monitor with diuresis.   Type 2 diabetes mellitus with peripheral neuropathy (HCC) Diet controlled with A1c 6.6%.  Consults:  Palliative care team   Discharge Exam: Blood pressure 103/76, pulse (!) 104, temperature 98.5 F (36.9 C), temperature source Oral, resp. rate 19, height 5\' 6"  (1.676 m), weight 59.8 kg, SpO2 93 %.   Disposition: Discharge disposition: 01-Home or Self Care       Discharge Instructions     Diet - low sodium heart healthy   Complete by: As directed    Increase activity slowly   Complete by: As directed       Allergies as of 03/28/2023       Reactions   Tape Itching, Rash   Actos [pioglitazone Hydrochloride] Other (See Comments)   Unknown reaction   Buspar [buspirone Hcl] Other (See Comments)   Unknown reaction   Doxycycline Other (See Comments)   Made the patient "feel funny"- in a negative way   Lipitor [atorvastatin Calcium] Other (See Comments)   "It made me hurt all over."   Penicillins Rash        Medication List     STOP taking these medications    benzonatate 200 MG capsule Commonly known as: TESSALON   citalopram 20 MG tablet Commonly known as: CELEXA   guaiFENesin-dextromethorphan 100-10 MG/5ML syrup Commonly known as: ROBITUSSIN DM  ondansetron 4 MG disintegrating tablet Commonly known as: ZOFRAN-ODT       TAKE these medications    acetaminophen 500 MG tablet Commonly known as: TYLENOL Take 2 tablets (1,000 mg total) by mouth every 8 (eight) hours. What changed:  how much to take when to take this reasons to take this   apixaban 2.5 MG Tabs tablet Commonly known as: ELIQUIS Take 1 tablet (2.5 mg total) by mouth 2  (two) times daily.   diazepam 5 MG tablet Commonly known as: VALIUM TAKE 1/2 TO 1 TABLET DAILY AS NEEDED FOR ANXIETY What changed:  how much to take how to take this when to take this additional instructions   famotidine 40 MG tablet Commonly known as: PEPCID TAKE 1 TABLET BY MOUTH EVERYDAY AT BEDTIME   feeding supplement Liqd Take 237 mLs by mouth 2 (two) times daily between meals. What changed:  when to take this reasons to take this   furosemide 20 MG tablet Commonly known as: LASIX Take 1 tablet (20 mg total) by mouth daily. Take for 3 days, then switch to as needed use for swelling associated with weight gain of 3 pounds in 24 hours or 6 pounds in a week. Let us know if you are having to use frequently so we can monitor kidney function.   gabapentin 800 MG tablet Commonly known as: NEURONTIN Take 1 tablet (800 mg total) by mouth 3 (three) times daily.   metoprolol tartrate 25 MG tablet Commonly known as: LOPRESSOR Take 1 tablet twice daily. Please hold medication on the days systolic blood pressure (top number) is less than 90. What changed:  how much to take how to take this when to take this additional instructions   mometasone-formoterol 200-5 MCG/ACT Aero Commonly known as: DULERA Inhale 2 puffs into the lungs 2 (two) times daily. What changed:  when to take this reasons to take this   rOPINIRole 1 MG tablet Commonly known as: REQUIP TAKE 1 TABLET BY MOUTH AT BEDTIME.   senna-docusate 8.6-50 MG tablet Commonly known as: Senokot-S Take 1 tablet by mouth at bedtime as needed for up to 15 days for mild constipation.   tiZANidine 4 MG tablet Commonly known as: ZANAFLEX Take 1 tablet (4 mg total) by mouth 2 (two) times daily as needed for muscle spasms. What changed: when to take this   venlafaxine XR 37.5 MG 24 hr capsule Commonly known as: EFFEXOR-XR TAKE 1 CAPSULE BY MOUTH DAILY WITH BREAKFAST. What changed: when to take this   Vitamin D3 50 MCG  (2000 UT) Caps Generic drug: Cholecalciferol Take 2,000 Units by mouth daily.        Time spent: 36 minutes.  SignedBarnetta Chapel 03/28/2023, 12:45 PM

## 2023-04-04 ENCOUNTER — Encounter (HOSPITAL_COMMUNITY): Payer: Self-pay | Admitting: Pharmacy Technician

## 2023-04-04 ENCOUNTER — Emergency Department (HOSPITAL_COMMUNITY)
Admission: EM | Admit: 2023-04-04 | Discharge: 2023-04-04 | Disposition: A | Discharge status: Home / Self Care | Payer: Medicare Other | Attending: Emergency Medicine | Admitting: Emergency Medicine

## 2023-04-04 ENCOUNTER — Telehealth: Payer: Self-pay

## 2023-04-04 ENCOUNTER — Emergency Department (HOSPITAL_COMMUNITY): Payer: Medicare Other

## 2023-04-04 ENCOUNTER — Other Ambulatory Visit: Payer: Self-pay

## 2023-04-04 DIAGNOSIS — R2243 Localized swelling, mass and lump, lower limb, bilateral: Secondary | ICD-10-CM | POA: Diagnosis not present

## 2023-04-04 DIAGNOSIS — R600 Localized edema: Secondary | ICD-10-CM | POA: Diagnosis not present

## 2023-04-04 DIAGNOSIS — I509 Heart failure, unspecified: Secondary | ICD-10-CM | POA: Diagnosis not present

## 2023-04-04 DIAGNOSIS — M7989 Other specified soft tissue disorders: Secondary | ICD-10-CM | POA: Diagnosis not present

## 2023-04-04 DIAGNOSIS — J90 Pleural effusion, not elsewhere classified: Secondary | ICD-10-CM | POA: Diagnosis not present

## 2023-04-04 LAB — BASIC METABOLIC PANEL
Anion gap: 12 (ref 5–15)
BUN: 22 mg/dL (ref 8–23)
CO2: 22 mmol/L (ref 22–32)
Calcium: 8.9 mg/dL (ref 8.9–10.3)
Chloride: 104 mmol/L (ref 98–111)
Creatinine, Ser: 1.15 mg/dL — ABNORMAL HIGH (ref 0.44–1.00)
GFR, Estimated: 44 mL/min — ABNORMAL LOW (ref >60)
Glucose, Bld: 120 mg/dL — ABNORMAL HIGH (ref 70–99)
Potassium: 4.6 mmol/L (ref 3.5–5.1)
Sodium: 138 mmol/L (ref 135–145)

## 2023-04-04 LAB — BRAIN NATRIURETIC PEPTIDE: B Natriuretic Peptide: 559.3 pg/mL — ABNORMAL HIGH (ref 0.0–100.0)

## 2023-04-04 NOTE — ED Notes (Signed)
Bw sent to lab. Pt on cm sp02 and nibp call bell in reach.

## 2023-04-04 NOTE — ED Provider Notes (Signed)
[facility] Provider Note   CSN: [id number] Arrival date & time: 04/04/23  1522     History Chief Complaint  Patient presents with   Leg Swelling    HPI Dana Burns is a 89 y.o. female presenting for bilateral lower extremity swelling.  States it has been going on for weeks was seen in the hospital for it last week started on Lasix for congestive heart failure exacerbation with preserved ejection fraction.  Overall symptoms eventually improving but she has residual leg swelling.  Comes in for further diagnostic evaluation and management.  Denies fevers chills nausea vomiting syncope shortness of breath.  She is otherwise ambulatory tolerating p.o. intake.  Most of the history is collected from her daughter..   Patient's recorded medical, surgical, social, medication list and allergies were reviewed in the Snapshot window as part of the initial history.   Review of Systems   Review of Systems  Constitutional:  Negative for chills and fever.  HENT:  Negative for ear pain and sore throat.   Eyes:  Negative for pain and visual disturbance.  Respiratory:  Negative for cough and shortness of breath.   Cardiovascular:  Positive for leg swelling. Negative for chest pain and palpitations.  Gastrointestinal:  Negative for abdominal pain and vomiting.  Genitourinary:  Negative for dysuria and hematuria.  Musculoskeletal:  Negative for arthralgias and back pain.  Skin:  Negative for color change and rash.  Neurological:  Negative for seizures and syncope.  All other systems reviewed and are negative.   Physical Exam Updated Vital Signs BP 111/71   Pulse (!) 106   Temp 97.9 F (36.6 C)   Resp (!) 22   Ht 5\' 6"  (1.676 m)   Wt 59.8 kg   SpO2 94%   BMI 21.28 kg/m  Physical Exam Vitals and nursing note reviewed.  Constitutional:      General: She is not in acute distress.    Appearance: She is well-developed.  HENT:     Head:  Normocephalic and atraumatic.  Eyes:     Conjunctiva/sclera: Conjunctivae normal.  Cardiovascular:     Rate and Rhythm: Normal rate and regular rhythm.     Heart sounds: No murmur heard. Pulmonary:     Effort: Pulmonary effort is normal. No respiratory distress.     Breath sounds: Normal breath sounds.  Abdominal:     General: There is no distension.     Palpations: Abdomen is soft.     Tenderness: There is no abdominal tenderness. There is no right CVA tenderness or left CVA tenderness.  Musculoskeletal:        General: No swelling or tenderness. Normal range of motion.     Cervical back: Neck supple.     Right lower leg: Edema present.     Left lower leg: Edema present.  Skin:    General: Skin is warm and dry.  Neurological:     General: No focal deficit present.     Mental Status: She is alert and oriented to person, place, and time. Mental status is at baseline.     Cranial Nerves: No cranial nerve deficit.      ED Course/ Medical Decision Making/ A&P    Procedures Procedures   Medications Ordered in ED Medications - No data to display  Medical Decision Making:   Patient's history present on his physical exam findings most consistent with peripheral edema.  Physical exam does not reveal any other  focal pathology.  She is ambulatory tolerating p.o. intake in no acute distress.  Screening labs ordered to evaluate for more sinister disease such as developing kidney dysfunction or worsening heart failure exacerbation.  Fortunately no respiratory symptoms at this time.  Chest x-ray and EKG with no focal pathology.  BNP is clearing from her prior, creatinine is only slightly elevated compared to her baseline.  Will escalate patient to 40 mg Lasix daily, educated patient on supportive techniques including elevation and compressive wraps and will have patient follow-up closely with her primary care provider within 48 hours for repeat lab work, ongoing outpatient care management.  No  acute indication for further intervention in emergency room given reassuring evaluation at this time.  Patient discharged with no further acute events.  Disposition:  I have considered need for hospitalization, however, considering all of the above, I believe this patient is stable for discharge at this time.  Patient/family educated about specific return precautions for given chief complaint and symptoms.  Patient/family educated about follow-up with PCP .     Patient/family expressed understanding of return precautions and need for follow-up. Patient spoken to regarding all imaging and laboratory results and appropriate follow up for these results. All education provided in verbal form with additional information in written form. Time was allowed for answering of patient questions. Patient discharged.    Emergency Department Medication Summary:   Medications - No data to display      Clinical Impression: No diagnosis found.   Data Unavailable   Final Clinical Impression(s) / ED Diagnoses Final diagnoses:  None    Rx / DC Orders ED Discharge Orders     None         Tretha Sciara, MD 04/04/23 [id number]

## 2023-04-04 NOTE — Telephone Encounter (Signed)
Pt is been seen at [facility] Emergency Department at [facility]

## 2023-04-04 NOTE — ED Triage Notes (Signed)
Pt here with reports of bilateral lower leg edema that has been worsening. Started on Lasix 20mg  daily without any improvement in symptoms.

## 2023-04-04 NOTE — Discharge Instructions (Signed)
You were seen today for lower extremity swelling.  Overall your lab work is improving, your BNP is improving and your lab work shows no focal abnormalities.  You do have chronic kidney disease but I believe your symptoms would improve if we were to increase your Lasix to 40 mg a day rather than 20 mg and you were to use compressive leg wraps daily. You need to follow-up primary care provider for repeat lab check to ensure no worsening kidney dysfunction within 48 hours and to ensure symptomatic improvement.  Accepted to participate in her care, Tretha Sciara MD

## 2023-04-04 NOTE — ED Provider Triage Note (Signed)
Emergency Medicine Provider Triage Evaluation Note  Dana Burns , a 89 y.o. female  was evaluated in triage.  Pt complains of increased lower extremity swelling over the last 2 weeks.  Patient was seen at the hospital for the same last week and told that she may have some issues with heart failure but this has not yet been diagnosed.  She had some outpatient blood work by her PCP but has not yet received the results of this.  Notes that legs are becoming increasingly swollen and painful.  She is currently on Lasix 20 mg daily but is not noticing any improvement on this.  No recent travel, recent surgery, or history of DVT.  No chest pain, shortness of breath, fever, chills, nausea, vomiting, diarrhea, abdominal pain, or other symptoms.  Review of Systems  Positive: See HPI Negative: See HPI  Physical Exam  BP 126/60 (BP Location: Right Arm)   Pulse (!) 123   Temp 98.9 F (37.2 C) (Oral)   Resp 18   SpO2 99%  Gen:   Awake, no distress   Resp:  Normal effort lungs clear to auscultation MSK:   2+ pitting edema to bilateral lower extremities, no significant increased erythema, weeping, wounds or other skin changes Other:  Tachycardia  Medical Decision Making  Medically screening exam initiated at 3:56 PM.  Appropriate orders placed.  [name] was informed that the remainder of the evaluation will be completed by another provider, this initial triage assessment does not replace that evaluation, and the importance of remaining in the ED until their evaluation is complete.     Suzzette Righter, PA-C 04/04/23 1557

## 2023-04-04 NOTE — Telephone Encounter (Signed)
Initial Comment Caller states she is having trouble walking and her leg/feet are hurting. Pt states she is also having back pain. Translation No Nurse Assessment Nurse: Rolin Barry, RN, Levada Dy Date/Time Eilene Ghazi Time): 04/04/2023 12:55:07 PM Confirm and document reason for call. If symptomatic, describe symptoms. ---Caller states she is having trouble walking and her leg/feet are hurting. Pt states she is also having back pain. Unable to get shoes on. States that they are hurting, has been hurting for several months. No temp. Caller states that she just took 2 tylenol for the pain. Does the patient have any new or worsening symptoms? ---Yes Will a triage be completed? ---Yes Related visit to physician within the last 2 weeks? ---Yes Does the PT have any chronic conditions? (i.e. diabetes, asthma, this includes High risk factors for pregnancy, etc.) ---Yes List chronic conditions. ---[place] last Wednesday [place] everyday last week. Daughter states that she fell last week everyday. Is this a behavioral health or substance abuse call? ---No Guidelines Guideline Title Affirmed Question Affirmed Notes Nurse Date/Time Eilene Ghazi Time) Leg Swelling and Edema [1] Can't walk or can barely walk AND [2] new-onset Rolin Barry, RN, Levada Dy 04/04/2023 12:58:54 PM Disp. Time Eilene Ghazi Time) Disposition Final User 04/04/2023 1:01:50 PM Go to ED Now Yes Deaton, RN, Levada Dy PLEASE NOTE: All timestamps contained within this report are represented as [place] Standard Time. CONFIDENTIALTY NOTICE: This fax transmission is intended only for the addressee. It contains information that is legally privileged, confidential or otherwise protected from use or disclosure. If you are not the intended recipient, you are strictly prohibited from reviewing, disclosing, copying using or disseminating any of this information or taking any action in reliance on or regarding this information. If you have received this fax in error,  please notify us immediately by telephone so that we can arrange for its return to [place]. Phone: [phone], Toll-Free: [phone], Fax: [phone] Page: 2 of 2 Call Id: [id number] Final Disposition 04/04/2023 1:01:50 PM Go to ED Now Yes Rolin Barry, RN, Cindee Lame Disagree/Comply Comply Caller Understands Yes PreDisposition Did not know what to do Care Advice Given Per Guideline GO TO ED NOW: * You need to be seen in the Emergency Department. * Go to the ED at ___________ [place] now. Drive carefully. CARE ADVICE given per Leg Swelling and Edema (Adult) guideline. * The patient, family members, or caregiver can arrange ambulance transport via private ambulance company or via EMS 911. NOTE TO TRIAGER - AMBULANCE TRANSPORT IF UNABLE TO WALK: Comments User: Saverio Danker, RN Date/Time Eilene Ghazi Time): 04/04/2023 1:03:44 PM Daughter is with her mother. Leg swelling is severe, up to her thighs, was in the Hospital this week, has CHF. Caller crying with pain in legs, unable to stand or walk, refused to let daughter call 911, but will let her drive her to the ED . Referrals [facility] - ED

## 2023-04-04 NOTE — ED Notes (Signed)
Ortho in room to wrap legs

## 2023-04-05 ENCOUNTER — Other Ambulatory Visit: Payer: Medicare Other

## 2023-04-05 VITALS — BP 110/72 | HR 90 | Temp 97.9°F

## 2023-04-05 DIAGNOSIS — Z515 Encounter for palliative care: Secondary | ICD-10-CM

## 2023-04-05 NOTE — Progress Notes (Signed)
PATIENT NAME: Dana Burns DOB: 06-28-1926 MRN: [id number]  PRIMARY CARE PROVIDER: Bradd Canary, MD  RESPONSIBLE PARTY:  Acct ID - Guarantor Home Phone Work Phone Relationship Acct Type  [id number] Dana Burns, Dana Burns* [phone]  Self P/F     [address] [place], [place] [address]    Palliative Care Follow Up Encounter Note    Completed home visit.  Daughter [name] also present.       Respiratory: denies SOB for today; decreased breath sounds    Cardiac: BLE edema; irregular heartbeat   Cognitive: alert and oriented; telling jokes   Appetite: increased intake is now 2-3 cups of fluid daily; eating much better; 2-3 reduced meals daily; pt reports feeling hungry these days    GI/GU: urine is no longer dark - it's now yellow; last BM this AM; has a BM every few days; pt states, "I'm hard to move my bowels, I don't know why I've always had a hard time going"   Mobility:  walks with a cane without additional assistance; unsure of how well she will ambulate today   ADLs: dependent   Sleeping Pattern: pt reports some nights she sleeps well and other nights she doesn't; dozes in the day time   Pain: denies pain this visit  Falls: last night (04/04/23) fell between the toilet and the tub, pulled herself up on the bathtub; on Monday (04/01/23) she fell in the kitchen floor   Palliative Care/ Hospice: Discussed benefits of hospice care as well as the differences between the two with patient.    Goals of Care: To stay in the home and not go to the hospital    CODE STATUS: Full Code ADVANCED DIRECTIVES: N MOST FORM: N PPS: 50%   PHYSICAL EXAM:   VITALS: Today's Vitals   04/05/23 1333  BP: 110/72  Pulse: 90  Temp: 97.9 F (36.6 C)  TempSrc: Temporal  SpO2: 94%  PainSc: 0-No pain    LUNGS: clear to auscultation , decreased breath sounds CARDIAC: Cor irreg, irreg RRR EXTREMITIES: AROM x4;  SKIN: cool, dry, blisters forming on R pedal area of her foot NEURO: negative  except for coordination problems, headaches, and weakness       Sedric Guia Clementeen Graham, LPN

## 2023-04-06 ENCOUNTER — Other Ambulatory Visit: Payer: Self-pay

## 2023-04-06 ENCOUNTER — Encounter (HOSPITAL_BASED_OUTPATIENT_CLINIC_OR_DEPARTMENT_OTHER): Payer: Self-pay | Admitting: Pediatrics

## 2023-04-06 ENCOUNTER — Emergency Department (HOSPITAL_BASED_OUTPATIENT_CLINIC_OR_DEPARTMENT_OTHER)
Admission: EM | Admit: 2023-04-06 | Discharge: 2023-04-06 | Disposition: A | Discharge status: Home / Self Care | Payer: Medicare Other | Attending: Emergency Medicine | Admitting: Emergency Medicine

## 2023-04-06 DIAGNOSIS — I509 Heart failure, unspecified: Secondary | ICD-10-CM | POA: Diagnosis not present

## 2023-04-06 DIAGNOSIS — Z7901 Long term (current) use of anticoagulants: Secondary | ICD-10-CM | POA: Diagnosis not present

## 2023-04-06 DIAGNOSIS — E119 Type 2 diabetes mellitus without complications: Secondary | ICD-10-CM | POA: Insufficient documentation

## 2023-04-06 DIAGNOSIS — R600 Localized edema: Secondary | ICD-10-CM | POA: Diagnosis not present

## 2023-04-06 DIAGNOSIS — L309 Dermatitis, unspecified: Secondary | ICD-10-CM | POA: Diagnosis not present

## 2023-04-06 DIAGNOSIS — R21 Rash and other nonspecific skin eruption: Secondary | ICD-10-CM | POA: Diagnosis present

## 2023-04-06 NOTE — ED Provider Notes (Signed)
[place] [facility] DE[facility]MENT AT [facility] Provider Note   CSN: [id number] Arrival date & time: 04/06/23  1434     History  Chief Complaint  Patient presents with   Leg Swelling    SORA VIRUET is a 89 y.o. female, history of heart failure, diabetes, who presents to the ED secondary to rash of the right foot.  She states she has heart failure, and has been up on different diuretics, to increase her fluid loss, she states that is been working well, and she has had less swelling, but today the home nurse looked at her right foot, and was concerned that she was developing cellulitis so sent her to the ER.  She denies any redness, of the area, worsening swelling, but does endorse a little bit more tenderness to the side.  States that there is a little bit of a pebbly rash there.  Denies any fevers or chills.    Home Medications Prior to Admission medications   Medication Sig Start Date End Date Taking? Authorizing Provider  acetaminophen (TYLENOL) 500 MG tablet Take 2 tablets (1,000 mg total) by mouth every 8 (eight) hours. 03/28/23   Barnetta Chapel, MD  apixaban (ELIQUIS) 2.5 MG TABS tablet Take 1 tablet (2.5 mg total) by mouth 2 (two) times daily. 01/15/23   Kathlen Mody, MD  Cholecalciferol (VITAMIN D3) 50 MCG (2000 UT) capsule Take 2,000 Units by mouth daily.     [provider]  diazepam (VALIUM) 5 MG tablet TAKE 1/2 TO 1 TABLET DAILY AS NEEDED FOR ANXIETY Patient taking differently: Take 2.5-5 mg by mouth daily. 11/20/22   Bradd Canary, MD  famotidine (PEPCID) 40 MG tablet TAKE 1 TABLET BY MOUTH EVERYDAY AT BEDTIME Patient not taking: Reported on 03/28/2023 02/18/23   Bradd Canary, MD  feeding supplement (ENSURE ENLIVE / ENSURE PLUS) LIQD Take 237 mLs by mouth 2 (two) times daily between meals. Patient taking differently: Take 237 mLs by mouth as needed (nutrition). 01/16/23 04/16/23  Kathlen Mody, MD  furosemide (LASIX) 20 MG tablet Take 1 tablet (20 mg  total) by mouth daily. Take for 3 days, then switch to as needed use for swelling associated with weight gain of 3 pounds in 24 hours or 6 pounds in a week. Let us know if you are having to use frequently so we can monitor kidney function. 03/28/23 04/27/23  Barnetta Chapel, MD  gabapentin (NEURONTIN) 800 MG tablet Take 1 tablet (800 mg total) by mouth 3 (three) times daily. 11/20/22   Bradd Canary, MD  metoprolol tartrate (LOPRESSOR) 25 MG tablet Take 1 tablet twice daily. Please hold medication on the days systolic blood pressure (top number) is less than 90. Patient taking differently: Take 25 mg by mouth 2 (two) times daily. 03/21/23   Joylene Grapes, NP  mometasone-formoterol (DULERA) 200-5 MCG/ACT AERO Inhale 2 puffs into the lungs 2 (two) times daily. Patient taking differently: Inhale 2 puffs into the lungs as needed for wheezing or shortness of breath. 01/15/23   Kathlen Mody, MD  rOPINIRole (REQUIP) 1 MG tablet TAKE 1 TABLET BY MOUTH AT BEDTIME. 02/18/23   Bradd Canary, MD  senna-docusate (SENOKOT-S) 8.6-50 MG tablet Take 1 tablet by mouth at bedtime as needed for up to 15 days for mild constipation. 03/28/23 04/12/23  Barnetta Chapel, MD  tiZANidine (ZANAFLEX) 4 MG tablet Take 1 tablet (4 mg total) by mouth 2 (two) times daily as needed for muscle spasms. Patient  taking differently: Take 4 mg by mouth at bedtime. 08/14/22   Bradd CanaryBlyth, Stacey A, MD  venlafaxine XR (EFFEXOR-XR) 37.5 MG 24 hr capsule TAKE 1 CAPSULE BY MOUTH DAILY WITH BREAKFAST. Patient taking differently: Take 37.5 mg by mouth daily. 02/27/23   Bradd CanaryBlyth, Stacey A, MD      Allergies    Tape, Actos [pioglitazone hydrochloride], Buspar [buspirone hcl], Doxycycline, Lipitor [atorvastatin calcium], and Penicillins    Review of Systems   Review of Systems  Skin:  Positive for rash. Negative for color change.    Physical Exam Updated Vital Signs BP 120/81 (BP Location: Right Arm)   Pulse 87   Temp 98.2 F (36.8 C) (Oral)    Resp 17   Ht 5\' 6"  (1.676 m)   Wt 59.8 kg   SpO2 95%   BMI 21.28 kg/m  Physical Exam Vitals and nursing note reviewed.  Constitutional:      General: She is not in acute distress.    Appearance: She is well-developed.  HENT:     Head: Normocephalic and atraumatic.  Eyes:     Conjunctiva/sclera: Conjunctivae normal.  Cardiovascular:     Rate and Rhythm: Normal rate and regular rhythm.     Heart sounds: No murmur heard. Pulmonary:     Effort: Pulmonary effort is normal. No respiratory distress.     Breath sounds: Normal breath sounds.  Abdominal:     Palpations: Abdomen is soft.     Tenderness: There is no abdominal tenderness.  Musculoskeletal:     Cervical back: Neck supple.     Right lower leg: 2+ Pitting Edema present.     Left lower leg: 2+ Pitting Edema present.  Skin:    General: Skin is warm and dry.     Capillary Refill: Capillary refill takes less than 2 seconds.     Comments: Mild erythematous papules to the right foot, without warmth.  See picture for further detail  Neurological:     Mental Status: She is alert.  Psychiatric:        Mood and Affect: Mood normal.     ED Results / Procedures / Treatments   Labs (all labs ordered are listed, but only abnormal results are displayed) Labs Reviewed - No data to display  EKG None  Radiology DG Chest 2 View  Result Date: 04/04/2023 CLINICAL DATA:  Lower extremity edema EXAM: CHEST - 2 VIEW COMPARISON:  03/27/2023, CT 01/08/2023 FINDINGS: Trace bilateral effusions. Cardiomegaly. No focal airspace disease. Aortic atherosclerosis. No pneumothorax. IMPRESSION: Cardiomegaly with trace pleural effusions. No edema or focal airspace disease. Electronically Signed   By: Jasmine PangKim  Fujinaga M.D.   On: 04/04/2023 16:54    Procedures Procedures    Medications Ordered in ED Medications - No data to display  ED Course/ Medical Decision Making/ A&P                             Medical Decision Making Patient is a  89 year old female, here for redness to the right foot been going on for the past day.  She states that it is swollen both feet, but has been improving since starting Lasix.  Sent here for due to concerns for cellulitis.  Denies any redness, increased swelling, the foot.  On exam and appears to be dermatitis-like like a cellulitis as there is none erythematous patch.  Is not hot.  She does have good pulses.  Overall well-appearing.  Swelling is symmetrical.  Discharged with close follow-up with PCP.  Staffed with Dr. Jearld Fenton    Final Clinical Impression(s) / ED Diagnoses Final diagnoses:  Peripheral edema  Dermatitis    Rx / DC Orders ED Discharge Orders     None         Kareen Hitsman, Harley [place], PA 04/06/23 1539    Loetta Rough, MD 04/09/23 2150

## 2023-04-06 NOTE — ED Notes (Signed)
Patient's legs were wrapped with ace wrap due to the swelling.

## 2023-04-06 NOTE — Discharge Instructions (Addendum)
Today your findings are concerning for dermatitis secondary to your swelling.  You can use some over-the-counter, hydrocortisone 10, to help with this, however taking your fluid-filled, to help relieve some of the fluid is going to be best for this.  As the swelling decreases the dermatitis will become less bothersome than it..  They becomes more red, swollen, tender please return to the ER.

## 2023-04-06 NOTE — ED Triage Notes (Signed)
Arrived POV; accompanied by daughter. Concern for infection on right lower leg. Stated they were recently seen for edema due to HF.

## 2023-04-06 NOTE — ED Notes (Signed)
Discharge paperwork reviewed entirely with patient, including Rx's and follow up care. Pain was under control. Pt verbalized understanding as well as all parties involved. No questions or concerns voiced at the time of discharge. No acute distress noted.   Pt was wheeled out to the PVA in a wheelchair without incident.  

## 2023-04-09 ENCOUNTER — Telehealth: Payer: Self-pay | Admitting: *Deleted

## 2023-04-09 ENCOUNTER — Encounter: Payer: Self-pay | Admitting: *Deleted

## 2023-04-09 DIAGNOSIS — M5430 Sciatica, unspecified side: Secondary | ICD-10-CM | POA: Insufficient documentation

## 2023-04-09 NOTE — Transitions of Care (Post Inpatient/ED Visit) (Addendum)
04/09/2023  Name: MASAYE DOWNES MRN: [id number] DOB: 1926/11/18  Today's TOC FU Call Status: Today's TOC FU Call Status:: Successful TOC FU Call Competed TOC FU Call Complete Date: 04/09/23  ED EMMI Red Alert notification on 04/08/23 from ED visit 04/06/23- EMMI call placed 04/06/23: "No scheduled follow up"  Transition Care Management Follow-up Telephone Call Date of Discharge: 04/06/23 Discharge Facility: [facility] Type of Discharge: Emergency Department Reason for ED Visit: Cardiac Conditions Cardiac Conditions Diagnosis: Heart Failure (CHF,Heart Failure Diagnosis) (LE swelling) How have you been since you were released from the hospital?: Better (per daughter/ caregiver Dianne: "She is better; she increased the fluid pill like the ER doctor told us to, her swelling seems somewhat better.  We are seeing the NP at Dr. Mariel Aloe office tomorrow, I want to ask her some questions about her medicines") Any questions or concerns?: Yes Patient Questions/Concerns:: questions why Celexa was stopped at time of 03/27/23 OBS admission/ questions ongoing dosing of Lasix post-recent ED visit on 04/06/23 Patient Questions/Concerns Addressed: Notified Provider of Patient Questions/Concerns, Other: (Full medication review)  Items Reviewed: Did you receive and understand the discharge instructions provided?: Yes (thoroughly reviewed with caregiver who verbalizes very good understanding of same) Medications obtained and verified?: Yes (Medications Reviewed) (Full medication review completed; no concerns or discrepancies identified; confirmed patient obtained/ is taking all newly Rx'd medications as instructed; daughter-manages medications and denies questions/ concerns around medications today) Any new allergies since your discharge?: No Dietary orders reviewed?: Yes Type of Diet Ordered:: "Healthy" Do you have support at home?: Yes People in Home: child(ren), adult Name of Support/Comfort Primary  Source: resides with daughter who provides assistance with all care needs as indicated  Home Care and Equipment/Supplies: Were Home Health Services Ordered?: No Any new equipment or medical supplies ordered?: No  Functional Questionnaire: Do you need assistance with bathing/showering or dressing?: Yes (daughter assists) Do you need assistance with meal preparation?: Yes (daughter completes meal prep) Do you need assistance with eating?: No Do you have difficulty maintaining continence: No Do you need assistance with getting out of bed/getting out of a chair/moving?: Yes ("sometimes" per daughter/ caregiver) Do you have difficulty managing or taking your medications?: Yes (daughter manages all medications)  Follow up appointments reviewed: PCP Follow-up appointment confirmed?: Yes Date of PCP follow-up appointment?: 04/10/23 Follow-up Provider: PCP Specialist Hospital Follow-up appointment confirmed?: NA (verified not indicated/ recommended per review of hospital discharging provider notes in EHR) Do you need transportation to your follow-up appointment?: No Do you understand care options if your condition(s) worsen?: Yes-patient verbalized understanding  SDOH Interventions Today    Flowsheet Row Most Recent Value  SDOH Interventions   Food Insecurity Interventions Intervention Not Indicated  Transportation Interventions Intervention Not Indicated  [daughter provides transportation]      TOC Interventions Today    Flowsheet Row Most Recent Value  TOC Interventions   TOC Interventions Discussed/Reviewed TOC Interventions Discussed  [provided my direct contact information should questions/ concerns/ needs arise post-TOC call, prior to RN CM telephone visit]      Interventions Today    Flowsheet Row Most Recent Value  Chronic Disease   Chronic disease during today's visit Congestive Heart Failure (CHF)  General Interventions   General Interventions Discussed/Reviewed General  Interventions Discussed, Doctor Visits, Referral to Nurse, Communication with  Doctor Visits Discussed/Reviewed PCP, Doctor Visits Discussed  PCP/Specialist Visits Compliance with follow-up visit  Communication with PCP/Specialists, RN  Education Interventions   Education Provided Provided Education  Provided Verbal Education On Other  [confirmed [facility] currently active- [facility],  reinforced role of palliative care team]  Nutrition Interventions   Nutrition Discussed/Reviewed Nutrition Discussed  Pharmacy Interventions   Pharmacy Dicussed/Reviewed Pharmacy Topics Discussed  [Full medication review with updating medication list in EHR per patient report]  Safety Interventions   Safety Discussed/Reviewed Safety Discussed, Fall Risk, Home Safety  Home Safety Assistive Devices  [confirmed uses walker,  occasionally daughter has to remind patient to use]      Caryl PinaLaine Mckinney Curren Mohrmann, RN, BSN, CCRN Alumnus RN CM Care Coordination/ Transition of Care- [facility] Care Management [phone]: direct office

## 2023-04-09 NOTE — Progress Notes (Unsigned)
   Acute Office Visit  Subjective:     Patient ID: Dana Burns, female    DOB: 13-Oct-1926, 89 y.o.   MRN: [id number]  No chief complaint on file.   HPI Patient is in today for *** ED follow-up.   On 04/06/23 patient with to the ED for right foot rash x1 day. She reported that her swelling (CHF) actually looked better, but her nurse at home was concerned she may be developing cellulitis so she went to the ED. On exam she has mild erythematous rash to right foot with bilateral lower extremity edema 2+. She was discharged with instructions to monitor.   Two days prior, on 04/04/23 she was in the ED for BLE edema after being hospitalized the week prior for CHF exacerbation.  CXR and EKG stable, BNP improving, Cr minimally elevated compared to baseline. They increased her lasix to 40 mg daily and recommended repeat labs with PCP   *** Today:    ROS All review of systems negative except what is listed in the HPI      Objective:    There were no vitals taken for this visit. {Vitals History (Optional):23777}  Physical Exam  No results found for any visits on 04/10/23.      Assessment & Plan:   Problem List Items Addressed This Visit   None   No orders of the defined types were placed in this encounter.   No follow-ups on file.  Clayborne Dana, NP

## 2023-04-10 ENCOUNTER — Encounter: Payer: Self-pay | Admitting: Family Medicine

## 2023-04-10 ENCOUNTER — Ambulatory Visit (INDEPENDENT_AMBULATORY_CARE_PROVIDER_SITE_OTHER): Payer: Medicare Other | Admitting: Family Medicine

## 2023-04-10 VITALS — BP 118/64 | HR 80 | Ht 66.0 in | Wt 130.0 lb

## 2023-04-10 DIAGNOSIS — I10 Essential (primary) hypertension: Secondary | ICD-10-CM

## 2023-04-10 DIAGNOSIS — N189 Chronic kidney disease, unspecified: Secondary | ICD-10-CM

## 2023-04-10 DIAGNOSIS — R600 Localized edema: Secondary | ICD-10-CM | POA: Diagnosis not present

## 2023-04-10 DIAGNOSIS — I5042 Chronic combined systolic (congestive) and diastolic (congestive) heart failure: Secondary | ICD-10-CM | POA: Diagnosis not present

## 2023-04-10 NOTE — Assessment & Plan Note (Signed)
Blood pressure is at goal for age and co-morbidities.  - BP goal <140/80 - monitor and log blood pressures at home - check around the same time each day in a relaxed setting - Limit salt to <2000 mg/day - Follow DASH eating plan (heart healthy diet) - limit alcohol to 2 standard drinks per day for men and 1 per day for women - avoid tobacco products - get at least 2 hours of regular aerobic exercise weekly Patient aware of signs/symptoms requiring further/urgent evaluation. Labs updated today.

## 2023-04-10 NOTE — Assessment & Plan Note (Signed)
Currently on lasix. Monitor renal function today.

## 2023-04-10 NOTE — Patient Instructions (Addendum)
Continue lasix for now. Checking labs today.  Continue with compression wraps/socks.  Minimize sodium intake.  Elevate legs when seated.  Weigh yourself daily and keep a log. Let us know if you gain more than 3 pounds overnight or 6  pounds in a week.  We will schedule a follow-up pending lab results.

## 2023-04-11 LAB — BASIC METABOLIC PANEL
BUN: 21 mg/dL (ref 6–23)
CO2: 34 mEq/L — ABNORMAL HIGH (ref 19–32)
Calcium: 9.6 mg/dL (ref 8.4–10.5)
Chloride: 97 mEq/L (ref 96–112)
Creatinine, Ser: 1.29 mg/dL — ABNORMAL HIGH (ref 0.40–1.20)
GFR: 35.39 mL/min — ABNORMAL LOW (ref >60.00)
Glucose, Bld: 102 mg/dL — ABNORMAL HIGH (ref 70–99)
Potassium: 4.6 mEq/L (ref 3.5–5.1)
Sodium: 141 mEq/L (ref 135–145)

## 2023-04-11 LAB — BRAIN NATRIURETIC PEPTIDE: Pro B Natriuretic peptide (BNP): 558 pg/mL — ABNORMAL HIGH (ref 0.0–100.0)

## 2023-04-17 ENCOUNTER — Encounter: Payer: Self-pay | Admitting: Family Medicine

## 2023-04-17 ENCOUNTER — Ambulatory Visit (HOSPITAL_BASED_OUTPATIENT_CLINIC_OR_DEPARTMENT_OTHER)
Admission: RE | Admit: 2023-04-17 | Discharge: 2023-04-17 | Disposition: A | Discharge status: Home / Self Care | Payer: Medicare Other | Source: Ambulatory Visit | Attending: Family Medicine | Admitting: Family Medicine

## 2023-04-17 ENCOUNTER — Ambulatory Visit (INDEPENDENT_AMBULATORY_CARE_PROVIDER_SITE_OTHER): Payer: Medicare Other | Admitting: Family Medicine

## 2023-04-17 VITALS — BP 118/72 | HR 88 | Ht 66.0 in | Wt 132.0 lb

## 2023-04-17 DIAGNOSIS — R600 Localized edema: Secondary | ICD-10-CM | POA: Insufficient documentation

## 2023-04-17 NOTE — Patient Instructions (Signed)
Please wear your compression socks and elevate legs when seated

## 2023-04-17 NOTE — Progress Notes (Signed)
Acute Office Visit  Subjective:     Patient ID: Burns Burns, female    DOB: 1926-05-02, 89 y.o.   MRN: [id number]  Chief Complaint  Patient presents with   Edema    HPI Patient is in today for edema follow-up.   Last week patient had reported significant improvement in swelling since ED visit and increasing lasix to 40 mg daily. Labs revealed stable BNP but worsening renal function, so we decreased lasix back to 20 mg daily and stressed the importance of supportive/lifestyle measures. She is here today to recheck edema and renal function.   Today she reports she is about the same. No significant improvement in swelling or discomfort. She also started with some new left calf pain a few days ago - no redness. Admits that she has not been wearing compression or elevating her legs much. No chest pain, dyspnea, wheezing, fevers, chills.    Wt Readings from Last 3 Encounters:  04/17/23 132 lb (59.9 kg)  04/10/23 130 lb (59 kg)  04/06/23 131 lb 13.4 oz (59.8 kg)       ROS All review of systems negative except what is listed in the HPI      Objective:    BP 118/72   Pulse 88   Ht  (1.676 m)   Wt 132 lb (59.9 kg)   SpO2 95%   BMI 21.31 kg/m    Physical Exam Vitals reviewed.  Constitutional:      Appearance: Normal appearance.  Cardiovascular:     Rate and Rhythm: Normal rate. Rhythm irregular.  Pulmonary:     Effort: Pulmonary effort is normal.     Breath sounds: Normal breath sounds.  Musculoskeletal:     Comments: 3+ BLE edema, mild purplish discoloration, Cap refill 3-4 seconds   Skin:    General: Skin is warm.     Capillary Refill: Capillary refill takes 2 to 3 seconds.  Neurological:     Mental Status: She is alert and oriented to person, place, and time.  Psychiatric:        Mood and Affect: Mood normal.        Behavior: Behavior normal.        Thought Content: Thought content normal.        Judgment: Judgment normal.         No results  found for any visits on 04/17/23.      Assessment & Plan:   Problem List Items Addressed This Visit   None Visit Diagnoses     Bilateral lower extremity edema    -  Primary Continue lasix 20 mg daily for now - repeat labs today. Will make adjustments based on labs.  Reinforced significance of compression socks, elevation, low sodium diet She has had some new left calf pain the past few days, no erythema. Will get [place], but she is on Eliquis.  Given that feet/toes are starting to appear slightly more discolored, cap refill 3-4 seconds, toes "shiny"/decreased hair, would like to get vascular input to ensure we are not missing anything given her history. ABIs normal last year; consider repeating. No severe symptoms at present - patient/daughter aware of signs/symptoms requiring further/urgent evaluation.  Will forward note to PCP for additional input as well.     Relevant Orders   Basic metabolic panel   US Venous Img Lower Bilateral (DVT)   Ambulatory referral to Vascular Surgery       No orders of the defined types were  placed in this encounter.    Return for pending results .  Burns Dana, NP

## 2023-04-18 ENCOUNTER — Ambulatory Visit: Payer: Self-pay

## 2023-04-18 ENCOUNTER — Telehealth: Payer: Self-pay | Admitting: Pharmacist

## 2023-04-18 LAB — BASIC METABOLIC PANEL
BUN: 26 mg/dL — ABNORMAL HIGH (ref 6–23)
CO2: 31 mEq/L (ref 19–32)
Calcium: 9.5 mg/dL (ref 8.4–10.5)
Chloride: 103 mEq/L (ref 96–112)
Creatinine, Ser: 1.26 mg/dL — ABNORMAL HIGH (ref 0.40–1.20)
GFR: 36.4 mL/min — ABNORMAL LOW (ref >60.00)
Glucose, Bld: 116 mg/dL — ABNORMAL HIGH (ref 70–99)
Potassium: 4.4 mEq/L (ref 3.5–5.1)
Sodium: 143 mEq/L (ref 135–145)

## 2023-04-18 NOTE — Patient Instructions (Signed)
Visit Information  Thank you for taking time to visit with me today. Please don't hesitate to contact me if I can be of assistance to you.   Following are the goals we discussed today:   Goals Addressed             This Visit's Progress    continue to improve post hospitalization       Interventions Today    Flowsheet Row Most Recent Value  Chronic Disease   Chronic disease during today's visit Congestive Heart Failure (CHF)  General Interventions   General Interventions Discussed/Reviewed General Interventions Discussed, Doctor Visits  Doctor Visits Discussed/Reviewed Doctor Visits Discussed, PCP  [reviewed upcoming appointments]  PCP/Specialist Visits Compliance with follow-up visit  Education Interventions   Education Provided Provided Education  [advised continue to weigh and record daily, take medications as prescribed. confirmed patient is active with palliative care]  Provided Verbal Education On Nutrition, Other  [advised to continue to eat healthy, avoid saturated/trans fats]  Nutrition Interventions   Nutrition Discussed/Reviewed Nutrition Discussed  Pharmacy Interventions   Pharmacy Dicussed/Reviewed Pharmacy Topics Discussed, Referral to Pharmacist  Referral to Pharmacist Cannot afford medications  [daughter would like to know if patient is eligible for assistance for Elliquis. she states cost is $57/month 30 day supply.]            Our next appointment is by telephone on 05/09/23 at 2:30 pm  Please call the care guide team at [phone] if you need to cancel or reschedule your appointment.   If you are experiencing a Mental Health or Behavioral Health Crisis or need someone to talk to, please call the Suicide and Crisis Lifeline: 71  Kathyrn Sheriff, RN, MSN, BSN, [employer] [phone]

## 2023-04-18 NOTE — Telephone Encounter (Signed)
Reviwed patient's 2024 [facility] plan [phone]) estimated medication cost.  Most expensive meds listed are:  Eliquis - $47 / month until she reaches coverage gap around September Dulera inhaler - $100 / month until she reaches coverage gap

## 2023-04-18 NOTE — Patient Outreach (Signed)
  Care Coordination   Initial Visit Note   04/18/2023 Name: Dana Burns MRN: [id number] DOB: 11-06-1926  Dana Burns is a 89 y.o. year old female who sees Bradd Canary, MD for primary care. I spoke with  Dana Burns by phone today.  What matters to the patients health and wellness today?  RNCM spoke with Ms. Counsell. She reports her legs feel better today with her legs than yesterday. RNCM also spoke with daughter Dana Burns. She reports she is trying to keep patient's legs elevated and will try to see if patient can tolerate compression stocking again.  Goals Addressed             This Visit's Progress    continue to improve post hospitalization       Interventions Today    Flowsheet Row Most Recent Value  Chronic Disease   Chronic disease during today's visit Congestive Heart Failure (CHF)  General Interventions   General Interventions Discussed/Reviewed General Interventions Discussed, Doctor Visits  Doctor Visits Discussed/Reviewed Doctor Visits Discussed, PCP  [reviewed upcoming appointments]  PCP/Specialist Visits Compliance with follow-up visit  Education Interventions   Education Provided Provided Education  [advised continue to weigh and record daily, take medications as prescribed. confirmed patient is active with palliative care]  Provided Verbal Education On Nutrition, Other  [advised to continue to eat healthy, avoid saturated/trans fats]  Nutrition Interventions   Nutrition Discussed/Reviewed Nutrition Discussed  Pharmacy Interventions   Pharmacy Dicussed/Reviewed Pharmacy Topics Discussed, Referral to Pharmacist  Referral to Pharmacist Cannot afford medications  [daughter would like to know if patient is eligible for assistance for Elliquis. she states cost is $57/month 30 day supply.]            SDOH assessments and interventions completed:  Yes SDOH Interventions Today    Flowsheet Row Most Recent Value  SDOH Interventions   Food Insecurity  Interventions Intervention Not Indicated  Housing Interventions Intervention Not Indicated     Care Coordination Interventions:  Yes, provided   Follow up plan: Follow up call scheduled for 05/09/23    Encounter Outcome:  Pt. Visit Completed   Kathyrn Sheriff, RN, MSN, BSN, CCM [facility] Care Coordinator [phone]

## 2023-04-18 NOTE — Telephone Encounter (Signed)
-----   Message from Colletta Maryland, RN sent at 04/18/2023  4:24 PM EDT ----- Regarding: medication assistance Hi Yaeli Hartung,  I spoke with Ms. Kellett's Daughter, Margaretmary Eddy. She would like to know if there is any assistance available re: Elliquis. Ms. Angelita Ingles reports cost to patient is $57/month for a 30 day supply. Thank you.  Kathyrn Sheriff, RN, MSN, BSN, CCM [facility] Care Coordinator [phone]

## 2023-04-19 ENCOUNTER — Encounter: Payer: Self-pay | Admitting: Pharmacist

## 2023-04-19 ENCOUNTER — Other Ambulatory Visit: Payer: Medicare Other | Admitting: Pharmacist

## 2023-04-19 DIAGNOSIS — I482 Chronic atrial fibrillation, unspecified: Secondary | ICD-10-CM

## 2023-04-19 DIAGNOSIS — Z79899 Other long term (current) drug therapy: Secondary | ICD-10-CM

## 2023-04-19 NOTE — Progress Notes (Signed)
04/19/2023 Name: Dana Burns MRN: [id number] DOB: 19-Jul-1926  Chief Complaint  Patient presents with   Medication Management    Initial visit    Dana Burns is a 89 y.o. year old female who presented for a telephone visit. Spoke with patient and her daughter Lafonda Mosses.    They were referred to the pharmacist by their Case Management Team  for assistance in managing medication access.    Subjective:  Medication Access/Adherence  Current Pharmacy:  [id number] - [place], [place] - [address] [place] [address] Phone: [phone] Fax: [phone]   Patient reports affordability concerns with their medications: Yes  Patient reports access/transportation concerns to their pharmacy: No  Patient reports adherence concerns with their medications:  Yes  due to cost  Reviwed patient's 2024 [facility] plan [phone]) estimated medication cost.  Most expensive meds listed are:  Eliquis - $47 / month until she reaches coverage gap around Dana Burns inhaler - $100 / month until she reaches coverage gap    Objective:  Lab Results  Component Value Date   HGBA1C 6.6 (H) 03/25/2023    Lab Results  Component Value Date   CREATININE 1.26 (H) 04/17/2023   BUN 26 (H) 04/17/2023   NA 143 04/17/2023   K 4.4 04/17/2023   CL 103 04/17/2023   CO2 31 04/17/2023    Lab Results  Component Value Date   CHOL 212 (H) 03/25/2023   HDL 44.00 03/25/2023   LDLCALC 143 (H) 03/25/2023   LDLDIRECT 168.0 11/20/2022   TRIG 123.0 03/25/2023   CHOLHDL 5 03/25/2023    Medications Reviewed Today     Reviewed by Henrene Pastor, RPH-CPP (Pharmacist) on 04/19/23 at 1214  Med List Status: <None>   Medication Order Taking? Sig Documenting Provider Last Dose Status Informant  acetaminophen (TYLENOL) 500 MG tablet [id number] No Take 2 tablets (1,000 mg total) by mouth every 8 (eight) hours. Barnetta Chapel, MD Taking Active   apixaban (ELIQUIS) 2.5  MG TABS tablet [id number] No Take 1 tablet (2.5 mg total) by mouth 2 (two) times daily. Kathlen Mody, MD Taking Active Child, Pharmacy Records  Cholecalciferol (VITAMIN D3) 50 MCG (2000 UT) capsule [id number] No Take 2,000 Units by mouth daily.  [provider] Taking Active Child, Pharmacy Records  diazepam (VALIUM) 5 MG tablet [id number] No TAKE 1/2 TO 1 TABLET DAILY AS NEEDED FOR ANXIETY  Patient taking differently: Take 2.5-5 mg by mouth daily.   Bradd Canary, MD Taking Active Child, Pharmacy Records  famotidine (PEPCID) 40 MG tablet [id number] No TAKE 1 TABLET BY MOUTH EVERYDAY AT BEDTIME Bradd Canary, MD Taking Active Child, Pharmacy Records  furosemide (LASIX) 20 MG tablet [id number] No Take 1 tablet (20 mg total) by mouth daily. Take for 3 days, then switch to as needed use for swelling associated with weight gain of 3 pounds in 24 hours or 6 pounds in a week. Let us know if you are having to use frequently so we can monitor kidney function.  Patient taking differently: Take 40 mg by mouth daily. Take for 3 days, then switch to as needed use for swelling associated with weight gain of 3 pounds in 24 hours or 6 pounds in a week. Let us know if you are having to use frequently so we can monitor kidney function.   Barnetta Chapel, MD Taking Active            Med Note [place], [place]  Thu Apr 18, 2023  3:42 PM) Reports takes 20 mg once a day.   gabapentin (NEURONTIN) 800 MG tablet [id number] No Take 1 tablet (800 mg total) by mouth 3 (three) times daily. Bradd Canary, MD Taking Active Child, Pharmacy Records           Med Note Epimenio Sarin, Madelon Lips Mar 28, 2023  9:45 AM) Daughter is adamant the Pt is still taking this medication TID. Dispense report does not support this claim.   metoprolol tartrate (LOPRESSOR) 25 MG tablet [id number] No Take 1 tablet twice daily. Please hold medication on the days systolic blood pressure (top number) is less than 90.  Patient taking  differently: Take 25 mg by mouth 2 (two) times daily.   Joylene Grapes, NP Taking Active Child, Pharmacy Records  mometasone-formoterol [facility]) 200-5 MCG/ACT Sandrea Matte [id number] No Inhale 2 puffs into the lungs 2 (two) times daily.  Patient taking differently: Inhale 2 puffs into the lungs as needed for wheezing or shortness of breath.   Kathlen Mody, MD Taking Active Child, Pharmacy Records  rOPINIRole (REQUIP) 1 MG tablet [id number] No TAKE 1 TABLET BY MOUTH AT BEDTIME. Bradd Canary, MD Taking Active Child, Pharmacy Records  tiZANidine (ZANAFLEX) 4 MG tablet [id number] No Take 1 tablet (4 mg total) by mouth 2 (two) times daily as needed for muscle spasms.  Patient taking differently: Take 4 mg by mouth at bedtime.   Bradd Canary, MD Taking Active Child, Pharmacy Records  venlafaxine XR (EFFEXOR-XR) 37.5 MG 24 hr capsule [id number] No TAKE 1 CAPSULE BY MOUTH DAILY WITH BREAKFAST.  Patient taking differently: Take 37.5 mg by mouth daily.   Bradd Canary, MD Taking Active Child, Pharmacy Records              Assessment/Plan:   Medication Management / Access:  - assisted patient in applying for LIS. If approved should drop cost of Eliquis to around $11 per month. (LIS re-entry code - [id number]) - if she is denied LIS then will assist in applying for Eliquis medication assistance program whic will request her to meet requirement of spending 3% of income out of pocket on medications for the 2024 calendar year (estimate would be about $650).  - Discussed changing to a lower cost / lower tier inhaler but patient / daughter states she is only using as needed and prefers not to change currently.  Lower cost alternatives would be Wixela or fluticasone / salmeterol inhaler = tier 2 or Breo which is tier 3.    Follow Up Plan: 4 weeks.   Henrene Pastor, PharmD Clinical Pharmacist [facility]

## 2023-04-19 NOTE — Patient Instructions (Signed)
Dana Burns It was a pleasure speaking with you and your daughter today.   We applied for extra assistance thru Medicare.  If approved should drop cost of Eliquis to around $11 per month.  If you are not approved for extra assistnace, then I can assist in applying for Eliquis medication assistance program. Please contact me at Dr Mariel Aloe office or the number below.     I also discussed changing to a lower cost / lower tier inhaler but since you have plenty on hand currently and only use it as needed we can wait to make change. Below are potential lower cost options.   Wixela or fluticasone / salmeterol inhaler = tier 2 or  Breo inhaler = tier 3  As always if you have any questions or concerns especially regarding medications, please feel free to contact me either at the phone number below or with a MyChart message.   Keep up the good work!  Henrene Pastor, PharmD Clinical Pharmacist [facility] [phone] (direct line)  [phone] (main office number)

## 2023-04-30 ENCOUNTER — Ambulatory Visit: Payer: Medicare Other | Admitting: Nurse Practitioner

## 2023-04-30 NOTE — Progress Notes (Deleted)
Office Visit    Patient Name: Dana Burns Date of Encounter: 04/30/2023  Primary Care Provider:  Bradd Canary, MD Primary Cardiologist:  Garwin Brothers, MD  Chief Complaint    89 year old female with a history of persistent atrial fibrillation, chronic diastolic heart failure, hypertension, hyperlipidemia, chronic renal insufficiency, and type 2 diabetes who presents for hospital follow-up related to atrial fibrillation.   Past Medical History    Past Medical History:  Diagnosis Date   Abdominal pain 2017-10-25   Allergy    sneeze, rhinorrhea in am   Anxiety state 09/03/2013   Breast mass, right 10/14/2012   Chicken pox as a child   Chronic pain syndrome 01/23/2015   Chronic renal insufficiency 03/17/2011   Colon cancer (HCC)    Complication of anesthesia    agitated when waking up, wakes up shaking, "like  I'm going into shock"   Constipation October 25, 2017   Depression    husband died  3 months ago, pt. admits depression  currently    Diabetic neuropathy (HCC) 03/17/2011   DM (diabetes mellitus) type 2, uncontrolled, with ketoacidosis (HCC) 03/17/2011   Ductal hyperplasia of breast 03/17/2011   Essential hypertension, benign 05/31/2010   Qualifier: Diagnosis of  By: Antoine Poche, MD, Alinda Dooms 07/15/2021   Fibromyalgia    GERD (gastroesophageal reflux disease)    H/O echocardiogram    done /w [place] in 2011, saw Dr. Antoine Poche for tachycardia    Headache    History of colon cancer 03/17/2011   Hyperlipidemia    6 years   Hyperlipidemia, mixed    6 years   Hypertension    15 years, reports she has a rapid heartrate    Loss of weight 12/14/2013   Measles as a child   Osteoarthritis (arthritis due to wear and tear of joints) 03/17/2011   Noted diffusely including neck   Osteopenia 12/14/2013   Pain in finger of right hand 05/15/2017   Pain in joint, lower leg 09/03/2013   Pain of right shoulder region 07/07/2014   Peripheral neuropathy    SOB (shortness of  breath) 05/31/2010   Qualifier: Diagnosis of  By: Antoine Poche, MD, Gerrit Heck     Stress 06/01/2013   Tachycardia 05/31/2010   Qualifier: Diagnosis of  By: Antoine Poche, MD, Gerrit Heck     Type 2 diabetes mellitus with peripheral neuropathy (HCC) 03/17/2011   Vitamin B 12 deficiency 07/07/2014   intrinsic factor positive   Past Surgical History:  Procedure Laterality Date   BREAST EXCISIONAL BIOPSY Right 2013   benign   BREAST SURGERY  2013   right- benign   CATARACT EXTRACTION     /w IOL- bilateral    COLON SURGERY     For resection of cancer   Nodule removed     From throat    Allergies  Allergies  Allergen Reactions   Tape Itching and Rash   Actos [Pioglitazone Hydrochloride] Other (See Comments)    Unknown reaction   Buspar [Buspirone Hcl] Other (See Comments)    Unknown reaction   Doxycycline Other (See Comments)    Made the patient "feel funny"- in a negative way   Lipitor [Atorvastatin Calcium] Other (See Comments)    "It made me hurt all over."   Penicillins Rash     Labs/Other Studies Reviewed    The following studies were reviewed today: Echo 12/2022: IMPRESSIONS    1. Left ventricular ejection fraction, by estimation, is 60 to  65%. The  left ventricle has normal function. The left ventricle has no regional  wall motion abnormalities. Left ventricular diastolic parameters are  indeterminate.   2. Right ventricular systolic function is normal. The right ventricular  size is normal. There is normal pulmonary artery systolic pressure.   3. Left atrial size was mildly dilated.   4. Right atrial size was mildly dilated.   5. The mitral valve is normal in structure. No evidence of mitral valve  regurgitation. No evidence of mitral stenosis.   6. The aortic valve is tricuspid. There is mild calcification of the  aortic valve. There is mild thickening of the aortic valve. Aortic valve  regurgitation is trivial. Aortic valve sclerosis is present, with no  evidence of  aortic valve stenosis.   7. The inferior vena cava is normal in size with greater than 50%  respiratory variability, suggesting right atrial pressure of 3 mmHg.       Recent Labs: 03/25/2023: TSH 3.05 03/27/2023: ALT 15 03/28/2023: Hemoglobin 12.5; Magnesium 2.1; Platelets 251 04/04/2023: B Natriuretic Peptide 559.3 04/10/2023: Pro B Natriuretic peptide (BNP) 558.0 04/17/2023: BUN 26; Creatinine, Ser 1.26; Potassium 4.4; Sodium 143  Recent Lipid Panel    Component Value Date/Time   CHOL 212 (H) 03/25/2023 1528   CHOL 180 06/21/2014 1449   CHOL 148 05/29/2013 1131   TRIG 123.0 03/25/2023 1528   TRIG 117 03/16/2014 1708   TRIG 133 05/29/2013 1131   HDL 44.00 03/25/2023 1528   HDL 56 06/21/2014 1449   HDL 55 03/16/2014 1708   HDL 48 05/29/2013 1131   CHOLHDL 5 03/25/2023 1528   VLDL 24.6 03/25/2023 1528   LDLCALC 143 (H) 03/25/2023 1528   LDLCALC 118 (H) 08/22/2020 1321   LDLCALC 77 03/16/2014 1708   LDLCALC 73 05/29/2013 1131   LDLDIRECT 168.0 11/20/2022 1615    History of Present Illness    89 year old female with the above past medial history including persistent atrial fibrillation, chronic diastolic heart failure, hypertension, hyperlipidemia, chronic renal insufficiency, and type 2 diabetes.   She was evaluated by Dr. Tomie China in 2022 in the setting of palpitations. Cardiac monitor in 07/2021 revealed PSVT.  Echo at the time showed severe concentric LVH, G3 DD, normal RV systolic function, no significant valvular abnormalities.  She was hospitalized in January 2024 in the setting of acute respiratory failure secondary to RSV, new atrial fibrillation with RVR.  Cardiology was consulted.  TSH was normal.  Repeat echocardiogram was normal.  She was started on Eliquis and metoprolol.  Given advanced age, renal dysfunction, borderline hypotension, rate control strategy was recommended.  She returned to the ED on 03/13/2023 with generalized weakness-EKG showed atrial fibrillation.  She had  a brief period of tachycardia in the setting of having missed her evening dose of beta-blocker.  She was discharged home in stable condition. She was last seen in the office on 03/21/2023 and noted ongoing dyspnea on exertion, fatigue, poor appetite, generalized weakness, and low BP.  She declined DCCV or any other invasive procedures.  Metoprolol was increased to 25 mg twice daily.  It was noted that she remained in atrial fibrillation, amiodarone 200 mg daily could be considered. Outpatient follow-up with palliative care was recommended.  She presented to the ED on 03/28/2023 with lower extremity edema.  She was given IV Lasix and discharged home.  She returned to the ED on 04/04/2023 with ongoing lower extremity edema.  Lasix was increased to 40 mg daily.  He returned to  the ED on 04/06/2023 with concern for possible cellulitis.  Exam was unremarkable and she was discharged home and advised to follow-up with her PCP.   She presents today for follow-up accompanied by her daughter. Since her last visit   1. Persistent atrial fibrillation: Newly diagnosed in January 2024. On Eliquis and metoprolol.  Rate control strategy was recommended.  However, she appears to be rather symptomatic with her A-fib. She notes generalized fatigue, weakness, and dyspnea on exertion. HR is somewhat elevated, BP is low. When discussing treatment options, patient states she does not wish to pursue DCCV or any other invasive procedures at this time.  Will trial increase of metoprolol to 25 mg twice daily and see if her BP tolerates this.  Will also discuss with Dr. Flora Lipps (who saw her in the hospital), possible initiation of amiodarone (BP may not tolerate full loading dose, but she may tolerate 200 mg daily).  She is following with palliative care, recommend close follow-up, ongoing goals of care discussion.   ADDENDUM 03/29/2023: Per Dr. Flora Lipps, if pt remains in atrial fibrillation, could consider addition of amiodarone 200 mg daily. Can  discuss at next follow-up visit.    2. Chronic diastolic heart failure: Recent echo in 12/2022 was essentially normal.  She notes significant dyspnea on exertion, generalized fatigue, otherwise, euvolemic and well compensated on exam.  I do not think her BP will tolerate Lasix at this point in time.  Will try to achieve better heart rate control to see if this reduces heart failure symptoms.  Consider amiodarone as above I suspect A-fib is driving some of her fluid retention, symptoms.   3. Hypertension: BP is soft with SBP in the 90s.  Continue to monitor BP.  Currenlty only taking metoprolol.   4. CKD stage IIIb: Creatinine was stable at 1.19 in 03/2023.   5. Type 2 diabetes: A1C was 6.8 in 10/2022.  Monitored and managed per PCP.   6. Disposition: Follow-up i  Home Medications    Current Outpatient Medications  Medication Sig Dispense Refill   acetaminophen (TYLENOL) 500 MG tablet Take 2 tablets (1,000 mg total) by mouth every 8 (eight) hours. 30 tablet 0   apixaban (ELIQUIS) 2.5 MG TABS tablet Take 1 tablet (2.5 mg total) by mouth 2 (two) times daily. 60 tablet 3   Cholecalciferol (VITAMIN D3) 50 MCG (2000 UT) capsule Take 2,000 Units by mouth daily.      diazepam (VALIUM) 5 MG tablet TAKE 1/2 TO 1 TABLET DAILY AS NEEDED FOR ANXIETY (Patient taking differently: Take 2.5-5 mg by mouth daily.) 30 tablet 1   famotidine (PEPCID) 40 MG tablet TAKE 1 TABLET BY MOUTH EVERYDAY AT BEDTIME 90 tablet 1   furosemide (LASIX) 20 MG tablet Take 1 tablet (20 mg total) by mouth daily. Take for 3 days, then switch to as needed use for swelling associated with weight gain of 3 pounds in 24 hours or 6 pounds in a week. Let us know if you are having to use frequently so we can monitor kidney function. (Patient taking differently: Take 40 mg by mouth daily. Take for 3 days, then switch to as needed use for swelling associated with weight gain of 3 pounds in 24 hours or 6 pounds in a week. Let us know if you are  having to use frequently so we can monitor kidney function.) 30 tablet 0   gabapentin (NEURONTIN) 800 MG tablet Take 1 tablet (800 mg total) by mouth 3 (three) times daily. 270  tablet 1   metoprolol tartrate (LOPRESSOR) 25 MG tablet Take 1 tablet twice daily. Please hold medication on the days systolic blood pressure (top number) is less than 90. (Patient taking differently: Take 25 mg by mouth 2 (two) times daily.) 180 tablet 3   mometasone-formoterol (DULERA) 200-5 MCG/ACT AERO Inhale 2 puffs into the lungs 2 (two) times daily. (Patient taking differently: Inhale 2 puffs into the lungs as needed for wheezing or shortness of breath.) 1 each 2   rOPINIRole (REQUIP) 1 MG tablet TAKE 1 TABLET BY MOUTH AT BEDTIME. 90 tablet 1   tiZANidine (ZANAFLEX) 4 MG tablet Take 1 tablet (4 mg total) by mouth 2 (two) times daily as needed for muscle spasms. (Patient taking differently: Take 4 mg by mouth at bedtime.) 180 tablet 1   venlafaxine XR (EFFEXOR-XR) 37.5 MG 24 hr capsule TAKE 1 CAPSULE BY MOUTH DAILY WITH BREAKFAST. (Patient taking differently: Take 37.5 mg by mouth daily.) 90 capsule 1   No current facility-administered medications for this visit.     Review of Systems    ***.  All other systems reviewed and are otherwise negative except as noted above.    Physical Exam    VS:  There were no vitals taken for this visit. , BMI There is no height or weight on file to calculate BMI.     GEN: Well nourished, well developed, in no acute distress. HEENT: normal. Neck: Supple, no JVD, carotid bruits, or masses. Cardiac: RRR, no murmurs, rubs, or gallops. No clubbing, cyanosis, edema.  Radials/DP/PT 2+ and equal bilaterally.  Respiratory:  Respirations regular and unlabored, clear to auscultation bilaterally. GI: Soft, nontender, nondistended, BS + x 4. MS: no deformity or atrophy. Skin: warm and dry, no rash. Neuro:  Strength and sensation are intact. Psych: Normal affect.  Accessory Clinical  Findings    ECG personally reviewed by me today - *** - no acute changes.   Lab Results  Component Value Date   WBC 6.0 03/28/2023   HGB 12.5 03/28/2023   HCT 39.5 03/28/2023   MCV 87.4 03/28/2023   PLT 251 03/28/2023   Lab Results  Component Value Date   CREATININE 1.26 (H) 04/17/2023   BUN 26 (H) 04/17/2023   NA 143 04/17/2023   K 4.4 04/17/2023   CL 103 04/17/2023   CO2 31 04/17/2023   Lab Results  Component Value Date   ALT 15 03/27/2023   AST 19 03/27/2023   ALKPHOS 51 03/27/2023   BILITOT 0.6 03/27/2023   Lab Results  Component Value Date   CHOL 212 (H) 03/25/2023   HDL 44.00 03/25/2023   LDLCALC 143 (H) 03/25/2023   LDLDIRECT 168.0 11/20/2022   TRIG 123.0 03/25/2023   CHOLHDL 5 03/25/2023    Lab Results  Component Value Date   HGBA1C 6.6 (H) 03/25/2023    Assessment & Plan    1.  ***  No BP recorded.  {Refresh Note OR Click here to enter BP  :1}***   Joylene Grapes, NP 04/30/2023, 6:57 AM

## 2023-05-03 ENCOUNTER — Encounter: Payer: Self-pay | Admitting: Family Medicine

## 2023-05-03 DIAGNOSIS — R413 Other amnesia: Secondary | ICD-10-CM

## 2023-05-08 ENCOUNTER — Other Ambulatory Visit: Payer: Medicare Other

## 2023-05-08 VITALS — BP 110/70 | HR 68 | Temp 97.8°F

## 2023-05-08 DIAGNOSIS — Z515 Encounter for palliative care: Secondary | ICD-10-CM

## 2023-05-08 NOTE — Progress Notes (Unsigned)
PATIENT NAME: MCCOY CHAO DOB: 09-11-1926 MRN: [id number]  PRIMARY CARE PROVIDER: Bradd Canary, MD  RESPONSIBLE PARTY:  Acct ID - Guarantor Home Phone Work Phone Relationship Acct Type  [id number] DOTT, BLUFORD* [phone]  Self P/F     [address] [place], [place] [address]   Palliative Care Follow Up Encounter Note    Completed home visit.  Daughter [name] also present.       Respiratory: denies SOB for today; decreased breath sounds; pt stood up and became SOB   Cardiac: BLE edema; irregular heartbeat   Cognitive: alert and oriented; very animated and chatty   Appetite: increased intake is now 2-3 cups of fluid daily; 1 reduced meals daily; pt reports not feeling hungry these days    GI/GU: urine is sometimes dark; has a BM every few days   Mobility:  now walks with a walker; sometimes she doesn't use her walker at all   ADLs: dependent   Sleeping Pattern: pt reports she is sleeping well; dozes in the day time   Pain: 6/10 pain this visit   Falls: denies falls   Palliative Care/ Hospice: Discussed benefits of hospice care as well as the differences between the two with patient.    Goals of Care: To stay in the home and not go to the hospital    CODE STATUS: Full Code ADVANCED DIRECTIVES: N MOST FORM: N PPS: 50%    PHYSICAL EXAM:   VITALS: Today's Vitals   05/08/23 1301  BP: 110/70  Pulse: 68  Temp: 97.8 F (36.6 C)  TempSrc: Temporal  SpO2: 97%  PainSc: 6   PainLoc: Leg    LUNGS: {SYSTEM LUNGS ADULT/PED EXAM:21906} CARDIAC: {Mis exam cardio:32073}} *** EXTREMITIES: {Exam; extremity:10330} SKIN: {Findings; skin exam-one line:31329::"Skin color, texture, turgor normal. No rashes or lesions"}  NEURO: {Findings; ROS neuro:30532::"negative"}       Silvie Obremski Clementeen Graham, RN

## 2023-05-09 ENCOUNTER — Telehealth: Payer: Self-pay

## 2023-05-09 NOTE — Patient Outreach (Signed)
  Care Coordination   05/09/2023 Name: SAKHIA STIDD MRN: [id number] DOB: 15-Sep-1926   Care Coordination Outreach Attempts:  An unsuccessful telephone outreach was attempted for a scheduled appointment today.  Follow Up Plan:  Additional outreach attempts will be made to offer the patient care coordination information and services.   Encounter Outcome:  No Answer   Care Coordination Interventions:  No, not indicated    Kathyrn Sheriff, RN, MSN, BSN, CCM [facility] Care Coordinator [phone]

## 2023-05-13 ENCOUNTER — Encounter: Payer: Self-pay | Admitting: Family Medicine

## 2023-05-13 ENCOUNTER — Ambulatory Visit (INDEPENDENT_AMBULATORY_CARE_PROVIDER_SITE_OTHER): Payer: Medicare Other | Admitting: Family Medicine

## 2023-05-13 VITALS — BP 120/81 | HR 100 | Ht 66.0 in | Wt 125.0 lb

## 2023-05-13 DIAGNOSIS — R600 Localized edema: Secondary | ICD-10-CM

## 2023-05-13 DIAGNOSIS — N189 Chronic kidney disease, unspecified: Secondary | ICD-10-CM

## 2023-05-13 NOTE — Progress Notes (Signed)
Acute Office Visit  Subjective:     Patient ID: Dana Burns, female    DOB: [birth year], 89 y.o.   MRN: [id number]  Chief Complaint  Patient presents with   Medical Management of Chronic Issues    HPI Patient is in today for follow-up on lower extremity edema and renal function check. She is here with her daughter.   She has been taking lasix 20 mg daily for lower extremity edema. Renal function was stable at last check on 04/18/23. She was instructed to continue current lasix and lifestyle measures were reinforced. PCP was aware and agreed with treatment plan.   Today she reports she has been doing well. States she was able to elevated legs more last week and that seemed to help swelling significantly. She is taking lasix as prescribed with no side effects. She has not been wearing compression socks - states her legs were too swollen to get them on and she hasn't tried again since then. Patient denies any chest pain, palpitations, dyspnea, wheezing, recurrent headaches, vision changes.        Wt Readings from Last 3 Encounters:  05/13/23 125 lb (56.7 kg)  04/17/23 132 lb (59.9 kg)  04/10/23 130 lb (59 kg)       ROS All review of systems negative except what is listed in the HPI      Objective:    BP 120/81   Pulse 100   Ht 5\' 6"  (1.676 m)   Wt 125 lb (56.7 kg)   SpO2 96%   BMI 20.18 kg/m    Physical Exam Vitals reviewed.  Constitutional:      Appearance: Normal appearance.  Cardiovascular:     Rate and Rhythm: Normal rate. Rhythm irregular.  Pulmonary:     Effort: Pulmonary effort is normal.     Breath sounds: Normal breath sounds.  Musculoskeletal:     Comments: 1+ BLE edema  Skin:    General: Skin is warm.     Capillary Refill: Capillary refill takes 2 to 3 seconds.  Neurological:     Mental Status: She is alert and oriented to person, place, and time.  Psychiatric:        Mood and Affect: Mood normal.        Behavior: Behavior normal.         Thought Content: Thought content normal.        Judgment: Judgment normal.     No results found for any visits on 05/13/23.      Assessment & Plan:   Problem List Items Addressed This Visit     Chronic renal insufficiency   Relevant Orders   Basic Metabolic Panel (BMET)   Other Visit Diagnoses     Bilateral lower extremity edema    -  Primary   Relevant Orders   Basic Metabolic Panel (BMET)   B Nat Peptide      Your legs are looking much better today! Please continue the lasix 20 mg daily for now - we will check labs today and let you know if we need to make any changes.  Continue to elevate legs as often as possible when seated Wear compression socks during the day, off at night Continue working on low sodium diet  Patient aware of signs/symptoms requiring further/urgent evaluation.        No orders of the defined types were placed in this encounter.   Return if symptoms worsen or fail to improve, for (keep PCP appointment  in a few weeks).  Clayborne Dana, NP

## 2023-05-13 NOTE — Patient Instructions (Addendum)
Your legs are looking much better today! Please continue the lasix 20 mg daily for now - we will check labs today and let you know if we need to make any changes.  Continue to elevate legs as often as possible when seated Wear compression socks during the day, off at night Continue working on low sodium diet  Please contact office for follow-up if symptoms do not improve or worsen. Seek emergency care if symptoms become severe.

## 2023-05-14 ENCOUNTER — Telehealth: Payer: Self-pay | Admitting: *Deleted

## 2023-05-14 LAB — BASIC METABOLIC PANEL
BUN: 30 mg/dL — ABNORMAL HIGH (ref 6–23)
CO2: 29 mEq/L (ref 19–32)
Calcium: 9.5 mg/dL (ref 8.4–10.5)
Chloride: 100 mEq/L (ref 96–112)
Creatinine, Ser: 1.56 mg/dL — ABNORMAL HIGH (ref 0.40–1.20)
GFR: 28.16 mL/min — ABNORMAL LOW (ref >60.00)
Glucose, Bld: 111 mg/dL — ABNORMAL HIGH (ref 70–99)
Potassium: 4.3 mEq/L (ref 3.5–5.1)
Sodium: 139 mEq/L (ref 135–145)

## 2023-05-14 LAB — BRAIN NATRIURETIC PEPTIDE: Pro B Natriuretic peptide (BNP): 414 pg/mL — ABNORMAL HIGH (ref 0.0–100.0)

## 2023-05-14 NOTE — Addendum Note (Signed)
Addended by: Hyman Hopes B on: 05/14/2023 12:11 PM   Modules accepted: Orders

## 2023-05-14 NOTE — Progress Notes (Signed)
  Care Coordination Note  05/14/2023 Name: Dana Burns MRN: [id number] DOB: 06-04-1926  Dana Burns is a 89 y.o. year old female who is a primary care patient of Bradd Canary, MD and is actively engaged with the care management team. I reached out to Dana Burns by phone today to assist with re-scheduling a follow up visit with the RN Case Manager  Follow up plan: Unsuccessful telephone outreach attempt made. A HIPAA compliant phone message was left for the patient providing contact information and requesting a return call.   Burman Nieves, CCMA Care Coordination Care Guide Direct Dial: [phone]

## 2023-05-15 ENCOUNTER — Telehealth: Payer: Medicare Other

## 2023-05-18 ENCOUNTER — Other Ambulatory Visit: Payer: Self-pay | Admitting: Family Medicine

## 2023-05-18 DIAGNOSIS — F419 Anxiety disorder, unspecified: Secondary | ICD-10-CM

## 2023-05-20 NOTE — Telephone Encounter (Signed)
Requesting: Valium  Contract: N/A UDS: N/A Last Visit: 05/13/2023 Next Visit: 07/01/2023  Last Refill: 11/20/2022  Please Advise

## 2023-05-31 ENCOUNTER — Telehealth: Payer: Self-pay

## 2023-05-31 ENCOUNTER — Other Ambulatory Visit: Payer: Self-pay | Admitting: *Deleted

## 2023-05-31 DIAGNOSIS — R600 Localized edema: Secondary | ICD-10-CM

## 2023-05-31 NOTE — Progress Notes (Signed)
  Care Coordination Note  05/31/2023 Name: Dana Burns MRN: [id number] DOB: Mar 02, 1926  Dana Burns is a 89 y.o. year old female who is a primary care patient of Bradd Canary, MD and is actively engaged with the care management team. I reached out to Dana Burns by phone today to assist with re-scheduling a follow up visit with the RN Case Manager  Follow up plan: Telephone appointment with care management team member scheduled for: 06/17/2023  Burman Nieves, [facility] Care Coordination Care Guide Direct Dial: [phone]

## 2023-05-31 NOTE — Progress Notes (Signed)
  Care Coordination Note  05/31/2023 Name: ISAI ATANACIO MRN: [id number] DOB: 1926-08-08  Dana Burns is a 89 y.o. year old female who is a primary care patient of Bradd Canary, MD and is actively engaged with the care management team. I reached out to Dana Burns by phone today to assist with re-scheduling a follow up visit with the RN Case Manager  Follow up plan: We have been unable to make contact with the patient for follow up.   Burman Nieves, CCMA Care Coordination Care Guide Direct Dial: [phone]

## 2023-05-31 NOTE — Telephone Encounter (Signed)
LVM to r/s MD visit, pt is scheduled on a vascular day

## 2023-06-03 ENCOUNTER — Other Ambulatory Visit: Payer: Medicare Other

## 2023-06-03 ENCOUNTER — Other Ambulatory Visit: Payer: Self-pay | Admitting: *Deleted

## 2023-06-03 DIAGNOSIS — Z515 Encounter for palliative care: Secondary | ICD-10-CM

## 2023-06-03 DIAGNOSIS — R600 Localized edema: Secondary | ICD-10-CM

## 2023-06-04 NOTE — Progress Notes (Unsigned)
PATIENT NAME: Dana Burns DOB: November 18, 1926 MRN: [id number]  PRIMARY CARE PROVIDER: Bradd Canary, MD  RESPONSIBLE PARTY:  Acct ID - Guarantor Home Phone Work Phone Relationship Acct Type  [id number] NILAH, PAPALIA* [phone]  Self P/F     [address] [place], [place] [address]   Palliative Care Follow Up Encounter Note   I connected with [name] Pegram (dtr) and  [name] on 06/03/23 by telephone and verified that I am speaking with the correct person using two identifiers.  HISTORY OF PRESENT ILLNESS:  HTN, HFpEF; Chronic A-Fib; GERD; DM II; CKD 3b; SOB; Osteoarthritis   Daughter [name] and pt report pain in the BLE; pt reports a pain level of "6/10 when it hurts"; dtr reports pt will be going to her PCP at the end of June.   Daughter also reports pt has a hard time swallowing water. She is able to drink coffee, juice, lemonade, and ginger ale but vomits the water up, if she is able to get it down.  No other changes noted since the last Palliative visit.   PHYSICAL EXAM:   VITALS:There were no vitals filed for this visit.     Next scheduled appt: 06/25/23 @ 1:30pm     Ieesha Abbasi Clementeen Graham, LPN

## 2023-06-10 ENCOUNTER — Ambulatory Visit (HOSPITAL_COMMUNITY): Payer: Medicare Other

## 2023-06-10 ENCOUNTER — Encounter: Payer: Medicare Other | Admitting: Surgery

## 2023-06-13 ENCOUNTER — Ambulatory Visit: Payer: Medicare Other | Attending: Nurse Practitioner | Admitting: Nurse Practitioner

## 2023-06-13 ENCOUNTER — Encounter: Payer: Self-pay | Admitting: Nurse Practitioner

## 2023-06-13 VITALS — BP 128/84 | HR 95 | Ht 66.0 in | Wt 127.0 lb

## 2023-06-13 DIAGNOSIS — I5032 Chronic diastolic (congestive) heart failure: Secondary | ICD-10-CM

## 2023-06-13 DIAGNOSIS — I10 Essential (primary) hypertension: Secondary | ICD-10-CM

## 2023-06-13 DIAGNOSIS — I4819 Other persistent atrial fibrillation: Secondary | ICD-10-CM | POA: Diagnosis not present

## 2023-06-13 DIAGNOSIS — E1142 Type 2 diabetes mellitus with diabetic polyneuropathy: Secondary | ICD-10-CM | POA: Diagnosis not present

## 2023-06-13 DIAGNOSIS — N1832 Chronic kidney disease, stage 3b: Secondary | ICD-10-CM

## 2023-06-13 MED ORDER — APIXABAN 2.5 MG PO TABS: 2.5000 mg | ORAL_TABLET | Freq: Two times a day (BID) | ORAL | 3 refills | Status: DC

## 2023-06-13 NOTE — Patient Instructions (Addendum)
Medication Instructions:  Your physician recommends that you continue on your current medications as directed. Please refer to the Current Medication list given to you today.  *If you need a refill on your cardiac medications before your next appointment, please call your pharmacy*   Lab Work: NONE ordered at this time of appointment    Testing/Procedures: NONE ordered at this time of appointment     Follow-Up: At [facility], you and your health needs are our priority.  As part of our continuing mission to provide you with exceptional heart care, we have created designated Provider Care Teams.  These Care Teams include your primary Cardiologist (physician) and Advanced Practice Providers (APPs -  Physician Assistants and Nurse Practitioners) who all work together to provide you with the care you need, when you need it.  We recommend signing up for the patient portal called "MyChart".  Sign up information is provided on this After Visit Summary.  MyChart is used to connect with patients for Virtual Visits (Telemedicine).  Patients are able to view lab/test results, encounter notes, upcoming appointments, etc.  Non-urgent messages can be sent to your provider as well.   To learn more about what you can do with MyChart, go to [email or web address].    Your next appointment:   3-4 month(s)  Provider:   Dr. Tomie China or Bernadene Person, NP        Other Instructions

## 2023-06-13 NOTE — Progress Notes (Signed)
Office Visit    Patient Name: Dana Burns Date of Encounter: 06/13/2023  Primary Care Provider:  Bradd Canary, MD Primary Cardiologist:  Garwin Brothers, MD  Chief Complaint    89 year old female with a history of persistent atrial fibrillation, chronic diastolic heart failure, hypertension, hyperlipidemia, chronic renal insufficiency, and type 2 diabetes who presents for follow-up related to heart failure and atrial fibrillation.   Past Medical History    Past Medical History:  Diagnosis Date   Abdominal pain 2017/10/18   Allergy    sneeze, rhinorrhea in am   Anxiety state 09/03/2013   Breast mass, right 10/14/2012   Chicken pox as a child   Chronic pain syndrome 01/23/2015   Chronic renal insufficiency 03/17/2011   Colon cancer (HCC)    Complication of anesthesia    agitated when waking up, wakes up shaking, "like  I'm going into shock"   Constipation Oct 18, 2017   Depression    husband died  3 months ago, pt. admits depression  currently    Diabetic neuropathy (HCC) 03/17/2011   DM (diabetes mellitus) type 2, uncontrolled, with ketoacidosis (HCC) 03/17/2011   Ductal hyperplasia of breast 03/17/2011   Essential hypertension, benign 05/31/2010   Qualifier: Diagnosis of  By: Antoine Poche, MD, Alinda Dooms 07/15/2021   Fibromyalgia    GERD (gastroesophageal reflux disease)    H/O echocardiogram    done /w [place] in 2011, saw Dr. Antoine Poche for tachycardia    Headache    History of colon cancer 03/17/2011   Hyperlipidemia    6 years   Hyperlipidemia, mixed    6 years   Hypertension    15 years, reports she has a rapid heartrate    Loss of weight 12/14/2013   Measles as a child   Osteoarthritis (arthritis due to wear and tear of joints) 03/17/2011   Noted diffusely including neck   Osteopenia 12/14/2013   Pain in finger of right hand 05/15/2017   Pain in joint, lower leg 09/03/2013   Pain of right shoulder region 07/07/2014   Peripheral neuropathy    SOB (shortness  of breath) 05/31/2010   Qualifier: Diagnosis of  By: Antoine Poche, MD, Gerrit Heck     Stress 06/01/2013   Tachycardia 05/31/2010   Qualifier: Diagnosis of  By: Antoine Poche, MD, Gerrit Heck     Type 2 diabetes mellitus with peripheral neuropathy (HCC) 03/17/2011   Vitamin B 12 deficiency 07/07/2014   intrinsic factor positive   Past Surgical History:  Procedure Laterality Date   BREAST EXCISIONAL BIOPSY Right 2013   benign   BREAST SURGERY  2013   right- benign   CATARACT EXTRACTION     /w IOL- bilateral    COLON SURGERY     For resection of cancer   Nodule removed     From throat    Allergies  Allergies  Allergen Reactions   Tape Itching and Rash   Actos [Pioglitazone Hydrochloride] Other (See Comments)    Unknown reaction   Buspar [Buspirone Hcl] Other (See Comments)    Unknown reaction   Doxycycline Other (See Comments)    Made the patient "feel funny"- in a negative way   Lipitor [Atorvastatin Calcium] Other (See Comments)    "It made me hurt all over."   Penicillins Rash     Labs/Other Studies Reviewed    The following studies were reviewed today:  Cardiac Studies & Procedures       ECHOCARDIOGRAM  ECHOCARDIOGRAM  COMPLETE 01/14/2023  Narrative ECHOCARDIOGRAM REPORT    Patient Name:   Dana Burns Date of Exam: 01/14/2023 Medical Rec #:  [id number]     Height:       66.0 in Accession #:    [id number]    Weight:       132.0 lb Date of Birth:  09-29-1926     BSA:          1.676 m Patient Age:    89 years      BP:           107/46 mmHg Patient Gender: F             HR:           113 bpm. Exam Location:  Inpatient  Procedure: 2D Echo  Indications:    atrial fibrillation  History:        Patient has prior history of Echocardiogram examinations, most recent 08/03/2021. Arrythmias:Atrial Fibrillation and Tachycardia; Risk Factors:Diabetes and Hypertension.  Sonographer:    Cathie Hoops Referring Phys: Herminio Heads   Sonographer Comments: Technically  difficult study due to poor echo windows and suboptimal subcostal window. IMPRESSIONS   1. Left ventricular ejection fraction, by estimation, is 60 to 65%. The left ventricle has normal function. The left ventricle has no regional wall motion abnormalities. Left ventricular diastolic parameters are indeterminate. 2. Right ventricular systolic function is normal. The right ventricular size is normal. There is normal pulmonary artery systolic pressure. 3. Left atrial size was mildly dilated. 4. Right atrial size was mildly dilated. 5. The mitral valve is normal in structure. No evidence of mitral valve regurgitation. No evidence of mitral stenosis. 6. The aortic valve is tricuspid. There is mild calcification of the aortic valve. There is mild thickening of the aortic valve. Aortic valve regurgitation is trivial. Aortic valve sclerosis is present, with no evidence of aortic valve stenosis. 7. The inferior vena cava is normal in size with greater than 50% respiratory variability, suggesting right atrial pressure of 3 mmHg.  FINDINGS Left Ventricle: Left ventricular ejection fraction, by estimation, is 60 to 65%. The left ventricle has normal function. The left ventricle has no regional wall motion abnormalities. The left ventricular internal cavity size was normal in size. There is no left ventricular hypertrophy. Left ventricular diastolic parameters are indeterminate.  Right Ventricle: The right ventricular size is normal. No increase in right ventricular wall thickness. Right ventricular systolic function is normal. There is normal pulmonary artery systolic pressure. The tricuspid regurgitant velocity is 2.54 m/s, and with an assumed right atrial pressure of 3 mmHg, the estimated right ventricular systolic pressure is 28.8 mmHg.  Left Atrium: Left atrial size was mildly dilated.  Right Atrium: Right atrial size was mildly dilated.  Pericardium: There is no evidence of pericardial  effusion.  Mitral Valve: The mitral valve is normal in structure. No evidence of mitral valve regurgitation. No evidence of mitral valve stenosis.  Tricuspid Valve: The tricuspid valve is normal in structure. Tricuspid valve regurgitation is mild . No evidence of tricuspid stenosis.  Aortic Valve: The aortic valve is tricuspid. There is mild calcification of the aortic valve. There is mild thickening of the aortic valve. Aortic valve regurgitation is trivial. Aortic regurgitation PHT measures 401 msec. Aortic valve sclerosis is present, with no evidence of aortic valve stenosis. Aortic valve mean gradient measures 4.7 mmHg. Aortic valve peak gradient measures 8.3 mmHg. Aortic valve area, by VTI measures 2.46 cm.  Pulmonic Valve: The  pulmonic valve was normal in structure. Pulmonic valve regurgitation is not visualized. No evidence of pulmonic stenosis.  Aorta: The aortic root is normal in size and structure.  Venous: The inferior vena cava is normal in size with greater than 50% respiratory variability, suggesting right atrial pressure of 3 mmHg.  IAS/Shunts: No atrial level shunt detected by color flow Doppler.   LEFT VENTRICLE PLAX 2D LVIDd:         3.70 cm     Diastology LVIDs:         2.50 cm     LV e' medial:    5.91 cm/s LV PW:         1.00 cm     LV E/e' medial:  14.1 LV IVS:        1.00 cm     LV e' lateral:   6.85 cm/s LVOT diam:     1.80 cm     LV E/e' lateral: 12.2 LV SV:         46 LV SV Index:   27 LVOT Area:     2.54 cm  LV Volumes (MOD) LV vol d, MOD A2C: 51.3 ml LV vol d, MOD A4C: 48.9 ml LV vol s, MOD A2C: 21.6 ml LV vol s, MOD A4C: 23.6 ml LV SV MOD A2C:     29.7 ml LV SV MOD A4C:     48.9 ml LV SV MOD BP:      27.6 ml  RIGHT VENTRICLE RV Basal diam:  3.40 cm RV Mid diam:    3.00 cm RV S prime:     13.97 cm/s TAPSE (M-mode): 1.2 cm  LEFT ATRIUM           Index        RIGHT ATRIUM           Index LA diam:      2.90 cm 1.73 cm/m   RA Area:     13.20  cm LA Vol (A4C): 41.5 ml 24.76 ml/m  RA Volume:   31.90 ml  19.03 ml/m AORTIC VALVE                    PULMONIC VALVE AV Area (Vmax):    2.24 cm     PV Vmax:       1.05 m/s AV Area (Vmean):   2.14 cm     PV Peak grad:  4.4 mmHg AV Area (VTI):     2.46 cm AV Vmax:           144.00 cm/s AV Vmean:          97.033 cm/s AV VTI:            0.185 m AV Peak Grad:      8.3 mmHg AV Mean Grad:      4.7 mmHg LVOT Vmax:         127.00 cm/s LVOT Vmean:        81.633 cm/s LVOT VTI:          0.179 m LVOT/AV VTI ratio: 0.97 AI PHT:            401 msec  AORTA Ao Root diam: 3.10 cm Ao Asc diam:  2.70 cm  MITRAL VALVE               TRICUSPID VALVE MV Area (PHT): 3.88 cm    TR Peak grad:   25.8 mmHg MV Decel Time: 196 msec  TR Vmax:        254.00 cm/s MR Peak grad: 28.5 mmHg MR Vmax:      267.00 cm/s  SHUNTS MV E velocity: 83.43 cm/s  Systemic VTI:  0.18 m Systemic Diam: 1.80 cm  Charlton Haws MD Electronically signed by Charlton Haws MD Signature Date/Time: 01/14/2023/1:42:21 PM    Final    MONITORS  LONG TERM MONITOR (3-14 DAYS) 08/02/2021  Narrative Patch Wear Time:  7 days and 0 hours (2022-07-19T10:43:10-399 to 2022-07-26T11:34:46-0400)  Patient had a min HR of 36 bpm, max HR of 171 bpm, and avg HR of 69 bpm.  Predominant underlying rhythm was Sinus Rhythm. Bundle Branch Block/IVCD was present.  103 Supraventricular Tachycardia runs occurred, the run with the fastest interval lasting 4 beats with a max rate of 171 bpm, the longest lasting 11 beats with an avg rate of 131 bpm. Isolated SVEs were frequent (6.6%, 45837), SVE Couplets were occasional (1.3%, 4394), and SVE Triplets were rare (<1.0%, 1620).  Isolated VEs were rare (<1.0%), and no VE Couplets or VE Triplets were present.  Impression: Overall unremarkable event monitor.  Rare brief atrial runs.          Recent Labs: 03/25/2023: TSH 3.05 03/27/2023: ALT 15 03/28/2023: Hemoglobin 12.5; Magnesium 2.1; Platelets  251 04/04/2023: B Natriuretic Peptide 559.3 05/13/2023: BUN 30; Creatinine, Ser 1.56; Potassium 4.3; Pro B Natriuretic peptide (BNP) 414.0; Sodium 139  Recent Lipid Panel    Component Value Date/Time   CHOL 212 (H) 03/25/2023 1528   CHOL 180 06/21/2014 1449   CHOL 148 05/29/2013 1131   TRIG 123.0 03/25/2023 1528   TRIG 117 03/16/2014 1708   TRIG 133 05/29/2013 1131   HDL 44.00 03/25/2023 1528   HDL 56 06/21/2014 1449   HDL 55 03/16/2014 1708   HDL 48 05/29/2013 1131   CHOLHDL 5 03/25/2023 1528   VLDL 24.6 03/25/2023 1528   LDLCALC 143 (H) 03/25/2023 1528   LDLCALC 118 (H) 08/22/2020 1321   LDLCALC 77 03/16/2014 1708   LDLCALC 73 05/29/2013 1131   LDLDIRECT 168.0 11/20/2022 1615    History of Present Illness    89 year old female with the above past medial history including persistent atrial fibrillation, chronic diastolic heart failure, hypertension, hyperlipidemia, chronic renal insufficiency, and type 2 diabetes.   She was evaluated by Dr. Tomie China in 2022 in the setting of palpitations. Cardiac monitor in 07/2021 revealed PSVT.  Echo at the time showed severe concentric LVH, G3 DD, normal RV systolic function, no significant valvular abnormalities.  She was hospitalized in January 2024 in the setting of acute respiratory failure secondary to RSV, new atrial fibrillation with RVR.  Cardiology was consulted.  TSH was normal.  Repeat echocardiogram was normal.  She was started on Eliquis and metoprolol.  Given advanced age, renal dysfunction, borderline hypotension, rate control strategy was recommended.  She returned to the ED on 03/13/2023 with generalized weakness-EKG showed atrial fibrillation.  She had a brief period of tachycardia in the setting of having missed her evening dose of beta-blocker.  She was discharged home in stable condition. She was last seen in the office on 03/21/2023 and noted ongoing dyspnea on exertion, fatigue, poor appetite, generalized weakness, and low BP.  She  declined DCCV or any other invasive procedures.  Metoprolol was increased to 25 mg twice daily.  It was noted that she remained in atrial fibrillation, amiodarone 200 mg daily could be considered. Outpatient follow-up with palliative care was recommended.  She presented to the  ED on 03/28/2023 with lower extremity edema.  She was given IV Lasix and discharged home.  She returned to the ED on 04/04/2023 with ongoing lower extremity edema.  Lasix was increased to 40 mg daily.  She returned to the ED on 04/06/2023 with concern for possible cellulitis.  Exam was unremarkable and she was discharged home and advised to follow-up with her PCP.    She presents today for follow-up accompanied by her daughter. Since her last visit she has been stable from a cardiac standpoint.  She reports a recent increase in falls due to generalized weakness, weakness in her legs (she notes that leg weakness has been an ongoing problem for over 3 years).  She denies any dizziness, presyncope, syncope.  She denies any significant head injury, denies bleeding.  BP and heart rate have been stable.  She denies any palpitations, dyspnea.  Overall, her lower extremity edema has improved though she states she is pending venous reflux study and follow-up with vascular discussed her ongoing lower extremity edema.  She is taking Lasix as needed.  Overall, her symptoms have been stable.     Home Medications    Current Outpatient Medications  Medication Sig Dispense Refill   acetaminophen (TYLENOL) 500 MG tablet Take 2 tablets (1,000 mg total) by mouth every 8 (eight) hours. 30 tablet 0   Cholecalciferol (VITAMIN D3) 50 MCG (2000 UT) capsule Take 2,000 Units by mouth daily.      diazepam (VALIUM) 5 MG tablet TAKE 1/2 TO 1 TABLET DAILY AS NEEDED FOR ANXIETY 30 tablet 1   gabapentin (NEURONTIN) 800 MG tablet Take 1 tablet (800 mg total) by mouth 3 (three) times daily. (Patient taking differently: Take 800 mg by mouth. 2 or 3 times a day) 270 tablet  1   metoprolol tartrate (LOPRESSOR) 25 MG tablet Take 1 tablet twice daily. Please hold medication on the days systolic blood pressure (top number) is less than 90. 180 tablet 3   mometasone-formoterol (DULERA) 200-5 MCG/ACT AERO Inhale 2 puffs into the lungs 2 (two) times daily. (Patient taking differently: Inhale 2 puffs into the lungs as needed for wheezing or shortness of breath.) 1 each 2   rOPINIRole (REQUIP) 1 MG tablet TAKE 1 TABLET BY MOUTH AT BEDTIME. 90 tablet 1   tiZANidine (ZANAFLEX) 4 MG tablet Take 1 tablet (4 mg total) by mouth 2 (two) times daily as needed for muscle spasms. (Patient taking differently: Take 4 mg by mouth at bedtime.) 180 tablet 1   venlafaxine XR (EFFEXOR-XR) 37.5 MG 24 hr capsule TAKE 1 CAPSULE BY MOUTH DAILY WITH BREAKFAST. 90 capsule 1   apixaban (ELIQUIS) 2.5 MG TABS tablet Take 1 tablet (2.5 mg total) by mouth 2 (two) times daily. 60 tablet 3   famotidine (PEPCID) 40 MG tablet TAKE 1 TABLET BY MOUTH EVERYDAY AT BEDTIME (Patient not taking: Reported on 06/13/2023) 90 tablet 1   furosemide (LASIX) 20 MG tablet Take 1 tablet (20 mg total) by mouth daily. Take for 3 days, then switch to as needed use for swelling associated with weight gain of 3 pounds in 24 hours or 6 pounds in a week. Let us know if you are having to use frequently so we can monitor kidney function. (Patient taking differently: Take 20 mg by mouth daily as needed.) 30 tablet 0   No current facility-administered medications for this visit.     Review of Systems    She denies chest pain, palpitations, dyspnea, pnd, orthopnea, n, v, dizziness,  syncope, weight gain, or early satiety. All other systems reviewed and are otherwise negative except as noted above.   Physical Exam    VS:  BP 128/84   Pulse 95   Ht 5\' 6"  (1.676 m)   Wt 127 lb (57.6 kg)   SpO2 95%   BMI 20.50 kg/m   GEN: Well nourished, well developed, in no acute distress. HEENT: normal. Neck: Supple, no JVD, carotid bruits, or  masses. Cardiac: IRIR, no murmurs, rubs, or gallops. No clubbing, cyanosis, nonpitting bilateral lower extremity edema.  Radials/DP/PT 2+ and equal bilaterally.  Respiratory:  Respirations regular and unlabored, clear to auscultation bilaterally. GI: Soft, nontender, nondistended, BS + x 4. MS: no deformity or atrophy. Skin: warm and dry, no rash. Neuro:  Strength and sensation are intact. Psych: Normal affect.  Accessory Clinical Findings    ECG personally reviewed by me today -atrial fibrillation, 89 bpm, incomplete RBBB-- no acute changes.   Lab Results  Component Value Date   WBC 6.0 03/28/2023   HGB 12.5 03/28/2023   HCT 39.5 03/28/2023   MCV 87.4 03/28/2023   PLT 251 03/28/2023   Lab Results  Component Value Date   CREATININE 1.56 (H) 05/13/2023   BUN 30 (H) 05/13/2023   NA 139 05/13/2023   K 4.3 05/13/2023   CL 100 05/13/2023   CO2 29 05/13/2023   Lab Results  Component Value Date   ALT 15 03/27/2023   AST 19 03/27/2023   ALKPHOS 51 03/27/2023   BILITOT 0.6 03/27/2023   Lab Results  Component Value Date   CHOL 212 (H) 03/25/2023   HDL 44.00 03/25/2023   LDLCALC 143 (H) 03/25/2023   LDLDIRECT 168.0 11/20/2022   TRIG 123.0 03/25/2023   CHOLHDL 5 03/25/2023    Lab Results  Component Value Date   HGBA1C 6.6 (H) 03/25/2023    Assessment & Plan    1. Persistent atrial fibrillation/history of falls: Diagnosed in January 2024. On Eliquis and metoprolol.  Rate control strategy was recommended.  She is overall asymptomatic as long as heart rate is well-controlled.  She denies any significant palpitations, denies dyspnea, worsening edema, PND, orthopnea, weight gain.  She has had an increase in falls over the past several months.  She denies head injury.  We discussed the benefits versus risks of ongoing anticoagulation therapy.  Patient and daughter prefer to continue Eliquis at this time.  Discussed ED precautions.  Continue metoprolol.  2. Chronic diastolic heart  failure: Recent echo in 12/2022 was essentially normal.  Has nonpitting bilateral extremity edema, overall stable.  Otherwise, euvolemic and well compensated on exam.  Continue Lasix as needed.  3. Hypertension: BP well controlled. Continue current antihypertensive regimen.    4. CKD stage IIIb: Creatinine was 1.56 in 05/2023.  Monitored per PCP.  5. Type 2 diabetes: A1C was 6.6 in 03/2023.  Monitored and managed per PCP.   6. Disposition: Follow-up in 3 to 4 months.     Joylene Grapes, NP 06/13/2023, 5:24 PM

## 2023-06-17 ENCOUNTER — Ambulatory Visit: Payer: Self-pay

## 2023-06-17 NOTE — Patient Outreach (Signed)
  Care Coordination   Follow Up Visit Note   06/17/2023 Name: Dana Burns MRN: [id number] DOB: 07-01-1926  Dana Burns is a 89 y.o. year old female who sees Bradd Canary, MD for primary care. I spoke with  Dana Burns and daughter Dana Burns by phone today.  What matters to the patients health and wellness today?  Patient reports although better she continues to have swelling leg/feet. She has vascular imaging scheduled for tomorrow and appointment with PCP on 07/01/23. Daughter with no questions at this time.   Goals Addressed             This Visit's Progress    continue to improve post hospitalization       Interventions Today    Flowsheet Row Most Recent Value  Chronic Disease   Chronic disease during today's visit Congestive Heart Failure (CHF)  General Interventions   General Interventions Discussed/Reviewed General Interventions Reviewed, Doctor Visits  Doctor Visits Discussed/Reviewed Doctor Visits Reviewed, Specialist  [reviewed upcoming/scheduled visits including imaging scheduled for tomorrow, reviewed instructions per cardiology visit on 06/13/23.]  PCP/Specialist Visits Compliance with follow-up visit  Exercise Interventions   Exercise Discussed/Reviewed Physical Activity  [encouraged to remain as active as tolerated]  Education Interventions   Education Provided Provided Education  Provided Verbal Education On When to see the doctor, Medication, Exercise  [discussed importance of remaining active, advised to continue to follow up with provider as recommended, encouraged continue elevate legs, and contact provider if condition worsens]  Pharmacy Interventions   Pharmacy Dicussed/Reviewed Pharmacy Topics Reviewed  Safety Interventions   Safety Discussed/Reviewed Safety Reviewed            SDOH assessments and interventions completed:  No  Care Coordination Interventions:  Yes, provided   Follow up plan: Follow up call scheduled for 07/15/23     Encounter Outcome:  Pt. Visit Completed   Kathyrn Sheriff, RN, MSN, BSN, CCM [facility] Care Coordinator [phone]

## 2023-06-17 NOTE — Patient Instructions (Signed)
Visit Information  Thank you for taking time to visit with me today. Please don't hesitate to contact me if I can be of assistance to you.   Following are the goals we discussed today:  Continue to take medications as prescribed Continue to attend provider visits as scheduled Continue to weigh self daily. Notify cardiologist if any signs/symptoms of HF exacerbation: weight gain 3 pounds in 24 hour period or 5 pounds in a week. Increased swelling of feet/ankles/stomach/hands; increased Shortness of breath, hard to breath laying down and you have to sit up to breath or increase in number of pillows needed, increased fatigue; you feel something is not right or any questions or concerns Continue to remain as active as tolerated, elevate legs when sitting.   Our next appointment is by telephone on 07/15/23 at 1:30 pm  Please call the care guide team at [phone] if you need to cancel or reschedule your appointment.   If you are experiencing a Mental Health or Behavioral Health Crisis or need someone to talk to, please call the Suicide and Crisis Lifeline: 74  Kathyrn Sheriff, RN, MSN, BSN, CCM [facility] Care Coordinator [phone]

## 2023-06-18 ENCOUNTER — Ambulatory Visit (HOSPITAL_COMMUNITY)
Admission: RE | Admit: 2023-06-18 | Discharge: 2023-06-18 | Disposition: A | Discharge status: Home / Self Care | Payer: Medicare Other | Source: Ambulatory Visit | Attending: Physician Assistant | Admitting: Physician Assistant

## 2023-06-18 DIAGNOSIS — R600 Localized edema: Secondary | ICD-10-CM | POA: Diagnosis not present

## 2023-06-25 ENCOUNTER — Other Ambulatory Visit: Payer: Medicare Other

## 2023-06-30 NOTE — Assessment & Plan Note (Signed)
Tolerating Eliquis and rate controlled 

## 2023-06-30 NOTE — Progress Notes (Unsigned)
Subjective:    Patient ID: Dana Burns, female    DOB: 11/10/1926, 89 y.o.   MRN: [id number]  No chief complaint on file.   HPI Discussed the use of AI scribe software for clinical note transcription with the patient, who gave verbal consent to proceed.  History of Present Illness        Patient is a 89 yo female in today for follow up on chronic medical concerns. No recent febrile illness or hospitalizations. Denies CP/palp/SOB/HA/congestion/fevers/GI or GU c/o. Taking meds as prescribed     Past Medical History:  Diagnosis Date   Abdominal pain 10-09-17   Allergy    sneeze, rhinorrhea in am   Anxiety state 09/03/2013   Breast mass, right 10/14/2012   Chicken pox as a child   Chronic pain syndrome 01/23/2015   Chronic renal insufficiency 03/17/2011   Colon cancer (HCC)    Complication of anesthesia    agitated when waking up, wakes up shaking, "like  I'm going into shock"   Constipation 2017/10/09   Depression    husband died  3 months ago, pt. admits depression  currently    Diabetic neuropathy (HCC) 03/17/2011   DM (diabetes mellitus) type 2, uncontrolled, with ketoacidosis (HCC) 03/17/2011   Ductal hyperplasia of breast 03/17/2011   Essential hypertension, benign 05/31/2010   Qualifier: Diagnosis of  By: Antoine Poche, MD, Alinda Dooms 07/15/2021   Fibromyalgia    GERD (gastroesophageal reflux disease)    H/O echocardiogram    done /w [place] in 2011, saw Dr. Antoine Poche for tachycardia    Headache    History of colon cancer 03/17/2011   Hyperlipidemia    6 years   Hyperlipidemia, mixed    6 years   Hypertension    15 years, reports she has a rapid heartrate    Loss of weight 12/14/2013   Measles as a child   Osteoarthritis (arthritis due to wear and tear of joints) 03/17/2011   Noted diffusely including neck   Osteopenia 12/14/2013   Pain in finger of right hand 05/15/2017   Pain in joint, lower leg 09/03/2013   Pain of right shoulder region 07/07/2014    Peripheral neuropathy    SOB (shortness of breath) 05/31/2010   Qualifier: Diagnosis of  By: Antoine Poche, MD, Gerrit Heck     Stress 06/01/2013   Tachycardia 05/31/2010   Qualifier: Diagnosis of  By: Antoine Poche, MD, Gerrit Heck     Type 2 diabetes mellitus with peripheral neuropathy (HCC) 03/17/2011   Vitamin B 12 deficiency 07/07/2014   intrinsic factor positive    Past Surgical History:  Procedure Laterality Date   BREAST EXCISIONAL BIOPSY Right 2013   benign   BREAST SURGERY  2013   right- benign   CATARACT EXTRACTION     /w IOL- bilateral    COLON SURGERY     For resection of cancer   Nodule removed     From throat    Family History  Problem Relation Age of Onset   Cancer Mother        Brain   Cancer Father        Colorectal   Cancer Sister        breast   Other Brother        pacemaker   Cancer Brother        pancreatic cancer   Arthritis Sister    Emphysema Brother    Cancer Brother    COPD  Brother    Neuropathy Daughter    Fibromyalgia Daughter    Cancer Brother        metastatic colon cancer   Heart disease Neg Hx        Early    Social History   Socioeconomic History   Marital status: Widowed    Spouse name: Not on file   Number of children: 1   Years of education: 9   Highest education level: Not on file  Occupational History   Occupation: Retired  Tobacco Use   Smoking status: Never   Smokeless tobacco: Never  Substance and Sexual Activity   Alcohol use: No   Drug use: No   Sexual activity: Never    Comment: lives with daughter. no dietary restrictions  Other Topics Concern   Not on file  Social History Narrative   Patient lives at home with daughter.    Patient is retired.    Patient is widowed.    Patient has 1 child.    Patient has a 9th grade education.    Social Determinants of Health   Financial Resource Strain: Low Risk  (03/01/2022)   Overall Financial Resource Strain (CARDIA)    Difficulty of Paying Living Expenses: Not hard at all   Food Insecurity: No Food Insecurity (04/18/2023)   Hunger Vital Sign    Worried About Running Out of Food in the Last Year: Never true    Ran Out of Food in the Last Year: Never true  Transportation Needs: No Transportation Needs (04/09/2023)   PRAPARE - Administrator, Civil Service (Medical): No    Lack of Transportation (Non-Medical): No  Physical Activity: Inactive (03/01/2022)   Exercise Vital Sign    Days of Exercise per Week: 0 days    Minutes of Exercise per Session: 0 min  Stress: No Stress Concern Present (03/01/2022)   [employer] of Occupational Health - Occupational Stress Questionnaire    Feeling of Stress : Not at all  Social Connections: Socially Isolated (03/26/2023)   Social Connection and Isolation Panel [NHANES]    Frequency of Communication with Friends and Family: More than three times a week    Frequency of Social Gatherings with Friends and Family: More than three times a week    Attends Religious Services: Never    Database administrator or Organizations: No    Attends Banker Meetings: Never    Marital Status: Widowed  Intimate Partner Violence: Not At Risk (03/28/2023)   Humiliation, Afraid, Rape, and Kick questionnaire    Fear of Current or Ex-Partner: No    Emotionally Abused: No    Physically Abused: No    Sexually Abused: No    Outpatient Medications Prior to Visit  Medication Sig Dispense Refill   acetaminophen (TYLENOL) 500 MG tablet Take 2 tablets (1,000 mg total) by mouth every 8 (eight) hours. 30 tablet 0   apixaban (ELIQUIS) 2.5 MG TABS tablet Take 1 tablet (2.5 mg total) by mouth 2 (two) times daily. 60 tablet 3   Cholecalciferol (VITAMIN D3) 50 MCG (2000 UT) capsule Take 2,000 Units by mouth daily.      diazepam (VALIUM) 5 MG tablet TAKE 1/2 TO 1 TABLET DAILY AS NEEDED FOR ANXIETY 30 tablet 1   famotidine (PEPCID) 40 MG tablet TAKE 1 TABLET BY MOUTH EVERYDAY AT BEDTIME (Patient not taking: Reported on 06/13/2023) 90  tablet 1   furosemide (LASIX) 20 MG tablet Take 1 tablet (20 mg total)  by mouth daily. Take for 3 days, then switch to as needed use for swelling associated with weight gain of 3 pounds in 24 hours or 6 pounds in a week. Let us know if you are having to use frequently so we can monitor kidney function. (Patient taking differently: Take 20 mg by mouth daily as needed.) 30 tablet 0   gabapentin (NEURONTIN) 800 MG tablet Take 1 tablet (800 mg total) by mouth 3 (three) times daily. (Patient taking differently: Take 800 mg by mouth. 2 or 3 times a day) 270 tablet 1   metoprolol tartrate (LOPRESSOR) 25 MG tablet Take 1 tablet twice daily. Please hold medication on the days systolic blood pressure (top number) is less than 90. 180 tablet 3   mometasone-formoterol (DULERA) 200-5 MCG/ACT AERO Inhale 2 puffs into the lungs 2 (two) times daily. (Patient taking differently: Inhale 2 puffs into the lungs as needed for wheezing or shortness of breath.) 1 each 2   rOPINIRole (REQUIP) 1 MG tablet TAKE 1 TABLET BY MOUTH AT BEDTIME. 90 tablet 1   tiZANidine (ZANAFLEX) 4 MG tablet Take 1 tablet (4 mg total) by mouth 2 (two) times daily as needed for muscle spasms. (Patient taking differently: Take 4 mg by mouth at bedtime.) 180 tablet 1   venlafaxine XR (EFFEXOR-XR) 37.5 MG 24 hr capsule TAKE 1 CAPSULE BY MOUTH DAILY WITH BREAKFAST. 90 capsule 1   No facility-administered medications prior to visit.    Allergies  Allergen Reactions   Tape Itching and Rash   Actos [Pioglitazone Hydrochloride] Other (See Comments)    Unknown reaction   Buspar [Buspirone Hcl] Other (See Comments)    Unknown reaction   Doxycycline Other (See Comments)    Made the patient "feel funny"- in a negative way   Lipitor [Atorvastatin Calcium] Other (See Comments)    "It made me hurt all over."   Penicillins Rash    Review of Systems  Constitutional:  Negative for fever and malaise/fatigue.  HENT:  Negative for congestion.   Eyes:   Negative for blurred vision.  Respiratory:  Negative for shortness of breath.   Cardiovascular:  Negative for chest pain, palpitations and leg swelling.  Gastrointestinal:  Negative for abdominal pain, blood in stool and nausea.  Genitourinary:  Negative for dysuria and frequency.  Musculoskeletal:  Negative for falls.  Skin:  Negative for rash.  Neurological:  Negative for dizziness, loss of consciousness and headaches.  Endo/Heme/Allergies:  Negative for environmental allergies.  Psychiatric/Behavioral:  Negative for depression. The patient is not nervous/anxious.        Objective:    Physical Exam Constitutional:      General: She is not in acute distress.    Appearance: Normal appearance. She is well-developed. She is not toxic-appearing.  HENT:     Head: Normocephalic and atraumatic.     Right Ear: External ear normal.     Left Ear: External ear normal.     Nose: Nose normal.  Eyes:     General:        Right eye: No discharge.        Left eye: No discharge.     Conjunctiva/sclera: Conjunctivae normal.  Neck:     Thyroid: No thyromegaly.  Cardiovascular:     Rate and Rhythm: Normal rate and regular rhythm.     Heart sounds: Normal heart sounds. No murmur heard. Pulmonary:     Effort: Pulmonary effort is normal. No respiratory distress.     Breath sounds:  Normal breath sounds.  Abdominal:     General: Bowel sounds are normal.     Palpations: Abdomen is soft.     Tenderness: There is no abdominal tenderness. There is no guarding.  Musculoskeletal:        General: Normal range of motion.     Cervical back: Neck supple.  Lymphadenopathy:     Cervical: No cervical adenopathy.  Skin:    General: Skin is warm and dry.  Neurological:     Mental Status: She is alert and oriented to person, place, and time.  Psychiatric:        Mood and Affect: Mood normal.        Behavior: Behavior normal.        Thought Content: Thought content normal.        Judgment: Judgment  normal.     There were no vitals taken for this visit. Wt Readings from Last 3 Encounters:  06/13/23 127 lb (57.6 kg)  05/13/23 125 lb (56.7 kg)  04/17/23 132 lb (59.9 kg)    Diabetic Foot Exam - Simple   No data filed    Lab Results  Component Value Date   WBC 6.0 03/28/2023   HGB 12.5 03/28/2023   HCT 39.5 03/28/2023   PLT 251 03/28/2023   GLUCOSE 111 (H) 05/13/2023   CHOL 212 (H) 03/25/2023   TRIG 123.0 03/25/2023   HDL 44.00 03/25/2023   LDLDIRECT 168.0 11/20/2022   LDLCALC 143 (H) 03/25/2023   ALT 15 03/27/2023   AST 19 03/27/2023   NA 139 05/13/2023   K 4.3 05/13/2023   CL 100 05/13/2023   CREATININE 1.56 (H) 05/13/2023   BUN 30 (H) 05/13/2023   CO2 29 05/13/2023   TSH 3.05 03/25/2023   HGBA1C 6.6 (H) 03/25/2023   MICROALBUR 1.0 03/28/2021    Lab Results  Component Value Date   TSH 3.05 03/25/2023   Lab Results  Component Value Date   WBC 6.0 03/28/2023   HGB 12.5 03/28/2023   HCT 39.5 03/28/2023   MCV 87.4 03/28/2023   PLT 251 03/28/2023   Lab Results  Component Value Date   NA 139 05/13/2023   K 4.3 05/13/2023   CO2 29 05/13/2023   GLUCOSE 111 (H) 05/13/2023   BUN 30 (H) 05/13/2023   CREATININE 1.56 (H) 05/13/2023   BILITOT 0.6 03/27/2023   ALKPHOS 51 03/27/2023   AST 19 03/27/2023   ALT 15 03/27/2023   PROT 5.9 (L) 03/27/2023   ALBUMIN 3.3 (L) 03/27/2023   CALCIUM 9.5 05/13/2023   ANIONGAP 12 04/04/2023   GFR 28.16 (L) 05/13/2023   Lab Results  Component Value Date   CHOL 212 (H) 03/25/2023   Lab Results  Component Value Date   HDL 44.00 03/25/2023   Lab Results  Component Value Date   LDLCALC 143 (H) 03/25/2023   Lab Results  Component Value Date   TRIG 123.0 03/25/2023   Lab Results  Component Value Date   CHOLHDL 5 03/25/2023   Lab Results  Component Value Date   HGBA1C 6.6 (H) 03/25/2023       Assessment & Plan:  Essential hypertension, benign Assessment & Plan: Well controlled, no changes to meds.  Encouraged heart healthy diet such as the DASH diet and exercise as tolerated.     Type 2 diabetes mellitus with peripheral neuropathy (HCC) Assessment & Plan: hgba1c acceptable, minimize simple carbs. Increase exercise as tolerated. Continue current meds    Chronic renal impairment, unspecified  CKD stage Assessment & Plan: Hydrate and monitor follows with [facility]   Vitamin B 12 deficiency Assessment & Plan: Supplement and monitor    Chronic atrial fibrillation [facility]) Assessment & Plan: Tolerating Eliquis and rate controlled   Chronic kidney disease, stage 3b (HCC) Assessment & Plan: Hydrate and monitor    Protein-calorie malnutrition, severe Assessment & Plan: Continue efforts to maintain and increase protein intake   Congestive heart failure, unspecified HF chronicity, unspecified heart failure type [facility]) Assessment & Plan: Follows with cardiology stable on current meds.      Assessment and Plan              Danise Edge, MD

## 2023-06-30 NOTE — Assessment & Plan Note (Signed)
Continue efforts to maintain and increase protein intake

## 2023-06-30 NOTE — Assessment & Plan Note (Signed)
Follows with cardiology stable on current meds.

## 2023-06-30 NOTE — Assessment & Plan Note (Signed)
Hydrate and monitor follows with  [facility] 

## 2023-06-30 NOTE — Assessment & Plan Note (Signed)
Hydrate and monitor 

## 2023-06-30 NOTE — Assessment & Plan Note (Signed)
Supplement and monitor 

## 2023-06-30 NOTE — Assessment & Plan Note (Signed)
Well controlled, no changes to meds. Encouraged heart healthy diet such as the DASH diet and exercise as tolerated.  °

## 2023-06-30 NOTE — Assessment & Plan Note (Signed)
hgba1c acceptable, minimize simple carbs. Increase exercise as tolerated. Continue current meds 

## 2023-07-01 ENCOUNTER — Ambulatory Visit (INDEPENDENT_AMBULATORY_CARE_PROVIDER_SITE_OTHER): Payer: Medicare Other | Admitting: Family Medicine

## 2023-07-01 ENCOUNTER — Encounter: Payer: Self-pay | Admitting: Family Medicine

## 2023-07-01 VITALS — BP 128/64 | HR 68 | Temp 98.3°F | Resp 20 | Wt 126.0 lb

## 2023-07-01 DIAGNOSIS — I10 Essential (primary) hypertension: Secondary | ICD-10-CM

## 2023-07-01 DIAGNOSIS — I509 Heart failure, unspecified: Secondary | ICD-10-CM

## 2023-07-01 DIAGNOSIS — E538 Deficiency of other specified B group vitamins: Secondary | ICD-10-CM

## 2023-07-01 DIAGNOSIS — E43 Unspecified severe protein-calorie malnutrition: Secondary | ICD-10-CM

## 2023-07-01 DIAGNOSIS — R296 Repeated falls: Secondary | ICD-10-CM

## 2023-07-01 DIAGNOSIS — Z79899 Other long term (current) drug therapy: Secondary | ICD-10-CM

## 2023-07-01 DIAGNOSIS — I482 Chronic atrial fibrillation, unspecified: Secondary | ICD-10-CM

## 2023-07-01 DIAGNOSIS — E1142 Type 2 diabetes mellitus with diabetic polyneuropathy: Secondary | ICD-10-CM | POA: Diagnosis not present

## 2023-07-01 DIAGNOSIS — N189 Chronic kidney disease, unspecified: Secondary | ICD-10-CM

## 2023-07-01 DIAGNOSIS — L89212 Pressure ulcer of right hip, stage 2: Secondary | ICD-10-CM | POA: Diagnosis not present

## 2023-07-01 DIAGNOSIS — N1832 Chronic kidney disease, stage 3b: Secondary | ICD-10-CM

## 2023-07-01 DIAGNOSIS — F419 Anxiety disorder, unspecified: Secondary | ICD-10-CM

## 2023-07-01 DIAGNOSIS — R4181 Age-related cognitive decline: Secondary | ICD-10-CM | POA: Insufficient documentation

## 2023-07-01 MED ORDER — CEPHALEXIN 500 MG PO CAPS: 500.0000 mg | ORAL_CAPSULE | Freq: Three times a day (TID) | ORAL | 0 refills | Status: DC

## 2023-07-01 MED ORDER — CARRASYN HYDROGEL WOUND DRESS EX GEL: CUTANEOUS | 0 refills | Status: DC | PRN

## 2023-07-01 NOTE — Assessment & Plan Note (Signed)
Patient and caregiver both express some concern about progressive difficulty with memory loss will refer to neurology for further consideration and evaluation.

## 2023-07-01 NOTE — Assessment & Plan Note (Signed)
And debility. She has not had a fall when using her walker so she is encouraged to use it more and consider a light weight electric wheelchair

## 2023-07-01 NOTE — Assessment & Plan Note (Addendum)
Sent in Keflex tid and Allantoin gel to apply to wound daily. Keep clean and dry and if does not resolve need to consider wound clinic.

## 2023-07-01 NOTE — Patient Instructions (Addendum)
[place] Wheelchair, light weight electric wheelchair   Hypertension, Adult High blood pressure (hypertension) is when the force of blood pumping through the arteries is too strong. The arteries are the blood vessels that carry blood from the heart throughout the body. Hypertension forces the heart to work harder to pump blood and may cause arteries to become narrow or stiff. Untreated or uncontrolled hypertension can lead to a heart attack, heart failure, a stroke, kidney disease, and other problems. A blood pressure reading consists of a higher number over a lower number. Ideally, your blood pressure should be below 120/80. The first ("top") number is called the systolic pressure. It is a measure of the pressure in your arteries as your heart beats. The second ("bottom") number is called the diastolic pressure. It is a measure of the pressure in your arteries as the heart relaxes. What are the causes? The exact cause of this condition is not known. There are some conditions that result in high blood pressure. What increases the risk? Certain factors may make you more likely to develop high blood pressure. Some of these risk factors are under your control, including: Smoking. Not getting enough exercise or physical activity. Being overweight. Having too much fat, sugar, calories, or salt (sodium) in your diet. Drinking too much alcohol. Other risk factors include: Having a personal history of heart disease, diabetes, high cholesterol, or kidney disease. Stress. Having a family history of high blood pressure and high cholesterol. Having obstructive sleep apnea. Age. The risk increases with age. What are the signs or symptoms? High blood pressure may not cause symptoms. Very high blood pressure (hypertensive crisis) may cause: Headache. Fast or irregular heartbeats (palpitations). Shortness of breath. Nosebleed. Nausea and vomiting. Vision changes. Severe chest pain, dizziness, and  seizures. How is this diagnosed? This condition is diagnosed by measuring your blood pressure while you are seated, with your arm resting on a flat surface, your legs uncrossed, and your feet flat on the floor. The cuff of the blood pressure monitor will be placed directly against the skin of your upper arm at the level of your heart. Blood pressure should be measured at least twice using the same arm. Certain conditions can cause a difference in blood pressure between your right and left arms. If you have a high blood pressure reading during one visit or you have normal blood pressure with other risk factors, you may be asked to: Return on a different day to have your blood pressure checked again. Monitor your blood pressure at home for 1 week or longer. If you are diagnosed with hypertension, you may have other blood or imaging tests to help your health care provider understand your overall risk for other conditions. How is this treated? This condition is treated by making healthy lifestyle changes, such as eating healthy foods, exercising more, and reducing your alcohol intake. You may be referred for counseling on a healthy diet and physical activity. Your health care provider may prescribe medicine if lifestyle changes are not enough to get your blood pressure under control and if: Your systolic blood pressure is above 130. Your diastolic blood pressure is above 80. Your personal target blood pressure may vary depending on your medical conditions, your age, and other factors. Follow these instructions at home: Eating and drinking  Eat a diet that is high in fiber and potassium, and low in sodium, added sugar, and fat. An example of this eating plan is called the DASH diet. DASH stands for Dietary  Approaches to Stop Hypertension. To eat this way: Eat plenty of fresh fruits and vegetables. Try to fill one half of your plate at each meal with fruits and vegetables. Eat whole grains, such as  whole-wheat pasta, brown rice, or whole-grain bread. Fill about one fourth of your plate with whole grains. Eat or drink low-fat dairy products, such as skim milk or low-fat yogurt. Avoid fatty cuts of meat, processed or cured meats, and poultry with skin. Fill about one fourth of your plate with lean proteins, such as fish, chicken without skin, beans, eggs, or tofu. Avoid pre-made and processed foods. These tend to be higher in sodium, added sugar, and fat. Reduce your daily sodium intake. Many people with hypertension should eat less than 1,500 mg of sodium a day. Do not drink alcohol if: Your health care provider tells you not to drink. You are pregnant, may be pregnant, or are planning to become pregnant. If you drink alcohol: Limit how much you have to: 0-1 drink a day for women. 0-2 drinks a day for men. Know how much alcohol is in your drink. In the [place] one drink equals one 12 oz bottle of beer (355 mL), one 5 oz glass of wine (148 mL), or one 1 oz glass of hard liquor (44 mL). Lifestyle  Work with your health care provider to maintain a healthy body weight or to lose weight. Ask what an ideal weight is for you. Get at least 30 minutes of exercise that causes your heart to beat faster (aerobic exercise) most days of the week. Activities may include walking, swimming, or biking. Include exercise to strengthen your muscles (resistance exercise), such as Pilates or lifting weights, as part of your weekly exercise routine. Try to do these types of exercises for 30 minutes at least 3 days a week. Do not use any products that contain nicotine or tobacco. These products include cigarettes, chewing tobacco, and vaping devices, such as e-cigarettes. If you need help quitting, ask your health care provider. Monitor your blood pressure at home as told by your health care provider. Keep all follow-up visits. This is important. Medicines Take over-the-counter and prescription medicines only as  told by your health care provider. Follow directions carefully. Blood pressure medicines must be taken as prescribed. Do not skip doses of blood pressure medicine. Doing this puts you at risk for problems and can make the medicine less effective. Ask your health care provider about side effects or reactions to medicines that you should watch for. Contact a health care provider if you: Think you are having a reaction to a medicine you are taking. Have headaches that keep coming back (recurring). Feel dizzy. Have swelling in your ankles. Have trouble with your vision. Get help right away if you: Develop a severe headache or confusion. Have unusual weakness or numbness. Feel faint. Have severe pain in your chest or abdomen. Vomit repeatedly. Have trouble breathing. These symptoms may be an emergency. Get help right away. Call 911. Do not wait to see if the symptoms will go away. Do not drive yourself to the hospital. Summary Hypertension is when the force of blood pumping through your arteries is too strong. If this condition is not controlled, it may put you at risk for serious complications. Your personal target blood pressure may vary depending on your medical conditions, your age, and other factors. For most people, a normal blood pressure is less than 120/80. Hypertension is treated with lifestyle changes, medicines, or a combination of  both. Lifestyle changes include losing weight, eating a healthy, low-sodium diet, exercising more, and limiting alcohol. This information is not intended to replace advice given to you by your health care provider. Make sure you discuss any questions you have with your health care provider. Document Revised: 10/24/2021 Document Reviewed: 10/24/2021 Elsevier Patient Education  2024 [employer].

## 2023-07-02 ENCOUNTER — Telehealth: Payer: Self-pay | Admitting: *Deleted

## 2023-07-02 ENCOUNTER — Telehealth: Payer: Self-pay | Admitting: Family Medicine

## 2023-07-02 LAB — COMPREHENSIVE METABOLIC PANEL
ALT: 7 U/L (ref 0–35)
AST: 14 U/L (ref 0–37)
Albumin: 4 g/dL (ref 3.5–5.2)
Alkaline Phosphatase: 68 U/L (ref 39–117)
BUN: 34 mg/dL — ABNORMAL HIGH (ref 6–23)
CO2: 29 mEq/L (ref 19–32)
Calcium: 9.6 mg/dL (ref 8.4–10.5)
Chloride: 101 mEq/L (ref 96–112)
Creatinine, Ser: 1.38 mg/dL — ABNORMAL HIGH (ref 0.40–1.20)
GFR: 32.59 mL/min — ABNORMAL LOW (ref >60.00)
Glucose, Bld: 119 mg/dL — ABNORMAL HIGH (ref 70–99)
Potassium: 4.3 mEq/L (ref 3.5–5.1)
Sodium: 138 mEq/L (ref 135–145)
Total Bilirubin: 0.3 mg/dL (ref 0.2–1.2)
Total Protein: 6.7 g/dL (ref 6.0–8.3)

## 2023-07-02 LAB — CBC WITH DIFFERENTIAL/PLATELET
Basophils Absolute: 0 10*3/uL (ref 0.0–0.1)
Basophils Relative: 0.5 % (ref 0.0–3.0)
Eosinophils Absolute: 0.1 10*3/uL (ref 0.0–0.7)
Eosinophils Relative: 1.7 % (ref 0.0–5.0)
HCT: 39 % (ref 36.0–46.0)
Hemoglobin: 12.2 g/dL (ref 12.0–15.0)
Lymphocytes Relative: 24.8 % (ref 12.0–46.0)
Lymphs Abs: 1.2 10*3/uL (ref 0.7–4.0)
MCHC: 31.3 g/dL (ref 30.0–36.0)
MCV: 84.1 fl (ref 78.0–100.0)
Monocytes Absolute: 0.5 10*3/uL (ref 0.1–1.0)
Monocytes Relative: 11 % (ref 3.0–12.0)
Neutro Abs: 3.1 10*3/uL (ref 1.4–7.7)
Neutrophils Relative %: 62 % (ref 43.0–77.0)
Platelets: 249 10*3/uL (ref 150.0–400.0)
RBC: 4.64 Mil/uL (ref 3.87–5.11)
RDW: 15.9 % — ABNORMAL HIGH (ref 11.5–15.5)
WBC: 4.9 10*3/uL (ref 4.0–10.5)

## 2023-07-02 LAB — VITAMIN B12: Vitamin B-12: 175 pg/mL — ABNORMAL LOW (ref 211–911)

## 2023-07-02 LAB — LIPID PANEL
Cholesterol: 198 mg/dL (ref 0–200)
HDL: 37.1 mg/dL — ABNORMAL LOW (ref >39.00)
LDL Cholesterol: 131 mg/dL — ABNORMAL HIGH (ref 0–99)
NonHDL: 160.63
Total CHOL/HDL Ratio: 5
Triglycerides: 148 mg/dL (ref 0.0–149.0)
VLDL: 29.6 mg/dL (ref 0.0–40.0)

## 2023-07-02 LAB — TSH: TSH: 2.45 u[IU]/mL (ref 0.35–5.50)

## 2023-07-02 LAB — HEMOGLOBIN A1C: Hgb A1c MFr Bld: 6.5 % (ref 4.6–6.5)

## 2023-07-02 NOTE — Telephone Encounter (Signed)
Pt daughter notified   

## 2023-07-02 NOTE — Telephone Encounter (Signed)
Daughter notified 

## 2023-07-02 NOTE — Telephone Encounter (Signed)
Patients daughter called and wanted to make sure the provider remembered to place the neuro referral as it was her main concern. Patient was seen yesterday.

## 2023-07-02 NOTE — Telephone Encounter (Signed)
From Dr. Abner Greenspan please let Jonna Coup know I ended up sending in a gel for her to apply to her wound daily and some keflex to treat her ulcer. the keflex is very distantly related to PCN but the chances she will have a cross reaction are slight. thanks

## 2023-07-03 LAB — DRUG TOX MONITOR 1 W/CONF, ORAL FLD
Alprazolam: NEGATIVE ng/mL (ref <0.50)
Amphetamines: NEGATIVE ng/mL (ref <10)
Barbiturates: NEGATIVE ng/mL (ref <10)
Benzodiazepines: POSITIVE ng/mL — AB (ref <0.50)
Buprenorphine: NEGATIVE ng/mL (ref <0.10)
Chlordiazepoxide: NEGATIVE ng/mL (ref <0.50)
Clonazepam: NEGATIVE ng/mL (ref <0.50)
Cocaine: NEGATIVE ng/mL (ref <5.0)
Diazepam: 2.48 ng/mL — ABNORMAL HIGH (ref <0.50)
Fentanyl: NEGATIVE ng/mL (ref <0.10)
Flunitrazepam: NEGATIVE ng/mL (ref <0.50)
Flurazepam: NEGATIVE ng/mL (ref <0.50)
Heroin Metabolite: NEGATIVE ng/mL (ref <1.0)
Lorazepam: NEGATIVE ng/mL (ref <0.50)
MARIJUANA: NEGATIVE ng/mL (ref <2.5)
MDMA: NEGATIVE ng/mL (ref <10)
Meprobamate: NEGATIVE ng/mL (ref <2.5)
Methadone: NEGATIVE ng/mL (ref <5.0)
Midazolam: NEGATIVE ng/mL (ref <0.50)
Nicotine Metabolite: NEGATIVE ng/mL (ref <5.0)
Nordiazepam: 5.57 ng/mL — ABNORMAL HIGH (ref <0.50)
Opiates: NEGATIVE ng/mL (ref <2.5)
Oxazepam: NEGATIVE ng/mL (ref <0.50)
Phencyclidine: NEGATIVE ng/mL (ref <10)
Tapentadol: NEGATIVE ng/mL (ref <5.0)
Temazepam: NEGATIVE ng/mL (ref <0.50)
Tramadol: NEGATIVE ng/mL (ref <5.0)
Triazolam: NEGATIVE ng/mL (ref <0.50)
Zolpidem: NEGATIVE ng/mL (ref <5.0)

## 2023-07-15 ENCOUNTER — Ambulatory Visit: Payer: Self-pay

## 2023-07-15 NOTE — Patient Outreach (Signed)
  Care Coordination   Follow Up Visit Note   07/15/2023 Name: Dana Burns MRN: [id number] DOB: 01-27-1926  Dana Burns is a 89 y.o. year old female who sees Dana Canary, MD for primary care. I spoke with daughter, Dana Burns by phone today.  What matters to the patients health and wellness today?  Per daughter Dana Burns, Dana Burns is not feeling well today. She reports started last night with decreased appetite and with some nausea. She states she gave patient some zofran today. Per Dana Burns, Dana Burns is not eating much, but states she is drinking liquids. RNCM encouraged her to contact provider if condition does not improve or gets worse. Dana Burns voiced understanding.  Goals Addressed             This Visit's Progress    continue to improve post hospitalization       Interventions Today    Flowsheet Row Most Recent Value  Chronic Disease   Chronic disease during today's visit Other  General Interventions   General Interventions Discussed/Reviewed General Interventions Reviewed, Communication with  Communication with PCP/Specialists  Dana Burns message to PCP to notify of patient update]  Education Interventions   Education Provided Provided Education  Provided Verbal Education On Other  [advised to provided/encourage fluid intake. advised to contact PCP, if patient does not improve or worsens]            SDOH assessments and interventions completed:  No  Care Coordination Interventions:  Yes, provided   Follow up plan:  cae guide to call and reschedule    Encounter Outcome:  Pt. Visit Completed   Dana Sheriff, RN, MSN, BSN, CCM [facility] Coordinator [phone]

## 2023-07-15 NOTE — Patient Instructions (Signed)
Visit Information  Thank you for taking time to visit with me today. Please don't hesitate to contact me if I can be of assistance to you.   Following are the goals we discussed today:  Contact your PCP or seek medication attention, if condition does not improve or worsens   Care Guide will call you to schedule a telephone follow up  Please call the care guide team at [phone] if you need to cancel or reschedule your appointment.   If you are experiencing a Mental Health or Behavioral Health Crisis or need someone to talk to, please call the Suicide and Crisis Lifeline: 90  Kathyrn Sheriff, RN, MSN, BSN, CCM Care Management Coordinator [phone]

## 2023-07-16 ENCOUNTER — Telehealth: Payer: Self-pay

## 2023-07-16 NOTE — Telephone Encounter (Signed)
Called spoke with pt daughter regarding not feeling well And the daughter stated pt is doing better today. Pt daughter said her appetite is little better and with nausea she hasn't felt sick today and daughter said she think ok for now.

## 2023-07-21 ENCOUNTER — Other Ambulatory Visit: Payer: Self-pay | Admitting: Family Medicine

## 2023-08-05 ENCOUNTER — Other Ambulatory Visit: Payer: Self-pay

## 2023-08-05 ENCOUNTER — Encounter (HOSPITAL_COMMUNITY): Payer: Self-pay | Admitting: Emergency Medicine

## 2023-08-05 ENCOUNTER — Emergency Department (HOSPITAL_COMMUNITY): Payer: Medicare Other

## 2023-08-05 ENCOUNTER — Emergency Department (HOSPITAL_COMMUNITY)
Admission: EM | Admit: 2023-08-05 | Discharge: 2023-08-06 | Disposition: A | Discharge status: Home / Self Care | Payer: Medicare Other

## 2023-08-05 DIAGNOSIS — R410 Disorientation, unspecified: Secondary | ICD-10-CM

## 2023-08-05 DIAGNOSIS — R600 Localized edema: Secondary | ICD-10-CM | POA: Insufficient documentation

## 2023-08-05 DIAGNOSIS — R404 Transient alteration of awareness: Secondary | ICD-10-CM | POA: Diagnosis not present

## 2023-08-05 DIAGNOSIS — R1013 Epigastric pain: Secondary | ICD-10-CM | POA: Diagnosis not present

## 2023-08-05 DIAGNOSIS — F0390 Unspecified dementia without behavioral disturbance: Secondary | ICD-10-CM | POA: Diagnosis not present

## 2023-08-05 DIAGNOSIS — J449 Chronic obstructive pulmonary disease, unspecified: Secondary | ICD-10-CM | POA: Diagnosis not present

## 2023-08-05 DIAGNOSIS — R5381 Other malaise: Secondary | ICD-10-CM

## 2023-08-05 DIAGNOSIS — Z7901 Long term (current) use of anticoagulants: Secondary | ICD-10-CM | POA: Diagnosis not present

## 2023-08-05 DIAGNOSIS — I4811 Longstanding persistent atrial fibrillation: Secondary | ICD-10-CM

## 2023-08-05 DIAGNOSIS — I517 Cardiomegaly: Secondary | ICD-10-CM | POA: Diagnosis not present

## 2023-08-05 DIAGNOSIS — I4891 Unspecified atrial fibrillation: Secondary | ICD-10-CM | POA: Insufficient documentation

## 2023-08-05 DIAGNOSIS — R6889 Other general symptoms and signs: Secondary | ICD-10-CM | POA: Diagnosis not present

## 2023-08-05 DIAGNOSIS — R4182 Altered mental status, unspecified: Secondary | ICD-10-CM | POA: Diagnosis present

## 2023-08-05 DIAGNOSIS — Z743 Need for continuous supervision: Secondary | ICD-10-CM | POA: Diagnosis not present

## 2023-08-05 LAB — CBG MONITORING, ED: Glucose-Capillary: 117 mg/dL — ABNORMAL HIGH (ref 70–99)

## 2023-08-05 NOTE — ED Triage Notes (Signed)
Patient presents due to confusion. Her family believes she has Alzheimer's for the last 3 months, but has not been diagnosed. Family has tried to get her in with a specialist. She becomes confused at night.    HX AFIb    EMS vitals: 155/107 BP 100% SPO2 on room air 100 HR 135 CBG 19 RR

## 2023-08-05 NOTE — ED Provider Notes (Signed)
[place] EMERGENCY DEPARTMENT AT [facility] Provider Note   CSN: [id number] Arrival date & time: 08/05/23  2112     History  Chief Complaint  Patient presents with  . Altered Mental Status    Dana Burns is a 89 y.o. female.  The emergency department for generalized malaise x 1 week and not feeling well.  Patient is demented.  She reports generally feeling pretty well.  Has no specific complaints.  She is alert to person and to place, not to time.  Denies any pain.  No reported recent trauma.  Grandson notes that she has been drinking okay but not eating much.  Maybe she has complained of some intermittent abdominal pain.  He does note that she seemingly has been more confused for the past week, 2 days ago was seemingly hallucinating for that husband.  He reports that she is close to baseline currently.   Altered Mental Status      Home Medications Prior to Admission medications   Medication Sig Start Date End Date Taking? Authorizing Provider  acetaminophen (TYLENOL) 500 MG tablet Take 2 tablets (1,000 mg total) by mouth every 8 (eight) hours. 03/28/23   Barnetta Chapel, MD  apixaban (ELIQUIS) 2.5 MG TABS tablet Take 1 tablet (2.5 mg total) by mouth 2 (two) times daily. 06/13/23   Joylene Grapes, NP  cephALEXin (KEFLEX) 500 MG capsule Take 1 capsule (500 mg total) by mouth 3 (three) times daily. 07/01/23   Bradd Canary, MD  Cholecalciferol (VITAMIN D3) 50 MCG (2000 UT) capsule Take 2,000 Units by mouth daily.     [provider]  diazepam (VALIUM) 5 MG tablet TAKE 1/2 TO 1 TABLET DAILY AS NEEDED FOR ANXIETY 05/20/23   Bradd Canary, MD  famotidine (PEPCID) 40 MG tablet TAKE 1 TABLET BY MOUTH EVERYDAY AT BEDTIME Patient not taking: Reported on 06/13/2023 02/18/23   Bradd Canary, MD  furosemide (LASIX) 20 MG tablet Take 1 tablet (20 mg total) by mouth daily. Take for 3 days, then switch to as needed use for swelling associated with weight gain of 3 pounds  in 24 hours or 6 pounds in a week. Let us know if you are having to use frequently so we can monitor kidney function. Patient taking differently: Take 20 mg by mouth daily as needed. 03/28/23 08/05/23  Berton Mount I, MD  gabapentin (NEURONTIN) 800 MG tablet Take 1 tablet (800 mg total) by mouth 3 (three) times daily. Patient taking differently: Take 800 mg by mouth. 2 or 3 times a day 11/20/22   Bradd Canary, MD  metoprolol tartrate (LOPRESSOR) 25 MG tablet Take 1 tablet twice daily. Please hold medication on the days systolic blood pressure (top number) is less than 90. 03/21/23   Monge, Petra Kuba, NP  mometasone-formoterol (DULERA) 200-5 MCG/ACT AERO Inhale 2 puffs into the lungs 2 (two) times daily. Patient taking differently: Inhale 2 puffs into the lungs as needed for wheezing or shortness of breath. 01/15/23   Kathlen Mody, MD  rOPINIRole (REQUIP) 1 MG tablet TAKE 1 TABLET BY MOUTH AT BEDTIME. 02/18/23   Bradd Canary, MD  tiZANidine (ZANAFLEX) 4 MG tablet Take 1 tablet (4 mg total) by mouth 2 (two) times daily as needed for muscle spasms. Patient taking differently: Take 4 mg by mouth at bedtime. 08/14/22   Bradd Canary, MD  venlafaxine XR (EFFEXOR-XR) 37.5 MG 24 hr capsule TAKE 1 CAPSULE BY MOUTH DAILY WITH BREAKFAST. 02/27/23  Bradd Canary, MD  Wound Dressings (ALLANTOIN) gel Apply topically as needed for wound care. 07/01/23   Bradd Canary, MD      Allergies    Tape, Actos [pioglitazone hydrochloride], Buspar [buspirone hcl], Doxycycline, Lipitor [atorvastatin calcium], and Penicillins    Review of Systems   Review of Systems  Physical Exam Updated Vital Signs BP (!) 147/88   Pulse (!) 104   Temp 97.7 F (36.5 C) (Oral)   Resp 15   SpO2 100%  Physical Exam Vitals and nursing note reviewed.  Constitutional:      General: She is not in acute distress. HENT:     Head: Normocephalic and atraumatic.     Nose: Nose normal.     Mouth/Throat:     Mouth: Mucous  membranes are moist.  Eyes:     Conjunctiva/sclera: Conjunctivae normal.  Cardiovascular:     Rate and Rhythm: Normal rate and regular rhythm.  Pulmonary:     Effort: Pulmonary effort is normal.     Breath sounds: Normal breath sounds.  Abdominal:     General: Abdomen is flat. There is no distension.     Palpations: Abdomen is soft.     Tenderness: There is abdominal tenderness (mild epigastric). There is no guarding or rebound.  Musculoskeletal:        General: Normal range of motion.     Right lower leg: Edema (minro) present.     Left lower leg: Edema (minor) present.  Skin:    General: Skin is warm and dry.     Capillary Refill: Capillary refill takes less than 2 seconds.  Neurological:     General: No focal deficit present.     Mental Status: She is alert.  Psychiatric:        Mood and Affect: Mood normal.        Behavior: Behavior normal.     ED Results / Procedures / Treatments   Labs (all labs ordered are listed, but only abnormal results are displayed) Labs Reviewed  CBC WITH DIFFERENTIAL/PLATELET  COMPREHENSIVE METABOLIC PANEL  URINALYSIS, ROUTINE W REFLEX MICROSCOPIC  LIPASE, BLOOD  BRAIN NATRIURETIC PEPTIDE  CBG MONITORING, ED    EKG None  Radiology DG Chest Portable 1 View  Result Date: 08/05/2023 CLINICAL DATA:  Altered mental status EXAM: PORTABLE CHEST 1 VIEW COMPARISON:  04/04/2023 FINDINGS: Lungs are mildly hyperinflated, stable since prior examination, in keeping with changes of underlying COPD. The lungs are clear. No pneumothorax or pleural effusion. Mild cardiomegaly is stable. Pulmonary vascularity is normal. No acute bone abnormality. IMPRESSION: 1. No active disease. COPD. Mild cardiomegaly. Electronically Signed   By: Helyn Numbers M.D.   On: 08/05/2023 22:36    Procedures Procedures    Medications Ordered in ED Medications - No data to display  ED Course/ Medical Decision Making/ A&P Clinical Course as of 08/05/23 2308  [facility] Aug 05, 2023  2307 Glucose-Capillary(!): 117 [TY]    Clinical Course User Index [TY] Coral Spikes, DO                                 Medical Decision Making This is a 89 year old female presenting emergency department for evaluation as she has been feeling "unwell" with some worsening Alzheimer's/confusion for the past week.  No specific reported symptoms by patient or grandson over the same duration.  She does have a heart rate of 104, but is  nontachycardic hemodynamically stable.  Physical exam with no obvious signs of trauma, no obvious signs of infection.  No localizing neurodeficits.  Will get broad lab workup and re-assess. Care signed out to overnight team.   Amount and/or Complexity of Data Reviewed External Data Reviewed: notes.    Details: Patient does have cognitive decline and is in process with outpatient neurology Labs: ordered. Decision-making details documented in ED Course. Radiology: ordered.    Details: Considered CT of the head given complaint of altered mental status, however patient with no localizing neurodeficits on exam.    ECG/medicine tests: ordered.  Risk Decision regarding hospitalization.         Final Clinical Impression(s) / ED Diagnoses Final diagnoses:  None    Rx / DC Orders ED Discharge Orders     None         Coral Spikes, DO 08/05/23 2244

## 2023-08-06 ENCOUNTER — Ambulatory Visit: Payer: Medicare Other | Admitting: Neurology

## 2023-08-06 ENCOUNTER — Encounter: Payer: Self-pay | Admitting: Neurology

## 2023-08-06 VITALS — BP 133/83 | HR 79 | Ht 66.0 in | Wt 114.5 lb

## 2023-08-06 DIAGNOSIS — F03B2 Unspecified dementia, moderate, with psychotic disturbance: Secondary | ICD-10-CM | POA: Diagnosis not present

## 2023-08-06 LAB — LIPASE, BLOOD: Lipase: 33 U/L (ref 11–51)

## 2023-08-06 LAB — COMPREHENSIVE METABOLIC PANEL
ALT: 13 U/L (ref 0–44)
AST: 17 U/L (ref 15–41)
Albumin: 3.9 g/dL (ref 3.5–5.0)
Alkaline Phosphatase: 52 U/L (ref 38–126)
Anion gap: 11 (ref 5–15)
BUN: 24 mg/dL — ABNORMAL HIGH (ref 8–23)
CO2: 24 mmol/L (ref 22–32)
Calcium: 9.4 mg/dL (ref 8.9–10.3)
Chloride: 104 mmol/L (ref 98–111)
Creatinine, Ser: 0.99 mg/dL (ref 0.44–1.00)
GFR, Estimated: 53 mL/min — ABNORMAL LOW (ref >60)
Glucose, Bld: 136 mg/dL — ABNORMAL HIGH (ref 70–99)
Potassium: 3.8 mmol/L (ref 3.5–5.1)
Sodium: 139 mmol/L (ref 135–145)
Total Bilirubin: 0.4 mg/dL (ref 0.3–1.2)
Total Protein: 7 g/dL (ref 6.5–8.1)

## 2023-08-06 MED ORDER — QUETIAPINE FUMARATE 25 MG PO TABS: 25.0000 mg | ORAL_TABLET | Freq: Every evening | ORAL | 5 refills | Status: DC | PRN

## 2023-08-06 NOTE — Progress Notes (Signed)
Chief Complaint  Patient presents with   Memory Loss    Rm15, daughter present  Pt was in [place] hospital last night released 315am today  Memory loss:mmse was 13      ASSESSMENT AND PLAN  Dana Burns is a 89 y.o. female   Dementia with hallucinations  Seroquel 25 mg half to 1 tablet as needed at nighttime  CT scan of brain showed no acute abnormality  Most consistent with central nervous system degenerative disorder, B12 deficiency likely contributed to   B12 deficiency  Continue supplement,   B12 level was 175   Continue follow-up with her primary care physician  DIAGNOSTIC DATA (LABS, IMAGING, TESTING) - I reviewed patient records, labs, notes, testing and imaging myself where available.   MEDICAL HISTORY:  Dana Burns is a 89 year old female accompanied by daughter, seen in request by her primary care physician Dr. Danise Edge for evaluation of memory loss, visual hallucinations, initial evaluation August 06, 2023   I reviewed and summarized the referring note. PMHX. New onset A fib in Jan 2024 Colon Cancer, s/p colectomy, chemo, radiation Peripheral neuropathy.  Patient is a retired Chartered certified accountant, lives with her daughter for many years, had a gradual onset of memory loss over the past few years, getting worse since her hospital admission in January 2024 for acute hypoxic respiratory failure secondary to RSV bronchitis, new onset atrial fibrillation, was started on Eliquis treatment  She had multiple ED presentations since then, for different reasons, weakness, worsening edema, frequent falls, congestive heart failure,  She began to ambulate with walker, daughter also reported that she become more sedentary, lack of appetite, worsening memory loss, sometimes confused her daughter with her other relatives, also developed visual hallucinations, to the point that caused argument between patient and her daughter  She was taken to emergency room again in August 05, 2023 for confusion, visual hallucinations Personally reviewed CT head without contrast, no acute abnormality, generalized atrophy, periventricular small vessel disease  Laboratory evaluation showed elevated BNP 273, which improved from previous 516  No significant abnormality on CMP, CBC, no evidence of active UTI  Patient's daughter hope to have some medications to calm patient when she becomes agitated at nighttime and have hallucinations that hard to be reasoned.  Mini-Mental status examination 13/30 today   PHYSICAL EXAM:   Vitals:   08/06/23 1511 08/06/23 1520  BP: (!) 142/93 133/83  Pulse: 79   Weight: 114 lb 8 oz (51.9 kg)   Height: 5\' 6"  (1.676 m)       Body mass index is 18.48 kg/m.  PHYSICAL EXAMNIATION:  Gen: NAD, conversant, well nourised, well groomed                     Cardiovascular: Regular rate rhythm, no peripheral edema, warm, nontender. Eyes: Conjunctivae clear without exudates or hemorrhage Neck: Supple, no carotid bruits. Pulmonary: Clear to auscultation bilaterally   NEUROLOGICAL EXAM:  MENTAL STATUS: Speech/cognition: Awake, alert, oriented to history taking and casual conversation    08/06/2023    3:13 PM 03/26/2023    4:06 PM 05/14/2017    2:33 PM  MMSE - Mini Mental State Exam  Not completed:  Unable to complete   Orientation to time 3  5  Orientation to Place 3  5  Registration 3  3  Attention/ Calculation 0  5  Recall 0  2  Language- name 2 objects 2  2  Language- repeat 0  1  Language- follow 3 step command 1  3  Language- read & follow direction 1  1  Write a sentence 0  1  Copy design 0  1  Total score 13  29    CN II: Visual fields are full to confrontation. Pupils are round equal and briskly reactive to light. CN III, IV, VI: extraocular movement are normal. No ptosis. CN V: Facial sensation is intact to light touch CN VII: Face is symmetric with normal eye closure  CN VIII: Hearing is normal to causal conversation. CN  IX, X: Phonation is normal. CN XI: Head turning and shoulder shrug are intact  MOTOR: There is no pronator drift of out-stretched arms. Muscle bulk and tone are normal. Muscle strength is normal.  Mild bilateral lower extremity pitting edema  REFLEXES: Reflexes are hypoactive and symmetric  SENSORY: Length-dependent decreased light touch, vibratory sensation to midshin level  COORDINATION: There is no trunk or limb dysmetria noted.  GAIT/STANCE: Rely on her walker to get up from seated position, cautious  REVIEW OF SYSTEMS:  Full 14 system review of systems performed and notable only for as above All other review of systems were negative.   ALLERGIES: Allergies  Allergen Reactions   Tape Itching and Rash   Actos [Pioglitazone Hydrochloride] Other (See Comments)    Unknown reaction   Buspar [Buspirone Hcl] Other (See Comments)    Unknown reaction   Doxycycline Other (See Comments)    Made the patient "feel funny"- in a negative way   Lipitor [Atorvastatin Calcium] Other (See Comments)    "It made me hurt all over."   Penicillins Rash    HOME MEDICATIONS: Current Outpatient Medications  Medication Sig Dispense Refill   acetaminophen (TYLENOL) 500 MG tablet Take 2 tablets (1,000 mg total) by mouth every 8 (eight) hours. 30 tablet 0   apixaban (ELIQUIS) 2.5 MG TABS tablet Take 1 tablet (2.5 mg total) by mouth 2 (two) times daily. 60 tablet 3   Cholecalciferol (VITAMIN D3) 50 MCG (2000 UT) capsule Take 2,000 Units by mouth daily.      diazepam (VALIUM) 5 MG tablet TAKE 1/2 TO 1 TABLET DAILY AS NEEDED FOR ANXIETY 30 tablet 1   furosemide (LASIX) 20 MG tablet Take 1 tablet (20 mg total) by mouth daily. Take for 3 days, then switch to as needed use for swelling associated with weight gain of 3 pounds in 24 hours or 6 pounds in a week. Let us know if you are having to use frequently so we can monitor kidney function. (Patient taking differently: Take 20 mg by mouth daily as needed.)  30 tablet 0   gabapentin (NEURONTIN) 800 MG tablet Take 1 tablet (800 mg total) by mouth 3 (three) times daily. (Patient taking differently: Take 800 mg by mouth. 2 or 3 times a day) 270 tablet 1   metoprolol tartrate (LOPRESSOR) 25 MG tablet Take 1 tablet twice daily. Please hold medication on the days systolic blood pressure (top number) is less than 90. 180 tablet 3   mometasone-formoterol (DULERA) 200-5 MCG/ACT AERO Inhale 2 puffs into the lungs 2 (two) times daily. (Patient taking differently: Inhale 2 puffs into the lungs as needed for wheezing or shortness of breath.) 1 each 2   rOPINIRole (REQUIP) 1 MG tablet TAKE 1 TABLET BY MOUTH AT BEDTIME. 90 tablet 1   tiZANidine (ZANAFLEX) 4 MG tablet Take 1 tablet (4 mg total) by mouth 2 (two) times daily as needed for muscle spasms. (Patient taking differently:  Take 4 mg by mouth at bedtime.) 180 tablet 1   venlafaxine XR (EFFEXOR-XR) 37.5 MG 24 hr capsule TAKE 1 CAPSULE BY MOUTH DAILY WITH BREAKFAST. 90 capsule 1   Wound Dressings (ALLANTOIN) gel Apply topically as needed for wound care. 15 g 0   No current facility-administered medications for this visit.    PAST MEDICAL HISTORY: Past Medical History:  Diagnosis Date   Abdominal pain 10/19/2017   Allergy    sneeze, rhinorrhea in am   Anxiety state 09/03/2013   Breast mass, right 10/14/2012   Chicken pox as a child   Chronic pain syndrome 01/23/2015   Chronic renal insufficiency 03/17/2011   Colon cancer (HCC)    Complication of anesthesia    agitated when waking up, wakes up shaking, "like  I'm going into shock"   Constipation 10/19/2017   Depression    husband died  3 months ago, pt. admits depression  currently    Diabetic neuropathy (HCC) 03/17/2011   DM (diabetes mellitus) type 2, uncontrolled, with ketoacidosis (HCC) 03/17/2011   Ductal hyperplasia of breast 03/17/2011   Essential hypertension, benign 05/31/2010   Qualifier: Diagnosis of  By: Antoine Poche, MD, Alinda Dooms  07/15/2021   Fibromyalgia    GERD (gastroesophageal reflux disease)    H/O echocardiogram    done /w [place] in 2011, saw Dr. Antoine Poche for tachycardia    Headache    History of colon cancer 03/17/2011   Hyperlipidemia    6 years   Hyperlipidemia, mixed    6 years   Hypertension    17 years, reports she has a rapid heartrate    Loss of weight 12/14/2013   Measles as a child   Osteoarthritis (arthritis due to wear and tear of joints) 03/17/2011   Noted diffusely including neck   Osteopenia 12/14/2013   Pain in finger of right hand 05/15/2017   Pain in joint, lower leg 09/03/2013   Pain of right shoulder region 07/07/2014   Peripheral neuropathy    SOB (shortness of breath) 05/31/2010   Qualifier: Diagnosis of  By: Antoine Poche, MD, Gerrit Heck     Stress 06/01/2013   Tachycardia 05/31/2010   Qualifier: Diagnosis of  By: Antoine Poche, MD, Gerrit Heck     Type 2 diabetes mellitus with peripheral neuropathy (HCC) 03/17/2011   Vitamin B 12 deficiency 07/07/2014   intrinsic factor positive    PAST SURGICAL HISTORY: Past Surgical History:  Procedure Laterality Date   BREAST EXCISIONAL BIOPSY Right 2013   benign   BREAST SURGERY  2013   right- benign   CATARACT EXTRACTION     /w IOL- bilateral    COLON SURGERY     For resection of cancer   Nodule removed     From throat    FAMILY HISTORY: Family History  Problem Relation Age of Onset   Cancer Mother        Brain   Cancer Father        Colorectal   Cancer Sister        breast   Other Brother        pacemaker   Cancer Brother        pancreatic cancer   Arthritis Sister    Emphysema Brother    Cancer Brother    COPD Brother    Neuropathy Daughter    Fibromyalgia Daughter    Cancer Brother        metastatic colon cancer   Heart disease  Neg Hx        Early    SOCIAL HISTORY: Social History   Socioeconomic History   Marital status: Widowed    Spouse name: Not on file   Number of children: 1   Years of education: 9    Highest education level: Not on file  Occupational History   Occupation: Retired  Tobacco Use   Smoking status: Never   Smokeless tobacco: Never  Substance and Sexual Activity   Alcohol use: No   Drug use: No   Sexual activity: Never    Comment: lives with daughter. no dietary restrictions  Other Topics Concern   Not on file  Social History Narrative   Patient lives at home with daughter.    Patient is retired.    Patient is widowed.    Patient has 1 child.    Patient has a 9th grade education.    Social Determinants of Health   Financial Resource Strain: Low Risk  (03/01/2022)   Overall Financial Resource Strain (CARDIA)    Difficulty of Paying Living Expenses: Not hard at all  Food Insecurity: No Food Insecurity (04/18/2023)   Hunger Vital Sign    Worried About Running Out of Food in the Last Year: Never true    Ran Out of Food in the Last Year: Never true  Transportation Needs: No Transportation Needs (04/09/2023)   PRAPARE - Administrator, Civil Service (Medical): No    Lack of Transportation (Non-Medical): No  Physical Activity: Inactive (03/01/2022)   Exercise Vital Sign    Days of Exercise per Week: 0 days    Minutes of Exercise per Session: 0 min  Stress: No Stress Concern Present (03/01/2022)   [employer] of Occupational Health - Occupational Stress Questionnaire    Feeling of Stress : Not at all  Social Connections: Socially Isolated (03/26/2023)   Social Connection and Isolation Panel [NHANES]    Frequency of Communication with Friends and Family: More than three times a week    Frequency of Social Gatherings with Friends and Family: More than three times a week    Attends Religious Services: Never    Database administrator or Organizations: No    Attends Banker Meetings: Never    Marital Status: Widowed  Intimate Partner Violence: Not At Risk (03/28/2023)   Humiliation, Afraid, Rape, and Kick questionnaire    Fear of Current or  Ex-Partner: No    Emotionally Abused: No    Physically Abused: No    Sexually Abused: No      Levert Feinstein, M.D. Ph.D.  [facility] [address], [address] [place], [place] [address] Ph: [phone] Fax: [phone]  CC:  Bradd Canary, MD [address] [place],  [place] [address]  Bradd Canary, MD

## 2023-08-08 ENCOUNTER — Encounter: Payer: Self-pay | Admitting: *Deleted

## 2023-08-08 ENCOUNTER — Telehealth: Payer: Self-pay | Admitting: *Deleted

## 2023-08-08 NOTE — Transitions of Care (Post Inpatient/ED Visit) (Signed)
   08/08/2023  Name: MISAKI BURROWES MRN: [id number] DOB: 1926/03/06  Today's TOC FU Call Status: Today's TOC FU Call Status:: Unsuccessful Call (1st Attempt) Unsuccessful Call (1st Attempt) Date: 08/08/23  ED EMMI Red Alert notification on 08/08/23 from ED visit 08/06/23- automated EMMI call placed 08/07/23: "No scheduled follow up"  Attempted to reach the patient regarding the most recent ED visit; Phone rang without physical or voice mai pick up; unable to leave voice message requesting call back   Follow Up Plan: Additional outreach attempts will be made to reach the patient to complete the Transitions of Care (Post ED visit) call.   Caryl Pina, RN, BSN, CCRN Alumnus RN CM Care Coordination/ Transition of Care- [facility] Care Management [phone]: direct office

## 2023-08-09 ENCOUNTER — Telehealth: Payer: Self-pay | Admitting: *Deleted

## 2023-08-09 ENCOUNTER — Encounter: Payer: Self-pay | Admitting: *Deleted

## 2023-08-09 NOTE — Transitions of Care (Post Inpatient/ED Visit) (Signed)
08/09/2023  Name: Dana Burns MRN: [id number] DOB: 11-03-1926  Today's TOC FU Call Status: Today's TOC FU Call Status:: Successful TOC FU Call Completed TOC FU Call Complete Date: 08/09/23  ED EMMI Red Alert notification on 08/08/23 from ED visit 08/06/23- automated EMMI call placed 08/07/23: "No scheduled follow up"   Transition Care Management Follow-up Telephone Call Date of Discharge: 08/06/23 Discharge Facility: [facility]) Type of Discharge: Emergency Department Reason for ED Visit: Other: (malaise; AF) How have you been since you were released from the hospital?: Better (per caregiver/ daughter: "she is doing okay, about the same as always, no real changes.  The neurologist put her on seroquel and I have given her one dose.  I will call Dr. Abner Greenspan if we need anything") Any questions or concerns?: No  Items Reviewed: Did you receive and understand the discharge instructions provided?: Yes (thoroughly reviewed with patient's caregiver who verbalizes good understanding of same) Medications obtained,verified, and reconciled?: Yes (Medications Reviewed) (Full medication reconciliation/ review completed; no concerns or discrepancies identified; caregiver-manages medications and denies questions/ concerns around medications today) Any new allergies since your discharge?: No Dietary orders reviewed?: Yes Type of Diet Ordered:: per caregiver: "whatever she wants-- her appetite is very poor" Do you have support at home?: Yes People in Home: child(ren), adult Name of Support/Comfort Primary Source: Reports essentially independent in most self-care activities; resides with supprtive daughter who assists as/ if needed/ indicated  Medications Reviewed Today: Medications Reviewed Today     Reviewed by Michaela Corner, RN (Registered Nurse) on 08/09/23 at 1153  Med List Status: <None>   Medication Order Taking? Sig Documenting Provider Last Dose Status Informant  acetaminophen (TYLENOL) 500  MG tablet [id number] Yes Take 2 tablets (1,000 mg total) by mouth every 8 (eight) hours. Barnetta Chapel, MD Taking Active   apixaban Everlene Balls) 2.5 MG TABS tablet [id number] Yes Take 1 tablet (2.5 mg total) by mouth 2 (two) times daily. Joylene Grapes, NP Taking Active   Cholecalciferol (VITAMIN D3) 50 MCG (2000 UT) capsule [id number] Yes Take 2,000 Units by mouth daily.  [provider] Taking Active Child, Pharmacy Records  diazepam (VALIUM) 5 MG tablet [id number] Yes TAKE 1/2 TO 1 TABLET DAILY AS NEEDED FOR ANXIETY Bradd Canary, MD Taking Active   furosemide (LASIX) 20 MG tablet [id number] Yes Take 1 tablet (20 mg total) by mouth daily. Take for 3 days, then switch to as needed use for swelling associated with weight gain of 3 pounds in 24 hours or 6 pounds in a week. Let us know if you are having to use frequently so we can monitor kidney function.  Patient taking differently: Take 20 mg by mouth daily as needed.   Barnetta Chapel, MD Taking Active            Med Note Michaela Corner   Fri Aug 09, 2023 11:43 AM) 08/09/23: Reports during TOC call has not needed recently   gabapentin (NEURONTIN) 800 MG tablet [id number] Yes Take 1 tablet (800 mg total) by mouth 3 (three) times daily.  Patient taking differently: Take 800 mg by mouth. 2 or 3 times a day   Bradd Canary, MD Taking Active Child, Pharmacy Records           Med Note [place], [place] B   Wed May 15, 2023  1:25 PM)    metoprolol tartrate (LOPRESSOR) 25 MG tablet [id number] Yes Take 1 tablet twice daily. Please hold medication on  the days systolic blood pressure (top number) is less than 90. Joylene Grapes, NP Taking Active Child, Pharmacy Records  mometasone-formoterol [facility]) 200-5 MCG/ACT Sandrea Matte [id number] Yes Inhale 2 puffs into the lungs 2 (two) times daily.  Patient taking differently: Inhale 2 puffs into the lungs as needed for wheezing or shortness of breath.   Kathlen Mody, MD Taking Active Child, Pharmacy Records            Med Note Michaela Corner   Fri Aug 09, 2023 11:44 AM) 08/09/23: Reports during [facility] call has not needed recently  QUEtiapine (SEROQUEL) 25 MG tablet [id number] Yes Take 1 tablet (25 mg total) by mouth at bedtime as needed. Levert Feinstein, MD Taking Active            Med Note Michaela Corner   Fri Aug 09, 2023 11:45 AM) 08/09/23: Reports during [facility] call was added by neurology provider on 08/06/23  rOPINIRole (REQUIP) 1 MG tablet [id number] Yes TAKE 1 TABLET BY MOUTH AT BEDTIME. Bradd Canary, MD Taking Active Child, Pharmacy Records  tiZANidine (ZANAFLEX) 4 MG tablet [id number] Yes Take 1 tablet (4 mg total) by mouth 2 (two) times daily as needed for muscle spasms.  Patient taking differently: Take 4 mg by mouth at bedtime.   Bradd Canary, MD Taking Active Child, Pharmacy Records  venlafaxine XR (EFFEXOR-XR) 37.5 MG 24 hr capsule [id number] Yes TAKE 1 CAPSULE BY MOUTH DAILY WITH BREAKFAST. Bradd Canary, MD Taking Active Child, Pharmacy Records  Wound Dressings (ALLANTOIN) gel [id number] No Apply topically as needed for wound care.  Patient not taking: Reported on 08/09/2023   Bradd Canary, MD Not Taking Active            Med Note Michaela Corner   Fri Aug 09, 2023 11:47 AM) 08/09/23: Reports during [facility] call she never picked up from pharmacy due to it not being available at outpatient pharmacy           Home Care and Equipment/Supplies: Were Home Health Services Ordered?: No Any new equipment or medical supplies ordered?: No  Functional Questionnaire: Do you need assistance with bathing/showering or dressing?: Yes (daughter assists/ supervises as needed/ indicated) Do you need assistance with meal preparation?: Yes (daughter assists/ supervises as needed/ indicated) Do you need assistance with eating?: No Do you have difficulty maintaining continence: No Do you need assistance with getting out of bed/getting out of a chair/moving?: No (daughter assists/ supervises as needed/ indicated) Do  you have difficulty managing or taking your medications?: Yes (daughter manages all aspects of medication administration)  Follow up appointments reviewed: PCP Follow-up appointment confirmed?: NA (verified not indicated per hospital discharging provider discharge notes -- daughter confirms she has number for PCP and will call as needed for any needs/ concerns: verifies attended specialists appointment post-ED visit on 08/06/23) Specialist Hospital Follow-up appointment confirmed?: Yes Date of Specialist follow-up appointment?: 08/06/23 Follow-Up Specialty Provider:: neurology provider- verified attended as scheduled Do you need transportation to your follow-up appointment?: No Do you understand care options if your condition(s) worsen?: Yes-patient verbalized understanding  SDOH Interventions Today    Flowsheet Row Most Recent Value  SDOH Interventions   Food Insecurity Interventions Intervention Not Indicated  Transportation Interventions Intervention Not Indicated  [daughter provides all transportation]      TOC Interventions Today    Flowsheet Row Most Recent Value  TOC Interventions   TOC Interventions Discussed/Reviewed TOC Interventions Discussed  [caregiver declines need for ongoing/ further care  coordination outreach,  no care coordination needs identified at time of TOC call today]      Interventions Today    Flowsheet Row Most Recent Value  Chronic Disease   Chronic disease during today's visit Atrial Fibrillation (AFib), Other  [malaise, permanent AF,  weakness dementia]  General Interventions   General Interventions Discussed/Reviewed General Interventions Discussed, Durable Medical Equipment (DME), Doctor Visits  Doctor Visits Discussed/Reviewed Doctor Visits Reviewed, Doctor Visits Discussed, PCP, Specialist  Durable Medical Equipment (DME) Dan Humphreys, Bed side commode  [confirmed currently requiring/ using assistive devices - walker and cane]  PCP/Specialist Visits  Compliance with follow-up visit  Nutrition Interventions   Nutrition Discussed/Reviewed Nutrition Discussed  Pharmacy Interventions   Pharmacy Dicussed/Reviewed Pharmacy Topics Discussed  [Full medication review with updating medication list in EHR per patient report]  Safety Interventions   Safety Discussed/Reviewed Safety Discussed, Fall Risk      Caryl Pina, RN, BSN, CCRN Alumnus RN CM Care Coordination/ Transition of Care- [facility] Care Management [phone]: direct office

## 2023-08-15 ENCOUNTER — Ambulatory Visit: Payer: Medicare Other | Admitting: Physician Assistant

## 2023-08-15 VITALS — BP 148/82 | HR 74 | Temp 97.6°F | Resp 18 | Ht 66.0 in | Wt 120.0 lb

## 2023-08-15 DIAGNOSIS — R600 Localized edema: Secondary | ICD-10-CM | POA: Diagnosis not present

## 2023-08-15 NOTE — Progress Notes (Signed)
Requested by:  Clayborne Dana, NP [address] [place],  [place] [address]  Reason for consultation: bilateral lower extremity edema    History of Present Illness   Dana Burns is a 89 y.o. (December 07, 1926) female who presents for evaluation of bilateral lower extremity edema.  She states she has had bilateral leg and foot swelling for at least 3 years.  This has worsened over time and is worse in the right leg.  Her swelling extends from the mid calf to the dorsal portion of her foot.  Her swelling causes her legs to feel tight and heavy.  She notes aggravating factors including prolonged sitting, standing, or when it is hot outside.  She has tried compression stockings but cannot get them on.  She elevates her legs a little bit throughout the day in her recliner, and experiences some benefit from that.  She has no prior history of vein procedures or history of DVT.  Past Medical History:  Diagnosis Date   Abdominal pain 2017-10-15   Allergy    sneeze, rhinorrhea in am   Anxiety state 09/03/2013   Breast mass, right 10/14/2012   Chicken pox as a child   Chronic pain syndrome 01/23/2015   Chronic renal insufficiency 03/17/2011   Colon cancer (HCC)    Complication of anesthesia    agitated when waking up, wakes up shaking, "like  I'm going into shock"   Constipation 2017/10/15   Depression    husband died  3 months ago, pt. admits depression  currently    Diabetic neuropathy (HCC) 03/17/2011   DM (diabetes mellitus) type 2, uncontrolled, with ketoacidosis (HCC) 03/17/2011   Ductal hyperplasia of breast 03/17/2011   Essential hypertension, benign 05/31/2010   Qualifier: Diagnosis of  By: Antoine Poche, MD, Alinda Dooms 07/15/2021   Fibromyalgia    GERD (gastroesophageal reflux disease)    H/O echocardiogram    done /w [place] in 2011, saw Dr. Antoine Poche for tachycardia    Headache    History of colon cancer 03/17/2011   Hyperlipidemia    6 years   Hyperlipidemia,  mixed    6 years   Hypertension    15 years, reports she has a rapid heartrate    Loss of weight 12/14/2013   Measles as a child   Osteoarthritis (arthritis due to wear and tear of joints) 03/17/2011   Noted diffusely including neck   Osteopenia 12/14/2013   Pain in finger of right hand 05/15/2017   Pain in joint, lower leg 09/03/2013   Pain of right shoulder region 07/07/2014   Peripheral neuropathy    SOB (shortness of breath) 05/31/2010   Qualifier: Diagnosis of  By: Antoine Poche, MD, Gerrit Heck     Stress 06/01/2013   Tachycardia 05/31/2010   Qualifier: Diagnosis of  By: Antoine Poche, MD, Gerrit Heck     Type 2 diabetes mellitus with peripheral neuropathy (HCC) 03/17/2011   Vitamin B 12 deficiency 07/07/2014   intrinsic factor positive    Past Surgical History:  Procedure Laterality Date   BREAST EXCISIONAL BIOPSY Right 2013   benign   BREAST SURGERY  2013   right- benign   CATARACT EXTRACTION     /w IOL- bilateral    COLON SURGERY     For resection of cancer   Nodule removed     From throat    Social History   Socioeconomic History   Marital status: Widowed  Spouse name: Not on file   Number of children: 1   Years of education: 9   Highest education level: Not on file  Occupational History   Occupation: Retired  Tobacco Use   Smoking status: Never   Smokeless tobacco: Never  Substance and Sexual Activity   Alcohol use: No   Drug use: No   Sexual activity: Never    Comment: lives with daughter. no dietary restrictions  Other Topics Concern   Not on file  Social History Narrative   Patient lives at home with daughter.    Patient is retired.    Patient is widowed.    Patient has 1 child.    Patient has a 9th grade education.    Social Determinants of Health   Financial Resource Strain: Low Risk  (03/01/2022)   Overall Financial Resource Strain (CARDIA)    Difficulty of Paying Living Expenses: Not hard at all  Food Insecurity: No Food Insecurity (08/09/2023)    Hunger Vital Sign    Worried About Running Out of Food in the Last Year: Never true    Ran Out of Food in the Last Year: Never true  Transportation Needs: No Transportation Needs (08/09/2023)   PRAPARE - Administrator, Civil Service (Medical): No    Lack of Transportation (Non-Medical): No  Physical Activity: Inactive (03/01/2022)   Exercise Vital Sign    Days of Exercise per Week: 0 days    Minutes of Exercise per Session: 0 min  Stress: No Stress Concern Present (03/01/2022)   [employer] of Occupational Health - Occupational Stress Questionnaire    Feeling of Stress : Not at all  Social Connections: Socially Isolated (03/26/2023)   Social Connection and Isolation Panel [NHANES]    Frequency of Communication with Friends and Family: More than three times a week    Frequency of Social Gatherings with Friends and Family: More than three times a week    Attends Religious Services: Never    Database administrator or Organizations: No    Attends Banker Meetings: Never    Marital Status: Widowed  Intimate Partner Violence: Not At Risk (03/28/2023)   Humiliation, Afraid, Rape, and Kick questionnaire    Fear of Current or Ex-Partner: No    Emotionally Abused: No    Physically Abused: No    Sexually Abused: No    Family History  Problem Relation Age of Onset   Cancer Mother        Brain   Cancer Father        Colorectal   Cancer Sister        breast   Other Brother        pacemaker   Cancer Brother        pancreatic cancer   Arthritis Sister    Emphysema Brother    Cancer Brother    COPD Brother    Neuropathy Daughter    Fibromyalgia Daughter    Cancer Brother        metastatic colon cancer   Heart disease Neg Hx        Early    Current Outpatient Medications  Medication Sig Dispense Refill   acetaminophen (TYLENOL) 500 MG tablet Take 2 tablets (1,000 mg total) by mouth every 8 (eight) hours. 30 tablet 0   apixaban (ELIQUIS) 2.5 MG TABS tablet  Take 1 tablet (2.5 mg total) by mouth 2 (two) times daily. 60 tablet 3   Cholecalciferol (VITAMIN D3)  50 MCG (2000 UT) capsule Take 2,000 Units by mouth daily.      diazepam (VALIUM) 5 MG tablet TAKE 1/2 TO 1 TABLET DAILY AS NEEDED FOR ANXIETY 30 tablet 1   furosemide (LASIX) 20 MG tablet Take 1 tablet (20 mg total) by mouth daily. Take for 3 days, then switch to as needed use for swelling associated with weight gain of 3 pounds in 24 hours or 6 pounds in a week. Let us know if you are having to use frequently so we can monitor kidney function. (Patient taking differently: Take 20 mg by mouth daily as needed.) 30 tablet 0   gabapentin (NEURONTIN) 800 MG tablet Take 1 tablet (800 mg total) by mouth 3 (three) times daily. (Patient taking differently: Take 800 mg by mouth. 2 or 3 times a day) 270 tablet 1   metoprolol tartrate (LOPRESSOR) 25 MG tablet Take 1 tablet twice daily. Please hold medication on the days systolic blood pressure (top number) is less than 90. 180 tablet 3   mometasone-formoterol (DULERA) 200-5 MCG/ACT AERO Inhale 2 puffs into the lungs 2 (two) times daily. (Patient taking differently: Inhale 2 puffs into the lungs as needed for wheezing or shortness of breath.) 1 each 2   QUEtiapine (SEROQUEL) 25 MG tablet Take 1 tablet (25 mg total) by mouth at bedtime as needed. 30 tablet 5   rOPINIRole (REQUIP) 1 MG tablet TAKE 1 TABLET BY MOUTH AT BEDTIME. 90 tablet 1   tiZANidine (ZANAFLEX) 4 MG tablet Take 1 tablet (4 mg total) by mouth 2 (two) times daily as needed for muscle spasms. (Patient taking differently: Take 4 mg by mouth at bedtime.) 180 tablet 1   venlafaxine XR (EFFEXOR-XR) 37.5 MG 24 hr capsule TAKE 1 CAPSULE BY MOUTH DAILY WITH BREAKFAST. 90 capsule 1   Wound Dressings (ALLANTOIN) gel Apply topically as needed for wound care. 15 g 0   No current facility-administered medications for this visit.    Allergies  Allergen Reactions   Tape Itching and Rash   Actos [Pioglitazone  Hydrochloride] Other (See Comments)    Unknown reaction   Buspar [Buspirone Hcl] Other (See Comments)    Unknown reaction   Doxycycline Other (See Comments)    Made the patient "feel funny"- in a negative way   Lipitor [Atorvastatin Calcium] Other (See Comments)    "It made me hurt all over."   Penicillins Rash    REVIEW OF SYSTEMS (negative unless checked):   Cardiac:  []  Chest pain or chest pressure? []  Shortness of breath upon activity? []  Shortness of breath when lying flat? []  Irregular heart rhythm?  Vascular:  []  Pain in calf, thigh, or hip brought on by walking? []  Pain in feet at night that wakes you up from your sleep? []  Blood clot in your veins? [x]  Leg swelling?  Pulmonary:  []  Oxygen at home? []  Productive cough? []  Wheezing?  Neurologic:  []  Sudden weakness in arms or legs? []  Sudden numbness in arms or legs? []  Sudden onset of difficult speaking or slurred speech? []  Temporary loss of vision in one eye? []  Problems with dizziness?  Gastrointestinal:  []  Blood in stool? []  Vomited blood?  Genitourinary:  []  Burning when urinating? []  Blood in urine?  Psychiatric:  []  Major depression  Hematologic:  []  Bleeding problems? []  Problems with blood clotting?  Dermatologic:  []  Rashes or ulcers?  Constitutional:  []  Fever or chills?  Ear/Nose/Throat:  []  Change in hearing? []  Nose bleeds? []  Sore throat?  Musculoskeletal:  []  Back pain? []  Joint pain? []  Muscle pain?   Physical Examination     Vitals:   08/15/23 1305  BP: (!) 148/82  Pulse: 74  Resp: 18  Temp: 97.6 F (36.4 C)  TempSrc: Temporal  SpO2: 97%  Weight: 120 lb (54.4 kg)  Height: 5\' 6"  (1.676 m)   Body mass index is 19.37 kg/m.  General:  WDWN in NAD; vital signs documented above Gait: Not observed HENT: WNL, normocephalic Pulmonary: normal non-labored breathing , without Rales, rhonchi,  wheezing Cardiac: regular Abdomen: soft, NT, no masses Skin: without  rashes Vascular Exam/Pulses: palpable DP pulses Extremities: without varicose veins, with reticular veins, with 1+ edema around bilateral ankles and dorsal foot, with stasis pigmentation, without lipodermatosclerosis, without ulcers Musculoskeletal: no muscle wasting or atrophy  Neurologic: A&O X 3;  No focal weakness or paresthesias are detected Psychiatric:  The pt has Normal affect.  Non-invasive Vascular Imaging   RLE Venous Insufficiency Duplex (06/18/2023):  +--------------+---------+------+-----------+------------+--------+  RIGHT        Reflux NoRefluxReflux TimeDiameter cmsComments                          Yes                                   +--------------+---------+------+-----------+------------+--------+  CFV          no                                              +--------------+---------+------+-----------+------------+--------+  FV mid        no                                              +--------------+---------+------+-----------+------------+--------+  Popliteal    no                                              +--------------+---------+------+-----------+------------+--------+  GSV at [facility]    no                            0.41              +--------------+---------+------+-----------+------------+--------+  GSV prox thighno                            0.27              +--------------+---------+------+-----------+------------+--------+  GSV mid thigh no                            0.3               +--------------+---------+------+-----------+------------+--------+  GSV dist thighno                            0.28              +--------------+---------+------+-----------+------------+--------+  GSV at knee   no                            0.26              +--------------+---------+------+-----------+------------+--------+  GSV prox calf no                            0.26               +--------------+---------+------+-----------+------------+--------+  SSV Pop Fossa                                       NWV       +--------------+---------+------+-----------+------------+--------+  SSV prox calf                                       NWV       +--------------+---------+------+-----------+------------+--------+     Medical Decision Making   Dana Burns is a 89 y.o. female who presents bilateral lower extremity edema  Based on the patient's duplex, there is no reflux in the right lower extremity.  There is no evidence of chronic or acute DVT.  She would not be a candidate for vein ablation The patient has a 3+ year history of bilateral lower extremity edema, extending from the mid calf to the dorsal foot.  Her swelling is worse in the right leg.  Her swelling causes her legs to feel tight. She has tried multiple pairs of compression stockings but cannot get them on.  She has tried Ace wraps and has experienced benefit from these.  She elevates her legs somewhat in a recliner. The patient does have underlying cardiac and renal issues potentially contributing to her peripheral edema.  I believe she would benefit from continued leg elevation and use of Ace wraps She can follow-up with our office as needed  Ernestene Mention, PA-C [facility] Office: [phone]  08/15/2023, 1:09 PM  Clinic MD: Randie Heinz

## 2023-08-29 ENCOUNTER — Other Ambulatory Visit: Payer: Self-pay | Admitting: Neurology

## 2023-08-30 ENCOUNTER — Other Ambulatory Visit: Payer: Self-pay | Admitting: Family Medicine

## 2023-08-30 DIAGNOSIS — F419 Anxiety disorder, unspecified: Secondary | ICD-10-CM

## 2023-08-30 NOTE — Telephone Encounter (Signed)
Requesting: diazepam 5mg   Contract: 07/01/23 UDS: 07/01/23 Last Visit: 07/01/23 Next Visit:  10/15/23 Last Refill: 05/20/23 #30 and 1RF   Please Advise

## 2023-09-09 ENCOUNTER — Ambulatory Visit: Payer: Medicare Other | Admitting: Nurse Practitioner

## 2023-09-24 ENCOUNTER — Ambulatory Visit: Payer: Medicare Other | Admitting: Nurse Practitioner

## 2023-09-24 NOTE — Progress Notes (Deleted)
Office Visit    Patient Name: Dana Burns Date of Encounter: 09/24/2023  Primary Care Provider:  Bradd Canary, MD Primary Cardiologist:  Garwin Brothers, MD  Chief Complaint    89 year old female with a history of persistent atrial fibrillation, chronic diastolic heart failure, hypertension, hyperlipidemia, chronic renal insufficiency, and type 2 diabetes who presents for follow-up related to heart failure and atrial fibrillation.   Past Medical History    Past Medical History:  Diagnosis Date   Abdominal pain 10-07-2017   Allergy    sneeze, rhinorrhea in am   Anxiety state 09/03/2013   Breast mass, right 10/14/2012   Chicken pox as a child   Chronic pain syndrome 01/23/2015   Chronic renal insufficiency 03/17/2011   Colon cancer (HCC)    Complication of anesthesia    agitated when waking up, wakes up shaking, "like  I'm going into shock"   Constipation 10/07/17   Depression    husband died  3 months ago, pt. admits depression  currently    Diabetic neuropathy (HCC) 03/17/2011   DM (diabetes mellitus) type 2, uncontrolled, with ketoacidosis (HCC) 03/17/2011   Ductal hyperplasia of breast 03/17/2011   Essential hypertension, benign 05/31/2010   Qualifier: Diagnosis of  By: Antoine Poche, MD, Alinda Dooms 07/15/2021   Fibromyalgia    GERD (gastroesophageal reflux disease)    H/O echocardiogram    done /w [facility] in 2011, saw Dr. Antoine Poche for tachycardia    Headache    History of colon cancer 03/17/2011   Hyperlipidemia    6 years   Hyperlipidemia, mixed    6 years   Hypertension    15 years, reports she has a rapid heartrate    Loss of weight 12/14/2013   Measles as a child   Osteoarthritis (arthritis due to wear and tear of joints) 03/17/2011   Noted diffusely including neck   Osteopenia 12/14/2013   Pain in finger of right hand 05/15/2017   Pain in joint, lower leg 09/03/2013   Pain of right shoulder region 07/07/2014   Peripheral neuropathy    SOB (shortness  of breath) 05/31/2010   Qualifier: Diagnosis of  By: Antoine Poche, MD, Gerrit Heck     Stress 06/01/2013   Tachycardia 05/31/2010   Qualifier: Diagnosis of  By: Antoine Poche, MD, Gerrit Heck     Type 2 diabetes mellitus with peripheral neuropathy (HCC) 03/17/2011   Vitamin B 12 deficiency 07/07/2014   intrinsic factor positive   Past Surgical History:  Procedure Laterality Date   BREAST EXCISIONAL BIOPSY Right 2013   benign   BREAST SURGERY  2013   right- benign   CATARACT EXTRACTION     /w IOL- bilateral    COLON SURGERY     For resection of cancer   Nodule removed     From throat    Allergies  Allergies  Allergen Reactions   Tape Itching and Rash   Actos [Pioglitazone Hydrochloride] Other (See Comments)    Unknown reaction   Buspar [Buspirone Hcl] Other (See Comments)    Unknown reaction   Doxycycline Other (See Comments)    Made the patient "feel funny"- in a negative way   Lipitor [Atorvastatin Calcium] Other (See Comments)    "It made me hurt all over."   Penicillins Rash     Labs/Other Studies Reviewed    The following studies were reviewed today:  Cardiac Studies & Procedures       ECHOCARDIOGRAM  ECHOCARDIOGRAM  COMPLETE 01/14/2023  Narrative ECHOCARDIOGRAM REPORT    Patient Name:   CASTELLA POSTLETHWAITE Date of Exam: 01/14/2023 Medical Rec #:  [id number]     Height:       66.0 in Accession #:    [id number]    Weight:       132.0 lb Date of Birth:  03-12-1926     BSA:          1.676 m Patient Age:    89 years      BP:           107/46 mmHg Patient Gender: F             HR:           113 bpm. Exam Location:  Inpatient  Procedure: 2D Echo  Indications:    atrial fibrillation  History:        Patient has prior history of Echocardiogram examinations, most recent 08/03/2021. Arrythmias:Atrial Fibrillation and Tachycardia; Risk Factors:Diabetes and Hypertension.  Sonographer:    Cathie Hoops Referring Phys: Herminio Heads   Sonographer Comments: Technically  difficult study due to poor echo windows and suboptimal subcostal window. IMPRESSIONS   1. Left ventricular ejection fraction, by estimation, is 60 to 65%. The left ventricle has normal function. The left ventricle has no regional wall motion abnormalities. Left ventricular diastolic parameters are indeterminate. 2. Right ventricular systolic function is normal. The right ventricular size is normal. There is normal pulmonary artery systolic pressure. 3. Left atrial size was mildly dilated. 4. Right atrial size was mildly dilated. 5. The mitral valve is normal in structure. No evidence of mitral valve regurgitation. No evidence of mitral stenosis. 6. The aortic valve is tricuspid. There is mild calcification of the aortic valve. There is mild thickening of the aortic valve. Aortic valve regurgitation is trivial. Aortic valve sclerosis is present, with no evidence of aortic valve stenosis. 7. The inferior vena cava is normal in size with greater than 50% respiratory variability, suggesting right atrial pressure of 3 mmHg.  FINDINGS Left Ventricle: Left ventricular ejection fraction, by estimation, is 60 to 65%. The left ventricle has normal function. The left ventricle has no regional wall motion abnormalities. The left ventricular internal cavity size was normal in size. There is no left ventricular hypertrophy. Left ventricular diastolic parameters are indeterminate.  Right Ventricle: The right ventricular size is normal. No increase in right ventricular wall thickness. Right ventricular systolic function is normal. There is normal pulmonary artery systolic pressure. The tricuspid regurgitant velocity is 2.54 m/s, and with an assumed right atrial pressure of 3 mmHg, the estimated right ventricular systolic pressure is 28.8 mmHg.  Left Atrium: Left atrial size was mildly dilated.  Right Atrium: Right atrial size was mildly dilated.  Pericardium: There is no evidence of pericardial  effusion.  Mitral Valve: The mitral valve is normal in structure. No evidence of mitral valve regurgitation. No evidence of mitral valve stenosis.  Tricuspid Valve: The tricuspid valve is normal in structure. Tricuspid valve regurgitation is mild . No evidence of tricuspid stenosis.  Aortic Valve: The aortic valve is tricuspid. There is mild calcification of the aortic valve. There is mild thickening of the aortic valve. Aortic valve regurgitation is trivial. Aortic regurgitation PHT measures 401 msec. Aortic valve sclerosis is present, with no evidence of aortic valve stenosis. Aortic valve mean gradient measures 4.7 mmHg. Aortic valve peak gradient measures 8.3 mmHg. Aortic valve area, by VTI measures 2.46 cm.  Pulmonic Valve: The  pulmonic valve was normal in structure. Pulmonic valve regurgitation is not visualized. No evidence of pulmonic stenosis.  Aorta: The aortic root is normal in size and structure.  Venous: The inferior vena cava is normal in size with greater than 50% respiratory variability, suggesting right atrial pressure of 3 mmHg.  IAS/Shunts: No atrial level shunt detected by color flow Doppler.   LEFT VENTRICLE PLAX 2D LVIDd:         3.70 cm     Diastology LVIDs:         2.50 cm     LV e' medial:    5.91 cm/s LV PW:         1.00 cm     LV E/e' medial:  14.1 LV IVS:        1.00 cm     LV e' lateral:   6.85 cm/s LVOT diam:     1.80 cm     LV E/e' lateral: 12.2 LV SV:         46 LV SV Index:   27 LVOT Area:     2.54 cm  LV Volumes (MOD) LV vol d, MOD A2C: 51.3 ml LV vol d, MOD A4C: 48.9 ml LV vol s, MOD A2C: 21.6 ml LV vol s, MOD A4C: 23.6 ml LV SV MOD A2C:     29.7 ml LV SV MOD A4C:     48.9 ml LV SV MOD BP:      27.6 ml  RIGHT VENTRICLE RV Basal diam:  3.40 cm RV Mid diam:    3.00 cm RV S prime:     13.97 cm/s TAPSE (M-mode): 1.2 cm  LEFT ATRIUM           Index        RIGHT ATRIUM           Index LA diam:      2.90 cm 1.73 cm/m   RA Area:     13.20  cm LA Vol (A4C): 41.5 ml 24.76 ml/m  RA Volume:   31.90 ml  19.03 ml/m AORTIC VALVE                    PULMONIC VALVE AV Area (Vmax):    2.24 cm     PV Vmax:       1.05 m/s AV Area (Vmean):   2.14 cm     PV Peak grad:  4.4 mmHg AV Area (VTI):     2.46 cm AV Vmax:           144.00 cm/s AV Vmean:          97.033 cm/s AV VTI:            0.185 m AV Peak Grad:      8.3 mmHg AV Mean Grad:      4.7 mmHg LVOT Vmax:         127.00 cm/s LVOT Vmean:        81.633 cm/s LVOT VTI:          0.179 m LVOT/AV VTI ratio: 0.97 AI PHT:            401 msec  AORTA Ao Root diam: 3.10 cm Ao Asc diam:  2.70 cm  MITRAL VALVE               TRICUSPID VALVE MV Area (PHT): 3.88 cm    TR Peak grad:   25.8 mmHg MV Decel Time: 196 msec  TR Vmax:        254.00 cm/s MR Peak grad: 28.5 mmHg MR Vmax:      267.00 cm/s  SHUNTS MV E velocity: 83.43 cm/s  Systemic VTI:  0.18 m Systemic Diam: 1.80 cm  Charlton Haws MD Electronically signed by Charlton Haws MD Signature Date/Time: 01/14/2023/1:42:21 PM    Final    MONITORS  LONG TERM MONITOR (3-14 DAYS) 07/31/2021  Narrative Patch Wear Time:  7 days and 0 hours (2022-07-19T10:43:10-399 to 2022-07-26T11:34:46-0400)  Patient had a min HR of 36 bpm, max HR of 171 bpm, and avg HR of 69 bpm.  Predominant underlying rhythm was Sinus Rhythm. Bundle Branch Block/IVCD was present.  103 Supraventricular Tachycardia runs occurred, the run with the fastest interval lasting 4 beats with a max rate of 171 bpm, the longest lasting 11 beats with an avg rate of 131 bpm. Isolated SVEs were frequent (6.6%, 45837), SVE Couplets were occasional (1.3%, 4394), and SVE Triplets were rare (<1.0%, 1620).  Isolated VEs were rare (<1.0%), and no VE Couplets or VE Triplets were present.  Impression: Overall unremarkable event monitor.  Rare brief atrial runs.          Recent Labs: 03/28/2023: Magnesium 2.1 05/13/2023: Pro B Natriuretic peptide (BNP) 414.0 07/01/2023: TSH  2.45 08/05/2023: B Natriuretic Peptide 273.7; Hemoglobin 14.7; Platelets 201 08/06/2023: ALT 13; BUN 24; Creatinine, Ser 0.99; Potassium 3.8; Sodium 139  Recent Lipid Panel    Component Value Date/Time   CHOL 198 07/01/2023 1532   CHOL 180 06/21/2014 1449   CHOL 148 05/29/2013 1131   TRIG 148.0 07/01/2023 1532   TRIG 117 03/16/2014 1708   TRIG 133 05/29/2013 1131   HDL 37.10 (L) 07/01/2023 1532   HDL 56 06/21/2014 1449   HDL 55 03/16/2014 1708   HDL 48 05/29/2013 1131   CHOLHDL 5 07/01/2023 1532   VLDL 29.6 07/01/2023 1532   LDLCALC 131 (H) 07/01/2023 1532   LDLCALC 118 (H) 08/22/2020 1321   LDLCALC 77 03/16/2014 1708   LDLCALC 73 05/29/2013 1131   LDLDIRECT 168.0 11/20/2022 1615    History of Present Illness    89 year old female with the above past medial history including persistent atrial fibrillation, chronic diastolic heart failure, hypertension, hyperlipidemia, chronic renal insufficiency, and type 2 diabetes.   She was evaluated by Dr. Tomie China in 2022 in the setting of palpitations. Cardiac monitor in 07/2021 revealed PSVT.  Echo at the time showed severe concentric LVH, G3 DD, normal RV systolic function, no significant valvular abnormalities.  She was hospitalized in January 2024 in the setting of acute respiratory failure secondary to RSV, new atrial fibrillation with RVR.  Cardiology was consulted.  TSH was normal.  Repeat echocardiogram was normal.  She was started on Eliquis and metoprolol.  Given advanced age, renal dysfunction, borderline hypotension, rate control strategy was recommended.  She returned to the ED on 03/13/2023 with generalized weakness-EKG showed atrial fibrillation.  She had a brief period of tachycardia in the setting of having missed her evening dose of beta-blocker.  She was discharged home in stable condition.  At her follow-up visit in March 2024 she noted ongoing dyspnea on exertion, fatigue, poor appetite, generalized weakness, and low BP.  She  declined DCCV or any other invasive procedures.  Metoprolol was increased to 25 mg twice daily.  It was noted that she remained in atrial fibrillation, amiodarone 200 mg daily could be considered. Outpatient follow-up with palliative care was recommended.  She has had multiple ED  visits in the setting of bilateral lower extremity edema, improved with IV Lasix and increased oral Lasix dosing.  She was last seen in the office on 06/13/2023 and was stable from a cardiac standpoint.  Evaluated in the ED in August 2024 in the setting of altered mental status.  UA was negative for UTI, labs were unremarkable, it was felt that her mental status was likely related to underlying dementia.  Outpatient follow-up was advised.  She was evaluated by [facility] in the setting of lower extremity edema, duplex showed no evidence of reflux.  Ongoing management with compression and elevation was recommended.   She presents today for follow-up accompanied by her daughter. Since her last visit she has been    1. Persistent atrial fibrillation/history of falls: Diagnosed in January 2024. On Eliquis and metoprolol.  Rate control strategy was recommended.  She is overall asymptomatic as long as heart rate is well-controlled.  She denies any significant palpitations, denies dyspnea, worsening edema, PND, orthopnea, weight gain.  She has had an increase in falls over the past several months.  She denies head injury.  We discussed the benefits versus risks of ongoing anticoagulation therapy.  Patient and daughter prefer to continue Eliquis at this time.  Discussed ED precautions.  Continue metoprolol.   2. Chronic diastolic heart failure: Recent echo in 12/2022 was essentially normal.  Has nonpitting bilateral extremity edema, overall stable.  Otherwise, euvolemic and well compensated on exam.  Continue Lasix as needed.   3. Hypertension: BP well controlled. Continue current antihypertensive regimen.    4. CKD stage IIIb: Creatinine was  1.56 in 05/2023.  Monitored per PCP.   5. Type 2 diabetes: A1C was 6.6 in 03/2023.  Monitored and managed per PCP.   6. Disposition: Follow-up in  Home Medications    Current Outpatient Medications  Medication Sig Dispense Refill   acetaminophen (TYLENOL) 500 MG tablet Take 2 tablets (1,000 mg total) by mouth every 8 (eight) hours. 30 tablet 0   apixaban (ELIQUIS) 2.5 MG TABS tablet Take 1 tablet (2.5 mg total) by mouth 2 (two) times daily. 60 tablet 3   Cholecalciferol (VITAMIN D3) 50 MCG (2000 UT) capsule Take 2,000 Units by mouth daily.      diazepam (VALIUM) 5 MG tablet TAKE 1/2 TO 1 TABLET DAILY AS NEEDED FOR ANXIETY 30 tablet 1   furosemide (LASIX) 20 MG tablet Take 1 tablet (20 mg total) by mouth daily. Take for 3 days, then switch to as needed use for swelling associated with weight gain of 3 pounds in 24 hours or 6 pounds in a week. Let us know if you are having to use frequently so we can monitor kidney function. (Patient taking differently: Take 20 mg by mouth daily as needed.) 30 tablet 0   gabapentin (NEURONTIN) 800 MG tablet Take 1 tablet (800 mg total) by mouth 3 (three) times daily. (Patient taking differently: Take 800 mg by mouth. 2 or 3 times a day) 270 tablet 1   metoprolol tartrate (LOPRESSOR) 25 MG tablet Take 1 tablet twice daily. Please hold medication on the days systolic blood pressure (top number) is less than 90. 180 tablet 3   mometasone-formoterol (DULERA) 200-5 MCG/ACT AERO Inhale 2 puffs into the lungs 2 (two) times daily. (Patient taking differently: Inhale 2 puffs into the lungs as needed for wheezing or shortness of breath.) 1 each 2   QUEtiapine (SEROQUEL) 25 MG tablet TAKE 1 TABLET BY MOUTH AT BEDTIME AS NEEDED. 90 tablet 2  rOPINIRole (REQUIP) 1 MG tablet TAKE 1 TABLET BY MOUTH AT BEDTIME. 90 tablet 1   tiZANidine (ZANAFLEX) 4 MG tablet Take 1 tablet (4 mg total) by mouth 2 (two) times daily as needed for muscle spasms. (Patient taking differently: Take 4 mg  by mouth at bedtime.) 180 tablet 1   venlafaxine XR (EFFEXOR-XR) 37.5 MG 24 hr capsule TAKE 1 CAPSULE BY MOUTH DAILY WITH BREAKFAST. 90 capsule 1   Wound Dressings (ALLANTOIN) gel Apply topically as needed for wound care. 15 g 0   No current facility-administered medications for this visit.     Review of Systems    ***.  All other systems reviewed and are otherwise negative except as noted above.    Physical Exam    VS:  There were no vitals taken for this visit. , BMI There is no height or weight on file to calculate BMI.     GEN: Well nourished, well developed, in no acute distress. HEENT: normal. Neck: Supple, no JVD, carotid bruits, or masses. Cardiac: RRR, no murmurs, rubs, or gallops. No clubbing, cyanosis, edema.  Radials/DP/PT 2+ and equal bilaterally.  Respiratory:  Respirations regular and unlabored, clear to auscultation bilaterally. GI: Soft, nontender, nondistended, BS + x 4. MS: no deformity or atrophy. Skin: warm and dry, no rash. Neuro:  Strength and sensation are intact. Psych: Normal affect.  Accessory Clinical Findings    ECG personally reviewed by me today -    - no acute changes.   Lab Results  Component Value Date   WBC 5.3 08/05/2023   HGB 14.7 08/05/2023   HCT 46.7 (H) 08/05/2023   MCV 84.3 08/05/2023   PLT 201 08/05/2023   Lab Results  Component Value Date   CREATININE 0.99 08/06/2023   BUN 24 (H) 08/06/2023   NA 139 08/06/2023   K 3.8 08/06/2023   CL 104 08/06/2023   CO2 24 08/06/2023   Lab Results  Component Value Date   ALT 13 08/06/2023   AST 17 08/06/2023   ALKPHOS 52 08/06/2023   BILITOT 0.4 08/06/2023   Lab Results  Component Value Date   CHOL 198 07/01/2023   HDL 37.10 (L) 07/01/2023   LDLCALC 131 (H) 07/01/2023   LDLDIRECT 168.0 11/20/2022   TRIG 148.0 07/01/2023   CHOLHDL 5 07/01/2023    Lab Results  Component Value Date   HGBA1C 6.5 07/01/2023    Assessment & Plan    1.  ***  No BP recorded.  {Refresh Note OR  Click here to enter BP  :1}***   Joylene Grapes, NP 09/24/2023, 6:45 AM

## 2023-10-01 ENCOUNTER — Telehealth: Payer: Self-pay | Admitting: Family Medicine

## 2023-10-01 NOTE — Telephone Encounter (Signed)
Dee, Palliative Nurse with Dana Burns, wanted to advise provider that about 2 weeks, Dana Burns's daughter gave her half a seroquel pill for the nights in a row and pt developed a rash on her left arm. Patient had welts on her back from scratching from where she got itchy.  Dana Burns wanted to report in case it was not mentioned during her visit on 10/16/23. Please call [phone] if there are any questions.

## 2023-10-02 ENCOUNTER — Encounter: Payer: Self-pay | Admitting: Family Medicine

## 2023-10-11 ENCOUNTER — Other Ambulatory Visit: Payer: Self-pay | Admitting: Family Medicine

## 2023-10-15 ENCOUNTER — Ambulatory Visit: Payer: Medicare Other | Admitting: Family Medicine

## 2023-10-16 ENCOUNTER — Ambulatory Visit: Payer: Medicare Other | Admitting: Family Medicine

## 2023-10-23 ENCOUNTER — Ambulatory Visit: Payer: Medicare Other | Admitting: Family Medicine

## 2023-10-23 ENCOUNTER — Encounter: Payer: Self-pay | Admitting: Family Medicine

## 2023-10-23 VITALS — BP 128/68 | HR 60 | Ht 66.0 in | Wt 120.0 lb

## 2023-10-23 DIAGNOSIS — G894 Chronic pain syndrome: Secondary | ICD-10-CM | POA: Diagnosis not present

## 2023-10-23 DIAGNOSIS — R2689 Other abnormalities of gait and mobility: Secondary | ICD-10-CM

## 2023-10-23 DIAGNOSIS — I509 Heart failure, unspecified: Secondary | ICD-10-CM | POA: Diagnosis not present

## 2023-10-23 DIAGNOSIS — E1142 Type 2 diabetes mellitus with diabetic polyneuropathy: Secondary | ICD-10-CM

## 2023-10-23 DIAGNOSIS — E538 Deficiency of other specified B group vitamins: Secondary | ICD-10-CM | POA: Diagnosis not present

## 2023-10-23 DIAGNOSIS — I10 Essential (primary) hypertension: Secondary | ICD-10-CM | POA: Diagnosis not present

## 2023-10-23 DIAGNOSIS — M797 Fibromyalgia: Secondary | ICD-10-CM | POA: Diagnosis not present

## 2023-10-23 NOTE — Progress Notes (Signed)
Established Patient Office Visit  Subjective   Patient ID: Dana Burns, female    DOB: [birth year]  Age: 89 y.o. MRN: [id number]  Chief Complaint  Patient presents with   Medical Management of Chronic Issues    HPI  Discussed the use of AI scribe software for clinical note transcription with the patient, who gave verbal consent to proceed.  History of Present Illness   The patient, with a history of hypertension, presented for a routine three-month follow-up. She reported not regularly checking her blood pressure at home but stated it was within normal limits when occasionally checked. The patient has been compliant with her prescribed medications, with the exception of Seroquel, which was discontinued due to a suspected drug rash. The rash, which initially resembled poison ivy, spread to both arms and the back. Since discontinuation of Seroquel, the rash has resolved.  The patient also reported chronic leg pain, described as a deep ache that is worse in the mornings and improves slightly as the day progresses. The pain extends from the lower legs to the thighs. The patient also noted intermittent leg swelling, particularly in the ankles. Despite the pain and swelling, the patient has been ambulating with a walker and cane.  The patient has been taking over-the-counter vitamin B12 and vitamin D supplements, along with her prescribed medications, which include Lasix, taken as needed.  The patient's last blood work showed slightly low B12 levels.           ROS All review of systems negative except what is listed in the HPI    Objective:     BP 128/68   Pulse 60   Ht 5\' 6"  (1.676 m)   Wt 120 lb (54.4 kg)   SpO2 96%   BMI 19.37 kg/m    Physical Exam Vitals reviewed.  Constitutional:      Appearance: Normal appearance.  Cardiovascular:     Rate and Rhythm: Normal rate. Rhythm irregular.     Heart sounds: Normal heart sounds.  Pulmonary:     Effort: Pulmonary effort is  normal.     Breath sounds: Normal breath sounds.  Musculoskeletal:     Comments: Minimal BLE edema  Skin:    General: Skin is warm and dry.  Neurological:     Mental Status: She is alert and oriented to person, place, and time.  Psychiatric:        Mood and Affect: Mood normal.        Behavior: Behavior normal.        Thought Content: Thought content normal.        Judgment: Judgment normal.      No results found for any visits on 10/23/23.    The ASCVD Risk score (Arnett DK, et al., 2019) failed to calculate for the following reasons:   The 2019 ASCVD risk score is only valid for ages 8 to 73    Assessment & Plan:   Problem List Items Addressed This Visit       Active Problems   Essential hypertension, benign (Chronic)    Stable. Continue current regimen and heart healthy lifestyle.       Relevant Orders   CBC with Differential/Platelet   Comprehensive metabolic panel   TSH   Type 2 diabetes mellitus with peripheral neuropathy (HCC) - Primary (Chronic)    Previous A1c stable Recheck today Continue low-carb diet and exercises as tolerated      Relevant Orders   Hemoglobin A1c  Fibromyalgia    Continue current regimen and supportive measures      Relevant Orders   Ambulatory referral to Home Health   Vitamin B 12 deficiency    Sometimes missing supplement Recheck labs today       Relevant Orders   B12 and Folate Panel   Chronic pain syndrome    Continue current regimen.  Referral to PT Safety discussed       Relevant Orders   Ambulatory referral to Home Health   CHF (congestive heart failure) (HCC)    Mild lower extremity edema noted on exam. Patient reports shoes fitting tightly. -Advise daily weight checks and use of as-needed Lasix for fluid management. -Recommend elevation of legs and use of knee-high compression socks.      Other Visit Diagnoses     Impaired gait and mobility       Relevant Orders   Ambulatory referral to Home  Health       Return in about 3 months (around 01/23/2024) for routine follow-up.    Clayborne Dana, NP

## 2023-10-23 NOTE — Assessment & Plan Note (Signed)
Stable. Continue current regimen and heart healthy lifestyle.

## 2023-10-23 NOTE — Assessment & Plan Note (Signed)
Sometimes missing supplement Recheck labs today

## 2023-10-23 NOTE — Assessment & Plan Note (Signed)
Continue current regimen and supportive measures

## 2023-10-23 NOTE — Assessment & Plan Note (Signed)
Previous A1c stable Recheck today Continue low-carb diet and exercises as tolerated

## 2023-10-23 NOTE — Assessment & Plan Note (Signed)
Continue current regimen.  Referral to PT Safety discussed

## 2023-10-23 NOTE — Assessment & Plan Note (Signed)
Mild lower extremity edema noted on exam. Patient reports shoes fitting tightly. -Advise daily weight checks and use of as-needed Lasix for fluid management. -Recommend elevation of legs and use of knee-high compression socks.

## 2023-10-24 LAB — CBC WITH DIFFERENTIAL/PLATELET
Basophils Absolute: 0.1 10*3/uL (ref 0.0–0.1)
Basophils Relative: 1.3 % (ref 0.0–3.0)
Eosinophils Absolute: 0.1 10*3/uL (ref 0.0–0.7)
Eosinophils Relative: 1.4 % (ref 0.0–5.0)
HCT: 41.9 % (ref 36.0–46.0)
Hemoglobin: 13.1 g/dL (ref 12.0–15.0)
Lymphocytes Relative: 23.4 % (ref 12.0–46.0)
Lymphs Abs: 1.3 10*3/uL (ref 0.7–4.0)
MCHC: 31.3 g/dL (ref 30.0–36.0)
MCV: 87.8 fL (ref 78.0–100.0)
Monocytes Absolute: 0.5 10*3/uL (ref 0.1–1.0)
Monocytes Relative: 9.7 % (ref 3.0–12.0)
Neutro Abs: 3.4 10*3/uL (ref 1.4–7.7)
Neutrophils Relative %: 64.2 % (ref 43.0–77.0)
Platelets: 228 10*3/uL (ref 150.0–400.0)
RBC: 4.77 Mil/uL (ref 3.87–5.11)
RDW: 18.5 % — ABNORMAL HIGH (ref 11.5–15.5)
WBC: 5.4 10*3/uL (ref 4.0–10.5)

## 2023-10-24 LAB — B12 AND FOLATE PANEL
Folate: 14.3 ng/mL (ref >5.9)
Vitamin B-12: 335 pg/mL (ref 211–911)

## 2023-10-24 LAB — COMPREHENSIVE METABOLIC PANEL
ALT: 14 U/L (ref 0–35)
AST: 22 U/L (ref 0–37)
Albumin: 4.5 g/dL (ref 3.5–5.2)
Alkaline Phosphatase: 60 U/L (ref 39–117)
BUN: 31 mg/dL — ABNORMAL HIGH (ref 6–23)
CO2: 31 meq/L (ref 19–32)
Calcium: 9.9 mg/dL (ref 8.4–10.5)
Chloride: 103 meq/L (ref 96–112)
Creatinine, Ser: 1.27 mg/dL — ABNORMAL HIGH (ref 0.40–1.20)
GFR: 35.93 mL/min — ABNORMAL LOW (ref >60.00)
Glucose, Bld: 92 mg/dL (ref 70–99)
Potassium: 5.4 meq/L — ABNORMAL HIGH (ref 3.5–5.1)
Sodium: 140 meq/L (ref 135–145)
Total Bilirubin: 0.3 mg/dL (ref 0.2–1.2)
Total Protein: 7 g/dL (ref 6.0–8.3)

## 2023-10-24 LAB — HEMOGLOBIN A1C: Hgb A1c MFr Bld: 6.5 % (ref 4.6–6.5)

## 2023-10-24 LAB — TSH: TSH: 1.36 u[IU]/mL (ref 0.35–5.50)

## 2023-10-30 ENCOUNTER — Encounter: Payer: Self-pay | Admitting: Nurse Practitioner

## 2023-10-30 ENCOUNTER — Ambulatory Visit: Payer: Medicare Other | Attending: Nurse Practitioner | Admitting: Nurse Practitioner

## 2023-10-30 VITALS — BP 110/84 | HR 71 | Resp 16 | Ht 66.0 in | Wt 117.2 lb

## 2023-10-30 DIAGNOSIS — I5032 Chronic diastolic (congestive) heart failure: Secondary | ICD-10-CM

## 2023-10-30 DIAGNOSIS — E1142 Type 2 diabetes mellitus with diabetic polyneuropathy: Secondary | ICD-10-CM | POA: Diagnosis not present

## 2023-10-30 DIAGNOSIS — N1832 Chronic kidney disease, stage 3b: Secondary | ICD-10-CM

## 2023-10-30 DIAGNOSIS — I10 Essential (primary) hypertension: Secondary | ICD-10-CM

## 2023-10-30 DIAGNOSIS — I4819 Other persistent atrial fibrillation: Secondary | ICD-10-CM

## 2023-10-30 DIAGNOSIS — R600 Localized edema: Secondary | ICD-10-CM | POA: Diagnosis not present

## 2023-10-30 DIAGNOSIS — M79605 Pain in left leg: Secondary | ICD-10-CM

## 2023-10-30 DIAGNOSIS — M79604 Pain in right leg: Secondary | ICD-10-CM

## 2023-10-30 MED ORDER — APIXABAN 2.5 MG PO TABS: 2.5000 mg | ORAL_TABLET | Freq: Two times a day (BID) | ORAL | 0 refills | Status: DC

## 2023-10-30 NOTE — Patient Instructions (Signed)
Medication Instructions:  Your physician recommends that you continue on your current medications as directed. Please refer to the Current Medication list given to you today.  *If you need a refill on your cardiac medications before your next appointment, please call your pharmacy*   Lab Work: NONE ordered at this time of appointment     Testing/Procedures: NONE ordered at this time of appointment     Follow-Up: At [facility], you and your health needs are our priority.  As part of our continuing mission to provide you with exceptional heart care, we have created designated Provider Care Teams.  These Care Teams include your primary Cardiologist (physician) and Advanced Practice Providers (APPs -  Physician Assistants and Nurse Practitioners) who all work together to provide you with the care you need, when you need it.  We recommend signing up for the patient portal called "MyChart".  Sign up information is provided on this After Visit Summary.  MyChart is used to connect with patients for Virtual Visits (Telemedicine).  Patients are able to view lab/test results, encounter notes, upcoming appointments, etc.  Non-urgent messages can be sent to your provider as well.   To learn more about what you can do with MyChart, go to [email or web address].    Your next appointment:   4-6 month(s)  Provider:   Garwin Brothers, MD

## 2023-10-30 NOTE — Progress Notes (Signed)
Office Visit    Patient Name: Dana Burns Date of Encounter: 10/30/2023  Primary Care Provider:  Bradd Canary, MD Primary Cardiologist:  Garwin Brothers, MD  Chief Complaint    89 year old female with a history of persistent atrial fibrillation, chronic diastolic heart failure, hypertension, hyperlipidemia, chronic renal insufficiency, and type 2 diabetes presents for follow-up related to heart failure and atrial fibrillation.  Past Medical History    Past Medical History:  Diagnosis Date   Abdominal pain 10/25/17   Allergy    sneeze, rhinorrhea in am   Anxiety state 09/03/2013   Breast mass, right 10/14/2012   Chicken pox as a child   Chronic pain syndrome 01/23/2015   Chronic renal insufficiency 03/17/2011   Colon cancer (HCC)    Complication of anesthesia    agitated when waking up, wakes up shaking, "like  I'm going into shock"   Constipation 10-25-2017   Depression    husband died  3 months ago, pt. admits depression  currently    Diabetic neuropathy (HCC) 03/17/2011   DM (diabetes mellitus) type 2, uncontrolled, with ketoacidosis (HCC) 03/17/2011   Ductal hyperplasia of breast 03/17/2011   Essential hypertension, benign 05/31/2010   Qualifier: Diagnosis of  By: Antoine Poche, MD, Alinda Dooms 07/15/2021   Fibromyalgia    GERD (gastroesophageal reflux disease)    H/O echocardiogram    done /w [place] in 2011, saw Dr. Antoine Poche for tachycardia    Headache    History of colon cancer 03/17/2011   Hyperlipidemia    6 years   Hyperlipidemia, mixed    6 years   Hypertension    15 years, reports she has a rapid heartrate    Loss of weight 12/14/2013   Measles as a child   Osteoarthritis (arthritis due to wear and tear of joints) 03/17/2011   Noted diffusely including neck   Osteopenia 12/14/2013   Pain in finger of right hand 05/15/2017   Pain in joint, lower leg 09/03/2013   Pain of right shoulder region 07/07/2014   Peripheral neuropathy    SOB (shortness of  breath) 05/31/2010   Qualifier: Diagnosis of  By: Antoine Poche, MD, Gerrit Heck     Stress 06/01/2013   Tachycardia 05/31/2010   Qualifier: Diagnosis of  By: Antoine Poche, MD, Gerrit Heck     Type 2 diabetes mellitus with peripheral neuropathy (HCC) 03/17/2011   Vitamin B 12 deficiency 07/07/2014   intrinsic factor positive   Past Surgical History:  Procedure Laterality Date   BREAST EXCISIONAL BIOPSY Right 2013   benign   BREAST SURGERY  2013   right- benign   CATARACT EXTRACTION     /w IOL- bilateral    COLON SURGERY     For resection of cancer   Nodule removed     From throat    Allergies  Allergies  Allergen Reactions   Tape Itching and Rash   Actos [Pioglitazone Hydrochloride] Other (See Comments)    Unknown reaction   Buspar [Buspirone Hcl] Other (See Comments)    Unknown reaction   Doxycycline Other (See Comments)    Made the patient "feel funny"- in a negative way   Lipitor [Atorvastatin Calcium] Other (See Comments)    "It made me hurt all over."   Penicillins Rash   Seroquel [Quetiapine] Rash     Labs/Other Studies Reviewed    The following studies were reviewed today:  Cardiac Studies & Procedures  ECHOCARDIOGRAM  ECHOCARDIOGRAM COMPLETE 01/14/2023  Narrative ECHOCARDIOGRAM REPORT    Patient Name:   Dana Burns Date of Exam: 01/14/2023 Medical Rec #:  [id number]     Height:       66.0 in Accession #:    [id number]    Weight:       132.0 lb Date of Birth:  [birth year]     BSA:          1.676 m Patient Age:    89 years      BP:           107/46 mmHg Patient Gender: F             HR:           113 bpm. Exam Location:  Inpatient  Procedure: 2D Echo  Indications:    atrial fibrillation  History:        Patient has prior history of Echocardiogram examinations, most recent 08/03/2021. Arrythmias:Atrial Fibrillation and Tachycardia; Risk Factors:Diabetes and Hypertension.  Sonographer:    Cathie Hoops Referring Phys: Herminio Heads   Sonographer  Comments: Technically difficult study due to poor echo windows and suboptimal subcostal window. IMPRESSIONS   1. Left ventricular ejection fraction, by estimation, is 60 to 65%. The left ventricle has normal function. The left ventricle has no regional wall motion abnormalities. Left ventricular diastolic parameters are indeterminate. 2. Right ventricular systolic function is normal. The right ventricular size is normal. There is normal pulmonary artery systolic pressure. 3. Left atrial size was mildly dilated. 4. Right atrial size was mildly dilated. 5. The mitral valve is normal in structure. No evidence of mitral valve regurgitation. No evidence of mitral stenosis. 6. The aortic valve is tricuspid. There is mild calcification of the aortic valve. There is mild thickening of the aortic valve. Aortic valve regurgitation is trivial. Aortic valve sclerosis is present, with no evidence of aortic valve stenosis. 7. The inferior vena cava is normal in size with greater than 50% respiratory variability, suggesting right atrial pressure of 3 mmHg.  FINDINGS Left Ventricle: Left ventricular ejection fraction, by estimation, is 60 to 65%. The left ventricle has normal function. The left ventricle has no regional wall motion abnormalities. The left ventricular internal cavity size was normal in size. There is no left ventricular hypertrophy. Left ventricular diastolic parameters are indeterminate.  Right Ventricle: The right ventricular size is normal. No increase in right ventricular wall thickness. Right ventricular systolic function is normal. There is normal pulmonary artery systolic pressure. The tricuspid regurgitant velocity is 2.54 m/s, and with an assumed right atrial pressure of 3 mmHg, the estimated right ventricular systolic pressure is 28.8 mmHg.  Left Atrium: Left atrial size was mildly dilated.  Right Atrium: Right atrial size was mildly dilated.  Pericardium: There is no evidence of  pericardial effusion.  Mitral Valve: The mitral valve is normal in structure. No evidence of mitral valve regurgitation. No evidence of mitral valve stenosis.  Tricuspid Valve: The tricuspid valve is normal in structure. Tricuspid valve regurgitation is mild . No evidence of tricuspid stenosis.  Aortic Valve: The aortic valve is tricuspid. There is mild calcification of the aortic valve. There is mild thickening of the aortic valve. Aortic valve regurgitation is trivial. Aortic regurgitation PHT measures 401 msec. Aortic valve sclerosis is present, with no evidence of aortic valve stenosis. Aortic valve mean gradient measures 4.7 mmHg. Aortic valve peak gradient measures 8.3 mmHg. Aortic valve area, by VTI measures 2.46 cm.  Pulmonic Valve: The pulmonic valve was normal in structure. Pulmonic valve regurgitation is not visualized. No evidence of pulmonic stenosis.  Aorta: The aortic root is normal in size and structure.  Venous: The inferior vena cava is normal in size with greater than 50% respiratory variability, suggesting right atrial pressure of 3 mmHg.  IAS/Shunts: No atrial level shunt detected by color flow Doppler.   LEFT VENTRICLE PLAX 2D LVIDd:         3.70 cm     Diastology LVIDs:         2.50 cm     LV e' medial:    5.91 cm/s LV PW:         1.00 cm     LV E/e' medial:  14.1 LV IVS:        1.00 cm     LV e' lateral:   6.85 cm/s LVOT diam:     1.80 cm     LV E/e' lateral: 12.2 LV SV:         46 LV SV Index:   27 LVOT Area:     2.54 cm  LV Volumes (MOD) LV vol d, MOD A2C: 51.3 ml LV vol d, MOD A4C: 48.9 ml LV vol s, MOD A2C: 21.6 ml LV vol s, MOD A4C: 23.6 ml LV SV MOD A2C:     29.7 ml LV SV MOD A4C:     48.9 ml LV SV MOD BP:      27.6 ml  RIGHT VENTRICLE RV Basal diam:  3.40 cm RV Mid diam:    3.00 cm RV S prime:     13.97 cm/s TAPSE (M-mode): 1.2 cm  LEFT ATRIUM           Index        RIGHT ATRIUM           Index LA diam:      2.90 cm 1.73 cm/m   RA Area:      13.20 cm LA Vol (A4C): 41.5 ml 24.76 ml/m  RA Volume:   31.90 ml  19.03 ml/m AORTIC VALVE                    PULMONIC VALVE AV Area (Vmax):    2.24 cm     PV Vmax:       1.05 m/s AV Area (Vmean):   2.14 cm     PV Peak grad:  4.4 mmHg AV Area (VTI):     2.46 cm AV Vmax:           144.00 cm/s AV Vmean:          97.033 cm/s AV VTI:            0.185 m AV Peak Grad:      8.3 mmHg AV Mean Grad:      4.7 mmHg LVOT Vmax:         127.00 cm/s LVOT Vmean:        81.633 cm/s LVOT VTI:          0.179 m LVOT/AV VTI ratio: 0.97 AI PHT:            401 msec  AORTA Ao Root diam: 3.10 cm Ao Asc diam:  2.70 cm  MITRAL VALVE               TRICUSPID VALVE MV Area (PHT): 3.88 cm    TR Peak grad:   25.8 mmHg MV Decel Time: 196 msec  TR Vmax:        254.00 cm/s MR Peak grad: 28.5 mmHg MR Vmax:      267.00 cm/s  SHUNTS MV E velocity: 83.43 cm/s  Systemic VTI:  0.18 m Systemic Diam: 1.80 cm  Charlton Haws MD Electronically signed by Charlton Haws MD Signature Date/Time: 01/14/2023/1:42:21 PM    Final    MONITORS  LONG TERM MONITOR (3-14 DAYS) 07/31/2021  Narrative Patch Wear Time:  7 days and 0 hours (2022-07-19T10:43:10-399 to 2022-07-26T11:34:46-0400)  Patient had a min HR of 36 bpm, max HR of 171 bpm, and avg HR of 69 bpm.  Predominant underlying rhythm was Sinus Rhythm. Bundle Branch Block/IVCD was present.  103 Supraventricular Tachycardia runs occurred, the run with the fastest interval lasting 4 beats with a max rate of 171 bpm, the longest lasting 11 beats with an avg rate of 131 bpm. Isolated SVEs were frequent (6.6%, 45837), SVE Couplets were occasional (1.3%, 4394), and SVE Triplets were rare (<1.0%, 1620).  Isolated VEs were rare (<1.0%), and no VE Couplets or VE Triplets were present.  Impression: Overall unremarkable event monitor.  Rare brief atrial runs.          Recent Labs: 03/28/2023: Magnesium 2.1 05/13/2023: Pro B Natriuretic peptide (BNP) 414.0 08/05/2023: B  Natriuretic Peptide 273.7 10/23/2023: ALT 14; BUN 31; Creatinine, Ser 1.27; Hemoglobin 13.1; Platelets 228.0; Potassium 5.4 No hemolysis seen; Sodium 140; TSH 1.36  Recent Lipid Panel    Component Value Date/Time   CHOL 198 07/01/2023 1532   CHOL 180 06/21/2014 1449   CHOL 148 05/29/2013 1131   TRIG 148.0 07/01/2023 1532   TRIG 117 03/16/2014 1708   TRIG 133 05/29/2013 1131   HDL 37.10 (L) 07/01/2023 1532   HDL 56 06/21/2014 1449   HDL 55 03/16/2014 1708   HDL 48 05/29/2013 1131   CHOLHDL 5 07/01/2023 1532   VLDL 29.6 07/01/2023 1532   LDLCALC 131 (H) 07/01/2023 1532   LDLCALC 118 (H) 08/22/2020 1321   LDLCALC 77 03/16/2014 1708   LDLCALC 73 05/29/2013 1131   LDLDIRECT 168.0 11/20/2022 1615    History of Present Illness    89 year old female with the above past medial history including persistent atrial fibrillation, chronic diastolic heart failure, hypertension, hyperlipidemia, chronic renal insufficiency, and type 2 diabetes.   She was evaluated by Dr. Tomie China in 2022 in the setting of palpitations. Cardiac monitor in 07/2021 revealed PSVT.  Echo at the time showed severe concentric LVH, G3 DD, normal RV systolic function, no significant valvular abnormalities.  She was hospitalized in January 2024 in the setting of acute respiratory failure secondary to RSV, new atrial fibrillation with RVR.  Cardiology was consulted.  TSH was normal.  Repeat echocardiogram was normal.  She was started on Eliquis and metoprolol.  Given advanced age, renal dysfunction, borderline hypotension, rate control strategy was recommended.  She returned to the ED on 03/13/2023 with generalized weakness-EKG showed atrial fibrillation.  She had a brief period of tachycardia in the setting of having missed her evening dose of beta-blocker.  She was discharged home in stable condition.  At her follow-up visit in March 2024 she noted ongoing dyspnea on exertion, fatigue, poor appetite, generalized weakness, and low  BP.  She declined DCCV or any other invasive procedures.  Metoprolol was increased to 25 mg twice daily.  It was noted that she remained in atrial fibrillation, amiodarone 200 mg daily could be considered. Outpatient follow-up with palliative care was recommended.  She has had  multiple ED visits in the setting of bilateral lower extremity edema, improved with IV Lasix and increased oral Lasix dosing.  She was last seen in the office on 06/13/2023 and was stable from a cardiac standpoint.  Evaluated in the ED in August 2024 in the setting of altered mental status.  UA was negative for UTI, labs were unremarkable, it was felt that her mental status was likely related to underlying dementia.  Outpatient follow-up was advised.  She was evaluated by [facility] in the setting of lower extremity edema, duplex showed no evidence of reflux.  Ongoing management with compression and elevation was recommended.   She presents today for follow-up accompanied by her daughter. Since her last visit she has been stable from a cardiac standpoint.  She continues to note bilateral leg pain, mild nonpitting lower extremity edema, overall unchanged from prior visits.  She notes that she has seen her rheumatologist, orthopedist, vascular surgeon, in addition to cardiology and has not been able to find a answer as to why she continues to have leg pain.  She notes occasional lightheadedness, denies any palpitations, presyncope, syncope, denies symptoms concerning for angina.  Overall, her symptoms are stable.  Home Medications    Current Outpatient Medications  Medication Sig Dispense Refill   acetaminophen (TYLENOL) 500 MG tablet Take 2 tablets (1,000 mg total) by mouth every 8 (eight) hours. 30 tablet 0   Cholecalciferol (VITAMIN D3) 50 MCG (2000 UT) capsule Take 2,000 Units by mouth daily.      diazepam (VALIUM) 5 MG tablet TAKE 1/2 TO 1 TABLET DAILY AS NEEDED FOR ANXIETY 30 tablet 1   furosemide (LASIX) 20 MG tablet Take 1 tablet (20  mg total) by mouth daily. Take for 3 days, then switch to as needed use for swelling associated with weight gain of 3 pounds in 24 hours or 6 pounds in a week. Let us know if you are having to use frequently so we can monitor kidney function. (Patient taking differently: Take 20 mg by mouth daily as needed.) 30 tablet 0   gabapentin (NEURONTIN) 800 MG tablet Take 1 tablet (800 mg total) by mouth 3 (three) times daily. (Patient taking differently: Take 800 mg by mouth. 2 or 3 times a day) 270 tablet 1   metoprolol tartrate (LOPRESSOR) 25 MG tablet Take 1 tablet twice daily. Please hold medication on the days systolic blood pressure (top number) is less than 90. 180 tablet 3   mometasone-formoterol (DULERA) 200-5 MCG/ACT AERO Inhale 2 puffs into the lungs 2 (two) times daily. (Patient taking differently: Inhale 2 puffs into the lungs as needed for wheezing or shortness of breath.) 1 each 2   QUEtiapine (SEROQUEL) 25 MG tablet TAKE 1 TABLET BY MOUTH AT BEDTIME AS NEEDED. 90 tablet 2   rOPINIRole (REQUIP) 1 MG tablet TAKE 1 TABLET BY MOUTH EVERYDAY AT BEDTIME 90 tablet 1   tiZANidine (ZANAFLEX) 4 MG tablet Take 1 tablet (4 mg total) by mouth 2 (two) times daily as needed for muscle spasms. (Patient taking differently: Take 4 mg by mouth at bedtime.) 180 tablet 1   venlafaxine XR (EFFEXOR-XR) 37.5 MG 24 hr capsule TAKE 1 CAPSULE BY MOUTH DAILY WITH BREAKFAST. 90 capsule 1   apixaban (ELIQUIS) 2.5 MG TABS tablet Take 1 tablet (2.5 mg total) by mouth 2 (two) times daily. 28 tablet 0   No current facility-administered medications for this visit.     Review of Systems    She denies chest pain, palpitations, dyspnea,  pnd, orthopnea, n, v, dizziness, syncope, weight gain, or early satiety. All other systems reviewed and are otherwise negative except as noted above.   Physical Exam    VS:  BP 110/84 (BP Location: Left Arm, Patient Position: Sitting, Cuff Size: Normal)   Pulse 71   Resp 16   Ht 5\' 6"  (1.676  m)   Wt 117 lb 3.2 oz (53.2 kg)   SpO2 90%   BMI 18.92 kg/m  GEN: Well nourished, well developed, in no acute distress. HEENT: normal. Neck: Supple, no JVD, carotid bruits, or masses. Cardiac: RRR, no murmurs, rubs, or gallops. No clubbing, cyanosis, edema.  Radials/DP/PT 2+ and equal bilaterally.  Respiratory:  Respirations regular and unlabored, clear to auscultation bilaterally. GI: Soft, nontender, nondistended, BS + x 4. MS: no deformity or atrophy. Skin: warm and dry, no rash. Neuro:  Strength and sensation are intact. Psych: Normal affect.  Accessory Clinical Findings    ECG personally reviewed by me today - EKG Interpretation Date/Time:  Wednesday October 30 2023 15:02:22 EDT Ventricular Rate:  71 PR Interval:    QRS Duration:  118 QT Interval:  382 QTC Calculation: 415 R Axis:   -60  Text Interpretation: Atrial fibrillation with premature ventricular or aberrantly conducted complexes Right bundle branch block Left anterior fascicular block Bifascicular block When compared with ECG of 05-Aug-2023 23:48, Vent. rate has decreased BY  56 BPM Confirmed by Bernadene Person ([id number]) on 10/30/2023 3:11:00 PM  - no acute changes.   Lab Results  Component Value Date   WBC 5.4 10/23/2023   HGB 13.1 10/23/2023   HCT 41.9 10/23/2023   MCV 87.8 10/23/2023   PLT 228.0 10/23/2023   Lab Results  Component Value Date   CREATININE 1.27 (H) 10/23/2023   BUN 31 (H) 10/23/2023   NA 140 10/23/2023   K 5.4 No hemolysis seen (H) 10/23/2023   CL 103 10/23/2023   CO2 31 10/23/2023   Lab Results  Component Value Date   ALT 14 10/23/2023   AST 22 10/23/2023   ALKPHOS 60 10/23/2023   BILITOT 0.3 10/23/2023   Lab Results  Component Value Date   CHOL 198 07/01/2023   HDL 37.10 (L) 07/01/2023   LDLCALC 131 (H) 07/01/2023   LDLDIRECT 168.0 11/20/2022   TRIG 148.0 07/01/2023   CHOLHDL 5 07/01/2023    Lab Results  Component Value Date   HGBA1C 6.5 10/23/2023    Assessment & Plan     1. Persistent atrial fibrillation/history of falls: Diagnosed in January 2024. On Eliquis and metoprolol.  Rate control strategy was recommended.  She is overall asymptomatic as long as heart rate is well-controlled. She has a history of frequent falls, denies bleeding.  We have discussed the benefits versus risks of ongoing anticoagulation therapy.  Patient and daughter prefer to continue Eliquis at this time.  Continue metoprolol, Eliquis.  Samples of Eliquis provided in office today.   2. Chronic diastolic heart failure: Recent echo in 12/2022 was essentially normal.  She has nonpitting bilateral extremity edema, overall stable.  Otherwise, euvolemic and well compensated on exam.  Encouraged compression, elevation.  Recent BMETwith hyperkalemia, she is pending repeat labs per PCP.  Continue Lasix as needed.   3. Hypertension: BP well controlled. Continue current antihypertensive regimen.    4. CKD stage IIIb: Creatinine was 1.27 in 10/2023.  Monitored per PCP.   5. Type 2 diabetes: A1C was 6.5 in 10/2023.  Monitored and managed per PCP.   6.  Bilateral  lower extremity edema/bilateral leg pain: Ongoing. She has been evaluated by cardiology, vascular surgery, rheumatology, and orthopedics without identifiable cause for her symptoms.  Encouraged ambulation with walker, compression, elevation, as needed Lasix as above.  7. Disposition: Follow-up in 4-6 months with Dr. Tomie China.       Joylene Grapes, NP 10/30/2023, 4:10 PM

## 2023-10-31 ENCOUNTER — Telehealth: Payer: Self-pay | Admitting: Family Medicine

## 2023-10-31 DIAGNOSIS — G894 Chronic pain syndrome: Secondary | ICD-10-CM

## 2023-10-31 DIAGNOSIS — M797 Fibromyalgia: Secondary | ICD-10-CM

## 2023-10-31 DIAGNOSIS — R2689 Other abnormalities of gait and mobility: Secondary | ICD-10-CM

## 2023-10-31 NOTE — Telephone Encounter (Signed)
Dana Burns with [facility] called to advise she received a fax from our office but they cannot accept as she does not have a team member in that area.

## 2023-11-01 ENCOUNTER — Other Ambulatory Visit: Payer: Self-pay | Admitting: Family Medicine

## 2023-11-04 ENCOUNTER — Other Ambulatory Visit (INDEPENDENT_AMBULATORY_CARE_PROVIDER_SITE_OTHER): Payer: Medicare Other

## 2023-11-04 DIAGNOSIS — E875 Hyperkalemia: Secondary | ICD-10-CM

## 2023-11-04 NOTE — Telephone Encounter (Signed)
Called patient and spoke with daughter. Daughter stated her mother has [employer]. Would like to know if this will cover HH?

## 2023-11-04 NOTE — Telephone Encounter (Addendum)
Please call patient and speak with her or family member.  They will need to call and speak with insurance to see if they can pull up companies that are covered and let us know.

## 2023-11-05 ENCOUNTER — Other Ambulatory Visit: Payer: Self-pay | Admitting: Family Medicine

## 2023-11-05 DIAGNOSIS — E875 Hyperkalemia: Secondary | ICD-10-CM

## 2023-11-05 LAB — BASIC METABOLIC PANEL
BUN: 30 mg/dL — ABNORMAL HIGH (ref 6–23)
CO2: 30 meq/L (ref 19–32)
Calcium: 9.9 mg/dL (ref 8.4–10.5)
Chloride: 103 meq/L (ref 96–112)
Creatinine, Ser: 1.25 mg/dL — ABNORMAL HIGH (ref 0.40–1.20)
GFR: 36.61 mL/min — ABNORMAL LOW (ref >60.00)
Glucose, Bld: 94 mg/dL (ref 70–99)
Potassium: 5.2 meq/L — ABNORMAL HIGH (ref 3.5–5.1)
Sodium: 141 meq/L (ref 135–145)

## 2023-11-07 ENCOUNTER — Telehealth: Payer: Self-pay | Admitting: *Deleted

## 2023-11-07 ENCOUNTER — Other Ambulatory Visit: Payer: Self-pay | Admitting: Emergency Medicine

## 2023-11-07 DIAGNOSIS — R4181 Age-related cognitive decline: Secondary | ICD-10-CM

## 2023-11-07 DIAGNOSIS — I509 Heart failure, unspecified: Secondary | ICD-10-CM

## 2023-11-07 DIAGNOSIS — F411 Generalized anxiety disorder: Secondary | ICD-10-CM

## 2023-11-07 DIAGNOSIS — L89212 Pressure ulcer of right hip, stage 2: Secondary | ICD-10-CM

## 2023-11-07 DIAGNOSIS — I482 Chronic atrial fibrillation, unspecified: Secondary | ICD-10-CM

## 2023-11-07 DIAGNOSIS — M25569 Pain in unspecified knee: Secondary | ICD-10-CM

## 2023-11-07 DIAGNOSIS — E43 Unspecified severe protein-calorie malnutrition: Secondary | ICD-10-CM

## 2023-11-07 DIAGNOSIS — R296 Repeated falls: Secondary | ICD-10-CM

## 2023-11-07 DIAGNOSIS — E1149 Type 2 diabetes mellitus with other diabetic neurological complication: Secondary | ICD-10-CM

## 2023-11-07 NOTE — Addendum Note (Signed)
Addended by: Thelma Barge D on: 11/07/2023 10:27 AM   Modules accepted: Orders

## 2023-11-07 NOTE — Telephone Encounter (Signed)
Referral placed.

## 2023-11-07 NOTE — Telephone Encounter (Signed)
Spoke with Daughter and she stated that we can place the referral.  Referral placed.

## 2023-11-07 NOTE — Progress Notes (Signed)
  Care Coordination   Note   11/07/2023 Name: Dana Burns MRN: [id number] DOB: [birth year]  Dana Burns is a 89 y.o. year old female who sees Bradd Canary, MD for primary care. I reached out to Dana Burns by phone today to offer care coordination services.  [name] was given information about Care Coordination services today including:   The Care Coordination services include support from the care team which includes your Nurse Coordinator, Clinical Social Worker, or Pharmacist.  The Care Coordination team is here to help remove barriers to the health concerns and goals most important to you. Care Coordination services are voluntary, and the patient may decline or stop services at any time by request to their care team member.   Care Coordination Consent Status: Patient agreed to services and verbal consent obtained.   Follow up plan:  Telephone appointment with care coordination team member scheduled for:  11/08/2023  Encounter Outcome:  Patient Scheduled from referral   Burman Nieves, [facility] Care Coordination Care Guide Direct Dial: [phone]

## 2023-11-08 ENCOUNTER — Ambulatory Visit: Payer: Self-pay | Admitting: Licensed Clinical Social Worker

## 2023-11-11 NOTE — Patient Outreach (Signed)
  Care Coordination   Initial Visit Note   11/11/2023 Name: Dana Burns MRN: [id number] DOB: 1926-04-17  Dana Burns is a 89 y.o. year old female who sees Bradd Canary, MD for primary care. I spoke with  Glory Buff  by phone today.  What matters to the patients health and wellness today?  Patient daughter requested information regarding personal care assistance.     Goals Addressed   None     SDOH assessments and interventions completed:  Yes     Care Coordination Interventions:  No, not indicated   Follow up plan: No further intervention required.   Encounter Outcome:  Patient Visit Completed    Gwyndolyn Saxon MSW, LCSW Licensed Clinical Social Worker  [facility], [facility] Direct Dial: [phone]  Fax: [phone]

## 2023-11-16 ENCOUNTER — Other Ambulatory Visit: Payer: Self-pay | Admitting: Family Medicine

## 2023-11-19 ENCOUNTER — Other Ambulatory Visit: Payer: Medicare Other

## 2023-11-25 ENCOUNTER — Other Ambulatory Visit: Payer: Medicare Other

## 2023-12-02 ENCOUNTER — Other Ambulatory Visit: Payer: Medicare Other

## 2023-12-11 ENCOUNTER — Other Ambulatory Visit: Payer: Self-pay | Admitting: Nurse Practitioner

## 2023-12-11 ENCOUNTER — Telehealth: Payer: Self-pay | Admitting: Nurse Practitioner

## 2023-12-11 DIAGNOSIS — I4819 Other persistent atrial fibrillation: Secondary | ICD-10-CM

## 2023-12-11 MED ORDER — APIXABAN 2.5 MG PO TABS
2.5000 mg | ORAL_TABLET | Freq: Two times a day (BID) | ORAL | 1 refills | Status: DC
Start: 2023-12-11 — End: 2024-01-14

## 2023-12-11 NOTE — Telephone Encounter (Signed)
Prescription refill request for Eliquis received. Indication: Afib  Last office visit: 10/30/23 ([facility])  Scr: 1.25 (11/04/23)  Age: 89 Weight: 53.2kg  Appropriate dose. Refill sent.

## 2023-12-11 NOTE — Telephone Encounter (Signed)
*  STAT* If patient is at the pharmacy, call can be transferred to refill team.   1. Which medications need to be refilled? (please list name of each medication and dose if known)   apixaban (ELIQUIS) 2.5 MG TABS tablet    4. Which pharmacy/location (including street and city if local pharmacy) is medication to be sent to? [id number] - [place], [place] - [address]     5. Do they need a 30 day or 90 day supply? 90

## 2023-12-11 NOTE — Telephone Encounter (Signed)
Already filled

## 2024-01-01 ENCOUNTER — Encounter (HOSPITAL_COMMUNITY): Payer: Self-pay

## 2024-01-01 ENCOUNTER — Other Ambulatory Visit: Payer: Self-pay

## 2024-01-01 ENCOUNTER — Emergency Department (HOSPITAL_COMMUNITY)
Admission: EM | Admit: 2024-01-01 | Discharge: 2024-01-01 | Discharge status: Against Medical Advice | Payer: Medicare Other | Attending: Emergency Medicine | Admitting: Emergency Medicine

## 2024-01-01 DIAGNOSIS — R404 Transient alteration of awareness: Secondary | ICD-10-CM | POA: Diagnosis not present

## 2024-01-01 DIAGNOSIS — U071 COVID-19: Secondary | ICD-10-CM | POA: Insufficient documentation

## 2024-01-01 DIAGNOSIS — Z5321 Procedure and treatment not carried out due to patient leaving prior to being seen by health care provider: Secondary | ICD-10-CM | POA: Diagnosis not present

## 2024-01-01 DIAGNOSIS — R059 Cough, unspecified: Secondary | ICD-10-CM | POA: Diagnosis not present

## 2024-01-01 DIAGNOSIS — G4489 Other headache syndrome: Secondary | ICD-10-CM | POA: Diagnosis not present

## 2024-01-01 LAB — RESP PANEL BY RT-PCR (RSV, FLU A&B, COVID)  RVPGX2
Influenza A by PCR: NEGATIVE
Influenza B by PCR: NEGATIVE
Resp Syncytial Virus by PCR: NEGATIVE
SARS Coronavirus 2 by RT PCR: POSITIVE — AB

## 2024-01-01 NOTE — ED Triage Notes (Signed)
 Pt BIB EMS from home with cough and fatigue x 1 week. Daughter has covid.

## 2024-01-01 NOTE — ED Notes (Signed)
 Pt's son came to the lobby and got her. He was upset that she was in the lobby.

## 2024-01-02 ENCOUNTER — Telehealth: Payer: Medicare Other | Admitting: Family Medicine

## 2024-01-02 ENCOUNTER — Telehealth: Payer: Self-pay

## 2024-01-02 DIAGNOSIS — U071 COVID-19: Secondary | ICD-10-CM | POA: Diagnosis not present

## 2024-01-02 MED ORDER — NIRMATRELVIR/RITONAVIR (PAXLOVID) TABLET (RENAL DOSING): 2.0000 | ORAL_TABLET | Freq: Two times a day (BID) | ORAL | 0 refills | Status: DC

## 2024-01-02 MED ORDER — MOLNUPIRAVIR EUA 200MG CAPSULE
4.0000 | ORAL_CAPSULE | Freq: Two times a day (BID) | ORAL | 0 refills | Status: AC
Start: 2024-01-02 — End: 2024-01-07

## 2024-01-02 NOTE — Telephone Encounter (Signed)
Toc

## 2024-01-02 NOTE — Progress Notes (Signed)
 [facility] [address] [place], [place] [address] [phone]  Date:  01/02/2024   Name:  Dana Burns   DOB:  Jan 23, 1926   MRN:  [id number]  PCP:  Dana Harlene LABOR, MD    Chief Complaint: No chief complaint on file.   History of Present Illness:  Dana Burns is a 89 y.o. very pleasant female patient who presents with the following:  Primary pt of Dr Dana- I did a virtual visit with her about a year ago when she was quite ill and declined further care.  I was afraid she was going to pass away at that time, but she recovered Patient location is home, location is office.  Patient identity confirmed with 2 factors, I connected with the patient's daughter today via MyChart video.  Daughter gives consent for virtual visit today  Per most recent cardiology note 10/30/23-  89 year old female with a history of persistent atrial fibrillation, chronic diastolic heart failure, hypertension, hyperlipidemia, chronic renal insufficiency, and type 2 diabetes presents for follow-up related to heart failure and atrial fibrillation.   Today I spoke with her daughter- Dana Burns She notes that 2 weeks ago her mom complained of feeling really tired, she has been in bed for over a week Her daughter has been ill also- she has noted a high fever herself -she took a test and found that she has COVID  EMS came to the house yesterday and took [name] to the ER- she was tested for flu, covid, rsv and tested + for covid and ended up leaving the ER prior to being seen due to long wait  She is in the bed asleep right now and cannot come to the phone Besides being quite tired her daughter does not feel her symptoms are overly severe Her daughter notes [name] oxygen is running 97-98 BP was 120/70 yesterday with EMS   [facility]  Patient Active Problem List   Diagnosis Date Noted   Moderate dementia with psychotic disturbance (HCC)  08/06/2023   Age-related cognitive decline 07/01/2023   Stage II pressure ulcer of right hip (HCC) 07/01/2023   Sciatica 04/09/2023   Peripheral edema 03/28/2023   Counseling and coordination of care 03/28/2023   Palliative care encounter 03/28/2023   CHF (congestive heart failure) (HCC) 03/27/2023   Chronic atrial fibrillation (HCC) 03/27/2023   Frequent falls 03/27/2023   Chronic kidney disease, stage 3b (HCC) 03/27/2023   Right knee pain 03/27/2023   Goals of care, counseling/discussion 03/27/2023   Protein-calorie malnutrition, severe 01/10/2023   RLS (restless legs syndrome) 11/21/2022   Foot pain, bilateral 01/22/2022   Skin lesion 01/22/2022   Diminished pulses in lower extremity 01/22/2022   Urinary hesitancy 10/13/2021   Palpitations 07/18/2021   Allergy 07/17/2021   Complication of anesthesia 07/17/2021   Depression with anxiety 07/17/2021   H/O echocardiogram 07/17/2021   Headache 07/17/2021   Falls 07/15/2021   Abdominal pain 09/29/2017   Constipation 09/29/2017   Pain in finger of right hand 05/15/2017   Chronic pain syndrome 01/23/2015   Vitamin B 12 deficiency 07/07/2014   Pain of right shoulder region 07/07/2014   Chicken pox    Measles    Peripheral neuropathy 07/01/2014   Loss of weight 12/14/2013   Osteopenia 12/14/2013   Anxiety state 09/03/2013   Pain in joint, lower leg 09/03/2013   Fibromyalgia 06/01/2013   Breast mass, right 10/14/2012   Type 2  diabetes mellitus with peripheral neuropathy (HCC) 03/17/2011   Osteoarthritis (arthritis due to wear and tear of joints) 03/17/2011   Diabetic neuropathy (HCC) 03/17/2011   Chronic renal insufficiency 03/17/2011   History of colon cancer 03/17/2011   GERD (gastroesophageal reflux disease) 03/17/2011   Ductal hyperplasia of breast 03/17/2011   Essential hypertension, benign 05/31/2010   Tachycardia 05/31/2010   SOB (shortness of breath) 05/31/2010    Past Medical History:  Diagnosis Date    Abdominal pain 10/12/17   Allergy    sneeze, rhinorrhea in am   Anxiety state 09/03/2013   Breast mass, right 10/14/2012   Chicken pox as a child   Chronic pain syndrome 01/23/2015   Chronic renal insufficiency 03/17/2011   Colon cancer (HCC)    Complication of anesthesia    agitated when waking up, wakes up shaking, like  I'm going into shock   Constipation 2017-10-12   Depression    husband died  3 months ago, pt. admits depression  currently    Diabetic neuropathy (HCC) 03/17/2011   DM (diabetes mellitus) type 2, uncontrolled, with ketoacidosis (HCC) 03/17/2011   Ductal hyperplasia of breast 03/17/2011   Essential hypertension, benign 05/31/2010   Qualifier: Diagnosis of  By: Lavona, MD, CODY Lynwood Noel 07/15/2021   Fibromyalgia    GERD (gastroesophageal reflux disease)    H/O echocardiogram    done /w Barrera in 2011, saw Dr. Lavona for tachycardia    Headache    History of colon cancer 03/17/2011   Hyperlipidemia    6 years   Hyperlipidemia, mixed    6 years   Hypertension    15 years, reports she has a rapid heartrate    Loss of weight 12/14/2013   Measles as a child   Osteoarthritis (arthritis due to wear and tear of joints) 03/17/2011   Noted diffusely including neck   Osteopenia 12/14/2013   Pain in finger of right hand 05/15/2017   Pain in joint, lower leg 09/03/2013   Pain of right shoulder region 07/07/2014   Peripheral neuropathy    SOB (shortness of breath) 05/31/2010   Qualifier: Diagnosis of  By: Lavona, MD, CODY Lynwood     Stress 06/01/2013   Tachycardia 05/31/2010   Qualifier: Diagnosis of  By: Lavona, MD, CODY Lynwood     Type 2 diabetes mellitus with peripheral neuropathy (HCC) 03/17/2011   Vitamin B 12 deficiency 07/07/2014   intrinsic factor positive    Past Surgical History:  Procedure Laterality Date   BREAST EXCISIONAL BIOPSY Right 2013   benign   BREAST SURGERY  2013   right- benign   CATARACT EXTRACTION     /w IOL- bilateral    COLON  SURGERY     For resection of cancer   Nodule removed     From throat    Social History   Tobacco Use   Smoking status: Never   Smokeless tobacco: Never  Substance Use Topics   Alcohol  use: No   Drug use: No    Family History  Problem Relation Age of Onset   Cancer Mother        Brain   Cancer Father        Colorectal   Cancer Sister        breast   Other Brother        pacemaker   Cancer Brother        pancreatic cancer   Arthritis Sister    Emphysema Brother  Cancer Brother    COPD Brother    Neuropathy Daughter    Fibromyalgia Daughter    Cancer Brother        metastatic colon cancer   Heart disease Neg Hx        Early    Allergies  Allergen Reactions   Tape Itching and Rash   Actos [Pioglitazone Hydrochloride] Other (See Comments)    Unknown reaction   Buspar [Buspirone Hcl] Other (See Comments)    Unknown reaction   Doxycycline  Other (See Comments)    Made the patient feel funny- in a negative way   Lipitor [Atorvastatin Calcium] Other (See Comments)    It made me hurt all over.   Penicillins Rash   Seroquel  [Quetiapine ] Rash    Medication list has been reviewed and updated.  Current Outpatient Medications on File Prior to Visit  Medication Sig Dispense Refill   acetaminophen  (TYLENOL ) 500 MG tablet Take 2 tablets (1,000 mg total) by mouth every 8 (eight) hours. 30 tablet 0   apixaban  (ELIQUIS ) 2.5 MG TABS tablet Take 1 tablet (2.5 mg total) by mouth 2 (two) times daily. 180 tablet 1   Cholecalciferol (VITAMIN D3) 50 MCG (2000 UT) capsule Take 2,000 Units by mouth daily.      diazepam  (VALIUM ) 5 MG tablet TAKE 1/2 TO 1 TABLET DAILY AS NEEDED FOR ANXIETY 30 tablet 1   furosemide  (LASIX ) 20 MG tablet Take 1 tablet (20 mg total) by mouth daily. Take for 3 days, then switch to as needed use for swelling associated with weight gain of 3 pounds in 24 hours or 6 pounds in a week. Let us  know if you are having to use frequently so we can monitor kidney  function. (Patient taking differently: Take 20 mg by mouth daily as needed.) 30 tablet 0   gabapentin  (NEURONTIN ) 800 MG tablet Take 1 tablet (800 mg total) by mouth 3 (three) times daily. (Patient taking differently: Take 800 mg by mouth. 2 or 3 times a day) 270 tablet 1   metoprolol  tartrate (LOPRESSOR ) 25 MG tablet Take 1 tablet twice daily. Please hold medication on the days systolic blood pressure (top number) is less than 90. 180 tablet 3   mometasone -formoterol  (DULERA ) 200-5 MCG/ACT AERO Inhale 2 puffs into the lungs 2 (two) times daily. (Patient taking differently: Inhale 2 puffs into the lungs as needed for wheezing or shortness of breath.) 1 each 2   QUEtiapine  (SEROQUEL ) 25 MG tablet TAKE 1 TABLET BY MOUTH AT BEDTIME AS NEEDED. 90 tablet 2   rOPINIRole  (REQUIP ) 1 MG tablet TAKE 1 TABLET BY MOUTH EVERYDAY AT BEDTIME 90 tablet 1   tiZANidine  (ZANAFLEX ) 4 MG tablet TAKE 1 TABLET (4 MG TOTAL) BY MOUTH 2 (TWO) TIMES DAILY AS NEEDED FOR MUSCLE SPASMS. 180 tablet 1   venlafaxine  XR (EFFEXOR -XR) 37.5 MG 24 hr capsule Take 1 capsule (37.5 mg total) by mouth daily with breakfast. 90 capsule 1   No current facility-administered medications on file prior to visit.    Review of Systems:  As per HPI- otherwise negative.   Physical Examination: There were no vitals filed for this visit. There were no vitals filed for this visit. There is no height or weight on file to calculate BMI. Ideal Body Weight:    I spoke with patient's daughter today, patient herself is asleep.  She is 89 and her daughter did not want to wake her up  Assessment and Plan: COVID - Plan: molnupiravir  EUA (LAGEVRIO ) 200 mg  CAPS capsule, DISCONTINUED: nirmatrelvir /ritonavir , renal dosing, (PAXLOVID ) 10 x 150 MG & 10 x 100MG  TABS  Elderly lady with COVID-19 We had planned to use Paxlovid  for her.  However she is on Eliquis .  I discussed this with pharmacist at [facility] who felt the benefit of Paxlovid  was not worth  pausing her blood thinner.  I sent in a prescription of molnupiravir , however because this medication is expensive they may decide not to get it.  In this case I recommended supportive care and over-the-counter medications as needed.  If any distress they may elect to seek care at the ER  Signed Harlene Schroeder, MD

## 2024-01-02 NOTE — Transitions of Care (Post Inpatient/ED Visit) (Signed)
 01/02/2024  Name: Dana Burns MRN: [id number] DOB: 02-18-1926  Today's TOC FU Call Status: Today's TOC FU Call Status:: Successful TOC FU Call Completed TOC FU Call Complete Date: 01/02/24 Patient's Name and Date of Birth confirmed.  Transition Care Management Follow-up Telephone Call Date of Discharge: 01/01/24 Discharge Facility: [facility]) Type of Discharge: Emergency Department (patient left AMA) Reason for ED Visit: Other: (Covid + RSV) How have you been since you were released from the hospital?: Same Any questions or concerns?: No  Items Reviewed: Did you receive and understand the discharge instructions provided?: No (patient left before being seen) Medications obtained,verified, and reconciled?: Yes (Medications Reviewed) Any new allergies since your discharge?: No Dietary orders reviewed?: NA Do you have support at home?: Yes People in Home: child(ren), adult  Medications Reviewed Today: Medications Reviewed Today     Reviewed by Celines Femia, Marshall LABOR, CMA (Certified Medical Assistant) on 01/02/24 at 1146  Med List Status: <None>   Medication Order Taking? Sig Documenting Provider Last Dose Status Informant  acetaminophen  (TYLENOL ) 500 MG tablet [id number] No Take 2 tablets (1,000 mg total) by mouth every 8 (eight) hours. Rosario Leatrice FERNS, MD Taking Active   apixaban  (ELIQUIS ) 2.5 MG TABS tablet [id number]  Take 1 tablet (2.5 mg total) by mouth 2 (two) times daily. Revankar, Rajan R, MD  Active   Cholecalciferol (VITAMIN D3) 50 MCG (2000 UT) capsule [id number] No Take 2,000 Units by mouth daily.  [provider] Taking Active Child, Pharmacy Records  diazepam  (VALIUM ) 5 MG tablet [id number] No TAKE 1/2 TO 1 TABLET DAILY AS NEEDED FOR ANXIETY Domenica Harlene LABOR, MD Taking Active   furosemide  (LASIX ) 20 MG tablet [id number] No Take 1 tablet (20 mg total) by mouth daily. Take for 3 days, then switch to as needed use for swelling associated with weight gain of 3 pounds  in 24 hours or 6 pounds in a week. Let us  know if you are having to use frequently so we can monitor kidney function.  Patient taking differently: Take 20 mg by mouth daily as needed.   Rosario Leatrice FERNS, MD Taking Active            Med Note (TOUSEY, LAINE M   Fri Aug 09, 2023 11:43 AM) 08/09/23: Reports during TOC call has not needed recently   gabapentin  (NEURONTIN ) 800 MG tablet [id number] No Take 1 tablet (800 mg total) by mouth 3 (three) times daily.  Patient taking differently: Take 800 mg by mouth. 2 or 3 times a day   Domenica Harlene LABOR, MD Taking Active Child, Pharmacy Records           Med Note Beecher, [place] B   Wed May 15, 2023  1:25 PM)    metoprolol  tartrate (LOPRESSOR ) 25 MG tablet [id number] No Take 1 tablet twice daily. Please hold medication on the days systolic blood pressure (top number) is less than 90. Daneen Damien BROCKS, NP Taking Active Child, Pharmacy Records  mometasone -formoterol  (DULERA ) 200-5 MCG/ACT AERO [id number] No Inhale 2 puffs into the lungs 2 (two) times daily.  Patient taking differently: Inhale 2 puffs into the lungs as needed for wheezing or shortness of breath.   Cherlyn Labella, MD Taking Active Child, Pharmacy Records           Med Note (TOUSEY, LAINE M   Fri Aug 09, 2023 11:44 AM) 08/09/23: Reports during [facility] call has not needed recently  QUEtiapine  (SEROQUEL ) 25 MG tablet [id number] No TAKE 1 TABLET BY  MOUTH AT BEDTIME AS NEEDED. Onita Duos, MD Taking Active   rOPINIRole  (REQUIP ) 1 MG tablet [id number] No TAKE 1 TABLET BY MOUTH EVERYDAY AT BEDTIME Domenica Harlene LABOR, MD Taking Active   tiZANidine  (ZANAFLEX ) 4 MG tablet [id number]  TAKE 1 TABLET (4 MG TOTAL) BY MOUTH 2 (TWO) TIMES DAILY AS NEEDED FOR MUSCLE SPASMS. Domenica Harlene LABOR, MD  Active   venlafaxine  XR (EFFEXOR -XR) 37.5 MG 24 hr capsule [id number]  Take 1 capsule (37.5 mg total) by mouth daily with breakfast. Domenica Harlene LABOR, MD  Active             Home Care and Equipment/Supplies: Were Home Health Services  Ordered?: NA Any new equipment or medical supplies ordered?: NA  Functional Questionnaire: Do you need assistance with bathing/showering or dressing?: No Do you need assistance with meal preparation?: No Do you need assistance with eating?: No Do you have difficulty maintaining continence: No Do you need assistance with getting out of bed/getting out of a chair/moving?: No Do you have difficulty managing or taking your medications?: No  Follow up appointments reviewed: PCP Follow-up appointment confirmed?: Yes Date of PCP follow-up appointment?: 01/02/24 Follow-up Provider: [facility] Follow-up appointment confirmed?: NA Do you need transportation to your follow-up appointment?: No Do you understand care options if your condition(s) worsen?: Yes-patient verbalized understanding    Ollin Hochmuth, CMA  [facility] AWV Team Direct Dial: [phone]

## 2024-01-11 ENCOUNTER — Other Ambulatory Visit: Payer: Self-pay

## 2024-01-11 ENCOUNTER — Inpatient Hospital Stay (HOSPITAL_COMMUNITY)
Admission: EM | Admit: 2024-01-11 | Discharge: 2024-01-14 | DRG: 871 | Disposition: A | Discharge status: Hospice | Payer: Medicare Other | Attending: Internal Medicine | Admitting: Internal Medicine

## 2024-01-11 ENCOUNTER — Emergency Department (HOSPITAL_COMMUNITY): Payer: Medicare Other

## 2024-01-11 ENCOUNTER — Encounter (HOSPITAL_COMMUNITY): Payer: Self-pay | Admitting: Family Medicine

## 2024-01-11 DIAGNOSIS — L89626 Pressure-induced deep tissue damage of left heel: Secondary | ICD-10-CM | POA: Diagnosis not present

## 2024-01-11 DIAGNOSIS — F03B2 Unspecified dementia, moderate, with psychotic disturbance: Secondary | ICD-10-CM | POA: Diagnosis not present

## 2024-01-11 DIAGNOSIS — Z515 Encounter for palliative care: Secondary | ICD-10-CM

## 2024-01-11 DIAGNOSIS — J470 Bronchiectasis with acute lower respiratory infection: Secondary | ICD-10-CM | POA: Diagnosis present

## 2024-01-11 DIAGNOSIS — G9349 Other encephalopathy: Secondary | ICD-10-CM | POA: Diagnosis present

## 2024-01-11 DIAGNOSIS — M8580 Other specified disorders of bone density and structure, unspecified site: Secondary | ICD-10-CM | POA: Diagnosis present

## 2024-01-11 DIAGNOSIS — Z634 Disappearance and death of family member: Secondary | ICD-10-CM

## 2024-01-11 DIAGNOSIS — R451 Restlessness and agitation: Secondary | ICD-10-CM | POA: Diagnosis present

## 2024-01-11 DIAGNOSIS — J189 Pneumonia, unspecified organism: Secondary | ICD-10-CM | POA: Diagnosis present

## 2024-01-11 DIAGNOSIS — R001 Bradycardia, unspecified: Secondary | ICD-10-CM | POA: Diagnosis present

## 2024-01-11 DIAGNOSIS — F03B3 Unspecified dementia, moderate, with mood disturbance: Secondary | ICD-10-CM | POA: Diagnosis present

## 2024-01-11 DIAGNOSIS — I482 Chronic atrial fibrillation, unspecified: Secondary | ICD-10-CM

## 2024-01-11 DIAGNOSIS — K869 Disease of pancreas, unspecified: Secondary | ICD-10-CM

## 2024-01-11 DIAGNOSIS — N1832 Chronic kidney disease, stage 3b: Secondary | ICD-10-CM | POA: Diagnosis not present

## 2024-01-11 DIAGNOSIS — R55 Syncope and collapse: Secondary | ICD-10-CM

## 2024-01-11 DIAGNOSIS — Z888 Allergy status to other drugs, medicaments and biological substances status: Secondary | ICD-10-CM

## 2024-01-11 DIAGNOSIS — G9340 Encephalopathy, unspecified: Secondary | ICD-10-CM

## 2024-01-11 DIAGNOSIS — A419 Sepsis, unspecified organism: Secondary | ICD-10-CM | POA: Diagnosis not present

## 2024-01-11 DIAGNOSIS — E1122 Type 2 diabetes mellitus with diabetic chronic kidney disease: Secondary | ICD-10-CM | POA: Diagnosis present

## 2024-01-11 DIAGNOSIS — I959 Hypotension, unspecified: Secondary | ICD-10-CM | POA: Diagnosis present

## 2024-01-11 DIAGNOSIS — U071 COVID-19: Secondary | ICD-10-CM

## 2024-01-11 DIAGNOSIS — R64 Cachexia: Secondary | ICD-10-CM | POA: Diagnosis present

## 2024-01-11 DIAGNOSIS — Z681 Body mass index (BMI) 19 or less, adult: Secondary | ICD-10-CM | POA: Diagnosis not present

## 2024-01-11 DIAGNOSIS — Z0389 Encounter for observation for other suspected diseases and conditions ruled out: Secondary | ICD-10-CM | POA: Diagnosis not present

## 2024-01-11 DIAGNOSIS — Z66 Do not resuscitate: Secondary | ICD-10-CM | POA: Diagnosis not present

## 2024-01-11 DIAGNOSIS — Z880 Allergy status to penicillin: Secondary | ICD-10-CM

## 2024-01-11 DIAGNOSIS — F03B4 Unspecified dementia, moderate, with anxiety: Secondary | ICD-10-CM | POA: Diagnosis present

## 2024-01-11 DIAGNOSIS — L89311 Pressure ulcer of right buttock, stage 1: Secondary | ICD-10-CM | POA: Diagnosis present

## 2024-01-11 DIAGNOSIS — I700 Atherosclerosis of aorta: Secondary | ICD-10-CM | POA: Diagnosis not present

## 2024-01-11 DIAGNOSIS — I672 Cerebral atherosclerosis: Secondary | ICD-10-CM | POA: Diagnosis not present

## 2024-01-11 DIAGNOSIS — R627 Adult failure to thrive: Secondary | ICD-10-CM | POA: Diagnosis present

## 2024-01-11 DIAGNOSIS — I129 Hypertensive chronic kidney disease with stage 1 through stage 4 chronic kidney disease, or unspecified chronic kidney disease: Secondary | ICD-10-CM | POA: Diagnosis not present

## 2024-01-11 DIAGNOSIS — Z91048 Other nonmedicinal substance allergy status: Secondary | ICD-10-CM

## 2024-01-11 DIAGNOSIS — F418 Other specified anxiety disorders: Secondary | ICD-10-CM

## 2024-01-11 DIAGNOSIS — U099 Post covid-19 condition, unspecified: Secondary | ICD-10-CM | POA: Diagnosis not present

## 2024-01-11 DIAGNOSIS — E860 Dehydration: Secondary | ICD-10-CM | POA: Diagnosis not present

## 2024-01-11 DIAGNOSIS — R000 Tachycardia, unspecified: Secondary | ICD-10-CM | POA: Diagnosis not present

## 2024-01-11 DIAGNOSIS — L89321 Pressure ulcer of left buttock, stage 1: Secondary | ICD-10-CM | POA: Diagnosis present

## 2024-01-11 DIAGNOSIS — Z85038 Personal history of other malignant neoplasm of large intestine: Secondary | ICD-10-CM

## 2024-01-11 DIAGNOSIS — N179 Acute kidney failure, unspecified: Secondary | ICD-10-CM | POA: Diagnosis not present

## 2024-01-11 DIAGNOSIS — F32A Depression, unspecified: Secondary | ICD-10-CM | POA: Diagnosis present

## 2024-01-11 DIAGNOSIS — I6529 Occlusion and stenosis of unspecified carotid artery: Secondary | ICD-10-CM | POA: Diagnosis not present

## 2024-01-11 DIAGNOSIS — R739 Hyperglycemia, unspecified: Secondary | ICD-10-CM | POA: Diagnosis not present

## 2024-01-11 DIAGNOSIS — Z743 Need for continuous supervision: Secondary | ICD-10-CM | POA: Diagnosis not present

## 2024-01-11 DIAGNOSIS — J479 Bronchiectasis, uncomplicated: Secondary | ICD-10-CM | POA: Diagnosis not present

## 2024-01-11 DIAGNOSIS — R0902 Hypoxemia: Secondary | ICD-10-CM | POA: Diagnosis not present

## 2024-01-11 DIAGNOSIS — Z471 Aftercare following joint replacement surgery: Secondary | ICD-10-CM | POA: Diagnosis not present

## 2024-01-11 DIAGNOSIS — Z7401 Bed confinement status: Secondary | ICD-10-CM | POA: Diagnosis not present

## 2024-01-11 DIAGNOSIS — M797 Fibromyalgia: Secondary | ICD-10-CM | POA: Diagnosis present

## 2024-01-11 DIAGNOSIS — Z79899 Other long term (current) drug therapy: Secondary | ICD-10-CM

## 2024-01-11 DIAGNOSIS — R404 Transient alteration of awareness: Secondary | ICD-10-CM | POA: Diagnosis not present

## 2024-01-11 DIAGNOSIS — E782 Mixed hyperlipidemia: Secondary | ICD-10-CM | POA: Diagnosis present

## 2024-01-11 DIAGNOSIS — Z8261 Family history of arthritis: Secondary | ICD-10-CM

## 2024-01-11 DIAGNOSIS — E872 Acidosis, unspecified: Secondary | ICD-10-CM | POA: Diagnosis present

## 2024-01-11 DIAGNOSIS — Z825 Family history of asthma and other chronic lower respiratory diseases: Secondary | ICD-10-CM

## 2024-01-11 DIAGNOSIS — Z7901 Long term (current) use of anticoagulants: Secondary | ICD-10-CM

## 2024-01-11 DIAGNOSIS — Z800 Family history of malignant neoplasm of digestive organs: Secondary | ICD-10-CM

## 2024-01-11 DIAGNOSIS — G894 Chronic pain syndrome: Secondary | ICD-10-CM | POA: Diagnosis present

## 2024-01-11 DIAGNOSIS — E1142 Type 2 diabetes mellitus with diabetic polyneuropathy: Secondary | ICD-10-CM | POA: Diagnosis not present

## 2024-01-11 DIAGNOSIS — Z881 Allergy status to other antibiotic agents status: Secondary | ICD-10-CM

## 2024-01-11 DIAGNOSIS — Z7951 Long term (current) use of inhaled steroids: Secondary | ICD-10-CM

## 2024-01-11 DIAGNOSIS — Z7189 Other specified counseling: Secondary | ICD-10-CM | POA: Diagnosis not present

## 2024-01-11 DIAGNOSIS — R0682 Tachypnea, not elsewhere classified: Secondary | ICD-10-CM | POA: Diagnosis present

## 2024-01-11 LAB — PROTIME-INR
INR: 1.7 — ABNORMAL HIGH (ref 0.8–1.2)
Prothrombin Time: 20.5 s — ABNORMAL HIGH (ref 11.4–15.2)

## 2024-01-11 LAB — CBC WITH DIFFERENTIAL/PLATELET
Abs Immature Granulocytes: 0.05 10*3/uL (ref 0.00–0.07)
Basophils Absolute: 0 10*3/uL (ref 0.0–0.1)
Basophils Relative: 0 %
Eosinophils Absolute: 0 10*3/uL (ref 0.0–0.5)
Eosinophils Relative: 0 %
HCT: 55.6 % — ABNORMAL HIGH (ref 36.0–46.0)
Hemoglobin: 17.3 g/dL — ABNORMAL HIGH (ref 12.0–15.0)
Immature Granulocytes: 0 %
Lymphocytes Relative: 23 %
Lymphs Abs: 2.6 10*3/uL (ref 0.7–4.0)
MCH: 28.7 pg (ref 26.0–34.0)
MCHC: 31.1 g/dL (ref 30.0–36.0)
MCV: 92.4 fL (ref 80.0–100.0)
Monocytes Absolute: 0.9 10*3/uL (ref 0.1–1.0)
Monocytes Relative: 8 %
Neutro Abs: 8 10*3/uL — ABNORMAL HIGH (ref 1.7–7.7)
Neutrophils Relative %: 69 %
Platelets: 281 10*3/uL (ref 150–400)
RBC: 6.02 MIL/uL — ABNORMAL HIGH (ref 3.87–5.11)
RDW: 14.6 % (ref 11.5–15.5)
WBC: 11.6 10*3/uL — ABNORMAL HIGH (ref 4.0–10.5)
nRBC: 0 % (ref 0.0–0.2)

## 2024-01-11 LAB — I-STAT CHEM 8, ED
BUN: 127 mg/dL — ABNORMAL HIGH (ref 8–23)
Calcium, Ion: 1.02 mmol/L — ABNORMAL LOW (ref 1.15–1.40)
Chloride: 125 mmol/L — ABNORMAL HIGH (ref 98–111)
Creatinine, Ser: 1.9 mg/dL — ABNORMAL HIGH (ref 0.44–1.00)
Glucose, Bld: 231 mg/dL — ABNORMAL HIGH (ref 70–99)
HCT: 53 % — ABNORMAL HIGH (ref 36.0–46.0)
Hemoglobin: 18 g/dL — ABNORMAL HIGH (ref 12.0–15.0)
Potassium: 5.1 mmol/L (ref 3.5–5.1)
Sodium: 140 mmol/L (ref 135–145)
TCO2: 18 mmol/L — ABNORMAL LOW (ref 22–32)

## 2024-01-11 LAB — APTT: aPTT: 23 s — ABNORMAL LOW (ref 24–36)

## 2024-01-11 LAB — COMPREHENSIVE METABOLIC PANEL
ALT: 18 U/L (ref 0–44)
AST: 42 U/L — ABNORMAL HIGH (ref 15–41)
Albumin: 3.1 g/dL — ABNORMAL LOW (ref 3.5–5.0)
Alkaline Phosphatase: 51 U/L (ref 38–126)
Anion gap: 20 — ABNORMAL HIGH (ref 5–15)
BUN: 119 mg/dL — ABNORMAL HIGH (ref 8–23)
CO2: 15 mmol/L — ABNORMAL LOW (ref 22–32)
Calcium: 9.8 mg/dL (ref 8.9–10.3)
Chloride: 111 mmol/L (ref 98–111)
Creatinine, Ser: 2.02 mg/dL — ABNORMAL HIGH (ref 0.44–1.00)
GFR, Estimated: 22 mL/min — ABNORMAL LOW (ref >60)
Glucose, Bld: 263 mg/dL — ABNORMAL HIGH (ref 70–99)
Potassium: 3.9 mmol/L (ref 3.5–5.1)
Sodium: 146 mmol/L — ABNORMAL HIGH (ref 135–145)
Total Bilirubin: 1 mg/dL (ref 0.0–1.2)
Total Protein: 6.6 g/dL (ref 6.5–8.1)

## 2024-01-11 LAB — URINALYSIS, W/ REFLEX TO CULTURE (INFECTION SUSPECTED)
Bilirubin Urine: NEGATIVE
Glucose, UA: NEGATIVE mg/dL
Hgb urine dipstick: NEGATIVE
Ketones, ur: NEGATIVE mg/dL
Leukocytes,Ua: NEGATIVE
Nitrite: NEGATIVE
Protein, ur: 100 mg/dL — AB
Specific Gravity, Urine: 1.023 (ref 1.005–1.030)
pH: 5 (ref 5.0–8.0)

## 2024-01-11 LAB — CBG MONITORING, ED
Glucose-Capillary: 270 mg/dL — ABNORMAL HIGH (ref 70–99)
Glucose-Capillary: 194 mg/dL — ABNORMAL HIGH (ref 70–99)

## 2024-01-11 LAB — I-STAT CG4 LACTIC ACID, ED: Lactic Acid, Venous: 5.8 mmol/L (ref 0.5–1.9)

## 2024-01-11 LAB — RESP PANEL BY RT-PCR (RSV, FLU A&B, COVID)  RVPGX2
Influenza A by PCR: NEGATIVE
Influenza B by PCR: NEGATIVE
Resp Syncytial Virus by PCR: NEGATIVE
SARS Coronavirus 2 by RT PCR: POSITIVE — AB

## 2024-01-11 MED ORDER — STERILE WATER FOR INJECTION IV SOLN
INTRAVENOUS | Status: DC
  Filled 2024-01-11 (×2): qty 1000

## 2024-01-11 MED ORDER — METOPROLOL TARTRATE 25 MG PO TABS: 25.0000 mg | ORAL_TABLET | Freq: Two times a day (BID) | ORAL | Status: DC

## 2024-01-11 MED ORDER — INSULIN ASPART 100 UNIT/ML IJ SOLN: 0.0000 [IU] | Freq: Every day | INTRAMUSCULAR | Status: DC

## 2024-01-11 MED ORDER — PROCHLORPERAZINE EDISYLATE 10 MG/2ML IJ SOLN: 5.0000 mg | Freq: Four times a day (QID) | INTRAMUSCULAR | Status: DC | PRN

## 2024-01-11 MED ORDER — INSULIN ASPART 100 UNIT/ML IJ SOLN
0.0000 [IU] | Freq: Three times a day (TID) | INTRAMUSCULAR | Status: DC
  Administered 2024-01-12: 1 [IU] via SUBCUTANEOUS

## 2024-01-11 MED ORDER — SODIUM CHLORIDE 0.9 % IV SOLN
2.0000 g | Freq: Once | INTRAVENOUS | Status: AC
  Administered 2024-01-11: 2 g via INTRAVENOUS
  Filled 2024-01-11: qty 20

## 2024-01-11 MED ORDER — SODIUM CHLORIDE 0.9 % IV BOLUS (SEPSIS)
500.0000 mL | Freq: Once | INTRAVENOUS | Status: AC
Start: 2024-01-11 — End: 2024-01-11
  Administered 2024-01-11: 500 mL via INTRAVENOUS

## 2024-01-11 MED ORDER — IOHEXOL 350 MG/ML SOLN
75.0000 mL | Freq: Once | INTRAVENOUS | Status: AC | PRN
  Administered 2024-01-11: 75 mL via INTRAVENOUS

## 2024-01-11 MED ORDER — METOPROLOL TARTRATE 5 MG/5ML IV SOLN: 5.0000 mg | Freq: Once | INTRAVENOUS | Status: DC

## 2024-01-11 MED ORDER — APIXABAN 2.5 MG PO TABS
2.5000 mg | ORAL_TABLET | Freq: Two times a day (BID) | ORAL | Status: DC
  Administered 2024-01-11 – 2024-01-13 (×4): 2.5 mg via ORAL
  Filled 2024-01-11 (×4): qty 1

## 2024-01-11 MED ORDER — IPRATROPIUM-ALBUTEROL 0.5-2.5 (3) MG/3ML IN SOLN: 3.0000 mL | RESPIRATORY_TRACT | Status: DC | PRN

## 2024-01-11 MED ORDER — METOPROLOL TARTRATE 5 MG/5ML IV SOLN
2.5000 mg | INTRAVENOUS | Status: DC | PRN
Start: 2024-01-11 — End: 2024-01-11

## 2024-01-11 MED ORDER — SODIUM CHLORIDE 0.9 % IV BOLUS (SEPSIS)
250.0000 mL | Freq: Once | INTRAVENOUS | Status: AC
  Administered 2024-01-11: 250 mL via INTRAVENOUS

## 2024-01-11 MED ORDER — LACTATED RINGERS IV SOLN: INTRAVENOUS | Status: DC

## 2024-01-11 MED ORDER — SODIUM CHLORIDE 0.9 % IV BOLUS (SEPSIS): 1000.0000 mL | Freq: Once | INTRAVENOUS | Status: DC

## 2024-01-11 MED ORDER — LEVOFLOXACIN IN D5W 750 MG/150ML IV SOLN: 750.0000 mg | Freq: Once | INTRAVENOUS | Status: DC

## 2024-01-11 MED ORDER — DOXYCYCLINE HYCLATE 100 MG IV SOLR: 100.0000 mg | Freq: Two times a day (BID) | INTRAVENOUS | Status: DC

## 2024-01-11 MED ORDER — ACETAMINOPHEN 325 MG PO TABS
650.0000 mg | ORAL_TABLET | Freq: Four times a day (QID) | ORAL | Status: DC | PRN
  Administered 2024-01-12: 650 mg via ORAL
  Filled 2024-01-11: qty 2

## 2024-01-11 MED ORDER — SODIUM CHLORIDE 0.9 % IV SOLN
500.0000 mg | Freq: Once | INTRAVENOUS | Status: AC
  Administered 2024-01-11: 500 mg via INTRAVENOUS
  Filled 2024-01-11: qty 5

## 2024-01-11 MED ORDER — SODIUM CHLORIDE 0.9 % IV SOLN: 2.0000 g | INTRAVENOUS | Status: DC

## 2024-01-11 MED ORDER — SODIUM CHLORIDE 0.9% FLUSH
3.0000 mL | Freq: Two times a day (BID) | INTRAVENOUS | Status: DC
  Administered 2024-01-11 – 2024-01-12 (×2): 3 mL via INTRAVENOUS

## 2024-01-11 MED ORDER — ACETAMINOPHEN 650 MG RE SUPP: 650.0000 mg | Freq: Four times a day (QID) | RECTAL | Status: DC | PRN

## 2024-01-11 MED ORDER — METOPROLOL TARTRATE 5 MG/5ML IV SOLN
2.5000 mg | INTRAVENOUS | Status: DC | PRN
  Administered 2024-01-12 (×2): 2.5 mg via INTRAVENOUS
  Filled 2024-01-11 (×2): qty 5

## 2024-01-11 MED ORDER — METOPROLOL TARTRATE 25 MG PO TABS
25.0000 mg | ORAL_TABLET | Freq: Two times a day (BID) | ORAL | Status: DC
  Administered 2024-01-11 – 2024-01-12 (×2): 25 mg via ORAL
  Filled 2024-01-11 (×2): qty 1

## 2024-01-11 NOTE — Progress Notes (Signed)
 Elink following for sepsis protocol.

## 2024-01-11 NOTE — ED Provider Notes (Signed)
 [facility] Provider Note   CSN: [id number] Arrival date & time: 01/11/24  1553     History  Chief Complaint  Patient presents with   Loss of Consciousness    Found by daughter, unwitnessed   *THIS PATIENT IS DNR/DNI CODE STATUS CONFIRMED WITH FAMILY ON Jan 11, 2024*  Dana Burns is a 89 y.o. female.  With a history of atrial fibrillation on Eliquis , hypertension diabetes and dementia who presents to the ED after an unresponsive episode at home.  Daughter at bedside states patient has been recovering from acute illness she was recently diagnosed with COVID in late December.  She has been very weak and not eating much at home.  Today at 1500 after using the restroom and ambulating with her daughter she became unresponsive.  Daughter lowered her down to the floor.  Daughter feared that the patient did not have a pulse and attempted to administer chest compressions after calling 911.  EMS arrived to find the patient unresponsive and initiated bag valve mask ventilation.  Patient was noted to be significantly tachycardic and was given 150 mg amiodarone from EMS.  Systolic blood pressure within normal limits with EMS.  Patient arrives tachypneic and remains altered and weak.  She is able to recognize her family members.  Unable to obtain additional history at this time secondary to clinical condition   Loss of Consciousness      Home Medications Prior to Admission medications   Medication Sig Start Date End Date Taking? Authorizing Provider  acetaminophen  (TYLENOL ) 500 MG tablet Take 2 tablets (1,000 mg total) by mouth every 8 (eight) hours. 03/28/23   Rosario Leatrice FERNS, MD  apixaban  (ELIQUIS ) 2.5 MG TABS tablet Take 1 tablet (2.5 mg total) by mouth 2 (two) times daily. 12/11/23   Revankar, Rajan R, MD  Cholecalciferol (VITAMIN D3) 50 MCG (2000 UT) capsule Take 2,000 Units by mouth daily.     [provider]  diazepam  (VALIUM ) 5 MG  tablet TAKE 1/2 TO 1 TABLET DAILY AS NEEDED FOR ANXIETY 08/30/23   Domenica Harlene LABOR, MD  furosemide  (LASIX ) 20 MG tablet Take 1 tablet (20 mg total) by mouth daily. Take for 3 days, then switch to as needed use for swelling associated with weight gain of 3 pounds in 24 hours or 6 pounds in a week. Let us  know if you are having to use frequently so we can monitor kidney function. Patient taking differently: Take 20 mg by mouth daily as needed. 03/28/23 08/18/24  Rosario Leatrice FERNS, MD  gabapentin  (NEURONTIN ) 800 MG tablet Take 1 tablet (800 mg total) by mouth 3 (three) times daily. Patient taking differently: Take 800 mg by mouth. 2 or 3 times a day 11/20/22   Domenica Harlene LABOR, MD  metoprolol  tartrate (LOPRESSOR ) 25 MG tablet Take 1 tablet twice daily. Please hold medication on the days systolic blood pressure (top number) is less than 90. 03/21/23   Monge, Damien BROCKS, NP  mometasone -formoterol  (DULERA ) 200-5 MCG/ACT AERO Inhale 2 puffs into the lungs 2 (two) times daily. Patient taking differently: Inhale 2 puffs into the lungs as needed for wheezing or shortness of breath. 01/15/23   Cherlyn Labella, MD  QUEtiapine  (SEROQUEL ) 25 MG tablet TAKE 1 TABLET BY MOUTH AT BEDTIME AS NEEDED. 08/29/23   Onita Duos, MD  rOPINIRole  (REQUIP ) 1 MG tablet TAKE 1 TABLET BY MOUTH EVERYDAY AT BEDTIME 10/11/23   Domenica Harlene LABOR, MD  tiZANidine  (ZANAFLEX ) 4 MG  tablet TAKE 1 TABLET (4 MG TOTAL) BY MOUTH 2 (TWO) TIMES DAILY AS NEEDED FOR MUSCLE SPASMS. 11/01/23   Domenica Harlene LABOR, MD  venlafaxine  XR (EFFEXOR -XR) 37.5 MG 24 hr capsule Take 1 capsule (37.5 mg total) by mouth daily with breakfast. 11/18/23   Domenica Harlene LABOR, MD      Allergies    Tape, Actos [pioglitazone hydrochloride], Buspar [buspirone hcl], Doxycycline , Lipitor [atorvastatin calcium], Penicillins, and Seroquel  [quetiapine ]    Review of Systems   Review of Systems  Cardiovascular:  Positive for syncope.    Physical Exam Updated Vital Signs BP 139/65   Pulse  (!) 53   Temp 98.7 F (37.1 C) (Rectal)   Resp (!) 24   SpO2 95%  Physical Exam Vitals and nursing note reviewed.  HENT:     Head: Normocephalic and atraumatic.  Eyes:     Extraocular Movements: Extraocular movements intact.     Pupils: Pupils are equal, round, and reactive to light.  Cardiovascular:     Rate and Rhythm: Tachycardia present. Rhythm irregular.  Pulmonary:     Breath sounds: Normal breath sounds.     Comments: Tachypneic Abdominal:     Palpations: Abdomen is soft.     Tenderness: There is no abdominal tenderness.  Skin:    General: Skin is warm and dry.  Neurological:     Mental Status: She is alert.     Comments: Able to answer yes no Not following commands  Psychiatric:        Mood and Affect: Mood normal.     ED Results / Procedures / Treatments   Labs (all labs ordered are listed, but only abnormal results are displayed) Labs Reviewed  RESP PANEL BY RT-PCR (RSV, FLU A&B, COVID)  RVPGX2 - Abnormal; Notable for the following components:      Result Value   SARS Coronavirus 2 by RT PCR POSITIVE (*)    All other components within normal limits  COMPREHENSIVE METABOLIC PANEL - Abnormal; Notable for the following components:   Sodium 146 (*)    CO2 15 (*)    Glucose, Bld 263 (*)    BUN 119 (*)    Creatinine, Ser 2.02 (*)    Albumin 3.1 (*)    AST 42 (*)    GFR, Estimated 22 (*)    Anion gap 20 (*)    All other components within normal limits  CBC WITH DIFFERENTIAL/PLATELET - Abnormal; Notable for the following components:   WBC 11.6 (*)    RBC 6.02 (*)    Hemoglobin 17.3 (*)    HCT 55.6 (*)    Neutro Abs 8.0 (*)    All other components within normal limits  PROTIME-INR - Abnormal; Notable for the following components:   Prothrombin Time 20.5 (*)    INR 1.7 (*)    All other components within normal limits  APTT - Abnormal; Notable for the following components:   aPTT 23 (*)    All other components within normal limits  URINALYSIS, W/ REFLEX  TO CULTURE (INFECTION SUSPECTED) - Abnormal; Notable for the following components:   APPearance HAZY (*)    Protein, ur 100 (*)    Bacteria, UA RARE (*)    All other components within normal limits  CBG MONITORING, ED - Abnormal; Notable for the following components:   Glucose-Capillary 270 (*)    All other components within normal limits  I-STAT CG4 LACTIC ACID, ED - Abnormal; Notable for the following components:  Lactic Acid, Venous 5.8 (*)    All other components within normal limits  I-STAT CHEM 8, ED - Abnormal; Notable for the following components:   Chloride 125 (*)    BUN 127 (*)    Creatinine, Ser 1.90 (*)    Glucose, Bld 231 (*)    Calcium, Ion 1.02 (*)    TCO2 18 (*)    Hemoglobin 18.0 (*)    HCT 53.0 (*)    All other components within normal limits  CULTURE, BLOOD (ROUTINE X 2)  CULTURE, BLOOD (ROUTINE X 2)  EXPECTORATED SPUTUM ASSESSMENT W GRAM STAIN, RFLX TO RESP C  BASIC METABOLIC PANEL  HEPATIC FUNCTION PANEL  MAGNESIUM   PHOSPHORUS  CBC  LEGIONELLA PNEUMOPHILA SEROGP 1 UR AG  STREP PNEUMONIAE URINARY ANTIGEN  PROCALCITONIN  PROCALCITONIN  I-STAT CG4 LACTIC ACID, ED    EKG None  Radiology CT ANGIO HEAD NECK W WO CM Result Date: 01/11/2024 CLINICAL DATA:  Found unresponsive EXAM: CT ANGIOGRAPHY HEAD AND NECK WITH AND WITHOUT CONTRAST TECHNIQUE: Multidetector CT imaging of the head and neck was performed using the standard protocol during bolus administration of intravenous contrast. Multiplanar CT image reconstructions and MIPs were obtained to evaluate the vascular anatomy. Carotid stenosis measurements (when applicable) are obtained utilizing NASCET criteria, using the distal internal carotid diameter as the denominator. RADIATION DOSE REDUCTION: This exam was performed according to the departmental dose-optimization program which includes automated exposure control, adjustment of the mA and/or kV according to patient size and/or use of iterative  reconstruction technique. CONTRAST:  75mL OMNIPAQUE  IOHEXOL  350 MG/ML SOLN COMPARISON:  No prior CTA available, correlation is made with 08/05/2023 CT head FINDINGS: CT HEAD FINDINGS Brain: No evidence of acute infarct, hemorrhage, mass, mass effect, or midline shift. No hydrocephalus or extra-axial fluid collection. Age related cerebral atrophy. Vascular: No hyperdense vessel. Skull: Negative for fracture or focal lesion. Sinuses/Orbits: Mucosal thickening in the maxillary sinuses and ethmoid air cells. Status post bilateral lens replacements. Other: The mastoid air cells are well aerated. CTA NECK FINDINGS Aortic arch: Standard branching. Imaged portion shows no evidence of aneurysm or dissection. No significant stenosis of the major arch vessel origins. Aortic atherosclerosis. Right carotid system: No evidence of dissection, occlusion, or hemodynamically significant stenosis (greater than 50%). Left carotid system: No evidence of dissection, occlusion, or hemodynamically significant stenosis (greater than 50%). Vertebral arteries: No evidence of dissection, occlusion, or hemodynamically significant stenosis (greater than 50%). Skeleton: No acute osseous abnormality. Degenerative changes in the cervical spine. Other neck: No acute finding. Upper chest: No focal pulmonary opacity or pleural effusion. Review of the MIP images confirms the above findings CTA HEAD FINDINGS Anterior circulation: Both internal carotid arteries are patent to the termini, without significant stenosis. A1 segments patent. Normal anterior communicating artery. Anterior cerebral arteries are patent to their distal aspects without significant stenosis. No M1 stenosis or occlusion. MCA branches perfused to their distal aspects without significant stenosis. Posterior circulation: Vertebral arteries patent to the vertebrobasilar junction without significant stenosis. Posterior inferior cerebellar arteries patent proximally. Basilar patent to its  distal aspect without significant stenosis. Redemonstration in the proximal basilar. Superior cerebellar arteries patent proximally. Patent left P1, with mild stenosis proximally (series 9, image 92). Aplastic right P1. Fetal origin of the right PCA from the right posterior communicating artery, with severe stenosis in the distal right posterior communicating artery (series 9, image 95). Severe stenosis in the proximal right P2 (series 9, image 95) and mild stenosis in the distal right P2 (  series 9, image 96). Moderate stenosis in the proximal left P (series 9, images 95-96). Mild-to-moderate stenosis in the distal left P2 (series 9, image 98). Venous sinuses: As permitted by contrast timing, patent. Anatomic variants: Fetal origin of the right PCA. No evidence of aneurysm or vascular malformation. Review of the MIP images confirms the above findings IMPRESSION: 1. No acute intracranial process. 2. No intracranial large vessel occlusion. Severe stenosis in the distal right posterior communicating artery and proximal right P2, and moderate stenosis in the proximal left P2. 3. No hemodynamically significant stenosis in the neck. 4. Aortic atherosclerosis. Aortic Atherosclerosis (ICD10-I70.0). Electronically Signed   By: Donald Campion M.D.   On: 01/11/2024 19:54   CT Chest W Contrast Result Date: 01/11/2024 CLINICAL DATA:  Sepsis, patient found unresponsive. EXAM: CT CHEST WITH CONTRAST TECHNIQUE: Multidetector CT imaging of the chest was performed during intravenous contrast administration. RADIATION DOSE REDUCTION: This exam was performed according to the departmental dose-optimization program which includes automated exposure control, adjustment of the mA and/or kV according to patient size and/or use of iterative reconstruction technique. CONTRAST:  75mL OMNIPAQUE  IOHEXOL  350 MG/ML SOLN COMPARISON:  Chest CT 01/08/2023 and chest radiograph 01/11/2024 FINDINGS: Cardiovascular: Coronary, aortic arch, and branch  vessel atherosclerotic vascular disease. Right atrial enlargement. Mediastinum/Nodes: Small type 1 hiatal hernia. Lungs/Pleura: Biapical pleuroparenchymal scarring. Faint tree-in-bud nodularity posteriorly in the right upper lobe on image 72 series 9. This is probably from atypical infectious bronchiolitis and is minimal. Tree-in-bud nodularity and associated cylindrical bronchiectasis anteromedially in the left upper lobe with some associated airway plugging in this vicinity and in the lingula. Left lower lobe cylindrical bronchiectasis with scattered plugging and mild inflammatory nodularity is observed. This likely represents atypical infectious bronchiolitis and is mildly increased in the left lower lobe compared to last year. Upper Abdomen: Left renal atrophy. Small bilateral renal cysts. No further imaging workup of these lesions is indicated. Several small cystic lesions of the pancreas are observed, the largest in the tail the pancreas measures 1.1 by 1.1 by 1.1 cm on image 175 series 17. Possibilities include postinflammatory cystic lesion or intraductal papillary mucinous neoplasm. The prior CT chest did not extend so far down into the abdomen as to include this previously. Musculoskeletal: Degenerative sternoclavicular arthropathy, right greater than left. Thoracic kyphosis. IMPRESSION: 1. Tree-in-bud nodularity and associated cylindrical bronchiectasis in the left upper lobe, lingula, and left lower lobe, with some associated airway plugging. This likely represents atypical infectious bronchiolitis and is mildly increased in the left lower lobe compared to last year. 2. Coronary, aortic arch, and branch vessel atherosclerotic vascular disease. 3. Right atrial enlargement. 4. Small type 1 hiatal hernia. 5. Left renal atrophy. 6. Several small cystic lesions of the pancreas, the largest in the tail the pancreas measures 1.1 by 1.1 by 1.1 cm. Possibilities include postinflammatory cystic lesion or  intraductal papillary mucinous neoplasm. Follow up pancreatic protocol CT or MRI is recommended in 2 years time. This recommendation follows ACR consensus guidelines: Management of Incidental Pancreatic Cysts: A White Paper of the ACR Incidental Findings Committee. J Am Coll Radiol 2017;14:911-923. 7. Degenerative sternoclavicular arthropathy, right greater than left. 8. Thoracic kyphosis. 9. Aortic atherosclerosis. Aortic Atherosclerosis (ICD10-I70.0). Electronically Signed   By: Ryan Salvage M.D.   On: 01/11/2024 19:34   DG Chest Port 1 View Result Date: 01/11/2024 CLINICAL DATA:  Questionable sepsis. EXAM: PORTABLE CHEST 1 VIEW COMPARISON:  None Available. FINDINGS: Normal mediastinum and cardiac silhouette. Lungs are hyperinflated. Normal pulmonary vasculature. No evidence  of effusion, infiltrate, or pneumothorax. No acute bony abnormality. IMPRESSION: 1. No acute cardiopulmonary process. 2. Hyperinflation. Electronically Signed   By: Jackquline Boxer M.D.   On: 01/11/2024 16:41    Procedures .Critical Care  Performed by: Pamella Ozell LABOR, DO Authorized by: Pamella Ozell LABOR, DO   Critical care provider statement:    Critical care time (minutes):  30   Critical care time was exclusive of:  Separately billable procedures and treating other patients and teaching time   Critical care was necessary to treat or prevent imminent or life-threatening deterioration of the following conditions:  Sepsis   Critical care was time spent personally by me on the following activities:  Development of treatment plan with patient or surrogate, discussions with consultants, evaluation of patient's response to treatment, examination of patient, ordering and review of laboratory studies, ordering and review of radiographic studies, ordering and performing treatments and interventions, pulse oximetry, re-evaluation of patient's condition and review of old charts   I assumed direction of critical care for this  patient from another provider in my specialty: no     Care discussed with: admitting provider       Medications Ordered in ED Medications  sodium chloride  0.9 % bolus 1,000 mL ( Intravenous Restarted 01/11/24 1925)    And  sodium chloride  0.9 % bolus 500 mL (has no administration in time range)    And  sodium chloride  0.9 % bolus 250 mL (250 mLs Intravenous New Bag/Given 01/11/24 1923)  cefTRIAXone  (ROCEPHIN ) 2 g in sodium chloride  0.9 % 100 mL IVPB (2 g Intravenous New Bag/Given 01/11/24 1854)    Followed by  azithromycin  (ZITHROMAX ) 500 mg in sodium chloride  0.9 % 250 mL IVPB (has no administration in time range)  apixaban  (ELIQUIS ) tablet 2.5 mg (has no administration in time range)  ipratropium-albuterol  (DUONEB) 0.5-2.5 (3) MG/3ML nebulizer solution 3 mL (has no administration in time range)  insulin  aspart (novoLOG ) injection 0-6 Units (has no administration in time range)  insulin  aspart (novoLOG ) injection 0-5 Units (has no administration in time range)  sodium bicarbonate 150 mEq in sterile water  1,150 mL infusion (has no administration in time range)  sodium chloride  flush (NS) 0.9 % injection 3 mL (has no administration in time range)  acetaminophen  (TYLENOL ) tablet 650 mg (has no administration in time range)    Or  acetaminophen  (TYLENOL ) suppository 650 mg (has no administration in time range)  prochlorperazine  (COMPAZINE ) injection 5 mg (has no administration in time range)  cefTRIAXone  (ROCEPHIN ) 2 g in sodium chloride  0.9 % 100 mL IVPB (has no administration in time range)  doxycycline  (VIBRAMYCIN ) 100 mg in dextrose  5 % 250 mL IVPB (has no administration in time range)  metoprolol  tartrate (LOPRESSOR ) tablet 25 mg (has no administration in time range)  iohexol  (OMNIPAQUE ) 350 MG/ML injection 75 mL (75 mLs Intravenous Contrast Given 01/11/24 1640)    ED Course/ Medical Decision Making/ A&P Clinical Course as of 01/11/24 2056  Sat Jan 11, 2024  2054 Laboratory workup  notable for positive COVID test.  AKI most likely in the setting of dehydration and poor p.o. intake.  No UTI pneumonia or acute stroke.  Patient remains altered from baseline.  She will be admitted to medicine.  Discussed with Dr.opyd from hospitalist service who will admit patient. [MP]    Clinical Course User Index [MP] Pamella Ozell LABOR, DO  Medical Decision Making 89 year old female with history as above including recent acute COVID infection presenting after unresponsive episode at home.  Unresponsive episode while ambulating to the bathroom with her daughter.  Has been very weak with poor p.o. intake in the setting of recovering from acute COVID infection.  Daughter felt her to be without a pulse and started chest compressions.  EMS did appreciate a pulse stating the patient was tachycardic and initiated BVM ventilation and gave 150 mg amiodarone prior to arrival.  Upon arrival her blood pressure is within normal limits she remains tachypneic and tachycardic in atrial fibrillation.  Unable to follow commands but can recognize family members.  Suspect most likely etiology of today's episode would be severe dehydration in setting of poor p.o. intake versus acute infectious process.  She met SIRS criteria and code sepsis was activated.  We will obtain laboratory workup.  We also obtain a CT a of the head and neck to evaluate for acute stroke given the sudden onset at 1500 although an underlying metabolic causes much more likely.  Will initiate fluid resuscitation and broad-spectrum antibiotics.  Confirmed with the patient's daughter that she is DNR/DNI CODE STATUS in her chart has been updated.  Planning for admission  Amount and/or Complexity of Data Reviewed Labs: ordered. Radiology: ordered.  Risk Prescription drug management. Decision regarding hospitalization.           Final Clinical Impression(s) / ED Diagnoses Final diagnoses:  AKI (acute kidney  injury) (HCC)  Dehydration  Syncope, unspecified syncope type  COVID    Rx / DC Orders ED Discharge Orders     None         Pamella Ozell LABOR, DO 01/11/24 2056

## 2024-01-11 NOTE — Progress Notes (Signed)
 IV team at bedside

## 2024-01-11 NOTE — ED Notes (Addendum)
 Assumed care of pt, found her and two RNs in CT.  Pt crying out not wanting to be moved.  After CT pt brought back to room and new warm blankets reapplied and pt made comfortable.  The fluids would not go in due to the pt bending her arm.  Family educated on the use of one soft restraint and they agreed to allow.  Soft restraint placed on left wrist, pt did not seem to notice.    Pt needed in-out-cath which was done by RN Delon and Stuart NT.  Pt was placed on clean sheets and given warm blankets.  At that time the COVID result came back positive.  Female visitor said we all had it this past week.  RN requested that the pt's family member wear a mask.  He jumped up and yelled You can't make me wear a mask and you can't make me leave.  Pt continued to yell.  Get out of the room, your fired...  RN left the room and informed Charge RN and [employer] who indicated that family did not have to wear a mask in the room but must in the hall.  RN went back in w/ another RN and explained that no offence was ment but clarified that masks would need to be worn in the halls.  He again jumped up and yelled I told you that I'm not wearing a mask and you are fired.  RN left the room.

## 2024-01-11 NOTE — H&P (Signed)
 History and Physical    Dana Burns FMW:[id number] DOB: June 04, 1926 DOA: 01/11/2024  PCP: Domenica Harlene LABOR, MD   Patient coming from: Home   Chief Complaint: Dana Burns   HPI: Dana Burns is a 89 y.o. female with medical history significant for depression, anxiety, type 2 diabetes mellitus, hypertension, hyperlipidemia, atrial fibrillation on Eliquis , CKD 3B, and dementia who presents after being found unresponsive by family.  Patient developed fatigue, mild cough, and general malaise almost a month ago, her daughter also became ill, and the patient tested positive for COVID-19 on 01/01/2024.  Since then, her cough has resolved but patient has lost her appetite and has become progressively more fatigued.  She has not eaten in a few days now.  She denied abdominal pain, has not been vomiting, but does not want to eat.  She does not have trouble swallowing and has been drinking some water  at home without incident.  Patient called out for help to the bathroom today but when her daughter was attempting to assist her back to bed, patient slumped to the ground, was pale, and her daughter was not sure if she was breathing.  EMS was called and the patient was transported to the ED.  ED Course: Upon arrival to the ED, patient is found to be afebrile and saturating mid 80s to mid 90s on room air with mild tachypnea, bradycardia, and stable blood pressure.  EKG demonstrates atrial fibrillation with RVR, no acute findings are noted on chest x-ray, head CT is negative for acute abnormality, and CT chest demonstrates findings suggestive of atypical infection and are similar to CT 1 year ago.  Labs are most notable for glucose 263, bicarbonate 15, BUN 119, creatinine 2.02, anion gap 20, lactic acid 5.8, WBC 11,600, and hemoglobin 17.3.  Blood cultures were collected and the patient was given 1.75 L NS, Rocephin , and azithromycin .  Review of Systems:  ROS limited by patient's clinical condition.  Past  Medical History:  Diagnosis Date   Abdominal pain 10-27-2017   Allergy    sneeze, rhinorrhea in am   Anxiety state 09/03/2013   Breast mass, right 10/14/2012   Chicken pox as a child   Chronic pain syndrome 01/23/2015   Chronic renal insufficiency 03/17/2011   Colon cancer (HCC)    Complication of anesthesia    agitated when waking up, wakes up shaking, like  I'm going into shock   Constipation 10/27/17   Depression    husband died  3 months ago, pt. admits depression  currently    Diabetic neuropathy (HCC) 03/17/2011   DM (diabetes mellitus) type 2, uncontrolled, with ketoacidosis (HCC) 03/17/2011   Ductal hyperplasia of breast 03/17/2011   Essential hypertension, benign 05/31/2010   Qualifier: Diagnosis of  By: Lavona, MD, CODY Lynwood Noel 07/15/2021   Fibromyalgia    GERD (gastroesophageal reflux disease)    H/O echocardiogram    done /w [place] in 2011, saw Dr. Lavona for tachycardia    Headache    History of colon cancer 03/17/2011   Hyperlipidemia    6 years   Hyperlipidemia, mixed    6 years   Hypertension    15 years, reports she has a rapid heartrate    Loss of weight 12/14/2013   Measles as a child   Osteoarthritis (arthritis due to wear and tear of joints) 03/17/2011   Noted diffusely including neck   Osteopenia 12/14/2013   Pain in finger of right hand 05/15/2017  Pain in joint, lower leg 09/03/2013   Pain of right shoulder region 07/07/2014   Peripheral neuropathy    SOB (shortness of breath) 05/31/2010   Qualifier: Diagnosis of  By: Lavona, MD, CODY Agent     Stress 06/01/2013   Tachycardia 05/31/2010   Qualifier: Diagnosis of  By: Lavona, MD, CODY Agent     Type 2 diabetes mellitus with peripheral neuropathy (HCC) 03/17/2011   Vitamin B 12 deficiency 07/07/2014   intrinsic factor positive    Past Surgical History:  Procedure Laterality Date   BREAST EXCISIONAL BIOPSY Right 2013   benign   BREAST SURGERY  2013   right- benign   CATARACT  EXTRACTION     /w IOL- bilateral    COLON SURGERY     For resection of cancer   Nodule removed     From throat    Social History:   reports that she has never smoked. She has never used smokeless tobacco. She reports that she does not drink alcohol  and does not use drugs.  Allergies  Allergen Reactions   Tape Itching and Rash   Actos [Pioglitazone Hydrochloride] Other (See Comments)    Unknown reaction   Buspar [Buspirone Hcl] Other (See Comments)    Unknown reaction   Doxycycline  Other (See Comments)    Made the patient feel funny- in a negative way   Lipitor [Atorvastatin Calcium] Other (See Comments)    It made me hurt all over.   Penicillins Rash   Seroquel  [Quetiapine ] Rash    Family History  Problem Relation Age of Onset   Cancer Mother        Brain   Cancer Father        Colorectal   Cancer Sister        breast   Other Brother        pacemaker   Cancer Brother        pancreatic cancer   Arthritis Sister    Emphysema Brother    Cancer Brother    COPD Brother    Neuropathy Daughter    Fibromyalgia Daughter    Cancer Brother        metastatic colon cancer   Heart disease Neg Hx        Early     Prior to Admission medications   Medication Sig Start Date End Date Taking? Authorizing Provider  acetaminophen  (TYLENOL ) 500 MG tablet Take 2 tablets (1,000 mg total) by mouth every 8 (eight) hours. 03/28/23   Rosario Leatrice FERNS, MD  apixaban  (ELIQUIS ) 2.5 MG TABS tablet Take 1 tablet (2.5 mg total) by mouth 2 (two) times daily. 12/11/23   Revankar, Rajan R, MD  Cholecalciferol (VITAMIN D3) 50 MCG (2000 UT) capsule Take 2,000 Units by mouth daily.     [provider]  diazepam  (VALIUM ) 5 MG tablet TAKE 1/2 TO 1 TABLET DAILY AS NEEDED FOR ANXIETY 08/30/23   Domenica Harlene LABOR, MD  furosemide  (LASIX ) 20 MG tablet Take 1 tablet (20 mg total) by mouth daily. Take for 3 days, then switch to as needed use for swelling associated with weight gain of 3 pounds in  24 hours or 6 pounds in a week. Let us  know if you are having to use frequently so we can monitor kidney function. Patient taking differently: Take 20 mg by mouth daily as needed. 03/28/23 08/18/24  Rosario Leatrice I, MD  gabapentin  (NEURONTIN ) 800 MG tablet Take 1 tablet (800 mg total) by mouth 3 (three)  times daily. Patient taking differently: Take 800 mg by mouth. 2 or 3 times a day 11/20/22   Domenica Harlene LABOR, MD  metoprolol  tartrate (LOPRESSOR ) 25 MG tablet Take 1 tablet twice daily. Please hold medication on the days systolic blood pressure (top number) is less than 90. 03/21/23   Monge, Damien BROCKS, NP  mometasone -formoterol  (DULERA ) 200-5 MCG/ACT AERO Inhale 2 puffs into the lungs 2 (two) times daily. Patient taking differently: Inhale 2 puffs into the lungs as needed for wheezing or shortness of breath. 01/15/23   Akula, Vijaya, MD  QUEtiapine  (SEROQUEL ) 25 MG tablet TAKE 1 TABLET BY MOUTH AT BEDTIME AS NEEDED. 08/29/23   Onita Duos, MD  rOPINIRole  (REQUIP ) 1 MG tablet TAKE 1 TABLET BY MOUTH EVERYDAY AT BEDTIME 10/11/23   Domenica Harlene LABOR, MD  tiZANidine  (ZANAFLEX ) 4 MG tablet TAKE 1 TABLET (4 MG TOTAL) BY MOUTH 2 (TWO) TIMES DAILY AS NEEDED FOR MUSCLE SPASMS. 11/01/23   Domenica Harlene LABOR, MD  venlafaxine  XR (EFFEXOR -XR) 37.5 MG 24 hr capsule Take 1 capsule (37.5 mg total) by mouth daily with breakfast. 11/18/23   Domenica Harlene LABOR, MD    Physical Exam: Vitals:   01/11/24 1615 01/11/24 1630 01/11/24 1700 01/11/24 1900  BP: (!) 132/109  139/65   Pulse: (!) 42 (!) 53    Resp: (!) 25 (!) 24 19 (!) 24  Temp:      TempSrc:      SpO2: (!) 84% 95%      Constitutional: NAD, no diaphoresis   Eyes: PERTLA, lids and conjunctivae normal ENMT: Mucous membranes are dry. Posterior pharynx clear of any exudate or lesions.   Neck: supple, no masses  Respiratory: no wheezing, no crackles. No accessory muscle use.  Cardiovascular: S1 & S2 heard, regular rate and rhythm. No extremity edema.  Abdomen: No  distension, no tenderness, soft. Bowel sounds active.  Musculoskeletal: no clubbing / cyanosis. No joint deformity upper and lower extremities.   Skin: no significant rashes, lesions, ulcers. Warm, dry, well-perfused. Neurologic: CN 2-12 grossly intact. Moving all extremities. Sleeping, wakes to voice, recognized daughter at bedside.  Psychiatric: Disoriented. Cooperative.    Labs and Imaging on Admission: I have personally reviewed following labs and imaging studies  CBC: Recent Labs  Lab 01/11/24 1617 01/11/24 1632  WBC 11.6*  --   NEUTROABS 8.0*  --   HGB 17.3* 18.0*  HCT 55.6* 53.0*  MCV 92.4  --   PLT 281  --    Basic Metabolic Panel: Recent Labs  Lab 01/11/24 1617 01/11/24 1632  NA 146* 140  K 3.9 5.1  CL 111 125*  CO2 15*  --   GLUCOSE 263* 231*  BUN 119* 127*  CREATININE 2.02* 1.90*  CALCIUM 9.8  --    GFR: Estimated Creatinine Clearance: 14.9 mL/min (A) (by C-G formula based on SCr of 1.9 mg/dL (H)). Liver Function Tests: Recent Labs  Lab 01/11/24 1617  AST 42*  ALT 18  ALKPHOS 51  BILITOT 1.0  PROT 6.6  ALBUMIN 3.1*   No results for input(s): LIPASE, AMYLASE in the last 168 hours. No results for input(s): AMMONIA in the last 168 hours. Coagulation Profile: Recent Labs  Lab 01/11/24 1617  INR 1.7*   Cardiac Enzymes: No results for input(s): CKTOTAL, CKMB, CKMBINDEX, TROPONINI in the last 168 hours. BNP (last 3 results) Recent Labs    04/10/23 1557 05/13/23 1523  PROBNP 558.0* 414.0*   HbA1C: No results for input(s): HGBA1C in the last 72  hours. CBG: Recent Labs  Lab 01/11/24 1604  GLUCAP 270*   Lipid Profile: No results for input(s): CHOL, HDL, LDLCALC, TRIG, CHOLHDL, LDLDIRECT in the last 72 hours. Thyroid  Function Tests: No results for input(s): TSH, T4TOTAL, FREET4, T3FREE, THYROIDAB in the last 72 hours. Anemia Panel: No results for input(s): VITAMINB12, FOLATE, FERRITIN, TIBC,  IRON, RETICCTPCT in the last 72 hours. Urine analysis:    Component Value Date/Time   COLORURINE YELLOW 01/11/2024 1925   APPEARANCEUR HAZY (A) 01/11/2024 1925   LABSPEC 1.023 01/11/2024 1925   PHURINE 5.0 01/11/2024 1925   GLUCOSEU NEGATIVE 01/11/2024 1925   GLUCOSEU NEGATIVE 10/18/2021 1537   HGBUR NEGATIVE 01/11/2024 1925   BILIRUBINUR NEGATIVE 01/11/2024 1925   KETONESUR NEGATIVE 01/11/2024 1925   PROTEINUR 100 (A) 01/11/2024 1925   UROBILINOGEN 0.2 10/18/2021 1537   NITRITE NEGATIVE 01/11/2024 1925   LEUKOCYTESUR NEGATIVE 01/11/2024 1925   Sepsis Labs: @LABRCNTIP (procalcitonin:4,lacticidven:4) ) Recent Results (from the past 240 hours)  Resp panel by RT-PCR (RSV, Flu A&B, Covid) Anterior Nasal Swab     Status: Abnormal   Collection Time: 01/11/24  4:17 PM   Specimen: Anterior Nasal Swab  Result Value Ref Range Status   SARS Coronavirus 2 by RT PCR POSITIVE (A) NEGATIVE Final   Influenza A by PCR NEGATIVE NEGATIVE Final   Influenza B by PCR NEGATIVE NEGATIVE Final    Comment: (NOTE) The Xpert Xpress SARS-CoV-2/FLU/RSV plus assay is intended as an aid in the diagnosis of influenza from Nasopharyngeal swab specimens and should not be used as a sole basis for treatment. Nasal washings and aspirates are unacceptable for Xpert Xpress SARS-CoV-2/FLU/RSV testing.  Fact Sheet for Patients: bloggercourse.com  Fact Sheet for Healthcare Providers: seriousbroker.it  This test is not yet approved or cleared by the [place]  FDA and has been authorized for detection and/or diagnosis of SARS-CoV-2 by FDA under an Emergency Use Authorization (EUA). This EUA will remain in effect (meaning this test can be used) for the duration of the COVID-19 declaration under Section 564(b)(1) of the Act, 21 U.S.C. section 360bbb-3(b)(1), unless the authorization is terminated or revoked.     Resp Syncytial Virus by PCR NEGATIVE NEGATIVE  Final    Comment: (NOTE) Fact Sheet for Patients: bloggercourse.com  Fact Sheet for Healthcare Providers: seriousbroker.it  This test is not yet approved or cleared by the [place]  FDA and has been authorized for detection and/or diagnosis of SARS-CoV-2 by FDA under an Emergency Use Authorization (EUA). This EUA will remain in effect (meaning this test can be used) for the duration of the COVID-19 declaration under Section 564(b)(1) of the Act, 21 U.S.C. section 360bbb-3(b)(1), unless the authorization is terminated or revoked.  Performed at [facility], [address], [place], [place] [address]      Radiological Exams on Admission: CT ANGIO HEAD NECK W WO CM Result Date: 01/11/2024 CLINICAL DATA:  Found unresponsive EXAM: CT ANGIOGRAPHY HEAD AND NECK WITH AND WITHOUT CONTRAST TECHNIQUE: Multidetector CT imaging of the head and neck was performed using the standard protocol during bolus administration of intravenous contrast. Multiplanar CT image reconstructions and MIPs were obtained to evaluate the vascular anatomy. Carotid stenosis measurements (when applicable) are obtained utilizing NASCET criteria, using the distal internal carotid diameter as the denominator. RADIATION DOSE REDUCTION: This exam was performed according to the departmental dose-optimization program which includes automated exposure control, adjustment of the mA and/or kV according to patient size and/or use of iterative reconstruction technique. CONTRAST:  75mL OMNIPAQUE  IOHEXOL   350 MG/ML SOLN COMPARISON:  No prior CTA available, correlation is made with 08/05/2023 CT head FINDINGS: CT HEAD FINDINGS Brain: No evidence of acute infarct, hemorrhage, mass, mass effect, or midline shift. No hydrocephalus or extra-axial fluid collection. Age related cerebral atrophy. Vascular: No hyperdense vessel. Skull: Negative for fracture or focal lesion. Sinuses/Orbits:  Mucosal thickening in the maxillary sinuses and ethmoid air cells. Status post bilateral lens replacements. Other: The mastoid air cells are well aerated. CTA NECK FINDINGS Aortic arch: Standard branching. Imaged portion shows no evidence of aneurysm or dissection. No significant stenosis of the major arch vessel origins. Aortic atherosclerosis. Right carotid system: No evidence of dissection, occlusion, or hemodynamically significant stenosis (greater than 50%). Left carotid system: No evidence of dissection, occlusion, or hemodynamically significant stenosis (greater than 50%). Vertebral arteries: No evidence of dissection, occlusion, or hemodynamically significant stenosis (greater than 50%). Skeleton: No acute osseous abnormality. Degenerative changes in the cervical spine. Other neck: No acute finding. Upper chest: No focal pulmonary opacity or pleural effusion. Review of the MIP images confirms the above findings CTA HEAD FINDINGS Anterior circulation: Both internal carotid arteries are patent to the termini, without significant stenosis. A1 segments patent. Normal anterior communicating artery. Anterior cerebral arteries are patent to their distal aspects without significant stenosis. No M1 stenosis or occlusion. MCA branches perfused to their distal aspects without significant stenosis. Posterior circulation: Vertebral arteries patent to the vertebrobasilar junction without significant stenosis. Posterior inferior cerebellar arteries patent proximally. Basilar patent to its distal aspect without significant stenosis. Redemonstration in the proximal basilar. Superior cerebellar arteries patent proximally. Patent left P1, with mild stenosis proximally (series 9, image 92). Aplastic right P1. Fetal origin of the right PCA from the right posterior communicating artery, with severe stenosis in the distal right posterior communicating artery (series 9, image 95). Severe stenosis in the proximal right P2 (series 9,  image 95) and mild stenosis in the distal right P2 (series 9, image 96). Moderate stenosis in the proximal left P (series 9, images 95-96). Mild-to-moderate stenosis in the distal left P2 (series 9, image 98). Venous sinuses: As permitted by contrast timing, patent. Anatomic variants: Fetal origin of the right PCA. No evidence of aneurysm or vascular malformation. Review of the MIP images confirms the above findings IMPRESSION: 1. No acute intracranial process. 2. No intracranial large vessel occlusion. Severe stenosis in the distal right posterior communicating artery and proximal right P2, and moderate stenosis in the proximal left P2. 3. No hemodynamically significant stenosis in the neck. 4. Aortic atherosclerosis. Aortic Atherosclerosis (ICD10-I70.0). Electronically Signed   By: Donald Campion M.D.   On: 01/11/2024 19:54   CT Chest W Contrast Result Date: 01/11/2024 CLINICAL DATA:  Sepsis, patient found unresponsive. EXAM: CT CHEST WITH CONTRAST TECHNIQUE: Multidetector CT imaging of the chest was performed during intravenous contrast administration. RADIATION DOSE REDUCTION: This exam was performed according to the departmental dose-optimization program which includes automated exposure control, adjustment of the mA and/or kV according to patient size and/or use of iterative reconstruction technique. CONTRAST:  75mL OMNIPAQUE  IOHEXOL  350 MG/ML SOLN COMPARISON:  Chest CT 01/08/2023 and chest radiograph 01/11/2024 FINDINGS: Cardiovascular: Coronary, aortic arch, and branch vessel atherosclerotic vascular disease. Right atrial enlargement. Mediastinum/Nodes: Small type 1 hiatal hernia. Lungs/Pleura: Biapical pleuroparenchymal scarring. Faint tree-in-bud nodularity posteriorly in the right upper lobe on image 72 series 9. This is probably from atypical infectious bronchiolitis and is minimal. Tree-in-bud nodularity and associated cylindrical bronchiectasis anteromedially in the left upper lobe with some  associated  airway plugging in this vicinity and in the lingula. Left lower lobe cylindrical bronchiectasis with scattered plugging and mild inflammatory nodularity is observed. This likely represents atypical infectious bronchiolitis and is mildly increased in the left lower lobe compared to last year. Upper Abdomen: Left renal atrophy. Small bilateral renal cysts. No further imaging workup of these lesions is indicated. Several small cystic lesions of the pancreas are observed, the largest in the tail the pancreas measures 1.1 by 1.1 by 1.1 cm on image 175 series 17. Possibilities include postinflammatory cystic lesion or intraductal papillary mucinous neoplasm. The prior CT chest did not extend so far down into the abdomen as to include this previously. Musculoskeletal: Degenerative sternoclavicular arthropathy, right greater than left. Thoracic kyphosis. IMPRESSION: 1. Tree-in-bud nodularity and associated cylindrical bronchiectasis in the left upper lobe, lingula, and left lower lobe, with some associated airway plugging. This likely represents atypical infectious bronchiolitis and is mildly increased in the left lower lobe compared to last year. 2. Coronary, aortic arch, and branch vessel atherosclerotic vascular disease. 3. Right atrial enlargement. 4. Small type 1 hiatal hernia. 5. Left renal atrophy. 6. Several small cystic lesions of the pancreas, the largest in the tail the pancreas measures 1.1 by 1.1 by 1.1 cm. Possibilities include postinflammatory cystic lesion or intraductal papillary mucinous neoplasm. Follow up pancreatic protocol CT or MRI is recommended in 2 years time. This recommendation follows ACR consensus guidelines: Management of Incidental Pancreatic Cysts: A White Paper of the ACR Incidental Findings Committee. J Am Coll Radiol 2017;14:911-923. 7. Degenerative sternoclavicular arthropathy, right greater than left. 8. Thoracic kyphosis. 9. Aortic atherosclerosis. Aortic Atherosclerosis  (ICD10-I70.0). Electronically Signed   By: Ryan Salvage M.D.   On: 01/11/2024 19:34   DG Chest Port 1 View Result Date: 01/11/2024 CLINICAL DATA:  Questionable sepsis. EXAM: PORTABLE CHEST 1 VIEW COMPARISON:  None Available. FINDINGS: Normal mediastinum and cardiac silhouette. Lungs are hyperinflated. Normal pulmonary vasculature. No evidence of effusion, infiltrate, or pneumothorax. No acute bony abnormality. IMPRESSION: 1. No acute cardiopulmonary process. 2. Hyperinflation. Electronically Signed   By: Jackquline Boxer M.D.   On: 01/11/2024 16:41    EKG: Independently reviewed. Atrial fibrillation, rate 123, RBBB, LAFB.   Assessment/Plan   1. AKI superimposed on CKD 3B - BUN is 119 and SCr 2.02 on admission, up from 30 & 1.25 in November 2024    - Likely prerenal azotemia in setting of recent anorexia  - Continue IVF hydration, renally-dose medications, repeat chem panel in am    2. Acute encephalopathy  - No acute findings on head CT  - Likely d/t ARF/uremia   - Use delirium precautions, address AKI as above   3. Recent COVID-19 infection  - Pt tested positive for COVID-19 on 1/1 after >1 week of fatigue mild cough - She is no longer coughing, has been afebrile, current illness is related to acute on chronic renal failure, and while COVID pcr continues to be positive, she no longer requires isolation   4. Atrial fibrillation  - Continue Eliquis  and metoprolol  as tolerated    5. Type II DM  - A1c was 6.5% in October 2024, diet-controlled at home, serum glucose 263 in ED  - Check CBGs and use low-intensity SSI for now    6. Dementia, depression, anxiety  - Use delirium precautions, hold sedating medications for now   7. Pancreatic lesions - Noted incidentally on CT  - Outpatient follow-up recommended if family feels patient would want workup and treatment  DVT prophylaxis: Eliquis   Code Status: DNR  Level of Care: Level of care: Progressive Family Communication:  Daughter at bedside   Disposition Plan:  Patient is from: Home  Anticipated d/c is to: TBD Anticipated d/c date is: 01/15/24 Patient currently: Pending improved renal function and mental status  Consults called: None  Admission status: Inpatient     Dana GORMAN Sprinkles, MD [facility]  01/11/2024, 8:43 PM

## 2024-01-11 NOTE — Progress Notes (Signed)
 Ct complete, delayed due to lack of IV access after IV blew during earlier attempt.

## 2024-01-11 NOTE — ED Triage Notes (Signed)
 EMS: pt is DNR, DNI reported. Recent covid reported.  Household member found her unresponsive called 911, she had pulse and was breathing. Begain to  Amb[facility] bagger her, found dnr, then decided to bring her in and not intubate or other interventions.   Last known normal 15 minutes prior at 1500.Ems arrived at 1520.  Vitals:  120/90 Cbg 314 RR 18 Hr 96 Spo2 100%   DNR/DNI brought in by family at 64.  Afib reported by family member.

## 2024-01-12 DIAGNOSIS — Z7189 Other specified counseling: Secondary | ICD-10-CM

## 2024-01-12 DIAGNOSIS — N179 Acute kidney failure, unspecified: Secondary | ICD-10-CM | POA: Diagnosis not present

## 2024-01-12 DIAGNOSIS — N1832 Chronic kidney disease, stage 3b: Secondary | ICD-10-CM | POA: Diagnosis not present

## 2024-01-12 DIAGNOSIS — Z515 Encounter for palliative care: Secondary | ICD-10-CM

## 2024-01-12 LAB — BASIC METABOLIC PANEL
Anion gap: 11 (ref 5–15)
BUN: 111 mg/dL — ABNORMAL HIGH (ref 8–23)
CO2: 23 mmol/L (ref 22–32)
Calcium: 9.1 mg/dL (ref 8.9–10.3)
Chloride: 109 mmol/L (ref 98–111)
Creatinine, Ser: 1.91 mg/dL — ABNORMAL HIGH (ref 0.44–1.00)
GFR, Estimated: 24 mL/min — ABNORMAL LOW (ref >60)
Glucose, Bld: 208 mg/dL — ABNORMAL HIGH (ref 70–99)
Potassium: 3.8 mmol/L (ref 3.5–5.1)
Sodium: 143 mmol/L (ref 135–145)

## 2024-01-12 LAB — GLUCOSE, CAPILLARY
Glucose-Capillary: 138 mg/dL — ABNORMAL HIGH (ref 70–99)
Glucose-Capillary: 129 mg/dL — ABNORMAL HIGH (ref 70–99)
Glucose-Capillary: 119 mg/dL — ABNORMAL HIGH (ref 70–99)

## 2024-01-12 LAB — HEPATIC FUNCTION PANEL
ALT: 16 U/L (ref 0–44)
AST: 32 U/L (ref 15–41)
Albumin: 2.8 g/dL — ABNORMAL LOW (ref 3.5–5.0)
Alkaline Phosphatase: 47 U/L (ref 38–126)
Bilirubin, Direct: 0.2 mg/dL (ref 0.0–0.2)
Indirect Bilirubin: 0.7 mg/dL (ref 0.3–0.9)
Total Bilirubin: 0.9 mg/dL (ref 0.0–1.2)
Total Protein: 5.8 g/dL — ABNORMAL LOW (ref 6.5–8.1)

## 2024-01-12 LAB — CBC
HCT: 48.3 % — ABNORMAL HIGH (ref 36.0–46.0)
Hemoglobin: 15.8 g/dL — ABNORMAL HIGH (ref 12.0–15.0)
MCH: 29.4 pg (ref 26.0–34.0)
MCHC: 32.7 g/dL (ref 30.0–36.0)
MCV: 89.8 fL (ref 80.0–100.0)
Platelets: 253 10*3/uL (ref 150–400)
RBC: 5.38 MIL/uL — ABNORMAL HIGH (ref 3.87–5.11)
RDW: 14.7 % (ref 11.5–15.5)
WBC: 19.6 10*3/uL — ABNORMAL HIGH (ref 4.0–10.5)
nRBC: 0 % (ref 0.0–0.2)

## 2024-01-12 LAB — CBG MONITORING, ED
Glucose-Capillary: 183 mg/dL — ABNORMAL HIGH (ref 70–99)
Glucose-Capillary: 141 mg/dL — ABNORMAL HIGH (ref 70–99)

## 2024-01-12 LAB — PROCALCITONIN: Procalcitonin: <0.1 ng/mL

## 2024-01-12 LAB — MAGNESIUM: Magnesium: 2.6 mg/dL — ABNORMAL HIGH (ref 1.7–2.4)

## 2024-01-12 LAB — LACTIC ACID, PLASMA
Lactic Acid, Venous: 3.3 mmol/L (ref 0.5–1.9)
Lactic Acid, Venous: 2.7 mmol/L (ref 0.5–1.9)

## 2024-01-12 LAB — PHOSPHORUS: Phosphorus: 2.7 mg/dL (ref 2.5–4.6)

## 2024-01-12 MED ORDER — LORAZEPAM 2 MG/ML IJ SOLN
0.5000 mg | Freq: Four times a day (QID) | INTRAMUSCULAR | Status: DC | PRN
  Administered 2024-01-12: 0.5 mg via INTRAVENOUS
  Filled 2024-01-12: qty 1

## 2024-01-12 MED ORDER — DOXYCYCLINE HYCLATE 100 MG IV SOLR
100.0000 mg | Freq: Two times a day (BID) | INTRAVENOUS | Status: DC
  Administered 2024-01-13 (×2): 100 mg via INTRAVENOUS
  Filled 2024-01-12 (×3): qty 100

## 2024-01-12 MED ORDER — AZITHROMYCIN 500 MG PO TABS
500.0000 mg | ORAL_TABLET | Freq: Every day | ORAL | Status: DC
  Administered 2024-01-12: 500 mg via ORAL
  Filled 2024-01-12: qty 2

## 2024-01-12 MED ORDER — SODIUM CHLORIDE 0.9 % IV SOLN
INTRAVENOUS | Status: DC
Start: 2024-01-12 — End: 2024-01-13

## 2024-01-12 MED ORDER — SODIUM CHLORIDE 0.9 % IV BOLUS
1000.0000 mL | Freq: Once | INTRAVENOUS | Status: AC
  Administered 2024-01-12: 1000 mL via INTRAVENOUS

## 2024-01-12 MED ORDER — MIDODRINE HCL 5 MG PO TABS
10.0000 mg | ORAL_TABLET | Freq: Three times a day (TID) | ORAL | Status: DC
  Administered 2024-01-13: 10 mg via ORAL
  Filled 2024-01-12 (×2): qty 2

## 2024-01-12 MED ORDER — MELATONIN 3 MG PO TABS
3.0000 mg | ORAL_TABLET | Freq: Every day | ORAL | Status: DC
  Administered 2024-01-12: 3 mg via ORAL
  Filled 2024-01-12: qty 1

## 2024-01-12 MED ORDER — CEFTRIAXONE SODIUM 2 G IJ SOLR
2.0000 g | INTRAMUSCULAR | Status: DC
  Administered 2024-01-12 – 2024-01-13 (×2): 2 g via INTRAVENOUS
  Filled 2024-01-12 (×2): qty 20

## 2024-01-12 MED ORDER — SODIUM CHLORIDE 0.9 % IV SOLN: 500.0000 mg | INTRAVENOUS | Status: DC

## 2024-01-12 NOTE — Progress Notes (Signed)
 Patient received from ED, Vital signs taken, CHG bath given, CCMD notified.  Patient's skin was clammy, rectal tempr was 98.2, warm blankets applied. Patient was moving her left hand and interfered with IVF transfusion so soft restrains restarted at 1310. Stage1 pressure injuiry noted on BL buttocks,  Family at bedside, orientation provided about the unit, call bell given    01/12/24 1314  Vitals  Temp 98.2 F (36.8 C)  Temp Source Rectal  BP (!) 92/58  MAP (mmHg) 69  BP Location Right Arm  BP Method Automatic  Patient Position (if appropriate) Lying  Pulse Rate 87  Pulse Rate Source Monitor  ECG Heart Rate 99  Resp (!) 28  Level of Consciousness  Level of Consciousness Alert  MEWS COLOR  MEWS Score Color Yellow  Oxygen Therapy  SpO2 100 %  O2 Device Nasal Cannula  O2 Flow Rate (L/min) 2 L/min  Pain Assessment  Pain Scale Faces  Pain Score 4  Faces Pain Scale 4  Pain Type Acute pain  Pain Location Generalized  Pain Orientation Left;Right  Pain Descriptors / Indicators Aching  Pain Intervention(s) Repositioned;Rest  Glasgow Coma Scale  Eye Opening 4  Best Verbal Response (NON-intubated) 3  Best Motor Response 5  Glasgow Coma Scale Score 12  MEWS Score  MEWS Temp 0  MEWS Systolic 1  MEWS Pulse 0  MEWS RR 2  MEWS LOC 0  MEWS Score 3

## 2024-01-12 NOTE — Progress Notes (Signed)
 Patient's blood pressure is on soft side, attempted to give her ordered midodrine  10mg  but pt was so restless and unable to swallow so stopped trying and informed to MD, Rapid called for 2nd opinion MD was at bedside, IVF bolus given as per order, will continue to monitor  01/12/24 1330  Vitals  BP (!) 82/53  MAP (mmHg) (!) 62  BP Location Right Arm  BP Method Automatic  Patient Position (if appropriate) Lying  Pulse Rate 60  Pulse Rate Source Monitor  ECG Heart Rate 88  Resp 17  Level of Consciousness  Level of Consciousness Alert  MEWS COLOR  MEWS Score Color Green  Oxygen Therapy  SpO2 100 %  O2 Device Nasal Cannula  O2 Flow Rate (L/min) 2 L/min  MEWS Score  MEWS Temp 0  MEWS Systolic 1  MEWS Pulse 0  MEWS RR 0  MEWS LOC 0  MEWS Score 1

## 2024-01-12 NOTE — Progress Notes (Signed)
 PROGRESS NOTE  GWYNNETH FABIO  FMW:[id number] DOB: 1926-07-26 DOA: 01/11/2024 PCP: Domenica Harlene LABOR, MD   Brief Narrative: Patient is a 89 year old female with history of depression, anxiety, diabetes, hypertension, hyperlipidemia, A-fib on Eliquis , CKD stage IIIa, dementia who was brought to the emergency department after she was found to be unresponsive at home by her daughter.  Patient developed fatigue, cough, malaise for almost a month.  She was diagnosed with COVID on 1/1.  Cough has resolved.  She lost her appetite and was very weak/ fatigued,very poor oral intake.  On presentation,she was mildly tachypneic, bradycardic but blood  pressure was stable.  Chest x-ray did not show any acute findings.  CT head negative for acute findings.  CT chest showed possible atypical infection but findings are similar to previous CT about a year ago.  Labs are creatinine of 2, lactate of 5.8, WC count of 11.6.  Started on antibiotics for possible sepsis secondary pneumonia, started on IV fluids.  She also went into A-fib with RVR in the Emergency Department.  Remained agitated. Palliative care consulted for goals of care.  Assessment & Plan:  Principal Problem:   Acute renal failure superimposed on stage 3b chronic kidney disease (HCC) Active Problems:   Chronic atrial fibrillation with RVR (HCC)   Type 2 diabetes mellitus with peripheral neuropathy (HCC)   Depression with anxiety   Moderate dementia with psychotic disturbance (HCC)   Metabolic acidosis   Pancreatic lesion   Acute encephalopathy   AKI on CKD stage IIIb: Presented with creatinine in the range of 2.  Baseline creatinine around 1.3-1.5.  Likely associated with decreased oral intake.  Appears dehydrated.Continue  gentle IV fluids.  Avoid nephrotoxins.  Monitor BMP  Acute encephalopathy: No acute findings on CT.  History of dementia though.  Likely from dehydration, uremia.  Continue delirium precaution, frequent reorientation  Recent  history of COVID 19: Likely contributing to dehydration,weakness,poor oral intake  On room air.  CT chest showed tree-in-bud nodularity and associated cylindrical bronchiectasis in the left upper lobe, lingula, and left lower lobe, with someassociated airway plugging suspicious for atypical infectious bronchiolitis.  Procalcitonin reassuring.  Continue current antibiotics  Elevated lactate/leukocytosis: Secondary to dehydration/ hemoconcentration .procalcitonin has been reassuring. Continue current antibiotics for now.  Follow-up cultures  Chronic A-fib: On Eliquis  and metoprolol .  Monitor on telemetry.  Was in A-fib with RVR this morning but became hypotensive with controlled rate later this afternoon.  Metoprolol  on hold.  Type 2 diabetes: A1c of 6.5.  Currently on sliding scale.  Monitor blood sugars  Dementia/depression/anxiety: Continue supportive care.  Hold sedating medications as much as possible  Pancreatic lesions: Incidentally found to have cystic lesions on CT.  Outpatient follow-up recommended.  Further investigation in this 89 year old female is questionable  Goals of care: 89 year old female with history of dementia, currently living with daughter.  Ambulatory with walker.  Has significantly declined after recent COVID infection.  Has poor oral intake.  Now has AKI, agitation, encephalopathy, hypotension.  High suspicion for further decline.  Palliative care consulted for goals of care. Goals of care discussed extensively with daughter at bedside.  She understand her prognosis.  Patient is a DNR.  Daughter is agreeable for IV fluids but does not prefer escalation of treatment, vasopressor therapy or anything that is uncomfortable to her.  If patient does not improve, she would opt for comfort care versus she wants to monitor for next 1 to 2 days.  DVT prophylaxis:apixaban  (ELIQUIS ) tablet 2.5 mg Start: 01/11/24 2200 apixaban  (ELIQUIS ) tablet 2.5 mg     Code Status: Limited:  Do not attempt resuscitation (DNR) -DNR-LIMITED -Do Not Intubate/DNI   Family Communication: Daughter at bedside on 1/12  Patient status:Inpatient  Patient is from :home  Anticipated discharge to:not sure  Estimated DC date:not sure   Consultants: Palliative care  Procedures:None  Antimicrobials:  Anti-infectives (From admission, onward)    Start     Dose/Rate Route Frequency Ordered Stop   01/12/24 1630  cefTRIAXone  (ROCEPHIN ) 2 g in sodium chloride  0.9 % 100 mL IVPB  Status:  Discontinued        2 g 200 mL/hr over 30 Minutes Intravenous Every 24 hours 01/11/24 2043 01/11/24 2110   01/11/24 2045  doxycycline  (VIBRAMYCIN ) 100 mg in dextrose  5 % 250 mL IVPB  Status:  Discontinued        100 mg 125 mL/hr over 120 Minutes Intravenous Every 12 hours 01/11/24 2043 01/11/24 2110   01/11/24 1630  levofloxacin  (LEVAQUIN ) IVPB 750 mg  Status:  Discontinued        750 mg 100 mL/hr over 90 Minutes Intravenous  Once 01/11/24 1618 01/11/24 1621   01/11/24 1630  cefTRIAXone  (ROCEPHIN ) 2 g in sodium chloride  0.9 % 100 mL IVPB       Placed in Followed by Linked Group   2 g 200 mL/hr over 30 Minutes Intravenous  Once 01/11/24 1621 01/11/24 2126   01/11/24 1630  azithromycin  (ZITHROMAX ) 500 mg in sodium chloride  0.9 % 250 mL IVPB       Placed in Followed by Linked Group   500 mg 250 mL/hr over 60 Minutes Intravenous  Once 01/11/24 1621 01/11/24 2350       Subjective: Patient seen and examined at the bedside this morning in the emergency department and again on the floor this afternoon.  Earlier, she was in A-fib with RVR, agitated with a stable blood pressure.  During my evaluation on the floor, her heart rate is below 100, blood pressure soft, she is somnolent/not agitated.  Appears dehydrated.  On 2 to 3 L of oxygen per minute.  Not in respiratory distress.  Long discussion held with the daughter at bedside about goals of care  Objective: Vitals:   01/12/24 0400 01/12/24 0530  01/12/24 0600 01/12/24 0700  BP: 121/83 (!) 132/115 119/77   Pulse:  (!) 136 (!) 105   Resp:  (!) 24 16   Temp:    98.2 F (36.8 C)  TempSrc:    Oral  SpO2:  100% 100%     Intake/Output Summary (Last 24 hours) at 01/12/2024 0842 Last data filed at 01/12/2024 0701 Gross per 24 hour  Intake 1262.44 ml  Output 450 ml  Net 812.44 ml   There were no vitals filed for this visit.  Examination:  General exam: Very weak, deconditioned, frail looking, malnourished,cachetic HEENT: PERRL, dry mouth Respiratory system:  no wheezes or crackles , diminished air sounds bilaterally in bases Cardiovascular system: Irregularly irregular rhythm.  Gastrointestinal system: Abdomen is nondistended, soft and nontender. Central nervous system: Alert and but not oriented Extremities: No edema, no clubbing ,no cyanosis Skin: No rashes, no ulcers,no icterus     Data Reviewed: I have personally reviewed following labs and imaging studies  CBC: Recent Labs  Lab 01/11/24 1617 01/11/24 1632 01/12/24 0438  WBC 11.6*  --  19.6*  NEUTROABS 8.0*  --   --   HGB 17.3* 18.0* 15.8*  HCT 55.6* 53.0* 48.3*  MCV 92.4  --  89.8  PLT 281  --  253   Basic Metabolic Panel: Recent Labs  Lab 01/11/24 1617 01/11/24 1632 01/12/24 0438  NA 146* 140 143  K 3.9 5.1 3.8  CL 111 125* 109  CO2 15*  --  23  GLUCOSE 263* 231* 208*  BUN 119* 127* 111*  CREATININE 2.02* 1.90* 1.91*  CALCIUM 9.8  --  9.1  MG  --   --  2.6*  PHOS  --   --  2.7     Recent Results (from the past 240 hours)  Resp panel by RT-PCR (RSV, Flu A&B, Covid) Anterior Nasal Swab     Status: Abnormal   Collection Time: 01/11/24  4:17 PM   Specimen: Anterior Nasal Swab  Result Value Ref Range Status   SARS Coronavirus 2 by RT PCR POSITIVE (A) NEGATIVE Final   Influenza A by PCR NEGATIVE NEGATIVE Final   Influenza B by PCR NEGATIVE NEGATIVE Final    Comment: (NOTE) The Xpert Xpress SARS-CoV-2/FLU/RSV plus assay is intended as an aid in  the diagnosis of influenza from Nasopharyngeal swab specimens and should not be used as a sole basis for treatment. Nasal washings and aspirates are unacceptable for Xpert Xpress SARS-CoV-2/FLU/RSV testing.  Fact Sheet for Patients: bloggercourse.com  Fact Sheet for Healthcare Providers: seriousbroker.it  This test is not yet approved or cleared by the [place]  FDA and has been authorized for detection and/or diagnosis of SARS-CoV-2 by FDA under an Emergency Use Authorization (EUA). This EUA will remain in effect (meaning this test can be used) for the duration of the COVID-19 declaration under Section 564(b)(1) of the Act, 21 U.S.C. section 360bbb-3(b)(1), unless the authorization is terminated or revoked.     Resp Syncytial Virus by PCR NEGATIVE NEGATIVE Final    Comment: (NOTE) Fact Sheet for Patients: bloggercourse.com  Fact Sheet for Healthcare Providers: seriousbroker.it  This test is not yet approved or cleared by the [place]  FDA and has been authorized for detection and/or diagnosis of SARS-CoV-2 by FDA under an Emergency Use Authorization (EUA). This EUA will remain in effect (meaning this test can be used) for the duration of the COVID-19 declaration under Section 564(b)(1) of the Act, 21 U.S.C. section 360bbb-3(b)(1), unless the authorization is terminated or revoked.  Performed at [facility], [address], [place], [place] [address]   Blood Culture (routine x 2)     Status: None (Preliminary result)   Collection Time: 01/11/24  4:30 PM   Specimen: BLOOD LEFT HAND  Result Value Ref Range Status   Specimen Description BLOOD LEFT HAND  Final   Special Requests   Final    BOTTLES DRAWN AEROBIC ONLY Blood Culture results may not be optimal due to an inadequate volume of blood received in culture bottles   Culture   Final    NO GROWTH < 24  HOURS Performed at [facility], [address] [place], [place] [address]    Report Status PENDING  Incomplete  Blood Culture (routine x 2)     Status: None (Preliminary result)   Collection Time: 01/11/24  6:30 PM   Specimen: BLOOD  Result Value Ref Range Status   Specimen Description BLOOD SITE NOT SPECIFIED  Final   Special Requests   Final    BOTTLES DRAWN AEROBIC AND ANAEROBIC Blood Culture results may not be optimal due to an inadequate volume of blood received in culture  bottles   Culture   Final    NO GROWTH < 12 HOURS Performed at [facility], [address] [place], [place] [address]    Report Status PENDING  Incomplete     Radiology Studies: CT ANGIO HEAD NECK W WO CM Result Date: 01/11/2024 CLINICAL DATA:  Found unresponsive EXAM: CT ANGIOGRAPHY HEAD AND NECK WITH AND WITHOUT CONTRAST TECHNIQUE: Multidetector CT imaging of the head and neck was performed using the standard protocol during bolus administration of intravenous contrast. Multiplanar CT image reconstructions and MIPs were obtained to evaluate the vascular anatomy. Carotid stenosis measurements (when applicable) are obtained utilizing NASCET criteria, using the distal internal carotid diameter as the denominator. RADIATION DOSE REDUCTION: This exam was performed according to the departmental dose-optimization program which includes automated exposure control, adjustment of the mA and/or kV according to patient size and/or use of iterative reconstruction technique. CONTRAST:  75mL OMNIPAQUE  IOHEXOL  350 MG/ML SOLN COMPARISON:  No prior CTA available, correlation is made with 08/05/2023 CT head FINDINGS: CT HEAD FINDINGS Brain: No evidence of acute infarct, hemorrhage, mass, mass effect, or midline shift. No hydrocephalus or extra-axial fluid collection. Age related cerebral atrophy. Vascular: No hyperdense vessel. Skull: Negative for fracture or focal lesion. Sinuses/Orbits: Mucosal thickening in the maxillary  sinuses and ethmoid air cells. Status post bilateral lens replacements. Other: The mastoid air cells are well aerated. CTA NECK FINDINGS Aortic arch: Standard branching. Imaged portion shows no evidence of aneurysm or dissection. No significant stenosis of the major arch vessel origins. Aortic atherosclerosis. Right carotid system: No evidence of dissection, occlusion, or hemodynamically significant stenosis (greater than 50%). Left carotid system: No evidence of dissection, occlusion, or hemodynamically significant stenosis (greater than 50%). Vertebral arteries: No evidence of dissection, occlusion, or hemodynamically significant stenosis (greater than 50%). Skeleton: No acute osseous abnormality. Degenerative changes in the cervical spine. Other neck: No acute finding. Upper chest: No focal pulmonary opacity or pleural effusion. Review of the MIP images confirms the above findings CTA HEAD FINDINGS Anterior circulation: Both internal carotid arteries are patent to the termini, without significant stenosis. A1 segments patent. Normal anterior communicating artery. Anterior cerebral arteries are patent to their distal aspects without significant stenosis. No M1 stenosis or occlusion. MCA branches perfused to their distal aspects without significant stenosis. Posterior circulation: Vertebral arteries patent to the vertebrobasilar junction without significant stenosis. Posterior inferior cerebellar arteries patent proximally. Basilar patent to its distal aspect without significant stenosis. Redemonstration in the proximal basilar. Superior cerebellar arteries patent proximally. Patent left P1, with mild stenosis proximally (series 9, image 92). Aplastic right P1. Fetal origin of the right PCA from the right posterior communicating artery, with severe stenosis in the distal right posterior communicating artery (series 9, image 95). Severe stenosis in the proximal right P2 (series 9, image 95) and mild stenosis in the  distal right P2 (series 9, image 96). Moderate stenosis in the proximal left P (series 9, images 95-96). Mild-to-moderate stenosis in the distal left P2 (series 9, image 98). Venous sinuses: As permitted by contrast timing, patent. Anatomic variants: Fetal origin of the right PCA. No evidence of aneurysm or vascular malformation. Review of the MIP images confirms the above findings IMPRESSION: 1. No acute intracranial process. 2. No intracranial large vessel occlusion. Severe stenosis in the distal right posterior communicating artery and proximal right P2, and moderate stenosis in the proximal left P2. 3. No hemodynamically significant stenosis in the neck. 4. Aortic atherosclerosis. Aortic Atherosclerosis (ICD10-I70.0). Electronically Signed  By: Donald Campion M.D.   On: 01/11/2024 19:54   CT Chest W Contrast Result Date: 01/11/2024 CLINICAL DATA:  Sepsis, patient found unresponsive. EXAM: CT CHEST WITH CONTRAST TECHNIQUE: Multidetector CT imaging of the chest was performed during intravenous contrast administration. RADIATION DOSE REDUCTION: This exam was performed according to the departmental dose-optimization program which includes automated exposure control, adjustment of the mA and/or kV according to patient size and/or use of iterative reconstruction technique. CONTRAST:  75mL OMNIPAQUE  IOHEXOL  350 MG/ML SOLN COMPARISON:  Chest CT 01/08/2023 and chest radiograph 01/11/2024 FINDINGS: Cardiovascular: Coronary, aortic arch, and branch vessel atherosclerotic vascular disease. Right atrial enlargement. Mediastinum/Nodes: Small type 1 hiatal hernia. Lungs/Pleura: Biapical pleuroparenchymal scarring. Faint tree-in-bud nodularity posteriorly in the right upper lobe on image 72 series 9. This is probably from atypical infectious bronchiolitis and is minimal. Tree-in-bud nodularity and associated cylindrical bronchiectasis anteromedially in the left upper lobe with some associated airway plugging in this vicinity  and in the lingula. Left lower lobe cylindrical bronchiectasis with scattered plugging and mild inflammatory nodularity is observed. This likely represents atypical infectious bronchiolitis and is mildly increased in the left lower lobe compared to last year. Upper Abdomen: Left renal atrophy. Small bilateral renal cysts. No further imaging workup of these lesions is indicated. Several small cystic lesions of the pancreas are observed, the largest in the tail the pancreas measures 1.1 by 1.1 by 1.1 cm on image 175 series 17. Possibilities include postinflammatory cystic lesion or intraductal papillary mucinous neoplasm. The prior CT chest did not extend so far down into the abdomen as to include this previously. Musculoskeletal: Degenerative sternoclavicular arthropathy, right greater than left. Thoracic kyphosis. IMPRESSION: 1. Tree-in-bud nodularity and associated cylindrical bronchiectasis in the left upper lobe, lingula, and left lower lobe, with some associated airway plugging. This likely represents atypical infectious bronchiolitis and is mildly increased in the left lower lobe compared to last year. 2. Coronary, aortic arch, and branch vessel atherosclerotic vascular disease. 3. Right atrial enlargement. 4. Small type 1 hiatal hernia. 5. Left renal atrophy. 6. Several small cystic lesions of the pancreas, the largest in the tail the pancreas measures 1.1 by 1.1 by 1.1 cm. Possibilities include postinflammatory cystic lesion or intraductal papillary mucinous neoplasm. Follow up pancreatic protocol CT or MRI is recommended in 2 years time. This recommendation follows ACR consensus guidelines: Management of Incidental Pancreatic Cysts: A White Paper of the ACR Incidental Findings Committee. J Am Coll Radiol 2017;14:911-923. 7. Degenerative sternoclavicular arthropathy, right greater than left. 8. Thoracic kyphosis. 9. Aortic atherosclerosis. Aortic Atherosclerosis (ICD10-I70.0). Electronically Signed   By:  Ryan Salvage M.D.   On: 01/11/2024 19:34   DG Chest Port 1 View Result Date: 01/11/2024 CLINICAL DATA:  Questionable sepsis. EXAM: PORTABLE CHEST 1 VIEW COMPARISON:  None Available. FINDINGS: Normal mediastinum and cardiac silhouette. Lungs are hyperinflated. Normal pulmonary vasculature. No evidence of effusion, infiltrate, or pneumothorax. No acute bony abnormality. IMPRESSION: 1. No acute cardiopulmonary process. 2. Hyperinflation. Electronically Signed   By: Jackquline Boxer M.D.   On: 01/11/2024 16:41    Scheduled Meds:  apixaban   2.5 mg Oral BID   insulin  aspart  0-5 Units Subcutaneous QHS   insulin  aspart  0-6 Units Subcutaneous TID WC   metoprolol  tartrate  25 mg Oral BID   sodium chloride  flush  3 mL Intravenous Q12H   Continuous Infusions:  sodium bicarbonate 150 mEq in sterile water  1,150 mL infusion 125 mL/hr at 01/12/24 0701   sodium chloride  Stopped (01/11/24 2127)  LOS: 1 day   Ivonne Mustache, MD [facility] P1/11/2024, 8:42 AM

## 2024-01-12 NOTE — Consult Note (Signed)
 Palliative Medicine Inpatient Consult Note  Consulting Provider: Jillian Buttery, MD   Reason for consult:   Question Answer  Palliative Care Consult Services Palliative Medicine Consult  Reason for Consult? Very large lady presented from home for the evaluation of poor oral intake, dehydration.  Recent history of COVID.  Appears significantly declined.  Very weak ,deconditioned and cachectic.  Also in A-fib with RVR.   01/12/2024  HPI:  Per intake H&P --> Patient is a 89 year old female with history of depression, anxiety, diabetes, hypertension, hyperlipidemia, A-fib on Eliquis , CKD stage IIIa, dementia who was brought to the emergency department after she was found to be unresponsive at home by her daughter.  Patient developed fatigue, cough, malaise for almost a month.  She was diagnosed with COVID on 1/1. Palliative care asked to get involved for further goals of care conversations in the setting of adult FTT and profound weakness.   Clinical Assessment/Goals of Care:  *Please note that this is a verbal dictation therefore any spelling or grammatical errors are due to the Dragon Medical One system interpretation.  I have reviewed medical records including EPIC notes, labs and imaging, received report from bedside RN, assessed the patient who is lying in bed somnolent.    I met with patients daughter,  [name] to further discuss diagnosis prognosis, GOC, EOL wishes, disposition and options.   I introduced Palliative Medicine as specialized medical care for people living with serious illness. It focuses on providing relief from the symptoms and stress of a serious illness. The goal is to improve quality of life for both the patient and the family.  Medical History Review and Understanding:  A review of patients history inclusive of her CKD, HTN, HLD, DM II, anxiety, depression, dementia, and fibromyalgia was completed.  Social History:  Febe lives in [place], [place] . She  has been married twice though both husbands are deceased. She has two grandsons and three great grandsons. She formerly worked at [employer] and then at [employer]. She is a woman of strong Christian faith and was one of the first members of [address] in [place].   Functional and Nutritional State:  Preceding Christmas, Madelina was able to get around and slowly assist with bADLs. Her appetite has been poor since Christmas. Her daughter speculates she was not feeling well as she was spending more and more time in her bed.   Advance Directives:  A detailed discussion was had today regarding advanced directives.  Patients daughter, [name] is her runner, broadcasting/film/video.   Code Status:  Concepts specific to code status, artifical feeding and hydration, continued IV antibiotics and rehospitalization was had.  The difference between a aggressive medical intervention path  and a palliative comfort care path for this patient at this time was had.   Aslee is an established DNAR/DNI code status.   Discussion:  [name] share with me that her mother has been in her bed more often since Christmas. She has had little appetite as well. [name] recently got COVID and shortly thereafter Keon was diagnosed with this. She has since then been continuing to get weaker. Jasimine was brought in after [name] notice she was exhibiting extreme fatigue yesterday.   I reviewed with [name] the concerns in the setting of patient present health. I shared that she appears to be encroaching end of life. I shared we can continue present measures though I worry they may not be effective. [name] understands.  We talked about transition to comfort  measures in house and what that would entail inclusive of medications to control pain, dyspnea, agitation, nausea, itching, and hiccups.  We discussed stopping all uneccessary measures such as cardiac monitoring, blood draws, needle sticks, and frequent vital signs.   I confirmed no  escalation of care to the ICU. If hypotension continues despite aggressive fluid resuscitative efforts the goals will transition to comfort care.   Plan at this time is to allow 24 hours for outcomes though if patient should worsen before then to allow comfort.   Discussed the importance of continued conversation with family and their  medical providers regarding overall plan of care and treatment options, ensuring decisions are within the context of the patients values and GOCs.  Decision Maker: [name] Pegram (Daughter): [phone]  SUMMARY OF RECOMMENDATIONS   DNAR/DNI  Open and honest conversations held in the setting of patients acute illness  Plan to allow 24 hours though if patient does not improve or her BP remains low despite present efforts to transition goals to comfort care --> Patients daughter aware she may acutely worsen overnight and would like to be called if so. She does not want her mother to suffer.  The PMT will continue to follow along and offer ongoing support  Code Status/Advance Care Planning: DNAR/DNI  Palliative Prophylaxis:  Aspiration, Bowel Regimen, Delirium Protocol, Frequent Pain Assessment, Oral Care, Palliative Wound Care, and Turn Reposition  Additional Recommendations (Limitations, Scope, Preferences): Continue current care  Psycho-social/Spiritual:  Desire for further Chaplaincy support: Patients pastor came Additional Recommendations: Education on critical illness and end of life stages   Prognosis: Quite limited overall  Discharge Planning: Unclear  Vitals:   01/12/24 1353 01/12/24 1432  BP: (!) 90/50 99/75  Pulse:  74  Resp:  18  Temp:    SpO2:  100%    Intake/Output Summary (Last 24 hours) at 01/12/2024 1512 Last data filed at 01/12/2024 0701 Gross per 24 hour  Intake 1262.44 ml  Output 450 ml  Net 812.44 ml   Gen:  Frail elderly Caucasian F in NAD HEENT: Dry mucous membranes CV: Regular rate and rhythm  PULM: On 2LPM [place],  breathing is even and nonlabored  ABD: soft/nontender  EXT: Cool distal extremities Neuro: Somnolent  PPS: 10%   This conversation/these recommendations were discussed with patient primary care team, Dr. Jillian  Billing based on MDM: High ______________________________________________________ [name] [facility] Palliative Medicine Team Team Cell Phone: [phone] Please utilize secure chat with additional questions, if there is no response within 30 minutes please call the above phone number  Palliative Medicine Team providers are available by phone from 7am to 7pm daily and can be reached through the team cell phone.  Should this patient require assistance outside of these hours, please call the patient's attending physician.

## 2024-01-12 NOTE — ED Notes (Signed)
 Update daughter [phone]

## 2024-01-12 NOTE — Plan of Care (Signed)
  Problem: Respiratory: Goal: Will maintain a patent airway Outcome: Progressing   Problem: Fluid Volume: Goal: Ability to maintain a balanced intake and output will improve Outcome: Progressing   Problem: Coping: Goal: Ability to adjust to condition or change in health will improve Outcome: Progressing

## 2024-01-13 DIAGNOSIS — N1832 Chronic kidney disease, stage 3b: Secondary | ICD-10-CM | POA: Diagnosis not present

## 2024-01-13 DIAGNOSIS — N179 Acute kidney failure, unspecified: Secondary | ICD-10-CM | POA: Diagnosis not present

## 2024-01-13 DIAGNOSIS — Z515 Encounter for palliative care: Secondary | ICD-10-CM | POA: Diagnosis not present

## 2024-01-13 DIAGNOSIS — Z7189 Other specified counseling: Secondary | ICD-10-CM | POA: Diagnosis not present

## 2024-01-13 LAB — GLUCOSE, CAPILLARY
Glucose-Capillary: 95 mg/dL (ref 70–99)
Glucose-Capillary: 123 mg/dL — ABNORMAL HIGH (ref 70–99)

## 2024-01-13 LAB — CBC
HCT: 43 % (ref 36.0–46.0)
Hemoglobin: 13.7 g/dL (ref 12.0–15.0)
MCH: 29.2 pg (ref 26.0–34.0)
MCHC: 31.9 g/dL (ref 30.0–36.0)
MCV: 91.7 fL (ref 80.0–100.0)
Platelets: 156 10*3/uL (ref 150–400)
RBC: 4.69 MIL/uL (ref 3.87–5.11)
RDW: 14.7 % (ref 11.5–15.5)
WBC: 15.3 10*3/uL — ABNORMAL HIGH (ref 4.0–10.5)
nRBC: 0 % (ref 0.0–0.2)

## 2024-01-13 LAB — BASIC METABOLIC PANEL
Anion gap: 15 (ref 5–15)
BUN: 87 mg/dL — ABNORMAL HIGH (ref 8–23)
CO2: 18 mmol/L — ABNORMAL LOW (ref 22–32)
Calcium: 8.5 mg/dL — ABNORMAL LOW (ref 8.9–10.3)
Chloride: 112 mmol/L — ABNORMAL HIGH (ref 98–111)
Creatinine, Ser: 1.35 mg/dL — ABNORMAL HIGH (ref 0.44–1.00)
GFR, Estimated: 36 mL/min — ABNORMAL LOW (ref >60)
Glucose, Bld: 103 mg/dL — ABNORMAL HIGH (ref 70–99)
Potassium: 3.8 mmol/L (ref 3.5–5.1)
Sodium: 145 mmol/L (ref 135–145)

## 2024-01-13 MED ORDER — BIOTENE DRY MOUTH MT LIQD: 15.0000 mL | OROMUCOSAL | Status: DC | PRN

## 2024-01-13 MED ORDER — GLYCOPYRROLATE 1 MG PO TABS: 1.0000 mg | ORAL_TABLET | ORAL | Status: DC | PRN

## 2024-01-13 MED ORDER — GLYCOPYRROLATE 0.2 MG/ML IJ SOLN: 0.2000 mg | INTRAMUSCULAR | Status: DC | PRN

## 2024-01-13 MED ORDER — ONDANSETRON 4 MG PO TBDP: 4.0000 mg | ORAL_TABLET | Freq: Four times a day (QID) | ORAL | Status: DC | PRN

## 2024-01-13 MED ORDER — POLYVINYL ALCOHOL 1.4 % OP SOLN: 1.0000 [drp] | Freq: Four times a day (QID) | OPHTHALMIC | Status: DC | PRN

## 2024-01-13 MED ORDER — ONDANSETRON HCL 4 MG/2ML IJ SOLN: 4.0000 mg | Freq: Four times a day (QID) | INTRAMUSCULAR | Status: DC | PRN

## 2024-01-13 MED ORDER — HYDROMORPHONE HCL 1 MG/ML IJ SOLN
0.5000 mg | INTRAMUSCULAR | Status: DC | PRN
  Administered 2024-01-13 – 2024-01-14 (×2): 0.5 mg via INTRAVENOUS
  Filled 2024-01-13 (×2): qty 0.5

## 2024-01-13 MED ORDER — LORAZEPAM 2 MG/ML IJ SOLN: 0.5000 mg | INTRAMUSCULAR | Status: DC | PRN

## 2024-01-13 NOTE — Plan of Care (Signed)
   Problem: Education: Goal: Knowledge of risk factors and measures for prevention of condition will improve Outcome: Progressing   Problem: Coping: Goal: Psychosocial and spiritual needs will be supported Outcome: Progressing

## 2024-01-13 NOTE — Evaluation (Signed)
 Clinical/Bedside Swallow Evaluation Patient Details  Name: MALIJAH LIETZ MRN: [id number] Date of Birth: [birth year]  Today's Date: 01/13/2024 Time: SLP Start Time (ACUTE ONLY): 9164 SLP Stop Time (ACUTE ONLY): 0849 SLP Time Calculation (min) (ACUTE ONLY): 14 min  Past Medical History:  Past Medical History:  Diagnosis Date   Abdominal pain 10/12/17   Allergy    sneeze, rhinorrhea in am   Anxiety state 09/03/2013   Breast mass, right 10/14/2012   Chicken pox as a child   Chronic pain syndrome 01/23/2015   Chronic renal insufficiency 03/17/2011   Colon cancer (HCC)    Complication of anesthesia    agitated when waking up, wakes up shaking, like  I'm going into shock   Constipation 10/12/2017   Depression    husband died  3 months ago, pt. admits depression  currently    Diabetic neuropathy (HCC) 03/17/2011   DM (diabetes mellitus) type 2, uncontrolled, with ketoacidosis (HCC) 03/17/2011   Ductal hyperplasia of breast 03/17/2011   Essential hypertension, benign 05/31/2010   Qualifier: Diagnosis of  By: Lavona, MD, CODY Lynwood Noel 07/15/2021   Fibromyalgia    GERD (gastroesophageal reflux disease)    H/O echocardiogram    done /w [place] in 2011, saw Dr. Lavona for tachycardia    Headache    History of colon cancer 03/17/2011   Hyperlipidemia    6 years   Hyperlipidemia, mixed    6 years   Hypertension    15 years, reports she has a rapid heartrate    Loss of weight 12/14/2013   Measles as a child   Osteoarthritis (arthritis due to wear and tear of joints) 03/17/2011   Noted diffusely including neck   Osteopenia 12/14/2013   Pain in finger of right hand 05/15/2017   Pain in joint, lower leg 09/03/2013   Pain of right shoulder region 07/07/2014   Peripheral neuropathy    SOB (shortness of breath) 05/31/2010   Qualifier: Diagnosis of  By: Lavona, MD, CODY Lynwood     Stress 06/01/2013   Tachycardia 05/31/2010   Qualifier: Diagnosis of  By: Lavona, MD, CODY Lynwood      Type 2 diabetes mellitus with peripheral neuropathy (HCC) 03/17/2011   Vitamin B 12 deficiency 07/07/2014   intrinsic factor positive   Past Surgical History:  Past Surgical History:  Procedure Laterality Date   BREAST EXCISIONAL BIOPSY Right 2013   benign   BREAST SURGERY  2013   right- benign   CATARACT EXTRACTION     /w IOL- bilateral    COLON SURGERY     For resection of cancer   Nodule removed     From throat   HPI:  AYOMIDE ZULETA is a 89 y.o. female with medical history significant for depression, anxiety, type 2 diabetes mellitus, hypertension, hyperlipidemia, atrial fibrillation on Eliquis , CKD 3B, and dementia who presents after being found unresponsive by family. Pt tested positive for COVID-19 on 01/01/2024. Per chart her cough has resolved but patient has lost her appetite and has become progressively more fatigued. CT is negative for acute abnormality, and CT chest demonstrates findings suggestive of atypical infection and are similar to CT 1 year ago. Started on antibiotics for possible sepsis secondary to pna. Palliative folowing.    Assessment / Plan / Recommendation  Clinical Impression  Due to pain (pt moaning) pt was in a more reclined position with head of bed upright as much as possible and use of reverse trendelenburg  position. She did not open mouth wide for inspection however family states she is missing some dentition. Her respiratory status at baseline is adequate. Daughter reports some difficulty taking pills where the water  will come back up and sometimes with just water . There was eructation at the end of the assessment and pt family deny recent difficulty with reflux. She has one delayed cough out of several straw sips thin. Slower mastication with graham cracker but able to masticate and clear oral cavity with liquid wash. Recommend continue regular texture as grandson reports he is ordering softer foods for pt, thin liquids, meds whole in puree. Educated family  to set head of bed as high as she will allow and how to place bed in reverse trendelenburg position. ST will continue to follow for safety and efficiency. SLP Visit Diagnosis: Dysphagia, unspecified (R13.10)    Aspiration Risk  Mild aspiration risk    Diet Recommendation Regular;Thin liquid    Liquid Administration via: Straw;Cup Medication Administration: Whole meds with puree Supervision: Staff to assist with self feeding;Full supervision/cueing for compensatory strategies Compensations: Slow rate;Small sips/bites Postural Changes: Seated upright at 90 degrees    Other  Recommendations Oral Care Recommendations: Oral care BID    Recommendations for follow up therapy are one component of a multi-disciplinary discharge planning process, led by the attending physician.  Recommendations may be updated based on patient status, additional functional criteria and insurance authorization.  Follow up Recommendations No SLP follow up      Assistance Recommended at Discharge    Functional Status Assessment Patient has had a recent decline in their functional status and demonstrates the ability to make significant improvements in function in a reasonable and predictable amount of time.  Frequency and Duration min 2x/week  2 weeks       Prognosis Prognosis for improved oropharyngeal function:  (fair-good)      Swallow Study   General Date of Onset: 01/11/24 HPI: KAYSEY BERNDT is a 89 y.o. female with medical history significant for depression, anxiety, type 2 diabetes mellitus, hypertension, hyperlipidemia, atrial fibrillation on Eliquis , CKD 3B, and dementia who presents after being found unresponsive by family. Pt tested positive for COVID-19 on 01/01/2024. Per chart her cough has resolved but patient has lost her appetite and has become progressively more fatigued. CT is negative for acute abnormality, and CT chest demonstrates findings suggestive of atypical infection and are similar to CT  1 year ago. Started on antibiotics for possible sepsis secondary to pna. Palliative folowing. Type of Study: Bedside Swallow Evaluation Previous Swallow Assessment:  (none) Diet Prior to this Study: Regular;Thin liquids (Level 0) Temperature Spikes Noted: No Respiratory Status: Nasal cannula History of Recent Intubation: No Behavior/Cognition: Alert;Requires cueing;Other (Comment) (in pain- doesn't want to sit up) Oral Cavity Assessment: Within Functional Limits Oral Care Completed by SLP: No Oral Cavity - Dentition: Missing dentition Vision: Functional for self-feeding Self-Feeding Abilities: Total assist Patient Positioning: Partially reclined Baseline Vocal Quality: Normal Volitional Cough: Weak    Oral/Motor/Sensory Function Overall Oral Motor/Sensory Function: Within functional limits   Ice Chips Ice chips: Not tested   Thin Liquid Thin Liquid: Impaired Presentation: Straw Pharyngeal  Phase Impairments: Throat Clearing - Delayed    Nectar Thick Nectar Thick Liquid: Not tested   Honey Thick Honey Thick Liquid: Not tested   Puree Puree: Not tested   Solid     Solid: Impaired Oral Phase Functional Implications: Other (comment) (prolonged mastication)      Dustin Olam Bull 01/13/2024,9:11 AM

## 2024-01-13 NOTE — Plan of Care (Signed)
  Problem: Education: Goal: Knowledge of risk factors and measures for prevention of condition will improve Outcome: Progressing   Problem: Respiratory: Goal: Will maintain a patent airway Outcome: Progressing Goal: Complications related to the disease process, condition or treatment will be avoided or minimized Outcome: Progressing   

## 2024-01-13 NOTE — Progress Notes (Signed)
 PROGRESS NOTE  Dana Burns  FMW:[id number] DOB: Feb 24, 1926 DOA: 01/11/2024 PCP: Domenica Harlene LABOR, MD   Brief Narrative: Patient is a 89 year old female with history of depression, anxiety, diabetes, hypertension, hyperlipidemia, A-fib on Eliquis , CKD stage IIIa, dementia who was brought to the emergency department after she was found to be unresponsive at home by her daughter.  Patient developed fatigue, cough, malaise for almost a month.  She was diagnosed with COVID on 1/1.  Cough has resolved.  She lost her appetite and was very weak/ fatigued,very poor oral intake.  On presentation,she was mildly tachypneic, bradycardic but blood  pressure was stable.  Chest x-ray did not show any acute findings.  CT head negative for acute findings.  CT chest showed possible atypical infection but findings are similar to previous CT about a year ago.  Labs are creatinine of 2, lactate of 5.8, WC count of 11.6.  Started on antibiotics for possible sepsis secondary pneumonia, started on IV fluids.  She also went into A-fib with RVR in the Emergency Department.  Remained agitated. Palliative care consulted for goals of care.  After discussion goals of care with family, her care transitioned to comfort.  Plan for transfer to [facility]  Assessment & Plan:  Principal Problem:   Acute renal failure superimposed on stage 3b chronic kidney disease (HCC) Active Problems:   Chronic atrial fibrillation with RVR (HCC)   Type 2 diabetes mellitus with peripheral neuropathy (HCC)   Depression with anxiety   Moderate dementia with psychotic disturbance (HCC)   Metabolic acidosis   Pancreatic lesion   Acute encephalopathy   AKI on CKD stage IIIb: Presented with creatinine in the range of 2.  Baseline creatinine around 1.3-1.5.  Likely associated with decreased oral intake.  Appeared  dehydrated on admission and started on IV fluids.  Currently kidney function stable and at baseline.  Currently on comfort care.  Acute  encephalopathy: No acute findings on CT.  History of dementia though.  Likely from dehydration, uremia.  Continue delirium precaution, frequent reorientation  Recent history of COVID 19: Likely contributing to dehydration,weakness,poor oral intake  On room air.  CT chest showed tree-in-bud nodularity and associated cylindrical bronchiectasis in the left upper lobe, lingula, and left lower lobe, with someassociated airway plugging suspicious for atypical infectious bronchiolitis.  Procalcitonin reassuring.  Family wants to continue antibiotics for today.  Elevated lactate/leukocytosis: Secondary to dehydration/ hemoconcentration .procalcitonin has been reassuring.   Chronic A-fib: On Eliquis  and metoprolol .  Metoprolol  on hold due to hypotension.  Type 2 diabetes: A1c of 6.5.  Currently on sliding scale.    Dementia/depression/anxiety: Continue supportive care.  Hold sedating medications as much as possible  Pancreatic lesions: Incidentally found to have cystic lesions on CT.   Goals of care: 89 year old female with history of dementia, currently living with daughter.  Ambulatory with walker.  Has significantly declined after recent COVID infection.  Has poor oral intake.  Now has AKI, agitation, encephalopathy, hypotension.  High suspicion for further decline.  Palliative care consulted for goals of care. Goals of care discussed extensively with daughter at bedside.  She understand her prognosis.  Patient is a DNR.  Family decided to transition her care to comfort, referral provided to [facility] place    Pressure Injury 01/12/24 Buttocks Stage 1 -  Intact skin with non-blanchable redness of a localized area usually over a bony prominence. (Active)  01/12/24 1151  Location: Buttocks  Location Orientation:   Staging: Stage 1 -  Intact  skin with non-blanchable redness of a localized area usually over a bony prominence.  Wound Description (Comments):   Present on Admission: Yes  Dressing Type Foam  - Lift dressing to assess site every shift 01/13/24 0730     Pressure Injury 01/12/24 Heel Left Deep Tissue Pressure Injury - Purple or maroon localized area of discolored intact skin or blood-filled blister due to damage of underlying soft tissue from pressure and/or shear. (Active)  01/12/24 1152  Location: Heel  Location Orientation: Left  Staging: Deep Tissue Pressure Injury - Purple or maroon localized area of discolored intact skin or blood-filled blister due to damage of underlying soft tissue from pressure and/or shear.  Wound Description (Comments):   Present on Admission: Yes  Dressing Type Foam - Lift dressing to assess site every shift 01/13/24 0730    DVT prophylaxis:     Code Status: Do not attempt resuscitation (DNR) - Comfort care  Family Communication: Daughter at bedside on 1/13  Patient status:Inpatient  Patient is from :home  Anticipated discharge un[facility]  Estimated DC date:tomorrow   Consultants: Palliative care  Procedures:None  Antimicrobials:  Anti-infectives (From admission, onward)    Start     Dose/Rate Route Frequency Ordered Stop   01/13/24 1000  azithromycin  (ZITHROMAX ) 500 mg in sodium chloride  0.9 % 250 mL IVPB  Status:  Discontinued        500 mg 250 mL/hr over 60 Minutes Intravenous Every 24 hours 01/12/24 1443 01/12/24 1506   01/13/24 1000  doxycycline  (VIBRAMYCIN ) 100 mg in dextrose  5 % 250 mL IVPB        100 mg 125 mL/hr over 120 Minutes Intravenous Every 12 hours 01/12/24 1506     01/12/24 1630  cefTRIAXone  (ROCEPHIN ) 2 g in sodium chloride  0.9 % 100 mL IVPB  Status:  Discontinued        2 g 200 mL/hr over 30 Minutes Intravenous Every 24 hours 01/11/24 2043 01/11/24 2110   01/12/24 1000  azithromycin  (ZITHROMAX ) tablet 500 mg  Status:  Discontinued        500 mg Oral Daily 01/12/24 0851 01/12/24 1443   01/12/24 0900  cefTRIAXone  (ROCEPHIN ) 2 g in sodium chloride  0.9 % 100 mL IVPB        2 g 200 mL/hr over 30 Minutes  Intravenous Every 24 hours 01/12/24 0850 01/16/24 0859   01/11/24 2045  doxycycline  (VIBRAMYCIN ) 100 mg in dextrose  5 % 250 mL IVPB  Status:  Discontinued        100 mg 125 mL/hr over 120 Minutes Intravenous Every 12 hours 01/11/24 2043 01/11/24 2110   01/11/24 1630  levofloxacin  (LEVAQUIN ) IVPB 750 mg  Status:  Discontinued        750 mg 100 mL/hr over 90 Minutes Intravenous  Once 01/11/24 1618 01/11/24 1621   01/11/24 1630  cefTRIAXone  (ROCEPHIN ) 2 g in sodium chloride  0.9 % 100 mL IVPB       Placed in Followed by Linked Group   2 g 200 mL/hr over 30 Minutes Intravenous  Once 01/11/24 1621 01/11/24 2126   01/11/24 1630  azithromycin  (ZITHROMAX ) 500 mg in sodium chloride  0.9 % 250 mL IVPB       Placed in Followed by Linked Group   500 mg 250 mL/hr over 60 Minutes Intravenous  Once 01/11/24 1621 01/11/24 2350       Subjective: Patient seen and examined at bedside today.  Blood pressure is stable today.  Heart rate well-controlled.  Family around bedside.  She looks better than yesterday.  More alert but still very weak and lethargic.  Lying in bed.  Confused She ate some food  Objective: Vitals:   01/13/24 0300 01/13/24 0731 01/13/24 1100 01/13/24 1200  BP: 115/80 109/63 125/71 (!) 142/81  Pulse: (!) 39  83 (!) 35  Resp: 17 (!) 22 16 (!) 26  Temp: (!) 96.7 F (35.9 C) 97.7 F (36.5 C)    TempSrc: Axillary Axillary Oral   SpO2: 100%  100% 100%  Weight:        Intake/Output Summary (Last 24 hours) at 01/13/2024 1309 Last data filed at 01/13/2024 0817 Gross per 24 hour  Intake 2603.51 ml  Output 500 ml  Net 2103.51 ml   Filed Weights   01/13/24 0118  Weight: 53.7 kg    Examination:  General exam: Very weak, deconditioned, cachectic HEENT: PERRL Respiratory system:  no wheezes or crackles, diminished bilateral breath sounds Cardiovascular system: Irregularly irregular rhythm.  Gastrointestinal system: Abdomen is nondistended, soft and nontender. Central nervous  system: Not alert or oriented Extremities: No edema, no clubbing ,no cyanosis Skin: No rashes, no ulcers,no icterus     Data Reviewed: I have personally reviewed following labs and imaging studies  CBC: Recent Labs  Lab 01/11/24 1617 01/11/24 1632 01/12/24 0438 01/13/24 0713  WBC 11.6*  --  19.6* 15.3*  NEUTROABS 8.0*  --   --   --   HGB 17.3* 18.0* 15.8* 13.7  HCT 55.6* 53.0* 48.3* 43.0  MCV 92.4  --  89.8 91.7  PLT 281  --  253 156   Basic Metabolic Panel: Recent Labs  Lab 01/11/24 1617 01/11/24 1632 01/12/24 0438 01/13/24 0318  NA 146* 140 143 145  K 3.9 5.1 3.8 3.8  CL 111 125* 109 112*  CO2 15*  --  23 18*  GLUCOSE 263* 231* 208* 103*  BUN 119* 127* 111* 87*  CREATININE 2.02* 1.90* 1.91* 1.35*  CALCIUM 9.8  --  9.1 8.5*  MG  --   --  2.6*  --   PHOS  --   --  2.7  --      Recent Results (from the past 240 hours)  Resp panel by RT-PCR (RSV, Flu A&B, Covid) Anterior Nasal Swab     Status: Abnormal   Collection Time: 01/11/24  4:17 PM   Specimen: Anterior Nasal Swab  Result Value Ref Range Status   SARS Coronavirus 2 by RT PCR POSITIVE (A) NEGATIVE Final   Influenza A by PCR NEGATIVE NEGATIVE Final   Influenza B by PCR NEGATIVE NEGATIVE Final    Comment: (NOTE) The Xpert Xpress SARS-CoV-2/FLU/RSV plus assay is intended as an aid in the diagnosis of influenza from Nasopharyngeal swab specimens and should not be used as a sole basis for treatment. Nasal washings and aspirates are unacceptable for Xpert Xpress SARS-CoV-2/FLU/RSV testing.  Fact Sheet for Patients: bloggercourse.com  Fact Sheet for Healthcare Providers: seriousbroker.it  This test is not yet approved or cleared by the [place]  FDA and has been authorized for detection and/or diagnosis of SARS-CoV-2 by FDA under an Emergency Use Authorization (EUA). This EUA will remain in effect (meaning this test can be used) for the duration of  the COVID-19 declaration under Section 564(b)(1) of the Act, 21 U.S.C. section 360bbb-3(b)(1), unless the authorization is terminated or revoked.     Resp Syncytial Virus by PCR NEGATIVE NEGATIVE Final    Comment: (NOTE) Fact Sheet for Patients: bloggercourse.com  Fact Sheet for Healthcare  Providers: seriousbroker.it  This test is not yet approved or cleared by the [place]  FDA and has been authorized for detection and/or diagnosis of SARS-CoV-2 by FDA under an Emergency Use Authorization (EUA). This EUA will remain in effect (meaning this test can be used) for the duration of the COVID-19 declaration under Section 564(b)(1) of the Act, 21 U.S.C. section 360bbb-3(b)(1), unless the authorization is terminated or revoked.  Performed at [facility], [address] [place], [place] [address]   Blood Culture (routine x 2)     Status: None (Preliminary result)   Collection Time: 01/11/24  4:30 PM   Specimen: BLOOD LEFT HAND  Result Value Ref Range Status   Specimen Description BLOOD LEFT HAND  Final   Special Requests   Final    BOTTLES DRAWN AEROBIC ONLY Blood Culture results may not be optimal due to an inadequate volume of blood received in culture bottles   Culture   Final    NO GROWTH 2 DAYS Performed at [facility], [address] [place], [place] [address]    Report Status PENDING  Incomplete  Blood Culture (routine x 2)     Status: None (Preliminary result)   Collection Time: 01/11/24  6:30 PM   Specimen: BLOOD  Result Value Ref Range Status   Specimen Description BLOOD SITE NOT SPECIFIED  Final   Special Requests   Final    BOTTLES DRAWN AEROBIC AND ANAEROBIC Blood Culture results may not be optimal due to an inadequate volume of blood received in culture bottles   Culture   Final    NO GROWTH 2 DAYS Performed at [facility], [address] [place], [place] [address]    Report Status  PENDING  Incomplete     Radiology Studies: CT ANGIO HEAD NECK W WO CM Result Date: 01/11/2024 CLINICAL DATA:  Found unresponsive EXAM: CT ANGIOGRAPHY HEAD AND NECK WITH AND WITHOUT CONTRAST TECHNIQUE: Multidetector CT imaging of the head and neck was performed using the standard protocol during bolus administration of intravenous contrast. Multiplanar CT image reconstructions and MIPs were obtained to evaluate the vascular anatomy. Carotid stenosis measurements (when applicable) are obtained utilizing NASCET criteria, using the distal internal carotid diameter as the denominator. RADIATION DOSE REDUCTION: This exam was performed according to the departmental dose-optimization program which includes automated exposure control, adjustment of the mA and/or kV according to patient size and/or use of iterative reconstruction technique. CONTRAST:  75mL OMNIPAQUE  IOHEXOL  350 MG/ML SOLN COMPARISON:  No prior CTA available, correlation is made with 08/05/2023 CT head FINDINGS: CT HEAD FINDINGS Brain: No evidence of acute infarct, hemorrhage, mass, mass effect, or midline shift. No hydrocephalus or extra-axial fluid collection. Age related cerebral atrophy. Vascular: No hyperdense vessel. Skull: Negative for fracture or focal lesion. Sinuses/Orbits: Mucosal thickening in the maxillary sinuses and ethmoid air cells. Status post bilateral lens replacements. Other: The mastoid air cells are well aerated. CTA NECK FINDINGS Aortic arch: Standard branching. Imaged portion shows no evidence of aneurysm or dissection. No significant stenosis of the major arch vessel origins. Aortic atherosclerosis. Right carotid system: No evidence of dissection, occlusion, or hemodynamically significant stenosis (greater than 50%). Left carotid system: No evidence of dissection, occlusion, or hemodynamically significant stenosis (greater than 50%). Vertebral arteries: No evidence of dissection, occlusion, or hemodynamically significant stenosis  (greater than 50%). Skeleton: No acute osseous abnormality. Degenerative changes in the cervical spine. Other neck: No acute finding. Upper chest: No focal pulmonary opacity or  pleural effusion. Review of the MIP images confirms the above findings CTA HEAD FINDINGS Anterior circulation: Both internal carotid arteries are patent to the termini, without significant stenosis. A1 segments patent. Normal anterior communicating artery. Anterior cerebral arteries are patent to their distal aspects without significant stenosis. No M1 stenosis or occlusion. MCA branches perfused to their distal aspects without significant stenosis. Posterior circulation: Vertebral arteries patent to the vertebrobasilar junction without significant stenosis. Posterior inferior cerebellar arteries patent proximally. Basilar patent to its distal aspect without significant stenosis. Redemonstration in the proximal basilar. Superior cerebellar arteries patent proximally. Patent left P1, with mild stenosis proximally (series 9, image 92). Aplastic right P1. Fetal origin of the right PCA from the right posterior communicating artery, with severe stenosis in the distal right posterior communicating artery (series 9, image 95). Severe stenosis in the proximal right P2 (series 9, image 95) and mild stenosis in the distal right P2 (series 9, image 96). Moderate stenosis in the proximal left P (series 9, images 95-96). Mild-to-moderate stenosis in the distal left P2 (series 9, image 98). Venous sinuses: As permitted by contrast timing, patent. Anatomic variants: Fetal origin of the right PCA. No evidence of aneurysm or vascular malformation. Review of the MIP images confirms the above findings IMPRESSION: 1. No acute intracranial process. 2. No intracranial large vessel occlusion. Severe stenosis in the distal right posterior communicating artery and proximal right P2, and moderate stenosis in the proximal left P2. 3. No hemodynamically significant  stenosis in the neck. 4. Aortic atherosclerosis. Aortic Atherosclerosis (ICD10-I70.0). Electronically Signed   By: Donald Campion M.D.   On: 01/11/2024 19:54   CT Chest W Contrast Result Date: 01/11/2024 CLINICAL DATA:  Sepsis, patient found unresponsive. EXAM: CT CHEST WITH CONTRAST TECHNIQUE: Multidetector CT imaging of the chest was performed during intravenous contrast administration. RADIATION DOSE REDUCTION: This exam was performed according to the departmental dose-optimization program which includes automated exposure control, adjustment of the mA and/or kV according to patient size and/or use of iterative reconstruction technique. CONTRAST:  75mL OMNIPAQUE  IOHEXOL  350 MG/ML SOLN COMPARISON:  Chest CT 01/08/2023 and chest radiograph 01/11/2024 FINDINGS: Cardiovascular: Coronary, aortic arch, and branch vessel atherosclerotic vascular disease. Right atrial enlargement. Mediastinum/Nodes: Small type 1 hiatal hernia. Lungs/Pleura: Biapical pleuroparenchymal scarring. Faint tree-in-bud nodularity posteriorly in the right upper lobe on image 72 series 9. This is probably from atypical infectious bronchiolitis and is minimal. Tree-in-bud nodularity and associated cylindrical bronchiectasis anteromedially in the left upper lobe with some associated airway plugging in this vicinity and in the lingula. Left lower lobe cylindrical bronchiectasis with scattered plugging and mild inflammatory nodularity is observed. This likely represents atypical infectious bronchiolitis and is mildly increased in the left lower lobe compared to last year. Upper Abdomen: Left renal atrophy. Small bilateral renal cysts. No further imaging workup of these lesions is indicated. Several small cystic lesions of the pancreas are observed, the largest in the tail the pancreas measures 1.1 by 1.1 by 1.1 cm on image 175 series 17. Possibilities include postinflammatory cystic lesion or intraductal papillary mucinous neoplasm. The prior CT  chest did not extend so far down into the abdomen as to include this previously. Musculoskeletal: Degenerative sternoclavicular arthropathy, right greater than left. Thoracic kyphosis. IMPRESSION: 1. Tree-in-bud nodularity and associated cylindrical bronchiectasis in the left upper lobe, lingula, and left lower lobe, with some associated airway plugging. This likely represents atypical infectious bronchiolitis and is mildly increased in the left lower lobe compared to last year. 2. Coronary, aortic arch, and  branch vessel atherosclerotic vascular disease. 3. Right atrial enlargement. 4. Small type 1 hiatal hernia. 5. Left renal atrophy. 6. Several small cystic lesions of the pancreas, the largest in the tail the pancreas measures 1.1 by 1.1 by 1.1 cm. Possibilities include postinflammatory cystic lesion or intraductal papillary mucinous neoplasm. Follow up pancreatic protocol CT or MRI is recommended in 2 years time. This recommendation follows ACR consensus guidelines: Management of Incidental Pancreatic Cysts: A White Paper of the ACR Incidental Findings Committee. J Am Coll Radiol 2017;14:911-923. 7. Degenerative sternoclavicular arthropathy, right greater than left. 8. Thoracic kyphosis. 9. Aortic atherosclerosis. Aortic Atherosclerosis (ICD10-I70.0). Electronically Signed   By: Ryan Salvage M.D.   On: 01/11/2024 19:34   DG Chest Port 1 View Result Date: 01/11/2024 CLINICAL DATA:  Questionable sepsis. EXAM: PORTABLE CHEST 1 VIEW COMPARISON:  None Available. FINDINGS: Normal mediastinum and cardiac silhouette. Lungs are hyperinflated. Normal pulmonary vasculature. No evidence of effusion, infiltrate, or pneumothorax. No acute bony abnormality. IMPRESSION: 1. No acute cardiopulmonary process. 2. Hyperinflation. Electronically Signed   By: Jackquline Boxer M.D.   On: 01/11/2024 16:41    Scheduled Meds:   Continuous Infusions:  cefTRIAXone  (ROCEPHIN )  IV 2 g (01/13/24 0920)   doxycycline   (VIBRAMYCIN ) IV 100 mg (01/13/24 1004)     LOS: 2 days   Ivonne Mustache, MD [facility] P1/13/2025, 1:09 PM

## 2024-01-13 NOTE — Progress Notes (Signed)
 Palliative Medicine Inpatient Follow Up Note HPI: Patient is a 89 year old female with history of depression, anxiety, diabetes, hypertension, hyperlipidemia, A-fib on Eliquis , CKD stage IIIa, dementia who was brought to the emergency department after she was found to be unresponsive at home by her daughter.  Patient developed fatigue, cough, malaise for almost a month.  She was diagnosed with COVID on 1/1. Palliative care asked to get involved for further goals of care conversations in the setting of adult FTT and profound weakness.   Today's Discussion 01/13/2024  *Please note that this is a verbal dictation therefore any spelling or grammatical errors are due to the Dragon Medical One system interpretation.  Chart reviewed inclusive of vital signs, progress notes, laboratory results, and diagnostic images.   I met with Dana Burns at bedside this late morning in the company of her daughter, Dana Burns and grandson Dana Burns. We discussed that patients family are very much in recognition that Dana Burns is approaching end of life. They do not feel giving additional IVF's are beneficial. They want her to pass naturally when it is her time. I discuss stopping IV antibiotics as well though patients daughter would like for patient to have these for at least two more days.  Created space and opportunity for patients daughter and grandson to explore thoughts feelings and fears regarding Dana Burns current medical situation.  Discussed transition to comfort measures that once transitioned to comfort the patient would no longer receive aggressive medical interventions such as continuous vital signs, lab work, radiology testing, or medications not focused on comfort. All care would focus on how the patient is looking and feeling. This would include management of any symptoms that may cause discomfort, pain, shortness of breath, cough, nausea, agitation, anxiety, and/or secretions etc. Symptoms would be managed with medications  and other non-pharmacological intervention.   Family verbalized understanding and appreciation. They are interested in learning more about hospice services. I shared I would request the liaison come to bedside to speak with them.   Questions and concerns addressed/Palliative Support Provided.   Objective Assessment: Vital Signs Vitals:   01/13/24 1100 01/13/24 1200  BP: 125/71 (!) 142/81  Pulse: 83 (!) 35  Resp: 16 (!) 26  Temp:    SpO2: 100% 100%    Intake/Output Summary (Last 24 hours) at 01/13/2024 1436 Last data filed at 01/13/2024 9182 Gross per 24 hour  Intake 2603.51 ml  Output 500 ml  Net 2103.51 ml   Last Weight  Most recent update: 01/13/2024  1:18 AM    Weight  53.7 kg (118 lb 6.2 oz)            Gen:  Frail elderly Caucasian F in NAD HEENT: Dry mucous membranes CV: Regular rate and rhythm  PULM: On 2LPM Arjay, breathing is even and nonlabored  ABD: soft/nontender  EXT: Cool distal extremities Neuro: Somnolent  SUMMARY OF RECOMMENDATIONS   DNAR/DNI  Continue antibiotics - Patients daughter would like them continued for another two days  Comfort measures   Additional medications per [facility]  Allow patient a natural passing  Appreciate evaluation by [name]  Ongoing PMT support  Billing based on MDM: High ______________________________________________________________________________________ Dana Burns Palliative Medicine Team Team Cell Phone: [phone] Please utilize secure chat with additional questions, if there is no response within 30 minutes please call the above phone number  Palliative Medicine Team providers are available by phone from 7am to 7pm daily and can be reached through the team cell phone.  Should this  patient require assistance outside of these hours, please call the patient's attending physician.

## 2024-01-14 DIAGNOSIS — N179 Acute kidney failure, unspecified: Secondary | ICD-10-CM | POA: Diagnosis not present

## 2024-01-14 DIAGNOSIS — Z515 Encounter for palliative care: Secondary | ICD-10-CM | POA: Diagnosis not present

## 2024-01-14 DIAGNOSIS — N1832 Chronic kidney disease, stage 3b: Secondary | ICD-10-CM | POA: Diagnosis not present

## 2024-01-14 DIAGNOSIS — Z7189 Other specified counseling: Secondary | ICD-10-CM | POA: Diagnosis not present

## 2024-01-14 NOTE — Discharge Summary (Signed)
 Physician Discharge Summary  Dana Burns FMW:[id number] DOB: 1926-03-24 DOA: 01/11/2024  PCP: Domenica Harlene LABOR, MD  Admit date: 01/11/2024 Discharge date: 01/14/2024  Admitted From: Home Disposition:  Home  Discharge Condition:Stable CODE STATUS:Comfort Care Diet recommendation:  Regular  Brief/Interim Summary: Patient is a 89 year old female with history of depression, anxiety, diabetes, hypertension, hyperlipidemia, A-fib on Eliquis , CKD stage IIIa, dementia who was brought to the emergency department after she was found to be unresponsive at home by her daughter.  Patient developed fatigue, cough, malaise for almost a month.  She was diagnosed with COVID on 1/1.  Cough has resolved.  She lost her appetite and was very weak/ fatigued,very poor oral intake.  On presentation,she was mildly tachypneic, bradycardic but blood  pressure was stable.  Chest x-ray did not show any acute findings.  CT head negative for acute findings.  CT chest showed possible atypical infection but findings are similar to previous CT about a year ago.  Labs showed  creatinine of 2, lactate of 5.8, WC count of 11.6.  Started on antibiotics for possible sepsis secondary pneumonia, started on IV fluids.  She also went into A-fib with RVR in the Emergency Department.  Remained agitated. Palliative care consulted for goals of care.  After discussion goals of care with family, her care transitioned to comfort.  Plan for transfer to [facility]     Discharge Diagnoses:  Principal Problem:   Acute renal failure superimposed on stage 3b chronic kidney disease (HCC) Active Problems:   Chronic atrial fibrillation with RVR (HCC)   Type 2 diabetes mellitus with peripheral neuropathy (HCC)   Depression with anxiety   Moderate dementia with psychotic disturbance (HCC)   Metabolic acidosis   Pancreatic lesion   Acute encephalopathy    Discharge Instructions  Discharge Instructions     No wound care   Complete by: As  directed       Allergies as of 01/14/2024       Reactions   Tape Itching, Rash   Actos [pioglitazone Hydrochloride] Other (See Comments)   Unknown reaction   Buspar [buspirone Hcl] Other (See Comments)   Unknown reaction   Doxycycline  Other (See Comments)   Made the patient feel funny- in a negative way   Lipitor [atorvastatin Calcium] Other (See Comments)   It made me hurt all over.   Penicillins Rash   Seroquel  [quetiapine ] Rash        Medication List     STOP taking these medications    acetaminophen  500 MG tablet Commonly known as: TYLENOL    apixaban  2.5 MG Tabs tablet Commonly known as: ELIQUIS    diazepam  5 MG tablet Commonly known as: VALIUM    gabapentin  800 MG tablet Commonly known as: NEURONTIN    metoprolol  tartrate 25 MG tablet Commonly known as: LOPRESSOR    rOPINIRole  1 MG tablet Commonly known as: REQUIP    tiZANidine  4 MG tablet Commonly known as: ZANAFLEX    venlafaxine  XR 37.5 MG 24 hr capsule Commonly known as: EFFEXOR -XR   Vitamin D3 50 MCG (2000 UT) capsule        Allergies  Allergen Reactions   Tape Itching and Rash   Actos [Pioglitazone Hydrochloride] Other (See Comments)    Unknown reaction   Buspar [Buspirone Hcl] Other (See Comments)    Unknown reaction   Doxycycline  Other (See Comments)    Made the patient feel funny- in a negative way   Lipitor [Atorvastatin Calcium] Other (See Comments)    It made me hurt  all over.   Penicillins Rash   Seroquel  [Quetiapine ] Rash    Consultations: Palliative care   Procedures/Studies: CT ANGIO HEAD NECK W WO CM Result Date: 01/11/2024 CLINICAL DATA:  Found unresponsive EXAM: CT ANGIOGRAPHY HEAD AND NECK WITH AND WITHOUT CONTRAST TECHNIQUE: Multidetector CT imaging of the head and neck was performed using the standard protocol during bolus administration of intravenous contrast. Multiplanar CT image reconstructions and MIPs were obtained to evaluate the vascular anatomy.  Carotid stenosis measurements (when applicable) are obtained utilizing NASCET criteria, using the distal internal carotid diameter as the denominator. RADIATION DOSE REDUCTION: This exam was performed according to the departmental dose-optimization program which includes automated exposure control, adjustment of the mA and/or kV according to patient size and/or use of iterative reconstruction technique. CONTRAST:  75mL OMNIPAQUE  IOHEXOL  350 MG/ML SOLN COMPARISON:  No prior CTA available, correlation is made with 08/05/2023 CT head FINDINGS: CT HEAD FINDINGS Brain: No evidence of acute infarct, hemorrhage, mass, mass effect, or midline shift. No hydrocephalus or extra-axial fluid collection. Age related cerebral atrophy. Vascular: No hyperdense vessel. Skull: Negative for fracture or focal lesion. Sinuses/Orbits: Mucosal thickening in the maxillary sinuses and ethmoid air cells. Status post bilateral lens replacements. Other: The mastoid air cells are well aerated. CTA NECK FINDINGS Aortic arch: Standard branching. Imaged portion shows no evidence of aneurysm or dissection. No significant stenosis of the major arch vessel origins. Aortic atherosclerosis. Right carotid system: No evidence of dissection, occlusion, or hemodynamically significant stenosis (greater than 50%). Left carotid system: No evidence of dissection, occlusion, or hemodynamically significant stenosis (greater than 50%). Vertebral arteries: No evidence of dissection, occlusion, or hemodynamically significant stenosis (greater than 50%). Skeleton: No acute osseous abnormality. Degenerative changes in the cervical spine. Other neck: No acute finding. Upper chest: No focal pulmonary opacity or pleural effusion. Review of the MIP images confirms the above findings CTA HEAD FINDINGS Anterior circulation: Both internal carotid arteries are patent to the termini, without significant stenosis. A1 segments patent. Normal anterior communicating artery.  Anterior cerebral arteries are patent to their distal aspects without significant stenosis. No M1 stenosis or occlusion. MCA branches perfused to their distal aspects without significant stenosis. Posterior circulation: Vertebral arteries patent to the vertebrobasilar junction without significant stenosis. Posterior inferior cerebellar arteries patent proximally. Basilar patent to its distal aspect without significant stenosis. Redemonstration in the proximal basilar. Superior cerebellar arteries patent proximally. Patent left P1, with mild stenosis proximally (series 9, image 92). Aplastic right P1. Fetal origin of the right PCA from the right posterior communicating artery, with severe stenosis in the distal right posterior communicating artery (series 9, image 95). Severe stenosis in the proximal right P2 (series 9, image 95) and mild stenosis in the distal right P2 (series 9, image 96). Moderate stenosis in the proximal left P (series 9, images 95-96). Mild-to-moderate stenosis in the distal left P2 (series 9, image 98). Venous sinuses: As permitted by contrast timing, patent. Anatomic variants: Fetal origin of the right PCA. No evidence of aneurysm or vascular malformation. Review of the MIP images confirms the above findings IMPRESSION: 1. No acute intracranial process. 2. No intracranial large vessel occlusion. Severe stenosis in the distal right posterior communicating artery and proximal right P2, and moderate stenosis in the proximal left P2. 3. No hemodynamically significant stenosis in the neck. 4. Aortic atherosclerosis. Aortic Atherosclerosis (ICD10-I70.0). Electronically Signed   By: Donald Campion M.D.   On: 01/11/2024 19:54   CT Chest W Contrast Result Date: 01/11/2024 CLINICAL DATA:  Sepsis, patient found unresponsive. EXAM: CT CHEST WITH CONTRAST TECHNIQUE: Multidetector CT imaging of the chest was performed during intravenous contrast administration. RADIATION DOSE REDUCTION: This exam was  performed according to the departmental dose-optimization program which includes automated exposure control, adjustment of the mA and/or kV according to patient size and/or use of iterative reconstruction technique. CONTRAST:  75mL OMNIPAQUE  IOHEXOL  350 MG/ML SOLN COMPARISON:  Chest CT 01/08/2023 and chest radiograph 01/11/2024 FINDINGS: Cardiovascular: Coronary, aortic arch, and branch vessel atherosclerotic vascular disease. Right atrial enlargement. Mediastinum/Nodes: Small type 1 hiatal hernia. Lungs/Pleura: Biapical pleuroparenchymal scarring. Faint tree-in-bud nodularity posteriorly in the right upper lobe on image 72 series 9. This is probably from atypical infectious bronchiolitis and is minimal. Tree-in-bud nodularity and associated cylindrical bronchiectasis anteromedially in the left upper lobe with some associated airway plugging in this vicinity and in the lingula. Left lower lobe cylindrical bronchiectasis with scattered plugging and mild inflammatory nodularity is observed. This likely represents atypical infectious bronchiolitis and is mildly increased in the left lower lobe compared to last year. Upper Abdomen: Left renal atrophy. Small bilateral renal cysts. No further imaging workup of these lesions is indicated. Several small cystic lesions of the pancreas are observed, the largest in the tail the pancreas measures 1.1 by 1.1 by 1.1 cm on image 175 series 17. Possibilities include postinflammatory cystic lesion or intraductal papillary mucinous neoplasm. The prior CT chest did not extend so far down into the abdomen as to include this previously. Musculoskeletal: Degenerative sternoclavicular arthropathy, right greater than left. Thoracic kyphosis. IMPRESSION: 1. Tree-in-bud nodularity and associated cylindrical bronchiectasis in the left upper lobe, lingula, and left lower lobe, with some associated airway plugging. This likely represents atypical infectious bronchiolitis and is mildly increased  in the left lower lobe compared to last year. 2. Coronary, aortic arch, and branch vessel atherosclerotic vascular disease. 3. Right atrial enlargement. 4. Small type 1 hiatal hernia. 5. Left renal atrophy. 6. Several small cystic lesions of the pancreas, the largest in the tail the pancreas measures 1.1 by 1.1 by 1.1 cm. Possibilities include postinflammatory cystic lesion or intraductal papillary mucinous neoplasm. Follow up pancreatic protocol CT or MRI is recommended in 2 years time. This recommendation follows ACR consensus guidelines: Management of Incidental Pancreatic Cysts: A White Paper of the ACR Incidental Findings Committee. J Am Coll Radiol 2017;14:911-923. 7. Degenerative sternoclavicular arthropathy, right greater than left. 8. Thoracic kyphosis. 9. Aortic atherosclerosis. Aortic Atherosclerosis (ICD10-I70.0). Electronically Signed   By: Ryan Salvage M.D.   On: 01/11/2024 19:34   DG Chest Port 1 View Result Date: 01/11/2024 CLINICAL DATA:  Questionable sepsis. EXAM: PORTABLE CHEST 1 VIEW COMPARISON:  None Available. FINDINGS: Normal mediastinum and cardiac silhouette. Lungs are hyperinflated. Normal pulmonary vasculature. No evidence of effusion, infiltrate, or pneumothorax. No acute bony abnormality. IMPRESSION: 1. No acute cardiopulmonary process. 2. Hyperinflation. Electronically Signed   By: Jackquline Boxer M.D.   On: 01/11/2024 16:41      Subjective: Patient seen and examined at the bedside today.  She is on full comfort care.  Obtunded, unresponsive, lying on bed.  Appears comfortable.  Family at bedside  Discharge Exam: Vitals:   01/13/24 2222 01/14/24 0743  BP: 120/81 127/84  Pulse: 99   Resp: 18 19  Temp: 97.8 F (36.6 C) 97.7 F (36.5 C)  SpO2: 98% 100%   Vitals:   01/13/24 1100 01/13/24 1200 01/13/24 2222 01/14/24 0743  BP: 125/71 (!) 142/81 120/81 127/84  Pulse: 83 (!) 35 99   Resp: 16 (!)  26 18 19   Temp:   97.8 F (36.6 C) 97.7 F (36.5 C)  TempSrc:  Oral  Oral Oral  SpO2: 100% 100% 98% 100%  Weight:        General: Pt on full comfort care, lying in bed, eyes closed, unresponsive   The results of significant diagnostics from this hospitalization (including imaging, microbiology, ancillary and laboratory) are listed below for reference.     Microbiology: Recent Results (from the past 240 hours)  Resp panel by RT-PCR (RSV, Flu A&B, Covid) Anterior Nasal Swab     Status: Abnormal   Collection Time: 01/11/24  4:17 PM   Specimen: Anterior Nasal Swab  Result Value Ref Range Status   SARS Coronavirus 2 by RT PCR POSITIVE (A) NEGATIVE Final   Influenza A by PCR NEGATIVE NEGATIVE Final   Influenza B by PCR NEGATIVE NEGATIVE Final    Comment: (NOTE) The Xpert Xpress SARS-CoV-2/FLU/RSV plus assay is intended as an aid in the diagnosis of influenza from Nasopharyngeal swab specimens and should not be used as a sole basis for treatment. Nasal washings and aspirates are unacceptable for Xpert Xpress SARS-CoV-2/FLU/RSV testing.  Fact Sheet for Patients: bloggercourse.com  Fact Sheet for Healthcare Providers: seriousbroker.it  This test is not yet approved or cleared by the [place]  FDA and has been authorized for detection and/or diagnosis of SARS-CoV-2 by FDA under an Emergency Use Authorization (EUA). This EUA will remain in effect (meaning this test can be used) for the duration of the COVID-19 declaration under Section 564(b)(1) of the Act, 21 U.S.C. section 360bbb-3(b)(1), unless the authorization is terminated or revoked.     Resp Syncytial Virus by PCR NEGATIVE NEGATIVE Final    Comment: (NOTE) Fact Sheet for Patients: bloggercourse.com  Fact Sheet for Healthcare Providers: seriousbroker.it  This test is not yet approved or cleared by the [place]  FDA and has been authorized for detection and/or diagnosis of  SARS-CoV-2 by FDA under an Emergency Use Authorization (EUA). This EUA will remain in effect (meaning this test can be used) for the duration of the COVID-19 declaration under Section 564(b)(1) of the Act, 21 U.S.C. section 360bbb-3(b)(1), unless the authorization is terminated or revoked.  Performed at [facility], [address], [place], [place] [address]   Blood Culture (routine x 2)     Status: None (Preliminary result)   Collection Time: 01/11/24  4:30 PM   Specimen: BLOOD LEFT HAND  Result Value Ref Range Status   Specimen Description BLOOD LEFT HAND  Final   Special Requests   Final    BOTTLES DRAWN AEROBIC ONLY Blood Culture results may not be optimal due to an inadequate volume of blood received in culture bottles   Culture   Final    NO GROWTH 3 DAYS Performed at [facility], [address], [place], [place] [address]    Report Status PENDING  Incomplete  Blood Culture (routine x 2)     Status: None (Preliminary result)   Collection Time: 01/11/24  6:30 PM   Specimen: BLOOD  Result Value Ref Range Status   Specimen Description BLOOD SITE NOT SPECIFIED  Final   Special Requests   Final    BOTTLES DRAWN AEROBIC AND ANAEROBIC Blood Culture results may not be optimal due to an inadequate volume of blood received in culture bottles   Culture   Final    NO GROWTH 3 DAYS Performed at [facility], [address], [place], [place]  [address]    Report Status PENDING  Incomplete     Labs: BNP (last 3 results) Recent Labs    03/27/23 1440 04/04/23 1606 08/05/23 2252  BNP 644.6* 559.3* 273.7*   Basic Metabolic Panel: Recent Labs  Lab 01/11/24 1617 01/11/24 1632 01/12/24 0438 01/13/24 0318  NA 146* 140 143 145  K 3.9 5.1 3.8 3.8  CL 111 125* 109 112*  CO2 15*  --  23 18*  GLUCOSE 263* 231* 208* 103*  BUN 119* 127* 111* 87*  CREATININE 2.02* 1.90* 1.91* 1.35*  CALCIUM 9.8  --  9.1 8.5*  MG  --   --  2.6*  --   PHOS  --   --  2.7  --     Liver Function Tests: Recent Labs  Lab 01/11/24 1617 01/12/24 0438  AST 42* 32  ALT 18 16  ALKPHOS 51 47  BILITOT 1.0 0.9  PROT 6.6 5.8*  ALBUMIN 3.1* 2.8*   No results for input(s): LIPASE, AMYLASE in the last 168 hours. No results for input(s): AMMONIA in the last 168 hours. CBC: Recent Labs  Lab 01/11/24 1617 01/11/24 1632 01/12/24 0438 01/13/24 0713  WBC 11.6*  --  19.6* 15.3*  NEUTROABS 8.0*  --   --   --   HGB 17.3* 18.0* 15.8* 13.7  HCT 55.6* 53.0* 48.3* 43.0  MCV 92.4  --  89.8 91.7  PLT 281  --  253 156   Cardiac Enzymes: No results for input(s): CKTOTAL, CKMB, CKMBINDEX, TROPONINI in the last 168 hours. BNP: Invalid input(s): POCBNP CBG: Recent Labs  Lab 01/12/24 1329 01/12/24 1610 01/12/24 2130 01/13/24 0608 01/13/24 1109  GLUCAP 138* 129* 119* 95 123*   D-Dimer No results for input(s): DDIMER in the last 72 hours. Hgb A1c No results for input(s): HGBA1C in the last 72 hours. Lipid Profile No results for input(s): CHOL, HDL, LDLCALC, TRIG, CHOLHDL, LDLDIRECT in the last 72 hours. Thyroid  function studies No results for input(s): TSH, T4TOTAL, T3FREE, THYROIDAB in the last 72 hours.  Invalid input(s): FREET3 Anemia work up No results for input(s): VITAMINB12, FOLATE, FERRITIN, TIBC, IRON, RETICCTPCT in the last 72 hours. Urinalysis    Component Value Date/Time   COLORURINE YELLOW 01/11/2024 1925   APPEARANCEUR HAZY (A) 01/11/2024 1925   LABSPEC 1.023 01/11/2024 1925   PHURINE 5.0 01/11/2024 1925   GLUCOSEU NEGATIVE 01/11/2024 1925   GLUCOSEU NEGATIVE 10/18/2021 1537   HGBUR NEGATIVE 01/11/2024 1925   BILIRUBINUR NEGATIVE 01/11/2024 1925   KETONESUR NEGATIVE 01/11/2024 1925   PROTEINUR 100 (A) 01/11/2024 1925   UROBILINOGEN 0.2 10/18/2021 1537   NITRITE NEGATIVE 01/11/2024 1925   LEUKOCYTESUR NEGATIVE 01/11/2024 1925   Sepsis Labs Recent Labs  Lab 01/11/24 1617 01/12/24 0438  01/13/24 0713  WBC 11.6* 19.6* 15.3*   Microbiology Recent Results (from the past 240 hours)  Resp panel by RT-PCR (RSV, Flu A&B, Covid) Anterior Nasal Swab     Status: Abnormal   Collection Time: 01/11/24  4:17 PM   Specimen: Anterior Nasal Swab  Result Value Ref Range Status   SARS Coronavirus 2 by RT PCR POSITIVE (A) NEGATIVE Final   Influenza A by PCR NEGATIVE NEGATIVE Final   Influenza B by PCR NEGATIVE NEGATIVE Final    Comment: (NOTE) The Xpert Xpress SARS-CoV-2/FLU/RSV plus assay is intended as an aid in the diagnosis of influenza from Nasopharyngeal swab specimens and should not be used as a sole basis for treatment. Nasal washings and aspirates are unacceptable for  Xpert Xpress SARS-CoV-2/FLU/RSV testing.  Fact Sheet for Patients: bloggercourse.com  Fact Sheet for Healthcare Providers: seriousbroker.it  This test is not yet approved or cleared by the [place]  FDA and has been authorized for detection and/or diagnosis of SARS-CoV-2 by FDA under an Emergency Use Authorization (EUA). This EUA will remain in effect (meaning this test can be used) for the duration of the COVID-19 declaration under Section 564(b)(1) of the Act, 21 U.S.C. section 360bbb-3(b)(1), unless the authorization is terminated or revoked.     Resp Syncytial Virus by PCR NEGATIVE NEGATIVE Final    Comment: (NOTE) Fact Sheet for Patients: bloggercourse.com  Fact Sheet for Healthcare Providers: seriousbroker.it  This test is not yet approved or cleared by the [place]  FDA and has been authorized for detection and/or diagnosis of SARS-CoV-2 by FDA under an Emergency Use Authorization (EUA). This EUA will remain in effect (meaning this test can be used) for the duration of the COVID-19 declaration under Section 564(b)(1) of the Act, 21 U.S.C. section 360bbb-3(b)(1), unless the authorization  is terminated or revoked.  Performed at [facility], [address] [place], [place] [address]   Blood Culture (routine x 2)     Status: None (Preliminary result)   Collection Time: 01/11/24  4:30 PM   Specimen: BLOOD LEFT HAND  Result Value Ref Range Status   Specimen Description BLOOD LEFT HAND  Final   Special Requests   Final    BOTTLES DRAWN AEROBIC ONLY Blood Culture results may not be optimal due to an inadequate volume of blood received in culture bottles   Culture   Final    NO GROWTH 3 DAYS Performed at [facility], [address] [place], [place] [address]    Report Status PENDING  Incomplete  Blood Culture (routine x 2)     Status: None (Preliminary result)   Collection Time: 01/11/24  6:30 PM   Specimen: BLOOD  Result Value Ref Range Status   Specimen Description BLOOD SITE NOT SPECIFIED  Final   Special Requests   Final    BOTTLES DRAWN AEROBIC AND ANAEROBIC Blood Culture results may not be optimal due to an inadequate volume of blood received in culture bottles   Culture   Final    NO GROWTH 3 DAYS Performed at [facility], [address], [place], [place] [address]    Report Status PENDING  Incomplete    Please note: You were cared for by a hospitalist during your hospital stay. Once you are discharged, your primary care physician will handle any further medical issues. Please note that NO REFILLS for any discharge medications will be authorized once you are discharged, as it is imperative that you return to your primary care physician (or establish a relationship with a primary care physician if you do not have one) for your post hospital discharge needs so that they can reassess your need for medications and monitor your lab values.    Time coordinating discharge: 40 minutes  SIGNED:   Ivonne Mustache, MD  [facility] 01/14/2024, 9:31 AM Pager [phone]  If 7PM-7AM, please contact night-coverage [email or web address] Password  [email or web address]

## 2024-01-14 NOTE — Progress Notes (Signed)
 Called report to [place], Charity fundraiser at [facility]. Placed pt's d/c paperwork in the envelop.   Lawson Radar, RN

## 2024-01-14 NOTE — Care Management Important Message (Signed)
 Important Message  Patient Details  Name: Dana Burns MRN: [id number] Date of Birth: [birth year]   Important Message Given:  Yes - Medicare IM     Renie Ora 01/14/2024, 10:22 AM

## 2024-01-14 NOTE — Progress Notes (Signed)
   Palliative Medicine Inpatient Follow Up Note HPI: Patient is a 89 year old female with history of depression, anxiety, diabetes, hypertension, hyperlipidemia, A-fib on Eliquis , CKD stage IIIa, dementia who was brought to the emergency department after she was found to be unresponsive at home by her daughter.  Patient developed fatigue, cough, malaise for almost a month.  She was diagnosed with COVID on 1/1. Palliative care asked to get involved for further goals of care conversations in the setting of adult FTT and profound weakness.   Today's Discussion 01/14/2024  *Please note that this is a verbal dictation therefore any spelling or grammatical errors are due to the Dragon Medical One system interpretation.  Chart reviewed inclusive of vital signs, progress notes, laboratory results, and diagnostic images. (+) 2 doses of dilaudid  in the last 24 hours.   I met with Dana Burns Burns at bedside she was joined by patients daughter, Dana Burns Burns, grandson, Dana Burns Burns, and friend, Dana Burns Burns.   We reviewed that patient had a poor night sleep. Discussed that she has been comfortable and medications have been requested as needed. Reviewed that the family is in agreement with a full comfort emphasis and stopping antibiotics.  [name] has approved the patient and family is looking forward to transfer as early as today. We discussed how the hospice home is a therapeutic environment for this phase of Dana Burns Burns' time and allowing her a peaceful transition from this life to the next.   Questions and concerns addressed/Palliative Support Provided.   Objective Assessment: Vital Signs Vitals:   01/13/24 2222 01/14/24 0743  BP: 120/81 127/84  Pulse: 99   Resp: 18 19  Temp: 97.8 F (36.6 C) 97.7 F (36.5 C)  SpO2: 98% 100%    Intake/Output Summary (Last 24 hours) at 01/14/2024 0845 Last data filed at 01/14/2024 0554 Gross per 24 hour  Intake 350 ml  Output 800 ml  Net -450 ml   Last Weight  Most recent update:  01/13/2024  1:18 AM    Weight  53.7 kg (118 lb 6.2 oz)            Gen:  Frail elderly Caucasian F in NAD HEENT: Dry mucous membranes CV: Regular rate and rhythm  PULM: On 2LPM [place], breathing is even and nonlabored  ABD: soft/nontender  EXT: Cool distal extremities Neuro: Somnolent  SUMMARY OF RECOMMENDATIONS   DNAR/DNI  Antibiotics have been stopped  Comfort measures   Additional medications per [name] - Dilaudid  dosing appears appropriate at this time  Allow patient a natural passing  Appreciate evaluation by [name] - Plan for transition today  Billing based on MDM: High - Review of controlled substances for comfort measures.  ______________________________________________________________________________________ Dana Burns Burns Palliative Medicine Team Team Cell Phone: [phone] Please utilize secure chat with additional questions, if there is no response within 30 minutes please call the above phone number  Palliative Medicine Team providers are available by phone from 7am to 7pm daily and can be reached through the team cell phone.  Should this patient require assistance outside of these hours, please call the patient's attending physician.

## 2024-01-14 NOTE — TOC Transition Note (Signed)
 Transition of Care (TOC) - Discharge Note Rayfield Gobble RN, BSN Transitions of Care Unit 4E- RN Case Manager See Treatment Team for direct phone #   Patient Details  Name: Dana Burns MRN: [id number] Date of Birth: [birth year]  Transition of Care [facility]) CM/SW Contact:  Gobble Rayfield Hurst, RN Phone Number: 01/14/2024, 11:16 AM   Clinical Narrative:    Per PC- family has decided on Comfort Care and requested referral for [facility].  [facility] liaison has confirmed that [name] has bed available today for admit and family has completed needed consents for transition to [facility].   [facility] has been called to schedule transport for 11am as per request of [name]. Bedside RN to call report to [facility] at - [phone] prior to transport   Paperwork for [facility] along with GOLD DNR placed on shadow chart.   No further TOC needs noted.     Final next level of care: Hospice Medical Facility Barriers to Discharge: No Barriers Identified   Patient Goals and CMS Choice Patient states their goals for this hospitalization and ongoing recovery are:: Comfort Care- [facility] CMS Medicare.gov Compare Post Acute Care list provided to:: Patient Represenative (must comment) Choice offered to / list presented to : Adult Children      Discharge Placement               INPT Hospice- [name]        Discharge Plan and Services Additional resources added to the After Visit Summary for     Discharge Planning Services: CM Consult Post Acute Care Choice: Hospice          DME Arranged: N/A DME Agency: NA       HH Arranged: NA HH Agency: NA        Social Drivers of Health (SDOH) Interventions SDOH Screenings   Food Insecurity: No Food Insecurity (08/09/2023)  Housing: Low Risk  (04/18/2023)  Transportation Needs: No Transportation Needs (08/09/2023)  Utilities: Not At Risk (03/28/2023)  Alcohol  Screen: Low Risk  (03/26/2023)  Depression  (PHQ2-9): Low Risk  (03/26/2023)  Financial Resource Strain: Low Risk  (03/01/2022)  Physical Activity: Inactive (03/01/2022)  Social Connections: Socially Isolated (03/26/2023)  Stress: No Stress Concern Present (03/01/2022)  Tobacco Use: Low Risk  (01/11/2024)     Readmission Risk Interventions    01/14/2024   11:16 AM  Readmission Risk Prevention Plan  Transportation Screening Complete  HRI or Home Care Consult Complete  Palliative Care Screening Complete  Medication Review (RN Care Manager) Complete

## 2024-01-16 LAB — CULTURE, BLOOD (ROUTINE X 2)
Culture: NO GROWTH
Culture: NO GROWTH

## 2024-01-20 ENCOUNTER — Telehealth: Payer: Self-pay | Admitting: Family Medicine

## 2024-02-01 NOTE — Telephone Encounter (Signed)
Copied from CRM [id number]. Topic: General - Deceased Patient >> 02/10/2024  9:19 AM Dennison Nancy wrote: Name of caller: Laqueta Due (Daughter) [phone]  Date of death: 02-10-2024   Name of funeral home: [address] [place] ,  [place] [facility] in [place]  Phone number of funeral home: [phone]   Provider that needs to sign form: n/a  Timeline for signing: n/a

## 2024-02-01 DEATH — deceased

## 2024-02-20 ENCOUNTER — Ambulatory Visit: Payer: Medicare Other | Admitting: Family Medicine

## 2024-03-05 ENCOUNTER — Ambulatory Visit: Payer: Medicare Other | Admitting: Cardiology
